# Patient Record
Sex: Female | Born: 1960 | Race: Black or African American | Hispanic: No | Marital: Married | Smoking: Never smoker
Health system: Southern US, Community
[De-identification: ages and names within clinical notes are randomized; demographics above are authoritative.]

## PROBLEM LIST (undated history)

## (undated) DIAGNOSIS — F419 Anxiety disorder, unspecified: Secondary | ICD-10-CM

## (undated) DIAGNOSIS — M1990 Unspecified osteoarthritis, unspecified site: Secondary | ICD-10-CM

## (undated) DIAGNOSIS — M722 Plantar fascial fibromatosis: Secondary | ICD-10-CM

## (undated) DIAGNOSIS — T884XXA Failed or difficult intubation, initial encounter: Secondary | ICD-10-CM

## (undated) DIAGNOSIS — I2699 Other pulmonary embolism without acute cor pulmonale: Secondary | ICD-10-CM

## (undated) DIAGNOSIS — K219 Gastro-esophageal reflux disease without esophagitis: Secondary | ICD-10-CM

## (undated) DIAGNOSIS — Z8489 Family history of other specified conditions: Secondary | ICD-10-CM

## (undated) DIAGNOSIS — R7303 Prediabetes: Secondary | ICD-10-CM

## (undated) DIAGNOSIS — F329 Major depressive disorder, single episode, unspecified: Secondary | ICD-10-CM

## (undated) DIAGNOSIS — E669 Obesity, unspecified: Secondary | ICD-10-CM

## (undated) DIAGNOSIS — I272 Pulmonary hypertension, unspecified: Secondary | ICD-10-CM

## (undated) DIAGNOSIS — K5792 Diverticulitis of intestine, part unspecified, without perforation or abscess without bleeding: Secondary | ICD-10-CM

## (undated) DIAGNOSIS — J189 Pneumonia, unspecified organism: Secondary | ICD-10-CM

## (undated) DIAGNOSIS — R0600 Dyspnea, unspecified: Secondary | ICD-10-CM

## (undated) DIAGNOSIS — Z973 Presence of spectacles and contact lenses: Secondary | ICD-10-CM

## (undated) DIAGNOSIS — Z95828 Presence of other vascular implants and grafts: Secondary | ICD-10-CM

## (undated) DIAGNOSIS — I509 Heart failure, unspecified: Secondary | ICD-10-CM

## (undated) DIAGNOSIS — F32A Depression, unspecified: Secondary | ICD-10-CM

## (undated) DIAGNOSIS — E785 Hyperlipidemia, unspecified: Secondary | ICD-10-CM

## (undated) DIAGNOSIS — I10 Essential (primary) hypertension: Secondary | ICD-10-CM

## (undated) DIAGNOSIS — D649 Anemia, unspecified: Secondary | ICD-10-CM

## (undated) DIAGNOSIS — G4730 Sleep apnea, unspecified: Secondary | ICD-10-CM

## (undated) HISTORY — PX: FRACTURE SURGERY: SHX138

## (undated) HISTORY — DX: Depression, unspecified: F32.A

## (undated) HISTORY — PX: HEMORROIDECTOMY: SUR656

## (undated) HISTORY — PX: COLONOSCOPY W/ BIOPSIES AND POLYPECTOMY: SHX1376

## (undated) HISTORY — PX: FOOT SURGERY: SHX648

## (undated) HISTORY — PX: ABDOMINAL HYSTERECTOMY: SHX81

## (undated) HISTORY — PX: WISDOM TOOTH EXTRACTION: SHX21

## (undated) HISTORY — PX: EMBOLECTOMY: SHX44

## (undated) HISTORY — PX: BREAST REDUCTION SURGERY: SHX8

## (undated) HISTORY — PX: REDUCTION MAMMAPLASTY: SUR839

## (undated) HISTORY — DX: Major depressive disorder, single episode, unspecified: F32.9

## (undated) HISTORY — PX: IVC FILTER INSERTION: CATH118245

## (undated) HISTORY — PX: KNEE ARTHROSCOPY W/ DEBRIDEMENT: SHX1867

## (undated) HISTORY — PX: CHOLECYSTECTOMY: SHX55

---

## 2015-11-12 ENCOUNTER — Emergency Department (HOSPITAL_COMMUNITY): Payer: Self-pay

## 2015-11-12 ENCOUNTER — Encounter (HOSPITAL_COMMUNITY): Payer: Self-pay | Admitting: *Deleted

## 2015-11-12 ENCOUNTER — Emergency Department (HOSPITAL_COMMUNITY)
Admission: EM | Admit: 2015-11-12 | Discharge: 2015-11-13 | Disposition: A | Discharge status: Home / Self Care | Payer: Self-pay | Attending: Emergency Medicine | Admitting: Emergency Medicine

## 2015-11-12 DIAGNOSIS — Z8739 Personal history of other diseases of the musculoskeletal system and connective tissue: Secondary | ICD-10-CM | POA: Insufficient documentation

## 2015-11-12 DIAGNOSIS — T45515A Adverse effect of anticoagulants, initial encounter: Secondary | ICD-10-CM

## 2015-11-12 DIAGNOSIS — Z8719 Personal history of other diseases of the digestive system: Secondary | ICD-10-CM | POA: Insufficient documentation

## 2015-11-12 DIAGNOSIS — F419 Anxiety disorder, unspecified: Secondary | ICD-10-CM | POA: Insufficient documentation

## 2015-11-12 DIAGNOSIS — Z7901 Long term (current) use of anticoagulants: Secondary | ICD-10-CM | POA: Insufficient documentation

## 2015-11-12 DIAGNOSIS — R079 Chest pain, unspecified: Secondary | ICD-10-CM

## 2015-11-12 DIAGNOSIS — I2699 Other pulmonary embolism without acute cor pulmonale: Secondary | ICD-10-CM | POA: Insufficient documentation

## 2015-11-12 DIAGNOSIS — Z8701 Personal history of pneumonia (recurrent): Secondary | ICD-10-CM | POA: Insufficient documentation

## 2015-11-12 DIAGNOSIS — E669 Obesity, unspecified: Secondary | ICD-10-CM | POA: Insufficient documentation

## 2015-11-12 DIAGNOSIS — Z7951 Long term (current) use of inhaled steroids: Secondary | ICD-10-CM | POA: Insufficient documentation

## 2015-11-12 DIAGNOSIS — Z79899 Other long term (current) drug therapy: Secondary | ICD-10-CM | POA: Insufficient documentation

## 2015-11-12 DIAGNOSIS — R42 Dizziness and giddiness: Secondary | ICD-10-CM | POA: Insufficient documentation

## 2015-11-12 HISTORY — DX: Unspecified osteoarthritis, unspecified site: M19.90

## 2015-11-12 HISTORY — DX: Other pulmonary embolism without acute cor pulmonale: I26.99

## 2015-11-12 HISTORY — DX: Diverticulitis of intestine, part unspecified, without perforation or abscess without bleeding: K57.92

## 2015-11-12 HISTORY — DX: Essential (primary) hypertension: I10

## 2015-11-12 HISTORY — DX: Obesity, unspecified: E66.9

## 2015-11-12 HISTORY — DX: Pneumonia, unspecified organism: J18.9

## 2015-11-12 LAB — BASIC METABOLIC PANEL
Anion gap: 10 (ref 5–15)
BUN: 18 mg/dL (ref 6–20)
CO2: 27 mmol/L (ref 22–32)
Calcium: 10.1 mg/dL (ref 8.9–10.3)
Chloride: 106 mmol/L (ref 101–111)
Creatinine, Ser: 1.11 mg/dL — ABNORMAL HIGH (ref 0.44–1.00)
GFR calc Af Amer: >60 mL/min (ref >60)
GFR calc non Af Amer: 56 mL/min — ABNORMAL LOW (ref >60)
Glucose, Bld: 98 mg/dL (ref 65–99)
Potassium: 4.1 mmol/L (ref 3.5–5.1)
Sodium: 143 mmol/L (ref 135–145)

## 2015-11-12 LAB — CBC
HCT: 36.8 % (ref 36.0–46.0)
Hemoglobin: 12 g/dL (ref 12.0–15.0)
MCH: 22.1 pg — ABNORMAL LOW (ref 26.0–34.0)
MCHC: 32.6 g/dL (ref 30.0–36.0)
MCV: 67.8 fL — ABNORMAL LOW (ref 78.0–100.0)
Platelets: 350 10*3/uL (ref 150–400)
RBC: 5.43 MIL/uL — ABNORMAL HIGH (ref 3.87–5.11)
RDW: 17.6 % — ABNORMAL HIGH (ref 11.5–15.5)
WBC: 9.2 10*3/uL (ref 4.0–10.5)

## 2015-11-12 LAB — PROTIME-INR
INR: 1.34 (ref 0.00–1.49)
Prothrombin Time: 16.7 seconds — ABNORMAL HIGH (ref 11.6–15.2)

## 2015-11-12 LAB — I-STAT TROPONIN, ED: Troponin i, poc: 0 ng/mL (ref 0.00–0.08)

## 2015-11-12 MED ORDER — ONDANSETRON HCL 4 MG/2ML IJ SOLN
4.0000 mg | INTRAMUSCULAR | Status: AC
  Administered 2015-11-12: 4 mg via INTRAVENOUS
  Filled 2015-11-12: qty 2

## 2015-11-12 MED ORDER — SODIUM CHLORIDE 0.9 % IV BOLUS (SEPSIS)
500.0000 mL | Freq: Once | INTRAVENOUS | Status: AC
  Administered 2015-11-12: 500 mL via INTRAVENOUS

## 2015-11-12 MED ORDER — ACETAMINOPHEN 325 MG PO TABS
650.0000 mg | ORAL_TABLET | Freq: Once | ORAL | Status: AC
  Administered 2015-11-12: 650 mg via ORAL
  Filled 2015-11-12: qty 2

## 2015-11-12 MED ORDER — MECLIZINE HCL 25 MG PO TABS
25.0000 mg | ORAL_TABLET | Freq: Once | ORAL | Status: AC
  Administered 2015-11-12: 25 mg via ORAL
  Filled 2015-11-12: qty 1

## 2015-11-12 NOTE — ED Notes (Signed)
Patient returned from XRay

## 2015-11-12 NOTE — ED Provider Notes (Signed)
CSN: [id number]     Arrival date & time 11/12/15  1639 History   First MD Initiated Contact with Patient 11/12/15 1730     Chief Complaint  Patient presents with  . Chest Pain  . Shortness of Breath   [name] [name] is a 55 y.o. female who presents to the emergency department complaining of feeling lightheaded and dizzy as well as worsening chest pain and shortness of breath or the past 4 days. The patient was diagnosed with bilateral PEs in January 2017 at [facility]. She has been taking Xarelto. She recently moved to the area. She reports she's been having some chronic right and left-sided chest pain. It is worse on her right side. She reports over the past 4 days this pain has worsened. She reports increased stress recently. No new chest pain only worsening chest pain. She reports feeling slightly short of breath and has had continued productive cough. No hemoptysis. She has been compliant on Xarelto. She reports her right-sided chest pain is worse with movement of her arms and lifting her body. Patient also reports having increasing lightheadedness and dizziness that is worse with position change. She reports she feels she cannot bend over without feeling very lightheaded. She reports feeling off balance when she is walking. No headache. She denies fevers, headache, changes to her vision, head injury, falls, abdominal pain, vomiting, diarrhea, numbness, tingling, weakness.    Patient is a 55 y.o. female presenting with chest pain and shortness of breath. The history is provided by the patient. No language interpreter was used.  Chest Pain Associated symptoms: cough, dizziness and shortness of breath   Associated symptoms: no abdominal pain, no back pain, no fever, no headache, no nausea, no numbness, no palpitations, not vomiting and no weakness   Shortness of Breath Associated symptoms: chest pain and cough   Associated symptoms: no abdominal pain, no fever, no headaches, no  neck pain, no rash, no sore throat, no vomiting and no wheezing     Past Medical History  Diagnosis Date  . Pulmonary embolism ([place])   . Hypertension   . Obesity   . Arthritis   . Pneumonia   . Diverticulitis    Past Surgical History  Procedure Laterality Date  . Cholecystectomy    . Breast reduction surgery    . Abdominal hysterectomy     History reviewed. No pertinent family history. Social History  Substance Use Topics  . Smoking status: Never Smoker   . Smokeless tobacco: None  . Alcohol Use: No   OB History    No data available     Review of Systems  Constitutional: Negative for fever and chills.  HENT: Negative for congestion and sore throat.   Eyes: Negative for visual disturbance.  Respiratory: Positive for cough and shortness of breath. Negative for wheezing.   Cardiovascular: Positive for chest pain. Negative for palpitations and leg swelling.  Gastrointestinal: Negative for nausea, vomiting, abdominal pain and diarrhea.  Genitourinary: Negative for dysuria, hematuria and difficulty urinating.  Musculoskeletal: Negative for back pain, neck pain and neck stiffness.  Skin: Negative for rash.  Neurological: Positive for dizziness and light-headedness. Negative for syncope, speech difficulty, weakness, numbness and headaches.      Allergies  Review of patient's allergies indicates no known allergies.  Home Medications   Prior to Admission medications   Medication Sig Start Date End Date Taking? Authorizing Provider  albuterol (PROVENTIL HFA;VENTOLIN HFA) 108 (90 Base) MCG/ACT inhaler Inhale 1-2 puffs into  the lungs every 6 (six) hours as needed for wheezing or shortness of breath.   Yes Historical Provider, MD  ALPRAZolam Duanne Moron) 1 MG tablet Take 1 mg by mouth 2 (two) times daily as needed for anxiety or sleep.   Yes Historical Provider, MD  atenolol (TENORMIN) 100 MG tablet Take 100 mg by mouth daily.   Yes Historical Provider, MD  budesonide-formoterol  (SYMBICORT) 160-4.5 MCG/ACT inhaler Inhale 2 puffs into the lungs 2 (two) times daily.   Yes Historical Provider, MD  dicyclomine (BENTYL) 20 MG tablet Take 20 mg by mouth 2 (two) times daily.   Yes Historical Provider, MD  docusate sodium (COLACE) 100 MG capsule Take 100 mg by mouth 2 (two) times daily.   Yes Historical Provider, MD  guaiFENesin-codeine (ROBITUSSIN AC) 100-10 MG/5ML syrup Take 5 mLs by mouth 3 (three) times daily as needed for cough or congestion.   Yes Historical Provider, MD  promethazine (PHENERGAN) 25 MG tablet Take 25 mg by mouth every 6 (six) hours as needed for nausea or vomiting.   Yes Historical Provider, MD  triamterene-hydrochlorothiazide (MAXZIDE-25) 37.5-25 MG tablet Take 1 tablet by mouth daily.   Yes Historical Provider, MD  zolpidem (AMBIEN) 10 MG tablet Take 10 mg by mouth at bedtime.   Yes Historical Provider, MD  meclizine (ANTIVERT) 25 MG tablet Take 1 tablet (25 mg total) by mouth 3 (three) times daily as needed for dizziness. 11/13/15   Waynetta Pean, PA-C  rivaroxaban (XARELTO) 20 MG TABS tablet Take 1 tablet (20 mg total) by mouth daily with supper. 11/13/15   Waynetta Pean, PA-C   BP 129/70 mmHg  Pulse 54  Temp(Src) 97.6 F (36.4 C) (Oral)  Resp 16  SpO2 95% Physical Exam  Constitutional: She is oriented to person, place, and time. She appears well-developed and well-nourished. No distress.  Nontoxic appearing. Obese female.  HENT:  Head: Normocephalic and atraumatic.  Mouth/Throat: Oropharynx is clear and moist.  Eyes: Conjunctivae and EOM are normal. Pupils are equal, round, and reactive to light. Right eye exhibits no discharge. Left eye exhibits no discharge.  Neck: Normal range of motion. Neck supple. No JVD present. No tracheal deviation present.  Cardiovascular: Normal rate, regular rhythm, normal heart sounds and intact distal pulses.  Exam reveals no gallop and no friction rub.   No murmur heard. Bilateral radial and posterior tibialis  pulses are intact.  Pulmonary/Chest: Effort normal and breath sounds normal. No respiratory distress. She has no wheezes. She has no rales. She exhibits tenderness.  Lungs are clear to auscultation bilaterally. Chest wall is tender to palpation and reproduces her chest pain.   Abdominal: Soft. Bowel sounds are normal. She exhibits no distension. There is no tenderness. There is no guarding.  Musculoskeletal: She exhibits no edema or tenderness.  No lower extremity edema or tenderness.  Lymphadenopathy:    She has no cervical adenopathy.  Neurological: She is alert and oriented to person, place, and time. No cranial nerve deficit. Coordination normal.  The patient is alert and oriented 3. Cranial nerves are intact. Sensation is intact her bilateral upper and lower extremities. 5/5 strength in her bilateral upper and lower extremities. No pronator drift. Speech is clear and coherent. EOMs are intact. Vision is grossly intact. Normal gait.  Skin: Skin is warm and dry. No rash noted. She is not diaphoretic. No erythema. No pallor.  Psychiatric: Her speech is normal and behavior is normal. Her mood appears anxious.  Patient is tearful at times during interview.  She reports she is stressed that she does not have insurance in about being sick.  Nursing note and vitals reviewed.   ED Course  Procedures (including critical care time) Labs Review Labs Reviewed  BASIC METABOLIC PANEL - Abnormal; Notable for the following:    Creatinine, Ser 1.11 (*)    GFR calc non Af Amer 56 (*)    All other components within normal limits  CBC - Abnormal; Notable for the following:    RBC 5.43 (*)    MCV 67.8 (*)    MCH 22.1 (*)    RDW 17.6 (*)    All other components within normal limits  PROTIME-INR - Abnormal; Notable for the following:    Prothrombin Time 16.7 (*)    All other components within normal limits  I-STAT TROPOININ, ED    Imaging Review Dg Chest 2 View  11/12/2015  CLINICAL DATA:   Right-sided pleuritic chest pain shortness of breath for 4 days. Productive cough. Previous pulmonary embolism. Hypertension. EXAM: CHEST  2 VIEW COMPARISON:  None. FINDINGS: The heart size and mediastinal contours are within normal limits. Mild right middle lobe scarring or atelectasis is noted. No evidence of pulmonary consolidation or edema. No evidence of pneumothorax or pleural effusion. Right-sided Port-A-Cath is seen in appropriate position with tip overlying the superior cavoatrial junction. The visualized skeletal structures are unremarkable. IMPRESSION: Mild right middle lobe scarring versus atelectasis. Electronically Signed   By: Earle Gell M.D.   On: 11/12/2015 18:33   Ct Head Wo Contrast  11/12/2015  CLINICAL DATA:  Recent diagnosis of pulmonary embolus. Dizziness and lightheadedness. EXAM: CT HEAD WITHOUT CONTRAST TECHNIQUE: Contiguous axial images were obtained from the base of the skull through the vertex without intravenous contrast. COMPARISON:  None. FINDINGS: No acute cortical infarct, hemorrhage, or mass lesion ispresent. Ventricles are of normal size. No significant extra-axial fluid collection is present. The paranasal sinuses andmastoid air cells are clear. The osseous skull is intact. IMPRESSION: Negative exam. Electronically Signed   By: Kerby Moors M.D.   On: 11/12/2015 18:55   I have personally reviewed and evaluated these images and lab results as part of my medical decision-making.   EKG Interpretation   Date/Time:  Sunday November 12 2015 16:45:36 EST Ventricular Rate:  72 PR Interval:  166 QRS Duration: 72 QT Interval:  394 QTC Calculation: 431 R Axis:   41 Text Interpretation:  Normal sinus rhythm Nonspecific T wave abnormality  Abnormal ECG Confirmed by Alvino Chapel  MD, NATHAN [phone]) on 11/13/2015  1:09:04 AM      Filed Vitals:   11/12/15 2349 11/12/15 2352 11/13/15 0000 11/13/15 0015  BP: 125/86 156/96  129/70  Pulse: 66 65 55 54  Temp:      TempSrc:       Resp: 20 12 16 16   SpO2: 98% 95% 100% 95%     MDM   Meds given in ED:  Medications  sodium chloride 0.9 % bolus 500 mL (0 mLs Intravenous Stopped 11/12/15 2052)  meclizine (ANTIVERT) tablet 25 mg (25 mg Oral Given 11/12/15 1946)  acetaminophen (TYLENOL) tablet 650 mg (650 mg Oral Given 11/12/15 1946)  ondansetron (ZOFRAN) injection 4 mg (4 mg Intravenous Given 11/12/15 2055)  sodium chloride 0.9 % bolus 500 mL (0 mLs Intravenous Stopped 11/12/15 2347)  sodium chloride 0.9 % bolus 500 mL (0 mLs Intravenous Stopped 11/13/15 0056)    New Prescriptions   MECLIZINE (ANTIVERT) 25 MG TABLET    Take 1 tablet (25 mg  total) by mouth 3 (three) times daily as needed for dizziness.   RIVAROXABAN (XARELTO) 20 MG TABS TABLET    Take 1 tablet (20 mg total) by mouth daily with supper.    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 female who presents to the emergency department complaining of feeling lightheaded and dizzy as well as worsening chest pain and shortness of breath or the past 4 days. The patient was diagnosed with bilateral PEs in January 2017 at [facility]. She has been taking Xarelto. She recently moved to the area. She reports she's been having some chronic right and left-sided chest pain. It is worse on her right side. She reports over the past 4 days this pain has worsened. She reports increased stress recently. No new chest pain only worsening chest pain. She reports feeling slightly short of breath and has had continued productive cough. No hemoptysis. She has been compliant on Xarelto. She reports her right-sided chest pain is worse with movement of her arms and lifting her body. Patient also reports having increasing lightheadedness and dizziness that is worse with position change. She reports she feels she cannot bend over without feeling very lightheaded. On exam  the patient is afebrile and nontoxic appearing. Her lungs are clear to auscultation bilaterally. She has chest wall tenderness to palpation which reproduces her chest pain. She has no focal neurological deficits. Patient is tearful at times. She reports increased stress about her medical care. She is soon to be out of her Xarelto and does not have primary care or new prescription. She's been taking 15 mg twice a day for more than a month now. I believe the patient should be on 20 mg once daily. This 15 mg BID dosing was started by the hospital in [facility]. Troponin is 0. No need for delta troponin is a patient has been having chronic ongoing chest pain that is unchanged from her PE diagnosis. BMP is remarkable only for creatinine of 1.11 with a GFR greater than 60. CBC is unremarkable. CT head is unremarkable. Chest x-ray shows right middle lobe scarring versus atelectasis. I suspect this is related to the patient's chronic PE. Patient's oxygen saturation is 100% on room air. She is not tachypneic or tachycardic. After a discussion with my attending, we see no need for repeat CT A at this time with her chronic on going chest pain while on xarelto. After fluid bolus and meclizine patient reported feeling much better. She is ambulatory with normal gait the bathroom several times. She is not orthostatic. We suspect vertigo today. Will discharge with prescription for meclizine. I left a message with care management to help her get set up at [facility] and to help her fill her prescriptions for Xarelto. I provided her with a prescription for Xarelto 20 mg as the patient reports she is soon to run out of her previous prescription. I advised her to call the wellness Center for follow-up. I discussed strict and specific return precautions. I advised the patient to follow-up with their primary care provider this week. I advised the patient to return to the emergency department with new or worsening symptoms or  new concerns. The patient verbalized understanding and agreement with plan.    This patient was discussed with Dr. Rogene Houston who agrees with assessment and plan.    Waynetta Pean, PA-C 11/13/15 0118  Fredia Sorrow,  MD 11/15/15 [phone]

## 2015-11-12 NOTE — ED Notes (Signed)
Power[employer] access kit ordered to access port.

## 2015-11-12 NOTE — ED Notes (Addendum)
Pt reports chest pain, sob and dizziness that has been getting worse x 4 days. Pt reports recently being diagnosed with PEs. Ekg done and airway intact. Pt has port and wants it accessed for blood work.

## 2015-11-13 MED ORDER — RIVAROXABAN 20 MG PO TABS: 20.0000 mg | ORAL_TABLET | Freq: Every day | ORAL | Status: DC

## 2015-11-13 MED ORDER — HEPARIN SOD (PORK) LOCK FLUSH 100 UNIT/ML IV SOLN
500.0000 [IU] | Freq: Once | INTRAVENOUS | Status: AC
  Administered 2015-11-13: 500 [IU]
  Filled 2015-11-13: qty 5

## 2015-11-13 MED ORDER — MECLIZINE HCL 25 MG PO TABS: 25.0000 mg | ORAL_TABLET | Freq: Three times a day (TID) | ORAL | Status: DC | PRN

## 2015-11-13 NOTE — Discharge Instructions (Signed)
Benign Positional Vertigo °Vertigo is the feeling that you or your surroundings are moving when they are not. Benign positional vertigo is the most common form of vertigo. The cause of this condition is not serious (is benign). This condition is triggered by certain movements and positions (is positional). This condition can be dangerous if it occurs while you are doing something that could endanger you or others, such as driving.  °CAUSES °In many cases, the cause of this condition is not known. It may be caused by a disturbance in an area of the inner ear that helps your brain to sense movement and balance. This disturbance can be caused by a viral infection (labyrinthitis), head injury, or repetitive motion. °RISK FACTORS °This condition is more likely to develop in: °· Women. °· People who are 50 years of age or older. °SYMPTOMS °Symptoms of this condition usually happen when you move your head or your eyes in different directions. Symptoms may start suddenly, and they usually last for less than a minute. Symptoms may include: °· Loss of balance and falling. °· Feeling like you are spinning or moving. °· Feeling like your surroundings are spinning or moving. °· Nausea and vomiting. °· Blurred vision. °· Dizziness. °· Involuntary eye movement (nystagmus). °Symptoms can be mild and cause only slight annoyance, or they can be severe and interfere with daily life. Episodes of benign positional vertigo may return (recur) over time, and they may be triggered by certain movements. Symptoms may improve over time. °DIAGNOSIS °This condition is usually diagnosed by medical history and a physical exam of the head, neck, and ears. You may be referred to a health care provider who specializes in ear, nose, and throat (ENT) problems (otolaryngologist) or a provider who specializes in disorders of the nervous system (neurologist). You may have additional testing, including: °· MRI. °· A CT scan. °· Eye movement tests. Your  health care provider may ask you to change positions quickly while he or she watches you for symptoms of benign positional vertigo, such as nystagmus. Eye movement may be tested with an electronystagmogram (ENG), caloric stimulation, the Dix-Hallpike test, or the roll test. °· An electroencephalogram (EEG). This records electrical activity in your brain. °· Hearing tests. °TREATMENT °Usually, your health care provider will treat this by moving your head in specific positions to adjust your inner ear back to normal. Surgery may be needed in severe cases, but this is rare. In some cases, benign positional vertigo may resolve on its own in 2-4 weeks. °HOME CARE INSTRUCTIONS °Safety °· Move slowly. Avoid sudden body or head movements. °· Avoid driving. °· Avoid operating heavy machinery. °· Avoid doing any tasks that would be dangerous to you or others if a vertigo episode would occur. °· If you have trouble walking or keeping your balance, try using a cane for stability. If you feel dizzy or unstable, sit down right away. °· Return to your normal activities as told by your health care provider. Ask your health care provider what activities are safe for you. °General Instructions °· Take over-the-counter and prescription medicines only as told by your health care provider. °· Avoid certain positions or movements as told by your health care provider. °· Drink enough fluid to keep your urine clear or pale yellow. °· Keep all follow-up visits as told by your health care provider. This is important. °SEEK MEDICAL CARE IF: °· You have a fever. °· Your condition gets worse or you develop new symptoms. °· Your family or friends   notice any behavioral changes.  Your nausea or vomiting gets worse.  You have numbness or a "pins and needles" sensation. SEEK IMMEDIATE MEDICAL CARE IF:  You have difficulty speaking or moving.  You are always dizzy.  You faint.  You develop severe headaches.  You have weakness in your  legs or arms.  You have changes in your hearing or vision.  You develop a stiff neck.  You develop sensitivity to light.   This information is not intended to replace advice given to you by your health care provider. Make sure you discuss any questions you have with your health care provider.   Document Released: 06/03/2006 Document Revised: 05/17/2015 Document Reviewed: 12/19/2014 Elsevier Interactive Patient Education 2016 [employer] Pulmonary Embolism A pulmonary embolism (PE) is a sudden blockage or decrease of blood flow in one lung or both lungs. Most blockages come from a blood clot that travels from the legs or the pelvis to the lungs. PE is a dangerous and potentially life-threatening condition if it is not treated right away. CAUSES A pulmonary embolism occurs most commonly when a blood clot travels from one of your veins to your lungs. Rarely, PE is caused by air, fat, amniotic fluid, or part of a tumor traveling through your veins to your lungs. RISK FACTORS A PE is more likely to develop in:  People who smoke.  People who areolder, especially over 8 years of age.  People who are overweight (obese).  People who sit or lie still for a long time, such as during long-distance travel (over 4 hours), bed rest, hospitalization, or during recovery from certain medical conditions like a stroke.  People who do not engage in much physical activity (sedentary lifestyle).  People who have chronic breathing disorders.  People whohave a personal or family history of blood clots or blood clotting disease.  People whohave peripheral vascular disease (PVD), diabetes, or some types of cancer.  People who haveheart disease, especially if the person had a recent heart attack or has congestive heart failure.  People who have neurological diseases that affect the legs (leg paresis).  People who have had a traumatic injury, such as breaking a hip or leg.  People whohave  recently had major or lengthy surgery, especially on the hip, knee, or abdomen.  People who have hada central line placed inside a large vein.  People who takemedicines that contain the hormone estrogen. These include birth control pills and hormone replacement therapy.  Pregnancy or during childbirth or the postpartum period. SIGNS AND SYMPTOMS  The symptoms of a PE usually start suddenly and include:  Shortness of breath while active or at rest.  Coughing or coughing up blood or blood-tinged mucus.  Chest pain that is often worse with deep breaths.  Rapid or irregular heartbeat.  Feeling light-headed or dizzy.  Fainting.  Feelinganxious.  Sweating. There may also be pain and swelling in a leg if that is where the blood clot started. These symptoms may represent a serious problem that is an emergency. Do not wait to see if the symptoms will go away. Get medical help right away. Call your local emergency services (911 in the [place] Do not drive yourself to the hospital. DIAGNOSIS Your health care provider will take a medical history and perform a physical exam. You may also have other tests, including:  Blood tests to assess the clotting properties of your blood, assess oxygen levels in your blood, and find blood clots.  Imaging tests, such as CT,  ultrasound, MRI, X-ray, and other tests to see if you have clots anywhere in your body.  An electrocardiogram (ECG) to look for heart strain from blood clots in the lungs. TREATMENT The main goals of PE treatment are:  To stop a blood clot from growing larger.  To stop new blood clots from forming. The type of treatment that you receive depends on many factors, such as the cause of your PE, your risk for bleeding or developing more clots, and other medical conditions that you have. Sometimes, a combination of treatments is necessary. This condition may be treated with:  Medicines, including newer oral blood thinners  (anticoagulants), warfarin, low molecular weight heparins, thrombolytics, or heparins.  Wearing compression stockings or using different types of devices.  Surgery (rare) to remove the blood clot or to place a filter in your abdomen to stop the blood clot from traveling to your lungs. Treatments for a PE are often divided into immediate treatment, long-term treatment (up to 3 months after PE), and extended treatment (more than 3 months after PE). Your treatment may continue for several months. This is called maintenance therapy, and it is used to prevent the forming of new blood clots. You can work with your health care provider to choose the treatment program that is best for you. What are anticoagulants? Anticoagulants are medicines that treat PEs. They can stop current blood clots from growing and stop new clots from forming. They cannot dissolve existing clots. Your body dissolves clots by itself over time. Anticoagulants are given by mouth, by injection, or through an IV tube. What are thrombolytics? Thrombolytics are clot-dissolving medicines that are used to dissolve a PE. They carry a high risk of bleeding, so they tend to be used only in severe cases or if you have very low blood pressure. HOME CARE INSTRUCTIONS If you are taking a newer oral anticoagulant:  Take the medicine every single day at the same time each day.  Understand what foods and drugs interact with this medicine.  Understand that there are no regular blood tests required when using this medicine.  Understandthe side effects of this medicine, including excessive bruising or bleeding. Ask your health care provider or pharmacist about other possible side effects. If you are taking warfarin:  Understand how to take warfarin and know which foods can affect how warfarin works in Veterinary surgeon.  Understand that it is dangerous to taketoo much or too little warfarin. Too much warfarin increases the risk of bleeding. Too  little warfarin continues to allow the risk for blood clots.  Follow your PT and INR blood testing schedule. The PT and INR results allow your health care provider to adjust your dose of warfarin. It is very important that you have your PT and INR tested as often as told by your health care provider.  Avoid major changes in your diet, or tell your health care provider before you change your diet. Arrange a visit with a registered dietitian to answer your questions. Many foods, especially foods that are high in vitamin K, can interfere with warfarin and affect the PT and INR results. Eat a consistent amount of foods that are high in vitamin K, such as:  Spinach, kale, broccoli, cabbage, collard greens, turnip greens, Brussels sprouts, peas, cauliflower, seaweed, and parsley.  Beef liver and pork liver.  Green tea.  Soybean oil.  Tell your health care provider about any and all medicines, vitamins, and supplements that you take, including aspirin and other over-the-counter anti-inflammatory  medicines. Be especially cautious with aspirin and anti-inflammatory medicines. Do not take those before you ask your health care provider if it is safe to do so. This is important because many medicines can interfere with warfarin and affect the PT and INR results.  Do not start or stop taking any over-the-counter or prescription medicine unless your health care provider or pharmacist tells you to do so. If you take warfarin, you will also need to do these things:  Hold pressure over cuts for longer than usual.  Tell your dentist and other health care providers that you are taking warfarin before you have any procedures in which bleeding may occur.  Avoid alcohol or drink very small amounts. Tell your health care provider if you change your alcohol intake.  Do not use tobacco products, including cigarettes, chewing tobacco, and e-cigarettes. If you need help quitting, ask your health care  provider.  Avoid contact sports. General Instructions  Take over-the-counter and prescription medicines only as told by your health care provider. Anticoagulant medicines can have side effects, including easy bruising and difficulty stopping bleeding. If you are prescribed an anticoagulant, you will also need to do these things:  Hold pressure over cuts for longer than usual.  Tell your dentist and other health care providers that you are taking anticoagulants before you have any procedures in which bleeding may occur.  Avoid contact sports.  Wear a medical alert bracelet or carry a medical alert card that says you have had a PE.  Ask your health care provider how soon you can go back to your normal activities. Stay active to prevent new blood clots from forming.  Make sure to exercise while traveling or when you have been sitting or standing for a long period of time. It is very important to exercise. Exercise your legs by walking or by tightening and relaxing your leg muscles often. Take frequent walks.  Wear compression stockings as told by your health care provider to help prevent more blood clots from forming.  Do not use tobacco products, including cigarettes, chewing tobacco, and e-cigarettes. If you need help quitting, ask your health care provider.  Keep all follow-up appointments with your health care provider. This is important. PREVENTION Take these actions to decrease your risk of developing another PE:  Exercise regularly. For at least 30 minutes every day, engage in:  Activity that involves moving your arms and legs.  Activity that encourages good blood flow through your body by increasing your heart rate.  Exercise your arms and legs every hour during long-distance travel (over 4 hours). Drink plenty of water and avoid drinking alcohol while traveling.  Avoid sitting or lying in bed for long periods of time without moving your legs.  Maintain a weight that is  appropriate for your height. Ask your health care provider what weight is healthy for you.  If you are a woman who is over 75 years of age, avoid unnecessary use of medicines that contain estrogen. These include birth control pills.  Do not smoke, especially if you take estrogen medicines. If you need help quitting, ask your health care provider.  If you are at very high risk for PE, wear compression stockings.  If you recently had a PE, have regularly scheduled ultrasound testing on your legs to check for new blood clots. If you are hospitalized, prevention measures may include:  Early walking after surgery, as soon as your health care provider says that it is safe.  Receiving anticoagulants to  prevent blood clots. If you cannot take anticoagulants, other options may be available, such as wearing compression stockings or using different types of devices. SEEK IMMEDIATE MEDICAL CARE IF:  You have new or increased pain, swelling, or redness in an arm or leg.  You have numbness or tingling in an arm or leg.  You have shortness of breath while active or at rest.  You have chest pain.  You have a rapid or irregular heartbeat.  You feel light-headed or dizzy.  You cough up blood.  You notice blood in your vomit, bowel movement, or urine.  You have a fever. These symptoms may represent a serious problem that is an emergency. Do not wait to see if the symptoms will go away. Get medical help right away. Call your local emergency services (911 in the [place] Do not drive yourself to the hospital.   This information is not intended to replace advice given to you by your health care provider. Make sure you discuss any questions you have with your health care provider.   Document Released: 08/23/2000 Document Revised: 05/17/2015 Document Reviewed: 12/21/2014 Elsevier Interactive Patient Education 2016 Elsevier Inc. Nonspecific Chest Pain  Chest pain can be caused by many different  conditions. There is always a chance that your pain could be related to something serious, such as a heart attack or a blood clot in your lungs. Chest pain can also be caused by conditions that are not life-threatening. If you have chest pain, it is very important to follow up with your health care provider. CAUSES  Chest pain can be caused by:  Heartburn.  Pneumonia or bronchitis.  Anxiety or stress.  Inflammation around your heart (pericarditis) or lung (pleuritis or pleurisy).  A blood clot in your lung.  A collapsed lung (pneumothorax). It can develop suddenly on its own (spontaneous pneumothorax) or from trauma to the chest.  Shingles infection (varicella-zoster virus).  Heart attack.  Damage to the bones, muscles, and cartilage that make up your chest wall. This can include:  Bruised bones due to injury.  Strained muscles or cartilage due to frequent or repeated coughing or overwork.  Fracture to one or more ribs.  Sore cartilage due to inflammation (costochondritis). RISK FACTORS  Risk factors for chest pain may include:  Activities that increase your risk for trauma or injury to your chest.  Respiratory infections or conditions that cause frequent coughing.  Medical conditions or overeating that can cause heartburn.  Heart disease or family history of heart disease.  Conditions or health behaviors that increase your risk of developing a blood clot.  Having had chicken pox (varicella zoster). SIGNS AND SYMPTOMS Chest pain can feel like:  Burning or tingling on the surface of your chest or deep in your chest.  Crushing, pressure, aching, or squeezing pain.  Dull or sharp pain that is worse when you move, cough, or take a deep breath.  Pain that is also felt in your back, neck, shoulder, or arm, or pain that spreads to any of these areas. Your chest pain may come and go, or it may stay constant. DIAGNOSIS Lab tests or other studies may be needed to find the  cause of your pain. Your health care provider may have you take a test called an ambulatory ECG (electrocardiogram). An ECG records your heartbeat patterns at the time the test is performed. You may also have other tests, such as:  Transthoracic echocardiogram (TTE). During echocardiography, sound waves are used to create a picture  of all of the heart structures and to look at how blood flows through your heart.  Transesophageal echocardiogram (TEE).This is a more advanced imaging test that obtains images from inside your body. It allows your health care provider to see your heart in finer detail.  Cardiac monitoring. This allows your health care provider to monitor your heart rate and rhythm in real time.  Holter monitor. This is a portable device that records your heartbeat and can help to diagnose abnormal heartbeats. It allows your health care provider to track your heart activity for several days, if needed.  Stress tests. These can be done through exercise or by taking medicine that makes your heart beat more quickly.  Blood tests.  Imaging tests. TREATMENT  Your treatment depends on what is causing your chest pain. Treatment may include:  Medicines. These may include:  Acid blockers for heartburn.  Anti-inflammatory medicine.  Pain medicine for inflammatory conditions.  Antibiotic medicine, if an infection is present.  Medicines to dissolve blood clots.  Medicines to treat coronary artery disease.  Supportive care for conditions that do not require medicines. This may include:  Resting.  Applying heat or cold packs to injured areas.  Limiting activities until pain decreases. HOME CARE INSTRUCTIONS  If you were prescribed an antibiotic medicine, finish it all even if you start to feel better.  Avoid any activities that bring on chest pain.  Do not use any tobacco products, including cigarettes, chewing tobacco, or electronic cigarettes. If you need help quitting,  ask your health care provider.  Do not drink alcohol.  Take medicines only as directed by your health care provider.  Keep all follow-up visits as directed by your health care provider. This is important. This includes any further testing if your chest pain does not go away.  If heartburn is the cause for your chest pain, you may be told to keep your head raised (elevated) while sleeping. This reduces the chance that acid will go from your stomach into your esophagus.  Make lifestyle changes as directed by your health care provider. These may include:  Getting regular exercise. Ask your health care provider to suggest some activities that are safe for you.  Eating a heart-healthy diet. A registered dietitian can help you to learn healthy eating options.  Maintaining a healthy weight.  Managing diabetes, if necessary.  Reducing stress. SEEK MEDICAL CARE IF:  Your chest pain does not go away after treatment.  You have a rash with blisters on your chest.  You have a fever. SEEK IMMEDIATE MEDICAL CARE IF:   Your chest pain is worse.  You have an increasing cough, or you cough up blood.  You have severe abdominal pain.  You have severe weakness.  You faint.  You have chills.  You have sudden, unexplained chest discomfort.  You have sudden, unexplained discomfort in your arms, back, neck, or jaw.  You have shortness of breath at any time.  You suddenly start to sweat, or your skin gets clammy.  You feel nauseous or you vomit.  You suddenly feel light-headed or dizzy.  Your heart begins to beat quickly, or it feels like it is skipping beats. These symptoms may represent a serious problem that is an emergency. Do not wait to see if the symptoms will go away. Get medical help right away. Call your local emergency services (911 in the [place] Do not drive yourself to the hospital.   This information is not intended to replace advice given  to you by your health care  provider. Make sure you discuss any questions you have with your health care provider.   Document Released: 06/05/2005 Document Revised: 09/16/2014 Document Reviewed: 04/01/2014 Elsevier Interactive Patient Education [employer].

## 2015-11-15 ENCOUNTER — Other Ambulatory Visit: Payer: Self-pay | Admitting: *Deleted

## 2015-11-15 ENCOUNTER — Encounter: Payer: Self-pay | Admitting: Family Medicine

## 2015-11-15 ENCOUNTER — Ambulatory Visit: Payer: Self-pay | Attending: Family Medicine | Admitting: Family Medicine

## 2015-11-15 ENCOUNTER — Ambulatory Visit (HOSPITAL_BASED_OUTPATIENT_CLINIC_OR_DEPARTMENT_OTHER): Payer: Self-pay | Admitting: Clinical

## 2015-11-15 VITALS — BP 118/76 | HR 76 | Temp 98.3°F | Resp 20 | Ht 65.0 in | Wt 316.4 lb

## 2015-11-15 DIAGNOSIS — R0602 Shortness of breath: Secondary | ICD-10-CM | POA: Insufficient documentation

## 2015-11-15 DIAGNOSIS — F418 Other specified anxiety disorders: Secondary | ICD-10-CM

## 2015-11-15 DIAGNOSIS — M25561 Pain in right knee: Secondary | ICD-10-CM | POA: Insufficient documentation

## 2015-11-15 DIAGNOSIS — F329 Major depressive disorder, single episode, unspecified: Secondary | ICD-10-CM | POA: Insufficient documentation

## 2015-11-15 DIAGNOSIS — R05 Cough: Secondary | ICD-10-CM | POA: Insufficient documentation

## 2015-11-15 DIAGNOSIS — Z8701 Personal history of pneumonia (recurrent): Secondary | ICD-10-CM | POA: Insufficient documentation

## 2015-11-15 DIAGNOSIS — M25562 Pain in left knee: Secondary | ICD-10-CM | POA: Insufficient documentation

## 2015-11-15 DIAGNOSIS — Z7902 Long term (current) use of antithrombotics/antiplatelets: Secondary | ICD-10-CM | POA: Insufficient documentation

## 2015-11-15 DIAGNOSIS — F32A Depression, unspecified: Secondary | ICD-10-CM | POA: Insufficient documentation

## 2015-11-15 DIAGNOSIS — I2699 Other pulmonary embolism without acute cor pulmonale: Secondary | ICD-10-CM

## 2015-11-15 DIAGNOSIS — I10 Essential (primary) hypertension: Secondary | ICD-10-CM | POA: Insufficient documentation

## 2015-11-15 DIAGNOSIS — Z86711 Personal history of pulmonary embolism: Secondary | ICD-10-CM | POA: Insufficient documentation

## 2015-11-15 DIAGNOSIS — R42 Dizziness and giddiness: Secondary | ICD-10-CM | POA: Insufficient documentation

## 2015-11-15 MED ORDER — MECLIZINE HCL 25 MG PO TABS: 25.0000 mg | ORAL_TABLET | Freq: Three times a day (TID) | ORAL | Status: DC | PRN

## 2015-11-15 MED ORDER — RIVAROXABAN 20 MG PO TABS: 20.0000 mg | ORAL_TABLET | Freq: Every day | ORAL | Status: DC

## 2015-11-15 MED FILL — TRAVEL SICKNESS 25 MG TAB C: 25 | 10 days supply | Qty: 30 | Fill #0

## 2015-11-15 MED FILL — XARELTO 20 MG TABLET: 20 | 30 days supply | Qty: 30 | Fill #0

## 2015-11-15 NOTE — Patient Instructions (Signed)
It was a pleasure seeing you today.   As we discussed, I believe your shortness of breath may be left over from your recent diagnosis of bilateral pulmonary embolism in late January.  I am sending a prescription for XARELTO 20mg  tablets, take 1 tablet by mouth once daily, to the [facility].   For the dizziness, you may take meclizine 25mg  by mouth every 6 to 8 hours as needed; this medicine may cause drowsiness.   I would like to have you back in 2 weeks for follow-up; we will ask you to sign a release form for records from [facility] where you were in January.   PLEASE HAVE PATIENT SIGN THE ROI FOR [facility] WHERE SHE WAS HOSPITALIZED IN JAN 2017, DISCHARGE SUMMARY AND IMAGING REPORTS, LAB REPORTS.

## 2015-11-15 NOTE — Progress Notes (Signed)
ASSESSMENT: Pt currently experiencing symptoms of anxiety and depression. Pt needs to f/u with PCP and [facility]; would benefit from psychoeducation and brief therapeutic interventions regarding coping with symptoms of anxiety and depression. Stage of Change: contemplative  PLAN: 1. F/U with behavioral health consultant in two weeks 2. Psychiatric Medications: Ambien. 3. Behavioral recommendation(s):   -Set up appointment with financial counseling at [facility] -Practice daily breathing exercises, as practiced in office visit -Remember importance of self-care -Consider reading educational materials regarding coping with anxiety and depression -Take home food resources today SUBJECTIVE: Pt. referred by Dr Lindell Noe for symptoms of anxiety and depression:  Pt. reports the following symptoms/concerns: Pt states that she can feel her depression coming back, was treated "many years ago", after 3 day old daughter passed in her arms over 20 years ago; primary concern is feeling overwhelmed with "stress" at changing health condition, family issues, recent move from another county, and worry over being uninsured and food insecure. Pt does not want her family to know she is depressed, and feels it helps "a lot" to talk about it to someone. Duration of problem: Two weeks Severity: moderate-severe  OBJECTIVE: Orientation & Cognition: Oriented x3. Thought processes normal and appropriate to situation. Mood: teary. Affect: appropriate Appearance: appropriate Risk of harm to self or others: no known risk of harm to self or others, no SI Substance use: none Assessments administered: PHQ9: 17/ GAD7: 18  Diagnosis: Anxiety and depression (*assess further at next visit) CPT Code: F41.8 -------------------------------------------- Other(s) present in the room: none  Time spent with patient in exam room: 30 minutes, 4-4:30pm   Depression screen [facility] 2/9 11/15/2015 11/15/2015  Decreased Interest 0 0  Down, Depressed,  Hopeless 2 0  PHQ - 2 Score 2 0  Altered sleeping 3 -  Tired, decreased energy 3 -  Change in appetite 2 -  Feeling bad or failure about yourself  3 -  Trouble concentrating 3 -  Moving slowly or fidgety/restless 1 -  Suicidal thoughts 0 -  PHQ-9 Score 17 -    GAD7: 18(3,3,3,3,1,3,2)

## 2015-11-15 NOTE — Assessment & Plan Note (Signed)
diagnosed with B/L PE in Jan 2017 in [place].  Initially started on warfarin but changed to Xarelto.   Patient continues with some dyspnea and sputum production, worse with exertion. Recent ER visit with CXR without apparent infiltrates; no fevers or leukocytosis.  Suspect related to recent bilateral PEs.  May also consider ECHO to evaluate for R heart strain/Pulm HTN, or other causes.  Requesting D/C summary from [facility] where she was hospitalized Jan 26th before ordering further tests.

## 2015-11-15 NOTE — Progress Notes (Signed)
Patient is here for HFU for Chest Pain  Patient complains of chest pain being present on the right side with breathing in and out. Patient complains of headache on the right side being present.   Patient also complains of productive cough with thick white phlegm.

## 2015-11-15 NOTE — Addendum Note (Signed)
Addended by: Willeen Niece on: 11/15/2015 05:15 PM   Modules accepted: Orders

## 2015-11-15 NOTE — Progress Notes (Signed)
Subjective:    Patient ID: [name] [name], female    DOB: [birth year], 55 y.o.   MRN: [id number]  HPI  In January 26th, 2017 was admitted to hospital in [place] ([place] for short of breath, [place] [facility] and diagnosed with PE bilaterally.  Started initially on Lovenox and warfarin, however outpatient follow up visit started on Xarelto. Was unsure of dosing, was given samples of 15mg  tablets but unsure whether taking as directed (thinks she had been taking 15mg  BID).   Since that time, she has continued to have dyspnea and cough productive of white sputum.  Has felt somewhat dizzy since, worse with position change and after exerting herself.    Of note, patient moved to [place] from [place] 1 month ago, to be closer to son who lives here in town.  She is still learning her way around. She is very uncomfortable with the transition; has no insurance and is very scared about the medical costs of her care.  Also states that she 'doesnt want to be a burden on anyone'.  Feels that she has some of the feelings of prior diagnosis of depression.    PMHx; Bilat knee pain with meniscal injury.  Walks with cane.  HTN history, has had cardiac cath in the past. Of note, patient with port indwelling but has never received treatments/infusion; is vague about history of the port.  She reports no cancer history.   Social Hx; never smoker. Husband disabled. Patient has been declared disabled and will receive Medicare A in April 2017. Son in [place]. A daughter passed away in the past.   Prior diagnoses of PNA x2 in the past.    Review of Systems  Denies fevers, no ear pain; chest pain and dyspnea/cough unchanged since hospitalization in Jan 2017. No N/V/D; has lost weight intentionally.  No night sweats. Some epistaxis since starting anticoagulation in January. No falls. Is not driving out of precautions for the dizziness.      Objective:   Physical Exam Obese, alert, speaking in fluid sentences, no  increased work of breathing. No active distress, but emotionally labile during history.  HEENT Neck supple, EOMI. PERRL. No nystagmus with EOM testing. TMs clear bilat. Nasal mucosa clear. Clear oropharynx with moist mucus membranes. No cervical adenopathy, supple thyroid.  COR Regular S1S2, no extra sounds PULM Clear bilat. No rales or wheezes, some scant diffuse rhonchi appreciated.        Assessment & Plan:  Bilateral PEs; Was diagnosed with B/L PE in Jan 2017 in [place].  Initially started on warfarin but changed to Xarelto.   Patient continues with some dyspnea and sputum production, worse with exertion. Recent ER visit with CXR without apparent infiltrates; no fevers or leukocytosis.  Suspect related to recent bilateral PEs.  May also consider ECHO to evaluate for R heart strain/Pulm HTN, or other causes.  Requesting D/C summary from [facility] where she was hospitalized Jan 26th before ordering further tests (ECHO, CT chest, spirometry).   Dizziness: somewhat vague in her description; had CT brain in ED on 11/12/15 that was unremarkable. Meclizine today, increased hydration. Hold antihypertensives in light of excellent BP today (she admits she is not taking the meds because she cannot afford them).   Depression: Patient with history depression, requesting input of CSW for resources. Suspect largely due to adjustment (she mentions lack of insurance, being in a new city, depending on others, etc as triggers).   Follow up 2 weeks.  Dalbert Mayotte, MD

## 2015-11-20 ENCOUNTER — Encounter: Payer: Self-pay | Admitting: Internal Medicine

## 2015-11-20 ENCOUNTER — Ambulatory Visit: Payer: Self-pay | Attending: Internal Medicine | Admitting: Internal Medicine

## 2015-11-20 VITALS — BP 136/75 | HR 83 | Temp 98.0°F | Resp 14 | Ht 65.0 in | Wt 318.8 lb

## 2015-11-20 DIAGNOSIS — K921 Melena: Secondary | ICD-10-CM | POA: Insufficient documentation

## 2015-11-20 DIAGNOSIS — N939 Abnormal uterine and vaginal bleeding, unspecified: Secondary | ICD-10-CM | POA: Insufficient documentation

## 2015-11-20 DIAGNOSIS — M1990 Unspecified osteoarthritis, unspecified site: Secondary | ICD-10-CM | POA: Insufficient documentation

## 2015-11-20 DIAGNOSIS — Z7901 Long term (current) use of anticoagulants: Secondary | ICD-10-CM | POA: Insufficient documentation

## 2015-11-20 DIAGNOSIS — I2699 Other pulmonary embolism without acute cor pulmonale: Secondary | ICD-10-CM

## 2015-11-20 DIAGNOSIS — F329 Major depressive disorder, single episode, unspecified: Secondary | ICD-10-CM | POA: Insufficient documentation

## 2015-11-20 DIAGNOSIS — Z86711 Personal history of pulmonary embolism: Secondary | ICD-10-CM | POA: Insufficient documentation

## 2015-11-20 DIAGNOSIS — Z79899 Other long term (current) drug therapy: Secondary | ICD-10-CM | POA: Insufficient documentation

## 2015-11-20 DIAGNOSIS — I10 Essential (primary) hypertension: Secondary | ICD-10-CM | POA: Insufficient documentation

## 2015-11-20 DIAGNOSIS — R42 Dizziness and giddiness: Secondary | ICD-10-CM | POA: Insufficient documentation

## 2015-11-20 LAB — BASIC METABOLIC PANEL
BUN: 23 mg/dL (ref 7–25)
CO2: 23 mmol/L (ref 20–31)
Calcium: 10.1 mg/dL (ref 8.6–10.4)
Chloride: 105 mmol/L (ref 98–110)
Creat: 1.11 mg/dL — ABNORMAL HIGH (ref 0.50–1.05)
Glucose, Bld: 86 mg/dL (ref 65–99)
Potassium: 4.8 mmol/L (ref 3.5–5.3)
Sodium: 141 mmol/L (ref 135–146)

## 2015-11-20 LAB — CBC WITH DIFFERENTIAL/PLATELET
Basophils Absolute: 0 10*3/uL (ref 0.0–0.1)
Basophils Relative: 0 % (ref 0–1)
Eosinophils Absolute: 0.1 10*3/uL (ref 0.0–0.7)
Eosinophils Relative: 1 % (ref 0–5)
HCT: 41.4 % (ref 36.0–46.0)
Hemoglobin: 13.5 g/dL (ref 12.0–15.0)
Lymphocytes Relative: 36 % (ref 12–46)
Lymphs Abs: 3.4 10*3/uL (ref 0.7–4.0)
MCH: 22.8 pg — ABNORMAL LOW (ref 26.0–34.0)
MCHC: 32.6 g/dL (ref 30.0–36.0)
MCV: 69.8 fL — ABNORMAL LOW (ref 78.0–100.0)
MPV: 9.6 fL (ref 8.6–12.4)
Monocytes Absolute: 0.8 10*3/uL (ref 0.1–1.0)
Monocytes Relative: 8 % (ref 3–12)
Neutro Abs: 5.2 10*3/uL (ref 1.7–7.7)
Neutrophils Relative %: 55 % (ref 43–77)
Platelets: 379 10*3/uL (ref 150–400)
RBC: 5.93 MIL/uL — ABNORMAL HIGH (ref 3.87–5.11)
RDW: 18.5 % — ABNORMAL HIGH (ref 11.5–15.5)
WBC: 9.5 10*3/uL (ref 4.0–10.5)

## 2015-11-20 MED ORDER — NYSTATIN 100000 UNIT/GM EX POWD: 5000.0000 g | Freq: Three times a day (TID) | CUTANEOUS | Status: DC

## 2015-11-20 MED ORDER — FLUCONAZOLE 150 MG PO TABS: 150.0000 mg | ORAL_TABLET | Freq: Once | ORAL | Status: DC

## 2015-11-20 NOTE — Progress Notes (Signed)
[name] [name], is a 55 y.o. female  [id number]  [id number]  DOB - [birth year]  CC:  Chief Complaint  Patient presents with  . Vaginal Bleeding  . Hospitalization Follow-up       HPI: [name] [name] is a 55 y.o. female here today to establish medical care.  She states that since she was here last week, she has started having vaginal bleeding that started Friday, worse Sat/Sunday, requiring 2-3 pantiliners.  Of note, also describes stools have been dark in color recently, "tarry" consistency.  +Sob/doe about same since Bilat PE dx, her dizziness she says is slightly better w/ meclizine prescribed last week.   Also having some intermmittant pinkish plepgm production as well.  Has some pain in left lq/abd region as well, hx of diveriticultis/colitis in past per pt.    Currently taking her Xarelto 20mg  tabs.    Patient has No headache, No chest pain, No Nausea, No new weakness tingling or numbness, No Cough  No Known Allergies Past Medical History  Diagnosis Date  . Pulmonary embolism ([place])   . Hypertension   . Obesity   . Arthritis   . Pneumonia   . Diverticulitis   morbid obesity.  Per pt, colonoscopy/egd 2015 Current Outpatient Prescriptions on File Prior to Visit  Medication Sig Dispense Refill  . albuterol (PROVENTIL HFA;VENTOLIN HFA) 108 (90 Base) MCG/ACT inhaler Inhale 1-2 puffs into the lungs every 6 (six) hours as needed for wheezing or shortness of breath.    . ALPRAZolam (XANAX) 1 MG tablet Take 1 mg by mouth 2 (two) times daily as needed for anxiety or sleep.    Marland Kitchen atenolol (TENORMIN) 100 MG tablet Take 100 mg by mouth daily.    . budesonide-formoterol (SYMBICORT) 160-4.5 MCG/ACT inhaler Inhale 2 puffs into the lungs 2 (two) times daily.    Marland Kitchen dicyclomine (BENTYL) 20 MG tablet Take 20 mg by mouth 2 (two) times daily.    Marland Kitchen docusate sodium (COLACE) 100 MG capsule Take 100 mg by mouth 2 (two) times daily.    Marland Kitchen guaiFENesin-codeine (ROBITUSSIN AC) 100-10 MG/5ML syrup  Take 5 mLs by mouth 3 (three) times daily as needed for cough or congestion.    . meclizine (ANTIVERT) 25 MG tablet Take 1 tablet (25 mg total) by mouth 3 (three) times daily as needed for dizziness. 30 tablet 0  . promethazine (PHENERGAN) 25 MG tablet Take 25 mg by mouth every 6 (six) hours as needed for nausea or vomiting.    . rivaroxaban (XARELTO) 20 MG TABS tablet Take 1 tablet (20 mg total) by mouth daily with supper. 30 tablet 3  . triamterene-hydrochlorothiazide (MAXZIDE-25) 37.5-25 MG tablet Take 1 tablet by mouth daily.    Marland Kitchen zolpidem (AMBIEN) 10 MG tablet Take 10 mg by mouth at bedtime.     No current facility-administered medications on file prior to visit.   No family history on file. Social History   Social History  . Marital Status: Single    Spouse Name: N/A  . Number of Children: N/A  . Years of Education: N/A   Occupational History  . Not on file.   Social History Main Topics  . Smoking status: Never Smoker   . Smokeless tobacco: Not on file  . Alcohol Use: No  . Drug Use: No  . Sexual Activity: Not on file   Other Topics Concern  . Not on file   Social History Narrative    Review of Systems: Constitutional: Negative for fever, chills, diaphoresis,  activity change, appetite change and fatigue. HENT: Negative for ear pain, nosebleeds, congestion, facial swelling, rhinorrhea, neck pain, neck stiffness and ear discharge.  Eyes: Negative for pain, discharge, redness, itching and visual disturbance. Respiratory: Negative for cough, choking, chest tightness,  wheezing and stridor.  +DOE about same Cardiovascular: Negative for chest pain, palpitations and leg swelling. Gastrointestinal:  +llq abd pain, on off for days/months, difficult to quantify; stools dark/tar consistency since xarelto Genitourinary: Negative for dysuria, urgency, frequency, hematuria, flank pain, decreased urine volume, difficulty urinating and dyspareunia.   + increasing vaginal bleeding since  Friday. Musculoskeletal: Negative for back pain, joint swelling, arthralgia and gait problem. Neurological: Negative for tremors, seizures, syncope, facial asymmetry, speech difficulty, weakness, light-headedness, numbness and headaches.   +dizzness, but better w/ meclizine Hematological: Negative for adenopathy. Does not bruise/bleed easily. Psychiatric/Behavioral: Negative for hallucinations, behavioral problems, confusion, dysphoric mood, decreased concentration and agitation.   Depressed/anxious about medical status, tearful at times, denies si/hi/ah/vh.   Objective:   Filed Vitals:   11/20/15 1608  BP: 136/75  Pulse: 83  Temp: 98 F (36.7 C)  Resp: 14   Ambulatory o2 sats with ambulation around our ward 2x, sats remained >94%.   Physical Exam:   RN /Lou was present during exam/ob exam. Constitutional: Patient appears well-developed and well-nourished. No distress.  Morbidly obese, doe with minimal movement, dyspneic, but able to speak in full sentences.   HENT: Normocephalic, atraumatic, External right and left ear normal. Bilat TM clear, Oropharynx is clear and moist, no exudate noted..   Thick neck.  Eyes: Conjunctivae and EOM are normal. PERRL, no scleral icterus. Neck: Normal ROM. Neck supple. No JVD. No tracheal deviation. No thyromegaly. CVS: RRR, S1/S2 +, no murmurs, no gallops, no carotid bruit.  Pulmonary: Effort and breath sounds normal, no stridor, rhonchi, wheezes, rales.    Abdominal: Soft. BS +, obese,  ttp llq, but no g/r.  Difficult exam due to body habitus. Musculoskeletal: Normal range of motion. No edema and no tenderness.  Lymphadenopathy: No lymphadenopathy noted, cervical, inguinal or axillary Neuro: Alert. Normal reflexes, muscle tone coordination. No cranial nerve deficit. Skin: Skin is warm and dry. No rash noted. Not diaphoretic. No erythema. No pallor. Psychiatric: tearful at times. Pleasant.  aaox3   Pelvic Exam:external genitalia normal, no adnexal  masses or tenderness,pt is post hysterectomy,  no cervical motion tenderness, some white thick discharge, no obvious signs of bleeding/blood noted on exam.  guiac checked during exam.  Lab Results  Component Value Date   WBC 9.2 11/12/2015   HGB 12.0 11/12/2015   HCT 36.8 11/12/2015   MCV 67.8* 11/12/2015   PLT 350 11/12/2015   Lab Results  Component Value Date   CREATININE 1.11* 11/12/2015   BUN 18 11/12/2015   NA 143 11/12/2015   K 4.1 11/12/2015   CL 106 11/12/2015   CO2 27 11/12/2015    No results found for: HGBA1C Lipid Panel  No results found for: CHOL, TRIG, HDL, CHOLHDL, VLDL, LDLCALC   fobt Neg     Assessment and plan:   1. Pulmonary embolism, other ([place]), bilateral, symptomatic w/ DOE/bleeding - currently on xarelto 20. Trying to get records from [place], [place] prior to more diagnostic work.  2. Vaginal bleeding - no obvious bleeding on exam, checking - CBC with Differential - Basic Metabolic Panel  3. Hematochezia per pt, - fobt negative, fu w/ cbc  4. Morbid obesity  5. Vaginal candiasis - trial diflucan 150mg  x3, and nystatin powder.  6. Depression, suspect due to adjustments (recent move here), overall decline of health and lack of insurance - reassurance given -fiancial services - fu behavior health as well  Return in about 1 week (around 11/27/2015).  The patient was given clear instructions to go to ER or return to medical center if symptoms don't improve, worsen or new problems develop. The patient verbalized understanding. The patient was told to call to get lab results if they haven't heard anything in the next week.      Maren Reamer, MD, [place], [place]   11/20/2015, 5:27 PM

## 2015-11-20 NOTE — Progress Notes (Signed)
Patient c/o vaginal bleeding. Patient c/o SOB, and dizziness, that got worse x2 days ago.  Patient had hysterectomy and concern about vaginal bleeding.  Phlegm pink in color.  Patient has no further concerns.

## 2015-11-20 NOTE — Patient Instructions (Signed)
-   labs today - pick up meds - your stool study was negative for occult blood.  We will follow up with the blood work to check your anemia.  We will call you back in next few days once results in.   It was a pleasure meeting you.

## 2015-11-21 ENCOUNTER — Telehealth: Payer: Self-pay

## 2015-11-21 NOTE — Telephone Encounter (Signed)
-----   Message from Maren Reamer, MD sent at 11/21/2015  8:46 AM EDT ----- Please call pt and tell her her blood count /labs were normal.  No signs of anemia. Will watch.  Please tell her to come back and see [place] next week so we can f/u. Thank you.

## 2015-11-21 NOTE — Telephone Encounter (Signed)
CMA called patient, patient verified name and DOB. Patient was given results, verbalized understanding, with no further questions. Patient was transferred to the front reschedule appt for next week that was cancelled by front desk staff yesterday.

## 2015-11-29 ENCOUNTER — Ambulatory Visit: Payer: Self-pay | Admitting: Internal Medicine

## 2015-12-06 MED FILL — FLUCONAZOLE 150 MG TABLET: 150 | 1 days supply | Qty: 1 | Fill #0

## 2015-12-06 MED FILL — NYSTOP 100,000 UNITS/GM PWD: 100000 | 14 days supply | Qty: 60 | Fill #0

## 2015-12-12 ENCOUNTER — Ambulatory Visit (HOSPITAL_BASED_OUTPATIENT_CLINIC_OR_DEPARTMENT_OTHER): Payer: Self-pay | Admitting: Clinical

## 2015-12-12 ENCOUNTER — Ambulatory Visit: Payer: Self-pay | Attending: Internal Medicine | Admitting: Internal Medicine

## 2015-12-12 ENCOUNTER — Encounter: Payer: Self-pay | Admitting: Internal Medicine

## 2015-12-12 VITALS — BP 150/88 | HR 72 | Temp 98.0°F | Resp 16 | Ht 65.0 in | Wt 318.0 lb

## 2015-12-12 DIAGNOSIS — I10 Essential (primary) hypertension: Secondary | ICD-10-CM

## 2015-12-12 DIAGNOSIS — E661 Drug-induced obesity: Secondary | ICD-10-CM

## 2015-12-12 DIAGNOSIS — F411 Generalized anxiety disorder: Secondary | ICD-10-CM

## 2015-12-12 DIAGNOSIS — G4733 Obstructive sleep apnea (adult) (pediatric): Secondary | ICD-10-CM

## 2015-12-12 DIAGNOSIS — G4700 Insomnia, unspecified: Secondary | ICD-10-CM

## 2015-12-12 DIAGNOSIS — I2692 Saddle embolus of pulmonary artery without acute cor pulmonale: Secondary | ICD-10-CM

## 2015-12-12 DIAGNOSIS — Z9989 Dependence on other enabling machines and devices: Secondary | ICD-10-CM

## 2015-12-12 DIAGNOSIS — R42 Dizziness and giddiness: Secondary | ICD-10-CM

## 2015-12-12 MED ORDER — TEMAZEPAM 7.5 MG PO CAPS: 7.5000 mg | ORAL_CAPSULE | Freq: Every evening | ORAL | Status: DC | PRN

## 2015-12-12 MED ORDER — CITALOPRAM HYDROBROMIDE 20 MG PO TABS: 20.0000 mg | ORAL_TABLET | Freq: Every day | ORAL | Status: DC

## 2015-12-12 MED ORDER — AMLODIPINE BESYLATE 5 MG PO TABS: 5.0000 mg | ORAL_TABLET | Freq: Every day | ORAL | Status: DC

## 2015-12-12 MED FILL — ?CITALOPRAM HBR 20 MG TABLE: 20 | 30 days supply | Qty: 30 | Fill #0

## 2015-12-12 MED FILL — ?AMLODIPINE BESYLATE 5 MG T: 5 | 30 days supply | Qty: 30 | Fill #0

## 2015-12-12 MED FILL — XARELTO 20 MG TABLET: 20 | 30 days supply | Qty: 30 | Fill #1

## 2015-12-12 NOTE — Progress Notes (Signed)
[name] [name], is a 55 y.o. female  [id number]  [id number]  DOB - Feb 27, 1961  Chief Complaint  Patient presents with  . Follow-up        Subjective:   [name] [name] is a 55 y.o. female here today for a follow up visit.  Seen in clinic 3/13 for bilateral PE. Feels about same, still w/ DOE/intermittant pleuritic pain, and very anxious/tearful about her health.  Still gets "dizzy" spells, with room spinning sometimes, but meclezine does not help.  Crying in room, just saw York Cerise our SW counselor due to anxiety prior to my arrival as well.  Uses her cpap of osa, but still unable to go to sleep at night.  Just lays in bed staring.  Ambien 10mg  does not wkr for her.   She understands a big part of it is her weight.   Patient has No headache,  No abdominal pain - No Nausea, No new weakness tingling or numbness, No Cough . Denies si/hi/ah/vh.    No problems updated.  ALLERGIES: No Known Allergies  PAST MEDICAL HISTORY: Past Medical History  Diagnosis Date  . Pulmonary embolism ([place])   . Hypertension   . Obesity   . Arthritis   . Pneumonia   . Diverticulitis     MEDICATIONS AT HOME: Prior to Admission medications   Medication Sig Start Date End Date Taking? Authorizing Provider  albuterol (PROVENTIL HFA;VENTOLIN HFA) 108 (90 Base) MCG/ACT inhaler Inhale 1-2 puffs into the lungs every 6 (six) hours as needed for wheezing or shortness of breath.    Historical Provider, MD  ALPRAZolam Duanne Moron) 1 MG tablet Take 1 mg by mouth 2 (two) times daily as needed for anxiety or sleep.    Historical Provider, MD  amLODipine (NORVASC) 5 MG tablet Take 1 tablet (5 mg total) by mouth daily. 12/12/15   Maren Reamer, MD  atenolol (TENORMIN) 100 MG tablet Take 100 mg by mouth daily.    Historical Provider, MD  budesonide-formoterol (SYMBICORT) 160-4.5 MCG/ACT inhaler Inhale 2 puffs into the lungs 2 (two) times daily.    Historical Provider, MD  citalopram (CELEXA) 20 MG tablet Take 1  tablet (20 mg total) by mouth daily. 12/12/15   Maren Reamer, MD  dicyclomine (BENTYL) 20 MG tablet Take 20 mg by mouth 2 (two) times daily.    Historical Provider, MD  docusate sodium (COLACE) 100 MG capsule Take 100 mg by mouth 2 (two) times daily.    Historical Provider, MD  fluconazole (DIFLUCAN) 150 MG tablet Take 1 tablet (150 mg total) by mouth once. 11/20/15   Maren Reamer, MD  guaiFENesin-codeine (ROBITUSSIN AC) 100-10 MG/5ML syrup Take 5 mLs by mouth 3 (three) times daily as needed for cough or congestion.    Historical Provider, MD  meclizine (ANTIVERT) 25 MG tablet Take 1 tablet (25 mg total) by mouth 3 (three) times daily as needed for dizziness. 11/15/15   Willeen Niece, MD  nystatin (MYCOSTATIN/NYSTOP) 100000 UNIT/GM POWD Apply 5,000 g topically 3 (three) times daily. Vaginal area 11/20/15   Maren Reamer, MD  promethazine (PHENERGAN) 25 MG tablet Take 25 mg by mouth every 6 (six) hours as needed for nausea or vomiting.    Historical Provider, MD  rivaroxaban (XARELTO) 20 MG TABS tablet Take 1 tablet (20 mg total) by mouth daily with supper. 11/15/15   Willeen Niece, MD  temazepam (RESTORIL) 7.5 MG capsule Take 1 capsule (7.5 mg total) by mouth at bedtime as needed for  sleep. 12/12/15   Maren Reamer, MD  triamterene-hydrochlorothiazide (MAXZIDE-25) 37.5-25 MG tablet Take 1 tablet by mouth daily.    Historical Provider, MD     Objective:   Filed Vitals:   12/12/15 1637  BP: 150/88  Pulse: 72  Temp: 98 F (36.7 C)  Resp: 16  Height: 5\' 5"  (1.651 m)  Weight: 318 lb (144.244 kg)  SpO2: 100%    Exam General appearance : Awake, alert, anxious/tearful. Speech Clear. Not toxic looking , morbid obese HEENT: Atraumatic and Normocephalic, pupils equally reactive to light. Neck: supple, no JVD. No cervical lymphadenopathy.  Chest:Good air entry bilaterally, no added sounds. CVS: S1 S2 regular, no murmurs/gallups or rubs. Abdomen: Bowel sounds active, obese, Non tender and  not distended with no gaurding, rigidity or rebound. Extremities: B/L Lower Ext both legs are warm to touch, trace edema/appears 2nd to gravity bilat. Neurology: Awake alert, and oriented X 3, CN II-XII grossly intact, Non focal Skin:No Rash Psyche: tearful about her overall health.    Data Review No results found for: HGBA1C   Assessment & Plan   1. 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 From [facility], [place], [place] - I do not see it in system, will asked to resend, I attempted to call them myself [phone] - all closed - vs /recent labs unremarkable. - will chk ECHO to r/o ra strain/cor pulmonale./ef  2. Essential hypertension - remains elavated, she is already on atenolol 100qd, and maxide , will add norvasc 5 for now  3. OSA on CPAP - encouraged use, she is out of supplies, Roselyn Reef sw was able to give her some cpap supplies  4. Vertigo ? May be due to all of above instead  5. 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 Dash diet  6. Anxiety state, w/ likely anxiety/depression NOS - appreciate sw [facility] Reef assitance - she is currently on ativan prn, recd decreasing if at all possible - amendible to trying celexa 20 qday, started  7. Insomnia - may be multifactorial, sleep hygiene tips given for insomnia - dc ambien - trial short course of restoril for now.   Patient have been counseled extensively about nutrition and exercise  Return in about 4 weeks (around 01/09/2016).  The patient was given clear instructions to go to ER or return to medical center if symptoms don't improve, worsen or new problems develop. The patient verbalized understanding. The patient was told to call to get lab results if they haven't heard  anything in the next week.    Maren Reamer, MD, [facility] [place], [place]   12/12/2015, 4:58 PM

## 2015-12-12 NOTE — Patient Instructions (Addendum)
Please get her to sign release of information card again.  I hope you start feeling better soon.  Insomnia Insomnia is a sleep disorder that makes it difficult to fall asleep or to stay asleep. Insomnia can cause tiredness (fatigue), low energy, difficulty concentrating, mood swings, and poor performance at work or school.  There are three different ways to classify insomnia:  Difficulty falling asleep.  Difficulty staying asleep.  Waking up too early in the morning. Any type of insomnia can be long-term (chronic) or short-term (acute). Both are common. Short-term insomnia usually lasts for three months or less. Chronic insomnia occurs at least three times a week for longer than three months. CAUSES  Insomnia may be caused by another condition, situation, or substance, such as:  Anxiety.  Certain medicines.  Gastroesophageal reflux disease (GERD) or other gastrointestinal conditions.  Asthma or other breathing conditions.  Restless legs syndrome, sleep apnea, or other sleep disorders.  Chronic pain.  Menopause. This may include hot flashes.  Stroke.  Abuse of alcohol, tobacco, or illegal drugs.  Depression.  Caffeine.   Neurological disorders, such as Alzheimer disease.  An overactive thyroid (hyperthyroidism). The cause of insomnia may not be known. RISK FACTORS Risk factors for insomnia include:  Gender. Women are more commonly affected than men.  Age. Insomnia is more common as you get older.  Stress. This may involve your professional or personal life.  Income. Insomnia is more common in people with lower income.  Lack of exercise.   Irregular work schedule or night shifts.  Traveling between different time zones. SIGNS AND SYMPTOMS If you have insomnia, trouble falling asleep or trouble staying asleep is the main symptom. This may lead to other symptoms, such as:  Feeling fatigued.  Feeling nervous about going to sleep.  Not feeling rested in  the morning.  Having trouble concentrating.  Feeling irritable, anxious, or depressed. TREATMENT  Treatment for insomnia depends on the cause. If your insomnia is caused by an underlying condition, treatment will focus on addressing the condition. Treatment may also include:   Medicines to help you sleep.  Counseling or therapy.  Lifestyle adjustments. HOME CARE INSTRUCTIONS   Take medicines only as directed by your health care provider.  Keep regular sleeping and waking hours. Avoid naps.  Keep a sleep diary to help you and your health care provider figure out what could be causing your insomnia. Include:   When you sleep.  When you wake up during the night.  How well you sleep.   How rested you feel the next day.  Any side effects of medicines you are taking.  What you eat and drink.   Make your bedroom a comfortable place where it is easy to fall asleep:  Put up shades or special blackout curtains to block light from outside.  Use a white noise machine to block noise.  Keep the temperature cool.   Exercise regularly as directed by your health care provider. Avoid exercising right before bedtime.  Use relaxation techniques to manage stress. Ask your health care provider to suggest some techniques that may work well for you. These may include:  Breathing exercises.  Routines to release muscle tension.  Visualizing peaceful scenes.  Cut back on alcohol, caffeinated beverages, and cigarettes, especially close to bedtime. These can disrupt your sleep.  Do not overeat or eat spicy foods right before bedtime. This can lead to digestive discomfort that can make it hard for you to sleep.  Limit screen use before  bedtime. This includes:  Watching TV.  Using your smartphone, tablet, and computer.  Stick to a routine. This can help you fall asleep faster. Try to do a quiet activity, brush your teeth, and go to bed at the same time each night.  Get out of bed  if you are still awake after 15 minutes of trying to sleep. Keep the lights down, but try reading or doing a quiet activity. When you feel sleepy, go back to bed.  Make sure that you drive carefully. Avoid driving if you feel very sleepy.  Keep all follow-up appointments as directed by your health care provider. This is important. SEEK MEDICAL CARE IF:   You are tired throughout the day or have trouble in your daily routine due to sleepiness.  You continue to have sleep problems or your sleep problems get worse. SEEK IMMEDIATE MEDICAL CARE IF:   You have serious thoughts about hurting yourself or someone else.   This information is not intended to replace advice given to you by your health care provider. Make sure you discuss any questions you have with your health care provider.   Document Released: 08/23/2000 Document Revised: 05/17/2015 Document Reviewed: 05/27/2014 Elsevier Interactive Patient Education 2016 [place] DASH stands for "Dietary Approaches to Stop Hypertension." The DASH eating plan is a healthy eating plan that has been shown to reduce high blood pressure (hypertension). Additional health benefits may include reducing the risk of type 2 diabetes mellitus, heart disease, and stroke. The DASH eating plan may also help with weight loss. WHAT DO I NEED TO KNOW ABOUT THE DASH EATING PLAN? For the DASH eating plan, you will follow these general guidelines:  Choose foods with a percent daily value for sodium of less than 5% (as listed on the food label).  Use salt-free seasonings or herbs instead of table salt or sea salt.  Check with your health care provider or pharmacist before using salt substitutes.  Eat lower-sodium products, often labeled as "lower sodium" or "no salt added."  Eat fresh foods.  Eat more vegetables, fruits, and low-fat dairy products.  Choose whole grains. Look for the word "whole" as the first word in the ingredient  list.  Choose fish and skinless chicken or [place] more often than red meat. Limit fish, poultry, and meat to 6 oz (170 g) each day.  Limit sweets, desserts, sugars, and sugary drinks.  Choose heart-healthy fats.  Limit cheese to 1 oz (28 g) per day.  Eat more home-cooked food and less restaurant, buffet, and fast food.  Limit fried foods.  Cook foods using methods other than frying.  Limit canned vegetables. If you do use them, rinse them well to decrease the sodium.  When eating at a restaurant, ask that your food be prepared with less salt, or no salt if possible. WHAT FOODS CAN I EAT? Seek help from a dietitian for individual calorie needs. Grains Whole grain or whole wheat bread. Brown rice. Whole grain or whole wheat pasta. Quinoa, bulgur, and whole grain cereals. Low-sodium cereals. Corn or whole wheat flour tortillas. Whole grain cornbread. Whole grain crackers. Low-sodium crackers. Vegetables Fresh or frozen vegetables (raw, steamed, roasted, or grilled). Low-sodium or reduced-sodium tomato and vegetable juices. Low-sodium or reduced-sodium tomato sauce and paste. Low-sodium or reduced-sodium canned vegetables.  Fruits All fresh, canned (in natural juice), or frozen fruits. Meat and Other Protein Products Ground beef (85% or leaner), grass-fed beef, or beef trimmed of fat. Skinless  chicken or [place]. Ground chicken or [place]. Pork trimmed of fat. All fish and seafood. Eggs. Dried beans, peas, or lentils. Unsalted nuts and seeds. Unsalted canned beans. Dairy Low-fat dairy products, such as skim or 1% milk, 2% or reduced-fat cheeses, low-fat ricotta or cottage cheese, or plain low-fat yogurt. Low-sodium or reduced-sodium cheeses. Fats and Oils Tub margarines without trans fats. Light or reduced-fat mayonnaise and salad dressings (reduced sodium). Avocado. Safflower, olive, or canola oils. Natural peanut or almond butter. Other Unsalted popcorn and pretzels. The items listed  above may not be a complete list of recommended foods or beverages. Contact your dietitian for more options. WHAT FOODS ARE NOT RECOMMENDED? Grains White bread. White pasta. White rice. Refined cornbread. Bagels and croissants. Crackers that contain trans fat. Vegetables Creamed or fried vegetables. Vegetables in a cheese sauce. Regular canned vegetables. Regular canned tomato sauce and paste. Regular tomato and vegetable juices. Fruits Dried fruits. Canned fruit in light or heavy syrup. Fruit juice. Meat and Other Protein Products Fatty cuts of meat. Ribs, chicken wings, bacon, sausage, bologna, salami, chitterlings, fatback, hot dogs, bratwurst, and packaged luncheon meats. Salted nuts and seeds. Canned beans with salt. Dairy Whole or 2% milk, cream, half-and-half, and cream cheese. Whole-fat or sweetened yogurt. Full-fat cheeses or blue cheese. Nondairy creamers and whipped toppings. Processed cheese, cheese spreads, or cheese curds. Condiments Onion and garlic salt, seasoned salt, table salt, and sea salt. Canned and packaged gravies. Worcestershire sauce. Tartar sauce. Barbecue sauce. Teriyaki sauce. Soy sauce, including reduced sodium. Steak sauce. Fish sauce. Oyster sauce. Cocktail sauce. Horseradish. Ketchup and mustard. Meat flavorings and tenderizers. Bouillon cubes. Hot sauce. Tabasco sauce. Marinades. Taco seasonings. Relishes. Fats and Oils Butter, stick margarine, lard, shortening, ghee, and bacon fat. Coconut, palm kernel, or palm oils. Regular salad dressings. Other Pickles and olives. Salted popcorn and pretzels. The items listed above may not be a complete list of foods and beverages to avoid. Contact your dietitian for more information. WHERE CAN I FIND MORE INFORMATION? National Heart, Lung, and Blood Institute: travelstabloid.com   This information is not intended to replace advice given to you by your health care provider. Make sure you  discuss any questions you have with your health care provider.   Document Released: 08/15/2011 Document Revised: 09/16/2014 Document Reviewed: 06/30/2013 Elsevier Interactive Patient Education [employer].

## 2015-12-12 NOTE — Progress Notes (Signed)
Patient here for follow up on her unspecified chest pain  Depression and anxiety

## 2015-12-12 NOTE — Progress Notes (Signed)
ASSESSMENT: Pt currently experiencing Generalized anxiety disorder. Pt needs to f/u with PCP and [facility]; would benefit from psychoeducation and brief therapeutic interventions regarding coping with symptoms of anxiety, including sleep issues. Stage of Change: contemplative  PLAN: 1. F/U with behavioral health consultant in as needed 2. Psychiatric Medications: Ambien 3. Behavioral recommendation(s):   -Consider reading educational materials regarding coping with sleep issues -Consider discussing feelings with husband and family -Consider asking PCP about referral to sleep clinic SUBJECTIVE: Pt. referred by Dr Janne Napoleon for symptoms of anxiety and depression:  Pt. reports the following symptoms/concerns: Pt states that she is not sleeping well, that she often does not fall asleep until the morning, then wakes up at noon feeling "lazy, like I should wake up because it's so late", continues to have feelings of dread, as if she is going to die because of her health, but does not want to burden her husband and family with her feelings; also feels she cannot "get her breath", increased when she feels stress.  Duration of problem: Over one month Severity: severe  OBJECTIVE: Orientation & Cognition: Oriented x3. Thought processes normal and appropriate to situation. Mood: teary. Affect: appropriate Appearance: appropriate Risk of harm to self or others: no known risk of harm to self or others, no SI, no HI Substance use: none Assessments administered: none today, "I can't breathe"  Diagnosis: Generalized anxiety disorder CPT Code: F32.1 -------------------------------------------- Other(s) present in the room: none  Time spent with patient in exam room: 20 minutes, 4:10-4:30pm   Depression screen [facility] 2/9 11/20/2015 11/15/2015 11/15/2015  Decreased Interest 0 0 0  Down, Depressed, Hopeless 0 2 0  PHQ - 2 Score 0 2 0  Altered sleeping - 3 -  Tired, decreased energy - 3 -  Change in appetite - 2 -   Feeling bad or failure about yourself  - 3 -  Trouble concentrating - 3 -  Moving slowly or fidgety/restless - 1 -  Suicidal thoughts - 0 -  PHQ-9 Score - 17 -

## 2015-12-13 ENCOUNTER — Telehealth: Payer: Self-pay

## 2015-12-13 NOTE — Telephone Encounter (Signed)
Called patient this am and she is aware Her echo is scheduled for this Friday April 7th at [facility] Patient is to arrive at 8:45

## 2015-12-15 ENCOUNTER — Ambulatory Visit (HOSPITAL_COMMUNITY)
Admission: RE | Admit: 2015-12-15 | Discharge: 2015-12-15 | Disposition: A | Discharge status: Home / Self Care | Payer: Self-pay | Source: Ambulatory Visit | Attending: Internal Medicine | Admitting: Internal Medicine

## 2015-12-15 DIAGNOSIS — I2699 Other pulmonary embolism without acute cor pulmonale: Secondary | ICD-10-CM

## 2015-12-15 DIAGNOSIS — I2692 Saddle embolus of pulmonary artery without acute cor pulmonale: Secondary | ICD-10-CM | POA: Insufficient documentation

## 2015-12-15 DIAGNOSIS — I313 Pericardial effusion (noninflammatory): Secondary | ICD-10-CM | POA: Insufficient documentation

## 2015-12-15 NOTE — Progress Notes (Signed)
  Echocardiogram 2D Echocardiogram has been performed.  [name] [name] 12/15/2015, 9:57 AM

## 2015-12-18 ENCOUNTER — Other Ambulatory Visit: Payer: Self-pay | Admitting: Internal Medicine

## 2015-12-18 DIAGNOSIS — I2699 Other pulmonary embolism without acute cor pulmonale: Secondary | ICD-10-CM

## 2015-12-19 MED ORDER — ONDANSETRON HCL 4 MG PO TABS: 4.0000 mg | ORAL_TABLET | Freq: Three times a day (TID) | ORAL | Status: DC | PRN

## 2015-12-19 NOTE — Telephone Encounter (Signed)
Patient verified DOB Patient is aware of heart function being normal. Patient is aware of bilateral PE's causing a strain on her heart. Patient advised to continue taking blood thinners and advised to meet with Dr. Joya Gaskins for pulmonology clinic. Patient is also aware of CT Angio of chest being ordered on Monday 12/25/15 at 1:00pm. Patient advised to arrive at 12:45pm at the [facility] entrance. Patient is to only have liquids 4 hours prior to procedure. Patient states she is nauseous and can barely keep anything down.

## 2015-12-19 NOTE — Telephone Encounter (Signed)
Please call her and tell her to make appt to come in. Sounds like a stomach bug/viral. Drink plenty of liquids until then, I will put in prescription for zofran as well.  If cant keep anything down, popsicles help.

## 2015-12-25 ENCOUNTER — Ambulatory Visit (HOSPITAL_COMMUNITY)
Admission: RE | Admit: 2015-12-25 | Discharge: 2015-12-25 | Disposition: A | Discharge status: Home / Self Care | Payer: Self-pay | Source: Ambulatory Visit | Attending: Internal Medicine | Admitting: Internal Medicine

## 2015-12-25 ENCOUNTER — Other Ambulatory Visit: Payer: Self-pay | Admitting: Internal Medicine

## 2015-12-25 ENCOUNTER — Telehealth: Payer: Self-pay | Admitting: Internal Medicine

## 2015-12-25 ENCOUNTER — Encounter (HOSPITAL_COMMUNITY): Payer: Self-pay

## 2015-12-25 DIAGNOSIS — I2699 Other pulmonary embolism without acute cor pulmonale: Secondary | ICD-10-CM | POA: Insufficient documentation

## 2015-12-25 MED ORDER — HEPARIN SOD (PORK) LOCK FLUSH 100 UNIT/ML IV SOLN
INTRAVENOUS | Status: AC
  Filled 2015-12-25: qty 5

## 2015-12-25 MED ORDER — IOPAMIDOL (ISOVUE-370) INJECTION 76%
INTRAVENOUS | Status: AC
Start: 2015-12-25 — End: 2015-12-25
  Administered 2015-12-25: 80 mL
  Filled 2015-12-25: qty 100

## 2015-12-25 MED ORDER — RIVAROXABAN 20 MG PO TABS: 20.0000 mg | ORAL_TABLET | Freq: Every day | ORAL | Status: DC

## 2015-12-25 NOTE — Telephone Encounter (Signed)
Medical Assistant left message on patient's home and cell voicemail. Voicemail states to give a call back to [place] with [facility] at [phone]. Voice message advises patient per PCP to head to the ER if she is experiencing SOB.

## 2015-12-25 NOTE — Telephone Encounter (Signed)
Patient called stating that she was sick over the weekend and has been experiencing shortness of breath, weakness.  Please follow up.

## 2015-12-25 NOTE — Telephone Encounter (Signed)
I attempted to call [name] [name] back.  No answer, left vm.  Urged her to go to ER if still significant SOB/DOE or any other medical concerns.

## 2015-12-25 NOTE — Progress Notes (Signed)
Outside Chart review.   Pt's records came in from [facility].    Pt was admitted to [facility] 10/05/15 , dc 10/09/15. Presented w/ SOB. 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  Right Upper extremity and Bilateral LE venous dopplers showed no evidence of VTE.  Pt started on Xarelto for 72months at that time.  Noted Right Knee pain during hospitalization, right knee xray showed OA and loose body.  DR. Arnette Norris ortho evaluated her inpt and recd to f/u w/ Dr Arnette Norris as outpt.  Admitting dx 1. Acute pe 2. Osa, on cpap 3. morbid obesity, bmi 54.5 4. Essential htn 5. Anxiety do 6. Osteoarthritis  xrays reports: summaries  10/07/15 Right shoulder xrays: unremarkable.  10/07/15 right knee xray: oa, possible intra-articular loose body.   10/06/15  bilat LE dopplers:  Neg for dvt of right and left leg form common fem through popliteal vein.  10/05/15 ct chest angio:  1. 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 Mildly enlarged main pulmonary artery, suggestive of Pulm art. Htn.   10/05/15 cxrn 2v - neg acute cardiopulm abnml  10/05/15 right upper extremity US: no VTE  =  Ct angio chest today/ 12/25/15 report - actually Looks slightly better than 10/05/15 given only notes PE in [facility].  IMPRESSION: Lack of opacification of the right lower lobe pulmonary artery consistent with embolic occlusion. I think it is indeterminate if this represents an acute embolus or chronic vascular scarring. Similar smaller finding in the left lower lobe. In the absence of a previous study, it may be necessary to treat these as acute embolic occlusions.   - my clinic has attempted to call patient several times today to chk on her status, we have only been able to leave VM. Urge ED visit if remains SOB/DOE/cp or any other medical concern.

## 2015-12-27 ENCOUNTER — Telehealth: Payer: Self-pay | Admitting: Clinical

## 2015-12-27 ENCOUNTER — Ambulatory Visit: Payer: Self-pay | Admitting: Clinical

## 2015-12-27 NOTE — Telephone Encounter (Signed)
Termination with pt; pt is aware that she may make appointment with [facility] prior to the end of April, as needed.   [name] [name] says she had a CT scan on Monday, is now on Medicare Part A, and that she still has a difficult time breathing; she agrees that she will go to the ED if necessary.

## 2016-01-10 ENCOUNTER — Other Ambulatory Visit (INDEPENDENT_AMBULATORY_CARE_PROVIDER_SITE_OTHER): Payer: 59

## 2016-01-10 ENCOUNTER — Ambulatory Visit (INDEPENDENT_AMBULATORY_CARE_PROVIDER_SITE_OTHER): Payer: Commercial Managed Care - HMO | Admitting: Pulmonary Disease

## 2016-01-10 ENCOUNTER — Encounter: Payer: Self-pay | Admitting: Pulmonary Disease

## 2016-01-10 VITALS — BP 148/84 | HR 72 | Ht 64.5 in | Wt 328.0 lb

## 2016-01-10 DIAGNOSIS — R0600 Dyspnea, unspecified: Secondary | ICD-10-CM

## 2016-01-10 LAB — HEPATIC FUNCTION PANEL
ALT: 8 U/L (ref 0–35)
AST: 9 U/L (ref 0–37)
Albumin: 3.9 g/dL (ref 3.5–5.2)
Alkaline Phosphatase: 76 U/L (ref 39–117)
Bilirubin, Direct: 0 mg/dL (ref 0.0–0.3)
Total Bilirubin: 0.3 mg/dL (ref 0.2–1.2)
Total Protein: 7.1 g/dL (ref 6.0–8.3)

## 2016-01-10 NOTE — Patient Instructions (Signed)
We will schedule her for some blood tests including HIV, LFTs, ANA, rheumatoid factor, ANCA and PFTs. We will try to get you back on CPAP.  You will need a right heart cath soon. We will see you in 1 month before arranging for it.

## 2016-01-10 NOTE — Progress Notes (Signed)
Subjective:    Patient ID: [name] [name], female    DOB: 03-05-1961, 55 y.o.   MRN: [id number]  HPI Consult for evaluation of pulmonary embolus, pulmonary hypertension. [name] is a 55 year old with past medical history of morbid obesity, obstructive sleep apnea, hypertension. She was admitted in January 2017 at [facility] for saddle embolus. She was initially on heparin anticoagulation and transition to Xarelto. Since discharge has been complaining of dyspnea, cough with white sputum production, episodes of dizziness, right chest pain. She was reevaluated with a CT scan which showed persistent emboli and an echocardiogram which showed severe pulmonary hypertension (see below). She is referred to the pulmonary clinic for further evaluation.  She has history of obstructive sleep apnea diagnosed in 2015. She had been maintained on CPAP. However she stopped using this about a couple of months ago. She is trying to get her equipment replaced so that she can resume her CPAP therapy.  DATA: CT scan 12/25/15 Right lower lobe and left lower lobe pulmonary embolism. Images reviewed  Echo 12/15/15  LVEF 55-60 percent. Normal cavity, wall thickness, systolic function. RV cavity size normal. Normal wall thickness and systolic function [id number]  Social History: She is a never smoker, no alcohol, drug use.  Family History: Lung cancer-grandmother.  Past Medical History  Diagnosis Date  . Pulmonary embolism ([place])   . Hypertension   . Obesity   . Arthritis   . Pneumonia   . Diverticulitis     Current outpatient prescriptions:  .  albuterol (PROVENTIL HFA;VENTOLIN HFA) 108 (90 Base) MCG/ACT inhaler, Inhale 1-2 puffs into the lungs every 6 (six) hours as needed for wheezing or shortness of breath., Disp: , Rfl:  .  ALPRAZolam (XANAX) 1 MG tablet, Take 1 mg by mouth 2 (two) times daily as needed for anxiety or sleep., Disp: , Rfl:  .  amLODipine (NORVASC) 5 MG tablet, Take 1 tablet  (5 mg total) by mouth daily., Disp: 90 tablet, Rfl: 3 .  atenolol (TENORMIN) 100 MG tablet, Take 100 mg by mouth daily., Disp: , Rfl:  .  budesonide-formoterol (SYMBICORT) 160-4.5 MCG/ACT inhaler, Inhale 2 puffs into the lungs 2 (two) times daily., Disp: , Rfl:  .  citalopram (CELEXA) 20 MG tablet, Take 1 tablet (20 mg total) by mouth daily., Disp: 30 tablet, Rfl: 3 .  dicyclomine (BENTYL) 20 MG tablet, Take 20 mg by mouth 2 (two) times daily., Disp: , Rfl:  .  docusate sodium (COLACE) 100 MG capsule, Take 100 mg by mouth 2 (two) times daily., Disp: , Rfl:  .  fluconazole (DIFLUCAN) 150 MG tablet, Take 1 tablet (150 mg total) by mouth once., Disp: 3 tablet, Rfl: 0 .  guaiFENesin-codeine (ROBITUSSIN AC) 100-10 MG/5ML syrup, Take 5 mLs by mouth 3 (three) times daily as needed for cough or congestion., Disp: , Rfl:  .  meclizine (ANTIVERT) 25 MG tablet, Take 1 tablet (25 mg total) by mouth 3 (three) times daily as needed for dizziness., Disp: 30 tablet, Rfl: 0 .  nystatin (MYCOSTATIN/NYSTOP) 100000 UNIT/GM POWD, Apply 5,000 g topically 3 (three) times daily. Vaginal area, Disp: 60 g, Rfl: 0 .  ondansetron (ZOFRAN) 4 MG tablet, Take 1 tablet (4 mg total) by mouth every 8 (eight) hours as needed for nausea or vomiting., Disp: 20 tablet, Rfl: 0 .  promethazine (PHENERGAN) 25 MG tablet, Take 25 mg by mouth every 6 (six) hours as needed for nausea or vomiting., Disp: , Rfl:  .  rivaroxaban (XARELTO) 20  MG TABS tablet, Take 1 tablet (20 mg total) by mouth daily with supper., Disp: 90 tablet, Rfl: 3 .  temazepam (RESTORIL) 7.5 MG capsule, Take 1 capsule (7.5 mg total) by mouth at bedtime as needed for sleep., Disp: 30 capsule, Rfl: 0 .  TRAVEL SICKNESS 25 MG CHEW, , Disp: , Rfl: 0 .  triamterene-hydrochlorothiazide (MAXZIDE-25) 37.5-25 MG tablet, Take 1 tablet by mouth daily., Disp: , Rfl:  .  zolpidem (AMBIEN) 5 MG tablet, Take 5 mg by mouth at bedtime as needed for sleep., Disp: , Rfl:   Review of  Systems Cough, sputum production, dyspnea on exertion, no hemoptysis. Right-sided pleuritic sharp chest pain, no palpitations. No nausea, vomiting, diarrhea, constipation. No fevers, chills, loss of weight, loss of appetite. All other review of systems are negative.    Objective:   Physical Exam Blood pressure 148/84, pulse 72, height 5' 4.5" (1.638 m), weight 328 lb (148.78 kg), SpO2 97 %. Gen: No apparent distress, Morbid obesity Neuro: No gross focal deficits. HEENT: No JVD, lymphadenopathy, thyromegaly. RS: Clear, No wheeze or crackles CVS: S1-S2 heard, no murmurs rubs gallops. Abdomen: Soft, positive bowel sounds. Musculoskeletal: No edema.    Assessment & Plan:  Acute PE Severe pulmonary hypertension Untreated OSA Morbid obesity  [name] has a recent acute PE. I don't have much records on hand but apparently it was a saddle embolus. She continues on Xarelto anticoagulation but has significant issues including dyspnea, pleuritic chest pain. Recent imaging reviewed. CT shows some clot in the right lower and left lower lobes and echo significant for severe pulmonary hypertension concerning for chonic thromboembolic disease. She'll need workup for pulmonary hypertension, possible right heart catheterization and consideration for Adempas. I'll send off basic workup including HIV, LFTs, ANA, RF, ANCA and PFTs.  She also has morbid obesity and likely severe OSA. She has been noncompliant with her CPAP. We will try to get the results of her previous hospitalization and her sleep study. We will need to get her back on the CPAP therapy.   Plan:  - Serologies for eval of PHTN - Work on resuming CPAP - Obtain records from OSH - Will likely need a RHC.  [name] Garfinkel MD [place] [facility] Pager [phone] If no answer or after 3pm call: [phone] 01/10/2016, 5:26 PM

## 2016-01-11 ENCOUNTER — Telehealth: Payer: Self-pay | Admitting: Internal Medicine

## 2016-01-11 LAB — ANA COMPREHENSIVE PANEL
Anti JO-1: <0.2 AI (ref 0.0–0.9)
Centromere Ab Screen: <0.2 AI (ref 0.0–0.9)
Chromatin Ab SerPl-aCnc: <0.2 AI (ref 0.0–0.9)
ENA RNP Ab: 0.3 AI (ref 0.0–0.9)
ENA SM Ab Ser-aCnc: <0.2 AI (ref 0.0–0.9)
ENA SSA (RO) Ab: <0.2 AI (ref 0.0–0.9)
ENA SSB (LA) Ab: <0.2 AI (ref 0.0–0.9)
Scleroderma SCL-70: <0.2 AI (ref 0.0–0.9)
dsDNA Ab: 1 IU/mL (ref 0–9)

## 2016-01-11 LAB — RHEUMATOID FACTOR: Rhuematoid fact SerPl-aCnc: <10 IU/mL (ref <=14)

## 2016-01-11 LAB — HIV ANTIBODY (ROUTINE TESTING W REFLEX): HIV 1&2 Ab, 4th Generation: NONREACTIVE

## 2016-01-11 LAB — ANCA SCREEN W REFLEX TITER: ANCA Screen: NEGATIVE

## 2016-01-11 NOTE — Telephone Encounter (Signed)
Pt. Called requesting a med refill on the following medications:  triamterene-hydrochlorothiazide (MAXZIDE-25) 37.5-25 MG tablet  atenolol (TENORMIN) 100 MG tablet  Pt. Is aware they were not prescribed by her PCP. Please f/u with pt.

## 2016-01-17 MED FILL — CITALOPRAM HBR 20 MG TABLET: 20 | 30 days supply | Qty: 30 | Fill #1

## 2016-01-18 MED FILL — XARELTO 20 MG TABLET: 20 | 30 days supply | Qty: 30 | Fill #2

## 2016-01-22 MED FILL — AMLODIPINE BESYLATE 5 MG TA: 5 | 30 days supply | Qty: 30 | Fill #1

## 2016-01-24 ENCOUNTER — Telehealth: Payer: Self-pay | Admitting: Internal Medicine

## 2016-01-24 NOTE — Telephone Encounter (Signed)
Patient called stated that she's had a Hysterectomy, however patient states that she's been experiencing vaginal bleeding, it's a pinkish color. Patient said she feels like she's having cramps Patient is very worried. Please follow up.

## 2016-01-24 NOTE — Telephone Encounter (Signed)
Patient needs to make appt regarding her symptoms.

## 2016-01-29 NOTE — Telephone Encounter (Signed)
Please have  Her make appt w/ me. thanks

## 2016-01-31 ENCOUNTER — Encounter: Payer: Self-pay | Admitting: Internal Medicine

## 2016-01-31 ENCOUNTER — Ambulatory Visit: Payer: Commercial Managed Care - HMO | Attending: Internal Medicine | Admitting: Internal Medicine

## 2016-01-31 VITALS — BP 150/99 | HR 98 | Temp 97.6°F | Wt 317.4 lb

## 2016-01-31 DIAGNOSIS — Z95828 Presence of other vascular implants and grafts: Secondary | ICD-10-CM | POA: Diagnosis not present

## 2016-01-31 DIAGNOSIS — I10 Essential (primary) hypertension: Secondary | ICD-10-CM

## 2016-01-31 DIAGNOSIS — G894 Chronic pain syndrome: Secondary | ICD-10-CM | POA: Diagnosis not present

## 2016-01-31 DIAGNOSIS — F32A Depression, unspecified: Secondary | ICD-10-CM

## 2016-01-31 DIAGNOSIS — F329 Major depressive disorder, single episode, unspecified: Secondary | ICD-10-CM | POA: Diagnosis not present

## 2016-01-31 DIAGNOSIS — I2692 Saddle embolus of pulmonary artery without acute cor pulmonale: Secondary | ICD-10-CM

## 2016-01-31 MED ORDER — ATENOLOL 100 MG PO TABS: 100.0000 mg | ORAL_TABLET | Freq: Every day | ORAL | Status: DC

## 2016-01-31 MED ORDER — DICLOFENAC SODIUM 1 % TD GEL: 2.0000 g | Freq: Four times a day (QID) | TRANSDERMAL | Status: DC

## 2016-01-31 MED ORDER — TRIAMTERENE-HCTZ 37.5-25 MG PO TABS: 1.0000 | ORAL_TABLET | Freq: Every day | ORAL | Status: DC

## 2016-01-31 MED ORDER — CITALOPRAM HYDROBROMIDE 40 MG PO TABS: 20.0000 mg | ORAL_TABLET | Freq: Every day | ORAL | Status: DC

## 2016-01-31 NOTE — Patient Instructions (Signed)
Low-Sodium Eating Plan Sodium raises blood pressure and causes water to be held in the body. Getting less sodium from food will help lower your blood pressure, reduce any swelling, and protect your heart, liver, and kidneys. We get sodium by adding salt (sodium chloride) to food. Most of our sodium comes from canned, boxed, and frozen foods. Restaurant foods, fast foods, and pizza are also very high in sodium. Even if you take medicine to lower your blood pressure or to reduce fluid in your body, getting less sodium from your food is important. WHAT IS MY PLAN? Most people should limit their sodium intake to 2,300 mg a day. Your health care provider recommends that you limit your sodium intake to 2 grams/ a day.  WHAT DO I NEED TO KNOW ABOUT THIS EATING PLAN? For the low-sodium eating plan, you will follow these general guidelines:  Choose foods with a % Daily Value for sodium of less than 5% (as listed on the food label).   Use salt-free seasonings or herbs instead of table salt or sea salt.   Check with your health care provider or pharmacist before using salt substitutes.   Eat fresh foods.  Eat more vegetables and fruits.  Limit canned vegetables. If you do use them, rinse them well to decrease the sodium.   Limit cheese to 1 oz (28 g) per day.   Eat lower-sodium products, often labeled as "lower sodium" or "no salt added."  Avoid foods that contain monosodium glutamate (MSG). MSG is sometimes added to [place] food and some canned foods.  Check food labels (Nutrition Facts labels) on foods to learn how much sodium is in one serving.  Eat more home-cooked food and less restaurant, buffet, and fast food.  When eating at a restaurant, ask that your food be prepared with less salt, or no salt if possible.  HOW DO I READ FOOD LABELS FOR SODIUM INFORMATION? The Nutrition Facts label lists the amount of sodium in one serving of the food. If you eat more than one serving, you  must multiply the listed amount of sodium by the number of servings. Food labels may also identify foods as:  Sodium free--Less than 5 mg in a serving.  Very low sodium--35 mg or less in a serving.  Low sodium--140 mg or less in a serving.  Light in sodium--50% less sodium in a serving. For example, if a food that usually has 300 mg of sodium is changed to become light in sodium, it will have 150 mg of sodium.  Reduced sodium--25% less sodium in a serving. For example, if a food that usually has 400 mg of sodium is changed to reduced sodium, it will have 300 mg of sodium. WHAT FOODS CAN I EAT? Grains Low-sodium cereals, including oats, puffed wheat and rice, and shredded wheat cereals. Low-sodium crackers. Unsalted rice and pasta. Lower-sodium bread.  Vegetables Frozen or fresh vegetables. Low-sodium or reduced-sodium canned vegetables. Low-sodium or reduced-sodium tomato sauce and paste. Low-sodium or reduced-sodium tomato and vegetable juices.  Fruits Fresh, frozen, and canned fruit. Fruit juice.  Meat and Other Protein Products Low-sodium canned tuna and salmon. Fresh or frozen meat, poultry, seafood, and fish. Lamb. Unsalted nuts. Dried beans, peas, and lentils without added salt. Unsalted canned beans. Homemade soups without salt. Eggs.  Dairy Milk. Soy milk. Ricotta cheese. Low-sodium or reduced-sodium cheeses. Yogurt.  Condiments Fresh and dried herbs and spices. Salt-free seasonings. Onion and garlic powders. Low-sodium varieties of mustard and ketchup. Fresh or refrigerated horseradish.  Lemon juice.  Fats and Oils Reduced-sodium salad dressings. Unsalted butter.  Other Unsalted popcorn and pretzels.  The items listed above may not be a complete list of recommended foods or beverages. Contact your dietitian for more options. WHAT FOODS ARE NOT RECOMMENDED? Grains Instant hot cereals. Bread stuffing, pancake, and biscuit mixes. Croutons. Seasoned rice or pasta mixes.  Noodle soup cups. Boxed or frozen macaroni and cheese. Self-rising flour. Regular salted crackers. Vegetables Regular canned vegetables. Regular canned tomato sauce and paste. Regular tomato and vegetable juices. Frozen vegetables in sauces. Salted [place] fries. Olives. Angie Fava. Relishes. Sauerkraut. Salsa. Meat and Other Protein Products Salted, canned, smoked, spiced, or pickled meats, seafood, or fish. Bacon, ham, sausage, hot dogs, corned beef, chipped beef, and packaged luncheon meats. Salt pork. Jerky. Pickled herring. Anchovies, regular canned tuna, and sardines. Salted nuts. Dairy Processed cheese and cheese spreads. Cheese curds. Blue cheese and cottage cheese. Buttermilk.  Condiments Onion and garlic salt, seasoned salt, table salt, and sea salt. Canned and packaged gravies. Worcestershire sauce. Tartar sauce. Barbecue sauce. Teriyaki sauce. Soy sauce, including reduced sodium. Steak sauce. Fish sauce. Oyster sauce. Cocktail sauce. Horseradish that you find on the shelf. Regular ketchup and mustard. Meat flavorings and tenderizers. Bouillon cubes. Hot sauce. Tabasco sauce. Marinades. Taco seasonings. Relishes. Fats and Oils Regular salad dressings. Salted butter. Margarine. Ghee. Bacon fat.  Other Potato and tortilla chips. Corn chips and puffs. Salted popcorn and pretzels. Canned or dried soups. Pizza. Frozen entrees and pot pies.  The items listed above may not be a complete list of foods and beverages to avoid. Contact your dietitian for more information.   This information is not intended to replace advice given to you by your health care provider. Make sure you discuss any questions you have with your health care provider.   Document Released: 02/15/2002 Document Revised: 09/16/2014 Document Reviewed: 06/30/2013 Elsevier Interactive Patient Education [employer].

## 2016-01-31 NOTE — Progress Notes (Signed)
[name] [name], is a 55 y.o. female  [id number]  [id number]  DOB - 1960/11/26  Chief Complaint  Patient presents with  . Follow-up    SOB; chest sharps has gotten a little better        Subjective:   [name] [name] is a 55 y.o. female here today for a follow up visit for htn and bilateral PE, last seen in clinic 12/12/15.  Since than, pt has seen Pulmonologist Dr Vaughan Browner on 01/10/16.  Initially rcd [facility], but pt has Smart port in right Chest wall, so pt said now planning to do PFTs first on 02/15/16.    Here w/ numerous complaints today.  1. PE: Pt states her breathing slightly improved, but still has significant DOE and intermittent right Pleuritic chest pain.      2. Anxiety/depression: She is very anxious/depressed, tearful about her medical problems. She also has ongoing leg /knee pains, but surgery not an option due to her PE.  No si/hi/avh.  Per pt, she has not noticed much difference in anxiety/depression on celexa 20 qday. Denies si./hi/avh. She really wants to try to get better and do all possible recommendations.  3. Pain: I offered referral to Pain mgmt, for possible injection or rx, but she is worried about cost.  She wants to try meds in our clinic first, ie Voltarin gel I offered today.  4. Htn: Pt ran out of her bp meds, atenolol and maxzide about 1 month ago, still taking the norvasc 5 mg though.  She said she thought she called our clinic about this, but I could not find any documentation of this.  She says she may have mixed things up due to all her anxiety.  5. Vaginal bleeding "pink tinged" transiently 5/17, resolved next day.  But she says she has "mentrual cramp feelings" over her diffuse abdomen x 3 wks, normal BMs, post hysterectomy.  6. Has Smart Cath placed by Surgeon in another town for difficult blood draws, per pt needs monthly flushes.  She had it flushed last month when she had CT chest, but does not know where to go to have this done again.  7. Financial  difficulties, no car.  Patient has No headache, No chest pain, No abdominal pain - No Nausea, No new weakness tingling or numbness, No Cough.   Here today w/ her husband.  No problems updated.  ALLERGIES: No Known Allergies  PAST MEDICAL HISTORY: Past Medical History  Diagnosis Date  . Pulmonary embolism ([place])   . Hypertension   . Obesity   . Arthritis   . Pneumonia   . Diverticulitis     MEDICATIONS AT HOME: Prior to Admission medications   Medication Sig Start Date End Date Taking? Authorizing Provider  albuterol (PROVENTIL HFA;VENTOLIN HFA) 108 (90 Base) MCG/ACT inhaler Inhale 1-2 puffs into the lungs every 6 (six) hours as needed for wheezing or shortness of breath.   Yes Historical Provider, MD  ALPRAZolam Duanne Moron) 1 MG tablet Take 1 mg by mouth 2 (two) times daily as needed for anxiety or sleep.   Yes Historical Provider, MD  amLODipine (NORVASC) 5 MG tablet Take 1 tablet (5 mg total) by mouth daily. 12/12/15  Yes Maren Reamer, MD  budesonide-formoterol (SYMBICORT) 160-4.5 MCG/ACT inhaler Inhale 2 puffs into the lungs 2 (two) times daily.   Yes Historical Provider, MD  citalopram (CELEXA) 40 MG tablet Take 0.5 tablets (20 mg total) by mouth daily. 01/31/16  Yes Maren Reamer, MD  dicyclomine (BENTYL)  20 MG tablet Take 20 mg by mouth 2 (two) times daily.   Yes Historical Provider, MD  docusate sodium (COLACE) 100 MG capsule Take 100 mg by mouth 2 (two) times daily.   Yes Historical Provider, MD  rivaroxaban (XARELTO) 20 MG TABS tablet Take 1 tablet (20 mg total) by mouth daily with supper. 12/25/15  Yes Tresa Garter, MD  zolpidem (AMBIEN) 5 MG tablet Take 5 mg by mouth at bedtime as needed for sleep.   Yes Historical Provider, MD  atenolol (TENORMIN) 100 MG tablet Take 1 tablet (100 mg total) by mouth daily. Reported on 01/31/2016 01/31/16   Maren Reamer, MD  diclofenac sodium (VOLTAREN) 1 % GEL Apply 2 g topically 4 (four) times daily. 01/31/16   Maren Reamer,  MD  fluconazole (DIFLUCAN) 150 MG tablet Take 1 tablet (150 mg total) by mouth once. Patient not taking: Reported on 01/31/2016 11/20/15   Maren Reamer, MD  guaiFENesin-codeine [facility]) 100-10 MG/5ML syrup Take 5 mLs by mouth 3 (three) times daily as needed for cough or congestion. Reported on 01/31/2016    Historical Provider, MD  meclizine (ANTIVERT) 25 MG tablet Take 1 tablet (25 mg total) by mouth 3 (three) times daily as needed for dizziness. Patient not taking: Reported on 01/31/2016 11/15/15   Willeen Niece, MD  nystatin (MYCOSTATIN/NYSTOP) 100000 UNIT/GM POWD Apply 5,000 g topically 3 (three) times daily. Vaginal area Patient not taking: Reported on 01/31/2016 11/20/15   Maren Reamer, MD  ondansetron (ZOFRAN) 4 MG tablet Take 1 tablet (4 mg total) by mouth every 8 (eight) hours as needed for nausea or vomiting. Patient not taking: Reported on 01/31/2016 12/19/15   Maren Reamer, MD  promethazine (PHENERGAN) 25 MG tablet Take 25 mg by mouth every 6 (six) hours as needed for nausea or vomiting. Reported on 01/31/2016    Historical Provider, MD  temazepam (RESTORIL) 7.5 MG capsule Take 1 capsule (7.5 mg total) by mouth at bedtime as needed for sleep. Patient not taking: Reported on 01/31/2016 12/12/15   Maren Reamer, MD  TRAVEL SICKNESS 25 MG CHEW Reported on 01/31/2016 11/15/15   Historical Provider, MD  triamterene-hydrochlorothiazide (MAXZIDE-25) 37.5-25 MG tablet Take 1 tablet by mouth daily. Reported on 01/31/2016 01/31/16   Maren Reamer, MD     Objective:   Filed Vitals:   01/31/16 1121  BP: 150/99  Pulse: 98  Temp: 97.6 F (36.4 C)  TempSrc: Oral  Weight: 317 lb 6.4 oz (143.972 kg)  SpO2: 91%    Exam General appearance : Awake, alert, not in any distress. Speech Clear. Not toxic looking, anxious/tearful today.  Speaking in full sentences today, not dyspnea when talking to me today (which is actually improved compared to prior visit) HEENT: Atraumatic and  Normocephalic, pupils equally reactive to light. Neck: supple, no JVD. No cervical lymphadenopathy.  Chest: Good air entry bilaterally, no added sounds.  Not using accessory muscles. CVS: S1 S2 regular, no murmurs/gallups or rubs. Abdomen: Bowel sounds active, obese, Non tender to palpation today, no palpable masses, limited exam given body habitus. Extremities: B/L Lower Ext shows no edema, both legs are warm to touch Neurology: Awake alert, and oriented X 3, CN II-XII grossly intact, Non focal Skin:No Rash Psyche/ tearful/anxious /depressed, thought processes normal/appropriate.    Data Review No results found for: HGBA1C  Depression screen [facility] 2/9 01/31/2016 11/20/2015 11/15/2015 11/15/2015  Decreased Interest 2 0 0 0  Down, Depressed, Hopeless 2 0 2  0  PHQ - 2 Score 4 0 2 0  Altered sleeping 3 - 3 -  Tired, decreased energy 2 - 3 -  Change in appetite 2 - 2 -  Feeling bad or failure about yourself  1 - 3 -  Trouble concentrating 1 - 3 -  Moving slowly or fidgety/restless 0 - 1 -  Suicidal thoughts 0 - 0 -  PHQ-9 Score 13 - 17 -  Difficult doing work/chores Somewhat difficult - - -      Assessment & Plan   1.  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 - 02 sats 91 % today w/ ambulation, does not qualify for home o2. - appreciate Pulm assistance, has PFTs pending 6/8, encouraged pt to keep this appt. - continue Xarelto  2. htn - Ran out of her atenolol and maxzide, I renewed - encouragd to call us when run out of meds, numerous refills given - continue norvasc - low salt/DASH diet discussed at length  3. Anxiety/depression - Significant, likely associated w/ medical comordities and social eco problems - Increased celexa 40mg  qd,   4. Port catheter in place (brand: [employer]) - Needs monthly flushes, we do not do in our clinic, - Ambulatory referral to Oncology referral for further eval.  5. Chronic pain syndrome, esp knees - pt has been  told prior she needs surgery by ortho when she was admitted to OSH for PE, but due to her PEs, very high risk surgery and no surgeon would rcd at this time - of note, I offered PMR referral, but she wants to hold off on that but cannot afford copays, etc at this time - trial Voltarin gel for now.  6. Morbid obesity bmi 55, makes everything worse.  7. Social /economic - setting pt up w/ food box and food pantry today as well.  8. "menstrual cramps" and transient vaginal bleeding ("pink tinged" on 5/17 - resolved) - she is post hysterectomy,  I am not certain if this is gi related vs anxiety vs other cause - will f/u.  9. Health maintenance - will need MM and coloscopy screening, but given finances and current PEs/medical problems, pt wants to defer for now.  Patient have been counseled extensively about nutrition and exercise  Return in about 3 weeks (around 02/21/2016) for pulm emboli/anxiety/htn.  The patient was given clear instructions to go to ER or return to medical center if symptoms don't improve, worsen or new problems develop. The patient verbalized understanding. The patient was told to call to get lab results if they haven't heard anything in the next week.    Maren Reamer, MD, [facility] [place], [place]   01/31/2016, 12:03 PM

## 2016-02-06 ENCOUNTER — Other Ambulatory Visit: Payer: Self-pay | Admitting: Internal Medicine

## 2016-02-06 MED ORDER — LIDOCAINE-PRILOCAINE (BULK) 2.5-2.5 % CREA: 1.0000 "application " | TOPICAL_CREAM | Freq: Two times a day (BID) | Status: DC | PRN

## 2016-02-08 ENCOUNTER — Emergency Department (HOSPITAL_COMMUNITY): Payer: Commercial Managed Care - HMO

## 2016-02-08 ENCOUNTER — Encounter (HOSPITAL_COMMUNITY): Payer: Self-pay | Admitting: Emergency Medicine

## 2016-02-08 ENCOUNTER — Emergency Department (HOSPITAL_COMMUNITY)
Admission: EM | Admit: 2016-02-08 | Discharge: 2016-02-08 | Disposition: A | Discharge status: Home / Self Care | Payer: Commercial Managed Care - HMO | Attending: Emergency Medicine | Admitting: Emergency Medicine

## 2016-02-08 DIAGNOSIS — R0602 Shortness of breath: Secondary | ICD-10-CM | POA: Diagnosis not present

## 2016-02-08 DIAGNOSIS — I2699 Other pulmonary embolism without acute cor pulmonale: Secondary | ICD-10-CM | POA: Diagnosis not present

## 2016-02-08 DIAGNOSIS — R918 Other nonspecific abnormal finding of lung field: Secondary | ICD-10-CM | POA: Diagnosis not present

## 2016-02-08 DIAGNOSIS — I10 Essential (primary) hypertension: Secondary | ICD-10-CM | POA: Insufficient documentation

## 2016-02-08 DIAGNOSIS — Z7902 Long term (current) use of antithrombotics/antiplatelets: Secondary | ICD-10-CM | POA: Diagnosis not present

## 2016-02-08 DIAGNOSIS — Z7901 Long term (current) use of anticoagulants: Secondary | ICD-10-CM | POA: Diagnosis not present

## 2016-02-08 DIAGNOSIS — Z79899 Other long term (current) drug therapy: Secondary | ICD-10-CM | POA: Diagnosis not present

## 2016-02-08 DIAGNOSIS — I2782 Chronic pulmonary embolism: Secondary | ICD-10-CM | POA: Diagnosis not present

## 2016-02-08 LAB — BASIC METABOLIC PANEL
Anion gap: 8 (ref 5–15)
BUN: 9 mg/dL (ref 6–20)
CO2: 24 mmol/L (ref 22–32)
Calcium: 9.9 mg/dL (ref 8.9–10.3)
Chloride: 108 mmol/L (ref 101–111)
Creatinine, Ser: 1.19 mg/dL — ABNORMAL HIGH (ref 0.44–1.00)
GFR calc Af Amer: 59 mL/min — ABNORMAL LOW (ref >60)
GFR calc non Af Amer: 51 mL/min — ABNORMAL LOW (ref >60)
Glucose, Bld: 99 mg/dL (ref 65–99)
Potassium: 3.7 mmol/L (ref 3.5–5.1)
Sodium: 140 mmol/L (ref 135–145)

## 2016-02-08 LAB — CBC
HCT: 38.1 % (ref 36.0–46.0)
Hemoglobin: 12.1 g/dL (ref 12.0–15.0)
MCH: 21.3 pg — ABNORMAL LOW (ref 26.0–34.0)
MCHC: 31.8 g/dL (ref 30.0–36.0)
MCV: 67 fL — ABNORMAL LOW (ref 78.0–100.0)
Platelets: 299 10*3/uL (ref 150–400)
RBC: 5.69 MIL/uL — ABNORMAL HIGH (ref 3.87–5.11)
RDW: 16.2 % — ABNORMAL HIGH (ref 11.5–15.5)
WBC: 7 10*3/uL (ref 4.0–10.5)

## 2016-02-08 LAB — I-STAT TROPONIN, ED: Troponin i, poc: 0 ng/mL (ref 0.00–0.08)

## 2016-02-08 MED ORDER — OXYCODONE-ACETAMINOPHEN 5-325 MG PO TABS
1.0000 | ORAL_TABLET | Freq: Once | ORAL | Status: AC
  Administered 2016-02-08: 1 via ORAL
  Filled 2016-02-08: qty 1

## 2016-02-08 MED ORDER — OXYCODONE-ACETAMINOPHEN 5-325 MG PO TABS: 1.0000 | ORAL_TABLET | Freq: Four times a day (QID) | ORAL | Status: DC | PRN

## 2016-02-08 MED ORDER — FENTANYL CITRATE (PF) 100 MCG/2ML IJ SOLN
50.0000 ug | INTRAMUSCULAR | Status: DC | PRN
  Administered 2016-02-08: 50 ug via INTRAVENOUS
  Filled 2016-02-08: qty 2

## 2016-02-08 MED ORDER — HEPARIN SOD (PORK) LOCK FLUSH 100 UNIT/ML IV SOLN
500.0000 [IU] | INTRAVENOUS | Status: AC | PRN
  Administered 2016-02-08: 500 [IU]

## 2016-02-08 MED ORDER — IOPAMIDOL (ISOVUE-370) INJECTION 76%
INTRAVENOUS | Status: AC
  Administered 2016-02-08: 100 mL
  Filled 2016-02-08: qty 100

## 2016-02-08 MED ORDER — SODIUM CHLORIDE 0.9% FLUSH: 10.0000 mL | INTRAVENOUS | Status: DC | PRN

## 2016-02-08 MED ORDER — SODIUM CHLORIDE 0.9 % IV BOLUS (SEPSIS)
1000.0000 mL | Freq: Once | INTRAVENOUS | Status: AC
  Administered 2016-02-08: 1000 mL via INTRAVENOUS

## 2016-02-08 MED ORDER — ALBUTEROL SULFATE HFA 108 (90 BASE) MCG/ACT IN AERS: 1.0000 | INHALATION_SPRAY | Freq: Four times a day (QID) | RESPIRATORY_TRACT | Status: DC | PRN

## 2016-02-08 MED FILL — ATENOLOL 50 MG TABLET: 50 | 90 days supply | Qty: 180 | Fill #0

## 2016-02-08 NOTE — ED Notes (Signed)
Pt stable, ambulatory, states understanding of discharge instructions 

## 2016-02-08 NOTE — ED Notes (Signed)
Pt states to SOB to ambulate at this time

## 2016-02-08 NOTE — ED Notes (Signed)
Pt would like power port accessed if needed

## 2016-02-08 NOTE — ED Notes (Signed)
IV team consulted for port access

## 2016-02-08 NOTE — Discharge Instructions (Signed)
Your pulmonary embolism is the same as on prior CAT scans. Continue to take your Xarelto.  See your pulmonologist and cardiologist.  Use your albuterol inhaler as needed for shortness of breath. Take Motrin and Tylenol for pain.

## 2016-02-08 NOTE — ED Notes (Signed)
Pt c/o chest pain, sob, seeing pulmonary doctor for blood clots in lungs. Pt on xarelto. C/o pain and SOB since yesterday. Pt speaking in short sentences, difficulty catching her breath.

## 2016-02-08 NOTE — ED Notes (Signed)
IV team at bedside 

## 2016-02-08 NOTE — ED Notes (Signed)
Pt ambulated independently in hallway with resident at side.

## 2016-02-08 NOTE — ED Provider Notes (Signed)
CSN: [id number]     Arrival date & time 02/08/16  1706 History   First MD Initiated Contact with Patient 02/08/16 1733     Chief Complaint  Patient presents with  . Chest Pain  . Shortness of Breath     (Consider location/radiation/quality/duration/timing/severity/associated sxs/prior Treatment) Patient is a 55 y.o. female presenting with general illness. The history is provided by the patient and medical records.  Illness Location:  Chest pain, shortness of breath Severity:  Moderate Onset quality:  Gradual Duration:  3 months Timing:  Intermittent Progression:  Unchanged Chronicity:  Recurrent Context:  History of pulmonary embolisms, on Xarelto. Being followed by pulmonology. This supposed to have a cardiac cath to evaluate for pulmonary hypertension. Today's presenting with intermittent shortness of breath and chest pain, consistent with prior PEs. Endorses lightheadedness. Endorses cough. No fevers or chills. Associated symptoms: chest pain, cough and shortness of breath   Associated symptoms: no abdominal pain, no diarrhea, no fever, no headaches, no nausea, no vomiting and no wheezing     Past Medical History  Diagnosis Date  . Pulmonary embolism ([place])   . Hypertension   . Obesity   . Arthritis   . Pneumonia   . Diverticulitis    Past Surgical History  Procedure Laterality Date  . Cholecystectomy    . Breast reduction surgery    . Abdominal hysterectomy     Family History  Problem Relation Age of Onset  . Lung cancer Maternal Grandmother    Social History  Substance Use Topics  . Smoking status: Never Smoker   . Smokeless tobacco: None  . Alcohol Use: No   OB History    No data available     Review of Systems  Constitutional: Negative for fever and chills.  Respiratory: Positive for cough and shortness of breath. Negative for wheezing.   Cardiovascular: Positive for chest pain and leg swelling.  Gastrointestinal: Negative for nausea, vomiting, abdominal  pain and diarrhea.  Musculoskeletal: Negative for back pain.  Neurological: Positive for light-headedness. Negative for syncope and headaches.  All other systems reviewed and are negative.     Allergies  Review of patient's allergies indicates no known allergies.  Home Medications   Prior to Admission medications   Medication Sig Start Date End Date Taking? Authorizing Provider  ALPRAZolam Duanne Moron) 1 MG tablet Take 1 mg by mouth 2 (two) times daily as needed for anxiety or sleep.   Yes Historical Provider, MD  amLODipine (NORVASC) 5 MG tablet Take 1 tablet (5 mg total) by mouth daily. 12/12/15  Yes Maren Reamer, MD  atenolol (TENORMIN) 100 MG tablet Take 1 tablet (100 mg total) by mouth daily. Reported on 01/31/2016 01/31/16  Yes Maren Reamer, MD  budesonide-formoterol [facility]) 160-4.5 MCG/ACT inhaler Inhale 2 puffs into the lungs 2 (two) times daily.   Yes Historical Provider, MD  citalopram (CELEXA) 20 MG tablet Take 20 mg by mouth daily. 01/17/16  Yes Historical Provider, MD  dicyclomine (BENTYL) 20 MG tablet Take 20 mg by mouth 2 (two) times daily.   Yes Historical Provider, MD  meclizine (ANTIVERT) 25 MG tablet Take 1 tablet (25 mg total) by mouth 3 (three) times daily as needed for dizziness. 11/15/15  Yes Willeen Niece, MD  polyethylene glycol [facility] / GLYCOLAX) packet Take 17 g by mouth daily as needed for mild constipation.   Yes Historical Provider, MD  rivaroxaban (XARELTO) 20 MG TABS tablet Take 1 tablet (20 mg total) by mouth daily with  supper. 12/25/15  Yes Tresa Garter, MD  triamterene-hydrochlorothiazide (MAXZIDE-25) 37.5-25 MG tablet Take 1 tablet by mouth daily. Reported on 01/31/2016 01/31/16  Yes Dawn Lazarus Gowda, MD  zolpidem (AMBIEN) 5 MG tablet Take 5 mg by mouth at bedtime as needed for sleep.   Yes Historical Provider, MD  albuterol (PROVENTIL HFA;VENTOLIN HFA) 108 (90 Base) MCG/ACT inhaler Inhale 1-2 puffs into the lungs every 6 (six) hours as needed for  wheezing or shortness of breath. 02/08/16   Maryan Puls, MD  citalopram (CELEXA) 40 MG tablet Take 0.5 tablets (20 mg total) by mouth daily. 01/31/16   Maren Reamer, MD  diclofenac sodium (VOLTAREN) 1 % GEL Apply 2 g topically 4 (four) times daily. 01/31/16   Maren Reamer, MD  fluconazole (DIFLUCAN) 150 MG tablet Take 1 tablet (150 mg total) by mouth once. Patient not taking: Reported on 01/31/2016 11/20/15   Maren Reamer, MD  Lidocaine-Prilocaine, Bulk, A999333 % CREA 1 application by Does not apply route 2 (two) times daily as needed. Patient taking differently: Apply 1 application topically 2 (two) times daily as needed.  02/06/16   Maren Reamer, MD  nystatin (MYCOSTATIN/NYSTOP) 100000 UNIT/GM POWD Apply 5,000 g topically 3 (three) times daily. Vaginal area Patient not taking: Reported on 01/31/2016 11/20/15   Maren Reamer, MD  ondansetron (ZOFRAN) 4 MG tablet Take 1 tablet (4 mg total) by mouth every 8 (eight) hours as needed for nausea or vomiting. Patient not taking: Reported on 01/31/2016 12/19/15   Maren Reamer, MD  oxyCODONE-acetaminophen (PERCOCET/ROXICET) 5-325 MG tablet Take 1 tablet by mouth every 6 (six) hours as needed for severe pain. 02/08/16   Maryan Puls, MD  temazepam (RESTORIL) 7.5 MG capsule Take 1 capsule (7.5 mg total) by mouth at bedtime as needed for sleep. Patient not taking: Reported on 01/31/2016 12/12/15   Maren Reamer, MD   BP 148/87 mmHg  Pulse 76  Temp(Src) 98.2 F (36.8 C)  Resp 14  SpO2 98% Physical Exam  Constitutional: She is oriented to person, place, and time. She appears well-developed and well-nourished. No distress.  HENT:  Head: Normocephalic and atraumatic.  Eyes: EOM are normal. Pupils are equal, round, and reactive to light.  Neck: Normal range of motion.  Cardiovascular: Regular rhythm.  Tachycardia present.   Pulmonary/Chest: Tachypnea noted. No respiratory distress. She has no wheezes. She has no rhonchi.  Abdominal: Soft.  Normal appearance. There is no tenderness.  Musculoskeletal:       Right lower leg: She exhibits no swelling and no edema.       Left lower leg: She exhibits no swelling and no edema.  Neurological: She is alert and oriented to person, place, and time. GCS eye subscore is 4. GCS verbal subscore is 5. GCS motor subscore is 6.  Skin: Skin is warm and dry.    ED Course  Procedures (including critical care time) Labs Review Labs Reviewed  BASIC METABOLIC PANEL - Abnormal; Notable for the following:    Creatinine, Ser 1.19 (*)    GFR calc non Af Amer 51 (*)    GFR calc Af Amer 59 (*)    All other components within normal limits  CBC - Abnormal; Notable for the following:    RBC 5.69 (*)    MCV 67.0 (*)    MCH 21.3 (*)    RDW 16.2 (*)    All other components within normal limits  BRAIN NATRIURETIC PEPTIDE  I-STAT TROPOININ, ED  Imaging Review Dg Chest 2 View  02/08/2016  CLINICAL DATA:  Acute onset of shortness of breath, dizziness and nausea. Stabbing chest pain and dry cough. Initial encounter. EXAM: CHEST  2 VIEW COMPARISON:  Chest radiograph performed 11/12/2015, and CTA of the chest performed 12/25/2015 FINDINGS: The lungs are well-aerated. Mild bibasilar opacities may reflect mild pneumonia. There is no evidence of pleural effusion or pneumothorax. The heart is borderline enlarged. A right-sided chest port is noted ending about the distal SVC. No acute osseous abnormalities are seen. IMPRESSION: Mild bibasilar opacities may reflect mild pneumonia. Borderline cardiomegaly Electronically Signed   By: Garald Balding M.D.   On: 02/08/2016 18:35   Ct Angio Chest Pe W/cm &/or Wo Cm  02/08/2016  CLINICAL DATA:  Evaluate for worsening clot burden. Sob and right side chest and back pain x 2 days but worsening last night. Hx PE, HTN, PNA and breast reduction. EXAM: CT ANGIOGRAPHY CHEST WITH CONTRAST TECHNIQUE: Multidetector CT imaging of the chest was performed using the standard protocol  during bolus administration of intravenous contrast. Multiplanar CT image reconstructions and MIPs were obtained to evaluate the vascular anatomy. CONTRAST:  100 mL of Isovue 370 intravenous contrast COMPARISON:  Current chest radiograph.  Chest CTA, 12/25/2015. FINDINGS: Angiographic study: There is occlusive thrombus in the right lower lobe pulmonary artery extending to the lower lobe segmental branches, but sparing the superior segmental branch. There has been no significant change in this thrombus since the prior study. There are no other areas of pulmonary emboli. Specifically, there is no evidence of a new pulmonary embolus since the prior exam. The aorta is normal in caliber. There is no dissection. No significant aortic plaque. Neck base and axilla:  No mass or adenopathy. Mediastinum and hila: Mild cardiomegaly. No mediastinal or hilar masses or adenopathy. Lungs and pleura: Subsegmental atelectasis, most evident in the lower lobes, without significant change from the prior CT. No evidence of pneumonia or pulmonary edema. No pleural effusion. Limited upper abdomen: Status post cholecystectomy. 14 mm right adrenal adenoma. No acute findings. Musculoskeletal: Degenerative changes along the thoracic spine. No osteoblastic or osteolytic lesions. Review of the MIP images confirms the above findings. IMPRESSION: 1. Right lower lobe occlusive pulmonary embolism without change from the prior CTA. 2. No evidence of a new pulmonary embolus. 3. Subsegmental atelectasis in the lungs similar to the prior CTA. No acute findings in the lungs. 4. Stable mild cardiomegaly. Electronically Signed   By: Lajean Manes M.D.   On: 02/08/2016 20:11   I have personally reviewed and evaluated these images and lab results as part of my medical decision-making.   EKG Interpretation   Date/Time:  Thursday February 08 2016 17:15:57 EDT Ventricular Rate:  110 PR Interval:  170 QRS Duration: 60 QT Interval:  320 QTC Calculation:  433 R Axis:   85 Text Interpretation:  Sinus tachycardia Otherwise normal ECG Confirmed by  [facility] MD, Corene Cornea [phone]) on 02/08/2016 6:56:15 PM      MDM   Final diagnoses:  Other chronic pulmonary embolism without acute cor pulmonale (HCC)  SOB (shortness of breath)    Philip the patient's chest pain and shortness of breath or related to her known chronic pulmonary embolism's. She is taking her Xarelto as prescribed. She is being followed appropriately by cardiology and pulmonology. She is due to have a heart cath done, which I feel is appropriate. She reports that her pain is consistent with her prior pain, she was just concerned of the persistence  of it. Got a CAT scan here which showed no change in size of her pulmonary embolisms. Doubt worsening clot load or failed Xarelto therapy. EKG showed sinus tachycardia but was consistent with her prior. No ischemic changes. No interval abnormalities. Troponin undetectable. Doubt ACS. There is mention of mild bibasilar opacities in the chest x-ray; however the patient does not clinically have a pneumonia and there is no mention of any findings consistent with pneumonia on her CAT scan. Afebrile here. No leukocytosis. Doubt and active infectious process. Description of symptoms does not seem consistent with aortic dissection. No mention of dissection on CAT scan. No evidence of pneumothorax on imaging.  Strict return precautions provided. Patient was able to ambulate throughout the emergency department without issue or desaturations of her oxygenation. Encouraged her to continue to take her albuterol inhalers at home. Encouraged her to keep her outpatient appointments with her specialist. Encouraged her to take Motrin and Tylenol as needed for pain.  Patient discharged in stable condition.  Maryan Puls, MD 02/08/16 [id number]  Merrily Pew, MD 02/08/16 2325

## 2016-02-12 MED FILL — CITALOPRAM HBR 40 MG TABLET: 40 | 90 days supply | Qty: 45 | Fill #0

## 2016-02-13 MED FILL — LIDOCAINE-PRILOCAINE CREAM: 2.5-2.5 | 15 days supply | Qty: 30 | Fill #0

## 2016-02-15 ENCOUNTER — Ambulatory Visit (INDEPENDENT_AMBULATORY_CARE_PROVIDER_SITE_OTHER): Payer: Commercial Managed Care - HMO | Admitting: Pulmonary Disease

## 2016-02-15 ENCOUNTER — Encounter: Payer: Self-pay | Admitting: Pulmonary Disease

## 2016-02-15 ENCOUNTER — Ambulatory Visit (HOSPITAL_COMMUNITY)
Admission: RE | Admit: 2016-02-15 | Discharge: 2016-02-15 | Disposition: A | Discharge status: Home / Self Care | Payer: Commercial Managed Care - HMO | Source: Ambulatory Visit | Attending: Pulmonary Disease | Admitting: Pulmonary Disease

## 2016-02-15 ENCOUNTER — Ambulatory Visit: Payer: 59 | Admitting: Pulmonary Disease

## 2016-02-15 VITALS — BP 128/88 | HR 92 | Ht 65.0 in | Wt 317.0 lb

## 2016-02-15 DIAGNOSIS — R0600 Dyspnea, unspecified: Secondary | ICD-10-CM | POA: Insufficient documentation

## 2016-02-15 DIAGNOSIS — I272 Other secondary pulmonary hypertension: Secondary | ICD-10-CM

## 2016-02-15 DIAGNOSIS — I2699 Other pulmonary embolism without acute cor pulmonale: Secondary | ICD-10-CM | POA: Diagnosis not present

## 2016-02-15 DIAGNOSIS — G4733 Obstructive sleep apnea (adult) (pediatric): Secondary | ICD-10-CM

## 2016-02-15 LAB — PULMONARY FUNCTION TEST
DL/VA % pred: 90 %
DL/VA: 4.38 ml/min/mmHg/L
DLCO cor % pred: 61 %
DLCO cor: 15.35 ml/min/mmHg
DLCO unc % pred: 58 %
DLCO unc: 14.7 ml/min/mmHg
FEF 25-75 Post: 2.52 L/sec
FEF 25-75 Pre: 1.81 L/sec
FEF2575-%Change-Post: 39 %
FEF2575-%Pred-Post: 107 %
FEF2575-%Pred-Pre: 77 %
FEV1-%Change-Post: 14 %
FEV1-%Pred-Post: 95 %
FEV1-%Pred-Pre: 83 %
FEV1-Post: 2.19 L
FEV1-Pre: 1.92 L
FEV1FVC-%Change-Post: 4 %
FEV1FVC-%Pred-Pre: 97 %
FEV6-%Change-Post: 9 %
FEV6-%Pred-Post: 95 %
FEV6-%Pred-Pre: 87 %
FEV6-Post: 2.66 L
FEV6-Pre: 2.44 L
FEV6FVC-%Pred-Post: 103 %
FEV6FVC-%Pred-Pre: 103 %
FVC-%Change-Post: 9 %
FVC-%Pred-Post: 92 %
FVC-%Pred-Pre: 84 %
FVC-Post: 2.66 L
FVC-Pre: 2.44 L
Post FEV1/FVC ratio: 82 %
Post FEV6/FVC ratio: 100 %
Pre FEV1/FVC ratio: 79 %
Pre FEV6/FVC Ratio: 100 %
RV % pred: 127 %
RV: 2.4 L
TLC % pred: 91 %
TLC: 4.68 L

## 2016-02-15 MED ORDER — ALBUTEROL SULFATE (2.5 MG/3ML) 0.083% IN NEBU
2.5000 mg | INHALATION_SOLUTION | Freq: Once | RESPIRATORY_TRACT | Status: AC
  Administered 2016-02-15: 2.5 mg via RESPIRATORY_TRACT

## 2016-02-15 NOTE — Patient Instructions (Signed)
We are going to refer you to [facility] and the coumadin clinic. An order has been sent to a home care company to get you new CPAP supplies.

## 2016-02-15 NOTE — Progress Notes (Addendum)
Subjective:    Patient ID: [name] [name], female    DOB: 11-07-1960, 55 y.o.   MRN: [id number]  HPI Consult for evaluation of pulmonary embolus, pulmonary hypertension. Mr. [name] is a 55 year old with past medical history of morbid obesity, obstructive sleep apnea, hypertension. She was admitted in January 2017 at [facility] for saddle embolus. She was initially on heparin anticoagulation and transition to Xarelto. Since discharge has been complaining of dyspnea, cough with white sputum production, episodes of dizziness, right chest pain. She was reevaluated with a CT scan which showed persistent emboli and an echocardiogram which showed severe pulmonary hypertension (see below). She is referred to the pulmonary clinic for further evaluation.  She has history of obstructive sleep apnea diagnosed in 2015. She had been maintained on CPAP. She was off therapy for a while but now back on it after replacement of her equipment.  DATA: CT scan 12/25/15 Right lower lobe and left lower lobe pulmonary embolism. Images reviewed  CT scan 02/08/16 Persistent RLL embolus  Echo 12/15/15  LVEF 55-60 percent. Normal cavity, wall thickness, systolic function. RV cavity size normal. Normal wall thickness and systolic function [id number]  Labs 01/10/16 HIV- Neg LFTs- WNL Jo-1, centromere, dsDNA, RNP, Ro, La, RA, Scl-70- All negative  Social History: She is a never smoker, no alcohol, drug use.  Family History: Lung cancer-grandmother.  Past Medical History  Diagnosis Date  . Pulmonary embolism ([place])   . Hypertension   . Obesity   . Arthritis   . Pneumonia   . Diverticulitis     Current outpatient prescriptions:  .  albuterol (PROVENTIL HFA;VENTOLIN HFA) 108 (90 Base) MCG/ACT inhaler, Inhale 1-2 puffs into the lungs every 6 (six) hours as needed for wheezing or shortness of breath., Disp: 6.7 g, Rfl: 5 .  ALPRAZolam (XANAX) 1 MG tablet, Take 1 mg by mouth 2 (two) times daily as needed  for anxiety or sleep., Disp: , Rfl:  .  amLODipine (NORVASC) 5 MG tablet, Take 1 tablet (5 mg total) by mouth daily., Disp: 90 tablet, Rfl: 3 .  atenolol (TENORMIN) 100 MG tablet, Take 1 tablet (100 mg total) by mouth daily. Reported on 01/31/2016, Disp: 90 tablet, Rfl: 2 .  budesonide-formoterol (SYMBICORT) 160-4.5 MCG/ACT inhaler, Inhale 2 puffs into the lungs 2 (two) times daily., Disp: , Rfl:  .  citalopram (CELEXA) 20 MG tablet, Take 20 mg by mouth daily., Disp: , Rfl: 3 .  citalopram (CELEXA) 40 MG tablet, Take 0.5 tablets (20 mg total) by mouth daily., Disp: 90 tablet, Rfl: 2 .  diclofenac sodium (VOLTAREN) 1 % GEL, Apply 2 g topically 4 (four) times daily., Disp: 100 g, Rfl: 2 .  dicyclomine (BENTYL) 20 MG tablet, Take 20 mg by mouth 2 (two) times daily., Disp: , Rfl:  .  fluconazole (DIFLUCAN) 150 MG tablet, Take 1 tablet (150 mg total) by mouth once., Disp: 3 tablet, Rfl: 0 .  Lidocaine-Prilocaine, Bulk, A999333 % CREA, 1 application by Does not apply route 2 (two) times daily as needed. (Patient taking differently: Apply 1 application topically 2 (two) times daily as needed. ), Disp: 1 Bottle, Rfl: 0 .  meclizine (ANTIVERT) 25 MG tablet, Take 1 tablet (25 mg total) by mouth 3 (three) times daily as needed for dizziness., Disp: 30 tablet, Rfl: 0 .  nystatin (MYCOSTATIN/NYSTOP) 100000 UNIT/GM POWD, Apply 5,000 g topically 3 (three) times daily. Vaginal area, Disp: 60 g, Rfl: 0 .  ondansetron (ZOFRAN) 4 MG tablet, Take 1 tablet (  4 mg total) by mouth every 8 (eight) hours as needed for nausea or vomiting., Disp: 20 tablet, Rfl: 0 .  polyethylene glycol (MIRALAX / GLYCOLAX) packet, Take 17 g by mouth daily as needed for mild constipation., Disp: , Rfl:  .  rivaroxaban (XARELTO) 20 MG TABS tablet, Take 1 tablet (20 mg total) by mouth daily with supper., Disp: 90 tablet, Rfl: 3 .  temazepam (RESTORIL) 7.5 MG capsule, Take 1 capsule (7.5 mg total) by mouth at bedtime as needed for sleep., Disp: 30  capsule, Rfl: 0 .  triamterene-hydrochlorothiazide (MAXZIDE-25) 37.5-25 MG tablet, Take 1 tablet by mouth daily. Reported on 01/31/2016, Disp: 90 tablet, Rfl: 3 .  zolpidem (AMBIEN) 5 MG tablet, Take 5 mg by mouth at bedtime as needed for sleep., Disp: , Rfl:  .  oxyCODONE-acetaminophen (PERCOCET/ROXICET) 5-325 MG tablet, Take 1 tablet by mouth every 6 (six) hours as needed for severe pain. (Patient not taking: Reported on 02/15/2016), Disp: 15 tablet, Rfl: 0  Review of Systems Cough, sputum production, dyspnea on exertion, no hemoptysis. Right-sided pleuritic sharp chest pain, no palpitations. No nausea, vomiting, diarrhea, constipation. No fevers, chills, loss of weight, loss of appetite. All other review of systems are negative.    Objective:   Physical Exam Blood pressure 148/84, pulse 72, height 5' 4.5" (1.638 m), weight 328 lb (148.78 kg), SpO2 97 %. Gen: No apparent distress, Morbid obesity Neuro: No gross focal deficits. HEENT: No JVD, lymphadenopathy, thyromegaly. RS: Clear, No wheeze or crackles CVS: S1-S2 heard, no murmurs rubs gallops. Abdomen: Soft, positive bowel sounds. Musculoskeletal: No edema.    Assessment & Plan:  Chronic thromboembolic disease Severe pulmonary hypertension Untreated OSA Morbid obesity    Mr. [name] has a acute PE earlier this year. I don't have much records on hand but apparently it was a saddle embolus. She continues on Xarelto anticoagulation but has significant issues including dyspnea, pleuritic chest pain.  Recent imaging reviewed. 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 Workup for pulmonary hypertension including HIV, LFTs, ANA, RF, ANCA are negative. PFTs.reviewed and these show isolated reduction in diffusion capacity c/w pulmonary vascular process.  Given her morbid obesity, BMI 55 I wonder if the Xarelto anticoagulation is adequate. I'll switch  her over to Coumadin. She will likely need a right heart catheterization and consideration for endarterectomy. I will make a referral to [facility] for this. If she is not a candidate for surgery then she may need a pulmonary vasodialtor such as Adempas.  Plan:  - Switch xarelto to coumadin - Refer to [facility] for consideration of endarterectomy. - Continue CPAP for OSA.   [name] MD [facility] Pager [phone] If no answer or after 3pm call: [phone] 02/15/2016, 4:10 PM

## 2016-02-16 ENCOUNTER — Telehealth: Payer: Self-pay | Admitting: Pulmonary Disease

## 2016-02-16 NOTE — Telephone Encounter (Signed)
Spoke with Butch Penny. States that their coumadin clinic can't monitor coumadin for this pt because she will not be seeing a Cardiologist. Pt will need to be set up with another coumadin clinic. I have sent a staff message to Ovidio Kin to get her guidance on this. Will await her response.

## 2016-02-16 NOTE — Telephone Encounter (Signed)
I spoke with Dr. Christy Sartorius Test regarding the referral to [facility] for further evaluation. They will be able to get her in to see her in a few weeks. He reccommended continuing the Xarelto and to hold off on switching to coumadin.  Please inform the patient about the appointment and instruct her to continue the xarelto until she is seen in [place]. Thanks  [employer]

## 2016-02-19 NOTE — Telephone Encounter (Signed)
[name] [name] responded to my staff message. She is going to work on Catering manager for [place]. Will await her response.

## 2016-02-19 NOTE — Telephone Encounter (Signed)
[name] Garfinkel, MD at 02/16/2016 8:26 AM     Status: Signed       Expand All Collapse All   I spoke with Dr. Christy Sartorius Test regarding the referral to [facility] for further evaluation. They will be able to get her in to see her in a few weeks. He reccommended continuing the Xarelto and to hold off on switching to coumadin.  Please inform the patient about the appointment and instruct her to continue the xarelto until she is seen in [place]. Thanks  [employer] will be closed as pt does not need coumadin at this time.

## 2016-02-20 ENCOUNTER — Ambulatory Visit: Payer: Commercial Managed Care - HMO | Attending: Internal Medicine | Admitting: Internal Medicine

## 2016-02-20 ENCOUNTER — Encounter: Payer: Self-pay | Admitting: Internal Medicine

## 2016-02-20 VITALS — BP 137/90 | HR 55 | Temp 98.1°F | Wt 322.0 lb

## 2016-02-20 DIAGNOSIS — G4733 Obstructive sleep apnea (adult) (pediatric): Secondary | ICD-10-CM

## 2016-02-20 DIAGNOSIS — G894 Chronic pain syndrome: Secondary | ICD-10-CM | POA: Diagnosis not present

## 2016-02-20 DIAGNOSIS — F329 Major depressive disorder, single episode, unspecified: Secondary | ICD-10-CM | POA: Diagnosis not present

## 2016-02-20 DIAGNOSIS — R42 Dizziness and giddiness: Secondary | ICD-10-CM

## 2016-02-20 DIAGNOSIS — I2692 Saddle embolus of pulmonary artery without acute cor pulmonale: Secondary | ICD-10-CM | POA: Diagnosis not present

## 2016-02-20 DIAGNOSIS — F32A Depression, unspecified: Secondary | ICD-10-CM

## 2016-02-20 DIAGNOSIS — Z9989 Dependence on other enabling machines and devices: Secondary | ICD-10-CM

## 2016-02-20 MED ORDER — RIVAROXABAN 20 MG PO TABS: 20.0000 mg | ORAL_TABLET | Freq: Every day | ORAL | Status: DC

## 2016-02-20 MED ORDER — HYDROCHLOROTHIAZIDE 12.5 MG PO CAPS: 12.5000 mg | ORAL_CAPSULE | Freq: Every day | ORAL | Status: DC | PRN

## 2016-02-20 MED ORDER — CITALOPRAM HYDROBROMIDE 20 MG PO TABS: 20.0000 mg | ORAL_TABLET | Freq: Every day | ORAL | Status: DC

## 2016-02-20 MED ORDER — MECLIZINE HCL 25 MG PO TABS: 25.0000 mg | ORAL_TABLET | Freq: Three times a day (TID) | ORAL | Status: DC | PRN

## 2016-02-20 MED FILL — TRAVEL SICKNESS 25 MG TAB C: 25 | 30 days supply | Qty: 30 | Fill #0

## 2016-02-20 MED FILL — CITALOPRAM HBR 20 MG TABLET: 20 | 30 days supply | Qty: 90 | Fill #0

## 2016-02-20 MED FILL — HYDROCHLOROTHIAZIDE 12.5 MG: 12.5 | 30 days supply | Qty: 30 | Fill #0

## 2016-02-20 NOTE — Telephone Encounter (Signed)
Patient was seen on 01/31/16 and received refills on all medications at [facility]

## 2016-02-20 NOTE — Progress Notes (Signed)
[name] [name], is a 55 y.o. female  [id number]  [id number]  DOB - March 13, 1961  Chief Complaint  Patient presents with  . Follow-up    Anxiety;Hypertension        Subjective:   [name] [name] is a 55 y.o. female here today for a follow up visit., for f/u anxiety and pe.  She still gets bouts of intermittent chest pains, but anxiety levels have improved slightly w/ celexa.  She recently was seen by Pulm, Dr Vaughan Browner, who is recd evaluation at [facility] w/ Dr Blase Mess for [facility].  Per notes, she is advised to continue Xarelto until f/u w/ [facility], and I expressed this to her accordingly today.   Patient has No headache, No chest pain, No abdominal pain - No Nausea, No new weakness tingling or numbness, No Cough. DOE about same.  C/o of some lower extremity leg swelling intermittantly.  Pt requested a parking decal today.  She is here w/ her husband and 13yo grandson.  They had some more questions about pulmonary enderectomy   No problems updated.  ALLERGIES: No Known Allergies  PAST MEDICAL HISTORY: Past Medical History  Diagnosis Date  . Pulmonary embolism ([place])   . Hypertension   . Obesity   . Arthritis   . Pneumonia   . Diverticulitis     MEDICATIONS AT HOME: Prior to Admission medications   Medication Sig Start Date End Date Taking? Authorizing Provider  albuterol (PROVENTIL HFA;VENTOLIN HFA) 108 (90 Base) MCG/ACT inhaler Inhale 1-2 puffs into the lungs every 6 (six) hours as needed for wheezing or shortness of breath. 02/08/16  Yes Maryan Puls, MD  ALPRAZolam Duanne Moron) 1 MG tablet Take 1 mg by mouth 2 (two) times daily as needed for anxiety or sleep.   Yes Historical Provider, MD  amLODipine (NORVASC) 5 MG tablet Take 1 tablet (5 mg total) by mouth daily. 12/12/15  Yes Maren Reamer, MD  atenolol (TENORMIN) 100 MG tablet Take 1 tablet (100 mg total) by mouth daily. Reported on 01/31/2016 01/31/16  Yes Maren Reamer, MD  budesonide-formoterol [facility]) 160-4.5  MCG/ACT inhaler Inhale 2 puffs into the lungs 2 (two) times daily.   Yes Historical Provider, MD  diclofenac sodium (VOLTAREN) 1 % GEL Apply 2 g topically 4 (four) times daily. 01/31/16  Yes Quinterrius Errington Lazarus Gowda, MD  Lidocaine-Prilocaine, Bulk, A999333 % CREA 1 application by Does not apply route 2 (two) times daily as needed. Patient taking differently: Apply 1 application topically 2 (two) times daily as needed.  02/06/16  Yes Maren Reamer, MD  meclizine (ANTIVERT) 25 MG tablet Take 1 tablet (25 mg total) by mouth 3 (three) times daily as needed for dizziness. 02/20/16  Yes Maren Reamer, MD  nystatin (MYCOSTATIN/NYSTOP) 100000 UNIT/GM POWD Apply 5,000 g topically 3 (three) times daily. Vaginal area 11/20/15  Yes Maren Reamer, MD  ondansetron (ZOFRAN) 4 MG tablet Take 1 tablet (4 mg total) by mouth every 8 (eight) hours as needed for nausea or vomiting. 12/19/15  Yes Maren Reamer, MD  polyethylene glycol (MIRALAX / GLYCOLAX) packet Take 17 g by mouth daily as needed for mild constipation.   Yes Historical Provider, MD  zolpidem (AMBIEN) 5 MG tablet Take 5 mg by mouth at bedtime as needed for sleep.   Yes Historical Provider, MD  citalopram (CELEXA) 20 MG tablet Take 1 tablet (20 mg total) by mouth daily. Reported on 02/20/2016 02/20/16   Maren Reamer, MD  dicyclomine (BENTYL) 20 MG tablet  Take 20 mg by mouth 2 (two) times daily. Reported on 02/20/2016    Historical Provider, MD  fluconazole (DIFLUCAN) 150 MG tablet Take 1 tablet (150 mg total) by mouth once. Patient not taking: Reported on 02/20/2016 11/20/15   Maren Reamer, MD  hydrochlorothiazide (MICROZIDE) 12.5 MG capsule Take 1 capsule (12.5 mg total) by mouth daily as needed. 02/20/16   Maren Reamer, MD  oxyCODONE-acetaminophen (PERCOCET/ROXICET) 5-325 MG tablet Take 1 tablet by mouth every 6 (six) hours as needed for severe pain. Patient not taking: Reported on 02/15/2016 02/08/16   Maryan Puls, MD  rivaroxaban (XARELTO) 20 MG TABS  tablet Take 1 tablet (20 mg total) by mouth daily with supper. 02/20/16   Maren Reamer, MD  temazepam (RESTORIL) 7.5 MG capsule Take 1 capsule (7.5 mg total) by mouth at bedtime as needed for sleep. Patient not taking: Reported on 02/20/2016 12/12/15   Maren Reamer, MD     Objective:   Filed Vitals:   02/20/16 1645  BP: 137/90  Pulse: 55  Temp: 98.1 F (36.7 C)  TempSrc: Oral  Weight: 322 lb (146.058 kg)    Exam General appearance : Awake, alert, not in any distress. Speech Clear. Not toxic looking, morbid obese, but less anxious and dyspneic  today HEENT: Atraumatic and Normocephalic Neck: supple, no JVD. No cervical lymphadenopathy.  Chest: clear diffusely,, no added sounds. CVS: S1 S2 regular, no murmurs/gallups or rubs. Abdomen: Bowel sounds active, obese, mild ttp ruq/low back region (unchanged to prior) Extremities: B/L Lower Ext shows no edema, both legs are warm to touch, Neurology: Awake alert, and oriented X 3, CN II-XII grossly intact, Non focal Skin:No Rash  Data Review No results found for: HGBA1C  Depression screen [facility] 2/9 02/20/2016 01/31/2016 11/20/2015 11/15/2015 11/15/2015  Decreased Interest 2 2 0 0 0  Down, Depressed, Hopeless 1 2 0 2 0  PHQ - 2 Score 3 4 0 2 0  Altered sleeping 3 3 - 3 -  Tired, decreased energy 2 2 - 3 -  Change in appetite 2 2 - 2 -  Feeling bad or failure about yourself  1 1 - 3 -  Trouble concentrating 1 1 - 3 -  Moving slowly or fidgety/restless 0 0 - 1 -  Suicidal thoughts 0 0 - 0 -  PHQ-9 Score 12 13 - 17 -  Difficult doing work/chores Somewhat difficult Somewhat difficult - - -      Assessment & Plan    1. 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  - We actually can do Coumadin in our Coumadin clinic here if she ever needs to be put on that. - followed by [facility], Dr Vaughan Browner - set to see Dr  Delia Chimes at [facility] for eval for possible enderectomy, - I went over enderectomy procedure and various recd w/ pt and husband today. - ?stress test /lhc? Prior to procedure, will d/w Pulm.   2. Vertigo Intermittant, antivert helps her sometimes, renewed - meclizine (ANTIVERT) 25 MG tablet; Take 1 tablet (25 mg total) by mouth 3 (three) times daily as needed for dizziness.  Dispense: 30 tablet; Refill: 3   3. Depression Continue celexa 20 qday., no si/hi/avh today  4. Chronic pain syndrome, bilateral knees esp. - prn Voltarin gel, which is helping.  5. osa on cpap  6. Other Pt given form for temp  handicap parking decal., signed.    Patient have been counseled extensively about nutrition and exercise  Return in about 8 weeks (around 04/16/2016), or if symptoms worsen or fail to improve, for pe/htn.  The patient was given clear instructions to go to ER or return to medical center if symptoms don't improve, worsen or new problems develop. The patient verbalized understanding. The patient was told to call to get lab results if they haven't heard anything in the next week.    Maren Reamer, MD, [place] [facility] [place], [place]   02/20/2016, 5:56 PM

## 2016-02-21 MED FILL — XARELTO 20 MG TABLET: 20 | 30 days supply | Qty: 30 | Fill #0

## 2016-03-04 MED FILL — AMLODIPINE BESYLATE 5 MG TA: 5 | 30 days supply | Qty: 30 | Fill #2

## 2016-03-05 ENCOUNTER — Telehealth: Payer: Self-pay | Admitting: Pulmonary Disease

## 2016-03-05 ENCOUNTER — Telehealth: Payer: Self-pay | Admitting: Internal Medicine

## 2016-03-05 NOTE — Telephone Encounter (Signed)
Called to talk pt, confirmed dob.  Pt took bath, felll on her left side, hit head against the head board.  +pain, had hard time to get up., dizzy, no LOC., pt got herself up by self.  Husband just left the house., came home and found her sitting in chair.    She is very anxious, in a lot of pain.  I put her on my schedule at [facility] tomorw for eval.

## 2016-03-05 NOTE — Telephone Encounter (Signed)
Spoke with the pt  She states that she had a fall yesterday at home while bending over to pick up something She states "I think I blacked out"  She hit her head, but did not break the skin  She states she has some headache today, but overall doing okay She is not having any confusion She is using "numbing cream" and this has helped some  I advised she should call her PCP, but will forward to PM so that he is aware Pt also notified to seek emergent care should her HA persist or worsen

## 2016-03-05 NOTE — Telephone Encounter (Signed)
Pt. Called requesting to speak with the nurse.  Pt. Did not give any details and just requested to speak with the nurse.  Please f/u

## 2016-03-06 ENCOUNTER — Other Ambulatory Visit: Payer: Self-pay | Admitting: Internal Medicine

## 2016-03-06 ENCOUNTER — Encounter: Payer: Self-pay | Admitting: Internal Medicine

## 2016-03-06 ENCOUNTER — Ambulatory Visit: Payer: Commercial Managed Care - HMO | Attending: Internal Medicine | Admitting: Internal Medicine

## 2016-03-06 VITALS — BP 124/81 | HR 74 | Temp 98.2°F | Resp 16 | Ht 65.0 in | Wt 319.8 lb

## 2016-03-06 DIAGNOSIS — R42 Dizziness and giddiness: Secondary | ICD-10-CM | POA: Diagnosis not present

## 2016-03-06 DIAGNOSIS — Z9181 History of falling: Secondary | ICD-10-CM | POA: Diagnosis not present

## 2016-03-06 DIAGNOSIS — I2692 Saddle embolus of pulmonary artery without acute cor pulmonale: Secondary | ICD-10-CM

## 2016-03-06 DIAGNOSIS — B3789 Other sites of candidiasis: Secondary | ICD-10-CM | POA: Diagnosis not present

## 2016-03-06 MED ORDER — CLOTRIMAZOLE 1 % EX CREA: 1.0000 "application " | TOPICAL_CREAM | Freq: Two times a day (BID) | CUTANEOUS | Status: DC

## 2016-03-06 MED ORDER — FLUCONAZOLE 150 MG PO TABS: 150.0000 mg | ORAL_TABLET | Freq: Every day | ORAL | Status: DC

## 2016-03-06 MED ORDER — MELATONIN 3 MG PO TABS: 3.0000 mg | ORAL_TABLET | Freq: Every day | ORAL | Status: DC

## 2016-03-06 MED FILL — CLOTRIMAZOLE 1% CREAM: 1 | 20 days supply | Qty: 28 | Fill #0

## 2016-03-06 MED FILL — FLUCONAZOLE 150 MG TABLET: 150 | 3 days supply | Qty: 3 | Fill #0

## 2016-03-06 NOTE — Telephone Encounter (Signed)
Pt clld in before going into MyChart and reading your message. Pt is inquiring whether or not she needs to still get Rxs you sent to the pharmacy today. Pls advise or contact pt. Thanks.

## 2016-03-06 NOTE — Patient Instructions (Addendum)
Melatonin - sleep aid  Insomnia Insomnia is a sleep disorder that makes it difficult to fall asleep or to stay asleep. Insomnia can cause tiredness (fatigue), low energy, difficulty concentrating, mood swings, and poor performance at work or school.  There are three different ways to classify insomnia:  Difficulty falling asleep.  Difficulty staying asleep.  Waking up too early in the morning. Any type of insomnia can be long-term (chronic) or short-term (acute). Both are common. Short-term insomnia usually lasts for three months or less. Chronic insomnia occurs at least three times a week for longer than three months. CAUSES  Insomnia may be caused by another condition, situation, or substance, such as:  Anxiety.  Certain medicines.  Gastroesophageal reflux disease (GERD) or other gastrointestinal conditions.  Asthma or other breathing conditions.  Restless legs syndrome, sleep apnea, or other sleep disorders.  Chronic pain.  Menopause. This may include hot flashes.  Stroke.  Abuse of alcohol, tobacco, or illegal drugs.  Depression.  Caffeine.   Neurological disorders, such as Alzheimer disease.  An overactive thyroid (hyperthyroidism). The cause of insomnia may not be known. RISK FACTORS Risk factors for insomnia include:  Gender. Women are more commonly affected than men.  Age. Insomnia is more common as you get older.  Stress. This may involve your professional or personal life.  Income. Insomnia is more common in people with lower income.  Lack of exercise.   Irregular work schedule or night shifts.  Traveling between different time zones. SIGNS AND SYMPTOMS If you have insomnia, trouble falling asleep or trouble staying asleep is the main symptom. This may lead to other symptoms, such as:  Feeling fatigued.  Feeling nervous about going to sleep.  Not feeling rested in the morning.  Having trouble concentrating.  Feeling irritable, anxious,  or depressed. TREATMENT  Treatment for insomnia depends on the cause. If your insomnia is caused by an underlying condition, treatment will focus on addressing the condition. Treatment may also include:   Medicines to help you sleep.  Counseling or therapy.  Lifestyle adjustments. HOME CARE INSTRUCTIONS   Take medicines only as directed by your health care provider.  Keep regular sleeping and waking hours. Avoid naps.  Keep a sleep diary to help you and your health care provider figure out what could be causing your insomnia. Include:   When you sleep.  When you wake up during the night.  How well you sleep.   How rested you feel the next day.  Any side effects of medicines you are taking.  What you eat and drink.   Make your bedroom a comfortable place where it is easy to fall asleep:  Put up shades or special blackout curtains to block light from outside.  Use a white noise machine to block noise.  Keep the temperature cool.   Exercise regularly as directed by your health care provider. Avoid exercising right before bedtime.  Use relaxation techniques to manage stress. Ask your health care provider to suggest some techniques that may work well for you. These may include:  Breathing exercises.  Routines to release muscle tension.  Visualizing peaceful scenes.  Cut back on alcohol, caffeinated beverages, and cigarettes, especially close to bedtime. These can disrupt your sleep.  Do not overeat or eat spicy foods right before bedtime. This can lead to digestive discomfort that can make it hard for you to sleep.  Limit screen use before bedtime. This includes:  Watching TV.  Using your smartphone, tablet, and computer.  Stick to a routine. This can help you fall asleep faster. Try to do a quiet activity, brush your teeth, and go to bed at the same time each night.  Get out of bed if you are still awake after 15 minutes of trying to sleep. Keep the lights  down, but try reading or doing a quiet activity. When you feel sleepy, go back to bed.  Make sure that you drive carefully. Avoid driving if you feel very sleepy.  Keep all follow-up appointments as directed by your health care provider. This is important. SEEK MEDICAL CARE IF:   You are tired throughout the day or have trouble in your daily routine due to sleepiness.  You continue to have sleep problems or your sleep problems get worse. SEEK IMMEDIATE MEDICAL CARE IF:   You have serious thoughts about hurting yourself or someone else.   This information is not intended to replace advice given to you by your health care provider. Make sure you discuss any questions you have with your health care provider.   Document Released: 08/23/2000 Document Revised: 05/17/2015 Document Reviewed: 05/27/2014 Elsevier Interactive Patient Education 2016 Else[employer] Inc[employer]    Dizziness Dizziness is a common problem. It makes you feel unsteady or lightheaded. You may feel like you are about to pass out (faint). Dizziness can lead to injury if you stumble or fall. Anyone can get dizzy, but dizziness is more common in older adults. This condition can be caused by a number of things, including:  Medicines.  Dehydration.  Illness. HOME CARE Following these instructions may help with your condition: Eating and Drinking  Drink enough fluid to keep your pee (urine) clear or pale yellow. This helps to keep you from getting dehydrated. Try to drink more clear fluids, such as water.  Do not drink alcohol.  Limit how much caffeine you drink or eat if told by your doctor.  Limit how much salt you drink or eat if told by your doctor. Activity  Avoid making quick movements.  When you stand up from sitting in a chair, steady yourself until you feel okay.  In the morning, first sit up on the side of the bed. When you feel okay, stand slowly while you hold onto something. Do this until you know that your  balance is fine.  Move your legs often if you need to stand in one place for a long time. Tighten and relax your muscles in your legs while you are standing.  Do not drive or use heavy machinery if you feel dizzy.  Avoid bending down if you feel dizzy. Place items in your home so that they are easy for you to reach without leaning over. Lifestyle  Do not use any tobacco products, including cigarettes, chewing tobacco, or electronic cigarettes. If you need help quitting, ask your doctor.  Try to lower your stress level, such as with yoga or meditation. Talk with your doctor if you need help. General Instructions  Watch your dizziness for any changes.  Take medicines only as told by your doctor. Talk with your doctor if you think that your dizziness is caused by a medicine that you are taking.  Tell a friend or a family member that you are feeling dizzy. If he or she notices any changes in your behavior, have this person call your doctor.  Keep all follow-up visits as told by your doctor. This is important. GET HELP IF:  Your dizziness does not go away.  Your dizziness or  light-headedness gets worse.  You feel sick to your stomach (nauseous).  You have trouble hearing.  You have new symptoms.  You are unsteady on your feet or you feel like the room is spinning. GET HELP RIGHT AWAY IF:  You throw up (vomit) or have diarrhea and are unable to eat or drink anything.  You have trouble:  Talking.  Walking.  Swallowing.  Using your arms, hands, or legs.  You feel generally weak.  You are not thinking clearly or you have trouble forming sentences. It may take a friend or family member to notice this.  You have:  Chest pain.  Pain in your belly (abdomen).  Shortness of breath.  Sweating.  Your vision changes.  You are bleeding.  You have a headache.  You have neck pain or a stiff neck.  You have a fever.   This information is not intended to replace  advice given to you by your health care provider. Make sure you discuss any questions you have with your health care provider.   - Benign Positional Vertigo Vertigo is the feeling that you or your surroundings are moving when they are not. Benign positional vertigo is the most common form of vertigo. The cause of this condition is not serious (is benign). This condition is triggered by certain movements and positions (is positional). This condition can be dangerous if it occurs while you are doing something that could endanger you or others, such as driving.  CAUSES In many cases, the cause of this condition is not known. It may be caused by a disturbance in an area of the inner ear that helps your brain to sense movement and balance. This disturbance can be caused by a viral infection (labyrinthitis), head injury, or repetitive motion. RISK FACTORS This condition is more likely to develop in:  Women.  People who are 41 years of age or older. SYMPTOMS Symptoms of this condition usually happen when you move your head or your eyes in different directions. Symptoms may start suddenly, and they usually last for less than a minute. Symptoms may include:  Loss of balance and falling.  Feeling like you are spinning or moving.  Feeling like your surroundings are spinning or moving.  Nausea and vomiting.  Blurred vision.  Dizziness.  Involuntary eye movement (nystagmus). Symptoms can be mild and cause only slight annoyance, or they can be severe and interfere with daily life. Episodes of benign positional vertigo may return (recur) over time, and they may be triggered by certain movements. Symptoms may improve over time. DIAGNOSIS This condition is usually diagnosed by medical history and a physical exam of the head, neck, and ears. You may be referred to a health care provider who specializes in ear, nose, and throat (ENT) problems (otolaryngologist) or a provider who specializes in disorders  of the nervous system (neurologist). You may have additional testing, including:  MRI.  A CT scan.  Eye movement tests. Your health care provider may ask you to change positions quickly while he or she watches you for symptoms of benign positional vertigo, such as nystagmus. Eye movement may be tested with an electronystagmogram (ENG), caloric stimulation, the Dix-Hallpike test, or the roll test.  An electroencephalogram (EEG). This records electrical activity in your brain.  Hearing tests. TREATMENT Usually, your health care provider will treat this by moving your head in specific positions to adjust your inner ear back to normal. Surgery may be needed in severe cases, but this is rare. In  some cases, benign positional vertigo may resolve on its own in 2-4 weeks. HOME CARE INSTRUCTIONS Safety  Move slowly.Avoid sudden body or head movements.  Avoid driving.  Avoid operating heavy machinery.  Avoid doing any tasks that would be dangerous to you or others if a vertigo episode would occur.  If you have trouble walking or keeping your balance, try using a cane for stability. If you feel dizzy or unstable, sit down right away.  Return to your normal activities as told by your health care provider. Ask your health care provider what activities are safe for you. General Instructions  Take over-the-counter and prescription medicines only as told by your health care provider.  Avoid certain positions or movements as told by your health care provider.  Drink enough fluid to keep your urine clear or pale yellow.  Keep all follow-up visits as told by your health care provider. This is important. SEEK MEDICAL CARE IF:  You have a fever.  Your condition gets worse or you develop new symptoms.  Your family or friends notice any behavioral changes.  Your nausea or vomiting gets worse.  You have numbness or a "pins and needles" sensation. SEEK IMMEDIATE MEDICAL CARE IF:  You have  difficulty speaking or moving.  You are always dizzy.  You faint.  You develop severe headaches.  You have weakness in your legs or arms.  You have changes in your hearing or vision.  You develop a stiff neck.  You develop sensitivity to light.   This information is not intended to replace advice given to you by your health care provider. Make sure you discuss any questions you have with your health care provider.   Document Released: 06/03/2006 Document Revised: 05/17/2015 Document Reviewed: 12/19/2014 Elsevier Interactive Patient Education 2016 [employer] Released: 08/15/2011 Document Revised: 01/10/2015 Document Reviewed: 08/22/2014 Elsevier Interactive Patient Education [employer].

## 2016-03-06 NOTE — Progress Notes (Signed)
[name] [name], is a 54 y.o. female  [id number]  [id number]  DOB - 12-01-1960  Chief Complaint  Patient presents with  . Fall        Subjective:   [name] [name] is a 55 y.o. female here today for a follow up visit, sp fall on Monday when trying to take bath.  [address] and hit her back/head on floorboard/floor. Pt thought she saw "spots" but started counting, denied los.  Sore all over, but no bruising as far as she knows except small bruise on her right wrist.  She had some leftover norco 5/325 (dispensed from ED last month), and took 1/2 tab Monday to help w/ pain. Since than, she has been taking 1-2 tabs of tylenol which helps.  Since fall, feels like her bpv is worse, room spinning at times, dizzy when gets up fast.  She has had to take her meclizine more often, which helps. Denies ha/visual problems currently.  Walking w/ cane.  C/o of rash on her inner thigh, left, she noticed recently. Denies any exposure to animals. Denies pain, pruritis.   Patient has No headache, No chest pain, No abdominal pain - No Nausea, No new weakness tingling or numbness, No Cough - SOB.  No problems updated.  ALLERGIES: No Known Allergies  PAST MEDICAL HISTORY: Past Medical History  Diagnosis Date  . Pulmonary embolism ([place])   . Hypertension   . Obesity   . Arthritis   . Pneumonia   . Diverticulitis     MEDICATIONS AT HOME: Prior to Admission medications   Medication Sig Start Date End Date Taking? Authorizing Provider  albuterol (PROVENTIL HFA;VENTOLIN HFA) 108 (90 Base) MCG/ACT inhaler Inhale 1-2 puffs into the lungs every 6 (six) hours as needed for wheezing or shortness of breath. 02/08/16  Yes Maryan Puls, MD  ALPRAZolam Duanne Moron) 1 MG tablet Take 1 mg by mouth 2 (two) times daily as needed for anxiety or sleep.   Yes Historical Provider, MD  amLODipine (NORVASC) 5 MG tablet Take 1 tablet (5 mg total) by mouth daily. 12/12/15  Yes Maren Reamer, MD  atenolol (TENORMIN) 100 MG  tablet Take 1 tablet (100 mg total) by mouth daily. Reported on 01/31/2016 01/31/16  Yes Maren Reamer, MD  budesonide-formoterol [facility]) 160-4.5 MCG/ACT inhaler Inhale 2 puffs into the lungs 2 (two) times daily.   Yes Historical Provider, MD  citalopram (CELEXA) 20 MG tablet Take 1 tablet (20 mg total) by mouth daily. Reported on 02/20/2016 02/20/16  Yes Dawn Lazarus Gowda, MD  hydrochlorothiazide (MICROZIDE) 12.5 MG capsule Take 1 capsule (12.5 mg total) by mouth daily as needed. 02/20/16  Yes Dawn Lazarus Gowda, MD  Lidocaine-Prilocaine, Bulk, A999333 % CREA 1 application by Does not apply route 2 (two) times daily as needed. Patient taking differently: Apply 1 application topically 2 (two) times daily as needed.  02/06/16  Yes Maren Reamer, MD  meclizine (ANTIVERT) 25 MG tablet Take 1 tablet (25 mg total) by mouth 3 (three) times daily as needed for dizziness. 02/20/16  Yes Maren Reamer, MD  nystatin (MYCOSTATIN/NYSTOP) 100000 UNIT/GM POWD Apply 5,000 g topically 3 (three) times daily. Vaginal area 11/20/15  Yes Maren Reamer, MD  oxyCODONE-acetaminophen (PERCOCET/ROXICET) 5-325 MG tablet Take 1 tablet by mouth every 6 (six) hours as needed for severe pain. 02/08/16  Yes Maryan Puls, MD  polyethylene glycol (MIRALAX / GLYCOLAX) packet Take 17 g by mouth daily as needed for mild constipation.   Yes Historical Provider, MD  rivaroxaban (XARELTO) 20 MG TABS tablet Take 1 tablet (20 mg total) by mouth daily with supper. 02/20/16  Yes Maren Reamer, MD  clotrimazole (LOTRIMIN) 1 % cream Apply 1 application topically 2 (two) times daily. Apply to left inner thigh 03/06/16   Maren Reamer, MD  diclofenac sodium (VOLTAREN) 1 % GEL Apply 2 g topically 4 (four) times daily. Patient not taking: Reported on 03/06/2016 01/31/16   Maren Reamer, MD  dicyclomine (BENTYL) 20 MG tablet Take 20 mg by mouth 2 (two) times daily. Reported on 03/06/2016    Historical Provider, MD  fluconazole (DIFLUCAN) 150  MG tablet Take 1 tablet (150 mg total) by mouth once. Patient not taking: Reported on 02/20/2016 11/20/15   Maren Reamer, MD  fluconazole (DIFLUCAN) 150 MG tablet Take 1 tablet (150 mg total) by mouth daily. 03/06/16   Maren Reamer, MD  Melatonin 3 MG TABS Take 1 tablet (3 mg total) by mouth at bedtime. 03/06/16   Maren Reamer, MD  ondansetron (ZOFRAN) 4 MG tablet Take 1 tablet (4 mg total) by mouth every 8 (eight) hours as needed for nausea or vomiting. Patient not taking: Reported on 03/06/2016 12/19/15   Maren Reamer, MD  temazepam (RESTORIL) 7.5 MG capsule Take 1 capsule (7.5 mg total) by mouth at bedtime as needed for sleep. Patient not taking: Reported on 02/20/2016 12/12/15   Maren Reamer, MD  zolpidem (AMBIEN) 5 MG tablet Take 5 mg by mouth at bedtime as needed for sleep. Reported on 03/06/2016    Historical Provider, MD     Objective:   Filed Vitals:   03/06/16 1214  BP: 124/81  Pulse: 74  Temp: 98.2 F (36.8 C)  TempSrc: Oral  Resp: 16  Height: 5\' 5"  (1.651 m)  Weight: 319 lb 12.8 oz (145.06 kg)  SpO2: 100%   Negative orthostatics today, but dizzy w/ room spinning during exam. bmi 53.22  Exam General appearance : Awake, alert, not in any distress. Speech Clear. Not toxic looking, morbid obese HEENT: Atraumatic and Normocephalic, pupils equally reactive to light.  eomi intact. Neck: supple, no JVD. No cervical lymphadenopathy.  Chest:Good air entry bilaterally, no added sounds. CVS: S1 S2 regular, no murmurs/gallups or rubs. Abdomen: Bowel sounds active, obese, nttp   Extremities: B/L Lower Ext shows no edema, both legs are warm to touch + left inner thigh, w/ 1-2 circumferential lesions, w/ scaling noted. No pustule lesions/blebs noted.  Nttp., dermatome l2 distribution.  Neurology: Awake alert, and oriented X 3, CN II-XII intact, Non focal Skin:No Rash  Data Review No results found for: HGBA1C  Depression screen [facility] 2/9 03/06/2016 02/20/2016 01/31/2016  11/20/2015 11/15/2015  Decreased Interest 3 2 2  0 0  Down, Depressed, Hopeless 3 1 2  0 2  PHQ - 2 Score 6 3 4  0 2  Altered sleeping 3 3 3  - 3  Tired, decreased energy 3 2 2  - 3  Change in appetite 2 2 2  - 2  Feeling bad or failure about yourself  2 1 1  - 3  Trouble concentrating 3 1 1  - 3  Moving slowly or fidgety/restless 3 0 0 - 1  Suicidal thoughts 0 0 0 - 0  PHQ-9 Score 22 12 13  - 17  Difficult doing work/chores - Somewhat difficult Somewhat difficult - -      Assessment & Plan   1. Vertigo/bpv - likely worse w/ recent fall and chronic problems (morbid obesity, saddle emboli) - meclizine prn,  which is helping - no worrisome neurologic findings on exam  2. 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  3. History of recent fall - Fortunately no residual trauma, prn tylenol. She has a few more norco 5/325 from Ed last month, but only taking 1/ 2 tab very sparingly.  4. Candida rash of groin Suspected, probably ring warm, pt denies pain/pruritis, doubt shingles given presentation, but could be atypical presentation.  - trial diflucan 150 x 3 days (given morbid obesity) and clotrimazole cream - sent her a mychart text for further details about rash.    Patient have been counseled extensively about nutrition and exercise  Return in about 4 weeks (around 04/03/2016).  The patient was given clear instructions to go to ER or return to medical center if symptoms don't improve, worsen or new problems develop. The patient verbalized understanding. The patient was told to call to get lab results if they haven't heard anything in the next week.   This note has been created with Surveyor, quantity. Any transcriptional errors are unintentional.   Maren Reamer, MD, [facility], [place]   03/06/2016, 1:01  PM

## 2016-03-13 ENCOUNTER — Telehealth: Payer: Self-pay | Admitting: Internal Medicine

## 2016-03-13 MED ORDER — ACYCLOVIR 800 MG PO TABS: 800.0000 mg | ORAL_TABLET | Freq: Every day | ORAL | Status: DC

## 2016-03-13 MED FILL — ACYCLOVIR 800 MG TABLET: 800 | 7 days supply | Qty: 35 | Fill #0

## 2016-03-13 NOTE — Telephone Encounter (Signed)
Attempted both phn lines, no answer, unable to leave message. Will mychart email her.

## 2016-03-13 NOTE — Telephone Encounter (Signed)
Patient returned nurse phone call.

## 2016-03-14 ENCOUNTER — Telehealth: Payer: Self-pay | Admitting: Internal Medicine

## 2016-03-14 DIAGNOSIS — D649 Anemia, unspecified: Secondary | ICD-10-CM | POA: Diagnosis not present

## 2016-03-14 DIAGNOSIS — M069 Rheumatoid arthritis, unspecified: Secondary | ICD-10-CM | POA: Diagnosis not present

## 2016-03-14 DIAGNOSIS — R918 Other nonspecific abnormal finding of lung field: Secondary | ICD-10-CM | POA: Diagnosis not present

## 2016-03-14 DIAGNOSIS — I2602 Saddle embolus of pulmonary artery with acute cor pulmonale: Secondary | ICD-10-CM

## 2016-03-14 DIAGNOSIS — I2782 Chronic pulmonary embolism: Secondary | ICD-10-CM | POA: Diagnosis not present

## 2016-03-14 DIAGNOSIS — I2724 Chronic thromboembolic pulmonary hypertension: Secondary | ICD-10-CM | POA: Insufficient documentation

## 2016-03-14 DIAGNOSIS — I272 Other secondary pulmonary hypertension: Secondary | ICD-10-CM | POA: Diagnosis not present

## 2016-03-14 DIAGNOSIS — R0609 Other forms of dyspnea: Secondary | ICD-10-CM | POA: Diagnosis not present

## 2016-03-14 DIAGNOSIS — I270 Primary pulmonary hypertension: Secondary | ICD-10-CM | POA: Diagnosis not present

## 2016-03-14 DIAGNOSIS — G894 Chronic pain syndrome: Secondary | ICD-10-CM | POA: Diagnosis not present

## 2016-03-14 DIAGNOSIS — I071 Rheumatic tricuspid insufficiency: Secondary | ICD-10-CM | POA: Diagnosis not present

## 2016-03-14 DIAGNOSIS — Z79899 Other long term (current) drug therapy: Secondary | ICD-10-CM | POA: Diagnosis not present

## 2016-03-14 DIAGNOSIS — I517 Cardiomegaly: Secondary | ICD-10-CM | POA: Diagnosis not present

## 2016-03-14 DIAGNOSIS — Z452 Encounter for adjustment and management of vascular access device: Secondary | ICD-10-CM | POA: Diagnosis not present

## 2016-03-14 DIAGNOSIS — R0602 Shortness of breath: Secondary | ICD-10-CM | POA: Diagnosis not present

## 2016-03-14 DIAGNOSIS — G4733 Obstructive sleep apnea (adult) (pediatric): Secondary | ICD-10-CM | POA: Diagnosis not present

## 2016-03-14 NOTE — Telephone Encounter (Signed)
Ophelia Charter from [facility] called in regards to a referral for this pt to Dr. Christy Sartorius Test He would like a call back Thank you

## 2016-03-18 ENCOUNTER — Telehealth: Payer: Self-pay | Admitting: Internal Medicine

## 2016-03-18 NOTE — Telephone Encounter (Signed)
Pt. Called requesting to speak with her PCP about the visit she will be having with [facility].  Pt. Really needs to speak with her PCP. She stated she has my chart and her doctor can  E-mail her. Please f/u with pt.

## 2016-03-18 NOTE — Telephone Encounter (Signed)
Talked to [place]/ placed referral

## 2016-03-19 NOTE — Telephone Encounter (Signed)
Called pt and talked to her today via phn.  Doing well overall.  Rash was there weeks before she saw me for it.   She is currently on acylclovir and took the diflucan, and using the antifungal cream.  I told her I don't know how much help the acyclovir will do since so late after treatment, but hopefully will help some.  Pt talked to me abt [facility] /Dr Test and the studies they have done so far.  She has further testing at [facility] on 7/17, may get temp filter.  She seems to be in good spirits and will keep me posted.

## 2016-03-20 ENCOUNTER — Telehealth: Payer: Self-pay | Admitting: Internal Medicine

## 2016-03-20 ENCOUNTER — Encounter (HOSPITAL_COMMUNITY): Payer: Self-pay | Admitting: Emergency Medicine

## 2016-03-20 ENCOUNTER — Emergency Department (HOSPITAL_COMMUNITY)
Admission: EM | Admit: 2016-03-20 | Discharge: 2016-03-20 | Disposition: A | Discharge status: Other Acute Inpatient Hospital | Payer: Commercial Managed Care - HMO | Attending: Emergency Medicine | Admitting: Emergency Medicine

## 2016-03-20 ENCOUNTER — Emergency Department (HOSPITAL_COMMUNITY): Payer: Commercial Managed Care - HMO

## 2016-03-20 DIAGNOSIS — I10 Essential (primary) hypertension: Secondary | ICD-10-CM | POA: Diagnosis not present

## 2016-03-20 DIAGNOSIS — Z7901 Long term (current) use of anticoagulants: Secondary | ICD-10-CM | POA: Insufficient documentation

## 2016-03-20 DIAGNOSIS — Z79899 Other long term (current) drug therapy: Secondary | ICD-10-CM | POA: Insufficient documentation

## 2016-03-20 DIAGNOSIS — R42 Dizziness and giddiness: Secondary | ICD-10-CM | POA: Diagnosis not present

## 2016-03-20 DIAGNOSIS — R079 Chest pain, unspecified: Secondary | ICD-10-CM | POA: Insufficient documentation

## 2016-03-20 DIAGNOSIS — R0789 Other chest pain: Secondary | ICD-10-CM | POA: Diagnosis not present

## 2016-03-20 DIAGNOSIS — R0600 Dyspnea, unspecified: Secondary | ICD-10-CM | POA: Diagnosis not present

## 2016-03-20 DIAGNOSIS — W19XXXA Unspecified fall, initial encounter: Secondary | ICD-10-CM | POA: Diagnosis not present

## 2016-03-20 DIAGNOSIS — R0602 Shortness of breath: Secondary | ICD-10-CM | POA: Diagnosis not present

## 2016-03-20 DIAGNOSIS — R918 Other nonspecific abnormal finding of lung field: Secondary | ICD-10-CM | POA: Diagnosis not present

## 2016-03-20 DIAGNOSIS — R51 Headache: Secondary | ICD-10-CM | POA: Diagnosis not present

## 2016-03-20 DIAGNOSIS — Z043 Encounter for examination and observation following other accident: Secondary | ICD-10-CM | POA: Diagnosis not present

## 2016-03-20 LAB — CBC WITH DIFFERENTIAL/PLATELET
Basophils Absolute: 0 10*3/uL (ref 0.0–0.1)
Basophils Relative: 0 %
Eosinophils Absolute: 0.1 10*3/uL (ref 0.0–0.7)
Eosinophils Relative: 2 %
HCT: 35.3 % — ABNORMAL LOW (ref 36.0–46.0)
Hemoglobin: 11.4 g/dL — ABNORMAL LOW (ref 12.0–15.0)
Lymphocytes Relative: 43 %
Lymphs Abs: 2.5 10*3/uL (ref 0.7–4.0)
MCH: 21.3 pg — ABNORMAL LOW (ref 26.0–34.0)
MCHC: 32.3 g/dL (ref 30.0–36.0)
MCV: 66 fL — ABNORMAL LOW (ref 78.0–100.0)
Monocytes Absolute: 0.5 10*3/uL (ref 0.1–1.0)
Monocytes Relative: 8 %
Neutro Abs: 2.7 10*3/uL (ref 1.7–7.7)
Neutrophils Relative %: 47 %
Platelets: 319 10*3/uL (ref 150–400)
RBC: 5.35 MIL/uL — ABNORMAL HIGH (ref 3.87–5.11)
RDW: 17.1 % — ABNORMAL HIGH (ref 11.5–15.5)
WBC: 5.8 10*3/uL (ref 4.0–10.5)

## 2016-03-20 LAB — COMPREHENSIVE METABOLIC PANEL
ALT: 10 U/L — ABNORMAL LOW (ref 14–54)
AST: 12 U/L — ABNORMAL LOW (ref 15–41)
Albumin: 3.3 g/dL — ABNORMAL LOW (ref 3.5–5.0)
Alkaline Phosphatase: 54 U/L (ref 38–126)
Anion gap: 6 (ref 5–15)
BUN: 9 mg/dL (ref 6–20)
CO2: 26 mmol/L (ref 22–32)
Calcium: 9.4 mg/dL (ref 8.9–10.3)
Chloride: 108 mmol/L (ref 101–111)
Creatinine, Ser: 0.87 mg/dL (ref 0.44–1.00)
GFR calc Af Amer: >60 mL/min (ref >60)
GFR calc non Af Amer: >60 mL/min (ref >60)
Glucose, Bld: 91 mg/dL (ref 65–99)
Potassium: 4 mmol/L (ref 3.5–5.1)
Sodium: 140 mmol/L (ref 135–145)
Total Bilirubin: 0.6 mg/dL (ref 0.3–1.2)
Total Protein: 6.5 g/dL (ref 6.5–8.1)

## 2016-03-20 LAB — TROPONIN I: Troponin I: <0.03 ng/mL (ref <0.03)

## 2016-03-20 LAB — BRAIN NATRIURETIC PEPTIDE: B Natriuretic Peptide: 88.3 pg/mL (ref 0.0–100.0)

## 2016-03-20 LAB — LIPASE, BLOOD: Lipase: 26 U/L (ref 11–51)

## 2016-03-20 MED ORDER — ACETAMINOPHEN 500 MG PO TABS
1000.0000 mg | ORAL_TABLET | Freq: Once | ORAL | Status: AC
  Administered 2016-03-20: 1000 mg via ORAL
  Filled 2016-03-20: qty 2

## 2016-03-20 MED ORDER — SODIUM CHLORIDE 0.9 % IV BOLUS (SEPSIS)
1000.0000 mL | Freq: Once | INTRAVENOUS | Status: AC
  Administered 2016-03-20: 1000 mL via INTRAVENOUS

## 2016-03-20 MED ORDER — ONDANSETRON 4 MG PO TBDP
8.0000 mg | ORAL_TABLET | Freq: Once | ORAL | Status: AC
  Administered 2016-03-20: 8 mg via ORAL
  Filled 2016-03-20: qty 2

## 2016-03-20 MED ORDER — FENTANYL CITRATE (PF) 100 MCG/2ML IJ SOLN
50.0000 ug | Freq: Once | INTRAMUSCULAR | Status: AC
  Administered 2016-03-20: 50 ug via INTRAVENOUS
  Filled 2016-03-20: qty 2

## 2016-03-20 NOTE — ED Notes (Signed)
[facility] Called to transport pt to [facility]

## 2016-03-20 NOTE — Telephone Encounter (Signed)
Pt. Called requesting to speak with her PCP b/c she was suppose to be admitted to [facility] but she received a call form [facility] and they Stated that she was not going to be admitted anymore b/c her insurance is out of network. Please f/u.

## 2016-03-20 NOTE — ED Notes (Addendum)
Ems states pt started having chest pain and leg pain since  yesterday. Pt has a history of blood clots.  Pt has an appt at [facility] on Monday for a possible blood clot filter. Pt is unsure of the procedure. Pt states she is also having Angioplasty as well.

## 2016-03-20 NOTE — ED Provider Notes (Signed)
CSN: [id number]     Arrival date & time 03/20/16  1155 History   First MD Initiated Contact with Patient 03/20/16 1204     Chief Complaint  Patient presents with  . Chest Pain     (Consider location/radiation/quality/duration/timing/severity/associated sxs/prior Treatment) HPI Patient presents with multiple concerns. Essentially the patient has ongoing evaluation for pulmonary embolism, pulmonary hypertension, both diagnosed earlier this year. She notes that she persistently has ongoing discomfort, dyspnea, fatigue, pain. However, over the past day she has developed new left-sided pain in her chest, arm, leg, as well as numbness per No new fever, syncope, complete lack of sensation or strength in any extremity. After discussion with her specialist team she was sent here for evaluation. No new medication changes. Notably, the patient is scheduled for heart catheterization, placement of IVC filter, in 5 days. Patient currently takes Xarelto. Past Medical History  Diagnosis Date  . Pulmonary embolism ([place])   . Hypertension   . Obesity   . Arthritis   . Pneumonia   . Diverticulitis    Past Surgical History  Procedure Laterality Date  . Cholecystectomy    . Breast reduction surgery    . Abdominal hysterectomy     Family History  Problem Relation Age of Onset  . Lung cancer Maternal Grandmother    Social History  Substance Use Topics  . Smoking status: Never Smoker   . Smokeless tobacco: None  . Alcohol Use: No   OB History    No data available     Review of Systems  Constitutional:       Per HPI, otherwise negative  HENT:       Per HPI, otherwise negative  Respiratory:       Per HPI, otherwise negative  Cardiovascular:       Per HPI, otherwise negative  Gastrointestinal: Negative for vomiting.  Endocrine:       Negative aside from HPI  Genitourinary:       Neg aside from HPI   Musculoskeletal:       Per HPI, otherwise negative  Skin: Negative.    Neurological: Positive for weakness, light-headedness and numbness. Negative for syncope.      Allergies  Review of patient's allergies indicates no known allergies.  Home Medications   Prior to Admission medications   Medication Sig Start Date End Date Taking? Authorizing Provider  acyclovir (ZOVIRAX) 800 MG tablet Take 1 tablet (800 mg total) by mouth 5 (five) times daily. 03/13/16   Maren Reamer, MD  albuterol (PROVENTIL HFA;VENTOLIN HFA) 108 (90 Base) MCG/ACT inhaler Inhale 1-2 puffs into the lungs every 6 (six) hours as needed for wheezing or shortness of breath. 02/08/16   Maryan Puls, MD  ALPRAZolam Duanne Moron) 1 MG tablet Take 1 mg by mouth 2 (two) times daily as needed for anxiety or sleep.    Historical Provider, MD  amLODipine (NORVASC) 5 MG tablet Take 1 tablet (5 mg total) by mouth daily. 12/12/15   Maren Reamer, MD  atenolol (TENORMIN) 100 MG tablet Take 1 tablet (100 mg total) by mouth daily. Reported on 01/31/2016 01/31/16   Maren Reamer, MD  budesonide-formoterol [facility]) 160-4.5 MCG/ACT inhaler Inhale 2 puffs into the lungs 2 (two) times daily.    Historical Provider, MD  citalopram (CELEXA) 20 MG tablet Take 1 tablet (20 mg total) by mouth daily. Reported on 02/20/2016 02/20/16   Maren Reamer, MD  clotrimazole (LOTRIMIN) 1 % cream Apply 1 application topically 2 (two) times  daily. Apply to left inner thigh 03/06/16   Maren Reamer, MD  diclofenac sodium (VOLTAREN) 1 % GEL Apply 2 g topically 4 (four) times daily. Patient not taking: Reported on 03/06/2016 01/31/16   Maren Reamer, MD  dicyclomine (BENTYL) 20 MG tablet Take 20 mg by mouth 2 (two) times daily. Reported on 03/06/2016    Historical Provider, MD  fluconazole (DIFLUCAN) 150 MG tablet Take 1 tablet (150 mg total) by mouth daily. 03/06/16   Maren Reamer, MD  hydrochlorothiazide (MICROZIDE) 12.5 MG capsule Take 1 capsule (12.5 mg total) by mouth daily as needed. 02/20/16   Maren Reamer, MD   Lidocaine-Prilocaine, Bulk, A999333 % CREA 1 application by Does not apply route 2 (two) times daily as needed. Patient taking differently: Apply 1 application topically 2 (two) times daily as needed.  02/06/16   Maren Reamer, MD  meclizine (ANTIVERT) 25 MG tablet Take 1 tablet (25 mg total) by mouth 3 (three) times daily as needed for dizziness. 02/20/16   Maren Reamer, MD  Melatonin 3 MG TABS Take 1 tablet (3 mg total) by mouth at bedtime. 03/06/16   Maren Reamer, MD  nystatin (MYCOSTATIN/NYSTOP) 100000 UNIT/GM POWD Apply 5,000 g topically 3 (three) times daily. Vaginal area 11/20/15   Maren Reamer, MD  ondansetron (ZOFRAN) 4 MG tablet Take 1 tablet (4 mg total) by mouth every 8 (eight) hours as needed for nausea or vomiting. Patient not taking: Reported on 03/06/2016 12/19/15   Maren Reamer, MD  oxyCODONE-acetaminophen (PERCOCET/ROXICET) 5-325 MG tablet Take 1 tablet by mouth every 6 (six) hours as needed for severe pain. 02/08/16   Maryan Puls, MD  polyethylene glycol (MIRALAX / GLYCOLAX) packet Take 17 g by mouth daily as needed for mild constipation.    Historical Provider, MD  rivaroxaban (XARELTO) 20 MG TABS tablet Take 1 tablet (20 mg total) by mouth daily with supper. 02/20/16   Maren Reamer, MD  temazepam (RESTORIL) 7.5 MG capsule Take 1 capsule (7.5 mg total) by mouth at bedtime as needed for sleep. Patient not taking: Reported on 02/20/2016 12/12/15   Maren Reamer, MD  zolpidem (AMBIEN) 5 MG tablet Take 5 mg by mouth at bedtime as needed for sleep. Reported on 03/06/2016    Historical Provider, MD   BP 141/76 mmHg  Pulse 46  Temp(Src) 98.2 F (36.8 C) (Oral)  Resp 16  Ht 5\' 5"  (1.651 m)  Wt 320 lb (145.151 kg)  BMI 53.25 kg/m2  SpO2 97% Physical Exam  Constitutional: She is oriented to person, place, and time. She appears well-developed and well-nourished. No distress.  Morbidly obese F, in NAD  HENT:  Head: Normocephalic and atraumatic.  Eyes: Conjunctivae  and EOM are normal.  Cardiovascular: Normal rate and regular rhythm.   Pulmonary/Chest: Effort normal and breath sounds normal. No stridor. No respiratory distress.  R  Upper chest in place  Abdominal: She exhibits no distension.  Musculoskeletal: She exhibits no edema.  Neurological: She is alert and oriented to person, place, and time. No cranial nerve deficit.  Skin: Skin is warm and dry.  Psychiatric: She has a normal mood and affect.  Nursing note and vitals reviewed.   ED Course  Procedures (including critical care time) Labs Review Labs Reviewed  COMPREHENSIVE METABOLIC PANEL - Abnormal; Notable for the following:    Albumin 3.3 (*)    AST 12 (*)    ALT 10 (*)    All other components  within normal limits  CBC WITH DIFFERENTIAL/PLATELET - Abnormal; Notable for the following:    RBC 5.35 (*)    Hemoglobin 11.4 (*)    HCT 35.3 (*)    MCV 66.0 (*)    MCH 21.3 (*)    RDW 17.1 (*)    All other components within normal limits  LIPASE, BLOOD  BRAIN NATRIURETIC PEPTIDE  TROPONIN I    Imaging Review Dg Chest 2 View  03/20/2016  CLINICAL DATA:  Acute left chest pain today. EXAM: CHEST  2 VIEW COMPARISON:  02/08/2016 CT and chest radiograph FINDINGS: Mild cardiomegaly and mild pulmonary vascular congestion noted. Mild right basilar scarring again noted. A right Port-A-Cath with tip overlying the superior cavoatrial junction again noted. There is no evidence of focal airspace disease, pulmonary edema, suspicious pulmonary nodule/mass, pleural effusion, or pneumothorax. No acute bony abnormalities are identified. IMPRESSION: Mild cardiomegaly with mild pulmonary vascular congestion. Electronically Signed   By: Margarette Canada M.D.   On: 03/20/2016 14:00   I have personally reviewed and evaluated these images and lab results as part of my medical decision-making.   EKG Interpretation   Date/Time:  Wednesday March 20 2016 12:00:17 EDT Ventricular Rate:  54 PR Interval:    QRS  Duration: 96 QT Interval:  444 QTC Calculation: 421 R Axis:   52 Text Interpretation:  Sinus rhythm Nonspecific T abnormalities, anterior  leads Abnormal ekg Confirmed by Carmin Muskrat  MD [phone]) on 03/20/2016  12:27:57 PM     V/Q at [facility] 7/6: Chest radiograph: Right central venous catheter position with tip over  superior right atrium. Mildly enlarged cardiac contour prominent central  pulmonary vasculature. Linear opacities right lower lung. No other abnormal  pulmonary opacities no significant pleural effusions.    Ventilation scan: On the breath-hold image, there is good tracer uptake  bilaterally. On the washout images, there is mild xenon retention in  bilateral bases.     Perfusion scan: Peripheral appearing perfusion defects demonstrated in the  right lung base, right middle lobe and posterior segment of the upper lobe,  anterior segment of the left upper lobe. Fundings are mismatched with  ventilation imaging.     Conclusions:    1.High probability lung scan for pulmonary embolism (PE present). Of  note, findings above can be demonstrated in a setting of acute or chronic  PE.   On repeat exam the patient remains in similar condition, complaining of left-sided chest pain, mild lightheadedness. Vital signs remain similar, heart rate 40s/50s, blood pressure appropriate. I discussed patient's results with her and multiple family members.   I discussed patient's case with her pulmonologist at [facility], as well as the local pulmonology specialist here. We agreed that given the patient's persistent dyspnea, chest pain, lightheadedness, and no pulmonary embolism, she will be transferred to [facility] for further evaluation and management.   MDM  Patient with known pulmonary embolism, pulmonary hypertension presents with new left-sided chest pain. Here the patient is mildly bradycardic, but has normal blood pressure. Initial labs reassuring, with no  evidence for new coronary ischemia. However, the patient remains dyspneic, with chest pain, mild lightheadedness throughout her hours of monitoring. Given the patient's known pulmonary embolism, ongoing chest pain, I discussed her case with her specialists, and the patient required transfer to [facility] where her pulmonology critical care team will evaluate her.   Carmin Muskrat, MD 03/20/16 [phone]

## 2016-03-20 NOTE — ED Notes (Signed)
Report called to Charge Nurse at [facility] ED, Idell Pickles, RN

## 2016-03-21 ENCOUNTER — Telehealth: Payer: Self-pay | Admitting: Pulmonary Disease

## 2016-03-21 DIAGNOSIS — J9601 Acute respiratory failure with hypoxia: Secondary | ICD-10-CM | POA: Diagnosis not present

## 2016-03-21 DIAGNOSIS — B029 Zoster without complications: Secondary | ICD-10-CM | POA: Diagnosis not present

## 2016-03-21 DIAGNOSIS — I2699 Other pulmonary embolism without acute cor pulmonale: Secondary | ICD-10-CM | POA: Diagnosis not present

## 2016-03-21 DIAGNOSIS — I2690 Septic pulmonary embolism without acute cor pulmonale: Secondary | ICD-10-CM | POA: Diagnosis not present

## 2016-03-21 DIAGNOSIS — R739 Hyperglycemia, unspecified: Secondary | ICD-10-CM | POA: Diagnosis not present

## 2016-03-21 DIAGNOSIS — Q8789 Other specified congenital malformation syndromes, not elsewhere classified: Secondary | ICD-10-CM | POA: Diagnosis not present

## 2016-03-21 DIAGNOSIS — I272 Other secondary pulmonary hypertension: Secondary | ICD-10-CM | POA: Diagnosis not present

## 2016-03-21 DIAGNOSIS — I517 Cardiomegaly: Secondary | ICD-10-CM | POA: Diagnosis not present

## 2016-03-21 DIAGNOSIS — R001 Bradycardia, unspecified: Secondary | ICD-10-CM | POA: Diagnosis not present

## 2016-03-21 DIAGNOSIS — Z789 Other specified health status: Secondary | ICD-10-CM | POA: Diagnosis not present

## 2016-03-21 DIAGNOSIS — J90 Pleural effusion, not elsewhere classified: Secondary | ICD-10-CM | POA: Diagnosis not present

## 2016-03-21 DIAGNOSIS — G8918 Other acute postprocedural pain: Secondary | ICD-10-CM | POA: Diagnosis not present

## 2016-03-21 DIAGNOSIS — Z452 Encounter for adjustment and management of vascular access device: Secondary | ICD-10-CM | POA: Diagnosis not present

## 2016-03-21 DIAGNOSIS — R0789 Other chest pain: Secondary | ICD-10-CM | POA: Diagnosis not present

## 2016-03-21 DIAGNOSIS — R9431 Abnormal electrocardiogram [ECG] [EKG]: Secondary | ICD-10-CM | POA: Diagnosis not present

## 2016-03-21 DIAGNOSIS — R0609 Other forms of dyspnea: Secondary | ICD-10-CM | POA: Diagnosis not present

## 2016-03-21 DIAGNOSIS — I110 Hypertensive heart disease with heart failure: Secondary | ICD-10-CM | POA: Diagnosis not present

## 2016-03-21 DIAGNOSIS — I509 Heart failure, unspecified: Secondary | ICD-10-CM | POA: Diagnosis not present

## 2016-03-21 DIAGNOSIS — Z6841 Body Mass Index (BMI) 40.0 and over, adult: Secondary | ICD-10-CM | POA: Diagnosis not present

## 2016-03-21 DIAGNOSIS — R570 Cardiogenic shock: Secondary | ICD-10-CM | POA: Diagnosis not present

## 2016-03-21 DIAGNOSIS — R918 Other nonspecific abnormal finding of lung field: Secondary | ICD-10-CM | POA: Diagnosis not present

## 2016-03-21 DIAGNOSIS — I2782 Chronic pulmonary embolism: Secondary | ICD-10-CM | POA: Diagnosis not present

## 2016-03-21 DIAGNOSIS — R42 Dizziness and giddiness: Secondary | ICD-10-CM | POA: Diagnosis not present

## 2016-03-21 DIAGNOSIS — Z4682 Encounter for fitting and adjustment of non-vascular catheter: Secondary | ICD-10-CM | POA: Diagnosis not present

## 2016-03-21 DIAGNOSIS — J9811 Atelectasis: Secondary | ICD-10-CM | POA: Diagnosis not present

## 2016-03-21 DIAGNOSIS — T884XXA Failed or difficult intubation, initial encounter: Secondary | ICD-10-CM | POA: Diagnosis not present

## 2016-03-21 DIAGNOSIS — G4733 Obstructive sleep apnea (adult) (pediatric): Secondary | ICD-10-CM | POA: Diagnosis not present

## 2016-03-21 DIAGNOSIS — D62 Acute posthemorrhagic anemia: Secondary | ICD-10-CM | POA: Diagnosis not present

## 2016-03-21 NOTE — Telephone Encounter (Signed)
I also discussed with ED physician Dr. Vanita Panda to help with the transfer

## 2016-03-21 NOTE — Telephone Encounter (Signed)
I was made aware that [name] [name] was in ED with chest pain. I spoke with her and discussed case with Dr. Karena Addison at [facility] over the phone. 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 She will be evaluated there with PA angiogram and RHC/LHC.  I also discussed with ED physician Dr.   [name] MD [facility] Pager [phone] If no answer or after 3pm call: [phone] 03/21/2016, 10:29 AM

## 2016-03-29 DIAGNOSIS — T884XXA Failed or difficult intubation, initial encounter: Secondary | ICD-10-CM | POA: Insufficient documentation

## 2016-04-12 MED FILL — XARELTO 20 MG TABLET: 20 | 30 days supply | Qty: 30 | Fill #2

## 2016-04-17 ENCOUNTER — Other Ambulatory Visit: Payer: Self-pay | Admitting: Internal Medicine

## 2016-04-22 ENCOUNTER — Telehealth: Payer: Self-pay | Admitting: Internal Medicine

## 2016-04-22 NOTE — Telephone Encounter (Signed)
dw pt, just left [facility] after 22 days hospitalization, sp pulm endarterectomy w/ cardiopulm bypass by Dr Marney Setting pm 03/29/16.  She has been home for about 2 wks now, slowly improving, c/o of surgical incision pain.  Has f/u w/ [facility] and CTS Dr Lincoln Brigham this Thursday. Has f/u appt w/ me 04/30/16.

## 2016-04-24 ENCOUNTER — Encounter: Payer: Self-pay | Admitting: Internal Medicine

## 2016-04-24 ENCOUNTER — Ambulatory Visit: Payer: Commercial Managed Care - HMO | Attending: Internal Medicine | Admitting: Internal Medicine

## 2016-04-24 DIAGNOSIS — Z6841 Body Mass Index (BMI) 40.0 and over, adult: Secondary | ICD-10-CM | POA: Insufficient documentation

## 2016-04-24 DIAGNOSIS — I2692 Saddle embolus of pulmonary artery without acute cor pulmonale: Secondary | ICD-10-CM

## 2016-04-24 DIAGNOSIS — M1990 Unspecified osteoarthritis, unspecified site: Secondary | ICD-10-CM | POA: Insufficient documentation

## 2016-04-24 DIAGNOSIS — Z86711 Personal history of pulmonary embolism: Secondary | ICD-10-CM | POA: Diagnosis not present

## 2016-04-24 DIAGNOSIS — Z7901 Long term (current) use of anticoagulants: Secondary | ICD-10-CM | POA: Insufficient documentation

## 2016-04-24 DIAGNOSIS — I2602 Saddle embolus of pulmonary artery with acute cor pulmonale: Secondary | ICD-10-CM

## 2016-04-24 DIAGNOSIS — I10 Essential (primary) hypertension: Secondary | ICD-10-CM | POA: Diagnosis not present

## 2016-04-24 DIAGNOSIS — E669 Obesity, unspecified: Secondary | ICD-10-CM | POA: Insufficient documentation

## 2016-04-24 DIAGNOSIS — I2782 Chronic pulmonary embolism: Secondary | ICD-10-CM

## 2016-04-24 MED ORDER — POTASSIUM CHLORIDE ER 10 MEQ PO TBCR: 10.0000 meq | EXTENDED_RELEASE_TABLET | Freq: Two times a day (BID) | ORAL | 3 refills | Status: DC

## 2016-04-24 MED ORDER — GABAPENTIN 100 MG PO CAPS: 100.0000 mg | ORAL_CAPSULE | Freq: Two times a day (BID) | ORAL | 3 refills | Status: DC | PRN

## 2016-04-24 MED FILL — GABAPENTIN 100 MG CAPSULE: 100 | 45 days supply | Qty: 90 | Fill #0

## 2016-04-24 MED FILL — POTASSIUM CL 10 MEQ TAB SA: 10 | 30 days supply | Qty: 60 | Fill #0

## 2016-04-24 NOTE — Progress Notes (Signed)
Complicated hx with recent pulmonary endarterectomy at [facility].  She is feeling better. Has less SOB She has some CP- but that is improving. Her pain mgmt is being monitored by [facility] No fever, chills  Notes that the surgical site is tender Sleeping ok  Eating ok  Past Medical History:  Diagnosis Date  . Arthritis   . Diverticulitis   . Hypertension   . Obesity   . Pneumonia   . Pulmonary embolism [facility])     Social History   Social History  . Marital status: Married    Spouse name: N/A  . Number of children: N/A  . Years of education: N/A   Occupational History  . Not on file.   Social History Main Topics  . Smoking status: Never Smoker  . Smokeless tobacco: Not on file  . Alcohol use No  . Drug use: No  . Sexual activity: Not on file   Other Topics Concern  . Not on file   Social History Narrative  . No narrative on file    Past Surgical History:  Procedure Laterality Date  . ABDOMINAL HYSTERECTOMY    . BREAST REDUCTION SURGERY    . CHOLECYSTECTOMY      Family History  Problem Relation Age of Onset  . Lung cancer Maternal Grandmother     No Known Allergies  Current Outpatient Prescriptions on File Prior to Visit  Medication Sig Dispense Refill  . acyclovir (ZOVIRAX) 800 MG tablet Take 1 tablet (800 mg total) by mouth 5 (five) times daily. 35 tablet 0  . albuterol (PROVENTIL HFA;VENTOLIN HFA) 108 (90 Base) MCG/ACT inhaler Inhale 1-2 puffs into the lungs every 6 (six) hours as needed for wheezing or shortness of breath. 6.7 g 5  . ALPRAZolam (XANAX) 1 MG tablet Take 1 mg by mouth 2 (two) times daily as needed for anxiety or sleep.    Marland Kitchen amLODipine (NORVASC) 5 MG tablet Take 1 tablet (5 mg total) by mouth daily. 90 tablet 3  . atenolol (TENORMIN) 100 MG tablet Take 1 tablet (100 mg total) by mouth daily. Reported on 01/31/2016 90 tablet 2  . budesonide-formoterol (SYMBICORT) 160-4.5 MCG/ACT inhaler Inhale 2 puffs into the lungs 2 (two) times daily.    .  citalopram (CELEXA) 20 MG tablet Take 1 tablet (20 mg total) by mouth daily. Reported on 02/20/2016 90 tablet 3  . clotrimazole (LOTRIMIN) 1 % cream Apply 1 application topically 2 (two) times daily. Apply to left inner thigh 30 g 0  . dicyclomine (BENTYL) 20 MG tablet Take 20 mg by mouth 2 (two) times daily. Reported on 03/06/2016    . fluconazole (DIFLUCAN) 150 MG tablet Take 1 tablet (150 mg total) by mouth daily. 3 tablet 0  . hydrochlorothiazide (MICROZIDE) 12.5 MG capsule Take 1 capsule (12.5 mg total) by mouth daily as needed. 30 capsule 2  . meclizine (ANTIVERT) 25 MG tablet Take 1 tablet (25 mg total) by mouth 3 (three) times daily as needed for dizziness. 30 tablet 3  . Melatonin 3 MG TABS Take 1 tablet (3 mg total) by mouth at bedtime. 90 tablet 3  . nystatin (MYCOSTATIN/NYSTOP) 100000 UNIT/GM POWD Apply 5,000 g topically 3 (three) times daily. Vaginal area 60 g 0  . oxyCODONE-acetaminophen (PERCOCET/ROXICET) 5-325 MG tablet Take 1 tablet by mouth every 6 (six) hours as needed for severe pain. 15 tablet 0  . rivaroxaban (XARELTO) 20 MG TABS tablet Take 1 tablet (20 mg total) by mouth daily with supper. 90 tablet  3  . zolpidem (AMBIEN) 5 MG tablet Take 5 mg by mouth at bedtime as needed for sleep. Reported on 03/06/2016     No current facility-administered medications on file prior to visit.      patient denies chest pain, shortness of breath, orthopnea. Denies lower extremity edema, abdominal pain, change in appetite, change in bowel movements. Patient denies rashes, musculoskeletal complaints. No other specific complaints in a complete review of systems.   BP 122/70   Pulse 88   Temp 98.2 F (36.8 C) (Oral)   Resp 16   Ht 5\' 5"  (1.651 m)   Wt (!) 312 lb 3.2 oz (141.6 kg)   SpO2 97%   BMI 51.95 kg/m  Obese female NAD Chest CTA CV- reg rate Surgical wound- sternal - healing well - multiple staples extr- no edema  Pulmonary embolus ([place]) She underwent pulmonary embolectomy at  [facility] in July 2017 She is recovering nicely and has f/u with [facility] tomorrow  We will see her back in 4-6 weeks

## 2016-04-24 NOTE — Addendum Note (Signed)
Addended by: Trecia Rogers on: 04/24/2016 04:55 PM   Modules accepted: Orders

## 2016-04-24 NOTE — Assessment & Plan Note (Signed)
She underwent pulmonary embolectomy at [facility] in July 2017 She is recovering nicely and has f/u with [facility] tomorrow  We will see her back in 4-6 weeks

## 2016-04-24 NOTE — Progress Notes (Addendum)
Patient is here for Surgery FU  Patient complains of intermittent site burning which radiates through her chest to the back and occasionally down her arm.  Patient has taken medication and patient has eaten.  Patient denies any suicidal ideations at this time.

## 2016-04-25 DIAGNOSIS — I4581 Long QT syndrome: Secondary | ICD-10-CM | POA: Diagnosis not present

## 2016-04-25 DIAGNOSIS — J90 Pleural effusion, not elsewhere classified: Secondary | ICD-10-CM | POA: Diagnosis not present

## 2016-04-25 DIAGNOSIS — I272 Other secondary pulmonary hypertension: Secondary | ICD-10-CM | POA: Diagnosis not present

## 2016-04-25 DIAGNOSIS — Z79891 Long term (current) use of opiate analgesic: Secondary | ICD-10-CM | POA: Diagnosis not present

## 2016-04-25 DIAGNOSIS — R9431 Abnormal electrocardiogram [ECG] [EKG]: Secondary | ICD-10-CM | POA: Diagnosis not present

## 2016-04-25 DIAGNOSIS — J9811 Atelectasis: Secondary | ICD-10-CM | POA: Diagnosis not present

## 2016-04-25 DIAGNOSIS — G8918 Other acute postprocedural pain: Secondary | ICD-10-CM | POA: Diagnosis not present

## 2016-04-25 DIAGNOSIS — I2782 Chronic pulmonary embolism: Secondary | ICD-10-CM | POA: Diagnosis not present

## 2016-04-25 DIAGNOSIS — I4510 Unspecified right bundle-branch block: Secondary | ICD-10-CM | POA: Diagnosis not present

## 2016-04-25 DIAGNOSIS — Z9889 Other specified postprocedural states: Secondary | ICD-10-CM | POA: Diagnosis not present

## 2016-04-25 DIAGNOSIS — Z48813 Encounter for surgical aftercare following surgery on the respiratory system: Secondary | ICD-10-CM | POA: Diagnosis not present

## 2016-04-25 DIAGNOSIS — Z86711 Personal history of pulmonary embolism: Secondary | ICD-10-CM | POA: Diagnosis not present

## 2016-04-25 DIAGNOSIS — Z6841 Body Mass Index (BMI) 40.0 and over, adult: Secondary | ICD-10-CM | POA: Diagnosis not present

## 2016-04-30 ENCOUNTER — Encounter: Payer: Self-pay | Admitting: Internal Medicine

## 2016-04-30 ENCOUNTER — Ambulatory Visit: Payer: Commercial Managed Care - HMO | Attending: Internal Medicine | Admitting: Internal Medicine

## 2016-04-30 VITALS — BP 178/100 | HR 69 | Temp 98.6°F | Resp 16 | Wt 316.4 lb

## 2016-04-30 DIAGNOSIS — Z23 Encounter for immunization: Secondary | ICD-10-CM | POA: Diagnosis not present

## 2016-04-30 DIAGNOSIS — Z7901 Long term (current) use of anticoagulants: Secondary | ICD-10-CM | POA: Insufficient documentation

## 2016-04-30 DIAGNOSIS — M1990 Unspecified osteoarthritis, unspecified site: Secondary | ICD-10-CM | POA: Diagnosis not present

## 2016-04-30 DIAGNOSIS — I2602 Saddle embolus of pulmonary artery with acute cor pulmonale: Secondary | ICD-10-CM

## 2016-04-30 DIAGNOSIS — Z86711 Personal history of pulmonary embolism: Secondary | ICD-10-CM | POA: Diagnosis not present

## 2016-04-30 DIAGNOSIS — Z9989 Dependence on other enabling machines and devices: Secondary | ICD-10-CM

## 2016-04-30 DIAGNOSIS — Z79899 Other long term (current) drug therapy: Secondary | ICD-10-CM | POA: Diagnosis not present

## 2016-04-30 DIAGNOSIS — I2692 Saddle embolus of pulmonary artery without acute cor pulmonale: Secondary | ICD-10-CM

## 2016-04-30 DIAGNOSIS — I2782 Chronic pulmonary embolism: Secondary | ICD-10-CM

## 2016-04-30 DIAGNOSIS — E669 Obesity, unspecified: Secondary | ICD-10-CM | POA: Insufficient documentation

## 2016-04-30 DIAGNOSIS — F419 Anxiety disorder, unspecified: Secondary | ICD-10-CM | POA: Insufficient documentation

## 2016-04-30 DIAGNOSIS — G4733 Obstructive sleep apnea (adult) (pediatric): Secondary | ICD-10-CM | POA: Diagnosis not present

## 2016-04-30 DIAGNOSIS — I10 Essential (primary) hypertension: Secondary | ICD-10-CM | POA: Diagnosis not present

## 2016-04-30 MED ORDER — AMLODIPINE BESYLATE 5 MG PO TABS: 5.0000 mg | ORAL_TABLET | Freq: Every day | ORAL | 3 refills | Status: DC

## 2016-04-30 MED ORDER — CITALOPRAM HYDROBROMIDE 40 MG PO TABS
40.0000 mg | ORAL_TABLET | Freq: Every day | ORAL | 3 refills | Status: DC
Start: 2016-04-30 — End: 2016-07-18

## 2016-04-30 MED ORDER — GABAPENTIN 100 MG PO CAPS: 100.0000 mg | ORAL_CAPSULE | Freq: Two times a day (BID) | ORAL | 3 refills | Status: DC | PRN

## 2016-04-30 MED ORDER — ZALEPLON 5 MG PO CAPS: 5.0000 mg | ORAL_CAPSULE | Freq: Every evening | ORAL | 2 refills | Status: DC | PRN

## 2016-04-30 NOTE — Progress Notes (Signed)
[name] [name], is a 55 y.o. female  [id number]  [id number]  DOB - 1961/03/21  Chief Complaint  Patient presents with  . Follow-up        Subjective:   [name] [name] is a 55 y.o. female here today for a follow up visit for chronic saddle pulm emboli, sp pulm endarterectomy w/ cardiopulm bypass by Dr Marney Setting pm 03/29/16.  Has since f/u w/ [facility] and CTS on 04/25/16.  Still w/ incisional pain postop, but otherwise sob improved. Mild orthopnea, still sleeping with several pillows.  Here w/ her husband and grandson.  Denies anxiety currently, has been doing well w/ celexa 40mg  qd.   Patient has No headache, No chest pain, No abdominal pain - No Nausea, No new weakness tingling or numbness, No Cough - SOB.  No problems updated.  ALLERGIES: No Known Allergies  PAST MEDICAL HISTORY: Past Medical History:  Diagnosis Date  . Arthritis   . Diverticulitis   . Hypertension   . Obesity   . Pneumonia   . Pulmonary embolism ([place])     MEDICATIONS AT HOME: Prior to Admission medications   Medication Sig Start Date End Date Taking? Authorizing Provider  albuterol (PROVENTIL HFA;VENTOLIN HFA) 108 (90 Base) MCG/ACT inhaler Inhale 1-2 puffs into the lungs every 6 (six) hours as needed for wheezing or shortness of breath. 02/08/16  Yes Maryan Puls, MD  budesonide-formoterol [facility] 160-4.5 MCG/ACT inhaler Inhale 2 puffs into the lungs 2 (two) times daily.   Yes Historical Provider, MD  citalopram (CELEXA) 20 MG tablet Take 1 tablet (20 mg total) by mouth daily. Reported on 02/20/2016 02/20/16  Yes Dorthie Santini Lazarus Gowda, MD  gabapentin (NEURONTIN) 100 MG capsule Take 1 capsule (100 mg total) by mouth every 12 (twelve) hours as needed. 04/24/16  Yes Lisabeth Pick, MD  hydrochlorothiazide (MICROZIDE) 12.5 MG capsule Take 1 capsule (12.5 mg total) by mouth daily as needed. 02/20/16  Yes Maren Reamer, MD  Melatonin 3 MG TABS Take 1 tablet (3 mg total) by mouth at bedtime. 03/06/16   Yes Maren Reamer, MD  potassium chloride (K-DUR) 10 MEQ tablet Take 1 tablet (10 mEq total) by mouth 2 (two) times daily. 04/24/16  Yes Lisabeth Pick, MD  rivaroxaban (XARELTO) 20 MG TABS tablet Take 1 tablet (20 mg total) by mouth daily with supper. 02/20/16  Yes Maren Reamer, MD  zaleplon (SONATA) 5 MG capsule Take 5 mg by mouth at bedtime as needed for sleep.   Yes Historical Provider, MD  acyclovir (ZOVIRAX) 800 MG tablet Take 1 tablet (800 mg total) by mouth 5 (five) times daily. Patient not taking: Reported on 04/30/2016 03/13/16   Maren Reamer, MD  ALPRAZolam Duanne Moron) 1 MG tablet Take 1 mg by mouth 2 (two) times daily as needed for anxiety or sleep.    Historical Provider, MD  amLODipine (NORVASC) 5 MG tablet Take 1 tablet (5 mg total) by mouth daily. Patient not taking: Reported on 04/30/2016 12/12/15   Maren Reamer, MD  atenolol (TENORMIN) 100 MG tablet Take 1 tablet (100 mg total) by mouth daily. Reported on 01/31/2016 Patient not taking: Reported on 04/30/2016 01/31/16   Maren Reamer, MD  clotrimazole (LOTRIMIN) 1 % cream Apply 1 application topically 2 (two) times daily. Apply to left inner thigh Patient not taking: Reported on 04/30/2016 03/06/16   Maren Reamer, MD  dicyclomine (BENTYL) 20 MG tablet Take 20 mg by mouth 2 (two) times daily. Reported on  03/06/2016    Historical Provider, MD  meclizine (ANTIVERT) 25 MG tablet Take 1 tablet (25 mg total) by mouth 3 (three) times daily as needed for dizziness. Patient not taking: Reported on 04/30/2016 02/20/16   Maren Reamer, MD  nystatin (MYCOSTATIN/NYSTOP) 100000 UNIT/GM POWD Apply 5,000 g topically 3 (three) times daily. Vaginal area Patient not taking: Reported on 04/30/2016 11/20/15   Maren Reamer, MD  oxyCODONE-acetaminophen (PERCOCET/ROXICET) 5-325 MG tablet Take 1 tablet by mouth every 6 (six) hours as needed for severe pain. Patient not taking: Reported on 04/30/2016 02/08/16   Maryan Puls, MD     Objective:    There were no vitals filed for this visit.  Exam General appearance : Awake, alert, not in any distress. Speech Clear. Not toxic looking, morbid obese. Not on O2. HEENT: Atraumatic and Normocephalic, pupils equally reactive to light. Neck: supple, no JVD.  Chest:Good air entry bilaterally, no added sounds. CVS: S1 S2 regular, no murmurs/gallups or rubs.  Post surg stern wound well healing. Also 3 CT wounds healing well, located below bilat breast/upper abd. Abdomen: Bowel sounds active, Non tender and not distended with no gaurding, rigidity or rebound. Extremities: B/L Lower Ext shows no edema, both legs are warm to touch Neurology: Awake alert, and oriented X 3, CN II-XII grossly intact, Non focal Skin:No Rash  Data Review No results found for: HGBA1C  Depression screen [facility] 2/9 04/25/2016 03/06/2016 02/20/2016 01/31/2016 11/20/2015  Decreased Interest 3 3 2 2  0  Down, Depressed, Hopeless 2 3 1 2  0  PHQ - 2 Score 5 6 3 4  0  Altered sleeping 3 3 3 3  -  Tired, decreased energy 3 3 2 2  -  Change in appetite 1 2 2 2  -  Feeling bad or failure about yourself  1 2 1 1  -  Trouble concentrating 2 3 1 1  -  Moving slowly or fidgety/restless 0 3 0 0 -  Suicidal thoughts 0 0 0 0 -  PHQ-9 Score 15 22 12 13  -  Difficult doing work/chores - - Somewhat difficult Somewhat difficult -      Assessment & Plan   1. 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 At [facility]. - doing very well postsurg. - was on lasix 40 qday post op, w/ still some c/o of orthopnea, will continue lasix for now.   2. Anxiety - continue celexa 40 qday, appears like helping her tremendously.  3. Malignant htn - which is surprising, she was taking off all bp meds a [facility], normal bps with recent CTS and Pulm f/u at [facility], and w/ Dr Leanne Chang last week as well. - anxiety /pain induced? - on lasix 40 qday - will resume norvasc 5 for  now - f/u next week w/ me for bp chk.  4. OSA on CPAP   5. Health Maintanance - sp complete hysterectomy, per pt, was told did not need pap smear any more. - sp MM last year. - sp colonoscopy last year, will bring in reports if able, told polyps.  6. Flu vaccine today.   Patient have been counseled extensively about nutrition and exercise  Return in about 1 week (around 05/07/2016) for bp chk - fit in 43min thanks.  The patient was given clear instructions to go to ER or return to medical center if symptoms don't improve, worsen or new problems develop. The patient verbalized understanding. The patient was told to  call to get lab results if they haven't heard anything in the next week.   This note has been created with Surveyor, quantity. Any transcriptional errors are unintentional.   Maren Reamer, MD, [facility] [place], [place]   04/30/2016, 4:38 PM

## 2016-04-30 NOTE — Progress Notes (Signed)
Pt is in the office today for a follow-up Pt need refills on medication  Right arm is 182/102

## 2016-05-07 ENCOUNTER — Telehealth: Payer: Self-pay | Admitting: Internal Medicine

## 2016-05-07 MED ORDER — FUROSEMIDE 40 MG PO TABS: 40.0000 mg | ORAL_TABLET | Freq: Every day | ORAL | 0 refills | Status: DC

## 2016-05-07 MED FILL — FUROSEMIDE 20 MG TABLET: 20 | 30 days supply | Qty: 60 | Fill #0

## 2016-05-07 NOTE — Telephone Encounter (Signed)
Furosemide refilled. 

## 2016-05-07 NOTE — Telephone Encounter (Signed)
Pt called requesting medication refill on furosemide (LASIX) 40 MG tablet, please f/up

## 2016-05-08 ENCOUNTER — Ambulatory Visit: Payer: Commercial Managed Care - HMO | Admitting: Internal Medicine

## 2016-05-14 MED FILL — XARELTO 20 MG TABLET: 20 | 30 days supply | Qty: 30 | Fill #3

## 2016-05-14 MED FILL — CITALOPRAM HBR 40 MG TABLET: 40 | 90 days supply | Qty: 45 | Fill #1 | Status: TO

## 2016-05-15 ENCOUNTER — Emergency Department (HOSPITAL_COMMUNITY): Payer: Commercial Managed Care - HMO

## 2016-05-15 ENCOUNTER — Emergency Department (HOSPITAL_COMMUNITY)
Admission: EM | Admit: 2016-05-15 | Discharge: 2016-05-15 | Disposition: A | Discharge status: Home / Self Care | Payer: Commercial Managed Care - HMO | Attending: Emergency Medicine | Admitting: Emergency Medicine

## 2016-05-15 ENCOUNTER — Encounter (HOSPITAL_COMMUNITY): Payer: Self-pay | Admitting: Emergency Medicine

## 2016-05-15 ENCOUNTER — Other Ambulatory Visit: Payer: Self-pay

## 2016-05-15 DIAGNOSIS — R079 Chest pain, unspecified: Secondary | ICD-10-CM

## 2016-05-15 DIAGNOSIS — Z7901 Long term (current) use of anticoagulants: Secondary | ICD-10-CM | POA: Diagnosis not present

## 2016-05-15 DIAGNOSIS — R21 Rash and other nonspecific skin eruption: Secondary | ICD-10-CM

## 2016-05-15 DIAGNOSIS — I2699 Other pulmonary embolism without acute cor pulmonale: Secondary | ICD-10-CM | POA: Diagnosis not present

## 2016-05-15 DIAGNOSIS — Z7982 Long term (current) use of aspirin: Secondary | ICD-10-CM | POA: Diagnosis not present

## 2016-05-15 DIAGNOSIS — R0602 Shortness of breath: Secondary | ICD-10-CM | POA: Diagnosis not present

## 2016-05-15 DIAGNOSIS — Z79899 Other long term (current) drug therapy: Secondary | ICD-10-CM | POA: Insufficient documentation

## 2016-05-15 DIAGNOSIS — I10 Essential (primary) hypertension: Secondary | ICD-10-CM | POA: Insufficient documentation

## 2016-05-15 DIAGNOSIS — R109 Unspecified abdominal pain: Secondary | ICD-10-CM | POA: Diagnosis not present

## 2016-05-15 LAB — CBC WITH DIFFERENTIAL/PLATELET
Basophils Absolute: 0.1 10*3/uL (ref 0.0–0.1)
Basophils Relative: 1 %
Eosinophils Absolute: 0.3 10*3/uL (ref 0.0–0.7)
Eosinophils Relative: 4 %
HCT: 32.8 % — ABNORMAL LOW (ref 36.0–46.0)
Hemoglobin: 10.2 g/dL — ABNORMAL LOW (ref 12.0–15.0)
Lymphocytes Relative: 35 %
Lymphs Abs: 2.3 10*3/uL (ref 0.7–4.0)
MCH: 21 pg — ABNORMAL LOW (ref 26.0–34.0)
MCHC: 31.1 g/dL (ref 30.0–36.0)
MCV: 67.6 fL — ABNORMAL LOW (ref 78.0–100.0)
Monocytes Absolute: 0.6 10*3/uL (ref 0.1–1.0)
Monocytes Relative: 9 %
Neutro Abs: 3.2 10*3/uL (ref 1.7–7.7)
Neutrophils Relative %: 51 %
Platelets: 281 10*3/uL (ref 150–400)
RBC: 4.85 MIL/uL (ref 3.87–5.11)
RDW: 20 % — ABNORMAL HIGH (ref 11.5–15.5)
WBC: 6.5 10*3/uL (ref 4.0–10.5)

## 2016-05-15 LAB — BASIC METABOLIC PANEL
Anion gap: 11 (ref 5–15)
BUN: 8 mg/dL (ref 6–20)
CO2: 22 mmol/L (ref 22–32)
Calcium: 9.9 mg/dL (ref 8.9–10.3)
Chloride: 107 mmol/L (ref 101–111)
Creatinine, Ser: 0.88 mg/dL (ref 0.44–1.00)
GFR calc Af Amer: >60 mL/min (ref >60)
GFR calc non Af Amer: >60 mL/min (ref >60)
Glucose, Bld: 91 mg/dL (ref 65–99)
Potassium: 3.7 mmol/L (ref 3.5–5.1)
Sodium: 140 mmol/L (ref 135–145)

## 2016-05-15 MED ORDER — OXYCODONE-ACETAMINOPHEN 5-325 MG PO TABS
1.0000 | ORAL_TABLET | Freq: Once | ORAL | Status: AC
  Administered 2016-05-15: 1 via ORAL
  Filled 2016-05-15: qty 1

## 2016-05-15 MED ORDER — IOPAMIDOL (ISOVUE-370) INJECTION 76%
INTRAVENOUS | Status: AC
  Administered 2016-05-15: 100 mL
  Filled 2016-05-15: qty 100

## 2016-05-15 MED ORDER — TRIAMCINOLONE ACETONIDE 0.1 % EX CREA: 1.0000 "application " | TOPICAL_CREAM | Freq: Two times a day (BID) | CUTANEOUS | 0 refills | Status: DC

## 2016-05-15 MED ORDER — HEPARIN SOD (PORK) LOCK FLUSH 100 UNIT/ML IV SOLN
500.0000 [IU] | Freq: Once | INTRAVENOUS | Status: AC
  Administered 2016-05-15: 500 [IU]
  Filled 2016-05-15: qty 5

## 2016-05-15 NOTE — ED Notes (Signed)
HS, RN accessed port, CT called and notified pt ready.

## 2016-05-15 NOTE — ED Notes (Signed)
Phlebotomy at bedside.

## 2016-05-15 NOTE — ED Notes (Signed)
Called CT to ask about being able to use pt power port for CTA. CT states we need documentation confirming the type of port that is placed. Pt states her husband in on his way back and has her documentation. Dr. Rogene Houston informed. Will call CT when documentation available.

## 2016-05-15 NOTE — ED Provider Notes (Signed)
I was asked to see the patient returned from CT, angiogram, to discuss her results. CT findings reviewed.  Dg Chest 2 View  Result Date: 05/15/2016 CLINICAL DATA:  Four day history of chest pain and shortness of breath EXAM: CHEST  2 VIEW COMPARISON:  March 20, 2016 FINDINGS: Port-A-Cath tip is in the superior vena cava near the cavoatrial junction. No pneumothorax. There is atelectatic change in both lower lung zone regions. The lungs elsewhere are clear. Heart size and pulmonary vascularity are normal. No adenopathy. Patient is status post median sternotomy. There is degenerative change in the thoracic spine. IMPRESSION: Lower lung zone atelectasis bilaterally. No edema or consolidation. No pneumothorax. Stable cardiac silhouette. Electronically Signed   By: Lowella Grip III M.D.   On: 05/15/2016 11:09   Dg Abdomen 1 View  Result Date: 05/15/2016 CLINICAL DATA:  Abdominal pain. EXAM: ABDOMEN - 1 VIEW COMPARISON:  None. FINDINGS: IVC filter is in place. Prior cholecystectomy. Nonobstructive bowel gas pattern. No free air organomegaly. No acute bony abnormality. IMPRESSION: IVC filter in place. No acute abnormality. Electronically Signed   By: Rolm Baptise M.D.   On: 05/15/2016 13:04   Ct Angio Chest Pe W Or Wo Contrast  Result Date: 05/15/2016 CLINICAL DATA:  Chest pain EXAM: CT ANGIOGRAPHY CHEST WITH CONTRAST TECHNIQUE: Multidetector CT imaging of the chest was performed using the standard protocol during bolus administration of intravenous contrast. Multiplanar CT image reconstructions and MIPs were obtained to evaluate the vascular anatomy. CONTRAST:  100 mL Isovue 370. COMPARISON:  02/08/2016 FINDINGS: Mediastinum/Lymph Nodes: The thoracic inlet is within normal limits. No significant mediastinal or hilar lymphadenopathy is noted. Right chest wall port is noted with the catheter tip in satisfactory position. Cardiovascular: Mild thoracic aortic calcifications are seen. No aneurysmal dilatation is  noted. Pulmonary artery is well visualized. A normal branching pattern is noted. There has been some recanalization of the right lower lobe pulmonary arterial embolus when compared with the prior exam. Forward flow into the right lower lobe is now seen although some mural thrombus remains. No new pulmonary embolus is noted. Lungs/Pleura: The lungs are well aerated bilaterally. Minimal bibasilar atelectatic changes are seen. These changes are similar to that noted on the prior examination. No new focal infiltrate is seen. No parenchymal nodules are noted. Upper abdomen: No acute findings. Musculoskeletal: Changes of prior median sternotomy are noted. Degenerative changes of the thoracic spine are seen. No acute bony abnormality is noted. Review of the MIP images confirms the above findings. IMPRESSION: Persistent but partially recanalized pulmonary embolus within the right lower lobe pulmonary artery. No new pulmonary embolism is identified. Minimal bibasilar atelectatic changes stable from the prior exam. Electronically Signed   By: Inez Catalina M.D.   On: 05/15/2016 19:35    Medications  oxyCODONE-acetaminophen (PERCOCET/ROXICET) 5-325 MG per tablet 1 tablet (1 tablet Oral Given 05/15/16 1146)  iopamidol (ISOVUE-370) 76 % injection (100 mLs  Contrast Given 05/15/16 1727)  oxyCODONE-acetaminophen (PERCOCET/ROXICET) 5-325 MG per tablet 1 tablet (1 tablet Oral Given 05/15/16 1858)    Patient Vitals for the past 24 hrs:  BP Temp Temp src Pulse Resp SpO2  05/15/16 1800 130/72 - - 68 15 98 %  05/15/16 1645 129/65 - - 68 21 100 %  05/15/16 1630 113/76 - - 72 19 99 %  05/15/16 1615 131/64 - - 69 (!) 32 98 %  05/15/16 1600 131/63 - - 73 23 98 %  05/15/16 1530 137/59 - - 63 (!) 29 97 %  05/15/16 1445 135/73 - - 68 (!) 32 96 %  05/15/16 1430 139/69 - - 72 (!) 30 95 %  05/15/16 1415 132/69 - - 75 (!) 33 92 %  05/15/16 1400 134/68 - - 72 25 97 %  05/15/16 1345 135/72 - - 71 (!) 31 95 %  05/15/16 1230 138/81 - -  74 17 91 %  05/15/16 1215 134/75 - - (!) 56 25 100 %  05/15/16 1200 132/76 - - 78 17 93 %  05/15/16 1030 123/91 - - 69 19 100 %  05/15/16 1015 (!) 118/45 - - 62 22 98 %  05/15/16 1000 142/73 - - 63 21 96 %  05/15/16 0945 132/59 - - 69 15 99 %  05/15/16 0935 137/72 - - 68 - 100 %  05/15/16 0904 129/71 98 F (36.7 C) Oral 74 18 100 %    7:40 PM Reevaluation with update and discussion. After initial assessment and treatment, an updated evaluation reveals , At this time. She is fairly comfortable. She the ongoing nature of her chest discomfort. She is also concerned about a rash on her left thigh which the present for quite some time and she is currently using clotrimazole on it. I was able to realize a rash, it is small red papules, 1-2 mm, without associated petechiae, vesicles, drainage or tenderness. This rash is nonspecific and most likely represents a nonspecific dermatitis. Patient agreed to a trial of triamcinolone cream, to see if that improves the problem. She will follow-up with her PCP for further care and management. [name] [name] L    Medical decision-making- stable pulmonary status, status post removal of saddle pulmonary embolus. 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 Doubt ACS, PE, metabolic instability or shingles at this time.    Daleen Bo, MD 05/15/16 [phone]

## 2016-05-15 NOTE — Patient Outreach (Signed)
[facility]) Care Management  05/15/2016  Jamie Hakala November 26, 1960 [id number]   Telephonic Screening   Referral Date:  05/10/16 Source:  [facility] Care Management Issue:  "Recently admitted to [facility] 7/13-8/1 (19 days) for CTEPH S/P Pulmonary embolectomy.  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  She weighs around 317 and would like a healthcoach to work with her on her chronic conditions, wt loss and BP control.  She will complete TOC program through [facility] next week." Insurance:  [employer] Medicare HMO (Gold Plus).   Per [facility] MR:  Flu Vaccine 04/30/2016 BP 178/100 04/30/2016 Weight 316 lb  04/30/2016 Height 65 in  04/24/2016 BMI 52.65 (Obese Class III) 04/30/2016 Admission:  1   03/21/16-04/09/16 (19 day admission / [facility]) for CTEPH S/P Pulmonary embolectomy.   ED visits: 3 Primary MD:  Dr. Maren Reamer      -  last appt:     next appt:   Outreach call #1 to patient.  Patient not reached.   RN CM left HIPAA compliant voice message with name and number.  RN CM scheduled for next outreach call within one week.   Nathaneil Canary, BSN, RN, [facility] Care Management Coordinator [phone] Direct [phone] Cell [phone] Office [phone] Fax [email or web address]

## 2016-05-15 NOTE — ED Notes (Signed)
Called CT and informed them pt is ready for scan.

## 2016-05-15 NOTE — ED Notes (Signed)
Removed MiniPort. Attempted to [employer]. Unable to access. Second RN to try.

## 2016-05-15 NOTE — ED Notes (Signed)
CT called and stated the access to port wasn't the correct kind. Jarrett Soho, RN notified and will access with a power port access.

## 2016-05-15 NOTE — ED Notes (Signed)
Waiting on Heparin from pharmacy to deaccess port prior to discharge

## 2016-05-15 NOTE — ED Notes (Signed)
Patient transported to CT 

## 2016-05-15 NOTE — ED Triage Notes (Signed)
Pt here with CP and SOB x 4 days; pt had recent surgery at [facility] with filter placed; pt is taking xerelto

## 2016-05-15 NOTE — Discharge Instructions (Addendum)
Make an appoint with the follow-up with her primary care doctor. Continue medications. Return for any new or worse symptoms.

## 2016-05-15 NOTE — ED Notes (Signed)
Walked patient to the bathroom patient used walker

## 2016-05-15 NOTE — ED Notes (Signed)
Pt states her husband should be back in the next 15 min.

## 2016-05-15 NOTE — ED Provider Notes (Signed)
[facility] DEPT Provider Note   CSN: [id number] Arrival date & time: 05/15/16  0857     History   Chief Complaint Chief Complaint  Patient presents with  . Chest Pain  . Shortness of Breath    HPI [name] [name] is a 55 y.o. female.  Patient with a long-standing history of frequent pulmonary embolus. Patient is on blood thinners. Patient underwent thrombectomy for a saddle pulmonary embolus at [facility] on July 21. Prior to that had an IVC filter placed. Patient is followed by the wellness clinic for primary care doctor here. Patient's had persistent postoperative pain. That's increased some in the past few days. And patient did have a slip where she had her chest. Patient contacted [facility] and they referred her into the emergency department for evaluation. Patient states his chest pain is more consistent with her incisions and is a chest soreness. Also associated with some increased shortness of breath. Patient feels as if both legs may be more swollen. Review of her primary care notes that she does have a component of anxiety.      Past Medical History:  Diagnosis Date  . Arthritis   . Diverticulitis   . Hypertension   . Obesity   . Pneumonia   . Pulmonary embolism [facility])     Patient Active Problem List   Diagnosis Date Noted  . OSA on CPAP 02/20/2016  . Chronic pain syndrome 01/31/2016  . Vaginal bleeding 11/20/2015  . Hematochezia 11/20/2015  . Pulmonary embolus ([place]) 11/15/2015  . Vertigo 11/15/2015  . Depression 11/15/2015    Past Surgical History:  Procedure Laterality Date  . ABDOMINAL HYSTERECTOMY    . BREAST REDUCTION SURGERY    . CHOLECYSTECTOMY    . EMBOLECTOMY N/A    pulmonary embolectomy    OB History    No data available       Home Medications    Prior to Admission medications   Medication Sig Start Date End Date Taking? Authorizing Provider  acetaminophen (TYLENOL) 500 MG tablet Take 500 mg by mouth every 6 (six) hours as needed for  mild pain.   Yes Historical Provider, MD  albuterol (PROVENTIL HFA;VENTOLIN HFA) 108 (90 Base) MCG/ACT inhaler Inhale 1-2 puffs into the lungs every 6 (six) hours as needed for wheezing or shortness of breath. 02/08/16  Yes Maryan Puls, MD  ALPRAZolam Duanne Moron) 1 MG tablet Take 1 mg by mouth 2 (two) times daily as needed for anxiety or sleep.   Yes Historical Provider, MD  amLODipine (NORVASC) 5 MG tablet Take 1 tablet (5 mg total) by mouth daily. 04/30/16  Yes Maren Reamer, MD  aspirin 81 MG chewable tablet Chew 81 mg by mouth daily.   Yes Historical Provider, MD  budesonide-formoterol (SYMBICORT) 160-4.5 MCG/ACT inhaler Inhale 2 puffs into the lungs 2 (two) times daily.   Yes Historical Provider, MD  citalopram (CELEXA) 40 MG tablet Take 1 tablet (40 mg total) by mouth daily. Reported on 02/20/2016 Patient taking differently: Take 20 mg by mouth daily. Reported on 02/20/2016 04/30/16  Yes Maren Reamer, MD  clotrimazole (LOTRIMIN) 1 % cream Apply 1 application topically 2 (two) times daily. Apply to left inner thigh 03/06/16  Yes Maren Reamer, MD  docusate sodium (COLACE) 100 MG capsule Take 100 mg by mouth daily as needed for mild constipation.   Yes Historical Provider, MD  furosemide (LASIX) 40 MG tablet Take 1 tablet (40 mg total) by mouth daily. 05/07/16  Yes Dawn Lazarus Gowda,  MD  gabapentin (NEURONTIN) 100 MG capsule Take 1 capsule (100 mg total) by mouth every 12 (twelve) hours as needed. Patient taking differently: Take 100 mg by mouth every 12 (twelve) hours as needed (nerve pain).  04/30/16  Yes Maren Reamer, MD  Melatonin 3 MG TABS Take 1 tablet (3 mg total) by mouth at bedtime. Patient taking differently: Take 5 mg by mouth at bedtime.  03/06/16  Yes Maren Reamer, MD  oxyCODONE (OXY IR/ROXICODONE) 5 MG immediate release tablet Take 5 mg by mouth every 4 (four) hours as needed for severe pain.   Yes Historical Provider, MD  potassium chloride (K-DUR) 10 MEQ tablet Take 1 tablet  (10 mEq total) by mouth 2 (two) times daily. 04/24/16  Yes Lisabeth Pick, MD  rivaroxaban (XARELTO) 20 MG TABS tablet Take 1 tablet (20 mg total) by mouth daily with supper. 02/20/16  Yes Maren Reamer, MD  zaleplon (SONATA) 5 MG capsule Take 1 capsule (5 mg total) by mouth at bedtime as needed for sleep. 04/30/16  Yes Maren Reamer, MD  acyclovir (ZOVIRAX) 800 MG tablet Take 1 tablet (800 mg total) by mouth 5 (five) times daily. Patient not taking: Reported on 04/30/2016 03/13/16   Maren Reamer, MD  meclizine (ANTIVERT) 25 MG tablet Take 1 tablet (25 mg total) by mouth 3 (three) times daily as needed for dizziness. Patient not taking: Reported on 04/30/2016 02/20/16   Maren Reamer, MD  nystatin (MYCOSTATIN/NYSTOP) 100000 UNIT/GM POWD Apply 5,000 g topically 3 (three) times daily. Vaginal area Patient not taking: Reported on 04/30/2016 11/20/15   Maren Reamer, MD  oxyCODONE-acetaminophen (PERCOCET/ROXICET) 5-325 MG tablet Take 1 tablet by mouth every 6 (six) hours as needed for severe pain. Patient not taking: Reported on 04/30/2016 02/08/16   Maryan Puls, MD    Family History Family History  Problem Relation Age of Onset  . Lung cancer Maternal Grandmother     Social History Social History  Substance Use Topics  . Smoking status: Never Smoker  . Smokeless tobacco: Not on file  . Alcohol use No     Allergies   Review of patient's allergies indicates no known allergies.   Review of Systems Review of Systems  Constitutional: Negative for fever.  HENT: Negative for congestion.   Eyes: Negative for visual disturbance.  Respiratory: Positive for shortness of breath.   Cardiovascular: Positive for chest pain.  Gastrointestinal: Negative for abdominal pain, nausea and vomiting.  Genitourinary: Negative for dysuria.  Musculoskeletal: Negative for back pain.  Skin: Positive for wound.  Neurological: Negative for headaches.  Hematological: Bruises/bleeds easily.      Physical Exam Updated Vital Signs BP 129/65   Pulse 68   Temp 98 F (36.7 C) (Oral)   Resp 21   SpO2 100%   Physical Exam  Constitutional: She is oriented to person, place, and time. She appears well-developed and well-nourished. No distress.  HENT:  Head: Normocephalic and atraumatic.  Eyes: EOM are normal. Pupils are equal, round, and reactive to light.  Neck: Normal range of motion. Neck supple.  Cardiovascular: Normal rate, regular rhythm and normal heart sounds.   Pulmonary/Chest: Effort normal and breath sounds normal. No respiratory distress.  Median sternotomy incision and chest tube incisions healing well.  Abdominal: Soft. Bowel sounds are normal. There is no tenderness.  Musculoskeletal: Normal range of motion. She exhibits no edema.  Neurological: She is alert and oriented to person, place, and time. No cranial nerve deficit.  She exhibits normal muscle tone. Coordination normal.  Skin: Skin is warm.  Nursing note and vitals reviewed.    ED Treatments / Results  Labs (all labs ordered are listed, but only abnormal results are displayed) Labs Reviewed  CBC WITH DIFFERENTIAL/PLATELET - Abnormal; Notable for the following:       Result Value   Hemoglobin 10.2 (*)    HCT 32.8 (*)    MCV 67.6 (*)    MCH 21.0 (*)    RDW 20.0 (*)    All other components within normal limits  BASIC METABOLIC PANEL   Results for orders placed or performed during the hospital encounter of 05/15/16  CBC with Differential/Platelet  Result Value Ref Range   WBC 6.5 4.0 - 10.5 K/uL   RBC 4.85 3.87 - 5.11 MIL/uL   Hemoglobin 10.2 (L) 12.0 - 15.0 g/dL   HCT 32.8 (L) 36.0 - 46.0 %   MCV 67.6 (L) 78.0 - 100.0 fL   MCH 21.0 (L) 26.0 - 34.0 pg   MCHC 31.1 30.0 - 36.0 g/dL   RDW 20.0 (H) 11.5 - 15.5 %   Platelets 281 150 - 400 K/uL   Neutrophils Relative % 51 %   Lymphocytes Relative 35 %   Monocytes Relative 9 %   Eosinophils Relative 4 %   Basophils Relative 1 %   Neutro Abs  3.2 1.7 - 7.7 K/uL   Lymphs Abs 2.3 0.7 - 4.0 K/uL   Monocytes Absolute 0.6 0.1 - 1.0 K/uL   Eosinophils Absolute 0.3 0.0 - 0.7 K/uL   Basophils Absolute 0.1 0.0 - 0.1 K/uL   RBC Morphology POLYCHROMASIA PRESENT   Basic metabolic panel  Result Value Ref Range   Sodium 140 135 - 145 mmol/L   Potassium 3.7 3.5 - 5.1 mmol/L   Chloride 107 101 - 111 mmol/L   CO2 22 22 - 32 mmol/L   Glucose, Bld 91 65 - 99 mg/dL   BUN 8 6 - 20 mg/dL   Creatinine, Ser 0.88 0.44 - 1.00 mg/dL   Calcium 9.9 8.9 - 10.3 mg/dL   GFR calc non Af Amer >60 >60 mL/min   GFR calc Af Amer >60 >60 mL/min   Anion gap 11 5 - 15     EKG  EKG Interpretation  Date/Time:  Wednesday May 15 2016 09:02:17 EDT Ventricular Rate:  71 PR Interval:  164 QRS Duration: 66 QT Interval:  400 QTC Calculation: 434 R Axis:   89 Text Interpretation:  Normal sinus rhythm Low voltage QRS Nonspecific ST and T wave abnormality Abnormal ECG Confirmed by Layliana Devins  MD, Griffith Santilli ([id number]) on 05/15/2016 10:21:20 AM       Radiology Dg Chest 2 View  Result Date: 05/15/2016 CLINICAL DATA:  Four day history of chest pain and shortness of breath EXAM: CHEST  2 VIEW COMPARISON:  March 20, 2016 FINDINGS: Port-A-Cath tip is in the superior vena cava near the cavoatrial junction. No pneumothorax. There is atelectatic change in both lower lung zone regions. The lungs elsewhere are clear. Heart size and pulmonary vascularity are normal. No adenopathy. Patient is status post median sternotomy. There is degenerative change in the thoracic spine. IMPRESSION: Lower lung zone atelectasis bilaterally. No edema or consolidation. No pneumothorax. Stable cardiac silhouette. Electronically Signed   By: Lowella Grip III M.D.   On: 05/15/2016 11:09   Dg Abdomen 1 View  Result Date: 05/15/2016 CLINICAL DATA:  Abdominal pain. EXAM: ABDOMEN - 1 VIEW COMPARISON:  None. FINDINGS: IVC  filter is in place. Prior cholecystectomy. Nonobstructive bowel gas pattern. No  free air organomegaly. No acute bony abnormality. IMPRESSION: IVC filter in place. No acute abnormality. Electronically Signed   By: Rolm Baptise M.D.   On: 05/15/2016 13:04    Procedures Procedures (including critical care time)  Medications Ordered in ED Medications  oxyCODONE-acetaminophen (PERCOCET/ROXICET) 5-325 MG per tablet 1 tablet (1 tablet Oral Given 05/15/16 1146)  iopamidol (ISOVUE-370) 76 % injection (100 mLs  Contrast Given 05/15/16 1727)     Initial Impression / Assessment and Plan / ED Course  I have reviewed the triage vital signs and the nursing notes.  Pertinent labs & imaging results that were available during my care of the patient were reviewed by me and considered in my medical decision making (see chart for details).  Clinical Course    Patient status post pulmonary thrombectomy for saddle embolus at [facility] on July 21. Patient prior to that had an IVC filter placed. Patient is followed by the wellness Center here. Patient has had some increased chest discomfort along her incisions and some increased shortness of breath with exertion. Patient's vital signs here are been very stable. Labs without significant abnormalities.. Not concerned for an acute cardiac event. EKG had no significant abnormalities. Chest x-ray was normal. Patient's IVC filter on KUB is in the proper position.  Patient's chest pain is mild in nature and more chest wall in nature along the incisions.  CT angiogram of the chest was done just to further evaluate for any additional problems. That is negative patient can be discharged home with follow-up with her primary care doctor. Vital signs have remained normal. Oxygen saturations are normal. Patient nontoxic no acute distress.  Final Clinical Impressions(s) / ED Diagnoses   Final diagnoses:  Chest pain, unspecified chest pain type  SOB (shortness of breath)    New Prescriptions New Prescriptions   No medications on file     Fredia Sorrow,  MD 05/15/16 1759

## 2016-05-15 NOTE — ED Notes (Signed)
Port a cath card taken to CT for approval to have CTA.

## 2016-05-16 ENCOUNTER — Telehealth: Payer: Self-pay | Admitting: *Deleted

## 2016-05-16 ENCOUNTER — Encounter: Payer: Self-pay | Admitting: Internal Medicine

## 2016-05-16 ENCOUNTER — Ambulatory Visit: Payer: Self-pay

## 2016-05-16 ENCOUNTER — Ambulatory Visit: Payer: Commercial Managed Care - HMO | Attending: Internal Medicine | Admitting: Internal Medicine

## 2016-05-16 VITALS — BP 101/69 | Ht 65.0 in | Wt 278.0 lb

## 2016-05-16 DIAGNOSIS — Z7982 Long term (current) use of aspirin: Secondary | ICD-10-CM | POA: Insufficient documentation

## 2016-05-16 DIAGNOSIS — I2699 Other pulmonary embolism without acute cor pulmonale: Secondary | ICD-10-CM | POA: Insufficient documentation

## 2016-05-16 DIAGNOSIS — I2692 Saddle embolus of pulmonary artery without acute cor pulmonale: Secondary | ICD-10-CM

## 2016-05-16 DIAGNOSIS — L989 Disorder of the skin and subcutaneous tissue, unspecified: Secondary | ICD-10-CM | POA: Insufficient documentation

## 2016-05-16 DIAGNOSIS — I2602 Saddle embolus of pulmonary artery with acute cor pulmonale: Secondary | ICD-10-CM

## 2016-05-16 DIAGNOSIS — Z8701 Personal history of pneumonia (recurrent): Secondary | ICD-10-CM | POA: Diagnosis not present

## 2016-05-16 DIAGNOSIS — Z23 Encounter for immunization: Secondary | ICD-10-CM

## 2016-05-16 DIAGNOSIS — Z6841 Body Mass Index (BMI) 40.0 and over, adult: Secondary | ICD-10-CM | POA: Insufficient documentation

## 2016-05-16 DIAGNOSIS — I2782 Chronic pulmonary embolism: Secondary | ICD-10-CM

## 2016-05-16 DIAGNOSIS — Z1159 Encounter for screening for other viral diseases: Secondary | ICD-10-CM

## 2016-05-16 DIAGNOSIS — Z7901 Long term (current) use of anticoagulants: Secondary | ICD-10-CM | POA: Insufficient documentation

## 2016-05-16 DIAGNOSIS — M1990 Unspecified osteoarthritis, unspecified site: Secondary | ICD-10-CM | POA: Diagnosis not present

## 2016-05-16 DIAGNOSIS — I10 Essential (primary) hypertension: Secondary | ICD-10-CM

## 2016-05-16 MED FILL — TRIAMCINOLONE 0.1% CREAM: 0.1 | 15 days supply | Qty: 30 | Fill #0

## 2016-05-16 MED FILL — AMLODIPINE BESYLATE 5 MG TA: 5 | 30 days supply | Qty: 30 | Fill #3

## 2016-05-16 NOTE — Progress Notes (Signed)
[name] [name], is a 55 y.o. female  [id number]  [id number]  DOB - 12-09-1960  Chief Complaint  Patient presents with  . Hypertension        Subjective:   [name] [name] is a 55 y.o. female here today for a follow up visit from ED visit yesterday. Per pt, c/o of constant CP started Thursday, no relief. She called her CTS at [facility], who recd eval in Ed.  Pt finally went to ED yesterday given chest pain not resolved, noted stable after extensive testing and sent home. Per pt, since receiving percocets in Ed, her pain has improved greatly  C/o of arthritis of hands, right >left, and chronic knee pains ("bone on bone") which appears to be worsening. Now that has had thrombectomy, would like referral to pain specialist.  She also requested derm eval for the rash on her thigh. Prior was trx for shingles, but now rash appears to be extending over her skin graft on her upper thigh.  Patient has No headache, No chest pain currently, No abdominal pain - No Nausea, No new weakness tingling or numbness, No Cough - SOB.  No problems updated.  ALLERGIES: No Known Allergies  PAST MEDICAL HISTORY: Past Medical History:  Diagnosis Date  . Arthritis   . Diverticulitis   . Hypertension   . Obesity   . Pneumonia   . Pulmonary embolism ([place])     MEDICATIONS AT HOME: Prior to Admission medications   Medication Sig Start Date End Date Taking? Authorizing Provider  acetaminophen (TYLENOL) 500 MG tablet Take 500 mg by mouth every 6 (six) hours as needed for mild pain.    Historical Provider, MD  albuterol (PROVENTIL HFA;VENTOLIN HFA) 108 (90 Base) MCG/ACT inhaler Inhale 1-2 puffs into the lungs every 6 (six) hours as needed for wheezing or shortness of breath. 02/08/16   Maryan Puls, MD  ALPRAZolam Duanne Moron) 1 MG tablet Take 1 mg by mouth 2 (two) times daily as needed for anxiety or sleep.    Historical Provider, MD  amLODipine (NORVASC) 5 MG tablet Take 1 tablet (5 mg total) by mouth  daily. 04/30/16   Maren Reamer, MD  aspirin 81 MG chewable tablet Chew 81 mg by mouth daily.    Historical Provider, MD  budesonide-formoterol (SYMBICORT) 160-4.5 MCG/ACT inhaler Inhale 2 puffs into the lungs 2 (two) times daily.    Historical Provider, MD  citalopram (CELEXA) 40 MG tablet Take 1 tablet (40 mg total) by mouth daily. Reported on 02/20/2016 Patient taking differently: Take 20 mg by mouth daily. Reported on 02/20/2016 04/30/16   Maren Reamer, MD  docusate sodium (COLACE) 100 MG capsule Take 100 mg by mouth daily as needed for mild constipation.    Historical Provider, MD  furosemide (LASIX) 40 MG tablet Take 1 tablet (40 mg total) by mouth daily. 05/07/16   Maren Reamer, MD  gabapentin (NEURONTIN) 100 MG capsule Take 1 capsule (100 mg total) by mouth every 12 (twelve) hours as needed. Patient taking differently: Take 100 mg by mouth every 12 (twelve) hours as needed (nerve pain).  04/30/16   Maren Reamer, MD  Melatonin 3 MG TABS Take 1 tablet (3 mg total) by mouth at bedtime. Patient taking differently: Take 5 mg by mouth at bedtime.  03/06/16   Maren Reamer, MD  oxyCODONE (OXY IR/ROXICODONE) 5 MG immediate release tablet Take 5 mg by mouth every 4 (four) hours as needed for severe pain.    Historical Provider, MD  potassium chloride (K-DUR) 10 MEQ tablet Take 1 tablet (10 mEq total) by mouth 2 (two) times daily. 04/24/16   Lisabeth Pick, MD  rivaroxaban (XARELTO) 20 MG TABS tablet Take 1 tablet (20 mg total) by mouth daily with supper. 02/20/16   Maren Reamer, MD  triamcinolone cream (KENALOG) 0.1 % Apply 1 application topically 2 (two) times daily. For rash on left leg 05/15/16   Daleen Bo, MD  zaleplon (SONATA) 5 MG capsule Take 1 capsule (5 mg total) by mouth at bedtime as needed for sleep. 04/30/16   Maren Reamer, MD     Objective:   Vitals:   05/16/16 1215  BP: 101/69  Weight: 278 lb (126.1 kg)  Height: 5\' 5"  (1.651 m)    Exam General appearance :  Awake, alert, not in any distress. Speech Clear. Not toxic looking, morbid obese. Speaking full sentences. HEENT: Atraumatic and Normocephalic, pupils equally reactive to light. Neck: supple, no JVD. No cervical lymphadenopathy.  Chest:Good air entry bilaterally, no added sounds. CVS: S1 S2 regular, no murmurs/gallups or rubs. Abdomen: Bowel sounds active, Non tender and not distended with no gaurding, rigidity or rebound. Extremities: B/L Lower Ext shows no edema, both legs are warm to touch No nodules noted on hands, nttp bilat knees and hand on palpation. Neurology: Awake alert, and oriented X 3, CN II-XII grossly intact, Non focal Skin: nonspecific scaling rash w/ some areas with 1-2 mm red papules noted around upper left thigh (was prior trx empirically for shingles), but now rash appears to be extending to the skin grafts (which is on the outer left upper thigh region)  Data Review No results found for: HGBA1C  Depression screen [facility] 2/9 04/25/2016 03/06/2016 02/20/2016 01/31/2016 11/20/2015  Decreased Interest 3 3 2 2  0  Down, Depressed, Hopeless 2 3 1 2  0  PHQ - 2 Score 5 6 3 4  0  Altered sleeping 3 3 3 3  -  Tired, decreased energy 3 3 2 2  -  Change in appetite 1 2 2 2  -  Feeling bad or failure about yourself  1 2 1 1  -  Trouble concentrating 2 3 1 1  -  Moving slowly or fidgety/restless 0 3 0 0 -  Suicidal thoughts 0 0 0 0 -  PHQ-9 Score 15 22 12 13  -  Difficult doing work/chores - - Somewhat difficult Somewhat difficult -     05/15/16 ct angio  IMPRESSION: Persistent but partially recanalized pulmonary embolus within the right lower lobe pulmonary artery. No new pulmonary embolism is identified.  Minimal bibasilar atelectatic changes stable from the prior exam.   9/67/17 abd xray CLINICAL DATA:  Abdominal pain.  EXAM: ABDOMEN - 1 VIEW  COMPARISON:  None.  FINDINGS: IVC filter is in place. Prior cholecystectomy. Nonobstructive bowel gas pattern. No free air  organomegaly. No acute bony abnormality.  IMPRESSION: IVC filter in place.  No acute abnormality.   Electronically Signed   By: Rolm Baptise M.D.   On: 05/15/2016 13:04  05/15/16 cxr IMPRESSION: Lower lung zone atelectasis bilaterally. No edema or consolidation. No pneumothorax. Stable cardiac silhouette.   Electronically Signed   By: Lowella Grip III M.D.   On: 05/15/2016 11:09 Assessment & Plan   1. htn - last seen in clinic malignant, now normal, suspect due to anxiety. - no changes on bp meds  2. Arthritis, suspect djd, given signif obesity Wk up in 5/17 neg for autoimmune dx, but RF not done.   - Rheumatoid  factor - Ambulatory referral to Pain Clinic, may benefit from injections.  3. Chronic pe, pulm endarterectomy w/ cardiopulm bypass by Dr Marney Setting pm 03/29/16 - seen in ed yesterday for cp,  With CT angio showing: Persistent but partially recanalized pulmonary embolus within the right lower lobe pulmonary artery. No new pulmonary embolism is identified. - suspect msk pain, improved w/ percocets.  4. Skin lesion of left leg - hx of possible shingles infection there recently, fully treated., also emp treated for fungal infection infection w/ clotrimazole cream. No improvement, and appears to be extending to her skin graft region, started on triamcinolone cream by ED yesterday. - Ambulatory referral to Dermatology   5. tdap today  6. Need for hepatitis C screening test - Hepatitis C antibody  7. Morbid obesity  Patient have been counseled extensively about nutrition and exercise  Return in about 2 months (around 07/16/2016).  The patient was given clear instructions to go to ER or return to medical center if symptoms don't improve, worsen or new problems develop. The patient verbalized understanding. The patient was told to call to get lab results if they haven't heard anything in the next week.   This note has been created with Biomedical engineer. Any transcriptional errors are unintentional.   Maren Reamer, MD, [facility] [place], [place]   05/16/2016, 12:23 PM

## 2016-05-16 NOTE — Progress Notes (Signed)
Patient is here for BP FU  Patient complains of intermittent chest pain being present.  Patient has not taken medication today and patient has not eaten today.  Patient tolerated the tdap injection well today.

## 2016-05-16 NOTE — Telephone Encounter (Signed)
Patient verified DOB Patient was seen in the office today and needed to be scheduled for a lab visit. Patient was scheduled for tomorrow at 3pm. No further questions at this time.

## 2016-05-17 ENCOUNTER — Other Ambulatory Visit: Payer: Commercial Managed Care - HMO

## 2016-05-17 ENCOUNTER — Other Ambulatory Visit: Payer: Self-pay

## 2016-05-17 NOTE — Patient Outreach (Addendum)
[facility]) Care Management  05/17/2016  Tana Myers February 21, 1961 [id number]  Telephonic Screening   Referral Date:  05/10/16 Source:  [facility] Care Management Issue:  "Recently admitted to [facility] 7/13-8/1 (19 days) for CTEPH S/P Pulmonary embolectomy.  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  She weighs around 317 and would like a health coach to work with her on her chronic conditions, wt loss and BP control.  She will complete TOC program through [facility] next week." Insurance:  [employer] Medicare HMO (Gold Plus).   Subjective: Patient reached for Screening.   States had Bypass surgery [id number] with complications from blood clots.  Xarelto daily and not missing any dosages.   Providers: Primary MD:  Dr. Maren Reamer -  last appt: 05/16/16 and next appt:  06/2016  Pulmonologist:  06/2016 Pain Clinic:  Referral in process as of 05/16/16 Optometrist: 04/2015 HH: none  Social: Patient lives in her home with husband and grandson 54 yo.  Moved to [place], [place] from [place] 10/2015 due to healthcare needs and son requested patient to move closer to him.   Patient has a 54 yo son   Mobility: Ambulating with cane or Rolator as needed.  Falls: 1 (fell in the [facility] 02/2016) due to bending over and lost balance due to dizziness.  Depression: yes and on medication for management.  H/o daughter died in patients arms as an 2 day old infant and still grieves.  Patient emotional regarding her health and fears that she may die.  Transportation: husband or son Can't afford Taxi's all the time.  Caregiver: husband Emergency Contact: husband Advance Directive: Obtained forms 05/16/16 from MD office appt on 05/16/2016.  Patient needs clarification to questions, directions and completion.  Patient agreed to [facility] SW Referral.  Consent:   yes Resources:  -[employer] Well Dine 10 dinners received.  DME: cane, Rolator,3n1, Reacher, CPAP, O2 Sat, BP Cuff, eyeglasses (son planning to get scales today for patient).   Co-morbidities:  Pulmonary embolus, OSA on CPAP, Depression, insomnia, vertigo, chronic pain syndrome Admission:  1   03/21/16-04/09/16 (19 day admission / [facility]) for CTEPH S/P Pulmonary embolectomy.   ED visits: 4 - last ED visit 05/15/16 - chest pain.  Patient has completed primary MD follow-up visit and was advised that patient still has one blood clot in the upper right lung.  BP 101/69 - states BP has been going up and down.  Weight 278  Height 65  BMI 46.4  OSA Patient states she has both OSA and insomnia.  CPAP originally came from [facility] while living there; thinks from  [facility]) who is the same provider for this location in [place].  Provider has advised patient she will need to need to pay $45.00 before provider will start sending supplies.   Patient states she needs Mask and filters and is not able to afford the $45.00 co-pay.  States she is not wearing even though she knows and understands she should be and this creates more stress on her heart.   Chronic Pain Syndrome Post Surgical pain in the chest, shoulders, stomach since surgery 03/2016.  Rates currently a 7/10.  Pain regime in place but states not working.  C/O additional pain the all joints.   Pain discussed with Dr. Janne Napoleon on 05/16/16 office appointment and MD making Pain Management Referral.   Medications:  Patient taking less than 15 medications  Co-pay cost issues: none -Xarelto co-pays vary from $10-$40 Flu Vaccine 04/30/2016 Medication Reconciliation completed with patient on 05/17/16   Encounter Medications:  Outpatient Encounter Prescriptions as of 05/17/2016  Medication Sig  . acetaminophen (TYLENOL) 500 MG tablet Take 500 mg by mouth every 6 (six) hours as needed for mild pain.  Marland Kitchen albuterol (PROVENTIL HFA;VENTOLIN HFA)  108 (90 Base) MCG/ACT inhaler Inhale 1-2 puffs into the lungs every 6 (six) hours as needed for wheezing or shortness of breath.  . ALPRAZolam (XANAX) 1 MG tablet Take 1 mg by mouth 2 (two) times daily as needed for anxiety or sleep.  Marland Kitchen amLODipine (NORVASC) 5 MG tablet Take 1 tablet (5 mg total) by mouth daily.  Marland Kitchen aspirin 81 MG chewable tablet Chew 81 mg by mouth daily.  . budesonide-formoterol (SYMBICORT) 160-4.5 MCG/ACT inhaler Inhale 2 puffs into the lungs 2 (two) times daily.  . citalopram (CELEXA) 40 MG tablet Take 1 tablet (40 mg total) by mouth daily. Reported on 02/20/2016 (Patient taking differently: Take 20 mg by mouth daily. Reported on 02/20/2016)  . docusate sodium (COLACE) 100 MG capsule Take 100 mg by mouth daily as needed for mild constipation.  . furosemide (LASIX) 40 MG tablet Take 1 tablet (40 mg total) by mouth daily.  Marland Kitchen gabapentin (NEURONTIN) 100 MG capsule Take 1 capsule (100 mg total) by mouth every 12 (twelve) hours as needed. (Patient taking differently: Take 100 mg by mouth every 12 (twelve) hours as needed (nerve pain). )  . Melatonin 3 MG TABS Take 1 tablet (3 mg total) by mouth at bedtime. (Patient taking differently: Take 5 mg by mouth at bedtime. )  . oxyCODONE (OXY IR/ROXICODONE) 5 MG immediate release tablet Take 5 mg by mouth every 4 (four) hours as needed for severe pain.  . potassium chloride (K-DUR) 10 MEQ tablet Take 1 tablet (10 mEq total) by mouth 2 (two) times daily.  . rivaroxaban (XARELTO) 20 MG TABS tablet Take 1 tablet (20 mg total) by mouth daily with supper.  . triamcinolone cream (KENALOG) 0.1 % Apply 1 application topically 2 (two) times daily. For rash on left leg  . zaleplon (SONATA) 5 MG capsule Take 1 capsule (5 mg total) by mouth at bedtime as needed for sleep.   No facility-administered encounter medications on file as of 05/17/2016.     Functional Status:  In your present state of health, do you have any difficulty performing the following  activities: 05/17/2016  Hearing? N  Vision? N  Difficulty concentrating or making decisions? Y  Walking or climbing stairs? Y  Dressing or bathing? N  Doing errands, shopping? Y  Preparing Food and eating ? N  Using the Toilet? N  In the past six months, have you accidently leaked urine? N  Do you have problems with loss of bowel control? N  Managing your Medications? N  Managing your Finances? N  Housekeeping or managing your Housekeeping? Y  Some recent data might be hidden    Fall/Depression Screening: PHQ 2/9 Scores 05/17/2016 05/16/2016 04/25/2016 03/06/2016 02/20/2016 01/31/2016 11/20/2015  PHQ - 2 Score 4 1 5 6 3 4  0  PHQ- 9 Score 14 8 15 22 12 13  -    Fall Risk  05/17/2016 05/16/2016 03/06/2016 11/20/2015 11/15/2015  Falls in the past year? Yes No Yes No No  Number falls in past yr: 1 - 1 - -  Injury with Fall? No - - - -  Risk Factor  Category  High Fall Risk - - - -  Risk for fall due to : History of fall(s);Other (Comment);Impaired balance/gait - - - -  Risk for fall due to (comments): dizziness, loss of balance - - - -  Follow up Falls evaluation completed;Education provided;Falls prevention discussed - - - -    Preventives: Hearing: 04/2015 no testing and no problems.   Eyes:  05/2015 - Due now for yearly.  Dentist: 07/2015 Podiatrist: no Mammogram: 2017 Bone Density: none Colonoscopy:  09/09/2014 Smoker:  No  Assessment/Plan:  Referral Date:  05/10/16 Screening:  05/17/16  [facility] Community RN CM Referral  -Hospital admission 03/21/16-04/09/16 (19 day admission / [facility]) for CTEPH S/P Pulmonary embolectomy.   -ED visit 05/15/2016 - Chest pain -Pain Management Referral pending -CPAP non-adherence associated to Financial Barriers. -Advance Directives pending -No scales in the home: son plans to purchase.   [facility] Social Work Referral -Financial Assessment:  Patient unable to afford $45.00 to obtain CPAP supplies. -[place]  resources -Depression   RN CM advised patient in next [facility] scheduled contact call within the next 10 business days.   RN CM advised to please notify MD of any changes in condition prior to scheduled appt's.   RN CM provided contact name and # [phone] or main office # [phone] and 24-hour nurse line # [phone].  RN CM confirmed patient is aware of 911 services for urgent emergency needs.    Nathaneil Canary, BSN, RN, [place] Care Management Care Management Coordinator [phone] Direct [phone] Cell [phone] Office [phone] Fax [email or web address]

## 2016-05-20 ENCOUNTER — Encounter: Payer: Self-pay | Admitting: *Deleted

## 2016-05-20 ENCOUNTER — Other Ambulatory Visit: Payer: Commercial Managed Care - HMO

## 2016-05-24 ENCOUNTER — Other Ambulatory Visit: Payer: Self-pay | Admitting: *Deleted

## 2016-05-24 ENCOUNTER — Encounter: Payer: Self-pay | Admitting: *Deleted

## 2016-05-24 NOTE — Patient Outreach (Signed)
[place] [facility]) Care Management  05/24/2016  [name] [name] [birth year] [id number]   CSW was able to make initial contact with patient today to perform phone assessment, as well as assess and assist with social work needs and services.  CSW introduced self, explained role and types of services provided through [place] [facility] ([place] [facility]).  CSW further explained to patient that CSW works with patient's RNCM, also with [place] Management, Tish Men. CSW then explained the reason for the call, indicating that Mrs. Satterfield thought that patient would benefit from social work services and resources to assist with referrals to various community agencies and resources.  CSW obtained two HIPAA compliant identifiers from patient, which included patient's name and date of birth. CSW spoke with patient at length about transportation resources, both through [employer], as well as transportation through [employer] with [employer] of [facility], agreeing to make a referral for patient to both agencies.  CSW will also mail patient a packet of resource information, including all of the above information, in addition to an application for [employer] geologist) through the Department of Transportation.  CSW then spoke with patient about completing her Advanced Directives (both [place] documents).  CSW will include an Advanced Directives packet in the list of resource information that CSW will be mailing to patient's home.  Once patient has had an opportunity to review the Advanced Directives, CSW agreed to assist with completion, if necessary. Patient admits to experiencing symptoms of Depression; however, indicated that she is currently taking psychotropic medications and undergoing treatment for counseling and supportive services, not needing a referral or resource  information from [place].  Last, patient would like to receive a list of Psychologist, sport and exercise.  A list will also be included in patient's packet of resource information.  CSW agreed to follow-up with patient in one week to ensure that she received the resource packet, as well as answer any questions that she may have during that time.  Patient was most appreciative of the call.  Nat Christen, BSW, MSW, LCSW  Licensed Education officer, environmental Health System  Mailing [place] [address], [place], [place] [address] Physical Address-[address], [place], [place] [address] Toll Free Main # [phone] Fax # [phone] Cell # [phone]  Fax # [phone]  [email or web address]

## 2016-05-27 MED FILL — POTASSIUM CL 10 MEQ TAB SA: 10 | 30 days supply | Qty: 60 | Fill #1 | Status: TO

## 2016-05-31 ENCOUNTER — Other Ambulatory Visit: Payer: Self-pay

## 2016-05-31 NOTE — Patient Outreach (Signed)
[place] [facility]) Care Management  05/31/16  [name] [name] [birth year] [id number]  Successful outreach completed with patient. Patient identification verified.  Referral received for community RN to assess for needs. Initial referral from [facility] for: "Recently admitted to [facility] 7/13-8/1 (19 days) for CTEPH S/P Pulmonary embolectomy. 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 She weighs around 317 and would like a health coach to work with her on her chronic conditions, wt loss and BP control. She will complete TOC program through [facility] next week."  Patient stated that she is not feeling well today. Stated she just has not felt good since her surgery at [facility]. Patient reports that she did finish her transition of care with her [employer] case manager last week. She reports today that her blood pressure was 160/99 and she was instructed to take a blood pressure pill. Stated that she was given them to take as needed. She stated that while she was in the hospital, her blood pressure stayed so low that they took her off all of her blood pressure medications. Stated that it stayed low for about 1 1/2 to 2 weeks and then Dr. Janne Napoleon put her back on it to take as needed.   Patient stated that she has been going through a lot and she has never been through anything like this before. Patient is overwhelmed with recent hospitalization and pain. Stated that she was in the emergency department last week because of a new sensation of squeezing pain in her chest "like wringing out a towel." She reports that she was in the ED for over 12 hours and had multiple tests and xrays done. She stated that they originally could not find her filter and that caused her to panic. She stated that they finally did find it on a CT scan. Patient expresses a lot of  anxiety about recent ED visit.    She is very frustrated because she continues to feel the same way she did when she went into the ED but does not have a clear answer why. She reports that "I know I am not well." She expressed frustration with not having anyone to talk to and that she is trying to be strong.   Patient reports that her husband is very supportive and has been a blessing. Stated that he has been helping her 24 hours per day and their son has also been a big help. She reports that they moved to this area and really don't know anyone and that is challenging because their support system is very small. RNCM assessed if patient would like to talk with a counselor and patient stated that she has done this in the past and she does not want to do that right now. She stated that she knows it is an option and is grateful. Also stated that if or when she feels it is necessary, she would talk with someone.   Patient is very receptive to [place] Management and is agreeable for a home visit.  RNCM contact information and 24 hour nurse line information provided.  Home visit scheduled for next week.   [name] R. [name] Habeck, RN, BSN, [place] Management Coordinator [phone]

## 2016-06-04 ENCOUNTER — Other Ambulatory Visit: Payer: Self-pay

## 2016-06-04 ENCOUNTER — Other Ambulatory Visit: Payer: Self-pay | Admitting: *Deleted

## 2016-06-04 NOTE — Patient Outreach (Signed)
[facility] [facility]) Care Management  [place]  06/04/2016   [name] [name] 05/18/1961 [id number]  Subjective: Initial home visit completed with patient.  Objective:  Vitals:   06/04/16 1058  BP: (P) 100/80  Pulse: (P) 69  Resp: (P) 18    Encounter Medications:  Outpatient Encounter Prescriptions as of 06/04/2016  Medication Sig Note  . acetaminophen (TYLENOL) 500 MG tablet Take 500 mg by mouth every 6 (six) hours as needed for mild pain.   Marland Kitchen albuterol (PROVENTIL HFA;VENTOLIN HFA) 108 (90 Base) MCG/ACT inhaler Inhale 1-2 puffs into the lungs every 6 (six) hours as needed for wheezing or shortness of breath.   . ALPRAZolam (XANAX) 1 MG tablet Take 1 mg by mouth 2 (two) times daily as needed for anxiety or sleep.   Marland Kitchen amLODipine (NORVASC) 5 MG tablet Take 1 tablet (5 mg total) by mouth daily. 06/04/2016: Patient is taking differently; takes as needed for high blood pressure  . aspirin 81 MG chewable tablet Chew 81 mg by mouth daily.   . budesonide-formoterol (SYMBICORT) 160-4.5 MCG/ACT inhaler Inhale 2 puffs into the lungs 2 (two) times daily.   . citalopram (CELEXA) 40 MG tablet Take 1 tablet (40 mg total) by mouth daily. Reported on 02/20/2016 (Patient taking differently: Take 20 mg by mouth daily. Reported on 02/20/2016)   . docusate sodium (COLACE) 100 MG capsule Take 100 mg by mouth daily as needed for mild constipation.   . furosemide (LASIX) 40 MG tablet Take 1 tablet (40 mg total) by mouth daily. 06/04/2016: Patient taking differently; takes 2 tablets by mouth daily  . gabapentin (NEURONTIN) 100 MG capsule Take 1 capsule (100 mg total) by mouth every 12 (twelve) hours as needed. (Patient taking differently: Take 100 mg by mouth every 12 (twelve) hours as needed (nerve pain). )   . Melatonin 3 MG TABS Take 1 tablet (3 mg total) by mouth at bedtime. (Patient taking differently: Take 5 mg by mouth at bedtime. )   . potassium chloride (K-DUR) 10 MEQ tablet Take 1 tablet  (10 mEq total) by mouth 2 (two) times daily.   . rivaroxaban (XARELTO) 20 MG TABS tablet Take 1 tablet (20 mg total) by mouth daily with supper.   . triamcinolone cream (KENALOG) 0.1 % Apply 1 application topically 2 (two) times daily. For rash on left leg   . zaleplon (SONATA) 5 MG capsule Take 1 capsule (5 mg total) by mouth at bedtime as needed for sleep.   Marland Kitchen oxyCODONE (OXY IR/ROXICODONE) 5 MG immediate release tablet Take 5 mg by mouth every 4 (four) hours as needed for severe pain.    No facility-administered encounter medications on file as of 06/04/2016.     Functional Status:  In your present state of health, do you have any difficulty performing the following activities: 05/24/2016 05/17/2016  Hearing? N N  Vision? N N  Difficulty concentrating or making decisions? [name]  Walking or climbing stairs? Y Y  Dressing or bathing? Y N  Doing errands, shopping? [name]  Preparing Food and eating ? N N  Using the Toilet? N N  In the past six months, have you accidently leaked urine? N N  Do you have problems with loss of bowel control? N N  Managing your Medications? N N  Managing your Finances? Y N  Housekeeping or managing your Housekeeping? [name]  Some recent data might be hidden    Fall/Depression Screening: PHQ 2/9 Scores 05/24/2016 05/17/2016 05/16/2016 04/25/2016  03/06/2016 02/20/2016 01/31/2016  PHQ - 2 Score 4 4 1 5 6 3 4   PHQ- 9 Score 15 14 8 15 22 12 13     Assessment: Patient expresses a lot of anxiety today. Stated that her primary support is her husband and he is currently in the hospital. She reports that her son has been by to check on her, but she will not go to the hospital unless she needs to be there. She is very concerned about his health and focused on talking about him. RNCM offered emotional support and again reminded patient that she has been through a major surgery and has a lot going on. Reminded her of counseling or talking to someone and she remains reluctant. She does state  that she has a Social worker and will reach out to them if she feels she needs to. Patient is ambulating well with her walker and reports she is just taking her time. She continues to complain of pain in her chest and stated she feels it is more related to surgical pain. Rates pain 7/10 currently. Assessment of medications completed and patient is taking all medications as prescribed. She stated that she gets very little pain relief from her medications. Patient stated that while she was in the emergency department, she was given percocet and it helped relatively quickly. She stated that she has been wondering if she is on a long-acting pain medication and that is why it does not help. Educated patient that she is on Oxy IR and educated that the IR stands for immediate release. Patient stated she did not understand because she her it does not work very well.   Patient currently does not have any other concerns she would like to discuss at home visit. She stated that she has had very little sleep (less than 2-3 hours in last 24 hours) and she wants to rest until her son calls with an update about her husband's admission.    Plan: RNCM will follow up in next couple of weeks for continued support and to complete assessments. RNCM provided contact information and contact information for 24 hour nurse line and encouraged to call with any questions or concerns.  [name], RN, BSN, [place] Management Coordinator [phone]

## 2016-06-04 NOTE — Patient Outreach (Signed)
[place] [facility]) Care Management  06/04/2016  [name] [name] 02-03-1961 [id number]  CSW was able to make contact with patient today to follow-up regarding social work services and resources.  CSW also wanted to confirm that patient received the packet of resource information that CSW mailed to patient's home.  Patient admits that she has not received the packet of resource information, but that her mail is often slow getting to her.  CSW kept the conversation brief, after patient told CSW that patient's RNCM, Tish Men, also with [facility] Care Management, was present and trying to perform a routine home visit with her.  Patient mentioned that her husband was recently hospitalized so she is having a difficult time adjusting to him not being there to assist her with activities of daily living.  Patient is optimistic that he will be able to return home soon and that he will make a full recovery.  CSW agreed to follow-up with patient in one week to ensure that patient received the packet of resource information.  Patient voiced understanding and was agreeable to this plan. Nat Christen, BSW, MSW, LCSW  Licensed Education officer, environmental Health System  Mailing [place] [address] [place], [place] [address] Physical Address-[address], [place], [place] [address] Toll Free Main # [phone] Fax # [phone] Cell # [phone]  Fax # [phone]  [email or web address]

## 2016-06-10 MED FILL — GABAPENTIN 100 MG CAPSULE: 100 | 45 days supply | Qty: 90 | Fill #1

## 2016-06-11 ENCOUNTER — Encounter: Payer: Self-pay | Admitting: Pulmonary Disease

## 2016-06-11 ENCOUNTER — Other Ambulatory Visit: Payer: Self-pay | Admitting: *Deleted

## 2016-06-11 ENCOUNTER — Ambulatory Visit (INDEPENDENT_AMBULATORY_CARE_PROVIDER_SITE_OTHER): Payer: Commercial Managed Care - HMO | Admitting: Pulmonary Disease

## 2016-06-11 VITALS — BP 104/62 | HR 63 | Ht 63.0 in | Wt 309.2 lb

## 2016-06-11 DIAGNOSIS — R0609 Other forms of dyspnea: Secondary | ICD-10-CM

## 2016-06-11 DIAGNOSIS — G4733 Obstructive sleep apnea (adult) (pediatric): Secondary | ICD-10-CM | POA: Diagnosis not present

## 2016-06-11 DIAGNOSIS — I272 Pulmonary hypertension, unspecified: Secondary | ICD-10-CM

## 2016-06-11 NOTE — Progress Notes (Signed)
[name] [name]    [id number]    07-Jan-1961  Primary Care Physician:Dawn Lazarus Gowda, MD  Referring Physician: Maren Reamer, MD [address] 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 is a 55 year old with past medical history of morbid obesity, obstructive sleep apnea, hypertension. She was admitted in January 2017 at [place], [place] for saddle embolus. She was initially on heparin anticoagulation and transition to Xarelto. Since discharge has been complaining of dyspnea, cough with white sputum production, episodes of dizziness, right chest pain. She was reevaluated with a CT scan which showed persistent emboli and an echocardiogram which showed severe pulmonary hypertension. S/p thromboendarterectomy at [facility]. She reports that the anesthetists had a very hard time intubating her. It took a long time to secure the airway. Post procedure she seems to be stable. She was revaluated in ED for persistent chest pain in Sept 2017. She has a CTA done which shows partial recanalization of the thrombus. She has history of obstructive sleep apnea diagnosed in 2015. She had been maintained on CPAP. She was off therapy for a while but now back on it after replacement of her equipment.  She is under considerable stress at home she is taking care of her grandchild who has some issues withbreakdown. She appears quite tearful in the office today. She reports falling on her back yesterday after she lost her balance. She did not hit her head or lose consciousness.  Outpatient Encounter Prescriptions as of 06/11/2016  Medication Sig  . acetaminophen (TYLENOL) 500 MG tablet Take 500 mg by mouth every 6 (six) hours as needed for mild pain.  Marland Kitchen albuterol (PROVENTIL HFA;VENTOLIN HFA) 108 (90 Base) MCG/ACT inhaler Inhale 1-2 puffs into the lungs every 6  (six) hours as needed for wheezing or shortness of breath.  . ALPRAZolam (XANAX) 1 MG tablet Take 1 mg by mouth 2 (two) times daily as needed for anxiety or sleep.  Marland Kitchen amLODipine (NORVASC) 5 MG tablet Take 1 tablet (5 mg total) by mouth daily.  Marland Kitchen aspirin 81 MG chewable tablet Chew 81 mg by mouth daily.  Marland Kitchen atenolol (TENORMIN) 50 MG tablet Take 50 mg by mouth daily as needed.  . budesonide-formoterol (SYMBICORT) 160-4.5 MCG/ACT inhaler Inhale 2 puffs into the lungs 2 (two) times daily.  . citalopram (CELEXA) 40 MG tablet Take 1 tablet (40 mg total) by mouth daily. Reported on 02/20/2016 (Patient taking differently: Take 20 mg by mouth daily. Reported on 02/20/2016)  . docusate sodium (COLACE) 100 MG capsule Take 100 mg by mouth daily as needed for mild constipation.  . furosemide (LASIX) 40 MG tablet Take 1 tablet (40 mg total) by mouth daily.  Marland Kitchen gabapentin (NEURONTIN) 100 MG capsule Take 1 capsule (100 mg total) by mouth every 12 (twelve) hours as needed. (Patient taking differently: Take 100 mg by mouth every 12 (twelve) hours as needed (nerve pain). )  . Melatonin 3 MG TABS Take 1 tablet (3 mg total) by mouth at bedtime. (Patient taking differently: Take 5 mg by mouth at bedtime. )  . oxyCODONE (OXY IR/ROXICODONE) 5 MG immediate release tablet Take 5 mg by mouth every 4 (four) hours as needed for severe pain.  . potassium chloride (K-DUR) 10 MEQ tablet Take 1 tablet (10 mEq total) by mouth 2 (two) times daily.  . rivaroxaban (XARELTO) 20 MG  TABS tablet Take 1 tablet (20 mg total) by mouth daily with supper.  . triamcinolone cream (KENALOG) 0.1 % Apply 1 application topically 2 (two) times daily. For rash on left leg  . zaleplon (SONATA) 5 MG capsule Take 1 capsule (5 mg total) by mouth at bedtime as needed for sleep.   No facility-administered encounter medications on file as of 06/11/2016.     Allergies as of 06/11/2016  . (No Known Allergies)    Past Medical History:  Diagnosis Date  .  Arthritis   . Depression   . Diverticulitis   . Hypertension   . Obesity   . Pneumonia   . Pulmonary embolism [facility])     Past Surgical History:  Procedure Laterality Date  . ABDOMINAL HYSTERECTOMY    . BREAST REDUCTION SURGERY    . CHOLECYSTECTOMY    . EMBOLECTOMY N/A    pulmonary embolectomy    Family History  Problem Relation Age of Onset  . Lung cancer Maternal Grandmother     Social History   Social History  . Marital status: Married    Spouse name: N/A  . Number of children: N/A  . Years of education: N/A   Occupational History  . Not on file.   Social History Main Topics  . Smoking status: Never Smoker  . Smokeless tobacco: Never Used  . Alcohol use No  . Drug use: No  . Sexual activity: Not on file   Other Topics Concern  . Not on file   Social History Narrative  . No narrative on file     Review of systems: Review of Systems  Constitutional: Negative for fever and chills.  HENT: Negative.   Eyes: Negative for blurred vision.  Respiratory: as per HPI  Cardiovascular: Negative for chest pain and palpitations.  Gastrointestinal: Negative for vomiting, diarrhea, blood per rectum. Genitourinary: Negative for dysuria, urgency, frequency and hematuria.  Musculoskeletal: Negative for myalgias, back pain and joint pain.  Skin: Negative for itching and rash.  Neurological: Negative for dizziness, tremors, focal weakness, seizures and loss of consciousness.  Endo/Heme/Allergies: Negative for environmental allergies.  Psychiatric/Behavioral: Negative for depression, suicidal ideas and hallucinations.  All other systems reviewed and are negative.   Physical Exam: Blood pressure 104/62, pulse 63, height 5\' 3"  (1.6 m), weight (!) 309 lb 3.2 oz (140.3 kg), SpO2 98 %. Gen:      No acute distress HEENT:  EOMI, sclera anicteric Neck:     No masses; no thyromegaly Lungs:    Clear to auscultation bilaterally; normal respiratory effort CV:         Regular rate  and rhythm; no murmurs Abd:      + bowel sounds; soft, non-tender; no palpable masses, no distension Ext:    No edema; adequate peripheral perfusion Skin:      Warm and dry; no rash Neuro: alert and oriented x 3 Psych: normal mood and affect  Data Reviewed: Lung imaging CT scan 12/25/15 Right lower lobe and left lower lobe pulmonary embolism. Images reviewed  CT scan 02/08/16 Persistent RLL embolus  CT scan 05/15/16 Persistent but partially recanalized pulmonary embolus within the right lower lobe pulmonary artery. Images reviewed.  Echo 12/15/15  LVEF 55-60 percent. Normal cavity, wall thickness, systolic function. RV cavity size normal. Normal wall thickness and systolic function [id number]   Labs 01/10/16 HIV- Neg LFTs- WNL Jo-1, centromere, dsDNA, RNP, Ro, La, RA, Scl-70- All negative  Assessment:  #1 Chronic thromboembolic PHTN S/p thromboendarterectomy at [facility].  Clinically she is doing better with improved exercise capacity but the intermittent chest pain continues. She continues on anticoagulation and has follow up arranged at [facility]. Post procedure echo still shows severe RV systolic function. I will let [facility] decide if she needs to be on pulmonary vasodilators  #2 Severe OSA Compliant with CPAP  Plan/Recommendations: - Continue Xarelto. - CPAP. - Follow up at [facility].   [name] MD [facility] Pager [phone] 06/11/2016, 10:56 AM  CC: Maren Reamer, MD

## 2016-06-11 NOTE — Patient Outreach (Signed)
[place] [facility]) Care Management  06/11/2016  [name] [name] 1961/04/08 [id number]   CSW made an attempt to try and contact patient today to follow-up regarding social work services and resources, as well as to ensure that patient received the packet of resource information that CSW mailed to patient's home; however, patient was unavailable.  A HIPAA compliant message was left for patient on voicemail.  CSW is currently awaiting a return call.  CSW will make a second outreach attempt in one week, if CSW does not receive a return call from patient in the meantime. Nat Christen, BSW, MSW, LCSW  Licensed Education officer, environmental Health System  Mailing [place] [address] [place], [place] [address] Physical Address-[address], [place], [place] [address] Toll Free Main # [phone] Fax # [phone] Cell # [phone]  Fax # [phone]  [email or web address]

## 2016-06-14 ENCOUNTER — Other Ambulatory Visit: Payer: Self-pay

## 2016-06-14 NOTE — Patient Outreach (Signed)
[place] [facility] [facility]) Care Management  06/14/16  [name] [name] 03-15-1961 [id number]  Received voicemail from patient at 1:02 pm today requesting call back to discuss home visit for next week.  RNCM attempted to return call to patient and also to continue transition of care without success. Left HIPAA compliant voicemail with RNCM contact information and requested call back.  [name] Samara Stankowski, RN, BSN, [place] Management Coordinator [phone]

## 2016-06-17 MED FILL — XARELTO 20 MG TABLET: 20 | 30 days supply | Qty: 30 | Fill #4 | Status: TO

## 2016-06-19 ENCOUNTER — Other Ambulatory Visit: Payer: Self-pay | Admitting: *Deleted

## 2016-06-19 NOTE — Patient Outreach (Signed)
[place] [facility]) Care Management  06/19/2016  [name] [name] 09/04/1961 [id number]   CSW made a second attempt to try and contact patient today to perform phone assessment, as well as assess and assist with social work needs and services, without success.  A HIPAA compliant message was left on voicemail.  CSW will make a third attempt to outreach to patient within the next week, if CSW does not receive a return call in the meantime. Nat Christen, BSW, MSW, LCSW  Licensed Education officer, environmental Health System  Mailing [place] [address] [place], [place] [address] Physical Address-[address], [place], [place] [address] Toll Free Main # [phone] Fax # [phone] Cell # [phone]  Fax # [phone]  [email or web address]

## 2016-06-21 ENCOUNTER — Other Ambulatory Visit: Payer: Self-pay

## 2016-06-21 NOTE — Patient Outreach (Signed)
[place] [facility]) Care Management  06/21/16  [name] [name] 02-Jul-1961  [id number]   Successful outreach completed with patient. Patient identification verified.   Patient reported that she has had a lot of stress lately. Stated that her grandson has been in the hospital and she has not had a break since her hospitalization. Patient did confirm that she received the resources via mail from [place], SW with [facility] and currently does not have any questions.  She reports that her next appointment with her PCP is on Oct. 23. When RNCM attempted to assess patient's pain, patient stated that she tries not to even think or talk about it. She reports that she has been taking Tylenol for pain and has not been on any opioids. Stated that if her pain is "real bad," she will take one of her pain pills.  She continues to complain of surgical pain and states she is aware it will take a while to recover from her surgery. RNCM and patient discussed her depression and anxiety. Patient reports she knows she has depression and with all the hospitalizations lately between her and her family members, little support, and trying to recover from her surgery, she just has increased anxiety. She denies any suicidal ideations and is tearful on the phone while talking about her situation. She continued to report that her grandson has been having some psychological issues and seizures. RNCM encouraged patient to discuss this with her PCP or to get in touch with her psychologist regarding her increased anxiety and depression. Patient declines needs for counseling at present, stating she just needs things to settle down at home. She reports she is just overwhelmed. Since her surgery, her husband has been hospitalized and now her grandson.  She reports that she has not been getting a lot of sleep and reports issues with insomnia. She reports that she was up all last night and all today and was getting ready to take  a nap.  Patient does report a recent fall as well. She stated that she had unlocked her walker and then forgotten that she had done so. She stated that she fell as a result, but she was not injured and did not require going to the hospital to be seen.  Patient reports that she continues to have some shortness of breath with exertion, such as with bathing or dressing, but reports it is not like before when she had the blood clots. She stated she could barely walk around her home without getting extremely short of breath.   RNCM educated patient to call 911 if she experiences any chest pain or increased shortness of breath. Call ended so that patient could rest. Encouraged to call with any questions or concerns. Patient agreeable to outreach next week.  [name] Myrla Malanowski, RN, BSN, [place] Management Coordinator [phone]

## 2016-06-25 ENCOUNTER — Other Ambulatory Visit: Payer: Self-pay

## 2016-06-25 NOTE — Patient Outreach (Signed)
[facility] [facility]) Care Management  06/25/16  [name] [name] 1960-12-10 [id number]  Received voicemail from patient requesting callback. Successful outreach completed with patient. Patient identification verified.   Patient stated that she has been having right sided chest pain that is different from her surgical pain that she has had post-operatively. She stated that it is sharper and she also complains of worsening weakness and numbness in her right arm. Patient has had pain in her right arm since she was previously in the emergency room in 05/2016 and had her port accessed, but reports it feels "heavy" and she is having to use pillows to prop it up. RNCM had patient's husband, [name] [name] come into the room while RNCM was on the phone and went through the FAST acronym. Patient's husband reported that she had no facial drooping when she smiled, but that when she was not smiling, the left side of her face looked puffy. Patient had difficulty holding up her right arm and it was significantly lower than her left arm when she was holding them up. Patient does not have any slurred speech or difficulty understanding conversation. RNCM encouraged patient to call 911 and be evaluated in the emergency department, but patient stated that she really does not want to go. RNCM educated about the importance of being seen for her symptoms and they present an emergency. She stated that she eels that she will just be told that the chest pain is surgical and that she has been overwhelmed lately with everyone having close to her with health issues requiring ER visits and admissions.   At request of patient, RNCM will contact Dr. Lincoln Brigham.  [name] R. [name], RN, BSN, [place] Management Coordinator [phone]

## 2016-06-25 NOTE — Patient Outreach (Signed)
[place] [facility]) Care Management  06/25/16  [name] [name] Apr 27, 1961 [id number]  At patient's request, RNCM contacted her cardiothoracic surgeon, Dr. Lincoln Brigham and left a message with [name], who stated one of his clinical team would call back.  [name] [name], [name] with Dr.Haney returned call. [name] [name] of patient's symptoms and results of FAST test at home. Also advised of patient concerns regarding the numbness and weakness in her arm related to her port access in September. RNCM advised that she had instructed patient to go to the ED, but she is reluctant and requested RNCM contact Dr. Lincoln Brigham for advice. Per [name] [name], patient absolutely needs to be sent to the ED. RNCM will notify patient.  RNCM contacted patient and advised of recommendations from Dr. Etta Grandchild office. Patient told her husband what Dr. Etta Grandchild office recommended. RNCM educated about importance of following up ASAP regarding her symptoms. Patient still reluctant to go, but stated that she would go in the morning. RNCM encouraged to go on to the ED now, stressing that her symptoms could be related to her heart and could result in a heart attack or stroke. Educated patient regarding her increased risk. Patient stated she is aware, but needs to collect herself before going. She stated, "I am not going to say that I will go tonight, but I promise I will go in the morning." RNCM encouraged patient to call 911 if her symptoms because worse or she experienced any additional symptoms such as shortness of breath. Patient verbalized understanding and stated she would keep RNCM updated.   [name] San Lohmeyer, RN, BSN, [place] Management Coordinator [phone]

## 2016-06-26 ENCOUNTER — Ambulatory Visit: Payer: Commercial Managed Care - HMO | Admitting: *Deleted

## 2016-06-26 ENCOUNTER — Encounter (HOSPITAL_COMMUNITY): Payer: Self-pay | Admitting: Emergency Medicine

## 2016-06-26 ENCOUNTER — Emergency Department (HOSPITAL_COMMUNITY): Payer: Commercial Managed Care - HMO

## 2016-06-26 ENCOUNTER — Observation Stay (HOSPITAL_COMMUNITY)
Admission: EM | Admit: 2016-06-26 | Discharge: 2016-06-28 | Disposition: A | Discharge status: Home Health | Payer: Commercial Managed Care - HMO | Attending: Internal Medicine | Admitting: Internal Medicine

## 2016-06-26 ENCOUNTER — Other Ambulatory Visit: Payer: Self-pay | Admitting: *Deleted

## 2016-06-26 DIAGNOSIS — I4510 Unspecified right bundle-branch block: Secondary | ICD-10-CM | POA: Insufficient documentation

## 2016-06-26 DIAGNOSIS — R202 Paresthesia of skin: Secondary | ICD-10-CM

## 2016-06-26 DIAGNOSIS — E785 Hyperlipidemia, unspecified: Secondary | ICD-10-CM | POA: Diagnosis not present

## 2016-06-26 DIAGNOSIS — I639 Cerebral infarction, unspecified: Secondary | ICD-10-CM | POA: Diagnosis not present

## 2016-06-26 DIAGNOSIS — R0789 Other chest pain: Secondary | ICD-10-CM | POA: Diagnosis not present

## 2016-06-26 DIAGNOSIS — R079 Chest pain, unspecified: Secondary | ICD-10-CM | POA: Diagnosis not present

## 2016-06-26 DIAGNOSIS — Z7901 Long term (current) use of anticoagulants: Secondary | ICD-10-CM | POA: Diagnosis not present

## 2016-06-26 DIAGNOSIS — R51 Headache: Secondary | ICD-10-CM | POA: Insufficient documentation

## 2016-06-26 DIAGNOSIS — F329 Major depressive disorder, single episode, unspecified: Secondary | ICD-10-CM | POA: Insufficient documentation

## 2016-06-26 DIAGNOSIS — Z7982 Long term (current) use of aspirin: Secondary | ICD-10-CM | POA: Diagnosis not present

## 2016-06-26 DIAGNOSIS — M542 Cervicalgia: Secondary | ICD-10-CM

## 2016-06-26 DIAGNOSIS — Z86711 Personal history of pulmonary embolism: Secondary | ICD-10-CM | POA: Diagnosis not present

## 2016-06-26 DIAGNOSIS — I10 Essential (primary) hypertension: Secondary | ICD-10-CM | POA: Diagnosis not present

## 2016-06-26 DIAGNOSIS — G4733 Obstructive sleep apnea (adult) (pediatric): Secondary | ICD-10-CM | POA: Diagnosis not present

## 2016-06-26 DIAGNOSIS — R0602 Shortness of breath: Secondary | ICD-10-CM | POA: Diagnosis not present

## 2016-06-26 DIAGNOSIS — K5900 Constipation, unspecified: Secondary | ICD-10-CM | POA: Diagnosis not present

## 2016-06-26 DIAGNOSIS — R200 Anesthesia of skin: Secondary | ICD-10-CM | POA: Diagnosis not present

## 2016-06-26 DIAGNOSIS — Z9889 Other specified postprocedural states: Secondary | ICD-10-CM | POA: Insufficient documentation

## 2016-06-26 DIAGNOSIS — I2724 Chronic thromboembolic pulmonary hypertension: Secondary | ICD-10-CM | POA: Insufficient documentation

## 2016-06-26 DIAGNOSIS — Z801 Family history of malignant neoplasm of trachea, bronchus and lung: Secondary | ICD-10-CM | POA: Diagnosis not present

## 2016-06-26 DIAGNOSIS — G894 Chronic pain syndrome: Secondary | ICD-10-CM | POA: Diagnosis not present

## 2016-06-26 DIAGNOSIS — R531 Weakness: Secondary | ICD-10-CM

## 2016-06-26 DIAGNOSIS — Z6841 Body Mass Index (BMI) 40.0 and over, adult: Secondary | ICD-10-CM | POA: Diagnosis not present

## 2016-06-26 DIAGNOSIS — Z90710 Acquired absence of both cervix and uterus: Secondary | ICD-10-CM | POA: Diagnosis not present

## 2016-06-26 DIAGNOSIS — R071 Chest pain on breathing: Secondary | ICD-10-CM

## 2016-06-26 DIAGNOSIS — W19XXXA Unspecified fall, initial encounter: Secondary | ICD-10-CM | POA: Insufficient documentation

## 2016-06-26 DIAGNOSIS — M6281 Muscle weakness (generalized): Secondary | ICD-10-CM | POA: Diagnosis not present

## 2016-06-26 DIAGNOSIS — Z9989 Dependence on other enabling machines and devices: Secondary | ICD-10-CM | POA: Insufficient documentation

## 2016-06-26 DIAGNOSIS — M1990 Unspecified osteoarthritis, unspecified site: Secondary | ICD-10-CM | POA: Insufficient documentation

## 2016-06-26 DIAGNOSIS — Z951 Presence of aortocoronary bypass graft: Secondary | ICD-10-CM | POA: Diagnosis not present

## 2016-06-26 LAB — CBC
HCT: 35.5 % — ABNORMAL LOW (ref 36.0–46.0)
Hemoglobin: 11.5 g/dL — ABNORMAL LOW (ref 12.0–15.0)
MCH: 20.2 pg — ABNORMAL LOW (ref 26.0–34.0)
MCHC: 32.4 g/dL (ref 30.0–36.0)
MCV: 62.4 fL — ABNORMAL LOW (ref 78.0–100.0)
Platelets: 335 10*3/uL (ref 150–400)
RBC: 5.69 MIL/uL — ABNORMAL HIGH (ref 3.87–5.11)
RDW: 18.4 % — ABNORMAL HIGH (ref 11.5–15.5)
WBC: 6.8 10*3/uL (ref 4.0–10.5)

## 2016-06-26 LAB — BASIC METABOLIC PANEL
Anion gap: 10 (ref 5–15)
BUN: 12 mg/dL (ref 6–20)
CO2: 25 mmol/L (ref 22–32)
Calcium: 9.7 mg/dL (ref 8.9–10.3)
Chloride: 105 mmol/L (ref 101–111)
Creatinine, Ser: 0.88 mg/dL (ref 0.44–1.00)
GFR calc Af Amer: >60 mL/min (ref >60)
GFR calc non Af Amer: >60 mL/min (ref >60)
Glucose, Bld: 84 mg/dL (ref 65–99)
Potassium: 3.9 mmol/L (ref 3.5–5.1)
Sodium: 140 mmol/L (ref 135–145)

## 2016-06-26 LAB — I-STAT TROPONIN, ED: Troponin i, poc: 0.01 ng/mL (ref 0.00–0.08)

## 2016-06-26 NOTE — ED Provider Notes (Signed)
[facility] DEPT Provider Note   CSN: [id number] Arrival date & time: 06/26/16  1550     History   Chief Complaint Chief Complaint  Patient presents with  . Chest Pain  . Back Pain    HPI [name] [name] is a 55 y.o. female hx of CAD s/p CABG, pulmonary hypertension s/p embolectomy, Here presenting with right-sided numbness, weakness, chest pain. She states that she fell about a week ago onto her back. Since then she has intermittent right-sided chest pain. Patient States that over the last 3 days or so she had progressive right arm and leg numbness and right face numbness. Has some weakness on the right arm and leg as well. Was told to come to the ED yesterday but didn't want to. Has persistent right sided chest pain as well. States that she had chronic left sided chest pain after CABG but this is different. Denies shortness of breath.    The history is provided by the patient.    Past Medical History:  Diagnosis Date  . Arthritis   . Depression   . Diverticulitis   . Hypertension   . Obesity   . Pneumonia   . Pulmonary embolism [facility])     Patient Active Problem List   Diagnosis Date Noted  . Difficult airway 03/29/2016  . 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 OSA on CPAP 02/20/2016  . Chronic pain syndrome 01/31/2016  . Vaginal bleeding 11/20/2015  . Hematochezia 11/20/2015  . Pulmonary embolus ([place]) 11/15/2015  . Vertigo 11/15/2015  . Depression 11/15/2015    Past Surgical History:  Procedure Laterality Date  . ABDOMINAL HYSTERECTOMY    . BREAST REDUCTION SURGERY    . CHOLECYSTECTOMY    . EMBOLECTOMY N/A    pulmonary embolectomy    OB History    No data available       Home Medications    Prior to Admission medications   Medication Sig Start Date End Date Taking? Authorizing Provider  albuterol (PROVENTIL HFA;VENTOLIN HFA) 108 (90 Base) MCG/ACT inhaler Inhale 1-2 puffs into the lungs every 6 (six) hours as needed for  wheezing or shortness of breath. 02/08/16  Yes Maryan Puls, MD  amLODipine (NORVASC) 5 MG tablet Take 1 tablet (5 mg total) by mouth daily. 04/30/16  Yes Maren Reamer, MD  aspirin 81 MG chewable tablet Chew 81 mg by mouth daily.   Yes Historical Provider, MD  atenolol (TENORMIN) 50 MG tablet Take 50 mg by mouth daily as needed (BP).    Yes Historical Provider, MD  citalopram (CELEXA) 40 MG tablet Take 1 tablet (40 mg total) by mouth daily. Reported on 02/20/2016 04/30/16  Yes Maren Reamer, MD  diphenhydramine-acetaminophen (TYLENOL PM) 25-500 MG TABS tablet Take 1 tablet by mouth at bedtime as needed (sleep).   Yes Historical Provider, MD  docusate sodium (COLACE) 100 MG capsule Take 100 mg by mouth 2 (two) times daily as needed for mild constipation.    Yes Historical Provider, MD  furosemide (LASIX) 40 MG tablet Take 1 tablet (40 mg total) by mouth daily. 05/07/16  Yes Maren Reamer, MD  gabapentin (NEURONTIN) 100 MG capsule Take 1 capsule (100 mg total) by mouth every 12 (twelve) hours as needed. Patient taking differently: Take 100 mg by mouth every 12 (twelve) hours as needed (nerve pain).  04/30/16  Yes Maren Reamer, MD  potassium chloride (K-DUR) 10 MEQ tablet Take 1 tablet (10 mEq total) by mouth 2 (two) times daily.  04/24/16  Yes Lisabeth Pick, MD  rivaroxaban (XARELTO) 20 MG TABS tablet Take 1 tablet (20 mg total) by mouth daily with supper. 02/20/16  Yes Maren Reamer, MD  triamcinolone cream (KENALOG) 0.1 % Apply 1 application topically 2 (two) times daily. For rash on left leg 05/15/16  Yes Daleen Bo, MD  zaleplon (SONATA) 5 MG capsule Take 1 capsule (5 mg total) by mouth at bedtime as needed for sleep. 04/30/16  Yes Maren Reamer, MD  Melatonin 3 MG TABS Take 1 tablet (3 mg total) by mouth at bedtime. Patient taking differently: Take 5 mg by mouth at bedtime.  03/06/16   Maren Reamer, MD    Family History Family History  Problem Relation Age of Onset  . Lung  cancer Maternal Grandmother     Social History Social History  Substance Use Topics  . Smoking status: Never Smoker  . Smokeless tobacco: Never Used  . Alcohol use No     Allergies   Review of patient's allergies indicates no known allergies.   Review of Systems Review of Systems  Cardiovascular: Positive for chest pain.  Musculoskeletal: Positive for back pain.  Neurological: Positive for numbness.  All other systems reviewed and are negative.    Physical Exam Updated Vital Signs BP 142/80 (BP Location: Right Arm)   Pulse 73   Resp 17   SpO2 99%   Physical Exam  Constitutional: She is oriented to person, place, and time.  Obese, uncomfortable   HENT:  Head: Normocephalic.  Eyes: Pupils are equal, round, and reactive to light.  Neck: Normal range of motion.  Cardiovascular: Normal rate, regular rhythm and normal heart sounds.   CABG scar healing well   Pulmonary/Chest: Effort normal.  Abdominal: Soft. Bowel sounds are normal.  Musculoskeletal: Normal range of motion.  Neurological: She is alert and oriented to person, place, and time.  Dec sensation R face, arm, leg. Strength 4/5 R arm and leg, 5/5 L side.   Skin: Skin is warm.  Psychiatric: She has a normal mood and affect.  Nursing note and vitals reviewed.    ED Treatments / Results  Labs (all labs ordered are listed, but only abnormal results are displayed) Labs Reviewed  CBC - Abnormal; Notable for the following:       Result Value   RBC 5.69 (*)    Hemoglobin 11.5 (*)    HCT 35.5 (*)    MCV 62.4 (*)    MCH 20.2 (*)    RDW 18.4 (*)    All other components within normal limits  BASIC METABOLIC PANEL  I-STAT TROPOININ, ED    EKG  EKG Interpretation  Date/Time:  Wednesday June 26 2016 16:04:08 EDT Ventricular Rate:  77 PR Interval:    QRS Duration: 128 QT Interval:  411 QTC Calculation: 466 R Axis:   55 Text Interpretation:  Sinus rhythm Right bundle branch block RBBB new since  previous  Confirmed by Fredericka Bottcher  MD, Anika Shore ([id number]) on 06/26/2016 10:13:58 PM       Radiology Dg Chest 2 View  Result Date: 06/26/2016 CLINICAL DATA:  Right-sided chest pain and shortness of Breath EXAM: CHEST  2 VIEW COMPARISON:  05/15/2016 FINDINGS: Cardiac shadow is stable. Postoperative changes are again seen. A right chest wall port is again seen and stable. Lungs are well aerated bilaterally. Some persistent scarring in the lateral lung base on the right is noted. No focal infiltrate or sizable effusion is seen. No acute bony  abnormality is noted. IMPRESSION: Chronic scarring in the right base.  No acute abnormality is noted. Electronically Signed   By: Inez Catalina M.D.   On: 06/26/2016 16:58    Procedures Procedures (including critical care time)  Medications Ordered in ED Medications - No data to display   Initial Impression / Assessment and Plan / ED Course  I have reviewed the triage vital signs and the nursing notes.  Pertinent labs & imaging results that were available during my care of the patient were reviewed by me and considered in my medical decision making (see chart for details).  Clinical Course    [name] [name] is a 55 y.o. female here with R sided numbness, weakness, chest pain. 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 Consider ACS vs stroke. Will get labs, CT head, CXR. Will likely admit for stroke workup.   12:01 AM CT head unremarkable. Labs at baseline. Called Dr. Nicole Kindred from neuro, who recommend MRI brain first and then if it shows stroke, consult neuro formally. Will admit to hospitalist.    Final Clinical Impressions(s) / ED Diagnoses   Final diagnoses:  None    New Prescriptions New Prescriptions   No medications on file     Drenda Freeze, MD 06/27/16 0002

## 2016-06-26 NOTE — ED Notes (Signed)
Patient states she has been under a lot of "extra stress" recently. Patient states over the past 5 days she has experienced "heaviness in right arm" "numbness to right foot" and "right sided head and face pain" patient states she has had a recent fall and is on Xarelto for POST CABG. Patient does have SOB with exertion . A&OX4 at this time

## 2016-06-26 NOTE — Patient Outreach (Signed)
[facility]) Care Management  06/26/2016  [name] [name] 03-03-1961 [id number]   CSW was able to make contact with patient today to follow-up regarding social work services and resources.  Patient admits that she received the packet of resource information that CSW mailed to her home and that she has been utilizing the resources for transportation, as well as reviewing her resource information for Depression.  CSW was able to ensure that patient has the correct contact information for CSW, encouraging patient to contact CSW if additional assistance is needed in the near future.  Patient voiced understanding and was agreeable to this plan. CSW will perform a case closure on patient, as all goals of treatment have been met from social work standpoint and no additional social work needs have been identified at this time.  CSW will notify patient's RNCM with [facility], Tish Men of CSW's plans to close patient's case.  CSW will fax an update to patient's Primary Care Physician, Dr. Lottie Mussel to ensure that they are aware of CSW's involvement with patient's plan of care.  CSW will submit a case closure request to Verlon Setting, Care Management Assistant with [facility], in the form of an [employer].  CSW will ensure that [name] is aware of Darlin Priestly, RNCM with [facility], continued involvement with patient's care. Nat Christen, BSW, MSW, LCSW  Licensed Education officer, [facility]  Mailing [place] [address], [place], [place] [address] Physical Address-[address], [place], [place] [address] Toll Free Main # [phone] Fax # [phone] Cell # [phone]  Fax # [phone]  [email or web address]

## 2016-06-26 NOTE — ED Triage Notes (Signed)
Pt from home with complaints of right sided chest and arm pain and head pain x 1 week Pt had cardiac surgery in July for her pulmonary hypertension and a blood clot.  Pt fell last week and has complaints of right sided back pain. Pt does not think she hit her head. Pt takes blood thinners. Pt states that back pain, arm pain, and head pain began when she fell. Pt has strong radial pulses, adequate cap refill, and clear lung sounds. Pt's chest pain is right sided, sharp and reproducible.

## 2016-06-27 ENCOUNTER — Observation Stay (HOSPITAL_COMMUNITY): Payer: Commercial Managed Care - HMO

## 2016-06-27 DIAGNOSIS — S0990XA Unspecified injury of head, initial encounter: Secondary | ICD-10-CM | POA: Diagnosis not present

## 2016-06-27 DIAGNOSIS — I639 Cerebral infarction, unspecified: Secondary | ICD-10-CM | POA: Insufficient documentation

## 2016-06-27 DIAGNOSIS — R0789 Other chest pain: Secondary | ICD-10-CM

## 2016-06-27 DIAGNOSIS — M542 Cervicalgia: Secondary | ICD-10-CM | POA: Diagnosis not present

## 2016-06-27 DIAGNOSIS — I2699 Other pulmonary embolism without acute cor pulmonale: Secondary | ICD-10-CM | POA: Diagnosis not present

## 2016-06-27 DIAGNOSIS — S199XXA Unspecified injury of neck, initial encounter: Secondary | ICD-10-CM | POA: Diagnosis not present

## 2016-06-27 DIAGNOSIS — R531 Weakness: Secondary | ICD-10-CM | POA: Diagnosis not present

## 2016-06-27 DIAGNOSIS — I2724 Chronic thromboembolic pulmonary hypertension: Secondary | ICD-10-CM | POA: Diagnosis not present

## 2016-06-27 DIAGNOSIS — G894 Chronic pain syndrome: Secondary | ICD-10-CM

## 2016-06-27 DIAGNOSIS — S299XXA Unspecified injury of thorax, initial encounter: Secondary | ICD-10-CM | POA: Diagnosis not present

## 2016-06-27 DIAGNOSIS — S59901A Unspecified injury of right elbow, initial encounter: Secondary | ICD-10-CM | POA: Diagnosis not present

## 2016-06-27 DIAGNOSIS — S3992XA Unspecified injury of lower back, initial encounter: Secondary | ICD-10-CM | POA: Diagnosis not present

## 2016-06-27 DIAGNOSIS — M19041 Primary osteoarthritis, right hand: Secondary | ICD-10-CM | POA: Diagnosis not present

## 2016-06-27 DIAGNOSIS — I10 Essential (primary) hypertension: Secondary | ICD-10-CM

## 2016-06-27 LAB — HEPATIC FUNCTION PANEL
ALT: 9 U/L — ABNORMAL LOW (ref 14–54)
AST: 18 U/L (ref 15–41)
Albumin: 3.7 g/dL (ref 3.5–5.0)
Alkaline Phosphatase: 57 U/L (ref 38–126)
Bilirubin, Direct: <0.1 mg/dL — ABNORMAL LOW (ref 0.1–0.5)
Total Bilirubin: 0.6 mg/dL (ref 0.3–1.2)
Total Protein: 7.1 g/dL (ref 6.5–8.1)

## 2016-06-27 LAB — TROPONIN I
Troponin I: <0.03 ng/mL (ref <0.03)
Troponin I: <0.03 ng/mL (ref <0.03)
Troponin I: <0.03 ng/mL (ref <0.03)

## 2016-06-27 LAB — LIPID PANEL
Cholesterol: 166 mg/dL (ref 0–200)
HDL: 33 mg/dL — ABNORMAL LOW (ref >40)
LDL Cholesterol: 108 mg/dL — ABNORMAL HIGH (ref 0–99)
Total CHOL/HDL Ratio: 5 RATIO
Triglycerides: 125 mg/dL (ref <150)
VLDL: 25 mg/dL (ref 0–40)

## 2016-06-27 LAB — VITAMIN B12: Vitamin B-12: 443 pg/mL (ref 180–914)

## 2016-06-27 MED ORDER — ACETAMINOPHEN 500 MG PO TABS
1000.0000 mg | ORAL_TABLET | Freq: Once | ORAL | Status: AC
  Administered 2016-06-27: 1000 mg via ORAL
  Filled 2016-06-27: qty 2

## 2016-06-27 MED ORDER — POTASSIUM CHLORIDE ER 10 MEQ PO TBCR
10.0000 meq | EXTENDED_RELEASE_TABLET | Freq: Two times a day (BID) | ORAL | Status: DC
  Administered 2016-06-27 – 2016-06-28 (×4): 10 meq via ORAL
  Filled 2016-06-27 (×7): qty 1

## 2016-06-27 MED ORDER — SODIUM CHLORIDE 0.9% FLUSH
10.0000 mL | INTRAVENOUS | Status: DC | PRN
  Administered 2016-06-28: 10 mL
  Filled 2016-06-27: qty 40

## 2016-06-27 MED ORDER — AMLODIPINE BESYLATE 5 MG PO TABS
5.0000 mg | ORAL_TABLET | Freq: Every day | ORAL | Status: DC
  Administered 2016-06-27 – 2016-06-28 (×2): 5 mg via ORAL
  Filled 2016-06-27 (×2): qty 1

## 2016-06-27 MED ORDER — DIPHENHYDRAMINE HCL 25 MG PO CAPS
25.0000 mg | ORAL_CAPSULE | Freq: Every evening | ORAL | Status: DC | PRN
  Administered 2016-06-27: 25 mg via ORAL
  Filled 2016-06-27: qty 1

## 2016-06-27 MED ORDER — ACETAMINOPHEN 500 MG PO TABS
500.0000 mg | ORAL_TABLET | Freq: Every evening | ORAL | Status: DC | PRN
  Administered 2016-06-27: 500 mg via ORAL
  Filled 2016-06-27: qty 1

## 2016-06-27 MED ORDER — DOCUSATE SODIUM 100 MG PO CAPS
100.0000 mg | ORAL_CAPSULE | Freq: Two times a day (BID) | ORAL | Status: DC | PRN
  Administered 2016-06-28: 100 mg via ORAL
  Filled 2016-06-27: qty 1

## 2016-06-27 MED ORDER — BISACODYL 10 MG RE SUPP: 10.0000 mg | Freq: Every day | RECTAL | Status: DC

## 2016-06-27 MED ORDER — HYDROCODONE-ACETAMINOPHEN 5-325 MG PO TABS
1.0000 | ORAL_TABLET | ORAL | Status: DC | PRN
  Administered 2016-06-27 (×2): 1 via ORAL
  Filled 2016-06-27 (×2): qty 1

## 2016-06-27 MED ORDER — SENNOSIDES-DOCUSATE SODIUM 8.6-50 MG PO TABS
2.0000 | ORAL_TABLET | Freq: Two times a day (BID) | ORAL | Status: DC
  Administered 2016-06-27: 2 via ORAL
  Filled 2016-06-27: qty 2

## 2016-06-27 MED ORDER — RIVAROXABAN 20 MG PO TABS
20.0000 mg | ORAL_TABLET | Freq: Every day | ORAL | Status: DC
  Administered 2016-06-27: 20 mg via ORAL
  Filled 2016-06-27: qty 1

## 2016-06-27 MED ORDER — ASPIRIN 81 MG PO CHEW
81.0000 mg | CHEWABLE_TABLET | Freq: Every day | ORAL | Status: DC
  Administered 2016-06-27 – 2016-06-28 (×2): 81 mg via ORAL
  Filled 2016-06-27 (×2): qty 1

## 2016-06-27 MED ORDER — MORPHINE SULFATE (PF) 2 MG/ML IV SOLN
2.0000 mg | INTRAVENOUS | Status: DC | PRN
  Administered 2016-06-27: 2 mg via INTRAVENOUS
  Filled 2016-06-27: qty 1

## 2016-06-27 MED ORDER — TRIAMCINOLONE ACETONIDE 0.1 % EX CREA
1.0000 "application " | TOPICAL_CREAM | Freq: Two times a day (BID) | CUTANEOUS | Status: DC
  Administered 2016-06-27: 1 via TOPICAL
  Filled 2016-06-27: qty 15

## 2016-06-27 MED ORDER — DIPHENHYDRAMINE HCL 25 MG PO CAPS: 25.0000 mg | ORAL_CAPSULE | Freq: Three times a day (TID) | ORAL | Status: DC | PRN

## 2016-06-27 MED ORDER — FUROSEMIDE 40 MG PO TABS
40.0000 mg | ORAL_TABLET | Freq: Every day | ORAL | Status: DC
  Administered 2016-06-27 – 2016-06-28 (×2): 40 mg via ORAL
  Filled 2016-06-27 (×2): qty 1

## 2016-06-27 MED ORDER — TECHNETIUM TO 99M ALBUMIN AGGREGATED
4.0000 | Freq: Once | INTRAVENOUS | Status: AC | PRN
  Administered 2016-06-27: 4 via INTRAVENOUS

## 2016-06-27 MED ORDER — TECHNETIUM TC 99M DIETHYLENETRIAME-PENTAACETIC ACID
31.0000 | Freq: Once | INTRAVENOUS | Status: AC
  Administered 2016-06-27: 31 via INTRAVENOUS

## 2016-06-27 MED ORDER — CITALOPRAM HYDROBROMIDE 20 MG PO TABS
40.0000 mg | ORAL_TABLET | Freq: Every day | ORAL | Status: DC
  Administered 2016-06-27 – 2016-06-28 (×2): 40 mg via ORAL
  Filled 2016-06-27 (×2): qty 2

## 2016-06-27 MED ORDER — POLYETHYLENE GLYCOL 3350 17 G PO PACK: 17.0000 g | PACK | Freq: Every day | ORAL | Status: DC

## 2016-06-27 MED ORDER — DIPHENHYDRAMINE-APAP (SLEEP) 25-500 MG PO TABS: 1.0000 | ORAL_TABLET | Freq: Every evening | ORAL | Status: DC | PRN

## 2016-06-27 NOTE — Progress Notes (Addendum)
Spoke with pt concerning CPAP filters. Pt was not sure where it was purchased from. Pt given instructions to call the 800 number on her machine to order filters or take machine name to [facility] store to purchase filters. Pt states that her insurance will not allow her to have a HHRN, Heart MD from [place] told her no PT for 3 months at home. Will continue to follow pt.

## 2016-06-27 NOTE — Progress Notes (Signed)
Attempted MRI at [facility] this am, Pt would not clear bore of scanner. Spoke with [facility] RN

## 2016-06-27 NOTE — ED Notes (Signed)
Patient ambulatory to restroom with steady gait and personal cane

## 2016-06-27 NOTE — Progress Notes (Addendum)
Pt selected [facility] for [place] for [facility] needs. Referral given to in house rep.

## 2016-06-27 NOTE — Progress Notes (Signed)
OT Cancellation Note  Patient Details Name: Tyanah Tietjen MRN: [id number] DOB: [birth year]   Cancelled Treatment:    Reason Eval/Treat Not Completed: Other (comment);Medical issues which prohibited therapy  Medical issues which prohibited therapy  Unable to obtain MRI this morning, further imaging ordered.  V/Q scan reports high probability of right-sided PE with known hx of right-sided PE.  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 Work up and plan of care still pending for ACS vs stroke.  Will check back tomorrow  Betsy Pries 06/27/2016, 12:29 PM

## 2016-06-27 NOTE — Care Management Obs Status (Signed)
[place] NOTIFICATION   Patient Details  Name: [name] [name] MRN: [id number] Date of Birth: [birth year]   Medicare Observation Status Notification Given:       Purcell Mouton, RN 06/27/2016, 3:16 PM

## 2016-06-27 NOTE — Progress Notes (Signed)
PT Cancellation Note  Patient Details Name: [name] [name] MRN: [id number] DOB: August 01, 1961   Cancelled Treatment:    Reason Eval/Treat Not Completed: Medical issues which prohibited therapy  Unable to obtain MRI this morning, further imaging ordered.  V/Q scan reports high probability of right-sided PE with known hx of right-sided PE.  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 Work up and plan of care still pending for ACS vs stroke.  Will check back tomorrow.  If PT evaluation is imminently needed today, please page.   Obelia Bonello,KATHrine E 06/27/2016, 12:06 PM Carmelia Bake, PT, DPT 06/27/2016 Pager: [phone]

## 2016-06-27 NOTE — Evaluation (Signed)
{  EVAL NOTES:30SLP Cancellation Note  Patient Details Name: [name] [name] MRN: [id number] DOB: 10-May-1961   Cancelled treatment:       Reason Eval/Treat Not Completed: Medical issues which prohibited therapy (Reason Eval/Treat Not Completed: Medical issues which prohibited therapy  Unable to obtain MRI this morning, further imaging ordered.  V/Q scan reports high probability of right-sided PE with known hx of right-sided PE.  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 Work up underway, will check back at a later time.   Luanna Salk, [place] [facility] SLP [phone]

## 2016-06-27 NOTE — Progress Notes (Signed)
Patient states she has a CPAP machine at home, but does not have replacement filters and mask. She denies the use of hospital supplied CPAP tonight. She explains that the MD who originally wrote for her home CPAP is no longer her doctor. She does, however, agree to call her new primary MD for a prescription if the home health company desires such for replacement supplies. RT will continue to follow.

## 2016-06-27 NOTE — Progress Notes (Signed)
PROGRESS NOTE  [name] [name] [id number] DOB: [birth year] DOA: 06/26/2016 PCP: Maren Reamer, MD  HPI/Recap of past 24 hours:  Feeling better  Assessment/Plan: Principal Problem:   Right sided weakness Active Problems:   Chronic pain syndrome   OSA on CPAP   CTEPH (chronic thromboembolic pulmonary hypertension)   HTN (hypertension)   Atypical chest pain    Right sided numbness since after fall Not able to get mri at [facility] long due to body habitus, will get ct head in 24hr to r/o cva, will also get ct spine to r/o radiculopathy Will get PT/OT  H/o PE s/p thromboembolectomy in 03/2016, continue on xarelto, no sob, vital statble   HTN: stable on current regimen  Constipation: stool softener  Morbid obesity Body mass index is 54.95 kg/m.  Code Status: full  Family Communication: patient   Disposition Plan: likely home on 10/20   Consultants: EDP talked to neurology  Procedures:  none  Antibiotics:  none   Objective: BP (!) 142/64 (BP Location: Right Wrist)   Pulse 73   Temp 98.9 F (37.2 C) (Oral)   Resp 20   Ht 5\' 3"  (1.6 m)   Wt (!) 140.7 kg (310 lb 3.2 oz)   SpO2 98%   BMI 54.95 kg/m   Intake/Output Summary (Last 24 hours) at 06/27/16 1150 Last data filed at 06/27/16 0859  Gross per 24 hour  Intake              360 ml  Output                0 ml  Net              360 ml   Filed Weights   06/27/16 0700  Weight: (!) 140.7 kg (310 lb 3.2 oz)    Exam:   General:  NAD, obese  Cardiovascular: RRR  Respiratory: CTABL, chest wall tenderness right to the sternum   Abdomen: Soft/ND/NT, positive BS  Musculoskeletal: No Edema, range of motion of right arm and hand intact, does has pain with activity Neuro: aaox3 Data Reviewed: Basic Metabolic Panel:  Recent Labs Lab 06/26/16 1712  NA 140  K 3.9  CL 105  CO2 25  GLUCOSE 84  BUN 12  CREATININE 0.88  CALCIUM 9.7   Liver Function Tests:  Recent Labs Lab  06/27/16 0346  AST 18  ALT 9*  ALKPHOS 57  BILITOT 0.6  PROT 7.1  ALBUMIN 3.7   No results for input(s): LIPASE, AMYLASE in the last 168 hours. No results for input(s): AMMONIA in the last 168 hours. CBC:  Recent Labs Lab 06/26/16 1712  WBC 6.8  HGB 11.5*  HCT 35.5*  MCV 62.4*  PLT 335   Cardiac Enzymes:    Recent Labs Lab 06/27/16 0346 06/27/16 0830  TROPONINI <0.03 <0.03   BNP (last 3 results)  Recent Labs  03/20/16 1247  BNP 88.3    ProBNP (last 3 results) No results for input(s): PROBNP in the last 8760 hours.  CBG: No results for input(s): GLUCAP in the last 168 hours.  No results found for this or any previous visit (from the past 240 hour(s)).   Studies: Dg Chest 2 View  Result Date: 06/26/2016 CLINICAL DATA:  Right-sided chest pain and shortness of Breath EXAM: CHEST  2 VIEW COMPARISON:  05/15/2016 FINDINGS: Cardiac shadow is stable. Postoperative changes are again seen. A right chest wall port is again seen and stable. Lungs are well aerated  bilaterally. Some persistent scarring in the lateral lung base on the right is noted. No focal infiltrate or sizable effusion is seen. No acute bony abnormality is noted. IMPRESSION: Chronic scarring in the right base.  No acute abnormality is noted. Electronically Signed   By: Inez Catalina M.D.   On: 06/26/2016 16:58   Ct Head Wo Contrast  Result Date: 06/26/2016 CLINICAL DATA:  Acute onset of headache and right-sided chest and arm pain. Patient on blood thinners. Recent fall. Initial encounter. EXAM: CT HEAD WITHOUT CONTRAST TECHNIQUE: Contiguous axial images were obtained from the base of the skull through the vertex without intravenous contrast. COMPARISON:  CT of the head performed 11/12/2015 FINDINGS: Brain: No evidence of acute infarction, hemorrhage, hydrocephalus, extra-axial collection or mass lesion/mass effect. The posterior fossa, including the cerebellum, brainstem and fourth ventricle, is within  normal limits. The third and lateral ventricles, and basal ganglia are unremarkable in appearance. The cerebral hemispheres are symmetric in appearance, with normal gray-white differentiation. No mass effect or midline shift is seen. Vascular: No hyperdense vessel or unexpected calcification. Skull: There is no evidence of fracture; visualized osseous structures are unremarkable in appearance. Sinuses/Orbits: The orbits are within normal limits. The paranasal sinuses and mastoid air cells are well-aerated. Other: No significant soft tissue abnormalities are seen. IMPRESSION: No evidence of traumatic intracranial injury or fracture. Electronically Signed   By: Garald Balding M.D.   On: 06/26/2016 23:27    Scheduled Meds: . amLODipine  5 mg Oral Daily  . aspirin  81 mg Oral Daily  . citalopram  40 mg Oral Daily  . furosemide  40 mg Oral Daily  . potassium chloride  10 mEq Oral BID  . rivaroxaban  20 mg Oral Q supper  . triamcinolone cream  1 application Topical BID    Continuous Infusions:      Tyjai Charbonnet MD, PhD  [facility] Pager [phone]. If 7PM-7AM, please contact night-coverage at [email or web address], password [facility] 06/27/2016, 11:50 AM  LOS: 0 days

## 2016-06-27 NOTE — H&P (Signed)
History and Physical    [name] [name] [id number] DOB: 1961/02/03 DOA: 06/26/2016  PCP: Maren Reamer, MD   Patient coming from: Home  Chief Complaint: Atypical right sided chest pain, right sided weakness and numbness  HPI: [name] [name] is a 55 y.o. woman with a history of chronic thromboembolic pulmonary HTN S/P pulmonary endarterectomy in July 2017 at [facility].  She is a poor historian, so several details pulled from documentation in [place].  Of note she had pre-existing atypical chest pain prior to surgery this summer, and she has continued to have intermittent chest pain since surgery.  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  She has had a CTA chest in this system in September (post-surgery) that did not show PE.  She has been taking her Xarelto as prescribed.  She has followed up with both CT surgery and pulmonary at [facility] since this summer. She does not have known CAD, but she has cardiac risk factors of HTN, HLD, and obesity.  Her chest pain is right-sided, but it is sometimes exertional.   She reports that she is "always short of breath".  Regarding her neuro complaints, it is noted that she complained of left sided parasthesias while being evaluated at [facility], and she was treated for presumed shingles to her left thigh while there.  Today, she reports five days of right sided weakness in upper and lower extremity, as well as decreased sensation in her right face and extremities.  She also has involuntary face twitching, which is noted in the ED.    ED Course: EKG shows NSR with RBBB.  Head CT negative for acute process.  Chest xray negative for acute process.  Troponin negative.  Basic labs relatively unremarkable.  Hospitalist asked to admit for stroke evaluation.  Review of Systems: She admits to increased stress secondary to grandson being ill.  As per HPI otherwise 10  point review of systems negative.    Past Medical History:  Diagnosis Date  . Arthritis   . Depression   . Diverticulitis   . Hypertension   . Obesity   . Pneumonia   . Pulmonary embolism [facility])     Past Surgical History:  Procedure Laterality Date  . ABDOMINAL HYSTERECTOMY    . BREAST REDUCTION SURGERY    . CHOLECYSTECTOMY    . EMBOLECTOMY N/A    pulmonary embolectomy     reports that she has never smoked. She has never used smokeless tobacco. She reports that she does not drink alcohol or use drugs. She has one living adult child.  She has custody of an 52 yo grandchild.    No Known Allergies  Family History  Problem Relation Age of Onset  . Lung cancer Maternal Grandmother      Prior to Admission medications   Medication Sig Start Date End Date Taking? Authorizing Provider  albuterol (PROVENTIL HFA;VENTOLIN HFA) 108 (90 Base) MCG/ACT inhaler Inhale 1-2 puffs into the lungs every 6 (six) hours as needed for wheezing or shortness of breath. 02/08/16  Yes Maryan Puls, MD  amLODipine (NORVASC) 5 MG tablet Take 1 tablet (5 mg total) by mouth daily. 04/30/16  Yes Maren Reamer, MD  aspirin 81 MG chewable tablet Chew 81 mg by mouth daily.   Yes Historical Provider, MD  atenolol (TENORMIN) 50 MG tablet Take 50 mg by mouth daily as needed (BP).  Yes Historical Provider, MD  citalopram (CELEXA) 40 MG tablet Take 1 tablet (40 mg total) by mouth daily. Reported on 02/20/2016 04/30/16  Yes Maren Reamer, MD  diphenhydramine-acetaminophen (TYLENOL PM) 25-500 MG TABS tablet Take 1 tablet by mouth at bedtime as needed (sleep).   Yes Historical Provider, MD  docusate sodium (COLACE) 100 MG capsule Take 100 mg by mouth 2 (two) times daily as needed for mild constipation.    Yes Historical Provider, MD  furosemide (LASIX) 40 MG tablet Take 1 tablet (40 mg total) by mouth daily. 05/07/16  Yes Maren Reamer, MD  gabapentin (NEURONTIN) 100 MG capsule Take 1 capsule (100 mg total) by  mouth every 12 (twelve) hours as needed. Patient taking differently: Take 100 mg by mouth every 12 (twelve) hours as needed (nerve pain).  04/30/16  Yes Maren Reamer, MD  potassium chloride (K-DUR) 10 MEQ tablet Take 1 tablet (10 mEq total) by mouth 2 (two) times daily. 04/24/16  Yes Lisabeth Pick, MD  rivaroxaban (XARELTO) 20 MG TABS tablet Take 1 tablet (20 mg total) by mouth daily with supper. 02/20/16  Yes Maren Reamer, MD  triamcinolone cream (KENALOG) 0.1 % Apply 1 application topically 2 (two) times daily. For rash on left leg 05/15/16  Yes Daleen Bo, MD  zaleplon (SONATA) 5 MG capsule Take 1 capsule (5 mg total) by mouth at bedtime as needed for sleep. 04/30/16  Yes Maren Reamer, MD    Physical Exam: Vitals:   06/26/16 2205 06/26/16 2321 06/27/16 0005 06/27/16 0146  BP: 142/80  143/74 (!) 142/64  Pulse: 73  76 73  Resp: 17  22 20   Temp:  98.5 F (36.9 C)  98.9 F (37.2 C)  TempSrc:    Oral  SpO2: 99%  92% 98%  Height:    5\' 3"  (1.6 m)      Constitutional: NAD, calm, comfortable Vitals:   06/26/16 2205 06/26/16 2321 06/27/16 0005 06/27/16 0146  BP: 142/80  143/74 (!) 142/64  Pulse: 73  76 73  Resp: 17  22 20   Temp:  98.5 F (36.9 C)  98.9 F (37.2 C)  TempSrc:    Oral  SpO2: 99%  92% 98%  Height:    5\' 3"  (1.6 m)   Eyes: PERRL, lids and conjunctivae normal ENMT: Mucous membranes are moist. Posterior pharynx clear of any exudate or lesions. Normal dentition.  Neck: normal appearance, supple Respiratory: clear to auscultation bilaterally, no wheezing, no crackles. Normal respiratory effort. No accessory muscle use.  Cardiovascular: Normal rate, regular rhythm, no murmurs / rubs / gallops. No extremity edema. 2+ pedal pulses. No carotid bruits.  GI: abdomen is OBESE but soft and compressible.  No distention.  No tenderness.  No masses palpated.  Bowel sounds are present. Musculoskeletal:  No joint deformity in upper and lower extremities. Good ROM, no  contractures. Normal muscle tone.  Skin: Appears to have a healed rash to left thigh, also evidence of skin graft to that same side Neurologic: Decreased sensation in right face otherwise cranial nerves appear to be intact.  Subtle right sided weakness in her extremities with decreased sensation reported.  No pronator drift.  No nystagmus.  Patient ambulated with cane in the ED per RN report. Psychiatric: Normal judgment and insight. Alert and oriented x 3. Normal mood.     Labs on Admission: I have personally reviewed following labs and imaging studies  CBC:  Recent Labs Lab 06/26/16 1712  WBC 6.8  HGB 11.5*  HCT 35.5*  MCV 62.4*  PLT 123456   Basic Metabolic Panel:  Recent Labs Lab 06/26/16 1712  NA 140  K 3.9  CL 105  CO2 25  GLUCOSE 84  BUN 12  CREATININE 0.88  CALCIUM 9.7   GFR: CrCl cannot be calculated (Unknown ideal weight.).  Cardiac Enzymes: First troponin negative  Radiological Exams on Admission: Dg Chest 2 View  Result Date: 06/26/2016 CLINICAL DATA:  Right-sided chest pain and shortness of Breath EXAM: CHEST  2 VIEW COMPARISON:  05/15/2016 FINDINGS: Cardiac shadow is stable. Postoperative changes are again seen. A right chest wall port is again seen and stable. Lungs are well aerated bilaterally. Some persistent scarring in the lateral lung base on the right is noted. No focal infiltrate or sizable effusion is seen. No acute bony abnormality is noted. IMPRESSION: Chronic scarring in the right base.  No acute abnormality is noted. Electronically Signed   By: Inez Catalina M.D.   On: 06/26/2016 16:58   Ct Head Wo Contrast  Result Date: 06/26/2016 CLINICAL DATA:  Acute onset of headache and right-sided chest and arm pain. Patient on blood thinners. Recent fall. Initial encounter. EXAM: CT HEAD WITHOUT CONTRAST TECHNIQUE: Contiguous axial images were obtained from the base of the skull through the vertex without intravenous contrast. COMPARISON:  CT of the head  performed 11/12/2015 FINDINGS: Brain: No evidence of acute infarction, hemorrhage, hydrocephalus, extra-axial collection or mass lesion/mass effect. The posterior fossa, including the cerebellum, brainstem and fourth ventricle, is within normal limits. The third and lateral ventricles, and basal ganglia are unremarkable in appearance. The cerebral hemispheres are symmetric in appearance, with normal gray-white differentiation. No mass effect or midline shift is seen. Vascular: No hyperdense vessel or unexpected calcification. Skull: There is no evidence of fracture; visualized osseous structures are unremarkable in appearance. Sinuses/Orbits: The orbits are within normal limits. The paranasal sinuses and mastoid air cells are well-aerated. Other: No significant soft tissue abnormalities are seen. IMPRESSION: No evidence of traumatic intracranial injury or fracture. Electronically Signed   By: Garald Balding M.D.   On: 06/26/2016 23:27    Assessment/Plan Principal Problem:   Right sided weakness Active Problems:   Chronic pain syndrome   OSA on CPAP   CTEPH (chronic thromboembolic pulmonary hypertension)   HTN (hypertension)   Atypical chest pain      Right sided weakness with numbness --MRI (if not contraindicated with recent intrathoracic surgery) to rule out subacute CVA (symptoms present for at least five days at this point) --Will also check B12 and folate levels now --PT/OT eval and treat --If CVA diagnosed, she will need formal neurology consult and complete stroke evaluation  Atypical chest pain --Will proceed with echo and V/Q scan since these are the tests pulmonary was planning to repeat in four months at her follow-up at [facility].  I do not think another CTA of her chest is warranted at this time, especially since she has been compliant with her Xarelto. --Serial troponin.  Consider ischemic evaluation at some point if troponin negative (she has cardiac risk factors).  Chronic  PE --Continue Xarelto  HTN --Amlodipine, lasix    DVT prophylaxis: Anticoagulated with Xarelto Code Status: FULL Family Communication: Patient alone in the ED at time of admission. Disposition Plan: To be determined. Consults called: NONE.   Admission status: Observation, telemetry   TIME SPENT: 70 minutes   Eber Jones MD [facility] Pager [phone]  If 7PM-7AM, please contact night-coverage [email or web address] Password [email or web address]  06/27/2016, 2:53 AM

## 2016-06-28 ENCOUNTER — Observation Stay (HOSPITAL_COMMUNITY): Payer: Commercial Managed Care - HMO

## 2016-06-28 DIAGNOSIS — R531 Weakness: Secondary | ICD-10-CM | POA: Diagnosis not present

## 2016-06-28 LAB — FOLATE RBC
Folate, Hemolysate: 416 ng/mL
Folate, RBC: 1234 ng/mL (ref >498)
Hematocrit: 33.7 % — ABNORMAL LOW (ref 34.0–46.6)

## 2016-06-28 LAB — HEMOGLOBIN A1C
Hgb A1c MFr Bld: 5.6 % (ref 4.8–5.6)
Mean Plasma Glucose: 114 mg/dL

## 2016-06-28 MED ORDER — ATORVASTATIN CALCIUM 20 MG PO TABS: 20.0000 mg | ORAL_TABLET | Freq: Every day | ORAL | 0 refills | Status: DC

## 2016-06-28 MED ORDER — FUROSEMIDE 40 MG PO TABS: 40.0000 mg | ORAL_TABLET | Freq: Every day | ORAL | 0 refills | Status: DC

## 2016-06-28 MED ORDER — ATORVASTATIN CALCIUM 10 MG PO TABS: 20.0000 mg | ORAL_TABLET | Freq: Every day | ORAL | Status: DC

## 2016-06-28 MED ORDER — POTASSIUM CHLORIDE ER 10 MEQ PO TBCR: 10.0000 meq | EXTENDED_RELEASE_TABLET | Freq: Two times a day (BID) | ORAL | 0 refills | Status: DC

## 2016-06-28 MED ORDER — PERFLUTREN LIPID MICROSPHERE
INTRAVENOUS | Status: AC
  Filled 2016-06-28: qty 10

## 2016-06-28 MED ORDER — HEPARIN SOD (PORK) LOCK FLUSH 100 UNIT/ML IV SOLN
500.0000 [IU] | INTRAVENOUS | Status: AC | PRN
  Administered 2016-06-28: 500 [IU]

## 2016-06-28 MED FILL — ATORVASTATIN 20 MG TABLET: 20 | 30 days supply | Qty: 30 | Fill #0

## 2016-06-28 MED FILL — POTASSIUM CL 10 MEQ TAB SA: 10 | 15 days supply | Qty: 30 | Fill #0

## 2016-06-28 MED FILL — FUROSEMIDE 40 MG TABLET: 40 | 30 days supply | Qty: 30 | Fill #0

## 2016-06-28 NOTE — Discharge Summary (Addendum)
Discharge Summary  [name] [name] [id number] DOB: 1960-11-25  PCP: Maren Reamer, MD  Admit date: 06/26/2016 Discharge date: 06/28/2016  Time spent: <58mins  Recommendations for Outpatient Follow-up:  1. F/u with PMD within a week  for hospital discharge follow up, repeat cbc/bmp at follow up 2. F/u with [facility] in two weeks  Discharge Diagnoses:  Active Hospital Problems   Diagnosis Date Noted  . Right sided weakness 06/27/2016  . HTN (hypertension) 06/27/2016  . Atypical chest pain 06/27/2016  . 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 OSA on CPAP 02/20/2016  . Chronic pain syndrome 01/31/2016    Resolved Hospital Problems   Diagnosis Date Noted Date Resolved  No resolved problems to display.    Discharge Condition: stable  Diet recommendation: heart healthy  Filed Weights   06/27/16 0700  Weight: (!) 140.7 kg (310 lb 3.2 oz)    History of present illness:  Patient coming from: Home  Chief Complaint: Atypical right sided chest pain, right sided weakness and numbness  HPI: [name] [name] is a 55 y.o. woman with a history of chronic thromboembolic pulmonary HTN S/P pulmonary endarterectomy in July 2017 at [facility].  She is a poor historian, so several details pulled from documentation in [place].  Of note she had pre-existing atypical chest pain prior to surgery this summer, and she has continued to have intermittent chest pain since surgery.  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  She has had a CTA chest in this system in September (post-surgery) that did not show PE.  She has been taking her Xarelto as prescribed.  She has followed up with both CT surgery and pulmonary at [facility] since this summer. She does not have known CAD, but she has cardiac risk factors of HTN, HLD, and obesity.  Her chest pain is right-sided,  but it is sometimes exertional.   She reports that she is "always short of breath".  Regarding her neuro complaints, it is noted that she complained of left sided parasthesias while being evaluated at [facility], and she was treated for presumed shingles to her left thigh while there.  Today, she reports five days of right sided weakness in upper and lower extremity, as well as decreased sensation in her right face and extremities.  She also has involuntary face twitching, which is noted in the ED.    ED Course: EKG shows NSR with RBBB.  Head CT negative for acute process.  Chest xray negative for acute process.  Troponin negative.  Basic labs relatively unremarkable.  Hospitalist asked to admit for stroke evaluation.  Hospital Course:  Principal Problem:   Right sided weakness Active Problems:   Chronic pain syndrome   OSA on CPAP   CTEPH (chronic thromboembolic pulmonary hypertension)   HTN (hypertension)   Atypical chest pain   Right sided numbness since after fall Not able to get mri at [facility] long due to body habitus, repeat ct head in 24hr negative for cva, ct spine no acute abnormalities, x ray to right elbow and right hand no acute findings Patient agreed to home health OT, she will follow up with [place] pulmonology for physical therapy instructions  H/o PE s/p thromboembolectomy in 03/2016, continue on xarelto, no sob, vital stable, follow up with [facility]   HTN: stable on current regimen, continue home meds she takes novasc and atenolol as needed,  she takes lasix daily, she will follow up with [facility] for medication management  She does has sinus tachycardia with activity which she report is chronic.  Hyperlipidemia: ldl 108, start low dose lipitor  Constipation: resolved with stool softener  Morbid obesity Body mass index is 54.95 kg/m.  Code Status: full  Family Communication: patient   Disposition Plan: home on 10/20   Consultants: EDP talked to  neurology  Procedures:  none  Antibiotics:  none    Discharge Exam: BP (!) 130/59 (BP Location: Left Wrist) Comment: nurse notified  Pulse 71   Temp 97.8 F (36.6 C) (Oral)   Resp 20   Ht 5\' 3"  (1.6 m)   Wt (!) 140.7 kg (310 lb 3.2 oz)   SpO2 99%   BMI 54.95 kg/m     General:  NAD, obese  Cardiovascular: RRR  Respiratory: CTABL, chest wall tenderness right to the sternum   Abdomen: Soft/ND/NT, positive BS  Musculoskeletal: No Edema, range of motion of right arm and hand intact, does has pain with activity            Neuro: aaox3    Discharge Instructions You were cared for by a hospitalist during your hospital stay. If you have any questions about your discharge medications or the care you received while you were in the hospital after you are discharged, you can call the unit and asked to speak with the hospitalist on call if the hospitalist that took care of you is not available. Once you are discharged, your primary care physician will handle any further medical issues. Please note that NO REFILLS for any discharge medications will be authorized once you are discharged, as it is imperative that you return to your primary care physician (or establish a relationship with a primary care physician if you do not have one) for your aftercare needs so that they can reassess your need for medications and monitor your lab values.  Discharge Instructions    Diet - low sodium heart healthy    Complete by:  As directed    Face-to-face encounter (required for Medicare/Medicaid patients)    Complete by:  As directed    I [name] certify that this patient is under my care and that I, or a nurse practitioner or physician's assistant working with me, had a face-to-face encounter that meets the physician face-to-face encounter requirements with this patient on 06/27/2016. The encounter with the patient was in whole, or in part for the following medical condition(s) which is the  primary reason for home health care (List medical condition): FTT   The encounter with the patient was in whole, or in part, for the following medical condition, which is the primary reason for home health care:  FTT   I certify that, based on my findings, the following services are medically necessary home health services:  Physical therapy   Reason for Medically Necessary Home Health Services:  Therapy- Personnel officer, Public librarian   My clinical findings support the need for the above services:  Pain interferes with ambulation/mobility   Further, I certify that my clinical findings support that this patient is homebound due to:  Shortness of Breath with activity   Home Health    Complete by:  As directed    To provide the following care/treatments:  OT   Increase activity slowly    Complete by:  As directed        Medication List    TAKE these  medications   albuterol 108 (90 Base) MCG/ACT inhaler Commonly known as:  PROVENTIL HFA;VENTOLIN HFA Inhale 1-2 puffs into the lungs every 6 (six) hours as needed for wheezing or shortness of breath.   amLODipine 5 MG tablet Commonly known as:  NORVASC Take 1 tablet (5 mg total) by mouth daily.   aspirin 81 MG chewable tablet Chew 81 mg by mouth daily.   atenolol 50 MG tablet Commonly known as:  TENORMIN Take 50 mg by mouth daily as needed (BP).   atorvastatin 20 MG tablet Commonly known as:  LIPITOR Take 1 tablet (20 mg total) by mouth daily at 6 PM.   citalopram 40 MG tablet Commonly known as:  CELEXA Take 1 tablet (40 mg total) by mouth daily. Reported on 02/20/2016   diphenhydramine-acetaminophen 25-500 MG Tabs tablet Commonly known as:  TYLENOL PM Take 1 tablet by mouth at bedtime as needed (sleep).   docusate sodium 100 MG capsule Commonly known as:  COLACE Take 100 mg by mouth 2 (two) times daily as needed for mild constipation.   furosemide 40 MG tablet Commonly known as:  LASIX Take 1 tablet (40  mg total) by mouth daily.   gabapentin 100 MG capsule Commonly known as:  NEURONTIN Take 1 capsule (100 mg total) by mouth every 12 (twelve) hours as needed. What changed:  reasons to take this   potassium chloride 10 MEQ tablet Commonly known as:  K-DUR Take 1 tablet (10 mEq total) by mouth 2 (two) times daily.   rivaroxaban 20 MG Tabs tablet Commonly known as:  XARELTO Take 1 tablet (20 mg total) by mouth daily with supper.   triamcinolone cream 0.1 % Commonly known as:  KENALOG Apply 1 application topically 2 (two) times daily. For rash on left leg   zaleplon 5 MG capsule Commonly known as:  SONATA Take 1 capsule (5 mg total) by mouth at bedtime as needed for sleep.      No Known Allergies Follow-up Information    [facility] .   Specialty:  Home Health Services Why:  [facility] nursing,physical/occupational therapy,aide,social worker Contact information:  [place] [address] [phone]        Maren Reamer, MD Follow up in 1 week(s).   Specialty:  Internal Medicine Why:  hospital discharge follow up, repeat bmp at follow up Contact information: [place] [address] [phone]        TEST, Lazaro Arms, MD Follow up in 2 week(s).   Specialty:  Pulmonary Disease Contact information: [place] [address] [phone]            The results of significant diagnostics from this hospitalization (including imaging, microbiology, ancillary and laboratory) are listed below for reference.    Significant Diagnostic Studies: Dg Chest 2 View  Result Date: 06/26/2016 CLINICAL DATA:  Right-sided chest pain and shortness of Breath EXAM: CHEST  2 VIEW COMPARISON:  05/15/2016 FINDINGS: Cardiac shadow is stable. Postoperative changes are again seen. A right chest wall port is again seen and stable. Lungs are well aerated bilaterally. Some persistent scarring in the lateral lung base on the  right is noted. No focal infiltrate or sizable effusion is seen. No acute bony abnormality is noted. IMPRESSION: Chronic scarring in the right base.  No acute abnormality is noted. Electronically Signed   By: Inez Catalina M.D.   On: 06/26/2016 16:58   Dg Elbow 2 Views Right  Result Date:  06/28/2016 CLINICAL DATA:  Fall. EXAM: RIGHT ELBOW - 2 VIEW COMPARISON:  No interim change. FINDINGS: No acute bony or joint abnormality. No fracture dislocation. Degenerative changes right elbow. IMPRESSION: Degenerative changes right elbow.  No acute abnormality. Electronically Signed   By: Marcello Moores  Register   On: 06/28/2016 07:09   Ct Head Wo Contrast  Result Date: 06/27/2016 CLINICAL DATA:  [place] 5 days ago.  Pain down her arms. EXAM: CT HEAD WITHOUT CONTRAST CT CERVICAL SPINE WITHOUT CONTRAST TECHNIQUE: Multidetector CT imaging of the head and cervical spine was performed following the standard protocol without intravenous contrast. Multiplanar CT image reconstructions of the cervical spine were also generated. COMPARISON:  11/12/2015 FINDINGS: CT HEAD FINDINGS Brain: No evidence of acute infarction, hemorrhage, hydrocephalus, extra-axial collection or mass lesion/mass effect. Vascular: No hyperdense vessel or unexpected calcification. Skull: Normal. Negative for fracture or focal lesion. Sinuses/Orbits: Small bilateral mastoid effusions are present. The paranasal sinuses are clear. Orbits are intact. Other: None CT CERVICAL SPINE FINDINGS Study quality is degraded by patient body habitus significant streak artifact obscures detail in the lower cervical levels. Alignment: There is loss of cervical lordosis. This may be secondary to splinting, soft tissue injury, or positioning. Alignment is otherwise normal. Skull base and vertebrae: No acute fracture. No primary bone lesion or focal pathologic process. Soft tissues and spinal canal: No prevertebral fluid or swelling. No visible canal hematoma. Disc levels:  Mild disc  height loss in the lower cervical spine. Upper chest: Negative. Other: None IMPRESSION: 1.  No evidence for acute intracranial abnormality. 2. No evidence for acute cervical spine fracture. Loss of cervical lordosis may be secondary to positioning or splinting versus soft tissue injury. 3. Quality of the cervical spine exam is limited by patient body habitus. Electronically Signed   By: Nolon Nations M.D.   On: 06/27/2016 16:39   Ct Head Wo Contrast  Result Date: 06/26/2016 CLINICAL DATA:  Acute onset of headache and right-sided chest and arm pain. Patient on blood thinners. Recent fall. Initial encounter. EXAM: CT HEAD WITHOUT CONTRAST TECHNIQUE: Contiguous axial images were obtained from the base of the skull through the vertex without intravenous contrast. COMPARISON:  CT of the head performed 11/12/2015 FINDINGS: Brain: No evidence of acute infarction, hemorrhage, hydrocephalus, extra-axial collection or mass lesion/mass effect. The posterior fossa, including the cerebellum, brainstem and fourth ventricle, is within normal limits. The third and lateral ventricles, and basal ganglia are unremarkable in appearance. The cerebral hemispheres are symmetric in appearance, with normal gray-white differentiation. No mass effect or midline shift is seen. Vascular: No hyperdense vessel or unexpected calcification. Skull: There is no evidence of fracture; visualized osseous structures are unremarkable in appearance. Sinuses/Orbits: The orbits are within normal limits. The paranasal sinuses and mastoid air cells are well-aerated. Other: No significant soft tissue abnormalities are seen. IMPRESSION: No evidence of traumatic intracranial injury or fracture. Electronically Signed   By: Garald Balding M.D.   On: 06/26/2016 23:27   Ct Cervical Spine Wo Contrast  Result Date: 06/27/2016 CLINICAL DATA:  [place] 5 days ago.  Pain down her arms. EXAM: CT HEAD WITHOUT CONTRAST CT CERVICAL SPINE WITHOUT CONTRAST TECHNIQUE:  Multidetector CT imaging of the head and cervical spine was performed following the standard protocol without intravenous contrast. Multiplanar CT image reconstructions of the cervical spine were also generated. COMPARISON:  11/12/2015 FINDINGS: CT HEAD FINDINGS Brain: No evidence of acute infarction, hemorrhage, hydrocephalus, extra-axial collection or mass lesion/mass effect. Vascular: No hyperdense vessel or unexpected calcification. Skull:  Normal. Negative for fracture or focal lesion. Sinuses/Orbits: Small bilateral mastoid effusions are present. The paranasal sinuses are clear. Orbits are intact. Other: None CT CERVICAL SPINE FINDINGS Study quality is degraded by patient body habitus significant streak artifact obscures detail in the lower cervical levels. Alignment: There is loss of cervical lordosis. This may be secondary to splinting, soft tissue injury, or positioning. Alignment is otherwise normal. Skull base and vertebrae: No acute fracture. No primary bone lesion or focal pathologic process. Soft tissues and spinal canal: No prevertebral fluid or swelling. No visible canal hematoma. Disc levels:  Mild disc height loss in the lower cervical spine. Upper chest: Negative. Other: None IMPRESSION: 1.  No evidence for acute intracranial abnormality. 2. No evidence for acute cervical spine fracture. Loss of cervical lordosis may be secondary to positioning or splinting versus soft tissue injury. 3. Quality of the cervical spine exam is limited by patient body habitus. Electronically Signed   By: Nolon Nations M.D.   On: 06/27/2016 16:39   Ct Thoracic Spine Wo Contrast  Result Date: 06/27/2016 CLINICAL DATA:  Status post fall 5 days ago, with pain extending down the arms and left-sided paresthesias. Right upper and lower extremity weakness. Initial encounter. EXAM: CT THORACIC AND LUMBAR SPINE WITHOUT CONTRAST TECHNIQUE: Multidetector CT imaging of the thoracic and lumbar spine was performed without  contrast. Multiplanar CT image reconstructions were also generated. COMPARISON:  None. FINDINGS: CT THORACIC SPINE FINDINGS Alignment: Normal. Vertebrae: No acute fracture or focal pathologic process. Paraspinal and other soft tissues: The visualized paraspinal musculature is unremarkable in appearance. Minimal scarring is noted at the right lung base. The visualized portions of the mediastinum are grossly unremarkable. The visualized thyroid gland is unremarkable. Disc levels: Mild vacuum phenomenon is noted at T11-T12. Scattered anterior and lateral osteophytes are noted along the lower thoracic spine. The bony foramina are grossly unremarkable in appearance. CT LUMBAR SPINE FINDINGS Segmentation: 5 lumbar type vertebrae. Alignment: Normal. Vertebrae: No acute fracture or focal pathologic process. Paraspinal and other soft tissues: The visualized paraspinal musculature is unremarkable in appearance. Minimal calcification is noted along the abdominal aorta. An IVC filter is noted in expected position. Disc levels: Vertebral bodies demonstrate normal height and alignment. Intervertebral disc spaces are preserved. Facet disease is noted along the lumbar spine. IMPRESSION: 1. No evidence of fracture or subluxation along the thoracic or lumbar spine. 2. Scattered anterior and lateral osteophytes along the lower thoracic spine. 3. Mild vacuum phenomenon at T11-T12. 4. Minimal scarring at the right lung base. Electronically Signed   By: Garald Balding M.D.   On: 06/27/2016 16:37   Ct Lumbar Spine Wo Contrast  Result Date: 06/27/2016 CLINICAL DATA:  Status post fall 5 days ago, with pain extending down the arms and left-sided paresthesias. Right upper and lower extremity weakness. Initial encounter. EXAM: CT THORACIC AND LUMBAR SPINE WITHOUT CONTRAST TECHNIQUE: Multidetector CT imaging of the thoracic and lumbar spine was performed without contrast. Multiplanar CT image reconstructions were also generated.  COMPARISON:  None. FINDINGS: CT THORACIC SPINE FINDINGS Alignment: Normal. Vertebrae: No acute fracture or focal pathologic process. Paraspinal and other soft tissues: The visualized paraspinal musculature is unremarkable in appearance. Minimal scarring is noted at the right lung base. The visualized portions of the mediastinum are grossly unremarkable. The visualized thyroid gland is unremarkable. Disc levels: Mild vacuum phenomenon is noted at T11-T12. Scattered anterior and lateral osteophytes are noted along the lower thoracic spine. The bony foramina are grossly unremarkable in appearance. CT LUMBAR  SPINE FINDINGS Segmentation: 5 lumbar type vertebrae. Alignment: Normal. Vertebrae: No acute fracture or focal pathologic process. Paraspinal and other soft tissues: The visualized paraspinal musculature is unremarkable in appearance. Minimal calcification is noted along the abdominal aorta. An IVC filter is noted in expected position. Disc levels: Vertebral bodies demonstrate normal height and alignment. Intervertebral disc spaces are preserved. Facet disease is noted along the lumbar spine. IMPRESSION: 1. No evidence of fracture or subluxation along the thoracic or lumbar spine. 2. Scattered anterior and lateral osteophytes along the lower thoracic spine. 3. Mild vacuum phenomenon at T11-T12. 4. Minimal scarring at the right lung base. Electronically Signed   By: Garald Balding M.D.   On: 06/27/2016 16:37   Dg Hand 2 View Right  Result Date: 06/28/2016 CLINICAL DATA:  Fall. EXAM: RIGHT HAND - 2 VIEW COMPARISON:  No recent prior. FINDINGS: No acute bony or joint abnormality identified. No evidence of fracture dislocation. Diffuse mild degenerative change. IMPRESSION: Diffuse mild degenerative change.  No acute abnormality. Electronically Signed   By: Marcello Moores  Register   On: 06/28/2016 07:10   Nm Pulmonary Perf And Vent  Result Date: 06/27/2016 CLINICAL DATA:  History of chronic thromboembolic disease.  Known right pulmonary artery clot. EXAM: NUCLEAR MEDICINE VENTILATION - PERFUSION LUNG SCAN TECHNIQUE: Ventilation images were obtained in multiple projections using inhaled aerosol Tc-27m DTPA. Perfusion images were obtained in multiple projections after intravenous injection of Tc-30m MAA. RADIOPHARMACEUTICALS:  31.0 mCi Technetium-60m DTPA aerosol inhalation and 4.0 mCi Technetium-58m MAA IV COMPARISON:  CT 05/15/2016. FINDINGS: Large ventilation-perfusion mismatch noted along the right lateral lung. This finding suggests high probability pulmonary embolus. IMPRESSION: Prominent ventilation perfusion mismatch noted on the right. Finding suggests high probability right-sided pulmonary embolus in this patient with known right-sided pulmonary embolus. Electronically Signed   By: Marcello Moores  Register   On: 06/27/2016 11:46    Microbiology: No results found for this or any previous visit (from the past 240 hour(s)).   Labs: Basic Metabolic Panel:  Recent Labs Lab 06/26/16 1712  NA 140  K 3.9  CL 105  CO2 25  GLUCOSE 84  BUN 12  CREATININE 0.88  CALCIUM 9.7   Liver Function Tests:  Recent Labs Lab 06/27/16 0346  AST 18  ALT 9*  ALKPHOS 57  BILITOT 0.6  PROT 7.1  ALBUMIN 3.7   No results for input(s): LIPASE, AMYLASE in the last 168 hours. No results for input(s): AMMONIA in the last 168 hours. CBC:  Recent Labs Lab 06/26/16 1712  WBC 6.8  HGB 11.5*  HCT 35.5*  MCV 62.4*  PLT 335   Cardiac Enzymes:  Recent Labs Lab 06/27/16 0346 06/27/16 0830 06/27/16 1500  TROPONINI <0.03 <0.03 <0.03   BNP: BNP (last 3 results)  Recent Labs  03/20/16 1247  BNP 88.3    ProBNP (last 3 results) No results for input(s): PROBNP in the last 8760 hours.  CBG: No results for input(s): GLUCAP in the last 168 hours.     SignedFlorencia Reasons MD, PhD  [facility] 06/28/2016, 11:42 AM

## 2016-06-28 NOTE — Progress Notes (Signed)
HR sustained to 140s-160s while patient up bathing in BR. Patient asymptomatic. Hospitalist paged to be made aware. Will continue to monitor.

## 2016-06-28 NOTE — Progress Notes (Signed)
Speech Language Pathology  Patient Details Name: Jung Burback MRN: [id number] DOB: 13-Jul-1961 Today's Date: 06/28/2016 Time:  -     Chart reviewed and spoke with OT who did not observed cognitive deficits throughout her assessment; pt appropriate. No acute event on CT scan. No formal assessment warranted.        Houston Siren 06/28/2016, 1:17 PM   Orbie Pyo Colvin Caroli.Ed [employer] [phone]

## 2016-06-28 NOTE — Care Management Obs Status (Signed)
[place] NOTIFICATION   Patient Details  Name: [name] [name] MRN: [id number] Date of Birth: May 18, 1961   Medicare Observation Status Notification Given:  Yes    MahabirJuliann Pulse, RN 06/28/2016, 10:55 AM

## 2016-06-28 NOTE — Progress Notes (Signed)
   06/28/16 1100  Clinical Encounter Type  Visited With Patient  Visit Type Initial  Referral From Nurse  Consult/Referral To Chaplain  Spiritual Encounters  Spiritual Needs Emotional  CHP responded to [facility] consult. Provided ministry of presence and listening.  Patient talked about physical and family stress factors.  Patient discharging today. Roe Coombs 06/28/16

## 2016-06-28 NOTE — Care Management Note (Signed)
Case Management Note  Patient Details  Name: [name] [name] MRN: [id number] Date of Birth: November 05, 1960  Subjective/Objective: d/c today. [place] orders already placed-[facility] rep Edwinna aware of orders, & d/c today. CM has already noted CPAP issues & has provided instruction see prior CM note.Patient to f/u w/pcp on CPAP-otpt.MD informed.No further CM needs.                   Action/Plan:d/c home w/HHC.   Expected Discharge Date:                  Expected Discharge Plan:  [place]  In-House Referral:     Discharge planning Services  CM Consult  Post Acute Care Choice:    Choice offered to:  Patient  DME Arranged:    DME Agency:     HH Arranged:  RN, NA, PT, OT, Nurse's Aide [place] Agency:  [facility]  Status of Service:  In process, will continue to follow  If discussed at Long Length of Stay Meetings, dates discussed:    Additional Comments:  Dessa Phi, RN 06/28/2016, 9:37 AM

## 2016-06-28 NOTE — Evaluation (Signed)
Occupational Therapy Evaluation Patient Details Name: [name] [name] MRN: [id number] DOB: [birth year] Today's Date: 06/28/2016    History of Present Illness Pt is a 55 year old female with hx of CAD s/p CABG and PE s/p thromboembolectomy in 03/2016, admitted with Right sided chest pain and right sided numbness and weakness   Clinical Impression   Pt admitted with  Chest pain as well as R sided weakness. Pt currently with functional limitations due to the deficits listed below (see OT Problem List).  Pt will benefit from skilled OT to increase their safety and independence with ADL and functional mobility for ADL to facilitate discharge to venue listed below.      Follow Up Recommendations  Home health OT    Equipment Recommendations  None recommended by OT       Precautions / Restrictions Precautions Precautions: Fall      Mobility Bed Mobility Overal bed mobility: Needs Assistance Bed Mobility: Supine to Sit     Supine to sit: Min assist     General bed mobility comments: Sitting EOB on arrival and departure  Transfers Overall transfer level: Needs assistance Equipment used: Straight cane Transfers: Sit to/from Stand Sit to Stand: Supervision Stand pivot transfers: Min assist                 ADL Overall ADL's : Needs assistance/impaired             Lower Body Bathing: Moderate assistance;Sit to/from stand   Upper Body Dressing : Minimal assistance;Sitting;Cueing for safety;Cueing for sequencing   Lower Body Dressing: Moderate assistance;Sit to/from stand;Cueing for sequencing;Cueing for safety   Toilet Transfer: Minimal assistance;Ambulation;Cueing for safety;Cueing for sequencing   Toileting- Clothing Manipulation and Hygiene: Minimal assistance;Sit to/from stand;Moderate assistance Toileting - Clothing Manipulation Details (indicate cue type and reason): husband will A as needed                       Pertinent Vitals/Pain Pain  Assessment: No/denies pain Pain Score: 4  Pain Location: r chest Pain Descriptors / Indicators: Sharp Pain Intervention(s): Monitored during session;Limited activity within patient's tolerance;Patient requesting pain meds-RN notified        Extremity/Trunk Assessment Upper Extremity Assessment Upper Extremity Assessment: Generalized weakness   Lower Extremity Assessment Lower Extremity Assessment: Generalized weakness (reports R LE now feels back to normal, denies numbness/tingling)       Communication Communication Communication: No difficulties   Cognition Arousal/Alertness: Awake/alert Behavior During Therapy: WFL for tasks assessed/performed Overall Cognitive Status: Within Functional Limits for tasks assessed                     General Comments   energy conservation handout provided.  Pt thankful for handout and stated she wished she had received it earlier            Home Living Family/patient expects to be discharged to:: Private residence Living Arrangements: Spouse/significant other;Other (Comment) Available Help at Discharge: Family Type of Home: House Home Access: Stairs to enter [employer] of Steps: 4 Entrance Stairs-Rails: Can reach both Home Layout: One level     Bathroom Shower/Tub: Teacher, early years/pre: Handicapped height     Home Equipment: Clinical cytogeneticist - 2 wheels;Cane - single point   Additional Comments: reacher      Prior Functioning/Environment Level of Independence: Needs assistance  Gait / Transfers Assistance Needed: ambulatory with cane or RW ADL's / Homemaking Assistance Needed: husband helping with  ADL activity s/p surgery but pt was improving daily            OT Problem List: Decreased strength;Decreased activity tolerance   OT Treatment/Interventions: Self-care/ADL training;DME and/or AE instruction    OT Goals(Current goals can be found in the care plan section) Acute Rehab OT  Goals Patient Stated Goal: get stronger OT Goal Formulation: With patient Time For Goal Achievement: 07/05/16 Potential to Achieve Goals: Good  OT Frequency: Min 2X/week              End of Session Equipment Utilized During Treatment: Rolling walker Nurse Communication: Mobility status  Activity Tolerance: Patient tolerated treatment well Patient left: with call bell/phone within reach;in bed   Time: 1030-1054 OT Time Calculation (min): 24 min Charges:  OT General Charges $OT Visit: 1 Procedure OT Evaluation $OT Eval Moderate Complexity: 1 Procedure OT Treatments $Self Care/Home Management : 8-22 mins G-Codes: OT G-codes **NOT FOR INPATIENT CLASS** Functional Assessment Tool Used: clinical observation Functional Limitation: Self care Self Care Current Status [id number]): At least 40 percent but less than 60 percent impaired, limited or restricted Self Care Goal Status [id number]): At least 1 percent but less than 20 percent impaired, limited or restricted Self Care Discharge Status [phone]): At least 40 percent but less than 60 percent impaired, limited or restricted  Paia, Edwena Felty D 06/28/2016, 11:10 AM

## 2016-06-28 NOTE — Evaluation (Signed)
Physical Therapy One Time Evaluation Patient Details Name: [name] [name] MRN: [id number] DOB: 1960/09/16 Today's Date: 06/28/2016   History of Present Illness  Pt is a 55 year old female with hx of CAD s/p CABG and PE s/p thromboembolectomy in 03/2016, admitted with Right sided chest pain and right sided numbness and weakness  Clinical Impression  Patient evaluated by Physical Therapy with no further acute PT needs identified. All education has been completed and the patient has no further questions. Pt up in bathroom bathing prior to arrival with elevated HR (see RN progress note) however RN paged MD and MD okay for PT to work with pt.  Pt had seated rest break while discussing prior level of function and PMHx prior to ambulating.  Pt states her MD at [facility] told her no therapy for 3 months, which would be over at the end of this month, so she plans to f/u with him in regards to PT recommendations.  Pt very deconditioned and SOB/fatigued with minimal exertion (pt states HR also elevated with this).  Pt feels her mobility has returned her current baseline, only reports some R shoulder weakness still remains however R UE deficits resolving since admission. PT is signing off. Thank you for this referral.    Follow Up Recommendations Other (comment) (pt to f/u with [facility] MD for PT recommendations)    Equipment Recommendations  None recommended by PT    Recommendations for Other Services       Precautions / Restrictions Precautions Precautions: Fall      Mobility  Bed Mobility Overal bed mobility: Needs Assistance Bed Mobility: Supine to Sit     Supine to sit: Min assist     General bed mobility comments: Sitting EOB on arrival and departure  Transfers Overall transfer level: Needs assistance Equipment used: Straight cane Transfers: Sit to/from Stand Sit to Stand: Supervision Stand pivot transfers: Min assist          Ambulation/Gait Ambulation/Gait assistance:  Supervision Ambulation Distance (Feet): 80 Feet Assistive device: Rolling walker (2 wheeled) Gait Pattern/deviations: Step-through pattern;Decreased stride length     General Gait Details: steady with SPC, only reports fatigue, HR 130s during ambulation  Stairs            Wheelchair Mobility    Modified Rankin (Stroke Patients Only)       Balance                                             Pertinent Vitals/Pain Pain Assessment: No/denies pain Pain Score: 4  Pain Location: r chest Pain Descriptors / Indicators: Sharp Pain Intervention(s): Monitored during session;Limited activity within patient's tolerance;Patient requesting pain meds-RN notified    Home Living Family/patient expects to be discharged to:: Private residence Living Arrangements: Spouse/significant other;Other (Comment) Available Help at Discharge: Family Type of Home: House Home Access: Stairs to enter Entrance Stairs-Rails: Can reach both Entrance Stairs-Number of Steps: 4 Home Layout: One level Home Equipment: Clinical cytogeneticist - 2 wheels;Cane - single point Additional Comments: reacher    Prior Function Level of Independence: Needs assistance   Gait / Transfers Assistance Needed: ambulatory with cane or RW  ADL's / Homemaking Assistance Needed: husband helping with ADL activity s/p surgery but pt was improving daily        Hand Dominance        Extremity/Trunk Assessment   Upper  Extremity Assessment: Generalized weakness           Lower Extremity Assessment: Generalized weakness (reports R LE now feels back to normal, denies numbness/tingling)         Communication   Communication: No difficulties  Cognition Arousal/Alertness: Awake/alert Behavior During Therapy: WFL for tasks assessed/performed Overall Cognitive Status: Within Functional Limits for tasks assessed                      General Comments      Exercises     Assessment/Plan     PT Assessment All further PT needs can be met in the next venue of care  PT Problem List Decreased mobility;Cardiopulmonary status limiting activity          PT Treatment Interventions      PT Goals (Current goals can be found in the Care Plan section)  Acute Rehab PT Goals Patient Stated Goal: get stronger PT Goal Formulation: All assessment and education complete, DC therapy    Frequency     Barriers to discharge        Co-evaluation               End of Session   Activity Tolerance: Patient tolerated treatment well Patient left: in bed;with call bell/phone within reach Nurse Communication: Mobility status    Functional Assessment Tool Used: clinical judgement Functional Limitation: Mobility: Walking and moving around Mobility: Walking and Moving Around Current Status ([id number]): At least 1 percent but less than 20 percent impaired, limited or restricted Mobility: Walking and Moving Around Goal Status [phone]): At least 1 percent but less than 20 percent impaired, limited or restricted Mobility: Walking and Moving Around Discharge Status [phone]): At least 1 percent but less than 20 percent impaired, limited or restricted    Time: 0946-1000 PT Time Calculation (min) (ACUTE ONLY): 14 min   Charges:   PT Evaluation $PT Eval Low Complexity: 1 Procedure     PT G Codes:   PT G-Codes **NOT FOR INPATIENT CLASS** Functional Assessment Tool Used: clinical judgement Functional Limitation: Mobility: Walking and moving around Mobility: Walking and Moving Around Current Status ([id number]): At least 1 percent but less than 20 percent impaired, limited or restricted Mobility: Walking and Moving Around Goal Status [phone]): At least 1 percent but less than 20 percent impaired, limited or restricted Mobility: Walking and Moving Around Discharge Status [phone]): At least 1 percent but less than 20 percent impaired, limited or restricted    Joni Norrod,KATHrine E 06/28/2016, 10:51 AM Carmelia Bake, PT, DPT 06/28/2016 Pager: [phone]

## 2016-06-29 DIAGNOSIS — R0789 Other chest pain: Secondary | ICD-10-CM | POA: Diagnosis not present

## 2016-06-29 DIAGNOSIS — R531 Weakness: Secondary | ICD-10-CM | POA: Diagnosis not present

## 2016-06-29 DIAGNOSIS — G894 Chronic pain syndrome: Secondary | ICD-10-CM | POA: Diagnosis not present

## 2016-06-29 DIAGNOSIS — I2724 Chronic thromboembolic pulmonary hypertension: Secondary | ICD-10-CM | POA: Diagnosis not present

## 2016-06-29 DIAGNOSIS — I10 Essential (primary) hypertension: Secondary | ICD-10-CM | POA: Diagnosis not present

## 2016-07-01 ENCOUNTER — Ambulatory Visit: Payer: Commercial Managed Care - HMO | Admitting: Internal Medicine

## 2016-07-02 ENCOUNTER — Telehealth: Payer: Self-pay | Admitting: Internal Medicine

## 2016-07-02 DIAGNOSIS — I10 Essential (primary) hypertension: Secondary | ICD-10-CM | POA: Diagnosis not present

## 2016-07-02 DIAGNOSIS — R531 Weakness: Secondary | ICD-10-CM | POA: Diagnosis not present

## 2016-07-02 DIAGNOSIS — G894 Chronic pain syndrome: Secondary | ICD-10-CM | POA: Diagnosis not present

## 2016-07-02 DIAGNOSIS — R0789 Other chest pain: Secondary | ICD-10-CM | POA: Diagnosis not present

## 2016-07-02 DIAGNOSIS — I2724 Chronic thromboembolic pulmonary hypertension: Secondary | ICD-10-CM | POA: Diagnosis not present

## 2016-07-02 NOTE — Telephone Encounter (Signed)
Patient called the office to speak with PCP regarding the appointment that she needs as soon as possible. Pt was in the hospital and needs a follow up. She was unable to make yesterday's appt because she had to take a family member to the hospital. Offered her appts for Nov but she can't wait that long due to pain and condition. No other available appt with PCP until then. Please follow up [phone] or [phone].  Thank you

## 2016-07-02 NOTE — Telephone Encounter (Signed)
Could you contact pt and schedule an appointment if Dr. Jarold Song doesn't have any openings this month then double book Dr. Janne Napoleon before the month is over thanks

## 2016-07-04 ENCOUNTER — Encounter: Payer: Self-pay | Admitting: Family Medicine

## 2016-07-04 ENCOUNTER — Ambulatory Visit: Payer: Commercial Managed Care - HMO | Attending: Family Medicine | Admitting: Family Medicine

## 2016-07-04 ENCOUNTER — Encounter: Payer: Commercial Managed Care - HMO | Admitting: Physical Medicine & Rehabilitation

## 2016-07-04 VITALS — BP 120/74 | HR 88 | Temp 98.1°F | Ht 63.0 in | Wt 309.0 lb

## 2016-07-04 DIAGNOSIS — I10 Essential (primary) hypertension: Secondary | ICD-10-CM

## 2016-07-04 DIAGNOSIS — M1990 Unspecified osteoarthritis, unspecified site: Secondary | ICD-10-CM | POA: Diagnosis not present

## 2016-07-04 DIAGNOSIS — G4733 Obstructive sleep apnea (adult) (pediatric): Secondary | ICD-10-CM | POA: Insufficient documentation

## 2016-07-04 DIAGNOSIS — R0789 Other chest pain: Secondary | ICD-10-CM | POA: Diagnosis not present

## 2016-07-04 DIAGNOSIS — F329 Major depressive disorder, single episode, unspecified: Secondary | ICD-10-CM | POA: Diagnosis not present

## 2016-07-04 DIAGNOSIS — F411 Generalized anxiety disorder: Secondary | ICD-10-CM | POA: Insufficient documentation

## 2016-07-04 DIAGNOSIS — E669 Obesity, unspecified: Secondary | ICD-10-CM | POA: Diagnosis not present

## 2016-07-04 DIAGNOSIS — R000 Tachycardia, unspecified: Secondary | ICD-10-CM | POA: Diagnosis not present

## 2016-07-04 DIAGNOSIS — Z7901 Long term (current) use of anticoagulants: Secondary | ICD-10-CM | POA: Insufficient documentation

## 2016-07-04 DIAGNOSIS — M7541 Impingement syndrome of right shoulder: Secondary | ICD-10-CM

## 2016-07-04 DIAGNOSIS — R200 Anesthesia of skin: Secondary | ICD-10-CM | POA: Insufficient documentation

## 2016-07-04 DIAGNOSIS — R531 Weakness: Secondary | ICD-10-CM | POA: Diagnosis not present

## 2016-07-04 DIAGNOSIS — G894 Chronic pain syndrome: Secondary | ICD-10-CM | POA: Diagnosis not present

## 2016-07-04 DIAGNOSIS — Z7982 Long term (current) use of aspirin: Secondary | ICD-10-CM | POA: Diagnosis not present

## 2016-07-04 DIAGNOSIS — I272 Pulmonary hypertension, unspecified: Secondary | ICD-10-CM | POA: Diagnosis present

## 2016-07-04 DIAGNOSIS — Z86711 Personal history of pulmonary embolism: Secondary | ICD-10-CM | POA: Insufficient documentation

## 2016-07-04 DIAGNOSIS — I2724 Chronic thromboembolic pulmonary hypertension: Secondary | ICD-10-CM | POA: Insufficient documentation

## 2016-07-04 DIAGNOSIS — Z09 Encounter for follow-up examination after completed treatment for conditions other than malignant neoplasm: Secondary | ICD-10-CM | POA: Insufficient documentation

## 2016-07-04 DIAGNOSIS — Z9989 Dependence on other enabling machines and devices: Secondary | ICD-10-CM

## 2016-07-04 MED ORDER — HYDROXYZINE HCL 25 MG PO TABS: 25.0000 mg | ORAL_TABLET | Freq: Three times a day (TID) | ORAL | 0 refills | Status: DC | PRN

## 2016-07-04 MED ORDER — METOPROLOL TARTRATE 25 MG PO TABS: 25.0000 mg | ORAL_TABLET | Freq: Two times a day (BID) | ORAL | 3 refills | Status: DC

## 2016-07-04 MED FILL — TRAVEL SICKNESS 25 MG TAB C: 25 | 30 days supply | Qty: 30 | Fill #1 | Status: TO

## 2016-07-04 MED FILL — METOPROLOL TARTRATE 25 MG T: 25 | 30 days supply | Qty: 60 | Fill #0 | Status: TO

## 2016-07-04 MED FILL — hydrOXYzine HCL 25 MG TABS: 25 | 30 days supply | Qty: 90 | Fill #0

## 2016-07-04 NOTE — Progress Notes (Signed)
Bleeding vaginally- darker in color, cramping Medication refills

## 2016-07-04 NOTE — Progress Notes (Signed)
Subjective:  Patient ID: [name] [name], female    DOB: 08-Nov-1960  Age: 55 y.o. MRN: [id number]  CC: Hospitalization Follow-up; pulomonary embolism; and Post -Op Open Heart Surgery   HPI [name] [name] is a 55 year old female with a history of hypertension, morbid obesity, obstructive sleep apnea, chronic thromboembolic pulmonary hypertension (status post thrombo-embolectomy in 03/2016 currently on anticoagulation with Xarelto and followed by [facility] pulmonary) here for follow-up from hospitalization for possible stroke evaluation at [facility] from 06/26/16 through 06/28/16 due to right arm weakness and numbness after a fall   She had presented with right-sided weakness in her upper and lower extremity, decreased sensation in the right side of her face and involuntary twitching; CT head was negative for acute process. A repeat CT head performed in 24 hours ruled out a sstroke, xrays of right elbow and right hand were negative. Unable to obtain MRI due to body habitus. Was seen by occupational therapy during hospital stay. She was subsequently discharged with recommendations to follow-up with home health occupational therapy.  Today she informs me that her symptoms have improved significantly but her right arm "goes to sleep" and symptoms are relieved by flexing her right arm. She sometimes has shooting pain up to her right shoulder. Also suffers from some anxiety as she has custody of a young special needs child whom she was attempting to get to when she fell resulting in presentation for hospitalization.  Celexa does not help with her anxiety.  Past Medical History:  Diagnosis Date  . Arthritis   . Depression   . Diverticulitis   . Hypertension   . Obesity   . Pneumonia   . Pulmonary embolism [facility])     Past Surgical History:  Procedure Laterality Date  . ABDOMINAL HYSTERECTOMY    . BREAST REDUCTION SURGERY    . CHOLECYSTECTOMY    . EMBOLECTOMY N/A    pulmonary  embolectomy    No Known Allergies  Outpatient Medications Prior to Visit  Medication Sig Dispense Refill  . albuterol (PROVENTIL HFA;VENTOLIN HFA) 108 (90 Base) MCG/ACT inhaler Inhale 1-2 puffs into the lungs every 6 (six) hours as needed for wheezing or shortness of breath. 6.7 g 5  . amLODipine (NORVASC) 5 MG tablet Take 1 tablet (5 mg total) by mouth daily. 90 tablet 3  . aspirin 81 MG chewable tablet Chew 81 mg by mouth daily.    . citalopram (CELEXA) 40 MG tablet Take 1 tablet (40 mg total) by mouth daily. Reported on 02/20/2016 90 tablet 3  . diphenhydramine-acetaminophen (TYLENOL PM) 25-500 MG TABS tablet Take 1 tablet by mouth at bedtime as needed (sleep).    . docusate sodium (COLACE) 100 MG capsule Take 100 mg by mouth 2 (two) times daily as needed for mild constipation.     . gabapentin (NEURONTIN) 100 MG capsule Take 1 capsule (100 mg total) by mouth every 12 (twelve) hours as needed. (Patient taking differently: Take 100 mg by mouth every 12 (twelve) hours as needed (nerve pain). ) 90 capsule 3  . potassium chloride (K-DUR) 10 MEQ tablet Take 1 tablet (10 mEq total) by mouth 2 (two) times daily. 30 tablet 0  . rivaroxaban (XARELTO) 20 MG TABS tablet Take 1 tablet (20 mg total) by mouth daily with supper. 90 tablet 3  . triamcinolone cream (KENALOG) 0.1 % Apply 1 application topically 2 (two) times daily. For rash on left leg 30 g 0  . zaleplon (SONATA) 5 MG capsule Take 1 capsule (5  mg total) by mouth at bedtime as needed for sleep. 90 capsule 2  . atenolol (TENORMIN) 50 MG tablet Take 50 mg by mouth daily as needed (BP).     Marland Kitchen atorvastatin (LIPITOR) 20 MG tablet Take 1 tablet (20 mg total) by mouth daily at 6 PM. (Patient not taking: Reported on 07/04/2016) 30 tablet 0  . furosemide (LASIX) 40 MG tablet Take 1 tablet (40 mg total) by mouth daily. (Patient not taking: Reported on 07/04/2016) 30 tablet 0   No facility-administered medications prior to visit.     ROS Review of  Systems  Constitutional: Negative for activity change, appetite change and fatigue.  HENT: Negative for congestion, sinus pressure and sore throat.   Eyes: Negative for visual disturbance.  Respiratory: Negative for cough, chest tightness, shortness of breath and wheezing.   Cardiovascular: Negative for chest pain and palpitations.  Gastrointestinal: Negative for abdominal distention, abdominal pain and constipation.  Endocrine: Negative for polydipsia.  Genitourinary: Negative for dysuria and frequency.  Musculoskeletal: Negative for arthralgias and back pain.  Skin: Negative for rash.  Neurological: Positive for numbness. Negative for tremors and light-headedness.  Hematological: Does not bruise/bleed easily.  Psychiatric/Behavioral: Negative for agitation and behavioral problems.       Positive for anxiety    Objective:  BP 120/74 (BP Location: Right Arm, Patient Position: Sitting, Cuff Size: Large)   Pulse 88   Temp 98.1 F (36.7 C) (Oral)   Ht 5\' 3"  (1.6 m)   Wt (!) 309 lb (140.2 kg)   SpO2 97%   BMI 54.74 kg/m   BP/Weight 07/04/2016 06/28/2016 [id number]  Systolic BP [id number]  Diastolic BP 74 59 -  Wt. (Lbs) 309 - 310.2  BMI 54.74 - -      Physical Exam  Constitutional: She is oriented to person, place, and time. She appears well-developed and well-nourished.  Morbidly obese  Cardiovascular: Normal rate, normal heart sounds and intact distal pulses.   No murmur heard. Pulmonary/Chest: Effort normal and breath sounds normal. She has no wheezes. She has no rales. She exhibits no tenderness.  Abdominal: Soft. Bowel sounds are normal. She exhibits no distension and no mass. There is no tenderness.  Musculoskeletal: Normal range of motion. She exhibits no edema or tenderness.  Neurological: She is alert and oriented to person, place, and time.  Skin: Skin is warm and dry.  Psychiatric: She has a normal mood and affect.     Assessment & Plan:   1. CTEPH (chronic  thromboembolic pulmonary hypertension) Oxygen saturation is normal Currently on Xarelto Does have some occasional pink hemoptysis and pink vaginal spotting which could be secondary to use of anticoagulant Have discussed the risk versus benefit of being on the anticoagulant and she is to notify the clinic in the event that she has frank bleeding. Follows up with pulmonary at [facility]  2. Essential hypertension Pressure is controlled Patient complains of tachycardia with activity and so I will place her on metoprolol and have discontinued atenolol which she has not been taking and is currently on back order - metoprolol tartrate (LOPRESSOR) 25 MG tablet; Take 1 tablet (25 mg total) by mouth 2 (two) times daily.  Dispense: 60 tablet; Refill: 3  3. OSA on CPAP Stable  4. Impingement syndrome of right shoulder This could explain her right arm numbness and having to keep arm in a flexed position to relieve symptoms Exercise recommended Continue gabapentin  5. Anxiety state Exacerbated by caring for a special needs  child. She is requesting a prescription of short acting benzodiazepine which she has received in the past Discussed risk of dependence with this and so I am placing her on hydroxyzine which she will take in addition to Celexa. - hydrOXYzine (ATARAX/VISTARIL) 25 MG tablet; Take 1 tablet (25 mg total) by mouth 3 (three) times daily as needed.  Dispense: 90 tablet; Refill: 0  Completed form for handicap placard  Meds ordered this encounter  Medications  . hydrOXYzine (ATARAX/VISTARIL) 25 MG tablet    Sig: Take 1 tablet (25 mg total) by mouth 3 (three) times daily as needed.    Dispense:  90 tablet    Refill:  0  . metoprolol tartrate (LOPRESSOR) 25 MG tablet    Sig: Take 1 tablet (25 mg total) by mouth 2 (two) times daily.    Dispense:  60 tablet    Refill:  3    Discontinue atenolol    Follow-up: Return in about 1 month (around 08/04/2016) for follow up on anxiety with PCP.    Arnoldo Morale MD

## 2016-07-04 NOTE — Patient Instructions (Signed)
Generalized Anxiety Disorder Generalized anxiety disorder (GAD) is a mental disorder. It interferes with life functions, including relationships, work, and school. GAD is different from normal anxiety, which everyone experiences at some point in their lives in response to specific life events and activities. Normal anxiety actually helps us prepare for and get through these life events and activities. Normal anxiety goes away after the event or activity is over.  GAD causes anxiety that is not necessarily related to specific events or activities. It also causes excess anxiety in proportion to specific events or activities. The anxiety associated with GAD is also difficult to control. GAD can vary from mild to severe. People with severe GAD can have intense waves of anxiety with physical symptoms (panic attacks).  SYMPTOMS The anxiety and worry associated with GAD are difficult to control. This anxiety and worry are related to many life events and activities and also occur more days than not for 6 months or longer. People with GAD also have three or more of the following symptoms (one or more in children):  Restlessness.   Fatigue.  Difficulty concentrating.   Irritability.  Muscle tension.  Difficulty sleeping or unsatisfying sleep. DIAGNOSIS GAD is diagnosed through an assessment by your health care provider. Your health care provider will ask you questions aboutyour mood,physical symptoms, and events in your life. Your health care provider may ask you about your medical history and use of alcohol or drugs, including prescription medicines. Your health care provider may also do a physical exam and blood tests. Certain medical conditions and the use of certain substances can cause symptoms similar to those associated with GAD. Your health care provider may refer you to a mental health specialist for further evaluation. TREATMENT The following therapies are usually used to treat GAD:    Medication. Antidepressant medication usually is prescribed for long-term daily control. Antianxiety medicines may be added in severe cases, especially when panic attacks occur.   Talk therapy (psychotherapy). Certain types of talk therapy can be helpful in treating GAD by providing support, education, and guidance. A form of talk therapy called cognitive behavioral therapy can teach you healthy ways to think about and react to daily life events and activities.  Stress managementtechniques. These include yoga, meditation, and exercise and can be very helpful when they are practiced regularly. A mental health specialist can help determine which treatment is best for you. Some people see improvement with one therapy. However, other people require a combination of therapies.   This information is not intended to replace advice given to you by your health care provider. Make sure you discuss any questions you have with your health care provider.   Document Released: 12/21/2012 Document Revised: 09/16/2014 Document Reviewed: 12/21/2012 Elsevier Interactive Patient Education 2016 [employer]  

## 2016-07-05 ENCOUNTER — Other Ambulatory Visit: Payer: Self-pay

## 2016-07-05 DIAGNOSIS — G894 Chronic pain syndrome: Secondary | ICD-10-CM | POA: Diagnosis not present

## 2016-07-05 DIAGNOSIS — I2724 Chronic thromboembolic pulmonary hypertension: Secondary | ICD-10-CM | POA: Diagnosis not present

## 2016-07-05 DIAGNOSIS — R531 Weakness: Secondary | ICD-10-CM | POA: Diagnosis not present

## 2016-07-05 DIAGNOSIS — I10 Essential (primary) hypertension: Secondary | ICD-10-CM | POA: Diagnosis not present

## 2016-07-05 DIAGNOSIS — R0789 Other chest pain: Secondary | ICD-10-CM | POA: Diagnosis not present

## 2016-07-05 NOTE — Patient Outreach (Signed)
[facility]) Care Management  07/05/16  British Weinberg 1961-07-03 [id number]  Successful outreach completed with patient. Patient identification verified.  Patient was recently hospitalized 10/18-10/20 for right sided weakness (negative for CVA). Patient reported that she is doing pretty good. Stated that she now has home health with [facility] and the nurse came out yesterday. Stated that PT has also been out.  Patient reports that she was very pleased with her hospital admission at [facility]. She reports that her doctors were very attentive and told her that she cannot take care of anyone else until she takes care of herself. She voices that she understands this and realizes that she does need to focus on her own recovery. However, reports a lot of stress with the holidays coming up. She reports that she will need assistance with Christmas gifts for her grandson and called the [employer] but was told that she missed the deadline.   She is also concerned because she will need an appointment to get her port flushed. She reports that she did get her port flushed while in the hospital, but will need it done again in November. She stated that her doctor is supposed to be working on it, but she has not heard anything yet. RNCM advised will will wait until after home visit and if she has still not heard anything, can assist with getting order to have port flushed. Patient agreeable. She currently has no other questions or concerns.   Plan: Home visit scheduled for next week.  [name] Mc Hollen, RN, BSN, [place] Management Coordinator [phone]

## 2016-07-08 ENCOUNTER — Other Ambulatory Visit: Payer: Self-pay | Admitting: Internal Medicine

## 2016-07-09 DIAGNOSIS — G894 Chronic pain syndrome: Secondary | ICD-10-CM | POA: Diagnosis not present

## 2016-07-09 DIAGNOSIS — I2724 Chronic thromboembolic pulmonary hypertension: Secondary | ICD-10-CM | POA: Diagnosis not present

## 2016-07-09 DIAGNOSIS — R531 Weakness: Secondary | ICD-10-CM | POA: Diagnosis not present

## 2016-07-09 DIAGNOSIS — R0789 Other chest pain: Secondary | ICD-10-CM | POA: Diagnosis not present

## 2016-07-09 DIAGNOSIS — I10 Essential (primary) hypertension: Secondary | ICD-10-CM | POA: Diagnosis not present

## 2016-07-10 DIAGNOSIS — R0789 Other chest pain: Secondary | ICD-10-CM | POA: Diagnosis not present

## 2016-07-10 DIAGNOSIS — I10 Essential (primary) hypertension: Secondary | ICD-10-CM | POA: Diagnosis not present

## 2016-07-10 DIAGNOSIS — I2724 Chronic thromboembolic pulmonary hypertension: Secondary | ICD-10-CM | POA: Diagnosis not present

## 2016-07-10 DIAGNOSIS — R531 Weakness: Secondary | ICD-10-CM | POA: Diagnosis not present

## 2016-07-10 DIAGNOSIS — G894 Chronic pain syndrome: Secondary | ICD-10-CM | POA: Diagnosis not present

## 2016-07-11 ENCOUNTER — Other Ambulatory Visit: Payer: Self-pay

## 2016-07-11 DIAGNOSIS — G894 Chronic pain syndrome: Secondary | ICD-10-CM | POA: Diagnosis not present

## 2016-07-11 DIAGNOSIS — I2724 Chronic thromboembolic pulmonary hypertension: Secondary | ICD-10-CM | POA: Diagnosis not present

## 2016-07-11 DIAGNOSIS — I10 Essential (primary) hypertension: Secondary | ICD-10-CM | POA: Diagnosis not present

## 2016-07-11 DIAGNOSIS — R531 Weakness: Secondary | ICD-10-CM | POA: Diagnosis not present

## 2016-07-11 DIAGNOSIS — R0789 Other chest pain: Secondary | ICD-10-CM | POA: Diagnosis not present

## 2016-07-12 ENCOUNTER — Telehealth: Payer: Self-pay | Admitting: Internal Medicine

## 2016-07-12 NOTE — Telephone Encounter (Signed)
Don from [place] calling for verbal orders to evaluate pt next week due to unavailability of therapist

## 2016-07-15 NOTE — Telephone Encounter (Signed)
Don from [place] calling for verbal orders to evaluate pt this week for occupational therapy due to unavailability last week   Pt last saw Dr. Jarold Song

## 2016-07-15 NOTE — Telephone Encounter (Signed)
Called Don with [facility] and gave verbal lvm

## 2016-07-16 ENCOUNTER — Telehealth: Payer: Self-pay | Admitting: Internal Medicine

## 2016-07-16 DIAGNOSIS — R0789 Other chest pain: Secondary | ICD-10-CM | POA: Diagnosis not present

## 2016-07-16 DIAGNOSIS — I2724 Chronic thromboembolic pulmonary hypertension: Secondary | ICD-10-CM | POA: Diagnosis not present

## 2016-07-16 DIAGNOSIS — G894 Chronic pain syndrome: Secondary | ICD-10-CM | POA: Diagnosis not present

## 2016-07-16 DIAGNOSIS — R531 Weakness: Secondary | ICD-10-CM | POA: Diagnosis not present

## 2016-07-16 DIAGNOSIS — I10 Essential (primary) hypertension: Secondary | ICD-10-CM | POA: Diagnosis not present

## 2016-07-16 MED ORDER — LOPERAMIDE HCL 2 MG PO TABS: 2.0000 mg | ORAL_TABLET | Freq: Four times a day (QID) | ORAL | 0 refills | Status: DC | PRN

## 2016-07-16 NOTE — Telephone Encounter (Signed)
Will forward to pcp

## 2016-07-16 NOTE — Telephone Encounter (Signed)
Spoke with pt and made aware of rx and if not feeling better to make a f.u appointment

## 2016-07-16 NOTE — Telephone Encounter (Signed)
Don from [employer] called to inform PCP that pt has had diarrhea for two consecutive day and as well as nausea. Timmothy Sours is also asking for verbal approval on OT once a week for three weeks (transfer exercise, infection prevention,etc). Please follow up.   Thank you

## 2016-07-16 NOTE — Telephone Encounter (Signed)
No fevers? Maybe gi bug. rx imodium prescribed. If not better in 2-3 days, please make fu appt /add on w/ me.

## 2016-07-17 DIAGNOSIS — R0789 Other chest pain: Secondary | ICD-10-CM | POA: Diagnosis not present

## 2016-07-17 DIAGNOSIS — I2724 Chronic thromboembolic pulmonary hypertension: Secondary | ICD-10-CM | POA: Diagnosis not present

## 2016-07-17 DIAGNOSIS — G894 Chronic pain syndrome: Secondary | ICD-10-CM | POA: Diagnosis not present

## 2016-07-17 DIAGNOSIS — R531 Weakness: Secondary | ICD-10-CM | POA: Diagnosis not present

## 2016-07-17 DIAGNOSIS — I10 Essential (primary) hypertension: Secondary | ICD-10-CM | POA: Diagnosis not present

## 2016-07-18 ENCOUNTER — Encounter
Payer: Commercial Managed Care - HMO | Attending: Physical Medicine & Rehabilitation | Admitting: Physical Medicine & Rehabilitation

## 2016-07-18 ENCOUNTER — Encounter: Payer: Self-pay | Admitting: Physical Medicine & Rehabilitation

## 2016-07-18 VITALS — BP 111/67 | HR 58 | Resp 14

## 2016-07-18 DIAGNOSIS — Z7901 Long term (current) use of anticoagulants: Secondary | ICD-10-CM | POA: Insufficient documentation

## 2016-07-18 DIAGNOSIS — M170 Bilateral primary osteoarthritis of knee: Secondary | ICD-10-CM | POA: Insufficient documentation

## 2016-07-18 DIAGNOSIS — R269 Unspecified abnormalities of gait and mobility: Secondary | ICD-10-CM

## 2016-07-18 DIAGNOSIS — I10 Essential (primary) hypertension: Secondary | ICD-10-CM | POA: Insufficient documentation

## 2016-07-18 DIAGNOSIS — F329 Major depressive disorder, single episode, unspecified: Secondary | ICD-10-CM | POA: Diagnosis not present

## 2016-07-18 DIAGNOSIS — G479 Sleep disorder, unspecified: Secondary | ICD-10-CM

## 2016-07-18 DIAGNOSIS — G894 Chronic pain syndrome: Secondary | ICD-10-CM | POA: Diagnosis not present

## 2016-07-18 DIAGNOSIS — M1460 Charcot's joint, unspecified site: Secondary | ICD-10-CM | POA: Diagnosis not present

## 2016-07-18 DIAGNOSIS — Z86711 Personal history of pulmonary embolism: Secondary | ICD-10-CM | POA: Diagnosis not present

## 2016-07-18 DIAGNOSIS — G8929 Other chronic pain: Secondary | ICD-10-CM | POA: Insufficient documentation

## 2016-07-18 DIAGNOSIS — I2692 Saddle embolus of pulmonary artery without acute cor pulmonale: Secondary | ICD-10-CM

## 2016-07-18 MED ORDER — TRAMADOL HCL 50 MG PO TABS: 50.0000 mg | ORAL_TABLET | Freq: Three times a day (TID) | ORAL | 0 refills | Status: DC | PRN

## 2016-07-18 MED ORDER — TRAMADOL HCL 50 MG PO TABS: 50.0000 mg | ORAL_TABLET | Freq: Every day | ORAL | 0 refills | Status: DC | PRN

## 2016-07-18 MED ORDER — DULOXETINE HCL 60 MG PO CPEP: 60.0000 mg | ORAL_CAPSULE | Freq: Every day | ORAL | 1 refills | Status: DC

## 2016-07-18 MED ORDER — GABAPENTIN 300 MG PO CAPS: 300.0000 mg | ORAL_CAPSULE | Freq: Three times a day (TID) | ORAL | 1 refills | Status: DC

## 2016-07-18 NOTE — Addendum Note (Signed)
Addended by: Delice Lesch A on: 07/18/2016 03:43 PM   Modules accepted: Orders

## 2016-07-18 NOTE — Progress Notes (Signed)
Subjective:    Patient ID: [name] [name], female    DOB: June 28, 1961, 55 y.o.   MRN: [id number]  HPI 55 y/o with pmh/psh of OA of knees, saddle embolus HTN, depression, obesity, right ankle ORIF, b/l knee arthroscopy, presents with generalized pain, presents with generalized pain, >?knees.  The pain started ~2016.  No inciting events.  Rest improves the pain.  Activity exacerbates the pain.  Dull achy pain.  Non radiating.  Constant.  She denies associated numbness, tingling, weakness at present.  She has had steroid knee injections for only a day.  Last fall ~1 months ago after she forgot to lock her rollator.  Pain inhibits her from doing activity she enjoys and ADLs.   Pain Inventory Average Pain 10 Pain Right Now 9 My pain is sharp, burning, stabbing and aching  In the last 24 hours, has pain interfered with the following? General activity 9 Relation with others 8 Enjoyment of life 0 What TIME of day is your pain at its worst? all Sleep (in general) Poor  Pain is worse with: walking, bending, sitting, inactivity, standing and some activites Pain improves with: nothing Relief from Meds: 0  Mobility use a cane use a walker how many minutes can you walk? 5-10 ability to climb steps?  yes do you drive?  no Do you have any goals in this area?  yes  Function disabled: date disabled .  Neuro/Psych depression anxiety  Prior Studies Any changes since last visit?  no  Physicians involved in your care Any changes since last visit?  no   Family History  Problem Relation Age of Onset  . Lung cancer Maternal Grandmother    Social History   Social History  . Marital status: Married    Spouse name: N/A  . Number of children: N/A  . Years of education: N/A   Social History Main Topics  . Smoking status: Never Smoker  . Smokeless tobacco: Never Used  . Alcohol use No  . Drug use: No  . Sexual activity: Not Asked   Other Topics Concern  . None   Social History  Narrative  . None   Past Surgical History:  Procedure Laterality Date  . ABDOMINAL HYSTERECTOMY    . BREAST REDUCTION SURGERY    . CHOLECYSTECTOMY    . EMBOLECTOMY N/A    pulmonary embolectomy   Past Medical History:  Diagnosis Date  . Arthritis   . Depression   . Diverticulitis   . Hypertension   . Obesity   . Pneumonia   . Pulmonary embolism (HCC)    BP 111/67 (BP Location: Left Wrist, Patient Position: Sitting, Cuff Size: Large)   Pulse (!) 58   Resp 14   SpO2 96%   Opioid Risk Score:   Fall Risk Score:  `1  Depression screen PHQ 2/9  Depression screen [facility] 2/9 07/05/2016 07/05/2016 05/24/2016 05/17/2016 05/16/2016 04/25/2016 03/06/2016  Decreased Interest 1 1 2 2  0 3 3  Down, Depressed, Hopeless 2 2 2 2 1 2 3   PHQ - 2 Score 3 3 4 4 1 5 6   Altered sleeping 3 3 2 2 3 3 3   Tired, decreased energy 1 1 2 2 1 3 3   Change in appetite 1 1 2 2 1 1 2   Feeling bad or failure about yourself  0 0 2 2 1 1 2   Trouble concentrating 1 1 2 2 1 2 3   Moving slowly or fidgety/restless 0 0 1 0 0 0  3  Suicidal thoughts 0 0 0 0 0 0 0  PHQ-9 Score 9 9 15 14 8 15 22   Difficult doing work/chores - - Somewhat difficult Somewhat difficult - - -  Some recent data might be hidden    Review of Systems  HENT: Negative.   Eyes: Negative.   Respiratory: Negative.   Cardiovascular: Negative.   Gastrointestinal: Negative.   Endocrine: Negative.   Genitourinary: Negative.   Musculoskeletal: Positive for arthralgias, back pain, gait problem, joint swelling, myalgias, neck pain and neck stiffness.  Allergic/Immunologic: Negative.   Hematological: Negative.   Psychiatric/Behavioral: Positive for dysphoric mood. The patient is nervous/anxious.   All other systems reviewed and are negative.      Objective:   Physical Exam Gen: NAD. Vital signs reviewed HENT: Normocephalic, Atraumatic Eyes: EOMI.  No discharge Cardio: RRR. No JVD. Pulm: B/l clear to auscultation.  Effort normal Abd: Soft,  non-distended, non-tender, BS+ MSK:  Gait antalgic.   TTP diffusely in joints.    Pain at end ROM Neuro: CN II-XII grossly intact.    Sensation intact to light touch in all LE dermatomes  Reflexes 1+ throughout  Strength  4/5 in all LE myotomes (pain inhibition) Skin: Warm and Dry Psych: Tearful during exam    Assessment & Plan:  55 y/o with pmh/psh of OA of knees, saddle embolus HTN, depression, obesity, right ankle ORIF, b/l knee arthroscopy, presents with generalized pain, presents with generalized pain, >?knees.  1. Generalized chronic pain  She is currently in [facility] PT, encouraged pt to trial TENS with PT  Cont tylenol, up to 2000mg /day  Will refer to Rheumatology for autoimmune workup due to charcot joints with pain and coagulopathy  Will change Celexa to Cymbalta 60mg   Will increase Gabapentin to 300 TID  Will order Tramadol 50 daily PRN, educated on signs/symptoms of serotonin syndrome  Will consider Mobic 15mg  daily with food after completed Xarelto  2. Pulmonary embolus  S/p embolectomy  Cont Xarelto  Possibly continuing to neuropathic nerve pain  Gabapentin increased  3. Sleep disturbance  Gabapentin ordered  Cont other meds per PCP  Cont CPAP  Melatonin OTC  4. Abnormality of gait  Cont cane, rollator for safety  Cont therapies  5. Post-incisional pain  See #2  6. Morbid obesity  Encouraged weight loss, pt not interested in seeing a dietitian as she states she is doing well with weight loss  7. Depression   Cymbalta 60mg    Will refer to Psychology  >60 minutes spent with patient with >45 minutes in counseling regarding pain, clots, psychology referral and comforting/encouraging

## 2016-07-19 ENCOUNTER — Ambulatory Visit (HOSPITAL_COMMUNITY)
Admission: RE | Admit: 2016-07-19 | Discharge: 2016-07-19 | Disposition: A | Discharge status: Home / Self Care | Payer: Commercial Managed Care - HMO | Source: Ambulatory Visit | Attending: Internal Medicine | Admitting: Internal Medicine

## 2016-07-19 ENCOUNTER — Encounter (HOSPITAL_COMMUNITY): Payer: Self-pay

## 2016-07-19 DIAGNOSIS — Z452 Encounter for adjustment and management of vascular access device: Secondary | ICD-10-CM | POA: Insufficient documentation

## 2016-07-19 MED ORDER — SODIUM CHLORIDE 0.9% FLUSH
10.0000 mL | INTRAVENOUS | Status: DC | PRN
  Administered 2016-07-19: 10 mL

## 2016-07-19 MED ORDER — HEPARIN SOD (PORK) LOCK FLUSH 100 UNIT/ML IV SOLN
500.0000 [IU] | INTRAVENOUS | Status: DC | PRN
  Administered 2016-07-19: 500 [IU]
  Filled 2016-07-19: qty 5

## 2016-07-19 NOTE — Progress Notes (Signed)
This patient has a diagnosis of morbid obesity, present on admission on 06/27/2016, as supported by BMI greater than 40.

## 2016-07-19 NOTE — Progress Notes (Signed)
Pt here for monthly portacath flushing.  Accessed  With blood return, flushed with 53ml NS and 27ml heparin.  Port was deep and moved around easily.  Had to hold port to stabilize it upon accessing.  Pt states: "its a mover".  Pt tolerated well.  Pt given short stay policies sheet and also given next appoitment for December.  Pt was d/c ambulatory with family to lobby.

## 2016-07-22 DIAGNOSIS — R531 Weakness: Secondary | ICD-10-CM | POA: Diagnosis not present

## 2016-07-22 DIAGNOSIS — I10 Essential (primary) hypertension: Secondary | ICD-10-CM | POA: Diagnosis not present

## 2016-07-22 DIAGNOSIS — R0789 Other chest pain: Secondary | ICD-10-CM | POA: Diagnosis not present

## 2016-07-22 DIAGNOSIS — G894 Chronic pain syndrome: Secondary | ICD-10-CM | POA: Diagnosis not present

## 2016-07-22 DIAGNOSIS — I2724 Chronic thromboembolic pulmonary hypertension: Secondary | ICD-10-CM | POA: Diagnosis not present

## 2016-07-23 DIAGNOSIS — R0789 Other chest pain: Secondary | ICD-10-CM | POA: Diagnosis not present

## 2016-07-23 DIAGNOSIS — R531 Weakness: Secondary | ICD-10-CM | POA: Diagnosis not present

## 2016-07-23 DIAGNOSIS — G894 Chronic pain syndrome: Secondary | ICD-10-CM | POA: Diagnosis not present

## 2016-07-23 DIAGNOSIS — I10 Essential (primary) hypertension: Secondary | ICD-10-CM | POA: Diagnosis not present

## 2016-07-23 DIAGNOSIS — I2724 Chronic thromboembolic pulmonary hypertension: Secondary | ICD-10-CM | POA: Diagnosis not present

## 2016-07-25 DIAGNOSIS — G894 Chronic pain syndrome: Secondary | ICD-10-CM | POA: Diagnosis not present

## 2016-07-25 DIAGNOSIS — I2724 Chronic thromboembolic pulmonary hypertension: Secondary | ICD-10-CM | POA: Diagnosis not present

## 2016-07-25 DIAGNOSIS — I10 Essential (primary) hypertension: Secondary | ICD-10-CM | POA: Diagnosis not present

## 2016-07-25 DIAGNOSIS — R0789 Other chest pain: Secondary | ICD-10-CM | POA: Diagnosis not present

## 2016-07-25 DIAGNOSIS — R531 Weakness: Secondary | ICD-10-CM | POA: Diagnosis not present

## 2016-07-26 ENCOUNTER — Other Ambulatory Visit: Payer: Self-pay

## 2016-07-26 NOTE — Patient Outreach (Signed)
[name]) Care Management  07/26/16  [name] [name] 05-14-1961 [id number]  Successful outreach completed with patient. Patient identification verified. Patient stated that she is doing ok. Patient stated that she is still having the same chest pain and is worried about it. Patient requested a home visit next week to go over everything.   Plan: Home visit scheduled for next week.  [name] Neira Bentsen, RN, BSN, [place] Management Coordinator [phone]

## 2016-07-30 ENCOUNTER — Telehealth: Payer: Self-pay | Admitting: Internal Medicine

## 2016-07-30 DIAGNOSIS — R0789 Other chest pain: Secondary | ICD-10-CM | POA: Diagnosis not present

## 2016-07-30 DIAGNOSIS — R531 Weakness: Secondary | ICD-10-CM | POA: Diagnosis not present

## 2016-07-30 DIAGNOSIS — I10 Essential (primary) hypertension: Secondary | ICD-10-CM | POA: Diagnosis not present

## 2016-07-30 DIAGNOSIS — G894 Chronic pain syndrome: Secondary | ICD-10-CM | POA: Diagnosis not present

## 2016-07-30 DIAGNOSIS — I2724 Chronic thromboembolic pulmonary hypertension: Secondary | ICD-10-CM | POA: Diagnosis not present

## 2016-07-30 MED ORDER — ATORVASTATIN CALCIUM 20 MG PO TABS: 20.0000 mg | ORAL_TABLET | Freq: Every day | ORAL | 2 refills | Status: DC

## 2016-07-30 MED ORDER — POTASSIUM CHLORIDE ER 10 MEQ PO TBCR: 10.0000 meq | EXTENDED_RELEASE_TABLET | Freq: Two times a day (BID) | ORAL | 2 refills | Status: DC

## 2016-07-30 NOTE — Telephone Encounter (Signed)
Patient called the office to request medication refill for atorvastatin (LIPITOR) 20 MG tablet and  potassium chloride (K-DUR) 10 MEQ tablet . Please call rx to [facility] on [address].   Thank you.

## 2016-07-30 NOTE — Telephone Encounter (Signed)
Requested medications refilled 

## 2016-07-31 ENCOUNTER — Other Ambulatory Visit: Payer: Self-pay

## 2016-08-07 ENCOUNTER — Telehealth: Payer: Self-pay | Admitting: Internal Medicine

## 2016-08-07 NOTE — Telephone Encounter (Signed)
Will forward to pcp

## 2016-08-07 NOTE — Telephone Encounter (Signed)
Pain doctor would like patient to change depression medication.He would like patienr to take cymbalta and get off celexa. Patient needs advice on what to do. Please follow up.

## 2016-08-09 DIAGNOSIS — H5210 Myopia, unspecified eye: Secondary | ICD-10-CM | POA: Diagnosis not present

## 2016-08-09 DIAGNOSIS — H5213 Myopia, bilateral: Secondary | ICD-10-CM | POA: Diagnosis not present

## 2016-08-09 DIAGNOSIS — H524 Presbyopia: Secondary | ICD-10-CM | POA: Diagnosis not present

## 2016-08-09 DIAGNOSIS — H52209 Unspecified astigmatism, unspecified eye: Secondary | ICD-10-CM | POA: Diagnosis not present

## 2016-08-12 NOTE — Patient Outreach (Addendum)
[facility]) Care Management  [place]  07/31/2016   [name] [name] November 02, 1960 [id number]  Subjective: Home visit completed.   Objective:   Encounter Medications:  Outpatient Encounter Prescriptions as of 07/31/2016  Medication Sig Note  . albuterol (PROVENTIL HFA;VENTOLIN HFA) 108 (90 Base) MCG/ACT inhaler Inhale 1-2 puffs into the lungs every 6 (six) hours as needed for wheezing or shortness of breath.   Marland Kitchen amLODipine (NORVASC) 5 MG tablet Take 1 tablet (5 mg total) by mouth daily.   Marland Kitchen aspirin 81 MG chewable tablet Chew 81 mg by mouth daily.   Marland Kitchen atorvastatin (LIPITOR) 20 MG tablet Take 1 tablet (20 mg total) by mouth daily at 6 PM.   . diphenhydramine-acetaminophen (TYLENOL PM) 25-500 MG TABS tablet Take 1 tablet by mouth at bedtime as needed (sleep).   . docusate sodium (COLACE) 100 MG capsule Take 100 mg by mouth 2 (two) times daily as needed for mild constipation.    . furosemide (LASIX) 40 MG tablet Take 1 tablet (40 mg total) by mouth daily.   Marland Kitchen gabapentin (NEURONTIN) 300 MG capsule Take 1 capsule (300 mg total) by mouth 3 (three) times daily. 07/31/2016: Patient taking differently. Takes 1 capsule in the morning and others as needed.  . hydrOXYzine (ATARAX/VISTARIL) 25 MG tablet Take 1 tablet (25 mg total) by mouth 3 (three) times daily as needed.   . metoprolol tartrate (LOPRESSOR) 25 MG tablet Take 1 tablet (25 mg total) by mouth 2 (two) times daily.   . potassium chloride (K-DUR) 10 MEQ tablet Take 1 tablet (10 mEq total) by mouth 2 (two) times daily. 07/31/2016: Patient taking differently. Taking once a day.  . rivaroxaban (XARELTO) 20 MG TABS tablet Take 1 tablet (20 mg total) by mouth daily with supper.   . traMADol (ULTRAM) 50 MG tablet Take 1 tablet (50 mg total) by mouth daily as needed.   . triamcinolone cream (KENALOG) 0.1 % Apply 1 application topically 2 (two) times daily. For rash on left leg   . zaleplon (SONATA) 5 MG capsule Take 1  capsule (5 mg total) by mouth at bedtime as needed for sleep.   . DULoxetine (CYMBALTA) 60 MG capsule Take 1 capsule (60 mg total) by mouth daily. (Patient not taking: Reported on 07/31/2016) 07/31/2016: Patient has not yet filled this prescription. Wants to talk with PCP before changing antidepressant medication.  Marland Kitchen loperamide (IMODIUM A-D) 2 MG tablet Take 1 tablet (2 mg total) by mouth 4 (four) times daily as needed for diarrhea or loose stools. (Patient not taking: Reported on 07/31/2016)    No facility-administered encounter medications on file as of 07/31/2016.     Functional Status:  In your present state of health, do you have any difficulty performing the following activities: 06/27/2016 05/24/2016  Hearing? N N  Vision? N N  Difficulty concentrating or making decisions? N Y  Walking or climbing stairs? Y Y  Dressing or bathing? Y Y  Doing errands, shopping? Y Y  Conservation officer, nature and eating ? - N  Using the Toilet? - N  In the past six months, have you accidently leaked urine? - N  Do you have problems with loss of bowel control? - N  Managing your Medications? - N  Managing your Finances? - Y  Housekeeping or managing your Housekeeping? - Y  Some recent data might be hidden    Fall/Depression Screening: PHQ 2/9 Scores 07/05/2016 07/05/2016 05/24/2016 05/17/2016 05/16/2016 04/25/2016 03/06/2016  PHQ - 2 Score 3  3 4 4 1 5 6   PHQ- 9 Score 9 9 15 14 8 15 22     Assessment: Patient is doing well today. She does continue to complain of ongoing pain. Today, she rated her pain 8/10 and reported that it is never below a 7. She stated that when she had a prescription for oxycodone, it would come down to a 5/10. She has a prescription for ultram and stated that she only takes it once a day and then takes 2 tylenol in order to help with her pain. She stated that her doctor has referred her to a rheumatologist and wants her to start talking to a therapist. She reported that she has done this in the  past and she feels like she is ready to do it again. She reported that she understands she is under a lot of pressure with her health issues, and this concerns her because she and her husband are the primary caregivers for their 64 year old grandson.   Patient reported that she needs knee replacement surgery, but she is concerned to ever have another surgery or procedure that requires her to be out to sleep. She reported that when she had her last surgery, they had a very difficult time getting her breathing tube in. She stated that it took over an hour to get it inserted. She reported that because of this, she was told she should always tell her doctors and carry the hospital with her. RNCM educated that she should always keep this card on her and to make sure her family knows and can tell EMS as well in the event they have to call 911 and she is unable to speak for herself. Advised that sometimes they have to begin the process of putting in a breathing tube in the ambulance and that would be important information for them to have. Patient verbalized understanding.   Patient has an appointment on 12/10 to have her port flushed. She stated she finally has a standing order to have this done at [facility] oncology department every month, so she does not have to worry about not getting this done.   Patient has no other questions or concerns.   Plan: Will continue to follow as patient completes new referral appointments. Patient encouraged to call with any questions or concerns.  [name] R. Hamda Klutts, RN, BSN, [place] Management Coordinator [phone]

## 2016-08-12 NOTE — Patient Outreach (Signed)
[place] [facility]) Care Management  [facility] Care Manager  Late entry for 07/11/2016   [name] [name] [birth year] [id number]  Subjective: Home visit completed with patient.   Objective:  Vitals:   07/11/16 0930  BP: 120/78  Pulse: 66  Resp: 20     Encounter Medications:  Outpatient Encounter Prescriptions as of 07/11/2016  Medication Sig  . albuterol (PROVENTIL HFA;VENTOLIN HFA) 108 (90 Base) MCG/ACT inhaler Inhale 1-2 puffs into the lungs every 6 (six) hours as needed for wheezing or shortness of breath.  Marland Kitchen amLODipine (NORVASC) 5 MG tablet Take 1 tablet (5 mg total) by mouth daily.  Marland Kitchen aspirin 81 MG chewable tablet Chew 81 mg by mouth daily.  . diphenhydramine-acetaminophen (TYLENOL PM) 25-500 MG TABS tablet Take 1 tablet by mouth at bedtime as needed (sleep).  . docusate sodium (COLACE) 100 MG capsule Take 100 mg by mouth 2 (two) times daily as needed for mild constipation.   . furosemide (LASIX) 40 MG tablet Take 1 tablet (40 mg total) by mouth daily.  . hydrOXYzine (ATARAX/VISTARIL) 25 MG tablet Take 1 tablet (25 mg total) by mouth 3 (three) times daily as needed.  . metoprolol tartrate (LOPRESSOR) 25 MG tablet Take 1 tablet (25 mg total) by mouth 2 (two) times daily.  . rivaroxaban (XARELTO) 20 MG TABS tablet Take 1 tablet (20 mg total) by mouth daily with supper.  . triamcinolone cream (KENALOG) 0.1 % Apply 1 application topically 2 (two) times daily. For rash on left leg  . zaleplon (SONATA) 5 MG capsule Take 1 capsule (5 mg total) by mouth at bedtime as needed for sleep.  . [DISCONTINUED] atorvastatin (LIPITOR) 20 MG tablet Take 1 tablet (20 mg total) by mouth daily at 6 PM.  . [DISCONTINUED] citalopram (CELEXA) 40 MG tablet Take 1 tablet (40 mg total) by mouth daily. Reported on 02/20/2016  . [DISCONTINUED] gabapentin (NEURONTIN) 100 MG capsule Take 1 capsule (100 mg total) by mouth every 12 (twelve) hours as needed. (Patient taking differently: Take 100 mg by mouth  every 12 (twelve) hours as needed (nerve pain). )  . [DISCONTINUED] potassium chloride (K-DUR) 10 MEQ tablet Take 1 tablet (10 mEq total) by mouth 2 (two) times daily.   No facility-administered encounter medications on file as of 07/11/2016.     Functional Status:  In your present state of health, do you have any difficulty performing the following activities: 06/27/2016 05/24/2016  Hearing? N N  Vision? N N  Difficulty concentrating or making decisions? N Y  Walking or climbing stairs? Y Y  Dressing or bathing? Y Y  Doing errands, shopping? Y Y  Conservation officer, nature and eating ? - N  Using the Toilet? - N  In the past six months, have you accidently leaked urine? - N  Do you have problems with loss of bowel control? - N  Managing your Medications? - N  Managing your Finances? - Y  Housekeeping or managing your Housekeeping? - Y  Some recent data might be hidden    Fall/Depression Screening: PHQ 2/9 Scores 07/05/2016 07/05/2016 05/24/2016 05/17/2016 05/16/2016 04/25/2016 03/06/2016  PHQ - 2 Score 3 3 4 4 1 5 6   PHQ- 9 Score 9 9 15 14 8 15 22     Assessment: Patient stated that she is concerned because she is having chest pain at all times in the right side of her chest. She stated that this has been ongoing and it feels like a piece of steel is pressing up against her  chest. She reports it feels like sharpness and it is worse on the right side than on the left. She reports it happens suddenly and causes her to have to stop talking and sit for a few minutes until the pain passes. She reported that she has talked with her doctors and they keep telling her this is normal. She stated that she has an appointment with pain management on 07/18/2016. Currently, she is taking Tylenol regularly every morning and then as needed throughout the day. RNCM educated patient to observe for any changes to the pain she is experiencing and be sure to report any worsening or changes to the kind of pain she is having.   Respirations even and unlabored. Lung sounds clear. Heart rate normal with normal rhythm.  Patient reported that she had her flu shot in September and is scheduled to have her port flushed on 07/19/2016. Patient continues to have concerns about caring for her grandson. She reported that she called [facility] and they are sending someone for intake today. She reported that they need intensive home therapy. Jessica with [facility] is coming out today at 1 pm. Patient reported that she saw blood in her stool x 1 and coughed up something blood-tinged. She stated that she has seen her doctor as well and was told that if it becomes bright red to go to the emergency room.   Patient currently has no other questions or concerns.  Plan: RNCM will continue to follow for needs. Patient to go to pain clinic for pain management 11/9.  [name] Sharmila Wrobleski, RN, BSN, [place] Management Coordinator [phone]

## 2016-08-12 NOTE — Telephone Encounter (Signed)
I think switching from celexa to cymbalta is a reasonable idea. cymbalta will treat depression /anxiety, but also good for fibromyalgia and other musculoskeletal pain. thanks

## 2016-08-14 ENCOUNTER — Encounter: Payer: Self-pay | Admitting: Internal Medicine

## 2016-08-14 ENCOUNTER — Ambulatory Visit: Payer: Commercial Managed Care - HMO | Attending: Internal Medicine | Admitting: Internal Medicine

## 2016-08-14 VITALS — BP 138/80 | HR 73 | Temp 98.1°F | Resp 16 | Wt 314.8 lb

## 2016-08-14 DIAGNOSIS — F411 Generalized anxiety disorder: Secondary | ICD-10-CM | POA: Insufficient documentation

## 2016-08-14 DIAGNOSIS — I2782 Chronic pulmonary embolism: Secondary | ICD-10-CM | POA: Diagnosis not present

## 2016-08-14 DIAGNOSIS — I10 Essential (primary) hypertension: Secondary | ICD-10-CM | POA: Insufficient documentation

## 2016-08-14 DIAGNOSIS — F329 Major depressive disorder, single episode, unspecified: Secondary | ICD-10-CM | POA: Insufficient documentation

## 2016-08-14 DIAGNOSIS — I2724 Chronic thromboembolic pulmonary hypertension: Secondary | ICD-10-CM | POA: Diagnosis not present

## 2016-08-14 DIAGNOSIS — I272 Pulmonary hypertension, unspecified: Secondary | ICD-10-CM | POA: Insufficient documentation

## 2016-08-14 DIAGNOSIS — G4733 Obstructive sleep apnea (adult) (pediatric): Secondary | ICD-10-CM | POA: Insufficient documentation

## 2016-08-14 DIAGNOSIS — M1460 Charcot's joint, unspecified site: Secondary | ICD-10-CM

## 2016-08-14 DIAGNOSIS — G894 Chronic pain syndrome: Secondary | ICD-10-CM | POA: Insufficient documentation

## 2016-08-14 DIAGNOSIS — Z7901 Long term (current) use of anticoagulants: Secondary | ICD-10-CM | POA: Insufficient documentation

## 2016-08-14 DIAGNOSIS — Z7982 Long term (current) use of aspirin: Secondary | ICD-10-CM | POA: Insufficient documentation

## 2016-08-14 DIAGNOSIS — Z9989 Dependence on other enabling machines and devices: Secondary | ICD-10-CM

## 2016-08-14 MED ORDER — ATORVASTATIN CALCIUM 20 MG PO TABS: 20.0000 mg | ORAL_TABLET | Freq: Every day | ORAL | 2 refills | Status: DC

## 2016-08-14 MED ORDER — AMLODIPINE BESYLATE 5 MG PO TABS: 5.0000 mg | ORAL_TABLET | Freq: Every day | ORAL | 3 refills | Status: DC

## 2016-08-14 MED ORDER — METOPROLOL TARTRATE 25 MG PO TABS: 25.0000 mg | ORAL_TABLET | Freq: Two times a day (BID) | ORAL | 3 refills | Status: DC

## 2016-08-14 MED ORDER — ASPIRIN 81 MG PO CHEW: 81.0000 mg | CHEWABLE_TABLET | Freq: Every day | ORAL | 3 refills | Status: DC

## 2016-08-14 MED ORDER — ACETAMINOPHEN-CODEINE #3 300-30 MG PO TABS: 1.0000 | ORAL_TABLET | Freq: Three times a day (TID) | ORAL | 0 refills | Status: DC | PRN

## 2016-08-14 MED ORDER — RIVAROXABAN 20 MG PO TABS: 20.0000 mg | ORAL_TABLET | Freq: Every day | ORAL | 3 refills | Status: DC

## 2016-08-14 MED ORDER — DICLOFENAC SODIUM 1 % TD GEL: 2.0000 g | Freq: Four times a day (QID) | TRANSDERMAL | 2 refills | Status: DC

## 2016-08-14 NOTE — Addendum Note (Signed)
Addended byLottie Mussel T on: 08/14/2016 01:39 PM   Modules accepted: Orders

## 2016-08-14 NOTE — Patient Instructions (Signed)
Generalized Anxiety Disorder Generalized anxiety disorder (GAD) is a mental disorder. It interferes with life functions, including relationships, work, and school. GAD is different from normal anxiety, which everyone experiences at some point in their lives in response to specific life events and activities. Normal anxiety actually helps [place] prepare for and get through these life events and activities. Normal anxiety goes away after the event or activity is over.  GAD causes anxiety that is not necessarily related to specific events or activities. It also causes excess anxiety in proportion to specific events or activities. The anxiety associated with GAD is also difficult to control. GAD can vary from mild to severe. People with severe GAD can have intense waves of anxiety with physical symptoms (panic attacks).  SYMPTOMS The anxiety and worry associated with GAD are difficult to control. This anxiety and worry are related to many life events and activities and also occur more days than not for 6 months or longer. People with GAD also have three or more of the following symptoms (one or more in children):  Restlessness.   Fatigue.  Difficulty concentrating.   Irritability.  Muscle tension.  Difficulty sleeping or unsatisfying sleep. DIAGNOSIS GAD is diagnosed through an assessment by your health care provider. Your health care provider will ask you questions aboutyour mood,physical symptoms, and events in your life. Your health care provider may ask you about your medical history and use of alcohol or drugs, including prescription medicines. Your health care provider may also do a physical exam and blood tests. Certain medical conditions and the use of certain substances can cause symptoms similar to those associated with GAD. Your health care provider may refer you to a mental health specialist for further evaluation. TREATMENT The following therapies are usually used to treat GAD:    Medication. Antidepressant medication usually is prescribed for long-term daily control. Antianxiety medicines may be added in severe cases, especially when panic attacks occur.   Talk therapy (psychotherapy). Certain types of talk therapy can be helpful in treating GAD by providing support, education, and guidance. A form of talk therapy called cognitive behavioral therapy can teach you healthy ways to think about and react to daily life events and activities.  Stress managementtechniques. These include yoga, meditation, and exercise and can be very helpful when they are practiced regularly. A mental health specialist can help determine which treatment is best for you. Some people see improvement with one therapy. However, other people require a combination of therapies. This information is not intended to replace advice given to you by your health care provider. Make sure you discuss any questions you have with your health care provider. Document Released: 12/21/2012 Document Revised: 09/16/2014 Document Reviewed: 12/21/2012 Elsevier Interactive Patient Education  2017 Elsevier Inc.  -  Chronic Pain, Adult Chronic pain is a type of pain that lasts or keeps coming back (recurs) for at least six months. You may have chronic headaches, abdominal pain, or body pain. Chronic pain may be related to an illness, such as fibromyalgia or complex regional pain syndrome. Sometimes the cause of chronic pain is not known. Chronic pain can make it hard for you to do daily activities. If not treated, chronic pain can lead to other health problems, including anxiety and depression. Treatment depends on the cause and severity of your pain. You may need to work with a pain specialist to come up with a treatment plan. The plan may include medicine, counseling, and physical therapy. Many people benefit from a combination  of two or more types of treatment to control their pain. Follow these instructions at  home: Lifestyle  Consider keeping a pain diary to share with your health care providers.  Consider talking with a mental health care provider (psychologist) about how to cope with chronic pain.  Consider joining a chronic pain support group.  Try to control or lower your stress levels. Talk to your health care provider about strategies to do this. General instructions  Take over-the-counter and prescription medicines only as told by your health care provider.  Follow your treatment plan as told by your health care provider. This may include:  Gentle, regular exercise.  Eating a healthy diet that includes foods such as vegetables, fruits, fish, and lean meats.  Cognitive or behavioral therapy.  Working with a Community education officer.  Meditation or yoga.  Acupuncture or massage therapy.  Aroma, color, light, or sound therapy.  Local electrical stimulation.  Shots (injections) of numbing or pain-relieving medicines into the spine or the area of pain.  Check your pain level as told by your health care provider. Ask your health care provider if you should use a pain scale.  Learn as much as you can about how to manage your chronic pain. Ask your health care provider if an intensive pain rehabilitation program or a chronic pain specialist would be helpful.  Keep all follow-up visits as told by your health care provider. This is important. Contact a health care provider if:  Your pain gets worse.  You have new pain.  You have trouble sleeping.  You have trouble doing your normal activities.  Your pain is not controlled with treatment.  Your have side effects from pain medicine.  You feel weak. Get help right away if:  You lose feeling or have numbness in your body.  You lose control of bowel or bladder function.  Your pain suddenly gets much worse.  You develop shaking or chills.  You develop confusion.  You develop chest pain.  You have trouble breathing or  shortness of breath.  You pass out.  You have thoughts about hurting yourself or others. This information is not intended to replace advice given to you by your health care provider. Make sure you discuss any questions you have with your health care provider. Document Released: 05/18/2002 Document Revised: 04/25/2016 Document Reviewed: 02/13/2016 Elsevier Interactive Patient Education  2017 [place].  -  Neuropathic Pain Introduction Neuropathic pain is pain caused by damage to the nerves that are responsible for certain sensations in your body (sensory nerves). The pain can be caused by damage to:  The sensory nerves that send signals to your spinal cord and brain (peripheral nervous system).  The sensory nerves in your brain or spinal cord (central nervous system). Neuropathic pain can make you more sensitive to pain. What would be a minor sensation for most people may feel very painful if you have neuropathic pain. This is usually a long-term condition that can be difficult to treat. The type of pain can differ from person to person. It may start suddenly (acute), or it may develop slowly and last for a long time (chronic). Neuropathic pain may come and go as damaged nerves heal or may stay at the same level for years. It often causes emotional distress, loss of sleep, and a lower quality of life. What are the causes? The most common cause of damage to a sensory nerve is diabetes. Many other diseases and conditions can also cause neuropathic pain. Causes  of neuropathic pain can be classified as:  Toxic. Many drugs and chemicals can cause toxic damage. The most common cause of toxic neuropathic pain is damage from drug treatment for cancer (chemotherapy).  Metabolic. This type of pain can happen when a disease causes imbalances that damage nerves. Diabetes is the most common of these diseases. Vitamin B deficiency caused by long-term alcohol abuse is another common cause.  Traumatic.  Any injury that cuts, crushes, or stretches a nerve can cause damage and pain. A common example is feeling pain after losing an arm or leg (phantom limb pain).  Compression-related. If a sensory nerve gets trapped or compressed for a long period of time, the blood supply to the nerve can be cut off.  Vascular. Many blood vessel diseases can cause neuropathic pain by decreasing blood supply and oxygen to nerves.  Autoimmune. This type of pain results from diseases in which the body's defense system mistakenly attacks sensory nerves. Examples of autoimmune diseases that can cause neuropathic pain include lupus and multiple sclerosis.  Infectious. Many types of viral infections can damage sensory nerves and cause pain. Shingles infection is a common cause of this type of pain.  Inherited. Neuropathic pain can be a symptom of many diseases that are passed down through families (genetic). What are the signs or symptoms? The main symptom is pain. Neuropathic pain is often described as:  Burning.  Shock-like.  Stinging.  Hot or cold.  Itching. How is this diagnosed? No single test can diagnose neuropathic pain. Your health care provider will do a physical exam and ask you about your pain. You may use a pain scale to describe how bad your pain is. You may also have tests to see if you have a high sensitivity to pain and to help find the cause and location of any sensory nerve damage. These tests may include:  Imaging studies, such as:  X-rays.  CT scan.  MRI.  Nerve conduction studies to test how well nerve signals travel through your sensory nerves (electrodiagnostic testing).  Stimulating your sensory nerves through electrodes on your skin and measuring the response in your spinal cord and brain (somatosensory evoked potentials). How is this treated? Treatment for neuropathic pain may change over time. You may need to try different treatment options or a combination of treatments.  Some options include:  Over-the-counter pain relievers.  Prescription medicines. Some medicines used to treat other conditions may also help neuropathic pain. These include medicines to:  Control seizures (anticonvulsants).  Relieve depression (antidepressants).  Prescription-strength pain relievers (narcotics). These are usually used when other pain relievers do not help.  Transcutaneous nerve stimulation (TENS). This uses electrical currents to block painful nerve signals. The treatment is painless.  Topical and local anesthetics. These are medicines that numb the nerves. They can be injected as a nerve block or applied to the skin.  Alternative treatments, such as:  Acupuncture.  Meditation.  Massage.  Physical therapy.  Pain management programs.  Counseling. Follow these instructions at home:  Learn as much as you can about your condition.  Take medicines only as directed by your health care provider.  Work closely with all your health care providers to find what works best for you.  Have a good support system at home.  Consider joining a chronic pain support group. Contact a health care provider if:  Your pain treatments are not helping.  You are having side effects from your medicines.  You are struggling with fatigue, mood changes,  depression, or anxiety. This information is not intended to replace advice given to you by your health care provider. Make sure you discuss any questions you have with your health care provider. Document Released: 05/23/2004 Document Revised: 03/15/2016 Document Reviewed: 02/03/2014  2017 Elsevier

## 2016-08-14 NOTE — Progress Notes (Signed)
[name] [name], is a 55 y.o. female  [id number]  [id number]  DOB - [birth year]  Chief Complaint  Patient presents with  . Follow-up        Subjective:   [name] [name] is a 55 y.o. female here today for a follow up visit for htn, chronic pe w/ pulm htn, osa compliant on cpap, anxiety/depression, chronic pain syndrome, fibromyalgia, likely. Last seen in our clinic 10/26, since than has been to see PMR, who also referred her to psychiatry and rheum (for charcot joints).    Pt has not started her cymbalta yet, wanted to talk to me first. She states the tylenol is not helping her pain much, and when she takes the ultram, it gives her a "head rush" for 15 mins, goes away, but her body pains are still there.  She c/o of chest pains, thinks postsurg in nature, worse w/ deep inhalation at times, and feels like someone is "stabing her neck".  Taking higher dose of neurontin has not seem to help her pains either.  She has been close to going to ER several times but does not want to be thought of as crazy or a drug addict.  Tearful throughout explanation to me.   Patient has No headache,  No abdominal pain - No Nausea, No new weakness tingling or numbness, No Cough - SOB.   She is here w/ her husband today, who is also a patient of mine.  Problem  Gad (Generalized Anxiety Disorder)  Morbid Obesity ([facility])    ALLERGIES: No Known Allergies  PAST MEDICAL HISTORY: Past Medical History:  Diagnosis Date  . Arthritis   . Depression   . Diverticulitis   . Hypertension   . Obesity   . Pneumonia   . Pulmonary embolism ([place])     MEDICATIONS AT HOME: Prior to Admission medications   Medication Sig Start Date End Date Taking? Authorizing Provider  acetaminophen-codeine (TYLENOL #3) 300-30 MG tablet Take 1 tablet by mouth every 8 (eight) hours as needed for moderate pain. Max total Tyelnol < 2000mg  /day 08/14/16   Maren Reamer, MD  albuterol (PROVENTIL HFA;VENTOLIN HFA) 108 (90 Base)  MCG/ACT inhaler Inhale 1-2 puffs into the lungs every 6 (six) hours as needed for wheezing or shortness of breath. 02/08/16   Maryan Puls, MD  amLODipine (NORVASC) 5 MG tablet Take 1 tablet (5 mg total) by mouth daily. 08/14/16   Maren Reamer, MD  aspirin 81 MG chewable tablet Chew 1 tablet (81 mg total) by mouth daily. 08/14/16   Maren Reamer, MD  atorvastatin (LIPITOR) 20 MG tablet Take 1 tablet (20 mg total) by mouth daily at 6 PM. 08/14/16   Maren Reamer, MD  diclofenac sodium (VOLTAREN) 1 % GEL Apply 2 g topically 4 (four) times daily. 08/14/16   Maren Reamer, MD  diphenhydramine-acetaminophen (TYLENOL PM) 25-500 MG TABS tablet Take 1 tablet by mouth at bedtime as needed (sleep).    Historical Provider, MD  docusate sodium (COLACE) 100 MG capsule Take 100 mg by mouth 2 (two) times daily as needed for mild constipation.     Historical Provider, MD  DULoxetine (CYMBALTA) 60 MG capsule Take 1 capsule (60 mg total) by mouth daily. Patient not taking: Reported on 07/31/2016 07/18/16   Ankit Lorie Phenix, MD  furosemide (LASIX) 40 MG tablet Take 1 tablet (40 mg total) by mouth daily. 06/28/16   Florencia Reasons, MD  gabapentin (NEURONTIN) 300 MG capsule Take 1 capsule (300 mg  total) by mouth 3 (three) times daily. 07/18/16 08/17/16  Ankit Lorie Phenix, MD  hydrOXYzine (ATARAX/VISTARIL) 25 MG tablet Take 1 tablet (25 mg total) by mouth 3 (three) times daily as needed. 07/04/16   Arnoldo Morale, MD  loperamide (IMODIUM A-D) 2 MG tablet Take 1 tablet (2 mg total) by mouth 4 (four) times daily as needed for diarrhea or loose stools. Patient not taking: Reported on 07/31/2016 07/16/16   Maren Reamer, MD  metoprolol tartrate (LOPRESSOR) 25 MG tablet Take 1 tablet (25 mg total) by mouth 2 (two) times daily. 08/14/16   Maren Reamer, MD  potassium chloride (K-DUR) 10 MEQ tablet Take 1 tablet (10 mEq total) by mouth 2 (two) times daily. 07/30/16   Maren Reamer, MD  rivaroxaban (XARELTO) 20 MG TABS tablet  Take 1 tablet (20 mg total) by mouth daily with supper. 08/14/16   Maren Reamer, MD  triamcinolone cream (KENALOG) 0.1 % Apply 1 application topically 2 (two) times daily. For rash on left leg 05/15/16   Daleen Bo, MD  zaleplon (SONATA) 5 MG capsule Take 1 capsule (5 mg total) by mouth at bedtime as needed for sleep. 04/30/16   Maren Reamer, MD     Objective:   Vitals:   08/14/16 0955  BP: 138/80  Pulse: 73  Resp: 16  Temp: 98.1 F (36.7 C)  TempSrc: Oral  SpO2: 96%  Weight: (!) 314 lb 12.8 oz (142.8 kg)    Exam General appearance : Awake, alert, not in any distress. Speech Clear. Not toxic looking HEENT: Atraumatic and Normocephalic, pupils equally reactive to light. Neck: supple, no JVD. No cervical lymphadenopathy.  Chest:Good air entry bilaterally, no added sounds. CVS: S1 S2 regular, no murmurs/gallups or rubs. Abdomen: Bowel sounds active, Non tender and not distended with no gaurding, rigidity or rebound. Extremities: B/L Lower Ext shows no edema, both legs are warm to touch Neurology: Awake alert, and oriented X 3, CN II-XII grossly intact, Non focal Skin:No Rash  Data Review Lab Results  Component Value Date   HGBA1C 5.6 06/27/2016    Depression screen [facility] 2/9 08/14/2016 07/05/2016 07/05/2016 05/24/2016 05/17/2016  Decreased Interest 1 1 1 2 2   Down, Depressed, Hopeless 1 2 2 2 2   PHQ - 2 Score 2 3 3 4 4   Altered sleeping 2 3 3 2 2   Tired, decreased energy 1 1 1 2 2   Change in appetite 0 1 1 2 2   Feeling bad or failure about yourself  0 0 0 2 2  Trouble concentrating 0 1 1 2 2   Moving slowly or fidgety/restless 0 0 0 1 0  Suicidal thoughts 0 0 0 0 0  PHQ-9 Score 5 9 9 15 14   Difficult doing work/chores - - - Somewhat difficult Somewhat difficult  Some recent data might be hidden      Assessment & Plan   1. Essential hypertension Controlled,  - metoprolol tartrate (LOPRESSOR) 25 MG tablet; Take 1 tablet (25 mg total) by mouth 2 (two) times daily.   Dispense: 60 tablet; Refill: 3  2. CTEPH (chronic thromboembolic pulmonary hypertension) Per pulm, sees [facility]  3. Chronic pain syndrome, w/ likely postsurg pain w/ neuropathy and fibromyalgia, anxiety/depresison complicating pain. - not tol of ultram, dc'd - trial tylenol #3, instructed pt to keep total tylenol intake 000mg  and she understands - rheum referral pending, [place] rheum has declined her referral, sent not to referral specialist to see if could be evaluated in [place]  per pt request. - renewed neurontin, voltarin gel (which has helped her prior),  - encouraged her to start cymbalta, she has picked up the rx, and wean down on celexa - pain contract signed today, but if PMR or Rheum ends up increasing her rx, than we will null our contract.  4. Reactive depression - encouraged her to start cymbalta, she has picked up the rx, and wean down on celexa - recd weaning down on celexa// taking 1/2 tab celexa for next 4-5 days, than stopping while she starts cymbalta - ref to psyche pending  5. GAD (generalized anxiety disorder) See #4,  Has been taking atarax sparingly., recd reduce use to prevent dependence if able - will ask SW Delana Meyer to f/u w/ her as well.  6. OSA on CPAP encouraged use of cpap  7. Morbid obesity ([place]) Weight loss recd,  She was 278 lbs  05/16/16, now 314. Wants to do it on own.  8. Has port, Now scheduled at [facility] for monthly port flushes.   Patient have been counseled extensively about nutrition and exercise  Return in about 3 months (around 11/12/2016), or if symptoms worsen or fail to improve.  The patient was given clear instructions to go to ER or return to medical center if symptoms don't improve, worsen or new problems develop. The patient verbalized understanding. The patient was told to call to get lab results if they haven't heard anything in the next week.   This note has been created with Administrator. Any transcriptional errors are unintentional.   Maren Reamer, MD, [facility] [place], [place]   08/14/2016, 10:32 AM

## 2016-08-16 ENCOUNTER — Telehealth: Payer: Self-pay | Admitting: Licensed Clinical Social Worker

## 2016-08-16 NOTE — Telephone Encounter (Signed)
LCSWA contacted pt via telephone.    LCSWA disclosed that she received a consult from Dr. Janne Napoleon to address depression and anxiety. Pt stated that she has been experiencing depression and would be open to meeting with LCSWA to further discuss.    LCSWA offered to schedule a behavioral health appointment; however, pt requested LCSWA contact her next week to schedule appointment.

## 2016-08-19 ENCOUNTER — Ambulatory Visit (HOSPITAL_COMMUNITY)
Admission: RE | Admit: 2016-08-19 | Payer: Commercial Managed Care - HMO | Source: Ambulatory Visit | Admitting: Internal Medicine

## 2016-08-20 ENCOUNTER — Other Ambulatory Visit: Payer: Self-pay | Admitting: *Deleted

## 2016-08-20 ENCOUNTER — Telehealth: Payer: Self-pay | Admitting: Internal Medicine

## 2016-08-20 NOTE — Patient Outreach (Signed)
[facility]) Care Management  08/20/2016  [name] [name] [birth year] [id number]   Assumed care of member from previous care manager, call placed to follow up on current health status and to inquire about home visit.  Member state that this is not a good time to talk as she is currently on the other phone line.  She state that she will call this care manager back.  Will await call back.   Update @ 1130:  Call received back from member.  She report that she is not doing well as she continues to have chronic pain.  She state that was to see a rheumatologist in [place] this week, but was later informed that a referral would have to be placed to [facility] or [facility] due to complexity of her case.  She has not heard from anyone regarding an appointment date.  She state that she has an appointment on Thursday to have her port flushed, and wish for orders to be placed to have blood drawn at that time while the port is accessed.    She also express concern regarding affordability of medications that are no longer covered under her insurance.  She is open to pharmacy referral for medication assistance.  Member inquires about Christmas assistance for her 70 year old grandson.  After getting information from Education officer, museum, she is advised that the deadline for assistance from the [employer] has passed, but she is provided with contact information for [employer].  Call placed to member's primary PCP office to inquire about rheumatology referral and orders for blood work.  Spoke to [employer], Teaching laboratory technician, informed that referral was placed to [facility].  Call then placed to [facility], spoke to [place].  She state the member is scheduled for the next available appointment, 12/17/16, but is on the cancellation list.  Call then placed back to member, advised to new information.  She is also advised that having blood work done Thursday is too early for an appointment scheduled for April.   She is advised that labs will have to be ordered with the rheumatologist.  She verbalizes understanding.  Member open to home visit to further discuss plan of care.  Visit scheduled within the next 2 weeks, contact information provided.  Advised to contact with questions/concerns in the meantime.   Valente David, [place], MSN [place] [phone]

## 2016-08-20 NOTE — Telephone Encounter (Signed)
[name] from [facility] called to speak with nurse regarding order for patient. Please follow up.  Thank you.

## 2016-08-20 NOTE — Telephone Encounter (Signed)
Contacted monica and spoke with her regarding question

## 2016-08-22 ENCOUNTER — Encounter (HOSPITAL_COMMUNITY): Payer: Self-pay

## 2016-08-22 ENCOUNTER — Ambulatory Visit (HOSPITAL_COMMUNITY)
Admission: RE | Admit: 2016-08-22 | Discharge: 2016-08-22 | Disposition: A | Discharge status: Home / Self Care | Payer: Commercial Managed Care - HMO | Source: Ambulatory Visit | Attending: Internal Medicine | Admitting: Internal Medicine

## 2016-08-22 DIAGNOSIS — Z4689 Encounter for fitting and adjustment of other specified devices: Secondary | ICD-10-CM | POA: Insufficient documentation

## 2016-08-22 MED ORDER — HEPARIN SOD (PORK) LOCK FLUSH 100 UNIT/ML IV SOLN: 500.0000 [IU] | INTRAVENOUS | Status: DC | PRN

## 2016-08-22 MED ORDER — SODIUM CHLORIDE 0.9% FLUSH
10.0000 mL | INTRAVENOUS | Status: DC | PRN
  Administered 2016-08-22: 10 mL

## 2016-08-22 MED ORDER — HEPARIN SOD (PORK) LOCK FLUSH 100 UNIT/ML IV SOLN
500.0000 [IU] | INTRAVENOUS | Status: DC | PRN
  Administered 2016-08-22: 500 [IU]
  Filled 2016-08-22: qty 5

## 2016-08-22 NOTE — Progress Notes (Signed)
Pt here for portacath flush. Flushed with 27ml NS and 22ml heparin without difficulty.  Excellent blood return noted.  Gave pt appointment sheet, next flush is 09/24/16, pt made aware of this.  Pt d/c ambulatory with husband to lobby.

## 2016-08-23 ENCOUNTER — Ambulatory Visit: Payer: Commercial Managed Care - HMO | Admitting: Physical Medicine & Rehabilitation

## 2016-09-05 ENCOUNTER — Telehealth: Payer: Self-pay | Admitting: Internal Medicine

## 2016-09-05 ENCOUNTER — Other Ambulatory Visit: Payer: Self-pay | Admitting: *Deleted

## 2016-09-05 NOTE — Patient Outreach (Signed)
[facility]) Care Management   09/05/2016  [name] [name] December 02, 1960 [id number]  [name] [name] is an 55 y.o. female  Subjective:   Member report that she is doing well.  She denies any chest pain or discomfort at this time, but does report that she continues to have chronic pain intermittently.  She remains aware that she has an appointment with the rheumatologist in April.  She is concerned about transportation to the appointment, but will contact [place] regarding steps to secure transportation.  She report taking all medications as prescribed, but state that she ran out of Lasix yesterday.  She state that she was prescribed it upon discharge from [facility] in July but is not sure if she is to continue.    Objective:   Review of Systems  Constitutional: Negative.   HENT: Negative.   Eyes: Negative.   Respiratory: Negative.   Cardiovascular: Negative.   Gastrointestinal: Negative.   Genitourinary: Negative.   Musculoskeletal: Negative.   Skin: Negative.   Neurological: Negative.   Endo/Heme/Allergies: Negative.   Psychiatric/Behavioral: Negative.     Physical Exam  Constitutional: She is oriented to person, place, and time. She appears well-developed and well-nourished.  Neck: Normal range of motion.  Cardiovascular: Normal rate, regular rhythm and normal heart sounds.   Respiratory: Effort normal and breath sounds normal.  GI: Soft. Bowel sounds are normal.  Musculoskeletal: Normal range of motion.  Neurological: She is alert and oriented to person, place, and time.  Skin: Skin is warm and dry.   BP 102/78 (BP Location: Left Arm, Patient Position: Sitting, Cuff Size: Large) Comment: Unable to obtain  Pulse 65   Resp 20   Ht 1.651 m ('5\' 5"' )   Wt (!) 310 lb (140.6 kg)   SpO2 98%   BMI 51.59 kg/m   Encounter Medications:   Outpatient Encounter Prescriptions as of 09/05/2016  Medication Sig Note  . acetaminophen-codeine (TYLENOL #3) 300-30 MG tablet Take 1  tablet by mouth every 8 (eight) hours as needed for moderate pain. Max total Tyelnol < 2060m /day   . albuterol (PROVENTIL HFA;VENTOLIN HFA) 108 (90 Base) MCG/ACT inhaler Inhale 1-2 puffs into the lungs every 6 (six) hours as needed for wheezing or shortness of breath.   .Marland KitchenamLODipine (NORVASC) 5 MG tablet Take 1 tablet (5 mg total) by mouth daily.   .Marland Kitchenaspirin 81 MG chewable tablet Chew 1 tablet (81 mg total) by mouth daily.   .Marland Kitchenatorvastatin (LIPITOR) 20 MG tablet Take 1 tablet (20 mg total) by mouth daily at 6 PM.   . diclofenac sodium (VOLTAREN) 1 % GEL Apply 2 g topically 4 (four) times daily.   . diphenhydramine-acetaminophen (TYLENOL PM) 25-500 MG TABS tablet Take 1 tablet by mouth at bedtime as needed (sleep).   . docusate sodium (COLACE) 100 MG capsule Take 100 mg by mouth 2 (two) times daily as needed for mild constipation.    . DULoxetine (CYMBALTA) 60 MG capsule Take 1 capsule (60 mg total) by mouth daily. 07/31/2016: Patient has not yet filled this prescription. Wants to talk with PCP before changing antidepressant medication.  . furosemide (LASIX) 40 MG tablet Take 1 tablet (40 mg total) by mouth daily.   .Marland Kitchenloperamide (IMODIUM A-D) 2 MG tablet Take 1 tablet (2 mg total) by mouth 4 (four) times daily as needed for diarrhea or loose stools.   . metoprolol tartrate (LOPRESSOR) 25 MG tablet Take 1 tablet (25 mg total) by mouth 2 (two) times daily.   .Marland Kitchen  potassium chloride (K-DUR) 10 MEQ tablet Take 1 tablet (10 mEq total) by mouth 2 (two) times daily. 07/31/2016: Patient taking differently. Taking once a day.  . rivaroxaban (XARELTO) 20 MG TABS tablet Take 1 tablet (20 mg total) by mouth daily with supper.   . triamcinolone cream (KENALOG) 0.1 % Apply 1 application topically 2 (two) times daily. For rash on left leg   . zaleplon (SONATA) 5 MG capsule Take 1 capsule (5 mg total) by mouth at bedtime as needed for sleep.   Marland Kitchen gabapentin (NEURONTIN) 300 MG capsule Take 1 capsule (300 mg total) by  mouth 3 (three) times daily. 07/31/2016: Patient taking differently. Takes 1 capsule in the morning and others as needed.  . hydrOXYzine (ATARAX/VISTARIL) 25 MG tablet Take 1 tablet (25 mg total) by mouth 3 (three) times daily as needed. (Patient not taking: Reported on 09/05/2016)    No facility-administered encounter medications on file as of 09/05/2016.     Functional Status:   In your present state of health, do you have any difficulty performing the following activities: 08/22/2016 06/27/2016  Hearing? N N  Vision? N N  Difficulty concentrating or making decisions? N N  Walking or climbing stairs? N Y  Dressing or bathing? N Y  Doing errands, shopping? - Y  Preparing Food and eating ? - -  Using the Toilet? - -  In the past six months, have you accidently leaked urine? - -  Do you have problems with loss of bowel control? - -  Managing your Medications? - -  Managing your Finances? - -  Housekeeping or managing your Housekeeping? - -  Some recent data might be hidden    Fall/Depression Screening:    PHQ 2/9 Scores 08/14/2016 07/05/2016 07/05/2016 05/24/2016 05/17/2016 05/16/2016 04/25/2016  PHQ - 2 Score '2 3 3 4 4 1 5  ' PHQ- 9 Score '5 9 9 15 14 8 15    ' Assessment:    Met with member at scheduled time.  She expresses gratitude for the assistance that was provided by previous care manager with getting into the pain clinic and referral for the rheumatologist.  At this time, she no longer has any nursing needs.  Her last appointment with her primary MD was 12/6, not expected to return until March. Per MD note, she has been advised to continue to lose weight and wear CPAP at night.  She state that she has not used it because she is in need of new supplies (filter and tubing).  Per note, she also has history of depression, psych referral pending.  She denies being contacted regarding psych appointment.    Call placed to primary MD office, spoke to [facility], message left in reference to member  needing refill for Lasix and CPAP supplies.  Will await call back and will address pending psych referral at that time.   Member denies any other concerns, provided with contact information for this care manager, advised to contact with questions.  Plan:   Will follow up within the next 2 weeks.  If no further needs will close case at that time.    [facility] CM Care Plan Problem One   Flowsheet Row Most Recent Value  Care Plan Problem One  Recent hospitalization for right sided weakness, HTN, chest pain  Role Documenting the Problem One  Care Management [place] for Problem One  Not Active  THN Long Term Goal (31-90 days)  Patient will not have a hospitalization within the  next 31 days  [facility]N Long Term Goal Start Date  07/05/16  [facility] Long Term Goal Met Date  09/05/16  Interventions for Problem One Long Term Goal  RNCM educated about importance of making all follow  up appointments and when to call her doctor  [facility] CM Short Term Goal #1 (0-30 days)  Patient will attend all follow up appointments within the next 30 days  [facility] CM Short Term Goal #1 Start Date  07/05/16  [facility] CM Short Term Goal #1 Met Date  09/05/16  Interventions for Short Term Goal #1  RNCM provided education about importance of attending all appointments   [facility] CM Short Term Goal #2 (0-30 days)  Patient will take all medications as prescribed within the next 30 days  [facility] CM Short Term Goal #2 Start Date  07/05/16  [facility] CM Short Term Goal #2 Met Date  09/05/16  Interventions for Short Term Goal #2  RNCM provided education about importance of taking all medications as prescribed     Valente David, RN, MSN [place] Manager [phone]

## 2016-09-05 NOTE — Telephone Encounter (Signed)
[name] from [facility] called to speak with PCP regarding patient's medication and supplies. Pt is out of lasixs. Needs refill. Pt is supposed to use a Cpap, [place] Cp[place]  Please follow up.   Thank you.

## 2016-09-06 NOTE — Telephone Encounter (Signed)
Forward to pcp

## 2016-09-10 ENCOUNTER — Other Ambulatory Visit: Payer: Self-pay | Admitting: *Deleted

## 2016-09-10 MED ORDER — FUROSEMIDE 40 MG PO TABS: 40.0000 mg | ORAL_TABLET | Freq: Every day | ORAL | 2 refills | Status: DC

## 2016-09-10 NOTE — Telephone Encounter (Signed)
Contacted pt and made aware of the lasix renewed pt states she doesn't have the results but she will call to see if she can get them and let us know pt states she will follow up with her pulmonologist if need be

## 2016-09-10 NOTE — Patient Outreach (Signed)
[facility]) Care Management  09/10/2016  Surah Fabbro Jan 20, 1961 [id number]   Call received from [place] at primary MD office regarding call placed last week.  Lasix has been refilled, however there is a question regarding member's results of sleep study in order to complete request for CPAP supplies.  Susette Racer will contact member regarding sleep study results and potential psych referral.  Will follow up with member within the next 2 weeks.  Valente David, [place], MSN [place] [phone]

## 2016-09-10 NOTE — Telephone Encounter (Signed)
Does she have her cpap testing results from out of state or the settings of her machine prior? We may be able to get her new machine that way. If not, may have to do cpap testing again.  Please ask her to f/u up w/ her Pulmonologist on this since he may know better way of getting machine faster.  I renewed the lasix. thanks

## 2016-09-16 ENCOUNTER — Telehealth: Payer: Self-pay | Admitting: Internal Medicine

## 2016-09-16 ENCOUNTER — Telehealth: Payer: Self-pay | Admitting: Licensed Clinical Social Worker

## 2016-09-16 ENCOUNTER — Other Ambulatory Visit: Payer: Self-pay | Admitting: Pharmacist

## 2016-09-16 DIAGNOSIS — R079 Chest pain, unspecified: Secondary | ICD-10-CM

## 2016-09-16 NOTE — Telephone Encounter (Signed)
Returned pt call. Pt states she is having chest pain pt states pain has gotten worse in the past 3 months in the past couple weeks the pain is constant. Pt states she is taking the medicine but it is not helping. I informed pt that we would want her to go to the E.D. Pt states she doesn't want to go to the E.D because she already know what they are going to say. Pt states she would like pcp to put in a referral to the cardiologist. I informed pt that the referral takes 1-2 weeks. Pt states she will wait for the referral. Pt states if her chest pain gets worse than she will call 911  Pt would also like a referral to a therapist pt states she feels like she is going crazy.

## 2016-09-16 NOTE — Telephone Encounter (Signed)
Patient called the office to speak with PCP regarding a referral that she needs to see cardiologist. Pt continues to experience chest pain. Pt also has other matters to speak with PCP about. Please follow up.  Thank you.

## 2016-09-16 NOTE — Patient Outreach (Signed)
[name] [name] is a 56 y.o. female referred to pharmacy for medication assistance. Called and spoke with patient. HIPAA identifiers verified and verbal consent received.  [name] reports that she is having difficulty with affording her Xarelto. Reports that she has a $45 copay for this medication. Reports that several weeks ago she went without the medication for a week because she was unable to afford the copayment. However, reports that she has been taking it now, but that she only has about 2 weeks worth of medication remaining. Counsel patient about the importance of medication adherence and the importance of communicating with her doctor if she is unable to obtain her medication.  [name] reports that her husband receives extra help with the cost of his medications, but that she currently does not and has not applied for herself. Offer to complete the extra help application with the patient today. Completed extra help application with the patient and her husband. Receive permission from both the patient and her husband to submit the application based on the answers that they each provided.   [name] reports that she was supposed to arrange a follow up Cardiology appointment, but that she needs to obtain a referral to do this from her PCP. Patient also reports that she has been having ongoing chest pain that has been concerning her. Advise patient to call her PCP's office now to discuss this pain that she has been having. Patient reports that she will call now as soon as we hang up and that she will also ask about the Cardiology referral when she calls.  [name] reports that she has no further medication questions/concerns at this time. Provide patient with my phone number.  PLAN:  1) [name] to call to follow up with PCP's office today regarding ongoing chest pain and Cardiology referral. Patient to also ask provider about samples of Xarelto.  2) Will call to follow up with Ms.  [name] [name] next week.  Harlow Asa, PharmD, [place] [facility] [phone]

## 2016-09-16 NOTE — Telephone Encounter (Signed)
[facility]CSWA received an incoming call from pt. Pt stated that she feels overwhelmed (from psychosocial stressors) and has been experiencing chronic chest pain with no relief. Pt was unwilling to go to ED to be medically evaluated.   Olivia Lopez de Gutierrez educated pt on how mental and physical health correlates to one another, in addition, to the importance of utilizing coping skills to decrease symptoms of depression and/or anxiety. Pt was provided resources for crisis interventions (i.e. Contacting 911 and [facility]) and agreed to contact resources, if needed. She denied SI/HI.

## 2016-09-17 NOTE — Telephone Encounter (Signed)
I put in referral for cardiology - may take 1-2 wks.  Referral placed to psyche by Dr Posey Pronto in November already. I emailed Alinda Sierras, our referral specialist to check on the status of that as well.

## 2016-09-18 ENCOUNTER — Other Ambulatory Visit: Payer: Self-pay | Admitting: Internal Medicine

## 2016-09-18 DIAGNOSIS — F411 Generalized anxiety disorder: Secondary | ICD-10-CM

## 2016-09-23 NOTE — Progress Notes (Addendum)
Cardiology Office Note    Date:  09/24/2016   ID:  [name] [name], DOB 1960/11/13, MRN [id number]  PCP:  [name] Reamer, MD  Cardiologist:  New- seen by Dr. Lovena [name] in office with me today, but will have her establish care with Dr. Haroldine [name]   CC: chest pain.   History of Present Illness:  [name] [name] is a 56 y.o. 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   Patient was in her [place] until 08/2015 when she started getting symptoms of dyspnea on exertion, dizziness, bilateral [name] swelling, and constant R>L non-specific chest pain. These symptoms progressively worsened over several days prompting her to go to [facility] in her then-hometown of [place] in 09/2015 where she was diagnosed with PE and started on anticoagulation. She was very briefly on warfarin but quickly switched to Xarelto which she has continued ever since. Despite compliance with her meds, her symptoms unfortunately never improved. She moved from [place] to [place] in February, and in March she went to [facility] ED for worsening DOE, chest pain, and dizziness. She reports she was again found to have PE and diagnosed with vertigo. She was referred to [facility] in 12/2015. Patient refused recommended LHC because of financial fears and unclear insurance status. Echocardiogram (12-2015) showed normal LVEF 55-60%, normal LA, normal RV size, RVSP estimated as 70 mm Hg. She further was referred to [facility] and saw Dr. Karena Addison on 03/14/2016 where patient had several studies including V/Q scan which found high prob for PE; studies c/w CTEPH. It was felt that her chronic chest pain was caused by chronic PE. RHC/LHC/Pulm Angiogram 03/2016 confirmed diagnosis of CTEPH. She underwent IVC  filter placement on 03/25/16 as well as pulmonary thromboembolectomy 03/31/2016 at [facility].   Seen by Dr. Karena Addison in 04/2016 for follow up. He felt like her heart size was decreasing. Plan was for lifelong [place] and ECHO and V/Q in 4 months. Dr. Karena Addison has since left the [facility] practice.    CT angio at [facility] on 05/15/16 showed " persistent but partially recanalized PE within right lower lobe pulmonary artery. No new PE identified. "  She was admitted to [facility] in 06/2016 for continued chest pain and parasthesias. V/Q scan on 06/27/16 showed prominent ventilation perfusion mismatch in right c/w PE. Head CT negative. She was continued on Xarelto and told to follow up with [facility]. Patient doesn't want to have to travel to [facility] for care so [facility] care here in [place].   Today she presents for evaluation of worsening of her chronic chest pain. She breaks down in tears in the room. Doesn't know if she can survive much longer like this. Wonders if this is how she will die. Her breathing did improve after the PTE, but now seems to be getting worse. Worse with exertion and supine position. She just knows something is "not right with her heart." She has sharp chest pain currently in the office. This occurs sporadically and radiates around her belly and to her back. It gets worse when laying flat, not necessarily worse with exertion. No [name] edema or PND. Does report some orthopnea. No palpitations. No blood in stool or urine. Says nothing has helped her chest pain. Tylenol #3 was given to her by her PCP  does take the edge off. She is supposed to see a pain clinic but they will not accept her until a possible rheumatologic condition is ruled out. She sees a rheumatologist in 12/2016 ( first available appointment).    Past Medical History:  Diagnosis Date  . Arthritis   . Depression   . Diverticulitis   . Hypertension   . Obesity   . Pneumonia   . Pulmonary embolism [facility]     Past Surgical History:  Procedure  Laterality Date  . ABDOMINAL HYSTERECTOMY    . BREAST REDUCTION SURGERY    . CHOLECYSTECTOMY    . EMBOLECTOMY N/A    pulmonary embolectomy    Current Medications: Outpatient Medications Prior to Visit  Medication Sig Dispense Refill  . albuterol (PROVENTIL HFA;VENTOLIN HFA) 108 (90 Base) MCG/ACT inhaler Inhale 1-2 puffs into the lungs every 6 (six) hours as needed for wheezing or shortness of breath. 6.7 g 5  . amLODipine (NORVASC) 5 MG tablet Take 1 tablet (5 mg total) by mouth daily. 90 tablet 3  . aspirin 81 MG chewable tablet Chew 1 tablet (81 mg total) by mouth daily. 90 tablet 3  . atorvastatin (LIPITOR) 20 MG tablet Take 1 tablet (20 mg total) by mouth daily at 6 PM. 90 tablet 2  . diclofenac sodium (VOLTAREN) 1 % GEL Apply 2 g topically 4 (four) times daily. 100 g 2  . diphenhydramine-acetaminophen (TYLENOL PM) 25-500 MG TABS tablet Take 1 tablet by mouth at bedtime as needed (sleep).    . docusate sodium (COLACE) 100 MG capsule Take 100 mg by mouth 2 (two) times daily.     . DULoxetine (CYMBALTA) 60 MG capsule Take 1 capsule (60 mg total) by mouth daily. 30 capsule 1  . furosemide (LASIX) 40 MG tablet Take 1 tablet (40 mg total) by mouth daily. 30 tablet 2  . loperamide (IMODIUM A-D) 2 MG tablet Take 1 tablet (2 mg total) by mouth 4 (four) times daily as needed for diarrhea or loose stools. 30 tablet 0  . metoprolol tartrate (LOPRESSOR) 25 MG tablet Take 1 tablet (25 mg total) by mouth 2 (two) times daily. 60 tablet 3  . potassium chloride (K-DUR) 10 MEQ tablet Take 1 tablet (10 mEq total) by mouth 2 (two) times daily. 30 tablet 2  . rivaroxaban (XARELTO) 20 MG TABS tablet Take 1 tablet (20 mg total) by mouth daily with supper. 90 tablet 3  . triamcinolone cream (KENALOG) 0.1 % Apply 1 application topically 2 (two) times daily. For rash on left leg 30 g 0  . zaleplon (SONATA) 5 MG capsule Take 1 capsule (5 mg total) by mouth at bedtime as needed for sleep. 90 capsule 2  .  acetaminophen-codeine (TYLENOL #3) 300-30 MG tablet Take 1 tablet by mouth every 8 (eight) hours as needed for moderate pain. Max total Tyelnol < 2000mg  /day 50 tablet 0  . gabapentin (NEURONTIN) 300 MG capsule Take 1 capsule (300 mg total) by mouth 3 (three) times daily. 90 capsule 1  . hydrOXYzine (ATARAX/VISTARIL) 25 MG tablet Take 1 tablet (25 mg total) by mouth 3 (three) times daily as needed. (Patient not taking: Reported on 09/24/2016) 90 tablet 0   Facility-Administered Medications Prior to Visit  Medication Dose Route Frequency Provider Last Rate Last Dose  . heparin lock flush 100 unit/mL  500 Units Intracatheter Prior to discharge [name] Reamer, MD   500 Units at 09/24/16 0910  . sodium chloride flush (NS) 0.9 % injection 10  mL  10 mL Intracatheter Prior to discharge [name] Reamer, MD   10 mL at 09/24/16 0910     Allergies:   [name].   Social History   Social History  . Marital status: Married    Spouse name: N/A  . Number of children: N/A  . Years of education: N/A   Social History Main Topics  . Smoking status: Never Smoker  . Smokeless tobacco: Never Used  . Alcohol use No  . Drug use: No  . Sexual activity: Not on file   Other Topics Concern  . Not on file   Social History Narrative  . No narrative on file     Family History:  The patient's family history includes Lung cancer in her maternal grandmother.      ROS:   Please see the history of present illness.    ROS All other systems reviewed and are negative.   PHYSICAL EXAM:   VS:  BP 130/76   Pulse 77   Ht 5\' 5"  (1.651 m)   Wt (!) 314 lb 12.8 oz (142.8 kg)   BMI 52.39 kg/m    GEN: Well nourished, well developed, in no acute distress, morbidly obese.  HEENT: normal  Neck: no JVD, carotid bruits, or masses Cardiac: RRR; no murmurs, rubs, or gallops,no edema  Respiratory:  clear to auscultation bilaterally, normal work of breathing GI: soft, nontender, nondistended, +  BS MS: no deformity or atrophy  Skin: warm and dry, no rash Neuro:  Alert and Oriented x 3, Strength and sensation are intact Psych: euthymic mood, full affect   Wt Readings from Last 3 Encounters:  09/24/16 (!) 314 lb 12.8 oz (142.8 kg)  09/24/16 (!) 314 lb 9.6 oz (142.7 kg)  09/05/16 (!) 310 lb (140.6 kg)      Studies/Labs Reviewed:   EKG:  EKG is ordered today.  The ekg ordered today demonstrates NSR, RBBB, HR 77   Recent Labs: 03/20/2016: B Natriuretic Peptide 88.3 06/26/2016: BUN 12; Creatinine, Ser 0.88; Hemoglobin 11.5; Platelets 335; Potassium 3.9; Sodium 140 06/27/2016: ALT 9   Lipid Panel    Component Value Date/Time   CHOL 166 06/27/2016 0346   TRIG 125 06/27/2016 0346   HDL 33 (L) 06/27/2016 0346   CHOLHDL 5.0 06/27/2016 0346   VLDL 25 06/27/2016 0346   LDLCALC 108 (H) 06/27/2016 0346    Additional studies/ records that were reviewed today include:   2D ECHO: 12/15/2015 LV EF: 55% -   60% Study Conclusions - Left ventricle: The cavity size was normal. Wall thickness was normal. Systolic function was normal. The estimated ejection  fraction was in the range of 55% to 60%. - Pulmonary arteries: PA peak pressure: 70 mm Hg (S). - Pericardium, extracardiac: A trivial pericardial effusion was   identified.   2D ECHO 04/05/16 INTERPRETATION:NORMAL LEFT VENTRICULAR FUNCTION WITH MILD LVHSEVERE RV SYSTOLIC DYSFUNCTION (See above) VALVULAR REGURGITATION: TRIVIAL PR, MILD TR NO VALVULAR STENOSIS TRIVIAL PERICARDIAL EFFUSION Compared with prior Echo study on 03/14/2016: RVSP increased from 50 to 69   CATH right heart and coronary angiography: 03/25/2016 [place] Component Name Value Ref Range  Cardiac Index (l/min/m2) 2.3 L/min/m2  Right Atrium Mean Pressure (mmHg) 11 mmHg   Right Ventricle Systolic Pressure (mmHg) 68 mmHg   Pulmonary Artery Mean Pressure (mmHg) 40 mmHg   Pulmonary Wedge Pressure (mmHg) 14 mmHg   Pulmonary Vascular  Resistance ([place] units) 4.7    CT angio 05/15/16  IMPRESSION: Persistent but partially recanalized pulmonary embolus within the right lower lobe pulmonary artery. No new pulmonary embolism is identified. Minimal bibasilar atelectatic changes stable from the prior exam.   V/Q scan 06/27/16 IMPRESSION: Prominent ventilation perfusion mismatch noted on the right. Finding suggests high probability right-sided pulmonary embolus in this patient with known right-sided pulmonary embolus.   ASSESSMENT & PLAN:   Chronic chest pain: per [facility] notes in [place], felt to be from her chronic PEs and pulmonary HTN. LHC performed at [place] at the time of pulmonary angiogram and RHC but I cannot see records (assume no CAD). Takes tyelnol # 3 for chest pain from PCP. Pain clinic will not accept her until rheumatologic condition is ruled out. Couldn't get an appointment with rheumatology until 12/2015. She was given a RX for Tylenol#3 #50 in early December by PCP. She has 10 pills left but doesn't have anything else to get her through until rheum appointment in April. Dr. Lovena [name] has agreed to give another Rx for this to get her through until April. She understands she will not get any refills. We discussed how narcotics will likely not help this pain long term and the potential for abuse. Discussed case with Dr. Haroldine [name] as well who is willing to take her on as a patient. Plan will be to try lidocaine patch or lyrica when he see's her back in the office (she already takes gabapentin 300mg  TID but dose could likely be increased) .   Chronic PE s/p IVC filter on lifelong [place] with Xarelto: was followed at [facility]. She ran out of Xarelto and has been provided samples today and will pick RX from pharmacy. 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 Karena Addison at [facility]. Dr. Karena Addison will be leaving [facility] and she would like to follow in [place]. Will update 2D ECHO to  assess pulmonary pressures and have her set up with Dr. Haroldine [name] next available  HTN: BP well controlled today.   RA: recently diagnosed. Sees rheumatology 12/2016 to see if any autoimmune process is contributing to her chronic pain.   Depression/anxiety: this is certainly contributing to her chronic pain. She was very tearful in the office today. She will continue on Cymbalta. She denies suicidal ideation but often thinks "this pain may kill her." Her PCP has referred her to psychiatry.   Total time spent with patient was over 40 minutes which included evaluating patient, reviewing record and coordinating care. Face to face time >50%. I spent almost an hour looking through old records and care everywhere. Discussed case with Dr. Lovena [name] (DOD) who saw patient in office with me and agrees with plan. Also discussed case with Dr. Haroldine [name] who is willing to take her on as a patient.     Medication Adjustments/Labs and Tests Ordered: Current medicines are reviewed at length with the patient today.  Concerns regarding medicines are outlined above.  Medication changes, Labs and Tests ordered today are listed in the Patient Instructions below. Patient Instructions  Medication Instructions:  Your physician recommends that you continue on your current medications as directed. Please refer to the Current Medication list given to you today.   Labwork: None ordered  Testing/Procedures: Your physician has requested that you have an echocardiogram ASAP. Echocardiography is a painless test that uses sound waves to create images of your heart. It provides your doctor with information about the size and shape of your heart and how well your heart's chambers and  valves are working. This procedure takes approximately one hour. There are no restrictions for this procedure.    Follow-Up: Your physician recommends that you schedule a follow-up appointment in: [name] (AFTER THE  ECHOCARDIOGRAM)   Any Other Special Instructions Will Be Listed Below (If Applicable).  Echocardiogram An echocardiogram, or echocardiography, uses sound waves (ultrasound) to produce an image of your heart. The echocardiogram is simple, painless, obtained within a short period of time, and offers valuable information to your health care provider. The images from an echocardiogram can provide information such as:  Evidence of coronary artery disease (CAD).  Heart size.  Heart muscle function.  Heart valve function.  Aneurysm detection.  Evidence of a past heart attack.  Fluid buildup around the heart.  Heart muscle thickening.  Assess heart valve function. Tell a health care provider about:  Any allergies you have.  All medicines you are taking, including vitamins, herbs, eye drops, creams, and over-the-counter medicines.  Any problems you or family members have had with anesthetic medicines.  Any blood disorders you have.  Any surgeries you have had.  Any medical conditions you have.  Whether you are pregnant or may be pregnant. What happens before the procedure? No special preparation is needed. Eat and drink normally. What happens during the procedure?  In order to produce an image of your heart, gel will be applied to your chest and a wand-like tool (transducer) will be moved over your chest. The gel will help transmit the sound waves from the transducer. The sound waves will harmlessly bounce off your heart to allow the heart images to be captured in real-time motion. These images will then be recorded.  You may need an IV to receive a medicine that improves the quality of the pictures. What happens after the procedure? You may return to your normal schedule including diet, activities, and medicines, unless your health care provider tells you otherwise. This information is not intended to replace advice given to you by your health care provider. Make sure you  discuss any questions you have with your health care provider. Document Released: 08/23/2000 Document Revised: 04/13/2016 Document Reviewed: 05/03/2013 Elsevier Interactive Patient Education  2017 [employer].   If you need a refill on your cardiac medications before your next appointment, please call your pharmacy.      Signed, Angelena Form, PA-C  09/24/2016 12:50 PM    [facility] [address], [place], [place]  [address] Phone: [phone]; Fax: [phone]

## 2016-09-24 ENCOUNTER — Encounter (HOSPITAL_COMMUNITY): Payer: Self-pay

## 2016-09-24 ENCOUNTER — Ambulatory Visit (HOSPITAL_COMMUNITY)
Admission: RE | Admit: 2016-09-24 | Discharge: 2016-09-24 | Disposition: A | Discharge status: Home / Self Care | Payer: Medicare HMO | Source: Ambulatory Visit | Attending: Internal Medicine | Admitting: Internal Medicine

## 2016-09-24 ENCOUNTER — Ambulatory Visit (INDEPENDENT_AMBULATORY_CARE_PROVIDER_SITE_OTHER): Payer: Medicare HMO | Admitting: Physician Assistant

## 2016-09-24 ENCOUNTER — Encounter: Payer: Self-pay | Admitting: Internal Medicine

## 2016-09-24 VITALS — BP 130/76 | HR 77 | Ht 65.0 in | Wt 314.8 lb

## 2016-09-24 DIAGNOSIS — I00 Rheumatic fever without heart involvement: Secondary | ICD-10-CM

## 2016-09-24 DIAGNOSIS — I272 Pulmonary hypertension, unspecified: Secondary | ICD-10-CM | POA: Diagnosis not present

## 2016-09-24 DIAGNOSIS — R0789 Other chest pain: Secondary | ICD-10-CM

## 2016-09-24 DIAGNOSIS — I10 Essential (primary) hypertension: Secondary | ICD-10-CM | POA: Diagnosis not present

## 2016-09-24 DIAGNOSIS — I2782 Chronic pulmonary embolism: Secondary | ICD-10-CM | POA: Diagnosis not present

## 2016-09-24 DIAGNOSIS — M0520 Rheumatoid vasculitis with rheumatoid arthritis of unspecified site: Secondary | ICD-10-CM

## 2016-09-24 MED ORDER — SODIUM CHLORIDE 0.9% FLUSH
10.0000 mL | INTRAVENOUS | Status: DC | PRN
  Administered 2016-09-24: 10 mL

## 2016-09-24 MED ORDER — HEPARIN SOD (PORK) LOCK FLUSH 100 UNIT/ML IV SOLN
500.0000 [IU] | INTRAVENOUS | Status: DC | PRN
  Administered 2016-09-24: 500 [IU]
  Filled 2016-09-24: qty 5

## 2016-09-24 MED ORDER — ACETAMINOPHEN-CODEINE #3 300-30 MG PO TABS: 1.0000 | ORAL_TABLET | Freq: Three times a day (TID) | ORAL | 0 refills | Status: DC | PRN

## 2016-09-24 NOTE — Patient Instructions (Signed)
Medication Instructions:  Your physician recommends that you continue on your current medications as directed. Please refer to the Current Medication list given to you today.   Labwork: None ordered  Testing/Procedures: Your physician has requested that you have an echocardiogram ASAP. Echocardiography is a painless test that uses sound waves to create images of your heart. It provides your doctor with information about the size and shape of your heart and how well your heart's chambers and valves are working. This procedure takes approximately one hour. There are no restrictions for this procedure.    Follow-Up: Your physician recommends that you schedule a follow-up appointment in: [place] (AFTER THE ECHOCARDIOGRAM)   Any Other Special Instructions Will Be Listed Below (If Applicable).  Echocardiogram An echocardiogram, or echocardiography, uses sound waves (ultrasound) to produce an image of your heart. The echocardiogram is simple, painless, obtained within a short period of time, and offers valuable information to your health care provider. The images from an echocardiogram can provide information such as:  Evidence of coronary artery disease (CAD).  Heart size.  Heart muscle function.  Heart valve function.  Aneurysm detection.  Evidence of a past heart attack.  Fluid buildup around the heart.  Heart muscle thickening.  Assess heart valve function. Tell a health care provider about:  Any allergies you have.  All medicines you are taking, including vitamins, herbs, eye drops, creams, and over-the-counter medicines.  Any problems you or family members have had with anesthetic medicines.  Any blood disorders you have.  Any surgeries you have had.  Any medical conditions you have.  Whether you are pregnant or may be pregnant. What happens before the procedure? No special preparation is needed. Eat and drink normally. What happens  during the procedure?  In order to produce an image of your heart, gel will be applied to your chest and a wand-like tool (transducer) will be moved over your chest. The gel will help transmit the sound waves from the transducer. The sound waves will harmlessly bounce off your heart to allow the heart images to be captured in real-time motion. These images will then be recorded.  You may need an IV to receive a medicine that improves the quality of the pictures. What happens after the procedure? You may return to your normal schedule including diet, activities, and medicines, unless your health care provider tells you otherwise. This information is not intended to replace advice given to you by your health care provider. Make sure you discuss any questions you have with your health care provider. Document Released: 08/23/2000 Document Revised: 04/13/2016 Document Reviewed: 05/03/2013 Elsevier Interactive Patient Education  2017 [employer].   If you need a refill on your cardiac medications before your next appointment, please call your pharmacy.

## 2016-09-24 NOTE — Progress Notes (Signed)
Pt had sleep study, 08/07/14. Dx significant osa, recd cpap at 9cm pressure, medium fisher paykel simplus mask.  Will send rx if pt does not already have cpap machine, ask cm to assist if.  I believe as of last time I spoke to her, her mask was not working.  Will scan notes.

## 2016-09-25 ENCOUNTER — Other Ambulatory Visit: Payer: Self-pay | Admitting: Pharmacist

## 2016-09-25 ENCOUNTER — Other Ambulatory Visit: Payer: Self-pay | Admitting: *Deleted

## 2016-09-25 ENCOUNTER — Ambulatory Visit: Payer: Self-pay | Admitting: Pharmacist

## 2016-09-25 NOTE — Patient Outreach (Signed)
Call to follow up with [name] [name] about her Cardiology referral and her Xarelto. HIPAA identifiers verified and verbal consent received.  [name] [name] reports that she was seen by Cardiology yesterday. Reports that she has further follow up planned including having an updated ECHO. Reports that she was given two weeks of samples of Xarelto.  Review the medicare.gov website to see if a determination has been made for the patient's extra help. Per website, patient has partial 75% extra help. Call [employer] to determine patient's Xarelto copayment. Per representative, the patient's level of extra help requires her to pay 15% coinsurance and that her tier 3, $45 copayment, is actually cheaper given the cost of Xarelto. Provide patient with this information and discuss the importance of adherence to her Xarelto. Patient reports that she was on warfarin in the hospital prior to Xarelto, but that she is not interested in being on warfarin given the required monitoring with this medication. Reports that she is going to work on her budgeting and be sure to set aside $45 each month when she receives her check. Reports that the nurse at her [place] office also stated that she should request samples again if she is ever unable to pay her copay. Patient states that she is aware of the importance of not missing a dose.  [name] [name] reports that she has no further medication questions/concerns at this time. Reports that she has my phone number.  Will close pharmacy episode at this time.  Harlow Asa, PharmD, [place] [facility] [phone]

## 2016-09-25 NOTE — Patient Outreach (Addendum)
[facility]) Care Management  09/25/2016  [name] [name] [birth year] [id number]   Call placed to member to follow up on current health status and to inform member of reassignment back to originally assigned care manager, V. Satterfield.  Female answering phone state that the member did not have a good night and was now resting.  Contact information provided, requested for member to call back when she is available.  Will await call back.   Update @ 2pm:  Call received back from member.  She state that she has been "going through it."  She report that she has continued to have chest pain as well as other chronic pain, which has started to cause her more mental symptoms (anxiety).  She confirm that she was in contact with cardiology and has been referred to cardiac surgeon to assess pressures in her heart.  She state that she also has an appointment for an echo on this coming Friday.    She state that she still have not heard anything regarding the psych evaluation, aware that per notes, primary MD office is looking into referral.  Advised to contact the referral coordinator for more information.  She state that she does have an emergency psychiatric help line that she can call if an emergency presents itself before she has had a chance to be seen.    She denies any other concerns at this time.  Advised that V. Satterfield will take over case.  Next contact will be by Mrs. Satterfield.   Valente David, [place], MSN [place] [phone]

## 2016-09-27 ENCOUNTER — Telehealth: Payer: Self-pay

## 2016-09-27 ENCOUNTER — Ambulatory Visit (HOSPITAL_COMMUNITY): Admission: RE | Admit: 2016-09-27 | Payer: Medicare HMO | Source: Ambulatory Visit

## 2016-09-27 NOTE — Telephone Encounter (Signed)
This Case Manager placed call to patient to determine if she has a CPAP machine and if so name of DME supplier as Dr. Janne Napoleon wrote rx for CPAP mask and machine. Spoke with patient who confirmed she has a CPAP machine; however, she does not have a mask or any supplies for her CPAP machine, such as tubing. She indicated she received her machine from [facility] when she lived in [place], [place]; however, she has not received CPAP supplies since she moved to [place], [place].   This Case Manager placed call to [facility] [phone]) and spoke with [place]. She indicated since patient has not received supplies from them since 2016 they will need a new script from physician for supplies needed: CPAP tubing, filters, cushions, headgear, mask.Lurline Idol indicated supplies could be mailed to patient's new address in [place] once script and demographics information received.  Patient's address updated with [facility]. Will route to Dr. Janne Napoleon so she is aware that script needed for above supplies. Script along with demographics sheet will need to be faxed to [phone] once obtained.

## 2016-09-30 ENCOUNTER — Telehealth: Payer: Self-pay | Admitting: Internal Medicine

## 2016-09-30 ENCOUNTER — Other Ambulatory Visit: Payer: Self-pay | Admitting: Physical Medicine & Rehabilitation

## 2016-09-30 ENCOUNTER — Telehealth: Payer: Self-pay

## 2016-09-30 NOTE — Telephone Encounter (Signed)
Prescription for CPAP mask, tubing, filters, cushions and headgear faxed to [facility]. Fax # [phone]

## 2016-09-30 NOTE — Telephone Encounter (Signed)
Error

## 2016-10-01 ENCOUNTER — Ambulatory Visit: Payer: Medicare HMO | Attending: Internal Medicine | Admitting: Internal Medicine

## 2016-10-01 ENCOUNTER — Ambulatory Visit (HOSPITAL_BASED_OUTPATIENT_CLINIC_OR_DEPARTMENT_OTHER): Payer: Medicare HMO | Admitting: Licensed Clinical Social Worker

## 2016-10-01 ENCOUNTER — Encounter: Payer: Self-pay | Admitting: Internal Medicine

## 2016-10-01 ENCOUNTER — Telehealth: Payer: Self-pay

## 2016-10-01 VITALS — BP 133/84 | HR 70 | Temp 98.2°F | Resp 16 | Wt 318.4 lb

## 2016-10-01 DIAGNOSIS — Z7901 Long term (current) use of anticoagulants: Secondary | ICD-10-CM | POA: Insufficient documentation

## 2016-10-01 DIAGNOSIS — I272 Pulmonary hypertension, unspecified: Secondary | ICD-10-CM | POA: Diagnosis not present

## 2016-10-01 DIAGNOSIS — Z7982 Long term (current) use of aspirin: Secondary | ICD-10-CM | POA: Diagnosis not present

## 2016-10-01 DIAGNOSIS — F411 Generalized anxiety disorder: Secondary | ICD-10-CM

## 2016-10-01 DIAGNOSIS — I2724 Chronic thromboembolic pulmonary hypertension: Secondary | ICD-10-CM

## 2016-10-01 DIAGNOSIS — M797 Fibromyalgia: Secondary | ICD-10-CM | POA: Insufficient documentation

## 2016-10-01 DIAGNOSIS — F329 Major depressive disorder, single episode, unspecified: Secondary | ICD-10-CM | POA: Diagnosis not present

## 2016-10-01 DIAGNOSIS — I10 Essential (primary) hypertension: Secondary | ICD-10-CM | POA: Insufficient documentation

## 2016-10-01 DIAGNOSIS — M25562 Pain in left knee: Secondary | ICD-10-CM | POA: Insufficient documentation

## 2016-10-01 DIAGNOSIS — J069 Acute upper respiratory infection, unspecified: Secondary | ICD-10-CM | POA: Diagnosis not present

## 2016-10-01 DIAGNOSIS — G4700 Insomnia, unspecified: Secondary | ICD-10-CM | POA: Insufficient documentation

## 2016-10-01 DIAGNOSIS — F419 Anxiety disorder, unspecified: Secondary | ICD-10-CM | POA: Insufficient documentation

## 2016-10-01 DIAGNOSIS — I2782 Chronic pulmonary embolism: Secondary | ICD-10-CM | POA: Insufficient documentation

## 2016-10-01 MED ORDER — GUAIFENESIN ER 600 MG PO TB12: 600.0000 mg | ORAL_TABLET | Freq: Two times a day (BID) | ORAL | 0 refills | Status: DC

## 2016-10-01 MED ORDER — AMITRIPTYLINE HCL 50 MG PO TABS: 50.0000 mg | ORAL_TABLET | Freq: Every day | ORAL | 2 refills | Status: DC

## 2016-10-01 NOTE — Telephone Encounter (Signed)
Call placed to [facility] # [phone] to confirm receipt of the prescription for CPAP supplies. Spoke to [place] who stated that the prescription was received but mor information might be needed. She noted that as per their records, the patient received the CPAP on 08/23/14 and returned the machine on 04/28/15 yet CPAP  supplies were sent to the patient on 08/31/15. She noted that there was no documentation that the machine was returned to the patient. She stated that the machine was returned because payments were not being made and the patient could not afford supplies. This CM informed her that the patient has reported that she has a machine. This CM to call the patient and confirm that she has a CPAP.   Call placed to the patient and she confirmed that she has a CPAP machine from [facility]. She stated that she did return the machine because her medicaid " ran out" and she was receiving bills for the machine and could not afford it. She noted that her medicaid was soon re-instated and she went back and picked up the machine 1-2 days later. She confirmed that she does not need a CPAP machine, only supplies. Informed her that this CM would call back if there were any additional concerns/questions.  Call placed to [facility]. Spoke to [place] who also confirmed receipt of the prescription for CPAP supplies. She then spoke to the respiratory therapy department and and stated that since the patient already has a CPAP machine, no matter what company it is from, they can provided the supplies with the prescription that was received.  She said that no other information is needed to process the order.

## 2016-10-01 NOTE — Telephone Encounter (Signed)
Pt. Dropped of SCAT application to be filled out by her PCP. Pt. Has done her part and was told that paperwork would be put in PCP box. Pt. Requested for the application to be faxed once completed. Please f/u

## 2016-10-01 NOTE — Progress Notes (Signed)
[name] [name], is a 56 y.o. female  [id number]  [id number]  DOB - Mar 12, 1961  Chief Complaint  Patient presents with  . Knee Pain        Subjective:   [name] [name] is a 56 y.o. female here today for a follow up visit for depression and left knee pain. Hx of pains in knees in past, per pt noted improved w/ sometype of surgical intervention. Of note, last few days, has had increasing pains behind her left knee, and thought she felt a "knot".  Of note, hx of chronic pe, chronic chest pains, morbid obesity, and on Xarelto. She recently saw cardiology, and has echo repeat pending.  Of note, since switching her from celexa to cymbata, pt states her pains are about the same but feels her depression is worse. Denies si/hi/avh, but does have thoughts that she is "going to die" from her disease. Not sleeping well due to insomnia and pains.   Sob w/ exertion, productive cough x 5 days now.  No f/c.  Denies orthopnea  Patient has No headache, No chest pain, No abdominal pain - No Nausea, No new weakness tingling or numbness, No Cough - SOB.  No problems updated.  ALLERGIES: No Known Allergies  PAST MEDICAL HISTORY: Past Medical History:  Diagnosis Date  . Arthritis   . Depression   . Diverticulitis   . Hypertension   . Obesity   . Pneumonia   . Pulmonary embolism ([place])     MEDICATIONS AT HOME: Prior to Admission medications   Medication Sig Start Date End Date Taking? Authorizing Provider  acetaminophen-codeine (TYLENOL #3) 300-30 MG tablet Take 1 tablet by mouth every 8 (eight) hours as needed for moderate pain. Max total Tyelnol < 2000mg  /day 09/24/16   Eileen Stanford, PA-C  albuterol (PROVENTIL HFA;VENTOLIN HFA) 108 (90 Base) MCG/ACT inhaler Inhale 1-2 puffs into the lungs every 6 (six) hours as needed for wheezing or shortness of breath. 02/08/16   Maryan Puls, MD  amitriptyline (ELAVIL) 50 MG tablet Take 1 tablet (50 mg total) by mouth at bedtime. Try 1/2 tab  first, if enough keep 1/2 dose. If not, than increase to full dose. 10/01/16   Maren Reamer, MD  amLODipine (NORVASC) 5 MG tablet Take 1 tablet (5 mg total) by mouth daily. 08/14/16   Maren Reamer, MD  aspirin 81 MG chewable tablet Chew 1 tablet (81 mg total) by mouth daily. 08/14/16   Maren Reamer, MD  atorvastatin (LIPITOR) 20 MG tablet Take 1 tablet (20 mg total) by mouth daily at 6 PM. 08/14/16   Maren Reamer, MD  diclofenac sodium (VOLTAREN) 1 % GEL Apply 2 g topically 4 (four) times daily. 08/14/16   Maren Reamer, MD  diphenhydramine-acetaminophen (TYLENOL PM) 25-500 MG TABS tablet Take 1 tablet by mouth at bedtime as needed (sleep).    Historical Provider, MD  docusate sodium (COLACE) 100 MG capsule Take 100 mg by mouth 2 (two) times daily.     Historical Provider, MD  DULoxetine (CYMBALTA) 60 MG capsule TAKE ONE CAPSULE BY MOUTH ONCE DAILY 09/30/16   Ankit Lorie Phenix, MD  furosemide (LASIX) 40 MG tablet Take 1 tablet (40 mg total) by mouth daily. 09/10/16   Maren Reamer, MD  gabapentin (NEURONTIN) 300 MG capsule Take 1 capsule (300 mg total) by mouth 3 (three) times daily. 07/18/16 08/17/16  Ankit Lorie Phenix, MD  guaiFENesin (MUCINEX) 600 MG 12 hr tablet Take 1 tablet (600 mg  total) by mouth 2 (two) times daily. 10/01/16   Maren Reamer, MD  loperamide (IMODIUM A-D) 2 MG tablet Take 1 tablet (2 mg total) by mouth 4 (four) times daily as needed for diarrhea or loose stools. 07/16/16   Maren Reamer, MD  metoprolol tartrate (LOPRESSOR) 25 MG tablet Take 1 tablet (25 mg total) by mouth 2 (two) times daily. 08/14/16   Maren Reamer, MD  potassium chloride (K-DUR) 10 MEQ tablet Take 1 tablet (10 mEq total) by mouth 2 (two) times daily. 07/30/16   Maren Reamer, MD  rivaroxaban (XARELTO) 20 MG TABS tablet Take 1 tablet (20 mg total) by mouth daily with supper. 08/14/16   Maren Reamer, MD  triamcinolone cream (KENALOG) 0.1 % Apply 1 application topically 2 (two) times daily.  For rash on left leg 05/15/16   Daleen Bo, MD  zaleplon (SONATA) 5 MG capsule Take 1 capsule (5 mg total) by mouth at bedtime as needed for sleep. 04/30/16   Maren Reamer, MD     Objective:   Vitals:   10/01/16 1156  BP: 133/84  Pulse: 70  Resp: 16  Temp: 98.2 F (36.8 C)  TempSrc: Oral  SpO2: 96%  Weight: (!) 318 lb 6.4 oz (144.4 kg)    Exam General appearance : Awake, alert, not in any distress. Speech Clear. Not toxic looking, morbid obesity. Less anxious appearing today than most days. HEENT: Atraumatic and Normocephalic, pupils equally reactive to light. Neck: supple, no JVD.  Chest:Good air entry bilaterally, no added sounds. CVS: S1 S2 regular, no murmurs/gallups or rubs. Abdomen: Bowel sounds active, obese, Non tender and not distended with no gaurding, rigidity or rebound. Extremities: B/L Lower Ext shows no edema, both legs are warm to touch. No noticible knot behind the left knee, no edema noted, but limited exam 2nd to body habitus. Neurology: Awake alert, and oriented X 3, CN II-XII grossly intact, Non focal Skin:No Rash  Data Review Lab Results  Component Value Date   HGBA1C 5.6 06/27/2016    Depression screen [facility] 2/9 10/01/2016 08/14/2016 07/05/2016 07/05/2016 05/24/2016  Decreased Interest 1 1 1 1 2   Down, Depressed, Hopeless 2 1 2 2 2   PHQ - 2 Score 3 2 3 3 4   Altered sleeping 3 2 3 3 2   Tired, decreased energy 1 1 1 1 2   Change in appetite 0 0 1 1 2   Feeling bad or failure about yourself  1 0 0 0 2  Trouble concentrating 1 0 1 1 2   Moving slowly or fidgety/restless 0 0 0 0 1  Suicidal thoughts 0 0 0 0 0  PHQ-9 Score 9 5 9 9 15   Difficult doing work/chores - - - - Somewhat difficult  Some recent data might be hidden      Assessment & Plan   1. Arthralgia of left lower leg - VAS [place] LOWER EXTREMITY VENOUS (DVT); Future - r/o dvt (doubtful since on Xarelto), may be popliteal cyst.  2. [place] Suspect viral syndrome, mucinex 600mg  bid recd, has  inhalers at home - gave her rx for azithromycin zpack to fill in case this worsens in next 5 days or so,   3. pulm htn w/ chronic pe/chronic chest pain syndrone - continue to fu w/ cards and pulm, has repeat echo pending - continue xarelto  4. Anxiety/depression - still pending psychiatry appt, suspect reactionary, but also may have component of panic attacks as well - added elavil 50mg  qhs (for  insomnia/fribromyalgia), but I am hoping it will work w/ her cymbalta to help w/ anxiety/depression as well. Pt instructed to try 1/2 dose first, may be enough for her, to avoid oversedation. - Jasmine sw to see pt today as well, appreciate help.  Patient have been counseled extensively about nutrition and exercise  Return in about 2 months (around 11/29/2016).  The patient was given clear instructions to go to ER or return to medical center if symptoms don't improve, worsen or new problems develop. The patient verbalized understanding. The patient was told to call to get lab results if they haven't heard anything in the next week.   This note has been created with Surveyor, quantity. Any transcriptional errors are unintentional.   Maren Reamer, MD, [facility] [place], [place]   10/01/2016, 12:38 PM

## 2016-10-02 NOTE — BH Specialist Note (Signed)
Session Start time: 11:55 AM   End Time: 12:20 PM Total Time:  25 minutes Type of Service: [name] [name] Interpreter: No.   Interpreter Name & Language: N/A # [facility] Visits July 2017-June 2018: 1st   SUBJECTIVE: [name] [name] is a 56 y.o. female  Pt. was referred by Dr. Janne [name] for:  anxiety and depression. Pt. reports the following symptoms/concerns: overwhelming feelings of sadness and worry, difficulty sleeping, low energy, and irritability  Duration of problem:  Ongoing Severity: moderate Previous treatment: Pt participated in medication management in the past. Not currently receiving behavioral health services.   OBJECTIVE: Mood: Anxious & Affect: Appropriate Risk of harm to self or others: Pt denied SI/HI Assessments administered: PHQ-9; GAD-7  LIFE CONTEXT:  Family & Social: Pt resides with her spouse. She and husband recently obtained full custody of their 28 year old grandson. Pt has an adult son who travels out of the state often for employer. School/ Work: Pt and spouse are on a fixed income. Pt receives Medicaid for minor grandchild Self-Care: Pt has difficulty sleeping. She reports chronic pain and frequent chest pain that prevents pt from exercising. No report of substance use Life changes: Pt has increased anxiety due to medical health concerns What is important to pt/family (values): Family, Spirituality, Good Health   GOALS ADDRESSED:  Decrease symptoms of depression Decrease symptoms of anxiety  INTERVENTIONS: Solution Focused, Strength-based and Supportive   ASSESSMENT:  Pt currently experiencing depression and anxiety triggered by decline in physical health and chronic pain. Pt reports overwhelming feelings of sadness and worry, difficulty sleeping, low energy, and irritability. Pt may benefit from psychoeduction, psychotherapy, and medication management. LCSWA educated pt on the cycle of depression and anxiety and discussed healthy  coping skills to decrease symptoms. Pt verbalized understanding of how mental and physical health correlates to one another. Pt verbalized transportation barriers. LCSWA discussed benefits of SCAT and provided pt with application. Pt was also provided community resources for food insecurity, crisis intervention, therapy, and medication management.     PLAN: 1. F/U with behavioral health clinician: Pt was encouraged to contact LCSWA if symptoms worsen or fail to improve to schedule behavioral appointments at [name]. 2. Behavioral Health meds: Elavil and Cymbalta 3. Behavioral recommendations: LCSWA recommends that pt apply healthy coping skills discussed, complete SCAT application, and utilize community resources. Pt is encouraged to schedule follow up appointment with LCSWA and was made aware of psych referral from PCP 4. Referral: Brief Counseling/Psychotherapy, [name] [name], Problem-solving teaching/coping strategies, Psychoeducation and Supportive Counseling 5. From scale of 1-10, how likely are you to follow plan: [name] [name], MSW, [facility] 10/02/16 9:36 PM  Warmhandoff:   Warm Hand Off Completed.

## 2016-10-07 ENCOUNTER — Telehealth: Payer: Self-pay | Admitting: Internal Medicine

## 2016-10-07 ENCOUNTER — Ambulatory Visit (HOSPITAL_BASED_OUTPATIENT_CLINIC_OR_DEPARTMENT_OTHER)
Admission: RE | Admit: 2016-10-07 | Discharge: 2016-10-07 | Disposition: A | Discharge status: Home / Self Care | Payer: Medicare HMO | Source: Ambulatory Visit | Attending: Internal Medicine | Admitting: Internal Medicine

## 2016-10-07 ENCOUNTER — Ambulatory Visit (HOSPITAL_COMMUNITY)
Admission: RE | Admit: 2016-10-07 | Discharge: 2016-10-07 | Disposition: A | Discharge status: Home / Self Care | Payer: Medicare HMO | Source: Ambulatory Visit | Attending: Physician Assistant | Admitting: Physician Assistant

## 2016-10-07 DIAGNOSIS — E669 Obesity, unspecified: Secondary | ICD-10-CM | POA: Diagnosis not present

## 2016-10-07 DIAGNOSIS — I371 Nonrheumatic pulmonary valve insufficiency: Secondary | ICD-10-CM | POA: Insufficient documentation

## 2016-10-07 DIAGNOSIS — Z86711 Personal history of pulmonary embolism: Secondary | ICD-10-CM | POA: Insufficient documentation

## 2016-10-07 DIAGNOSIS — I071 Rheumatic tricuspid insufficiency: Secondary | ICD-10-CM | POA: Diagnosis not present

## 2016-10-07 DIAGNOSIS — M25562 Pain in left knee: Secondary | ICD-10-CM | POA: Diagnosis not present

## 2016-10-07 DIAGNOSIS — I272 Pulmonary hypertension, unspecified: Secondary | ICD-10-CM | POA: Diagnosis not present

## 2016-10-07 DIAGNOSIS — R6889 Other general symptoms and signs: Secondary | ICD-10-CM | POA: Diagnosis not present

## 2016-10-07 MED ORDER — PERFLUTREN LIPID MICROSPHERE
1.0000 mL | INTRAVENOUS | Status: AC | PRN
  Administered 2016-10-07: 3 mL via INTRAVENOUS
  Filled 2016-10-07: qty 10

## 2016-10-07 NOTE — Telephone Encounter (Signed)
Candince from [facility] called to let pt. PCP know that the test for DVT was negative.

## 2016-10-07 NOTE — Progress Notes (Signed)
  Echocardiogram 2D Echocardiogram has been performed with definity.  [name] [name] 10/07/2016, 4:35 PM

## 2016-10-07 NOTE — Progress Notes (Signed)
VASCULAR LAB PRELIMINARY  PRELIMINARY  PRELIMINARY  PRELIMINARY  Left lower extremity venous duplex completed.    Preliminary report:  There is no DVT, SVT, or Baker's cyst noted in the left lower extremity.   Called report to [facility], [place], RVT 10/07/2016, 2:24 PM

## 2016-10-08 NOTE — Telephone Encounter (Signed)
Will forward to pcp

## 2016-10-09 ENCOUNTER — Telehealth (HOSPITAL_COMMUNITY): Payer: Self-pay

## 2016-10-09 ENCOUNTER — Other Ambulatory Visit: Payer: Self-pay | Admitting: Pharmacist

## 2016-10-09 NOTE — Telephone Encounter (Signed)
PT L/M to have new appt scheduled. Called pt back @ 946am and l/m on v/mail with contact info

## 2016-10-09 NOTE — Patient Outreach (Signed)
Receive a call from [name] requesting a call back about her medications. Called and spoke with patient. HIPAA identifiers verified and verbal consent received.  [name] reports that she was given samples of her Xarelto by her Cardiologist and that she currently has about two weeks worth of these samples left. However, reports that she went to her pharmacy to pick up her Xarelto prescription and was told that her copayment was $133. Patient states that she left the pharmacy without picking up the prescription as she is unable to pay this each month.   Call [employer] Part D on patient's behalf. Per [facility] representative, given that the patient has partial extra help, she has a reduced deductible of $83. So the current copayment is the combination of her deductible plus her $47 copay. Let [name] know that after this payment, her monthly copayment for the Xarelto should be $47/month. [name] expresses understanding and states that she will try to figure out how to come up with the money for this deductible. Again discuss with patient the importance of medication adherence.   Let [name] know that I will call to follow up with her next Monday. [name] expresses appreciation. Confirm that patient still has my phone number.  [name] [name], PharmD, [facility] [phone]

## 2016-10-10 ENCOUNTER — Other Ambulatory Visit: Payer: Self-pay

## 2016-10-10 NOTE — Patient Outreach (Signed)
[facility]) Care Management  10/10/16  Ronalyn Kintner  [birth year] [id number]  Attempted to reach patient without success. Left HIPAA compliant voicemail with RNCM contact information and invited patient to callback.  [name] Kharis Lapenna, RN, BSN, [facility] Coordinator [phone]

## 2016-10-14 ENCOUNTER — Other Ambulatory Visit: Payer: Self-pay | Admitting: Pharmacist

## 2016-10-14 ENCOUNTER — Ambulatory Visit: Payer: Self-pay | Admitting: Pharmacist

## 2016-10-14 ENCOUNTER — Telehealth: Payer: Self-pay | Admitting: Internal Medicine

## 2016-10-14 MED ORDER — SALINE SPRAY 0.65 % NA SOLN: 1.0000 | NASAL | 11 refills | Status: DC | PRN

## 2016-10-14 NOTE — Telephone Encounter (Signed)
[name] [name] pharmacist from [facility] called concerned about pt, pt has been having blood clots with cramping( also mild nose bleeds) and pt is on xarelto. Please f/up with patient

## 2016-10-14 NOTE — Telephone Encounter (Signed)
Will forward to pcp

## 2016-10-14 NOTE — Telephone Encounter (Signed)
Please call. Pt does not have blood clot on  Left leg on recent ultrasound. Needs to keep air humidified to keep her nose from become dry and preventing nose bleeds. Saline nasal spray 2-3 x day helps as well to keep the nose moist - rx for saline nasal spray, but also available otc.  thanks

## 2016-10-14 NOTE — Patient Outreach (Signed)
Call to follow up with [name] [name] regarding her Xarelto prescription. Spoke with patient. HIPAA identifiers verified and verbal consent received.  [name] [name] reports that she has not yet picked up her Xarelto prescription from her pharmacy because she has not been able to afford the $133 to cover her deductible and copayment. Reports that she has been using the samples that were provided to her by her physician, but that she will not be able to get enough money together to afford this deductible payment.  Place a phone call to Dr. Deanne Coffer, Assistant Clinical Director of Pharmacy Services, to request approval to use the pharmacy emergency fund to purchase a 1 month supply of Xarelto for this patient. Patient is approved for this 1 month supply emergency fund due to financial need.   Let [name] [name] know that [facility] will be able to provide her with a one time refill of her Xarelto for this month. Counsel [name] [name] on the importance of medication adherence and discuss the importance of planning ahead for her upcoming monthly $47 copayments for Xarelto. Patient verbalizes understanding and states that she will be able to afford these future copayments.   [name] [name] reports that over the weekend she has been having vaginal cramping and has been passing bright red blood clots. Reports that this is particularly abnormal for her as she has had a hysterectomy. Reports that she has not had any of these clots today. However, reports that she has also been having some bleeding from her nose over the past week. Reports that she has had a cold and that when she blows her nose, she has frequently noticed blood on the tissue. Denies any further bleeding from her nose, rather than the blood that she wipes away. Review with patient concerns about increased bleeding with Xarelto. Patient reports that she has placed a call to her PCP, Dr. Janne Napoleon this morning, but has not yet heard back.  Let patient  know that I will reach out to her PCP as well. Let patient know that I will then close this pharmacy episode. Patient expresses appreciation for my help and confirms that she has my phone number.  PLAN  1) Patient to pick up 1 month supply of Xarelto as provided by the pharmacy emergency fund.  2) Will call patient's PCP's office regarding patient's report of vaginal cramping and passing bright red blood clots.  3) Will close pharmacy episode at this time.  Harlow Asa, PharmD, [facility] [phone]

## 2016-10-14 NOTE — Patient Outreach (Signed)
Call to patient's PCP's office regarding patient's report of vaginal cramping and passing bright red blood clots over the weekend. Leave a message with Deisey in the office. Let Ardine Eng know that the patient is on Xarelto. Request that provider/nurse reach out to patient directly.  Harlow Asa, PharmD, [facility] [phone]

## 2016-10-16 ENCOUNTER — Telehealth: Payer: Self-pay | Admitting: Internal Medicine

## 2016-10-16 ENCOUNTER — Encounter (HOSPITAL_COMMUNITY): Payer: Self-pay | Admitting: Internal Medicine

## 2016-10-16 ENCOUNTER — Encounter (HOSPITAL_COMMUNITY): Payer: Self-pay

## 2016-10-16 ENCOUNTER — Other Ambulatory Visit (HOSPITAL_COMMUNITY): Payer: Self-pay

## 2016-10-16 ENCOUNTER — Ambulatory Visit (HOSPITAL_COMMUNITY)
Admission: RE | Admit: 2016-10-16 | Discharge: 2016-10-16 | Disposition: A | Discharge status: Home / Self Care | Payer: Medicare HMO | Source: Ambulatory Visit | Attending: Internal Medicine | Admitting: Internal Medicine

## 2016-10-16 VITALS — BP 148/72 | HR 100 | Wt 320.5 lb

## 2016-10-16 DIAGNOSIS — F329 Major depressive disorder, single episode, unspecified: Secondary | ICD-10-CM | POA: Diagnosis not present

## 2016-10-16 DIAGNOSIS — I272 Pulmonary hypertension, unspecified: Secondary | ICD-10-CM | POA: Diagnosis not present

## 2016-10-16 DIAGNOSIS — I10 Essential (primary) hypertension: Secondary | ICD-10-CM | POA: Diagnosis not present

## 2016-10-16 DIAGNOSIS — G894 Chronic pain syndrome: Secondary | ICD-10-CM | POA: Diagnosis not present

## 2016-10-16 DIAGNOSIS — M797 Fibromyalgia: Secondary | ICD-10-CM | POA: Insufficient documentation

## 2016-10-16 DIAGNOSIS — Z6841 Body Mass Index (BMI) 40.0 and over, adult: Secondary | ICD-10-CM | POA: Diagnosis not present

## 2016-10-16 DIAGNOSIS — G8929 Other chronic pain: Secondary | ICD-10-CM | POA: Diagnosis present

## 2016-10-16 DIAGNOSIS — G4733 Obstructive sleep apnea (adult) (pediatric): Secondary | ICD-10-CM | POA: Diagnosis not present

## 2016-10-16 DIAGNOSIS — F419 Anxiety disorder, unspecified: Secondary | ICD-10-CM | POA: Diagnosis not present

## 2016-10-16 DIAGNOSIS — M069 Rheumatoid arthritis, unspecified: Secondary | ICD-10-CM

## 2016-10-16 DIAGNOSIS — R0602 Shortness of breath: Secondary | ICD-10-CM

## 2016-10-16 DIAGNOSIS — R079 Chest pain, unspecified: Secondary | ICD-10-CM | POA: Insufficient documentation

## 2016-10-16 MED ORDER — DICLOFENAC EPOLAMINE 1.3 % TD PTCH: 1.0000 | MEDICATED_PATCH | Freq: Two times a day (BID) | TRANSDERMAL | 6 refills | Status: DC

## 2016-10-16 NOTE — Telephone Encounter (Signed)
Patient called the office to speak with PCP regarding Cpap machine supplies. Pt stated that she has the machine but has yet to receive the supplies. Please follow up.  Thank you.

## 2016-10-16 NOTE — Progress Notes (Signed)
ADVANCED HF CLINIC NOTE  Date:  10/16/2016   ID:  [name] [name], DOB 06/21/1961, MRN [id number]  PCP:  [name] Reamer, MD    HPI:  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 These symptoms progressively worsened over several days prompting her to go to [facility] in her then-hometown of [place] in 09/2015 where she was diagnosed with PE and started on anticoagulation. She was very briefly on warfarin but quickly switched to Francetta Found was referred to [facility] in 12/2015. Patient refused LHC because of financial fears and unclear insurance status. Echocardiogram (12-2015) showed normal LVEF 55-60%, normal LA, normal RV size, RVSP estimated as 70 mm Hg. She further was referred to [facility] and saw Dr. Karena Addison on 03/14/2016 where patient had several studies including V/Q scan which found high prob for PE; studies c/w CTEPH. It was felt that her chronic chest pain was caused by chronic PE. RHC/LHC/Pulm Angiogram 03/2016 confirmed diagnosis of CTEPH. She underwent pulmonary thromboembolectomy 03/31/2016 at [facility] with IVC filter placement. Pre-op cath with no CAD.   Seen by Dr. Karena Addison in 04/2016 for follow up. He felt like her heart size was decreasing. Plan was for lifelong [place] and ECHO and V/Q in 4 months. Dr. Karena Addison has since left the [facility] practice.  CT angio at [facility] on 05/15/16 showed " persistent but partially recanalized PE within right lower lobe pulmonary artery. No new PE identified. "  She was admitted to [facility] in 06/2016 for continued chest pain and parasthesias. V/Q scan on 06/27/16 showed prominent ventilation  perfusion mismatch in right c/w PE. Head CT negative. She was continued on Xarelto and told to follow up with [facility]. Patient doesn't want to have to travel to [facility] for care so [facility] care here in [place].   Saw [facility] recently for ongoing CP. Referred here for further management of CP and PAH. Echo 10/07/16. EF 55%. RV normal RVSP 62mmHG  Says her SOB is coming back. She has gained some weight back. Main complaint is chest pain. Says it happens all day long. Comes and goes all day. Sharp, shooting pains. Worse with deep breathing. Doesn't get worse with walking. Occasional edema. Trying to get supplies for CPAP.    Past Medical History:  Diagnosis Date  . Arthritis   . Depression   . Diverticulitis   . Hypertension   . Obesity   . Pneumonia   . Pulmonary embolism [facility])     Past Surgical History:  Procedure Laterality Date  . ABDOMINAL HYSTERECTOMY    . BREAST REDUCTION SURGERY    . CHOLECYSTECTOMY    . EMBOLECTOMY N/A    pulmonary embolectomy    Current Medications:  (Not in an outpatient encounter)   Allergies:   [name].   Social History   Social History  . Marital status: Married    Spouse name: N/A  . Number of children: N/A  . Years of education: N/A   Social History Main Topics  . Smoking status: Never Smoker  . Smokeless tobacco: Never Used  . Alcohol use No  . Drug use:  No  . Sexual activity: Not on file   Other Topics Concern  . Not on file   Social History Narrative  . No narrative on file     Family History:  The patient's family history includes Lung cancer in her maternal grandmother.      ROS:   Please see the history of present illness.    ROS All other systems reviewed and are negative.   PHYSICAL EXAM:   VS:  BP (!) 148/72   Pulse 100   Wt (!) 320 lb 8 oz (145.4 kg)   SpO2 98%   BMI 53.33 kg/m    GEN: Well nourished, well developed, in no acute distress, morbidly obese.  HEENT: normal    Neck: neck thik. No obvious JVD, carotid bruits, or masses Cardiac: RRR; no murmurs, rubs, or gallops,no edema  Respiratory:  clear to auscultation bilaterally, normal work of breathing GI: obese soft, nontender, nondistended, + BS MS: no deformity or atrophy  Skin: warm and dry, no rash Neuro:  Alert and Oriented x 3, Strength and sensation are intact Psych: euthymic mood, full affect   Wt Readings from Last 3 Encounters:  10/01/16 (!) 318 lb 6.4 oz (144.4 kg)  09/24/16 (!) 314 lb 9.6 oz (142.7 kg)  09/24/16 (!) 314 lb 12.8 oz (142.8 kg)      Studies/Labs Reviewed:   EKG:  EKG is ordered today.  The ekg ordered today demonstrates NSR, RBBB, HR 77   Recent Labs: 03/20/2016: B Natriuretic Peptide 88.3 06/26/2016: BUN 12; Creatinine, Ser 0.88; Hemoglobin 11.5; Platelets 335; Potassium 3.9; Sodium 140 06/27/2016: ALT 9   Lipid Panel    Component Value Date/Time   CHOL 166 06/27/2016 0346   TRIG 125 06/27/2016 0346   HDL 33 (L) 06/27/2016 0346   CHOLHDL 5.0 06/27/2016 0346   VLDL 25 06/27/2016 0346   LDLCALC 108 (H) 06/27/2016 0346    Additional studies/ records that were reviewed today include:   2D ECHO: 12/15/2015 LV EF: 55% -   60% Study Conclusions - Left ventricle: The cavity size was normal. Wall thickness was normal. Systolic function was normal. The estimated ejection  fraction was in the range of 55% to 60%. - Pulmonary arteries: PA peak pressure: 70 mm Hg (S). - Pericardium, extracardiac: A trivial pericardial effusion was   identified.   2D ECHO 04/05/16 INTERPRETATION:NORMAL LEFT VENTRICULAR FUNCTION WITH MILD LVHSEVERE RV SYSTOLIC DYSFUNCTION (See above) VALVULAR REGURGITATION: TRIVIAL PR, MILD TR NO VALVULAR STENOSIS TRIVIAL PERICARDIAL EFFUSION Compared with prior Echo study on 03/14/2016: RVSP increased from 50 to 69   CATH right heart and coronary angiography: 03/25/2016 [place] Component Name Value Ref Range  Cardiac  Index (l/min/m2) 2.3 L/min/m2  Right Atrium Mean Pressure (mmHg) 11 mmHg   Right Ventricle Systolic Pressure (mmHg) 68 mmHg   Pulmonary Artery Mean Pressure (mmHg) 40 mmHg   Pulmonary Wedge Pressure (mmHg) 14 mmHg   Pulmonary Vascular Resistance (Wood units) 4.7    CT angio 05/15/16 IMPRESSION: Persistent but partially recanalized pulmonary embolus within the right lower lobe pulmonary artery. No new pulmonary embolism is identified. Minimal bibasilar atelectatic changes stable from the prior exam.   V/Q scan 06/27/16 IMPRESSION: Prominent ventilation perfusion mismatch noted on the right. Finding suggests high probability right-sided pulmonary embolus in this patient with known right-sided pulmonary embolus.   ASSESSMENT & PLAN:   1. Chronic chest pain:  --LHC performed at [facility] 7/17 - no CAD --per [facility] notes in [place], felt  to be from her chronic PEs and pulmonary HTN.  --I suspect this is more musculoskeletal/arthritic will start with topical NSAID (diclofenac). If that fails can try topical lidoderm patch. Discussed with PharmD   2. 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 7/17 --on Xarelto. Can stop ASA --Echo with RVPS 51mmHG. Will proceed with RHC to confirm pulmonary pressures and see if need for any adjuvant PAH therapy  3. HTN:  --BP mildly elevated. Will follow  4. Fibromyalgia/Rheumatoid Arthitis:  - Recently diagnosed. Sees rheumatology 12/2016 to see if any autoimmune process is contributing to her chronic pain.  - Will start topical NSAIDs for presumed arthritic pain in chest  5. Morbid obesity with OHS:  - encouraged to lose weight. Gave her info on bariatrics program   6. OSA - stressed need to get supplies to restart CPAP  Willmar Stockinger,MD 3:48 PM

## 2016-10-16 NOTE — Telephone Encounter (Signed)
Contacted pt and went over Dr. Janne Napoleon advice pt is aware and doesn't have any questions or concerns

## 2016-10-16 NOTE — Patient Instructions (Signed)
START Flector Patch 1.3% twice daily.  Right heart catheterization scheduled for Monday February 19th. See attached sheet for instructions.  Follow up 2-3 months with Dr. Haroldine Laws.  Do the following things EVERYDAY: 1) Weigh yourself in the morning before breakfast. Write it down and keep it in a log. 2) Take your medicines as prescribed 3) Eat low salt foods-Limit salt (sodium) to 2000 mg per day.  4) Stay as active as you can everyday 5) Limit all fluids for the day to less than 2 liters

## 2016-10-17 ENCOUNTER — Telehealth (HOSPITAL_COMMUNITY): Payer: Self-pay | Admitting: Pharmacist

## 2016-10-17 ENCOUNTER — Other Ambulatory Visit: Payer: Self-pay

## 2016-10-17 NOTE — Telephone Encounter (Signed)
Will forward to pcp

## 2016-10-17 NOTE — Patient Outreach (Signed)
[facility]) Care Management  [place]  10/17/2016   [name] [name] Dec 10, 1960 [id number]  Subjective: Home visit completed with patient.  Objective: Blood pressure 120/64, pulse 63, resp. rate 18, height 1.651 m (5\' 5" ), weight (!) 314 lb (142.4 kg), SpO2 95 %.  Encounter Medications:  Outpatient Encounter Prescriptions as of 10/17/2016  Medication Sig  . acetaminophen-codeine (TYLENOL #3) 300-30 MG tablet Take 1 tablet by mouth every 8 (eight) hours as needed for moderate pain. Max total Tyelnol < 2000mg  /day  . albuterol (PROVENTIL HFA;VENTOLIN HFA) 108 (90 Base) MCG/ACT inhaler Inhale 1-2 puffs into the lungs every 6 (six) hours as needed for wheezing or shortness of breath.  [place] Kitchen amitriptyline (ELAVIL) 50 MG tablet Take 1 tablet (50 mg total) by mouth at bedtime. Try 1/2 tab first, if enough keep 1/2 dose. If not, than increase to full dose.  Marland Kitchen amLODipine (NORVASC) 5 MG tablet Take 1 tablet (5 mg total) by mouth daily.  Marland Kitchen atorvastatin (LIPITOR) 20 MG tablet Take 1 tablet (20 mg total) by mouth daily at 6 PM.  . diclofenac (FLECTOR) 1.3 % PTCH Place 1 patch onto the skin 2 (two) times daily.  . diclofenac sodium (VOLTAREN) 1 % GEL Apply 2 g topically 4 (four) times daily.  . diphenhydramine-acetaminophen (TYLENOL PM) 25-500 MG TABS tablet Take 1 tablet by mouth at bedtime as needed (sleep).  . docusate sodium (COLACE) 100 MG capsule Take 100 mg by mouth 2 (two) times daily.   . DULoxetine (CYMBALTA) 60 MG capsule TAKE ONE CAPSULE BY MOUTH ONCE DAILY  . furosemide (LASIX) 40 MG tablet Take 1 tablet (40 mg total) by mouth daily.  Marland Kitchen gabapentin (NEURONTIN) 300 MG capsule Take 1 capsule (300 mg total) by mouth 3 (three) times daily.  Marland Kitchen guaiFENesin (MUCINEX) 600 MG 12 hr tablet Take 1 tablet (600 mg total) by mouth 2 (two) times daily.  Marland Kitchen loperamide (IMODIUM A-D) 2 MG tablet Take 1 tablet (2 mg total) by mouth 4 (four) times daily as needed for diarrhea or loose stools.   . metoprolol tartrate (LOPRESSOR) 25 MG tablet Take 25 mg by mouth daily.  . potassium chloride (K-DUR) 10 MEQ tablet Take 1 tablet (10 mEq total) by mouth 2 (two) times daily.  . rivaroxaban (XARELTO) 20 MG TABS tablet Take 1 tablet (20 mg total) by mouth daily with supper.  . sodium chloride (OCEAN) 0.65 % SOLN nasal spray Place 1 spray into both nostrils as needed for congestion.  . triamcinolone cream (KENALOG) 0.1 % Apply 1 application topically 2 (two) times daily. For rash on left leg  . zaleplon (SONATA) 5 MG capsule Take 1 capsule (5 mg total) by mouth at bedtime as needed for sleep.   No facility-administered encounter medications on file as of 10/17/2016.     Functional Status:  In your present state of health, do you have any difficulty performing the following activities: 09/24/2016 08/22/2016  Hearing? N N  Vision? N N  Difficulty concentrating or making decisions? N N  Walking or climbing stairs? N N  Dressing or bathing? N N  Doing errands, shopping? - Facilities manager and eating ? - -  Using the Toilet? - -  In the past six months, have you accidently leaked urine? - -  Do you have problems with loss of bowel control? - -  Managing your Medications? - -  Managing your Finances? - -  Housekeeping or managing your Housekeeping? - -  Some  recent data might be hidden    Fall/Depression Screening: PHQ 2/9 Scores 10/01/2016 08/14/2016 07/05/2016 07/05/2016 05/24/2016 05/17/2016 05/16/2016  PHQ - 2 Score 3 2 3 3 4 4 1   PHQ- 9 Score 9 5 9 9 15 14 8     Assessment: Patient stated that she has been doing ok. Stated that she has started going to the heart failure clinic and saw Dr. Haroldine Laws. Stated that she is scheduled to have a heart catheterization on 10/28/2016.  Patient stated that she continues to have chest pain as before. Stated that she discussed this with Dr. Haroldine Laws and he indicated that it may be arthritis and not related to her heart. She was given a referral to  rheumatology and has an appointment in April. She continued to state that she still does not have her CPAP supplies, but was told how important it was to be back on her CPAP machine. She stated that she had received a call from someone and cannot remember who they were or where they were calling from asking if she has a machine but she has not heard anything since. Per a review of records, her PCP has written a prescription for her supplies and submitted them to [facility].   RNCM contacted [place] [facility] at [phone]. Spoke with Angie who stated that they have the prescription and can send out the supplies, but have not yet done so. She stated that patient is eligible to receive the filters, mask, and tubing. They did not have the correct insurance information or home address for the patient. RNCM assisted with updating their information for the patient. She submitted the order and the supplies should arrive within the next 5 days.  RNCM updated patient on status and advised that if she does not receive the supplies next week, she would need to contact [place] [facility] again to check the status of her order. Patient expressed gratitude for assistance with CPAP supplies.  Patient also stated that Dr. Haroldine Laws had ordered her some new patches for her pain. She stated that the Flector patches had been called in, but she has called the pharmacy several times and they are not ready. She contacted the pharmacy while RNCM present for home visit and she was told that they had been trying to submit her prescription to her insurance every hour and had submitted it 4-5 times, but it keeps getting denied. Stated that they would continue to try to submit it. Per review, [name], clinical pharmacist was working on the prior authorization through [employer] and it was approved. RNCM attempted to reach [facility] and left a voicemail advising wanting to follow up on this PA.   Patient stated  that she had been taken off her aspirin since she would be on the blood thinner for the rest of her life. She reports that she is taking all other medications as prescribed. Patient also verbalized understanding that she is not to take the blood thinner on the day before her heart cath.   Patient also reported that she and Dr. Mahalia Longest discussed her weight. She stated that for years she has considered weight loss surgery and understands that she needs to lose some weight. Stated that she was given literature about the bariatric program and she is interested in pursuing that in the future. She stated that she wants to ensure that her heart issues are resolved before seeking weight loss surgery.  Patient stated that she had an appointment on 2/9  to get her port flushed. However, she contacted them and advised she is having a heart cath on 2/19 and can get it flushed at that time. She is to call and reschedule if for some reason she is unable to go in for the catheterization. Her next scheduled port flush is 11/15/2016.   Patient stated that she has "finally decided that I want help" for her depression. She reported that she talked with her doctor about needing a therapist and she has a referral to [employer] [facility]. Stated that her first appointment will be on 3/1.   Today, patient reported that her weight was 314. She stated that it was 309 on Monday. She also reports feeling more short of breath recently and "just feels weak." She reports some dizziness when up moving around today. Patient did see Dr. Haroldine Laws yesterday and stated that she discussed this with him, including her weight gain. RNCM reviewed Dr. Clayborne Dana instructions with patient, including daily weights, fluid restrictions, salt intake, taking medications as prescribed and activity. RNCM educated patient to weigh herself daily first thing in the morning after going to the bathroom, but before eating. Patient stated that her weight was 320  lbs at her appointment yesterday. RNCM assessed how patient has been weighing herself at home and she stated that normally she weighs without any clothes on in the morning. RNCM educated on the importance of weighing consistently at home - advised that if her preference is to weigh with no clothing on, to make sure she is doing that each day so she will have more accurate readings of fluctuations in her weight.  Encouraged to write daily weights in her [facility] book and to take that with her to her appointments. Patient stated she was unaware that she was on fluid restrictions and stated that already watches her salt intake. Educated regarding limiting fluids to less than 2 liters per day and patient verbalized understanding.   Plan: Patient to contact RNCM next week of she has not received her CPAP supplies. RNCM to follow up with pharmacy to verify if Flector patches have gone through and patient can pick them up. Patient to weigh herself daily and write them down.  Patient to limit fluids to 2 liters per day and salt to less than 2000 mg per day.  Telephone outreach scheduled for next week.  [name] R. Eliazer Hemphill, RN, BSN, [employer] Management Coordinator [phone]

## 2016-10-17 NOTE — Telephone Encounter (Addendum)
Flector patch PA approved by [employer] Part D through 10/18/18. Verified paid claim with [facility] although copay is $97.   [name] K. Velva Harman, PharmD, BCPS, CPP Clinical Pharmacist Pager: [phone] Phone: [phone] 10/17/2016 10:27 AM

## 2016-10-18 ENCOUNTER — Ambulatory Visit (HOSPITAL_COMMUNITY)
Admission: RE | Admit: 2016-10-18 | Discharge: 2016-10-18 | Disposition: A | Discharge status: Home / Self Care | Payer: Medicare HMO | Source: Ambulatory Visit | Attending: Internal Medicine | Admitting: Internal Medicine

## 2016-10-18 ENCOUNTER — Other Ambulatory Visit: Payer: Self-pay

## 2016-10-18 NOTE — Patient Outreach (Signed)
[facility]) Care Management  10/18/16  [name] [name] 1961-03-09 [id number]  Received voicemail from patient returning call. Successful outreach completed with patient.Patient reported that she is feeling better today. RNCM provided information regarding her Flector patches and the cost. She stated that she was going to call the pharmacy and see what she could do. RNCM encouraged patient to callback if she needs further assistance.  No other concerns or needs at present. Outreach call scheduled for next week.  [name], RN, BSN, [place] Management Coordinator [phone]

## 2016-10-18 NOTE — Patient Outreach (Signed)
[facility]) Care Management  10/18/16  [name] [name] [birth year] [id number]  RNCM received inbound call from [name], Clinical Pharmacist returning call. Per [name], she completed the prior authorization for patient's Flector patch through [facility] Part D and it was having trouble going through at patient's pharmacy yesterday. She stated that it has gone through now and patient can pick up her patches. However, they do have a $97 copay. She stated that if the patient does not feel she needs to purchase 60 patches for a 1 month supply, she can contact the pharmacy and request that they only fill 30 for as needed use. This will help patient with the copay amount. RNCM advised would contact patient to discuss.  RNCM attempted to contact patient without success. Left HIPAA compliant voicemail wit RNCM contact information and requested callback.  [name], RN, BSN, [place] Management Coordinator [phone]

## 2016-10-21 ENCOUNTER — Other Ambulatory Visit: Payer: Self-pay

## 2016-10-21 NOTE — Telephone Encounter (Signed)
[name] [name] - could you look into this? Supplies? She has the machine now. thanks

## 2016-10-21 NOTE — Telephone Encounter (Signed)
Call placed to [place] [facility] to inquire about the status of the CPAP supplies.  This CM spoke to [place] who stated that they are behind with orders and have not processed the order for this patient yet.  Inquired if any additional information is needed and she stated that they need the physician's NPI # on the prescription and notes from a visit that the patient had with the provider within the past 6 months that references CPAP. [name] [name] stated that the patient can call them to check on the status of the referral at # [phone].   The updated prescription and visit notes were faxed to [facility]  - fax # [phone].   Call placed to the patient and informed her of the status of the request for supplies and provided her with the contact # for [facility].

## 2016-10-21 NOTE — Patient Outreach (Signed)
[facility]) Care Management  10/21/16  [name] [name] Jul 15, 1961 [id number]  Patient contacted the 24 hour call line on 10/20/2016 with complaints of rectal bleeding. Patient reported felt like she had a cut in her rectum and was having blood without having bowel movements. Patient reported history of hemorrhoids and also on Xarelto. She was instructed to go [facility] Emergency Department.  RNCM successfully outreached to patient. Patient identification verified.  Patient reported that she did not go to the emergency department as instructed. However, she does continue to complain of rectal bleeding. She stated she had hoped it would just quit and was "afraid to go to the emergency room." Today, she is reporting feeling fatigued and continuing to observe bright red blood when she goes to the bathroom. RNCM educated patient that since she is on Xarelto and experiencing bleeding from rectum x 2 days now and is reporting increased fatigue, she needs to be evaluated as soon as possible. Patient stated that she thinks it is just hemorrhoids and RNCM advised that even if it is, prolonged bleeding from the rectum is not normal and Xarelto increases her risk for bleeding. Patient verbalized understanding and stated she would have her husband take her. Encouraged to call with any questions or concerns.  RNCM will follow up with patient later this week.  [name] Jaydyn Bozzo, RN, BSN, [place] Management Coordinator [phone]

## 2016-10-22 ENCOUNTER — Telehealth: Payer: Self-pay

## 2016-10-22 DIAGNOSIS — M1711 Unilateral primary osteoarthritis, right knee: Secondary | ICD-10-CM | POA: Diagnosis not present

## 2016-10-22 DIAGNOSIS — M1712 Unilateral primary osteoarthritis, left knee: Secondary | ICD-10-CM | POA: Diagnosis not present

## 2016-10-22 DIAGNOSIS — M5136 Other intervertebral disc degeneration, lumbar region: Secondary | ICD-10-CM | POA: Diagnosis not present

## 2016-10-22 NOTE — Telephone Encounter (Signed)
Call placed to [facility] # [phone] , spoke to [place] who confirmed receipt of the fax yesterday with the updated prescription and provider visit notes.    Called patient to let her know that [place] [facility] has received all requested documentation and they are still backlogged with referrals. Confirmed with her that she has the contact # [phone] to check on the status of the referral.    She then stated that she has been experiencing rectal bleeding since Saturday - 10/19/16. She noted that she has a history of hemorrhoids and and hemorrhoid surgery.  She has called the [facility] 24 hour nurse line on 10/19/16 and has spoken to her [place] and has been instructed to go to the ER.  She said that the rectal pain yesterday was 9-10/10 and today is 6/10 and it feels like she has a "tear" and she is being cut by " glass."  She noted that the pain in constant.  Explained to her the importance of seeking medical attention in the ER.  She said that she understands she should go and knows that she is on medication that could contribute to the bleeding but she does not want to go to the ER.  She said that this bleeding and pain is affecting her mentally and physically but she still doesn't want to go to the ER. She said that she has custody of her 84 yo grandson and needs to be home for him.  She then said that her husband can take her to the ER but she does not want to wait in the ER alone while he gets her grandson.  Again, stressed the importance of going to the ER for medical attention for herself.  She said that she understands that she needs to go to the ER  she would think about it.  Informed her that this message would be sent to Dr Janne Napoleon

## 2016-10-23 ENCOUNTER — Other Ambulatory Visit: Payer: Self-pay

## 2016-10-23 NOTE — Patient Outreach (Addendum)
[facility]) Care Management  10/23/16  [name] [name] 1961/01/21 [id number]  Successful outreach completed with patient to follow up regarding rectal bleeding reported earlier this week. Patient identification verified.  Patient stated that she has been busy today. She reported that she was approved today for transportation through [employer]. She is hoping this will help since she only has one car and her husband is having to take her everywhere.   She also reported that she had talked with the pharmacy regarding the Flector patches and she still cannot afford getting only half of them. She stated  It was still going to cost $53 and she just does not have it right now. She stated she will try to get them at the beginning of the month.  Patient stated that she still has not gone to the emergency department. She stated that she knows she needs to be seen and understands the risks with taking the Xarelto, but she is scared to go to and sit in the ED. She reported that she spoke with the nurse from her PCP office and she also advised to go to the ED.   Patient reported that she is not noticing as much blood as she was before. Stated that she is now having a large, bloody bowel movement per day. She reported that she is no longer seeing blood in between bowel movements. She reports the blood in her stools as bright red with some darker "grainy" blood noticed also. She stated she feels it is most likely hemorrhoids and hates to sit in the emergency department for that. She stated that she is going to try to get her records from her last GI doctor because she has not established care with a new one since moving to [facility]. She reported that her last colonoscopy was 2 or more years ago and she had hemorrhoids then. She also reported that every time she has had one, she has had polyps removed. Patient also reported that she had hemorrhoid surgery in 2015.   RNCM  provided education again about Xarelto and the risk for bleeding. Encouraged to go to the emergency department for evaluation if bleeding continues or worsens. She initially stated she was going to try to go in to see her PCP, but stated she is aware she will need to see someone in GI. Patient and RNCM discussed possible causes for rectal bleeding and that with the increased risk while on blood thinners, it was best to err on the side of caution. Also advised may need to let Dr. Haroldine Laws know as she has a cardiac catheterization scheduled for Monday, 2/19. Patient agreeable to let [facility] notify him that she is currently actively bleeding in case the procedure needs to be rescheduled.   Patient stated she plans to seek medical treatment in the morning for her rectal bleeding after her grandson leaves for school. RNCM encouraged patient to call with any needs or concerns and she verbalized understanding.   Will keep scheduled outreach in a few days to follow up. RNCM will route note to Dr. Haroldine Laws since patient is experiencing some active rectal bleeding since 10/19/2016 and has a right heart catheterization scheduled for Monday, 10/28/16.  [name] R. Jaycob Mcclenton, RN, BSN, [place] Management Coordinator [phone]

## 2016-10-24 ENCOUNTER — Emergency Department (HOSPITAL_COMMUNITY)
Admission: EM | Admit: 2016-10-24 | Discharge: 2016-10-24 | Disposition: A | Discharge status: Home / Self Care | Payer: Medicare HMO | Attending: Emergency Medicine | Admitting: Emergency Medicine

## 2016-10-24 ENCOUNTER — Emergency Department (HOSPITAL_COMMUNITY): Payer: Medicare HMO

## 2016-10-24 ENCOUNTER — Encounter (HOSPITAL_COMMUNITY): Payer: Self-pay | Admitting: Emergency Medicine

## 2016-10-24 DIAGNOSIS — Z8673 Personal history of transient ischemic attack (TIA), and cerebral infarction without residual deficits: Secondary | ICD-10-CM | POA: Insufficient documentation

## 2016-10-24 DIAGNOSIS — Z79899 Other long term (current) drug therapy: Secondary | ICD-10-CM | POA: Insufficient documentation

## 2016-10-24 DIAGNOSIS — K625 Hemorrhage of anus and rectum: Secondary | ICD-10-CM | POA: Diagnosis present

## 2016-10-24 DIAGNOSIS — Z7901 Long term (current) use of anticoagulants: Secondary | ICD-10-CM | POA: Diagnosis not present

## 2016-10-24 DIAGNOSIS — K6289 Other specified diseases of anus and rectum: Secondary | ICD-10-CM

## 2016-10-24 DIAGNOSIS — I10 Essential (primary) hypertension: Secondary | ICD-10-CM | POA: Insufficient documentation

## 2016-10-24 DIAGNOSIS — R109 Unspecified abdominal pain: Secondary | ICD-10-CM | POA: Diagnosis not present

## 2016-10-24 LAB — CBC
HCT: 35.8 % — ABNORMAL LOW (ref 36.0–46.0)
Hemoglobin: 11.5 g/dL — ABNORMAL LOW (ref 12.0–15.0)
MCH: 21 pg — ABNORMAL LOW (ref 26.0–34.0)
MCHC: 32.1 g/dL (ref 30.0–36.0)
MCV: 65.3 fL — ABNORMAL LOW (ref 78.0–100.0)
Platelets: 257 10*3/uL (ref 150–400)
RBC: 5.48 MIL/uL — ABNORMAL HIGH (ref 3.87–5.11)
RDW: 19.6 % — ABNORMAL HIGH (ref 11.5–15.5)
WBC: 6.6 10*3/uL (ref 4.0–10.5)

## 2016-10-24 LAB — POC OCCULT BLOOD, ED: Fecal Occult Bld: POSITIVE — AB

## 2016-10-24 LAB — COMPREHENSIVE METABOLIC PANEL
ALT: 9 U/L — ABNORMAL LOW (ref 14–54)
AST: 16 U/L (ref 15–41)
Albumin: 3.3 g/dL — ABNORMAL LOW (ref 3.5–5.0)
Alkaline Phosphatase: 73 U/L (ref 38–126)
Anion gap: 9 (ref 5–15)
BUN: 11 mg/dL (ref 6–20)
CO2: 25 mmol/L (ref 22–32)
Calcium: 9.4 mg/dL (ref 8.9–10.3)
Chloride: 108 mmol/L (ref 101–111)
Creatinine, Ser: 1.04 mg/dL — ABNORMAL HIGH (ref 0.44–1.00)
GFR calc Af Amer: >60 mL/min (ref >60)
GFR calc non Af Amer: 60 mL/min — ABNORMAL LOW (ref >60)
Glucose, Bld: 73 mg/dL (ref 65–99)
Potassium: 4.1 mmol/L (ref 3.5–5.1)
Sodium: 142 mmol/L (ref 135–145)
Total Bilirubin: 0.4 mg/dL (ref 0.3–1.2)
Total Protein: 6.3 g/dL — ABNORMAL LOW (ref 6.5–8.1)

## 2016-10-24 LAB — ABO/RH: ABO/RH(D): AB POS

## 2016-10-24 LAB — TYPE AND SCREEN
ABO/RH(D): AB POS
Antibody Screen: NEGATIVE

## 2016-10-24 LAB — LIPASE, BLOOD: Lipase: 27 U/L (ref 11–51)

## 2016-10-24 MED ORDER — SODIUM CHLORIDE 0.9% FLUSH: 10.0000 mL | INTRAVENOUS | Status: DC | PRN

## 2016-10-24 MED ORDER — HYDROCORTISONE ACETATE 25 MG RE SUPP: 25.0000 mg | Freq: Two times a day (BID) | RECTAL | 0 refills | Status: DC

## 2016-10-24 MED ORDER — HEPARIN SOD (PORK) LOCK FLUSH 100 UNIT/ML IV SOLN
500.0000 [IU] | INTRAVENOUS | Status: AC | PRN
  Administered 2016-10-24: 500 [IU]

## 2016-10-24 MED ORDER — IOPAMIDOL (ISOVUE-300) INJECTION 61%
INTRAVENOUS | Status: AC
  Administered 2016-10-24: 100 mL
  Filled 2016-10-24: qty 100

## 2016-10-24 MED ORDER — DOCUSATE SODIUM 100 MG PO CAPS: 100.0000 mg | ORAL_CAPSULE | Freq: Two times a day (BID) | ORAL | 0 refills | Status: AC

## 2016-10-24 MED ORDER — DICYCLOMINE HCL 20 MG PO TABS: 20.0000 mg | ORAL_TABLET | Freq: Two times a day (BID) | ORAL | 0 refills | Status: DC

## 2016-10-24 NOTE — Discharge Instructions (Signed)
Follow these instructions at home: Take over-the-counter and prescription medicines only as told by your health care provider. If you were prescribed an antibiotic medicine, take it as told by your health care provider. Do not stop taking the antibiotic even if you start to feel better. Try to avoid eating right before bedtime. Take sitz baths as told by your health care provider. Keep all follow-up visits as told by your health care provider. This is important. Contact a health care provider if: Your symptoms do not improve with treatment. Your symptoms get worse. You have a fever.

## 2016-10-24 NOTE — ED Notes (Signed)
Returned from ct scan 

## 2016-10-24 NOTE — ED Notes (Signed)
Patient transported to CT 

## 2016-10-24 NOTE — ED Provider Notes (Signed)
[place] DEPT Provider Note   CSN: [id number] Arrival date & time: 10/24/16  1007     History   Chief Complaint Chief Complaint  Patient presents with  . Rectal Bleeding  . Abdominal Pain    HPI [name] [name] is a 56 y.o. female who presents emergency Department with chief complaint of bleeding from her bottom. This began 5 days ago. She complains of pain across her lower abdomen. Patient states that on Saturday she passed gas on the toilet and then noticed blood clots from her bottom. She had bowel movement that was mixed with red blood. She's had multiple episodes of passing red blood from her bottom. She states this feels similar to a previous episode of hemorrhoids. She had, however, she did have a previous hemorrhoidectomy. Patient also has a history of ulcerative colitis and recurrent diverticulitis episodes. She complains of pain and distention across her abdomen. She's not had any fevers, vomiting or nausea. She denies urinary symptoms. She's had multiple abdominal surgeries.  HPI  Past Medical History:  Diagnosis Date  . Arthritis   . Depression   . Diverticulitis   . Hypertension   . Obesity   . Pneumonia   . Pulmonary embolism [facility])     Patient Active Problem List   Diagnosis Date Noted  . GAD (generalized anxiety disorder) 08/14/2016  . Morbid obesity ([place]) 08/14/2016  . Stroke ([place]) 06/27/2016  . Right sided weakness 06/27/2016  . HTN (hypertension) 06/27/2016  . Atypical chest pain 06/27/2016  . Difficult airway 03/29/2016  . 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 OSA on CPAP 02/20/2016  . Chronic pain syndrome 01/31/2016  . Vaginal bleeding 11/20/2015  . Hematochezia 11/20/2015  . Pulmonary embolus ([place]) 11/15/2015  . Vertigo 11/15/2015  . Depression 11/15/2015    Past Surgical History:  Procedure Laterality Date  . ABDOMINAL HYSTERECTOMY    . BREAST REDUCTION SURGERY    . CHOLECYSTECTOMY    . EMBOLECTOMY N/A      pulmonary embolectomy    OB History    No data available       Home Medications    Prior to Admission medications   Medication Sig Start Date End Date Taking? Authorizing Provider  acetaminophen-codeine (TYLENOL #3) 300-30 MG tablet Take 1 tablet by mouth every 8 (eight) hours as needed for moderate pain. Max total Tyelnol < 2000mg  /day 09/24/16  Yes Eileen Stanford, PA-C  albuterol (PROVENTIL HFA;VENTOLIN HFA) 108 (90 Base) MCG/ACT inhaler Inhale 1-2 puffs into the lungs every 6 (six) hours as needed for wheezing or shortness of breath. 02/08/16  Yes Maryan Puls, MD  amitriptyline (ELAVIL) 50 MG tablet Take 1 tablet (50 mg total) by mouth at bedtime. Try 1/2 tab first, if enough keep 1/2 dose. If not, than increase to full dose. Patient taking differently: Take 50 mg by mouth at bedtime.  10/01/16  Yes Maren Reamer, MD  amLODipine (NORVASC) 5 MG tablet Take 1 tablet (5 mg total) by mouth daily. 08/14/16  Yes Maren Reamer, MD  atorvastatin (LIPITOR) 20 MG tablet Take 1 tablet (20 mg total) by mouth daily at 6 PM. 08/14/16  Yes Maren Reamer, MD  diclofenac (FLECTOR) 1.3 % PTCH Place 1 patch onto the skin 2 (two) times daily. 10/16/16  Yes Jolaine Artist, MD  diclofenac sodium (VOLTAREN) 1 % GEL Apply 2 g topically 4 (four) times daily. 08/14/16  Yes Maren Reamer, MD  diphenhydramine-acetaminophen (TYLENOL PM) 25-500 MG TABS tablet  Take 1 tablet by mouth at bedtime as needed (sleep).   Yes Historical Provider, MD  docusate sodium (COLACE) 100 MG capsule Take 100 mg by mouth 2 (two) times daily.    Yes Historical Provider, MD  DULoxetine (CYMBALTA) 60 MG capsule TAKE ONE CAPSULE BY MOUTH ONCE DAILY 09/30/16  Yes Ankit Lorie Phenix, MD  furosemide (LASIX) 40 MG tablet Take 1 tablet (40 mg total) by mouth daily. 09/10/16  Yes Maren Reamer, MD  gabapentin (NEURONTIN) 300 MG capsule Take 1 capsule (300 mg total) by mouth 3 (three) times daily. 07/18/16 10/16/17 Yes Ankit Lorie Phenix,  MD  guaiFENesin (MUCINEX) 600 MG 12 hr tablet Take 1 tablet (600 mg total) by mouth 2 (two) times daily. 10/01/16  Yes Maren Reamer, MD  loperamide (IMODIUM A-D) 2 MG tablet Take 1 tablet (2 mg total) by mouth 4 (four) times daily as needed for diarrhea or loose stools. 07/16/16  Yes Maren Reamer, MD  meclizine (ANTIVERT) 25 MG tablet Take 25 mg by mouth 3 (three) times daily as needed for dizziness. 09/30/16  Yes Historical Provider, MD  metoprolol tartrate (LOPRESSOR) 25 MG tablet Take 25 mg by mouth daily.   Yes Historical Provider, MD  potassium chloride (K-DUR) 10 MEQ tablet Take 1 tablet (10 mEq total) by mouth 2 (two) times daily. 07/30/16  Yes Maren Reamer, MD  rivaroxaban (XARELTO) 20 MG TABS tablet Take 1 tablet (20 mg total) by mouth daily with supper. 08/14/16  Yes Maren Reamer, MD  sodium chloride (OCEAN) 0.65 % SOLN nasal spray Place 1 spray into both nostrils as needed for congestion. 10/14/16  Yes Maren Reamer, MD  triamcinolone cream (KENALOG) 0.1 % Apply 1 application topically 2 (two) times daily. For rash on left leg Patient taking differently: Apply 1 application topically 2 (two) times daily as needed (for rash.).  05/15/16  Yes Daleen Bo, MD  zaleplon (SONATA) 5 MG capsule Take 1 capsule (5 mg total) by mouth at bedtime as needed for sleep. 04/30/16  Yes Maren Reamer, MD    Family History Family History  Problem Relation Age of Onset  . Lung cancer Maternal Grandmother     Social History Social History  Substance Use Topics  . Smoking status: Never Smoker  . Smokeless tobacco: Never Used  . Alcohol use No     Allergies   [name].   Review of Systems Review of Systems Ten systems reviewed and are negative for acute change, except as noted in the HPI.    Physical Exam Updated Vital Signs BP 129/73 (BP Location: Left Arm)   Pulse 82   Temp 98.2 F (36.8 C) (Oral)   Resp 16   Ht 5\' 5"  (1.651 m)   Wt (!) 142.4 kg    SpO2 97%   BMI 52.25 kg/m   Physical Exam  Constitutional: She is oriented to person, place, and time. She appears well-developed and well-nourished. No distress.  HENT:  Head: Normocephalic and atraumatic.  Eyes: Conjunctivae are normal. No scleral icterus.  Neck: Normal range of motion.  Cardiovascular: Normal rate, regular rhythm and normal heart sounds.  Exam reveals no gallop and no friction rub.   No murmur heard. Pulmonary/Chest: Effort normal and breath sounds normal. No respiratory distress.  Abdominal: Soft. Bowel sounds are normal. She exhibits distension. She exhibits no mass. There is tenderness. There is no guarding.    Genitourinary:  Genitourinary Comments: Normal rectal tone, nonthrombosed  external hemorrhoid present. Exquisitely tender to palpation. No masses or fissures palpated. Stool is brown and bloody.   Neurological: She is alert and oriented to person, place, and time.  Skin: Skin is warm and dry. She is not diaphoretic.  Nursing note and vitals reviewed.    ED Treatments / Results  Labs (all labs ordered are listed, but only abnormal results are displayed) Labs Reviewed  COMPREHENSIVE METABOLIC PANEL - Abnormal; Notable for the following:       Result Value   Creatinine, Ser 1.04 (*)    Total Protein 6.3 (*)    Albumin 3.3 (*)    ALT 9 (*)    GFR calc non Af Amer 60 (*)    All other components within normal limits  CBC - Abnormal; Notable for the following:    RBC 5.48 (*)    Hemoglobin 11.5 (*)    HCT 35.8 (*)    MCV 65.3 (*)    MCH 21.0 (*)    RDW 19.6 (*)    All other components within normal limits  LIPASE, BLOOD  OCCULT BLOOD X 1 CARD TO LAB, STOOL  POC OCCULT BLOOD, ED  TYPE AND SCREEN  ABO/RH    EKG  EKG Interpretation None       Radiology No results found.  Procedures Procedures (including critical care time)  Medications Ordered in ED Medications - No data to display   Initial Impression / Assessment and Plan /  ED Course  I have reviewed the triage vital signs and the nursing notes.  Pertinent labs & imaging results that were available during my care of the patient were reviewed by me and considered in my medical decision making (see chart for details).     Patient CT scan shows proctitis versus inflamed hemorrhoids. Her hemoglobin is stable. Patient will be given hydrocortisone suppositories for treatment of her symptoms. Patient will be given follow up with gastroenterology. She'll be safe for discharge at this time  Final Clinical Impressions(s) / ED Diagnoses   Final diagnoses:  Proctitis    New Prescriptions New Prescriptions   No medications on file     Margarita Mail, PA-C 10/24/16 [place], MD 10/24/16 1649

## 2016-10-24 NOTE — ED Triage Notes (Signed)
Pt from home with c/o rectal bleeding and generalized sharp abdominal pain since Sat.  Pt reports bright red blood in the toilet, even with gas and not BM.  Pt hx of hemorrhoid surgery.  NAD, A&O.

## 2016-10-25 ENCOUNTER — Other Ambulatory Visit: Payer: Self-pay

## 2016-10-25 NOTE — Patient Outreach (Signed)
[facility]) Care Management  10/25/16  [name] [name] Aug 16, 1961 [id number]  RNCM received inbound call from patient. Patient stated that she wanted to call and updated RNCM that she did go to the emergency department on 10/24/16  for the rectal bleeding that she was experiencing and was diagnosed with proctitis. She stated that they originally thought they would have to give her blood when she got there, but her numbers were good so she did not have to get a transfusion.   She stated that she was prescribed bentyl and hydrocortisone suppositories to help with pain. She stated she was told to take a stool softener, but she did not get this because she is already taking one.   Patient stated that she has a history of ulcerative colitis and that they believe this may be what caused her proctitis.   She stated that they gave her information for a gastroenterologist and she has to follow up with them regarding her GI issues. She stated that she would likely need to have a colonoscopy and an upper GI.   Patient stated that otherwise, she is feeling a little better today. She reported that the bleeding is better and the pain is getting better.   She is still concerned about her cardiac catheterization on Monday. RNCM encouraged patient to discuss this with Dr. Haroldine Laws and to be sure he is aware before performing the procedure. RNCM encouraged to updated him with continued bleeding or to let him know when it stopped (if it stops prior to Monday). Patient verbalized understanding.  Patient currently has no other questions or concerns. RNCM encouraged to call with any needs. Will follow within the next couple of weeks and patient is agreeable.  [name] Levone Otten, RN, BSN, [place] Management Coordinator [phone]

## 2016-10-28 ENCOUNTER — Encounter (HOSPITAL_COMMUNITY)
Admission: RE | Disposition: A | Discharge status: Home / Self Care | Payer: Self-pay | Source: Ambulatory Visit | Attending: Internal Medicine

## 2016-10-28 ENCOUNTER — Ambulatory Visit (HOSPITAL_COMMUNITY)
Admission: RE | Admit: 2016-10-28 | Discharge: 2016-10-28 | Disposition: A | Discharge status: Home / Self Care | Payer: Medicare HMO | Source: Ambulatory Visit | Attending: Internal Medicine | Admitting: Internal Medicine

## 2016-10-28 ENCOUNTER — Encounter (HOSPITAL_COMMUNITY): Payer: Self-pay | Admitting: Internal Medicine

## 2016-10-28 DIAGNOSIS — M1990 Unspecified osteoarthritis, unspecified site: Secondary | ICD-10-CM | POA: Diagnosis not present

## 2016-10-28 DIAGNOSIS — I2721 Secondary pulmonary arterial hypertension: Secondary | ICD-10-CM | POA: Diagnosis not present

## 2016-10-28 DIAGNOSIS — M069 Rheumatoid arthritis, unspecified: Secondary | ICD-10-CM | POA: Insufficient documentation

## 2016-10-28 DIAGNOSIS — G894 Chronic pain syndrome: Secondary | ICD-10-CM | POA: Diagnosis not present

## 2016-10-28 DIAGNOSIS — Z7901 Long term (current) use of anticoagulants: Secondary | ICD-10-CM | POA: Diagnosis not present

## 2016-10-28 DIAGNOSIS — M797 Fibromyalgia: Secondary | ICD-10-CM | POA: Insufficient documentation

## 2016-10-28 DIAGNOSIS — Z86711 Personal history of pulmonary embolism: Secondary | ICD-10-CM | POA: Insufficient documentation

## 2016-10-28 DIAGNOSIS — F329 Major depressive disorder, single episode, unspecified: Secondary | ICD-10-CM | POA: Diagnosis not present

## 2016-10-28 DIAGNOSIS — R0602 Shortness of breath: Secondary | ICD-10-CM

## 2016-10-28 DIAGNOSIS — I10 Essential (primary) hypertension: Secondary | ICD-10-CM | POA: Insufficient documentation

## 2016-10-28 DIAGNOSIS — F419 Anxiety disorder, unspecified: Secondary | ICD-10-CM | POA: Diagnosis not present

## 2016-10-28 DIAGNOSIS — I4510 Unspecified right bundle-branch block: Secondary | ICD-10-CM | POA: Diagnosis not present

## 2016-10-28 DIAGNOSIS — G4733 Obstructive sleep apnea (adult) (pediatric): Secondary | ICD-10-CM | POA: Diagnosis not present

## 2016-10-28 DIAGNOSIS — I272 Pulmonary hypertension, unspecified: Secondary | ICD-10-CM

## 2016-10-28 DIAGNOSIS — R079 Chest pain, unspecified: Secondary | ICD-10-CM | POA: Insufficient documentation

## 2016-10-28 DIAGNOSIS — Z6841 Body Mass Index (BMI) 40.0 and over, adult: Secondary | ICD-10-CM | POA: Insufficient documentation

## 2016-10-28 HISTORY — PX: RIGHT HEART CATH: CATH118263

## 2016-10-28 LAB — POCT I-STAT 3, VENOUS BLOOD GAS (G3P V)
Acid-Base Excess: 2 mmol/L (ref 0.0–2.0)
Acid-Base Excess: 2 mmol/L (ref 0.0–2.0)
Acid-Base Excess: 6 mmol/L — ABNORMAL HIGH (ref 0.0–2.0)
Bicarbonate: 27.9 mmol/L (ref 20.0–28.0)
Bicarbonate: 28.4 mmol/L — ABNORMAL HIGH (ref 20.0–28.0)
Bicarbonate: 32.6 mmol/L — ABNORMAL HIGH (ref 20.0–28.0)
O2 Saturation: 68 %
O2 Saturation: 70 %
O2 Saturation: 71 %
TCO2: 29 mmol/L (ref 0–100)
TCO2: 30 mmol/L (ref 0–100)
TCO2: 34 mmol/L (ref 0–100)
pCO2, Ven: 49.5 mmHg (ref 44.0–60.0)
pCO2, Ven: 51.5 mmHg (ref 44.0–60.0)
pCO2, Ven: 54.4 mmHg (ref 44.0–60.0)
pH, Ven: 7.35 (ref 7.250–7.430)
pH, Ven: 7.359 (ref 7.250–7.430)
pH, Ven: 7.386 (ref 7.250–7.430)
pO2, Ven: 37 mmHg (ref 32.0–45.0)
pO2, Ven: 38 mmHg (ref 32.0–45.0)
pO2, Ven: 40 mmHg (ref 32.0–45.0)

## 2016-10-28 SURGERY — RIGHT HEART CATH
Anesthesia: LOCAL

## 2016-10-28 MED ORDER — FENTANYL CITRATE (PF) 100 MCG/2ML IJ SOLN
INTRAMUSCULAR | Status: DC | PRN
  Administered 2016-10-28: 25 ug via INTRAVENOUS

## 2016-10-28 MED ORDER — LIDOCAINE HCL (PF) 1 % IJ SOLN
INTRAMUSCULAR | Status: DC | PRN
  Administered 2016-10-28: 2 mL

## 2016-10-28 MED ORDER — SODIUM CHLORIDE 0.9 % IV SOLN: 250.0000 mL | INTRAVENOUS | Status: DC | PRN

## 2016-10-28 MED ORDER — SODIUM CHLORIDE 0.9% FLUSH: 3.0000 mL | Freq: Two times a day (BID) | INTRAVENOUS | Status: DC

## 2016-10-28 MED ORDER — SODIUM CHLORIDE 0.9% FLUSH: 3.0000 mL | INTRAVENOUS | Status: DC | PRN

## 2016-10-28 MED ORDER — MIDAZOLAM HCL 2 MG/2ML IJ SOLN
INTRAMUSCULAR | Status: AC
  Filled 2016-10-28: qty 2

## 2016-10-28 MED ORDER — HEPARIN (PORCINE) IN NACL 2-0.9 UNIT/ML-% IJ SOLN
INTRAMUSCULAR | Status: DC | PRN
  Administered 2016-10-28: 1500 mL

## 2016-10-28 MED ORDER — HEPARIN SOD (PORK) LOCK FLUSH 100 UNIT/ML IV SOLN: 500.0000 [IU] | Freq: Once | INTRAVENOUS | Status: DC

## 2016-10-28 MED ORDER — HEPARIN (PORCINE) IN NACL 2-0.9 UNIT/ML-% IJ SOLN
INTRAMUSCULAR | Status: AC
  Filled 2016-10-28: qty 500

## 2016-10-28 MED ORDER — ONDANSETRON HCL 4 MG/2ML IJ SOLN: 4.0000 mg | Freq: Four times a day (QID) | INTRAMUSCULAR | Status: DC | PRN

## 2016-10-28 MED ORDER — SODIUM CHLORIDE 0.9 % IV SOLN
INTRAVENOUS | Status: DC
Start: 2016-10-29 — End: 2016-10-28
  Administered 2016-10-28: 09:00:00 via INTRAVENOUS

## 2016-10-28 MED ORDER — SODIUM CHLORIDE 0.9% FLUSH: 10.0000 mL | INTRAVENOUS | Status: DC

## 2016-10-28 MED ORDER — HEPARIN SOD (PORK) LOCK FLUSH 100 UNIT/ML IV SOLN
500.0000 [IU] | Freq: Once | INTRAVENOUS | Status: AC
  Administered 2016-10-28: 500 [IU] via INTRAVENOUS

## 2016-10-28 MED ORDER — ACETAMINOPHEN 325 MG PO TABS: 650.0000 mg | ORAL_TABLET | ORAL | Status: DC | PRN

## 2016-10-28 MED ORDER — MIDAZOLAM HCL 2 MG/2ML IJ SOLN
INTRAMUSCULAR | Status: DC | PRN
  Administered 2016-10-28 (×2): 1 mg via INTRAVENOUS

## 2016-10-28 MED ORDER — LIDOCAINE HCL (PF) 1 % IJ SOLN
INTRAMUSCULAR | Status: AC
  Filled 2016-10-28: qty 30

## 2016-10-28 MED ORDER — FENTANYL CITRATE (PF) 100 MCG/2ML IJ SOLN
INTRAMUSCULAR | Status: AC
  Filled 2016-10-28: qty 2

## 2016-10-28 SURGICAL SUPPLY — 14 items
CATH BALLN WEDGE 5F 110CM (CATHETERS) ×2 IMPLANT
CATH SWAN GANZ 7F STRAIGHT (CATHETERS) IMPLANT
GUIDEWIRE .025 260CM (WIRE) ×2 IMPLANT
GUIDEWIRE ANGLED .035X260CM (WIRE) IMPLANT
KIT HEART LEFT (KITS) ×2 IMPLANT
PACK CARDIAC CATHETERIZATION (CUSTOM PROCEDURE TRAY) ×2 IMPLANT
PINNACLE LONG 5F 25CM (SHEATH) ×2
SHEATH FAST CATH BRACH 5F 5CM (SHEATH) ×2 IMPLANT
SHEATH INTROD PINNACLE 5F 25CM (SHEATH) ×1 IMPLANT
SHEATH PINNACLE 7F 10CM (SHEATH) IMPLANT
TRANSDUCER W/STOPCOCK (MISCELLANEOUS) ×2 IMPLANT
TUBING ART PRESS 72  MALE/FEM (TUBING) ×1
TUBING ART PRESS 72 MALE/FEM (TUBING) ×1 IMPLANT
WIRE MAILMAN 182CM (WIRE) ×2 IMPLANT

## 2016-10-28 NOTE — H&P (View-Only) (Signed)
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 These symptoms progressively worsened over several days prompting her to go to [facility] in her then-hometown of [place] in 09/2015 where she was diagnosed with PE and started on anticoagulation. She was very briefly on warfarin but quickly switched to [place] Found was referred to [facility]iology in 12/2015. Patient refused [facility] because of financial fears and unclear insurance status. Echocardiogram (12-2015) showed normal LVEF 55-60%, normal LA, normal RV size, RVSP estimated as 70 mm Hg. She further was referred to [facility] and saw [name] [name] on 03/14/2016 where patient had several studies including V/Q scan which found high prob for PE; studies c/w CTEPH. It was felt that her chronic chest pain was caused by chronic PE. RHC/LHC/Pulm Angiogram 03/2016 confirmed diagnosis of CTEPH. She underwent pulmonary thromboembolectomy 03/31/2016 at [facility] with IVC filter placement. Pre-op cath with no CAD.   Seen by [name] [name] in 04/2016 for follow up. He felt like her heart size was decreasing. Plan was for lifelong [place] and ECHO and V/Q in 4 months. [name] [name] has since left the [place] practice.  CT angio at [facility] on 05/15/16 showed " persistent but partially recanalized PE within right lower lobe pulmonary artery. No new PE identified. "  She was admitted to [facility] in 06/2016 for continued chest pain and parasthesias. V/Q scan on 06/27/16 showed prominent ventilation  perfusion mismatch in right c/w PE. Head CT negative. She was continued on Xarelto and told to follow up with [facility]. Patient doesn't want to have to travel to [facility] for care so [place] care here in [place].   Saw [name] [name] recently for ongoing CP. Referred here for further management of CP and PAH. Echo 10/07/16. EF 55%. RV normal RVSP 46mmHG  Says her SOB is coming back. She has gained some weight back. Main complaint is chest pain. Says it happens all day long. Comes and goes all day. Sharp, shooting pains. Worse with deep breathing. Doesn't get worse with walking. Occasional edema. Trying to get supplies for CPAP.    Past Medical History:  Diagnosis Date  . Arthritis   . Depression   . Diverticulitis   . Hypertension   . Obesity   . Pneumonia   . Pulmonary embolism [facility])     Past Surgical History:  Procedure Laterality Date  . ABDOMINAL HYSTERECTOMY    . BREAST REDUCTION SURGERY    . CHOLECYSTECTOMY    . EMBOLECTOMY N/A    pulmonary embolectomy    Current Medications:  (Not in an outpatient encounter)   Allergies:   [name].   Social History   Social History  . Marital status: Married    Spouse name: N/A  . Number of children: N/A  . Years of education: N/A   Social History Main Topics  . Smoking status: Never Smoker  . Smokeless tobacco: Never Used  . Alcohol use No  . Drug use:  No  . Sexual activity: Not on file   Other Topics Concern  . Not on file   Social History Narrative  . No narrative on file     Family History:  The patient's family history includes Lung cancer in her maternal grandmother.      ROS:   Please see the history of present illness.    ROS All other systems reviewed and are negative.   PHYSICAL EXAM:   VS:  BP (!) 148/72   Pulse 100   Wt (!) 320 lb 8 oz (145.4 kg)   SpO2 98%   BMI 53.33 kg/m    GEN: Well nourished, well developed, in no acute distress, morbidly obese.  HEENT: normal    Neck: neck thik. No obvious JVD, carotid bruits, or masses Cardiac: RRR; no murmurs, rubs, or gallops,no edema  Respiratory:  clear to auscultation bilaterally, normal work of breathing GI: obese soft, nontender, nondistended, + BS MS: no deformity or atrophy  Skin: warm and dry, no rash Neuro:  Alert and Oriented x 3, Strength and sensation are intact Psych: euthymic mood, full affect   Wt Readings from Last 3 Encounters:  10/01/16 (!) 318 lb 6.4 oz (144.4 kg)  09/24/16 (!) 314 lb 9.6 oz (142.7 kg)  09/24/16 (!) 314 lb 12.8 oz (142.8 kg)      Studies/Labs Reviewed:   EKG:  EKG is ordered today.  The ekg ordered today demonstrates NSR, RBBB, HR 77   Recent Labs: 03/20/2016: B Natriuretic Peptide 88.3 06/26/2016: BUN 12; Creatinine, Ser 0.88; Hemoglobin 11.5; Platelets 335; Potassium 3.9; Sodium 140 06/27/2016: ALT 9   Lipid Panel    Component Value Date/Time   CHOL 166 06/27/2016 0346   TRIG 125 06/27/2016 0346   HDL 33 (L) 06/27/2016 0346   CHOLHDL 5.0 06/27/2016 0346   VLDL 25 06/27/2016 0346   LDLCALC 108 (H) 06/27/2016 0346    Additional studies/ records that were reviewed today include:   2D ECHO: 12/15/2015 LV EF: 55% -   60% Study Conclusions - Left ventricle: The cavity size was normal. Wall thickness was normal. Systolic function was normal. The estimated ejection  fraction was in the [name] of 55% to 60%. - Pulmonary arteries: PA peak pressure: 70 mm Hg (S). - Pericardium, extracardiac: A trivial pericardial effusion was   identified.   2D ECHO 04/05/16 INTERPRETATION:NORMAL LEFT VENTRICULAR FUNCTION WITH MILD LVHSEVERE RV SYSTOLIC DYSFUNCTION (See above) VALVULAR REGURGITATION: TRIVIAL PR, MILD TR NO VALVULAR STENOSIS TRIVIAL PERICARDIAL EFFUSION Compared with prior Echo study on 03/14/2016: RVSP increased from 50 to 69   CATH right heart and coronary angiography: 03/25/2016 [place] Component Name Value Ref [name]  Cardiac  Index (l/min/m2) 2.3 L/min/m2  Right Atrium Mean Pressure (mmHg) 11 mmHg   Right Ventricle Systolic Pressure (mmHg) 68 mmHg   Pulmonary Artery Mean Pressure (mmHg) 40 mmHg   Pulmonary Wedge Pressure (mmHg) 14 mmHg   Pulmonary Vascular Resistance ([place] units) 4.7    CT angio 05/15/16 IMPRESSION: Persistent but partially recanalized pulmonary embolus within the right lower lobe pulmonary artery. No new pulmonary embolism is identified. Minimal bibasilar atelectatic changes stable from the prior exam.   V/Q scan 06/27/16 IMPRESSION: Prominent ventilation perfusion mismatch noted on the right. Finding suggests high probability right-sided pulmonary embolus in this patient with known right-sided pulmonary embolus.   ASSESSMENT & PLAN:   1. Chronic chest pain:  --LHC performed at [facility] 7/17 - no CAD --per [facility] notes in [place], felt  to be from her chronic PEs and pulmonary HTN.  --I suspect this is more musculoskeletal/arthritic will start with topical NSAID (diclofenac). If that fails can try topical lidoderm patch. Discussed with PharmD   2. 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 Can stop ASA --Echo with RVPS 57mmHG. Will proceed with RHC to confirm pulmonary pressures and see if need for any adjuvant PAH therapy  3. HTN:  --BP mildly elevated. Will follow  4. Fibromyalgia/Rheumatoid Arthitis:  - Recently diagnosed. Sees rheumatology 12/2016 to see if any autoimmune process is contributing to her chronic pain.  - Will start topical NSAIDs for presumed arthritic pain in chest  5. Morbid obesity with OHS:  - encouraged to lose weight. Gave her info on bariatrics program   6. OSA - stressed need to get supplies to restart CPAP  Lain Tetterton,MD 3:48 PM

## 2016-10-28 NOTE — Discharge Instructions (Signed)
Venogram, Care After °Refer to this sheet in the next few weeks. These instructions provide you with information on caring for yourself after your procedure. Your health care provider may also give you more specific instructions. Your treatment has been planned according to current medical practices, but problems sometimes occur. Call your health care provider if you have any problems or questions after your procedure. °WHAT TO EXPECT AFTER THE PROCEDURE °After your procedure, it is typical to have the following sensations: °· Mild discomfort at the catheter insertion site. °HOME CARE INSTRUCTIONS  °· Take all medicines exactly as directed. °· Follow any prescribed diet. °· Follow instructions regarding both rest and physical activity. °· Drink more fluids for the first several days after the procedure in order to help flush dye from your kidneys. °SEEK MEDICAL CARE IF: °· You develop a rash. °· You have fever not controlled by medicine. °SEEK IMMEDIATE MEDICAL CARE IF: °· There is pain, drainage, bleeding, redness, swelling, warmth or a red streak at the site of the IV tube. °· The extremity where your IV tube was placed becomes discolored, numb, or cool. °· You have difficulty breathing or shortness of breath. °· You develop chest pain. °· You have excessive dizziness or fainting. °This information is not intended to replace advice given to you by your health care provider. Make sure you discuss any questions you have with your health care provider. °Document Released: 06/16/2013 Document Revised: 08/31/2013 Document Reviewed: 06/16/2013 °Elsevier Interactive Patient Education © 2017 [employer] ° °

## 2016-10-28 NOTE — Interval H&P Note (Signed)
History and Physical Interval Note:  10/28/2016 9:53 AM  [name] [name]  has presented today for surgery, with the diagnosis of PAH The various methods of treatment have been discussed with the patient and family. After consideration of risks, benefits and other options for treatment, the patient has consented to  Procedure(s): Right Heart Cath (N/A) as a surgical intervention .  The patient's history has been reviewed, patient examined, no change in status, stable for surgery.  I have reviewed the patient's chart and labs.  Questions were answered to the patient's satisfaction.     Mariame Rybolt, Quillian Quince

## 2016-11-04 ENCOUNTER — Other Ambulatory Visit (HOSPITAL_COMMUNITY): Payer: Self-pay | Admitting: Pharmacist

## 2016-11-04 MED ORDER — RIOCIGUAT 1 MG PO TABS: 1.0000 mg | ORAL_TABLET | Freq: Three times a day (TID) | ORAL | 11 refills | Status: DC

## 2016-11-05 ENCOUNTER — Telehealth (HOSPITAL_COMMUNITY): Payer: Self-pay | Admitting: Pharmacist

## 2016-11-05 NOTE — Telephone Encounter (Signed)
Adempas 0.5, 1, 1.5, 2, 2.5 mg tablets approved by [employer] Part D through 11/04/18.   Ruta Hinds. Velva Harman, PharmD, BCPS, CPP Clinical Pharmacist Pager: [phone] Phone: [phone] 11/05/2016 9:59 AM

## 2016-11-07 ENCOUNTER — Ambulatory Visit (INDEPENDENT_AMBULATORY_CARE_PROVIDER_SITE_OTHER): Payer: Medicare HMO | Admitting: Psychiatry

## 2016-11-07 ENCOUNTER — Encounter (HOSPITAL_COMMUNITY): Payer: Self-pay | Admitting: Psychiatry

## 2016-11-07 VITALS — BP 110/76 | HR 69 | Ht 65.0 in | Wt 320.0 lb

## 2016-11-07 DIAGNOSIS — Z818 Family history of other mental and behavioral disorders: Secondary | ICD-10-CM

## 2016-11-07 DIAGNOSIS — Z79899 Other long term (current) drug therapy: Secondary | ICD-10-CM | POA: Diagnosis not present

## 2016-11-07 DIAGNOSIS — F330 Major depressive disorder, recurrent, mild: Secondary | ICD-10-CM

## 2016-11-07 DIAGNOSIS — F419 Anxiety disorder, unspecified: Secondary | ICD-10-CM | POA: Diagnosis not present

## 2016-11-07 DIAGNOSIS — Z810 Family history of intellectual disabilities: Secondary | ICD-10-CM | POA: Diagnosis not present

## 2016-11-07 DIAGNOSIS — Z813 Family history of other psychoactive substance abuse and dependence: Secondary | ICD-10-CM

## 2016-11-07 MED ORDER — TRAZODONE HCL 50 MG PO TABS: 50.0000 mg | ORAL_TABLET | Freq: Every evening | ORAL | 0 refills | Status: DC | PRN

## 2016-11-07 MED ORDER — BUPROPION HCL ER (XL) 150 MG PO TB24: 150.0000 mg | ORAL_TABLET | ORAL | 0 refills | Status: DC

## 2016-11-07 NOTE — Progress Notes (Signed)
Psychiatric Initial Adult Assessment   Patient Identification: [name] [name] MRN:  [id number] Date of Evaluation:  11/07/2016 Referral Source: Primary care physician Chief Complaint:   Chief Complaint    Establish Care     Visit Diagnosis:    ICD-9-CM ICD-10-CM   1. Mild episode of recurrent major depressive disorder ([place]) 296.31 F33.0 buPROPion (WELLBUTRIN XL) 150 MG 24 hr tablet     traZODone (DESYREL) 50 MG tablet    History of Present Illness:  Patient is 56 year old African-American married female who is referred from her primary care physician for treatment of depression and anxiety symptoms.  She endorse history of depression and anxiety for many years but recently symptoms started to get worse in past one year.  Patient moved from [facility] to this area because she was not getting enough physical health there.  She has hypertension, obesity, sleep apnea, history of pulmonary embolism, arthritis, back pain and last year she has thoracic surgery.  She has noticed since then she has lack of motivation, lack of interest, increased anxiety, low energy, poor sleep, racing thoughts and nervousness.  She worried about her future.  She is on disability.  Her physician recently started her on Cymbalta to help pain and depression but she has not seen a huge improvement.  She lives with her husband who also had multiple health issues.  Currently she is taking amitriptyline, Neurontin and Cymbalta but is still feel depressed sad and isolated.  She does not leave her house unless it is important.  She is raising her nephew who she had legal custody.  Patient denies any psychosis, mania, hallucination, suicidal thoughts or homicidal thought.  She like to try a different antidepressant.  [place] she is not seeing therapist but open to see a counselor in this office.  Patient denies drinking alcohol or using any illegal substances.  Associated Signs/Symptoms: Depression Symptoms:  depressed  mood, insomnia, fatigue, feelings of worthlessness/guilt, difficulty concentrating, hopelessness, anxiety, disturbed sleep, (Hypo) Manic Symptoms:  Irritable Mood, Anxiety Symptoms:  Excessive Worry, Psychotic Symptoms:  Patient denies any psychotic symptoms PTSD Symptoms: Negative  Past Psychiatric History: Patient remember her depression and started after she lost her 64-day-old baby 22 years ago.  She took medicine for little file and then she did very well without medication until 10 years later she started again feeling depressed and sad.  She was given Prozac, Zoloft when she was living in [place].  She denies any psychiatric condition treatment, suicidal attempt, psychosis, hallucination, paranoia or any self abusive behavior.  Recently her primary care physician tried her on hydroxyzine, Celexa and Cymbalta.  She was also given amitriptyline and Neurontin.  Previous Psychotropic Medications: Yes   Substance Abuse History in the last 12 months:  No.  Consequences of Substance Abuse: Negative  Past Medical History:  Past Medical History:  Diagnosis Date  . Arthritis   . Depression   . Diverticulitis   . Hypertension   . Obesity   . Pneumonia   . Pulmonary embolism [facility])     Past Surgical History:  Procedure Laterality Date  . ABDOMINAL HYSTERECTOMY    . BREAST REDUCTION SURGERY    . CHOLECYSTECTOMY    . EMBOLECTOMY N/A    pulmonary embolectomy  . RIGHT HEART CATH N/A 10/28/2016   Procedure: Right Heart Cath;  Surgeon: Jolaine Artist, MD;  Location: [facility];  Service: Cardiovascular;  Laterality: N/A;    Family Psychiatric History: Reviewed.  Family History:  Family  History  Problem Relation Age of Onset  . Lung cancer Maternal Grandmother   . Dementia Mother   . Dementia Father   . Anxiety disorder Sister   . Bipolar disorder Sister   . Drug abuse Sister   . Sexual abuse Maternal Aunt   . Anxiety disorder Cousin   . Anxiety disorder  Sister   . Bipolar disorder Sister   . Drug abuse Sister     Social History:   Social History   Social History  . Marital status: Married    Spouse name: N/A  . Number of children: N/A  . Years of education: N/A   Social History Main Topics  . Smoking status: Never Smoker  . Smokeless tobacco: Never Used  . Alcohol use No  . Drug use: No  . Sexual activity: Yes    Partners: Male    Birth control/ protection: Surgical   Other Topics Concern  . None   Social History Narrative  . None    Additional Social History: Patient born in [place].  She has children from her previous relationship.  She's been married to her current husband for more than 25 years.  Together she had a baby who did not survive more than 3 day.  Currently she lives with her husband who has multiple health issues, 22-year-old nephew who she had legal custody.  Patient has grown up son who lives in [place].  Allergies:  No Known Allergies  Metabolic Disorder Labs: Lab Results  Component Value Date   HGBA1C 5.6 06/27/2016   MPG 114 06/27/2016   No results found for: PROLACTIN Lab Results  Component Value Date   CHOL 166 06/27/2016   TRIG 125 06/27/2016   HDL 33 (L) 06/27/2016   CHOLHDL 5.0 06/27/2016   VLDL 25 06/27/2016   LDLCALC 108 (H) 06/27/2016     Current Medications: Current Outpatient Prescriptions  Medication Sig Dispense Refill  . acetaminophen-codeine (TYLENOL #3) 300-30 MG tablet Take 1 tablet by mouth every 8 (eight) hours as needed for moderate pain. Max total Tyelnol < 2000mg  /day 50 tablet 0  . albuterol (PROVENTIL HFA;VENTOLIN HFA) 108 (90 Base) MCG/ACT inhaler Inhale 1-2 puffs into the lungs every 6 (six) hours as needed for wheezing or shortness of breath. 6.7 g 5  . amLODipine (NORVASC) 5 MG tablet Take 1 tablet (5 mg total) by mouth daily. 90 tablet 3  . atorvastatin (LIPITOR) 20 MG tablet Take 1 tablet (20 mg total) by mouth daily at 6 PM. 90 tablet 2  . diclofenac  (FLECTOR) 1.3 % PTCH Place 1 patch onto the skin 2 (two) times daily. 60 patch 6  . diclofenac sodium (VOLTAREN) 1 % GEL Apply 2 g topically 4 (four) times daily. 100 g 2  . dicyclomine (BENTYL) 20 MG tablet Take 1 tablet (20 mg total) by mouth 2 (two) times daily. 20 tablet 0  . docusate sodium (COLACE) 100 MG capsule Take 1 capsule (100 mg total) by mouth every 12 (twelve) hours. 30 capsule 0  . DULoxetine (CYMBALTA) 60 MG capsule TAKE ONE CAPSULE BY MOUTH ONCE DAILY 60 capsule 0  . furosemide (LASIX) 40 MG tablet Take 1 tablet (40 mg total) by mouth daily. 30 tablet 2  . gabapentin (NEURONTIN) 300 MG capsule Take 1 capsule (300 mg total) by mouth 3 (three) times daily. 90 capsule 1  . guaiFENesin (MUCINEX) 600 MG 12 hr tablet Take 1 tablet (600 mg total) by mouth 2 (two) times daily. 30 tablet  0  . hydrocortisone (ANUSOL-HC) 25 MG suppository Place 1 suppository (25 mg total) rectally 2 (two) times daily. 24 suppository 0  . loperamide (IMODIUM A-D) 2 MG tablet Take 1 tablet (2 mg total) by mouth 4 (four) times daily as needed for diarrhea or loose stools. 30 tablet 0  . meclizine (ANTIVERT) 25 MG tablet Take 25 mg by mouth 3 (three) times daily as needed for dizziness.    . metoprolol tartrate (LOPRESSOR) 25 MG tablet Take 25 mg by mouth daily.    . potassium chloride (K-DUR) 10 MEQ tablet Take 1 tablet (10 mEq total) by mouth 2 (two) times daily. 30 tablet 2  . Riociguat (ADEMPAS) 1 MG TABS Take 1 mg by mouth 3 (three) times daily. 90 tablet 11  . rivaroxaban (XARELTO) 20 MG TABS tablet Take 1 tablet (20 mg total) by mouth daily with supper. 90 tablet 3  . sodium chloride (OCEAN) 0.65 % SOLN nasal spray Place 1 spray into both nostrils as needed for congestion. 1 Bottle 11  . triamcinolone cream (KENALOG) 0.1 % Apply 1 application topically 2 (two) times daily. For rash on left leg (Patient taking differently: Apply 1 application topically 2 (two) times daily as needed (for rash.). ) 30 g 0  .  buPROPion (WELLBUTRIN XL) 150 MG 24 hr tablet Take 1 tablet (150 mg total) by mouth every morning. 30 tablet 0  . traZODone (DESYREL) 50 MG tablet Take 1 tablet (50 mg total) by mouth at bedtime as needed for sleep. 30 tablet 0   No current facility-administered medications for this visit.     Neurologic: Headache: No Seizure: No Paresthesias:No  Musculoskeletal: Strength & Muscle Tone: decreased Gait & Station: unsteady Patient leans: Left and Front  Psychiatric Specialty Exam: ROS  Blood pressure 110/76, pulse 69, height 5\' 5"  (1.651 m), weight (!) 320 lb (145.2 kg), SpO2 95 %.Body mass index is 53.25 kg/m.  General Appearance: Casual and Tired  Eye Contact:  Fair  Speech:  Slow  Volume:  Decreased  Mood:  Anxious and Depressed  Affect:  Congruent  Thought Process:  Goal Directed  Orientation:  Full (Time, Place, and Person)  Thought Content:  Rumination  Suicidal Thoughts:  No  Homicidal Thoughts:  No  Memory:  Immediate;   Fair Recent;   Fair Remote;   Fair  Judgement:  Fair  Insight:  Good  Psychomotor Activity:  Decreased  Concentration:  Concentration: Fair and Attention Span: Fair  Recall:  [employer] of Knowledge:Good  Language: Good  Akathisia:  No  Handed:  Right  AIMS (if indicated):  0  Assets:  Communication Skills Desire for Improvement Resilience  ADL's:  Intact  Cognition: WNL  Sleep:  Fair    Assessment: Major depressive disorder, recurrent mild.  Anxiety disorder NOS  Plan: I review her symptoms, history, psychosocial stressors in current medication.  Currently she is taking Cymbalta, amitriptyline and Neurontin but she is not feeling any improvement.  I recommended to discontinue amitriptyline and Cymbalta.  Recommended to try Wellbutrin XL 150 mg daily to help the depression and anxiety.  We discussed that Cymbalta may cause withdrawal symptoms so she should taper it gradually.  I will also add low-dose trazodone to help insomnia.  I do  believe patient requires counseling and we will schedule appointment with therapist in this office.  Discussed medication side effects and benefits.  Recommended to call us back if she has any question, concern or worsening of the symptom.  Follow-up in 4 weeks.  Discuss safety plan that anytime having active suicidal thoughts or homicidal thoughts and she need to call 911 or go to the local emergency room.  Samyia Motter T., MD 3/1/20183:33 PM

## 2016-11-08 ENCOUNTER — Other Ambulatory Visit: Payer: Self-pay

## 2016-11-08 ENCOUNTER — Other Ambulatory Visit (HOSPITAL_COMMUNITY): Payer: Self-pay | Admitting: Pharmacist

## 2016-11-08 MED ORDER — RIOCIGUAT 1 MG PO TABS: 1.0000 mg | ORAL_TABLET | Freq: Three times a day (TID) | ORAL | 11 refills | Status: DC

## 2016-11-08 NOTE — Patient Outreach (Signed)
[name] [name] [facility]) Care Management  11/08/16  [name] [name] 1961-08-01 [id number]  RNCM received voicemail from patient requesting callback.   Successful outreach completed with patient. Patient identification verified.  Patient stated that she has not been feeling very well. She reported that she had a rough night last night due to pain in her chest and arm. She reported she has still not been able to pick up the Flector patches that was ordered to help with pain due to the cost.  She reported that Dr. Haroldine [name] also started her on a new medication called Adempas. She stated that the result of her test showed that her blood pressure was still up in her lungs. She stated that it was supposed to be in the 40's, but hers was still somewhere in the 60's. She stated that she was told that the monthly copay for this medicine was $10,000. She stated that her insurance was going to cover everything but $1,050 and she could still not afford this. She stated that she was accepted into a payment assistance program for this medication and now she will not have to pay anything out of pocket. She has not yet started this medication because it is a specialty med and will be shipped to her no sometime next week.  She stated that she had her first psych appointment yesterday. She feels positive about this, but is concerned because he is wanting to change all of her medications. She stated that she is no longer to take gabapentin and that he plans to wean her off cymbalta. She has a new prescription for wellbutrin, but she has not yet started this and has not stopped the gabapentin yet. She became tearful on the phone talking about how concerned she was about changing all of her medications. She stated she wanted to talk with her medical doctors before changing any medications that could be helping her pain and sleep. She stated that the psych doctor told her she was taking too many medications and has too  many "as needed" medications. She stated that she plans to talk with her PCP before making any medication changes.  She also reported that her CPAP supplies have not arrived yet. She stated that the last information she received was that they were behind and everything was delayed.   RNCM provided reassurance to patient and offered support while adjusting her medications. Encouraged to talk with her providers about how to best adjust her medications and encourage that they communicate due to her health conditions and needs before psych changes her medications. She reported she would be calling next week to schedule appointments for follow up with her PCP.  RNCM to follow up next week. Patient encouraged to call with any questions or concerns.  [name] R. Madisson Kulaga, RN, BSN, [name] [name] Management Coordinator [phone]

## 2016-11-11 ENCOUNTER — Ambulatory Visit (INDEPENDENT_AMBULATORY_CARE_PROVIDER_SITE_OTHER): Payer: Medicare HMO | Admitting: Psychiatry

## 2016-11-11 DIAGNOSIS — F331 Major depressive disorder, recurrent, moderate: Secondary | ICD-10-CM

## 2016-11-11 NOTE — Progress Notes (Signed)
Comprehensive Clinical Assessment (CCA) Note  11/11/2016 [name] [name] [id number]  Visit Diagnosis:   No diagnosis found.    CCA Part One  Part One has been completed on paper by the patient.  (See scanned document in Chart Review)  CCA Part Two A  Intake/Chief Complaint:  CCA Intake With Chief Complaint CCA Part Two Date: 11/11/16 Chief Complaint/Presenting Problem: major depressive disorder; anxiety  Mental Health Symptoms Depression:  Depression: Increase/decrease in appetite, Difficulty Concentrating, Sleep (too much or little), Irritability, Tearfulness  Mania:  Mania: N/A  Anxiety:   Anxiety: Restlessness, Worrying, Sleep, Irritability, Difficulty concentrating, Tension (about 4 hours a day)  Psychosis:  Psychosis: N/A  Trauma:  Trauma: N/A  Obsessions:  Obsessions: N/A  Compulsions:  Compulsions: N/A  Inattention:  Inattention: N/A  Hyperactivity/Impulsivity:  Hyperactivity/Impulsivity: N/A  Oppositional/Defiant Behaviors:  Oppositional/Defiant Behaviors: N/A  Borderline Personality:  Emotional Irregularity: N/A  Other Mood/Personality Symptoms:      Mental Status Exam Appearance and self-care  Stature:  Stature: Average  Weight:  Weight: Obese  Clothing:  Clothing: Casual  Grooming:  Grooming: Normal  Cosmetic use:  Cosmetic Use: Age appropriate  Posture/gait:  Posture/Gait: Normal  Motor activity:  Motor Activity: Not Remarkable  Sensorium  Attention:  Attention: Normal  Concentration:  Concentration: Normal  Orientation:  Orientation: X5  Recall/memory:  Recall/Memory: Normal  Affect and Mood  Affect:  Affect: Depressed  Mood:  Mood: Depressed  Relating  Eye contact:  Eye Contact: Normal  Facial expression:  Facial Expression: Responsive  Attitude toward examiner:  Attitude Toward Examiner: Cooperative  Thought and Language  Speech flow: Speech Flow: Normal  Thought content:     Preoccupation:     Hallucinations:     Organization:     Publishing rights manager of Knowledge:  Fund of Knowledge: Average  Intelligence:  Intelligence: Average  Abstraction:  Abstraction: Normal  Judgement:  Judgement: Normal  Reality Testing:  Reality Testing: Realistic  Insight:  Insight: Good  Decision Making:  Decision Making: Normal  Social Functioning  Social Maturity:  Social Maturity: Responsible  Social Judgement:  Social Judgement: Normal  Stress  Stressors:  Stressors: Grief/losses, Transitions, Illness, Money  Coping Ability:  Coping Ability: Exhausted  Skill Deficits:     Supports:      Family and Psychosocial History: Family history Marital status: Married What types of issues is patient dealing with in the relationship?: worries that she is burden on her husband; taking care of minor child so no time for themselves Does patient have children?: Yes How many children?: 2 How is patient's relationship with their children?: infant daughter passed in 22; Pt, has an adult son and has a very good supportive relationship  Childhood History:  Childhood History By whom was/is the patient raised?: Both parents Additional childhood history information: both parents living Description of patient's relationship with caregiver when they were a child: good relationships with both parents Patient's description of current relationship with people who raised him/her: good with both parents Does patient have siblings?: Yes Number of Siblings: 5 Description of patient's current relationship with siblings: 3 sisters and two half brothers Did patient suffer any verbal/emotional/physical/sexual abuse as a child?: No Did patient suffer from severe childhood neglect?: No Has patient ever been sexually abused/assaulted/raped as an adolescent or adult?: No Was the patient ever a victim of a crime or a disaster?: No Witnessed domestic violence?: No Has patient been effected by domestic violence as an adult?: No  CCA Part  Two B  Employment/Work  Situation: Employment / Work Situation Employment situation: On disability Why is patient on disability: knees; lower back; severe heart problems How long has patient been on disability: about a year Patient's job has been impacted by current illness: No What is the longest time patient has a held a job?: Pt. always worked until last year Where was the patient employed at that time?: [employer] Has patient ever served in combat?: No Did You Receive Any Psychiatric Treatment/Services While in Passenger transport manager?: No Are There Guns or Other Weapons in [place]?: No  Education: Education Last Grade Completed: 12 Did Teacher, adult education From [employer]?: Yes Did You Have An Individualized Education Program (IIEP): No Did You Have Any Difficulty At [employer]?: No  Religion: Religion/Spirituality Are You A Religious Person?: Yes What is Your Religious Affiliation?: Personal assistant: Leisure / Recreation Leisure and Hobbies: movies; concerts; art shows- but has not felt like doing anything recently  Exercise/Diet: Exercise/Diet Do You Exercise?: No (pt. is heart patient and was advised not to exercise at the moment) Have You Gained or Lost A Significant Amount of Weight in the Past Six Months?: Yes-Gained Do You Follow a Special Diet?: No (Pt. is scheduled to see nutritionist tomorrow) Do You Have Any Trouble Sleeping?: Yes  CCA Part Two C  Alcohol/Drug Use: Alcohol / Drug Use Prescriptions: tylenol with codeine Over the Counter: extra strength tylenol and tylenol PM History of alcohol / drug use?: No history of alcohol / drug abuse                      CCA Part Three  ASAM's:  Six Dimensions of Multidimensional Assessment  Dimension 1:  Acute Intoxication and/or Withdrawal Potential:     Dimension 2:  Biomedical Conditions and Complications:     Dimension 3:  Emotional, Behavioral, or Cognitive Conditions and Complications:     Dimension 4:  Readiness to  Change:     Dimension 5:  Relapse, Continued use, or Continued Problem Potential:     Dimension 6:  Recovery/Living Environment:      Substance use Disorder (SUD)    Social Function:  Social Functioning Social Maturity: Responsible Social Judgement: Normal  Stress:  Stress Stressors: Grief/losses, Transitions, Illness, Money Coping Ability: Exhausted Patient Takes Medications The Way The Doctor Instructed?: Yes  Risk Assessment- Self-Harm Potential: Risk Assessment For Self-Harm Potential Thoughts of Self-Harm: No current thoughts Method: No plan Availability of Means: No access/NA  Risk Assessment -Dangerous to Others Potential: Risk Assessment For Dangerous to Others Potential Method: No Plan Availability of Means: No access or NA Intent: Vague intent or NA Notification Required: No need or identified person  DSM5 Diagnoses: Patient Active Problem List   Diagnosis Date Noted  . GAD (generalized anxiety disorder) 08/14/2016  . Morbid obesity ([place]) 08/14/2016  . Stroke ([place]) 06/27/2016  . Right sided weakness 06/27/2016  . HTN (hypertension) 06/27/2016  . Atypical chest pain 06/27/2016  . Difficult airway 03/29/2016  . 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 OSA on CPAP 02/20/2016  . Chronic pain syndrome 01/31/2016  . Vaginal bleeding 11/20/2015  . Hematochezia 11/20/2015  . Pulmonary embolus ([place]) 11/15/2015  . Vertigo 11/15/2015  . Depression 11/15/2015    Patient Centered Plan: Patient is on the following Treatment Plan(s):  Pt. To follow up with Dellia Nims for MH-IOP and scheduled for first available with Eloise Levels Ph.D., [facility] for April 19.  Recommendations for Services/Supports/Treatments: Recommendations  for Services/Supports/Treatments Recommendations For Services/Supports/Treatments: Medication Management, IOP (Intensive Outpatient Program), Individual Therapy (Pt. was given information about genetic testing for  psychoactive medications )  Treatment Plan Summary:    Referrals to Alternative Service(s): Referred to Alternative Service(s):   Place:   Date:   Time:    Referred to Alternative Service(s):   Place:   Date:   Time:    Referred to Alternative Service(s):   Place:   Date:   Time:    Referred to Alternative Service(s):   Place:   Date:   Time:     Nancie Neas

## 2016-11-12 ENCOUNTER — Telehealth: Payer: Self-pay | Admitting: Physician Assistant

## 2016-11-12 NOTE — Telephone Encounter (Signed)
New Message ° °Patient calling the office for samples of medication: ° ° °1.  What medication and dosage are you requesting samples for? Xarelto 20mg ° °2.  Are you currently out of this medication? yes ° ° ° ° °

## 2016-11-12 NOTE — Telephone Encounter (Signed)
Patients pcp has been refilling the xarelto so I wanted to clarify if okay to provide patient with samples. Please advise. Thanks, MI

## 2016-11-14 ENCOUNTER — Telehealth (HOSPITAL_COMMUNITY): Payer: Self-pay | Admitting: Pharmacist

## 2016-11-14 NOTE — Telephone Encounter (Signed)
[name] [name] called stating she had still not received her Adempas yet. I have called the [place] program who stated that they are waiting for her to fill out the patient assistance application and fax it back to [place]. If she qualifies, she will receive the Adempas from [place] at no cost to her for a year. She should be receiving the application in the mail today. I have called and left her VM to look out for the application and return it as soon as possible.   [name] [name]. [name] [name], PharmD, BCPS, CPP Clinical Pharmacist Pager: [phone] Phone: [phone] 11/14/2016 2:52 PM

## 2016-11-15 ENCOUNTER — Encounter (HOSPITAL_COMMUNITY): Payer: Self-pay

## 2016-11-15 ENCOUNTER — Ambulatory Visit (HOSPITAL_COMMUNITY)
Admission: RE | Admit: 2016-11-15 | Discharge: 2016-11-15 | Disposition: A | Discharge status: Home / Self Care | Payer: Medicare HMO | Source: Ambulatory Visit | Attending: Internal Medicine | Admitting: Internal Medicine

## 2016-11-15 DIAGNOSIS — Z4689 Encounter for fitting and adjustment of other specified devices: Secondary | ICD-10-CM | POA: Diagnosis not present

## 2016-11-15 MED ORDER — HEPARIN SOD (PORK) LOCK FLUSH 100 UNIT/ML IV SOLN
500.0000 [IU] | INTRAVENOUS | Status: DC
  Administered 2016-11-15: 500 [IU]
  Filled 2016-11-15: qty 5

## 2016-11-16 ENCOUNTER — Other Ambulatory Visit: Payer: Self-pay | Admitting: Physical Medicine & Rehabilitation

## 2016-11-18 ENCOUNTER — Ambulatory Visit: Payer: Self-pay | Admitting: Internal Medicine

## 2016-11-19 NOTE — Telephone Encounter (Signed)
Ok to provide samples, can address medication assistance at follow up with pharmacy team

## 2016-11-19 NOTE — Telephone Encounter (Signed)
Please advise on below. Thanks, MI

## 2016-11-19 NOTE — Telephone Encounter (Signed)
Spoke with patient and she was able to get the prescription refilled at the pharmacy. She does not need samples at this time.

## 2016-11-21 ENCOUNTER — Ambulatory Visit (HOSPITAL_COMMUNITY)
Admission: RE | Admit: 2016-11-21 | Discharge: 2016-11-21 | Disposition: A | Discharge status: Home / Self Care | Payer: Medicare HMO | Source: Ambulatory Visit | Attending: Cardiology | Admitting: Cardiology

## 2016-11-21 ENCOUNTER — Telehealth (HOSPITAL_COMMUNITY): Payer: Self-pay | Admitting: Pharmacist

## 2016-11-21 VITALS — BP 138/96 | HR 112 | Wt 321.2 lb

## 2016-11-21 DIAGNOSIS — R0602 Shortness of breath: Secondary | ICD-10-CM

## 2016-11-21 DIAGNOSIS — G8929 Other chronic pain: Secondary | ICD-10-CM | POA: Insufficient documentation

## 2016-11-21 DIAGNOSIS — I272 Pulmonary hypertension, unspecified: Secondary | ICD-10-CM | POA: Diagnosis not present

## 2016-11-21 DIAGNOSIS — I10 Essential (primary) hypertension: Secondary | ICD-10-CM

## 2016-11-21 DIAGNOSIS — F329 Major depressive disorder, single episode, unspecified: Secondary | ICD-10-CM | POA: Insufficient documentation

## 2016-11-21 DIAGNOSIS — E8770 Fluid overload, unspecified: Secondary | ICD-10-CM | POA: Diagnosis not present

## 2016-11-21 DIAGNOSIS — G4733 Obstructive sleep apnea (adult) (pediatric): Secondary | ICD-10-CM | POA: Insufficient documentation

## 2016-11-21 DIAGNOSIS — M069 Rheumatoid arthritis, unspecified: Secondary | ICD-10-CM | POA: Insufficient documentation

## 2016-11-21 DIAGNOSIS — I2699 Other pulmonary embolism without acute cor pulmonale: Secondary | ICD-10-CM | POA: Insufficient documentation

## 2016-11-21 DIAGNOSIS — Z7901 Long term (current) use of anticoagulants: Secondary | ICD-10-CM | POA: Diagnosis not present

## 2016-11-21 DIAGNOSIS — R079 Chest pain, unspecified: Secondary | ICD-10-CM | POA: Diagnosis not present

## 2016-11-21 DIAGNOSIS — F419 Anxiety disorder, unspecified: Secondary | ICD-10-CM | POA: Diagnosis not present

## 2016-11-21 DIAGNOSIS — M797 Fibromyalgia: Secondary | ICD-10-CM | POA: Insufficient documentation

## 2016-11-21 DIAGNOSIS — I2724 Chronic thromboembolic pulmonary hypertension: Secondary | ICD-10-CM | POA: Diagnosis not present

## 2016-11-21 MED ORDER — FUROSEMIDE 40 MG PO TABS: 40.0000 mg | ORAL_TABLET | Freq: Every day | ORAL | 3 refills | Status: DC

## 2016-11-21 NOTE — Progress Notes (Signed)
Advanced Heart Failure Medication Review by a Pharmacist  Does the patient  feel that his/her medications are working for him/her?  yes  Has the patient been experiencing any side effects to the medications prescribed?  no  Does the patient measure his/her own blood pressure or blood glucose at home?  yes   Does the patient have any problems obtaining medications due to transportation or finances?   no  Understanding of regimen: good Understanding of indications: good Potential of compliance: good Patient understands to avoid NSAIDs. Patient understands to avoid decongestants.  Issues to address at subsequent visits: Adempas and [facility]  Pharmacist comments: [name] [name] is a pleasant 56 yo AAF presenting to advanced HF clinic today. She hasn't started Adempas yet - still waiting on [facility]. She endorses compliance with her medications. No questions or concerns for me at this time.   [name] [name], PharmD PGY1 Pharmacy Resident [phone] (Pager) 11/21/2016 3:30 PM  Time with patient: 10 min Preparation and documentation time: 2 min Total time: 12 min

## 2016-11-21 NOTE — Patient Instructions (Signed)
Will refer you to pulmonary rehab at [facility]. They will call you to set up initial appointment.  INCREASE Lasix to 40 mg twice daily for TWO DAYS. Then reduce back to normal dose of 40 mg tablet once daily. Can take EXTRA 40 mg Lasix tablet once in the afternoons if morning weight 3 lbs or greater.  Follow up 2-3 weeks with Dr. Haroldine Laws.  Do the following things EVERYDAY: 1) Weigh yourself in the morning before breakfast. Write it down and keep it in a log. 2) Take your medicines as prescribed 3) Eat low salt foods-Limit salt (sodium) to 2000 mg per day.  4) Stay as active as you can everyday 5) Limit all fluids for the day to less than 2 liters

## 2016-11-21 NOTE — Telephone Encounter (Signed)
Ms. Bartko called stating that she was not sure what was still needed for the Adempas to be approved. I have called [facility]emp[employer] and was told that they were awaiting the decision from the manufacturer patient assistance program which they should have by the end of the day. I have relayed this info to Ms. Dace. She also was concerned because she has been more SOB and her weight has increased from about 308-319 in the past 2-3 weeks. I have spoken with Darrick Grinder, NP-C who will see her this afternoon in our clinic.   Ruta Hinds. Velva Harman, PharmD, BCPS, CPP Clinical Pharmacist Pager: [phone] Phone: [phone] 11/21/2016 12:20 PM

## 2016-11-21 NOTE — Progress Notes (Signed)
ADVANCED HF CLINIC NOTE  Date:  11/21/2016   ID:  [name] [name], DOB Jan 20, 1961, MRN [id number]  PCP:  Maren Reamer, MD  HF MD: Dr Haroldine Laws    HPI: 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   Patient was in her [place] until 08/2015 when she started getting symptoms of dyspnea on exertion, dizziness, bilateral LE swelling, and constant R>L non-specific chest pain. These symptoms progressively worsened over several days prompting her to go to [facility] in her then-hometown of [place] in 09/2015 where she was diagnosed with PE and started on anticoagulation. She was very briefly on warfarin but quickly switched to Francetta Found was referred to [facility] in 12/2015. Patient refused LHC because of financial fears and unclear insurance status. Echocardiogram (12-2015) showed normal LVEF 55-60%, normal LA, normal RV size, RVSP estimated as 70 mm Hg. She further was referred to [facility] and saw Dr. Karena Addison on 03/14/2016 where patient had several studies including V/Q scan which found high prob for PE; studies c/w CTEPH. It was felt that her chronic chest pain was caused by chronic PE. RHC/LHC/Pulm Angiogram 03/2016 confirmed diagnosis of CTEPH. She underwent pulmonary thromboembolectomy 03/31/2016 at [facility] with IVC filter placement. Pre-op cath with no CAD.   Seen by Dr. Karena Addison in 04/2016 for follow up. He felt like her heart size was decreasing. Plan was for lifelong [place] and ECHO and V/Q in 4 months. Dr. Karena Addison has since left the [facility] practice.  CT angio at [facility] on 05/15/16 showed " persistent but partially recanalized PE within right lower lobe pulmonary artery. No new PE identified. "  She was admitted to [facility] in 06/2016 for continued chest pain and parasthesias. V/Q scan on 06/27/16 showed  prominent ventilation perfusion mismatch in right c/w PE. Head CT negative. She was continued on Xarelto and told to follow up with [facility]. Patient doesn't want to have to travel to [facility] for care so [place] care here in [place].   Saw Nell Range recently for ongoing CP. Referred here for further management of CP and PAH. Echo 10/07/16. EF 55%. RV normal RVSP 70mmHG  Today she returns for [facility] follow up. Since the last visit she had RHC with plan to start  adempus. She has completed medication assistance form. Overall she is feeling bad and overwhelmed medical condition and family problems. Today she is complaining of intermittent cp and dizziness. Lidoderm patch no working well. Weight at home 300-320 pounds. SOB with exertion. Denies syncope. Denies PND. Ambulates with cane. Taking all medications. Has difficulty paying for medications.    10/2016 RHC RA = 8 RV = 64/13 PA = 65/19 (35) PCW = 9 Fick cardiac output/index = 7.5/3.1 PVR = 3.5 WU Ao sat = 96% PA sat = 68%,70% SVC sat = 71% Assessment: 1. Mild residual PAH in the setting of CTEPH s/p pulmonary thrombolectomy 2. Normal cardiac output 3. Normal left-sided filling pressures  Past Medical History:  Diagnosis Date  . Arthritis   . Depression   . Diverticulitis   . Hypertension   . Obesity   . Pneumonia   . Pulmonary embolism [facility])     Past Surgical History:  Procedure Laterality Date  . ABDOMINAL HYSTERECTOMY    . BREAST REDUCTION SURGERY    . CHOLECYSTECTOMY    .  EMBOLECTOMY N/A    pulmonary embolectomy  . RIGHT HEART CATH N/A 10/28/2016   Procedure: Right Heart Cath;  Surgeon: Jolaine Artist, MD;  Location: [facility];  Service: Cardiovascular;  Laterality: N/A;    Current Medications:  (Not in an outpatient encounter)   Allergies:   [name].   Social History   Social History  . Marital status: Married    Spouse name: N/A  . Number of children: N/A  . Years  of education: N/A   Social History Main Topics  . Smoking status: Never Smoker  . Smokeless tobacco: Never Used  . Alcohol use No  . Drug use: No  . Sexual activity: Yes    Partners: Male    Birth control/ protection: Surgical   Other Topics Concern  . Not on file   Social History Narrative  . No narrative on file     Family History:  The patient's family history includes Anxiety disorder in her cousin, sister, and sister; Bipolar disorder in her sister and sister; Dementia in her father and mother; Drug abuse in her sister and sister; Lung cancer in her maternal grandmother; Sexual abuse in her maternal aunt.      ROS:   Please see the history of present illness.    ROS All other systems reviewed and are negative.   PHYSICAL EXAM:   VS:  BP (!) 138/96 (BP Location: Left Wrist, Patient Position: Sitting, Cuff Size: Large)   Pulse (!) 112 Comment: tachy  Wt (!) 321 lb 3.2 oz (145.7 kg)   SpO2 94%   BMI 53.45 kg/m    GEN: Obese, appears fatigued. Husband present.   HEENT: normal  Neck: neck thik. JVD 9-10carotid bruits, or masses Cardiac: RRR; no murmurs, rubs, or gallops,no edema  Respiratory:  Decreased in the bases. On room air.  GI: obese soft, NT, ND + BS MS: no deformity or atrophy  Skin: warm and dry, no rash Neuro:  Alert and Oriented x 3, Strength and sensation are intact Psych: Tearful.   Wt Readings from Last 3 Encounters:  11/21/16 (!) 321 lb 3.2 oz (145.7 kg)  11/15/16 (!) 319 lb 9.6 oz (145 kg)  10/28/16 (!) 314 lb (142.4 kg)      Studies/Labs Reviewed:    Recent Labs: 03/20/2016: B Natriuretic Peptide 88.3 10/24/2016: ALT 9; BUN 11; Creatinine, Ser 1.04; Hemoglobin 11.5; Platelets 257; Potassium 4.1; Sodium 142   Lipid Panel    Component Value Date/Time   CHOL 166 06/27/2016 0346   TRIG 125 06/27/2016 0346   HDL 33 (L) 06/27/2016 0346   CHOLHDL 5.0 06/27/2016 0346   VLDL 25 06/27/2016 0346   LDLCALC 108 (H) 06/27/2016 0346    Additional  studies/ records that were reviewed today include:   2D ECHO: 12/15/2015 LV EF: 55% -   60% Study Conclusions - Left ventricle: The cavity size was normal. Wall thickness was normal. Systolic function was normal. The estimated ejection  fraction was in the range of 55% to 60%. - Pulmonary arteries: PA peak pressure: 70 mm Hg (S). - Pericardium, extracardiac: A trivial pericardial effusion was   identified.   2D ECHO 04/05/16 INTERPRETATION:NORMAL LEFT VENTRICULAR FUNCTION WITH MILD LVHSEVERE RV SYSTOLIC DYSFUNCTION (See above) VALVULAR REGURGITATION: TRIVIAL PR, MILD TR NO VALVULAR STENOSIS TRIVIAL PERICARDIAL EFFUSION Compared with prior Echo study on 03/14/2016: RVSP increased from 50 to 69   CATH right heart and coronary angiography: 03/25/2016 [place]  Component Name Value Ref Range  Cardiac Index (l/min/m2) 2.3 L/min/m2  Right Atrium Mean Pressure (mmHg) 11 mmHg   Right Ventricle Systolic Pressure (mmHg) 68 mmHg   Pulmonary Artery Mean Pressure (mmHg) 40 mmHg   Pulmonary Wedge Pressure (mmHg) 14 mmHg   Pulmonary Vascular Resistance (Wood units) 4.7    CT angio 05/15/16 IMPRESSION: Persistent but partially recanalized pulmonary embolus within the right lower lobe pulmonary artery. No new pulmonary embolism is identified. Minimal bibasilar atelectatic changes stable from the prior exam.   V/Q scan 06/27/16 IMPRESSION: Prominent ventilation perfusion mismatch noted on the right. Finding suggests high probability right-sided pulmonary embolus in this patient with known right-sided pulmonary embolus.   ASSESSMENT & PLAN:   1. Chronic chest pain:  --LHC performed at [facility] 7/17 - no CAD --per [facility] notes in [place], felt to be from her chronic PEs and pulmonary HTN.  -Trying lidoderm patches.  2. 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  --Echo with RVPS 49mmHG. RHC with mild  PAH. Plan to start adempas once financial assistance completed.  Mild volume overload. Increase lasix to 40 mg twice a day then back to 40 mg daily. Instructed to take an extra 40 mg of lasix for 3 pound weight gain.  I ambulated her in the clinic she is not hypoxic.Refer to pulmonary rehab.  Do the following things EVERYDAY: 1) Weigh yourself in the morning before breakfast. Write it down and keep it in a log. 2) Take your medicines as prescribed 3) Eat low salt foods-Limit salt (sodium) to 2000 mg per day.  4) Stay as active as you can everyday 5) Limit all fluids for the day to less than 2 liters 3. HTN:  --BP ok today.  4. Fibromyalgia/Rheumatoid Arthitis:  - Recently diagnosed. Sees rheumatology 12/2016 to see if any autoimmune process is contributing to her chronic pain.  - Will start topical NSAIDs for presumed arthritic pain in chest 5. Morbid obesity with OHS:  - Continue to pursue bariatric surgery.  6. OSA - stressed need to get supplies to restart CPAP  Referred to HFSW for coping strategies.  Greater than 50% of the (total minutes 25) visit spent in counseling/coordination of care regarding pulmonary hypertension and volume overload.  Follow up 2-3 weeks.    Taina Landry NP-C  3:32 PM

## 2016-11-21 NOTE — Progress Notes (Signed)
CSW referred to provide supportive intervention with patient and husband. Patient was tearful and spoke at length about multiple life events and her declining health. Patient and husband have care of their 55yo grandson who has had some recent mental health issues and currently hospitalized. Patient stated she has a therapist who she recently started seeing due to overwhelming social and health issues. Patient denies any suicidal ideation but admits to depression. Patient states she and her husband have been married for 25 years and he is very supportive. CSW discussed coping strategies and provided supportive intervention. Patient stated "I was meant to come to this clinic today". She was very grateful for support and asked for CSW card for future need. Patient appears relied after conversation and aware of her own coping strategies. Patient appears motivated for improved health. CSW encouraged patient to follow up with therapist and return call to CSW if needed. [name] [name], [place], [place]

## 2016-11-26 ENCOUNTER — Telehealth: Payer: Self-pay

## 2016-11-26 ENCOUNTER — Ambulatory Visit: Payer: Medicare HMO | Attending: Internal Medicine | Admitting: Internal Medicine

## 2016-11-26 ENCOUNTER — Encounter: Payer: Self-pay | Admitting: Internal Medicine

## 2016-11-26 VITALS — BP 132/80 | HR 70 | Temp 97.8°F | Resp 16 | Wt 321.8 lb

## 2016-11-26 DIAGNOSIS — I10 Essential (primary) hypertension: Secondary | ICD-10-CM | POA: Diagnosis not present

## 2016-11-26 DIAGNOSIS — F419 Anxiety disorder, unspecified: Secondary | ICD-10-CM | POA: Diagnosis not present

## 2016-11-26 DIAGNOSIS — K648 Other hemorrhoids: Secondary | ICD-10-CM

## 2016-11-26 DIAGNOSIS — Z1159 Encounter for screening for other viral diseases: Secondary | ICD-10-CM

## 2016-11-26 DIAGNOSIS — I2782 Chronic pulmonary embolism: Secondary | ICD-10-CM | POA: Diagnosis not present

## 2016-11-26 DIAGNOSIS — M797 Fibromyalgia: Secondary | ICD-10-CM | POA: Diagnosis not present

## 2016-11-26 DIAGNOSIS — I2699 Other pulmonary embolism without acute cor pulmonale: Secondary | ICD-10-CM | POA: Diagnosis not present

## 2016-11-26 DIAGNOSIS — G4733 Obstructive sleep apnea (adult) (pediatric): Secondary | ICD-10-CM | POA: Diagnosis not present

## 2016-11-26 DIAGNOSIS — F329 Major depressive disorder, single episode, unspecified: Secondary | ICD-10-CM | POA: Diagnosis not present

## 2016-11-26 DIAGNOSIS — Z86711 Personal history of pulmonary embolism: Secondary | ICD-10-CM | POA: Insufficient documentation

## 2016-11-26 DIAGNOSIS — Z7901 Long term (current) use of anticoagulants: Secondary | ICD-10-CM | POA: Diagnosis not present

## 2016-11-26 DIAGNOSIS — G894 Chronic pain syndrome: Secondary | ICD-10-CM | POA: Diagnosis not present

## 2016-11-26 DIAGNOSIS — I2724 Chronic thromboembolic pulmonary hypertension: Secondary | ICD-10-CM

## 2016-11-26 MED ORDER — HYDROCORTISONE ACETATE 25 MG RE SUPP: 25.0000 mg | Freq: Two times a day (BID) | RECTAL | 0 refills | Status: DC

## 2016-11-26 MED ORDER — RIVAROXABAN 20 MG PO TABS: 20.0000 mg | ORAL_TABLET | Freq: Every day | ORAL | 3 refills | Status: DC

## 2016-11-26 MED ORDER — DICYCLOMINE HCL 20 MG PO TABS: 20.0000 mg | ORAL_TABLET | Freq: Two times a day (BID) | ORAL | 0 refills | Status: DC

## 2016-11-26 MED ORDER — METOPROLOL TARTRATE 25 MG PO TABS: 25.0000 mg | ORAL_TABLET | Freq: Two times a day (BID) | ORAL | 3 refills | Status: DC

## 2016-11-26 MED ORDER — GABAPENTIN 300 MG PO CAPS: 300.0000 mg | ORAL_CAPSULE | Freq: Three times a day (TID) | ORAL | 1 refills | Status: DC

## 2016-11-26 MED ORDER — ACETAMINOPHEN-CODEINE #3 300-30 MG PO TABS: 1.0000 | ORAL_TABLET | Freq: Three times a day (TID) | ORAL | 0 refills | Status: DC | PRN

## 2016-11-26 MED ORDER — ATORVASTATIN CALCIUM 20 MG PO TABS: 20.0000 mg | ORAL_TABLET | Freq: Every day | ORAL | 2 refills | Status: DC

## 2016-11-26 MED ORDER — AMLODIPINE BESYLATE 5 MG PO TABS: 5.0000 mg | ORAL_TABLET | Freq: Every day | ORAL | 3 refills | Status: DC

## 2016-11-26 MED ORDER — POTASSIUM CHLORIDE ER 10 MEQ PO TBCR: 10.0000 meq | EXTENDED_RELEASE_TABLET | Freq: Two times a day (BID) | ORAL | 2 refills | Status: DC

## 2016-11-26 NOTE — Progress Notes (Signed)
[name] [name], is a 56 y.o. female  YNW:[id number]  MVH:[id number]  DOB - [birth year]  Chief Complaint  Patient presents with  . Medication Refill        Subjective:   [name] [name] is a 56 y.o. female here today for a follow up visit.  She was last seen in our clinic 10/01/16.    Extensive PMHx, copped off of Apryl Anderson PharmD note 11/21/16: Ms. 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   Patient was in her [place] until 08/2015 when she started getting symptoms of dyspnea on exertion, dizziness, bilateral LE swelling, and constant R>L non-specific chest pain. These symptoms progressively worsened over several days prompting her to go to [facility] in her then-hometown of [place] in 09/2015 where she was diagnosed with PE and started on anticoagulation. She was very briefly on warfarin but quickly switched to Francetta Found was referred to [facility] in 12/2015. Patient refused LHC because of financial fears and unclear insurance status. Echocardiogram (12-2015) showed normal LVEF 55-60%, normal LA, normal RV size, RVSP estimated as 70 mm Hg. She further was referred to [facility] and saw Dr. Karena Addison on 03/14/2016 where patient had several studies including V/Q scan which found high prob for PE; studies c/w CTEPH. It was felt that her chronic chest pain was caused by chronic PE. RHC/LHC/Pulm Angiogram 03/2016 confirmed diagnosis of CTEPH. She underwent pulmonary thromboembolectomy 03/31/2016 at [facility] with IVC filter placement. Pre-op cath with no CAD.   Seen by Dr. Karena Addison in 04/2016 for follow up. He felt like her heart size was decreasing. Plan was for lifelong [place] and ECHO and V/Q in 4 months. Dr. Karena Addison has since left the [facility] practice.  CT angio at [facility] on 05/15/16 showed " persistent but partially  recanalized PE within right lower lobe pulmonary artery. No new PE identified. "  She was admitted to [facility] in 06/2016 for continued chest pain and parasthesias. V/Q scan on 06/27/16 showed prominent ventilation perfusion mismatch in right c/w PE. Head CT negative. She was continued on Xarelto and told to follow up with [facility]. Patient doesn't want to have to travel to [facility] for care so [place] care here in [place].   Saw Nell Range recently for ongoing CP. Referred here for further management of CP and PAH. Echo 10/07/16. EF 55%. RV normal RVSP 36mmHG.  Of note, today she is here for f/u. She has had f/u w/ psychiatry as well and her meds were recently adjusted. She has been taking welbutrin now for about 4 days and is weaning herself down from cymbalta.  Overall she is in good spirits and trying to get by daily.  Lots of stressors recently w/ sick ppl in her family.  Of note, she still has not been able to get her cpap supplies, despite the fact that our CM has been working diligently for the last few months to get it for her. Pt states the company keeps telling her "they are behind."  She also notes that she was recently able to pick up her Adempas, for her Pulm HTN, but has not started it.  Waiting for the RN to come by to help her take it.  She has now also been going to Bariatric evaluation at [facility] and interested in that if it will  help her as well. Endorces compliance with better diet. Of note, she is also about to start Pulm rehab as well.   She c/o recently of abd discomfort/constipation and recent episode of internal hemorroids w/ rectal bleeding for which she was seen in ED on 10/24/16.  Patient has No headache, No chest pain, No Nausea, No new weakness tingling or numbness, No Cough - SOB.  No problems updated.  ALLERGIES: No Known Allergies  PAST MEDICAL HISTORY: Past Medical History:  Diagnosis Date  . Arthritis   . Depression   . Diverticulitis   .  Hypertension   . Obesity   . Pneumonia   . Pulmonary embolism ([place])     MEDICATIONS AT HOME: Prior to Admission medications   Medication Sig Start Date End Date Taking? Authorizing Provider  acetaminophen-codeine (TYLENOL #3) 300-30 MG tablet Take 1 tablet by mouth every 8 (eight) hours as needed for moderate pain. Max total Tyelnol < 2000mg  /day 11/26/16   Maren Reamer, MD  albuterol (PROVENTIL HFA;VENTOLIN HFA) 108 (90 Base) MCG/ACT inhaler Inhale 1-2 puffs into the lungs every 6 (six) hours as needed for wheezing or shortness of breath. Patient not taking: Reported on 11/21/2016 02/08/16   Maryan Puls, MD  amLODipine (NORVASC) 5 MG tablet Take 1 tablet (5 mg total) by mouth daily. 08/14/16   Maren Reamer, MD  atorvastatin (LIPITOR) 20 MG tablet Take 1 tablet (20 mg total) by mouth daily at 6 PM. 08/14/16   Maren Reamer, MD  buPROPion (WELLBUTRIN XL) 150 MG 24 hr tablet Take 1 tablet (150 mg total) by mouth every morning. Patient not taking: Reported on 11/08/2016 11/07/16 11/07/17  Kathlee Nations, MD  diclofenac (FLECTOR) 1.3 % PTCH Place 1 patch onto the skin 2 (two) times daily. 10/16/16   Jolaine Artist, MD  diclofenac sodium (VOLTAREN) 1 % GEL Apply 2 g topically 4 (four) times daily. 08/14/16   Maren Reamer, MD  dicyclomine (BENTYL) 20 MG tablet Take 1 tablet (20 mg total) by mouth 2 (two) times daily. 11/26/16   Maren Reamer, MD  docusate sodium (COLACE) 100 MG capsule Take 1 capsule (100 mg total) by mouth every 12 (twelve) hours. 10/24/16   Margarita Mail, PA-C  DULoxetine (CYMBALTA) 60 MG capsule TAKE ONE CAPSULE BY MOUTH ONCE DAILY 09/30/16   Ankit Lorie Phenix, MD  furosemide (LASIX) 40 MG tablet Take 1 tablet (40 mg total) by mouth daily. Take extra 40 mg tablet once in the afternoon AS NEEDED for weight gain 3 lbs or more. 11/21/16   Jolaine Artist, MD  gabapentin (NEURONTIN) 300 MG capsule Take 1 capsule (300 mg total) by mouth 3 (three) times daily. 11/26/16 12/26/16   Maren Reamer, MD  hydrocortisone (ANUSOL-HC) 25 MG suppository Place 1 suppository (25 mg total) rectally 2 (two) times daily. 11/26/16   Maren Reamer, MD  meclizine (ANTIVERT) 25 MG tablet Take 25 mg by mouth 3 (three) times daily as needed for dizziness. 09/30/16   Historical Provider, MD  metoprolol tartrate (LOPRESSOR) 25 MG tablet Take 1 tablet (25 mg total) by mouth 2 (two) times daily. 11/26/16   Maren Reamer, MD  potassium chloride (K-DUR) 10 MEQ tablet Take 1 tablet (10 mEq total) by mouth 2 (two) times daily. 07/30/16   Maren Reamer, MD  Riociguat (ADEMPAS) 1 MG TABS Take 1 mg by mouth 3 (three) times daily. Patient not taking: Reported on 11/21/2016 11/08/16   Shaune Pascal Bensimhon,  MD  rivaroxaban (XARELTO) 20 MG TABS tablet Take 1 tablet (20 mg total) by mouth daily with supper. 08/14/16   Maren Reamer, MD  sodium chloride (OCEAN) 0.65 % SOLN nasal spray Place 1 spray into both nostrils as needed for congestion. Patient not taking: Reported on 11/21/2016 10/14/16   Maren Reamer, MD  traZODone (DESYREL) 50 MG tablet Take 1 tablet (50 mg total) by mouth at bedtime as needed for sleep. Patient not taking: Reported on 11/21/2016 11/07/16   Kathlee Nations, MD  triamcinolone cream (KENALOG) 0.1 % Apply 1 application topically 2 (two) times daily. For rash on left leg Patient taking differently: Apply 1 application topically 2 (two) times daily as needed (for rash.).  05/15/16   Daleen Bo, MD     Objective:   Vitals:   11/26/16 1524  BP: 132/80  Pulse: 70  Resp: 16  Temp: 97.8 F (36.6 C)  TempSrc: Oral  SpO2: 98%  Weight: (!) 321 lb 12.8 oz (146 kg)    Exam General appearance : Awake, alert, not in any distress. Speech Clear. Not toxic looking, morbid obese, but able to speak in full sentences. Good spirits today. HEENT: Atraumatic and Normocephalic, pupils equally reactive to light. Neck: supple, no JVD.  Chest:Good air entry bilaterally, no added sounds. CVS: S1  S2 regular, no murmurs/gallups or rubs. Abdomen: Bowel sounds active, obese, exam limited due to body habitus.  Non tender, no g/r on exam today. Extremities: B/L Lower Ext shows no edema, both legs are warm to touch Neurology: Awake alert, and oriented X 3, CN II-XII grossly intact, Non focal Skin:No Rash  Data Review Lab Results  Component Value Date   HGBA1C 5.6 06/27/2016    Depression screen [facility] 2/9 10/01/2016 08/14/2016 07/05/2016 07/05/2016 05/24/2016  Decreased Interest 1 1 1 1 2   Down, Depressed, Hopeless 2 1 2 2 2   PHQ - 2 Score 3 2 3 3 4   Altered sleeping 3 2 3 3 2   Tired, decreased energy 1 1 1 1 2   Change in appetite 0 0 1 1 2   Feeling bad or failure about yourself  1 0 0 0 2  Trouble concentrating 1 0 1 1 2   Moving slowly or fidgety/restless 0 0 0 0 1  Suicidal thoughts 0 0 0 0 0  PHQ-9 Score 9 5 9 9 15   Difficult doing work/chores - - - - Somewhat difficult  Some recent data might be hidden      Assessment & Plan   1. Internal hemorrhoid, bleeding Given that she is on chronic xarelto, will ask crs and gi to eval. - CBC with Differential - Ambulatory referral to Gastroenterology - Ambulatory referral to Colorectal Surgery - MM Digital Screening; Future - CMP and Liver - renewed bentyl for abd cramps and anusol for now.  2. 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 Other chronic pulmonary embolism without acute cor pulmonale (HCC) Chronic xarelto  4. Essential hypertension Initially elevated, but repeat bp improved. - CMP and Liver - meds per cards., on norvasc 5qd ,   5. Chronic pain syndrome, w/ fibromyalgia and possible ra - has appt w/ Rheum 4/18 - prn  tylenol  #3 for now- renewed today   6. Reactive depression Now seeing psychiatry, on welbutrin now, appreciate assistance   7. Morbid obese w/ osa - still unable to get her cpap supplies, our clinic has been working on it for months now. Opal Sidles, our cm, called the medical supplies store again today to see what is the hold up.  8. Need for hepatitis C screening test - Hepatitis C antibody   Patient have been counseled extensively about nutrition and exercise  Return in about 6 weeks (around 01/07/2017) for phtn, depression, pe.  The patient was given clear instructions to go to ER or return to medical center if symptoms don't improve, worsen or new problems develop. The patient verbalized understanding. The patient was told to call to get lab results if they haven't heard anything in the next week.   This note has been created with Surveyor, quantity. Any transcriptional errors are unintentional.   Maren Reamer, MD, [place] [facility], [place]   11/26/2016, 5:58 PM

## 2016-11-26 NOTE — Telephone Encounter (Signed)
Call placed to [facility] # [phone] to check on the status of the order for a CPAP mask, tubing, filters, cushions and headgear. The prescription had been faxed to the company twice - 09/30/16 and 10/21/16 and receipt of both prescriptions was confirmed.  This CM spoke to [place] and then to [place]. They were not able to determine why the patient has not received her supplies. Tasia then requested to confirm the patient's phone #s, which was done, and then she said that she would call the patient to obtain authorization to send out the order. This CM stressed the importance of the patient receiving these supplies ASAP.

## 2016-11-26 NOTE — Patient Instructions (Addendum)
Ground flax seed -  Fiber source   Anal Stricture Anal stricture, also called anal stenosis, is a narrowing of the opening between the buttocks (anus). This condition makes bowel movements difficult or painful. Anal stricture can range from mild to severe. What are the causes? This condition may be caused by:  Surgery involving the anal canal, such as hemorrhoidectomy.  Abscesses or infections.  Inflammatory bowel disease (IBD), such as Crohn disease and ulcerative colitis.  Injury to the anus.  A tear or crack in the skin around the anus (anal fissure).  Radiation therapy.  An STD (sexually transmitted disease).  Tuberculosis.  Overuse of medicines that help you have a bowel movement (laxatives). What are the signs or symptoms? Symptoms of this condition include:  Pain and pressure during bowel movements.  Bleeding during bowel movements.  Trouble getting stool out.  Constipation.  Discomfort in the anus.  Narrow stools. How is this diagnosed? This condition may be diagnosed based on:  Your medical history.  An exam of your anal canal. During the exam, your health care provider will feel the inside of your rectum.  A test called an anorectal manometry. During this test a small balloon attached to a flexible tube is used to check how your anus is working. How is this treated? Treatment for this condition depends on the cause and severity of the condition. For mild to moderate stricture, nonsurgical treatments are usually successful. Treatments for this condition may involve:  Diet changes. You may need to drink more fluid, eat more high-fiber foods, or take fiber supplements.  Stool softeners.  Techniques to stretch the opening of the anus (dilation techniques). These may be performed by a health care provider, or you may need to do them at home.  Surgery. This may be done in severe cases or if other treatments do not help.Surgerymay  involve:  Sphincterotomy. This is a surgery on a muscle in the anus.  Anoplasty. This is a surgery to reconstruct the anus. Follow these instructions at home: Diet    Make any diet changes that your health care provider recommends.  Eat foods that are high in fiber, such as fruits, vegetables, whole grains, and beans.  Drink enough fluid to keep your urine clear or pale yellow. General instructions   Take over-the-counter and prescription medicines only as told by your health care provider.  Practice good hygiene to reduce your risk of infection.  Follow instructions from your health care provider about how and when to perform any dilation techniques.  Keep all follow-up visits as told by your health care provider. This is important. Contact a health care provider if:  You have more pain or bleeding during bowel movements, even after treatment.  You have trouble having a bowel movement.  You are unable to have a bowel movement. Get help right away if:  You have a fever and your symptoms suddenly get worse.  You have black stools. This information is not intended to replace advice given to you by your health care provider. Make sure you discuss any questions you have with your health care provider. Document Released: 12/21/2012 Document Revised: 04/22/2016 Document Reviewed: 02/27/2016 Elsevier Interactive Patient Education  2017 Elsevier Inc.   -   High-Fiber Diet Fiber, also called dietary fiber, is a type of carbohydrate found in fruits, vegetables, whole grains, and beans. A high-fiber diet can have many health benefits. Your health care provider may recommend a high-fiber diet to help:  Prevent constipation. Fiber  can make your bowel movements more regular.  Lower your cholesterol.  Relieve hemorrhoids, uncomplicated diverticulosis, or irritable bowel syndrome.  Prevent overeating as part of a weight-loss plan.  Prevent heart disease, type 2 diabetes, and  certain cancers. What is my plan? The recommended daily intake of fiber includes:  38 grams for men under age 77.  60 grams for men over age 17.  63 grams for women under age 61.  20 grams for women over age 2. You can get the recommended daily intake of dietary fiber by eating a variety of fruits, vegetables, grains, and beans. Your health care provider may also recommend a fiber supplement if it is not possible to get enough fiber through your diet. What do I need to know about a high-fiber diet?  Fiber supplements have not been widely studied for their effectiveness, so it is better to get fiber through food sources.  Always check the fiber content on thenutrition facts label of any prepackaged food. Look for foods that contain at least 5 grams of fiber per serving.  Ask your dietitian if you have questions about specific foods that are related to your condition, especially if those foods are not listed in the following section.  Increase your daily fiber consumption gradually. Increasing your intake of dietary fiber too quickly may cause bloating, cramping, or gas.  Drink plenty of water. Water helps you to digest fiber. What foods can I eat? Grains  Whole-grain breads. Multigrain cereal. Oats and oatmeal. Brown rice. Barley. Bulgur wheat. [place]. Bran muffins. Popcorn. Rye wafer crackers. Vegetables  Sweet potatoes. Spinach. Kale. Artichokes. Cabbage. Broccoli. Green peas. Carrots. Squash. Fruits  Berries. Pears. Apples. Oranges. Avocados. Prunes and raisins. Dried figs. Meats and Other Protein Sources  [place], kidney, pinto, and soy beans. Split peas. Lentils. Nuts and seeds. Dairy  Fiber-fortified yogurt. Beverages  Fiber-fortified soy milk. Fiber-fortified orange juice. Other  Fiber bars. The items listed above may not be a complete list of recommended foods or beverages. Contact your dietitian for more options.  What foods are not recommended? Grains  White bread.  Pasta made with refined flour. White rice. Vegetables  Fried potatoes. Canned vegetables. Well-cooked vegetables. Fruits  Fruit juice. Cooked, strained fruit. Meats and Other Protein Sources  Fatty cuts of meat. Fried Sales executive or fried fish. Dairy  Milk. Yogurt. Cream cheese. Sour cream. Beverages  Soft drinks. Other  Cakes and pastries. Butter and oils. The items listed above may not be a complete list of foods and beverages to avoid. Contact your dietitian for more information.  What are some tips for including high-fiber foods in my diet?  Eat a wide variety of high-fiber foods.  Make sure that half of all grains consumed each day are whole grains.  Replace breads and cereals made from refined flour or white flour with whole-grain breads and cereals.  Replace white rice with brown rice, bulgur wheat, or millet.  Start the day with a breakfast that is high in fiber, such as a cereal that contains at least 5 grams of fiber per serving.  Use beans in place of meat in soups, salads, or pasta.  Eat high-fiber snacks, such as berries, raw vegetables, nuts, or popcorn. This information is not intended to replace advice given to you by your health care provider. Make sure you discuss any questions you have with your health care provider. Document Released: 08/26/2005 Document Revised: 02/01/2016 Document Reviewed: 02/08/2014 Elsevier Interactive Patient Education  2017 [employer].

## 2016-11-26 NOTE — Progress Notes (Signed)
Patient will have labs drawn when she has her port flushed.

## 2016-11-29 ENCOUNTER — Other Ambulatory Visit: Payer: Self-pay

## 2016-11-29 ENCOUNTER — Telehealth: Payer: Self-pay

## 2016-11-29 NOTE — Telephone Encounter (Signed)
Call received from the patient stating that she has not heard anything about her CPAP supplies.  This CM placed a call to [facility] # [phone] and spoke to [place] to inquire why the patient has not been contacted yet about her supply delivery. She stated that the patient should have called them instead of calling [facility] to discuss supply delivery.  She stated that they will be in the office until 1800 and the patient should call the office now.   Call placed to the patient and instructed her to call the [facility] office now and she said that she would. She had the correct phone # for their office.  She also wanted to ask Dr Janne Napoleon if she can have a second pneumonia vaccine. Informed her the the message would be routed to Dr Janne Napoleon.

## 2016-11-29 NOTE — Telephone Encounter (Signed)
Message received from the patient requesting a call back.   Call returned to the patient. She said that she has not heard from [facility] yet about her CPAP supplies. She said she could be contacted at home all afternoon.   Call placed to [facility] # [phone] and spoke to [name]. Explained to her that the patient desperately needs her CPAP supplies. [name] stated that they attempted to contact the patient on 11/26/16 but were not successful.  This CM then confirmed the patient's phone #s and [name] noted that they did not have the correct phone #s despite the fact that the correct numbers were sent to them when the order was placed. She stated that they must not have updated their files.  Requested that they attempt to contact the patient today as she is at home all afternoon and she said she would forward the information to the person who would be making the call.   Call placed to the patient and informed her that she should be receiving a call from the company to discuss delivery of her supplies. She was very appreciative and stated that she would let this CM know when she hears from the company.

## 2016-11-29 NOTE — Telephone Encounter (Signed)
Call placed to the patient's home to inquire if she has received her CPAP supplies from [facility]. A message was left with her husband requesting her to call this CM back at # [phone]

## 2016-11-30 ENCOUNTER — Other Ambulatory Visit: Payer: Self-pay | Admitting: Internal Medicine

## 2016-12-02 ENCOUNTER — Telehealth: Payer: Self-pay

## 2016-12-02 NOTE — Telephone Encounter (Addendum)
Attempted to contact the patient to inquire if she spoke to [facility] Patient about her CPAP supplies. Call placed to # [phone] (H) and her husband answered and took a message for patient to return the call to this CM    Call received from the patient. She said that she did speak to [facility] and they told her that they would be sending her the supplies.  She stated that she is very anxious to receive them . Instructed her to call them back tomorrow if she has not received the supplies Also instructed her to inquire if she is able to pick the supplies up in their office.   Informed her that Dr Janne Napoleon also stated that she can come in at any time to have the pneumonia vaccine.  She said that she would call back at a later time to schedule an appointment.

## 2016-12-03 ENCOUNTER — Telehealth: Payer: Self-pay | Admitting: Internal Medicine

## 2016-12-03 ENCOUNTER — Telehealth: Payer: Self-pay

## 2016-12-03 ENCOUNTER — Encounter: Payer: Self-pay | Admitting: Internal Medicine

## 2016-12-03 ENCOUNTER — Ambulatory Visit (HOSPITAL_COMMUNITY): Payer: Self-pay | Admitting: Psychiatry

## 2016-12-03 ENCOUNTER — Telehealth (HOSPITAL_COMMUNITY): Payer: Self-pay

## 2016-12-03 NOTE — Telephone Encounter (Signed)
Copy of sleep study received and call placed to the patient to notify her .  Inquired if she has a preference for DME companies, [facility] or [facility] and she said that she has no preference and was agreeable to having the prescription for supplies sent to [facility].  She also noted that she is waiting for a call back from Dr Bensimhon's office but understands that she is to call 911 if necessary in the meantime.    New prescription for CPAP supplies, demographics, sleep study report and office note faxed to [facility] .

## 2016-12-03 NOTE — Patient Outreach (Signed)
[facility]) Care Management  Late entry for 11/29/16  Teonna Coonan [birth year] [phone]  RNCM attempted to reach patient for follow up. Spoke with patient briefly who stated that she had someone at her home and could not talk right now. She reported that she would callback at another time.  [name] Arland Usery, RN, BSN, [place] Management Coordinator [phone]

## 2016-12-03 NOTE — Telephone Encounter (Signed)
Call placed to [facility] to confirm receipt of the order for CPAP supplies.  Spoke to to [employer] who stated that they do not have a record of receiving the fax despite the transmittal log noting that the fax successfully was sent.  The CPAP order and accompanying documentation was re faxed to [facility] - fax # [phone].

## 2016-12-03 NOTE — Telephone Encounter (Signed)
Patient's husband came by the office to drop off document that patient needs PCP to fill out. Placing document in PCP's mailbox. Please contact pt when it's ready for pick up.  Thank you.

## 2016-12-03 NOTE — Telephone Encounter (Signed)
Call received from the patient stating that she received a call from [facility] informing her that her insurance is out of network. Informed her that this CM would follow up about the CPAP supply status  She then noted that she is feeling dizzy when laying down or sitting up and she is not sure that it is due to her new medication. She said that she was going to call Dr Haroldine Laws now.   Call placed to [facility], RN/THN # [phone] to provide her an update on the status of CPAP supplies and the patient's complaint of dizziness. Voicemail message left requesting a call back to # [phone].   Call placed to [facility] to inquire if they accept [employer]. Spoke to [place] who stated that they accept some [employer] plans but a prescription for supplies with demographic information as well as a copy of the sleep study be faxed to # [phone] for review and confirmation that insurance is in network.  Call placed to [facility] # [phone] to inquire if they accept [employer].  Spoke to Manuela Schwartz in customer service who stated that a prescription with supplies along with the demographics and a copy of the sleep study needs to be faxed to # [phone] for review.  Call received from [place], [place] Margie Billet.  Updated provided to her on the status of CPAP supplies with [facility] as well as the patient's complaint of dizziness and her statement that she was going to call Dr Haroldine Laws.     Call placed to [facility].  Spoke to [facility] regarding the call received from the patient stating that she was informed that [facility] is not in network with [employer].  [place] CM explained that this referral for supplies was initiated in 09/2016 and inquired why it has taken over 2 months to determine this information.  Myrtis Hopping explained that their company was purchased by [employer] and the staff has been backlogged with orders.  She stated that no work was done on this order until  11/29/16 and the information was not submitted to the insurance company until 12/02/16.  This CM explained that this is unacceptable as the patient has been in need of these supplies.  Myrtis Hopping stated that she would have a manager contact this CM.   This CM requested a copy of the sleep study. Myrtis Hopping said she would fax it to # [phone] at [facility].  She noted that the sleep study was done on 10/07/2013   Call placed to the patient and provided her with an update on the attempt to order her supplies. Encouraged her to also contact [facility] to request that a copy of her sleep study be faxed to [facility] at the above noted fax number in attempt to insure that the fax will be received today.  She said that she would call and make the request.  She also noted that she left a message for Dr Haroldine Laws and is waiting for a call back.

## 2016-12-03 NOTE — Telephone Encounter (Signed)
   Please set up follow up with Dr Aundra Dubin  Thanks   Amy  Amy Clegg NP-C   3:25 PM

## 2016-12-03 NOTE — Telephone Encounter (Signed)
Will forward to pcp

## 2016-12-03 NOTE — Telephone Encounter (Signed)
Patient reports increased dizziness sitting, standing, and laying down.  Also increased fatigue.  States she recently started adempas but doesn't think this is related to her symptom etiology. States she has been taking her extra PRN lasix daily (80 mg total each day) because her feet have been swelling to the point that she is unable to put on her shoes. BP yesterday 88/72, BP today 102/82. Offered 3 pm apt today to be evaluated, patient states she is unable to come as her husband is not home to drive her. Will forward to [place] NP-C to further advise.  Instructed patient to call 911 if she declines symptomatically until I can call her back with further instructions as she is unable to drive safely. Patient aware and agreeable.  Renee Pain, RN

## 2016-12-03 NOTE — Telephone Encounter (Signed)
Will schedule next available with DM

## 2016-12-03 NOTE — Progress Notes (Signed)
Records sent from Dr. Marca Ancona Hutchinson DO, [place], [place] - reviewed Will scan reports.  pshx. bilat breast reduction Partial Hysterectomy Left knee arthroscopy Right ankle surgery -  ortho. Surgery Tubal ligation ccy Port placement 07/31/15 egd w/ bx 08/15/14 [facility] system; noted gastritis and [place] Colonoscopy - 08/15/14 [facility], NO abnormalities noted, recd high fiber diet, repeat cscope 8-10 years. Hx of hemorrhoidectomy 09/22/14 Hx of colitis  surg path/  08/15/14 Stomach - no path dx eso - no path dx  04/04/15 Antrum bx/ sm. Bowel - benign polpy  09/23/14 Rectum, hemorrhoidectomy - cw hemorrhoids

## 2016-12-04 ENCOUNTER — Ambulatory Visit (INDEPENDENT_AMBULATORY_CARE_PROVIDER_SITE_OTHER): Payer: Medicare HMO | Admitting: Psychiatry

## 2016-12-04 ENCOUNTER — Encounter (HOSPITAL_COMMUNITY): Payer: Self-pay | Admitting: Psychiatry

## 2016-12-04 DIAGNOSIS — Z813 Family history of other psychoactive substance abuse and dependence: Secondary | ICD-10-CM

## 2016-12-04 DIAGNOSIS — F330 Major depressive disorder, recurrent, mild: Secondary | ICD-10-CM

## 2016-12-04 DIAGNOSIS — Z818 Family history of other mental and behavioral disorders: Secondary | ICD-10-CM

## 2016-12-04 DIAGNOSIS — Z810 Family history of intellectual disabilities: Secondary | ICD-10-CM | POA: Diagnosis not present

## 2016-12-04 DIAGNOSIS — F419 Anxiety disorder, unspecified: Secondary | ICD-10-CM | POA: Diagnosis not present

## 2016-12-04 MED ORDER — BUPROPION HCL ER (XL) 300 MG PO TB24: 300.0000 mg | ORAL_TABLET | ORAL | 0 refills | Status: DC

## 2016-12-04 MED ORDER — TRAZODONE HCL 100 MG PO TABS: 100.0000 mg | ORAL_TABLET | Freq: Every evening | ORAL | 0 refills | Status: DC | PRN

## 2016-12-04 NOTE — Progress Notes (Signed)
BH MD/PA/NP OP Progress Note  12/04/2016 10:26 AM [name] [name]  MRN:  [id number]  Chief Complaint:  Chief Complaint    Follow-up     Subjective:  I still feel anxious and depressed.  I'm not sleeping as good.  HPI: [name] [name] came for her follow-up appointment.  She was seen for weeks ago as initial evaluation.  She was complaining about depression, anxiety, lack of motivation, lack of interest and sadness.  Patient has multiple health issues.  We started her on Wellbutrin and trazodone and recommended to stop the amitriptyline and Cymbalta because it was not helping him.  Though she is taking Wellbutrin 150 mg in the morning and do not see a huge improvement she denies any side effects.  She admitted there are a lot of other issues going on in her life that may be contributing to her depression.  She is taking multiple medication for her health.  Recently her 22-year-old nephew was admitted at behavioral [facility] because of mental illness.  Patient has legal custody.  Patient continues to feel tired, lack of energy, poor attention and concentration but denies any crying spells or any suicidal thoughts.  She sleeping 3-4 hours.  She likes trazodone but feel that dose is not strong.  Recently her cardiologist started a new medication for her heart and she still adjusting with that.  She is very concerned about her health and she was told she may require heart transplant if her condition do not improve.  Patient denies any tremors, shakes, rash or any itching.  She lives with her husband who is supportive.  Patient also started Eloise Levels for counseling.  She really liked her and like to continue treatment.  Patient denies any hallucination, paranoia, self abusive behavior.  Her appetite is fair.  Her energy level is okay.  Patient denies drinking alcohol or using any illegal substances.  Visit Diagnosis: No diagnosis found.  Past Psychiatric History: Patient remember started depression when she  lost her baby who was 55 days old.  She took psychiatric medication for little while and then did very well for 10 years without medication.  She remembered taking Prozac, Zoloft when she was living in [place].  She was also given Celexa, Cymbalta, amitriptyline, Neurontin and hydroxyzine by her primary care physician with limited response.  Patient denies any psychiatric inpatient treatment or any suicidal attempt.  Patient denies any history of psychosis or any hallucination.  Past Medical History:  Past Medical History:  Diagnosis Date  . Arthritis   . Depression   . Diverticulitis   . Hypertension   . Obesity   . Pneumonia   . Pulmonary embolism [facility])     Past Surgical History:  Procedure Laterality Date  . ABDOMINAL HYSTERECTOMY    . BREAST REDUCTION SURGERY    . CHOLECYSTECTOMY    . EMBOLECTOMY N/A    pulmonary embolectomy  . RIGHT HEART CATH N/A 10/28/2016   Procedure: Right Heart Cath;  Surgeon: Jolaine Artist, MD;  Location: [place] CV LAB;  Service: Cardiovascular;  Laterality: N/A;    Family Psychiatric History: Reviewed.  Family History:  Family History  Problem Relation Age of Onset  . Lung cancer Maternal Grandmother   . Dementia Mother   . Dementia Father   . Anxiety disorder Sister   . Bipolar disorder Sister   . Drug abuse Sister   . Sexual abuse Maternal Aunt   . Anxiety disorder Cousin   . Anxiety disorder Sister   .  Bipolar disorder Sister   . Drug abuse Sister     Social History:  Social History   Social History  . Marital status: Married    Spouse name: N/A  . Number of children: N/A  . Years of education: N/A   Social History Main Topics  . Smoking status: Never Smoker  . Smokeless tobacco: Never Used  . Alcohol use No  . Drug use: No  . Sexual activity: Yes    Partners: Male    Birth control/ protection: Surgical   Other Topics Concern  . Not on file   Social History Narrative  . No narrative on file     Allergies: No Known Allergies  Metabolic Disorder Labs: Lab Results  Component Value Date   HGBA1C 5.6 06/27/2016   MPG 114 06/27/2016   No results found for: PROLACTIN Lab Results  Component Value Date   CHOL 166 06/27/2016   TRIG 125 06/27/2016   HDL 33 (L) 06/27/2016   CHOLHDL 5.0 06/27/2016   VLDL 25 06/27/2016   LDLCALC 108 (H) 06/27/2016     Current Medications: Current Outpatient Prescriptions  Medication Sig Dispense Refill  . acetaminophen-codeine (TYLENOL #3) 300-30 MG tablet Take 1 tablet by mouth every 8 (eight) hours as needed for moderate pain. Max total Tyelnol < 2000mg  /day 50 tablet 0  . albuterol (PROVENTIL HFA;VENTOLIN HFA) 108 (90 Base) MCG/ACT inhaler Inhale 1-2 puffs into the lungs every 6 (six) hours as needed for wheezing or shortness of breath. (Patient not taking: Reported on 11/21/2016) 6.7 g 5  . amLODipine (NORVASC) 5 MG tablet Take 1 tablet (5 mg total) by mouth daily. 90 tablet 3  . atorvastatin (LIPITOR) 20 MG tablet Take 1 tablet (20 mg total) by mouth daily at 6 PM. 90 tablet 2  . buPROPion (WELLBUTRIN XL) 150 MG 24 hr tablet Take 1 tablet (150 mg total) by mouth every morning. (Patient not taking: Reported on 11/08/2016) 30 tablet 0  . diclofenac (FLECTOR) 1.3 % PTCH Place 1 patch onto the skin 2 (two) times daily. 60 patch 6  . diclofenac sodium (VOLTAREN) 1 % GEL Apply 2 g topically 4 (four) times daily. 100 g 2  . dicyclomine (BENTYL) 20 MG tablet Take 1 tablet (20 mg total) by mouth 2 (two) times daily. 30 tablet 0  . docusate sodium (COLACE) 100 MG capsule Take 1 capsule (100 mg total) by mouth every 12 (twelve) hours. 30 capsule 0  . furosemide (LASIX) 40 MG tablet Take 1 tablet (40 mg total) by mouth daily. Take extra 40 mg tablet once in the afternoon AS NEEDED for weight gain 3 lbs or more. 60 tablet 3  . gabapentin (NEURONTIN) 300 MG capsule Take 1 capsule (300 mg total) by mouth 3 (three) times daily. 90 capsule 1  . hydrocortisone  (ANUSOL-HC) 25 MG suppository Place 1 suppository (25 mg total) rectally 2 (two) times daily. 24 suppository 0  . meclizine (ANTIVERT) 25 MG tablet Take 25 mg by mouth 3 (three) times daily as needed for dizziness.    . metoprolol tartrate (LOPRESSOR) 25 MG tablet Take 1 tablet (25 mg total) by mouth 2 (two) times daily. 180 tablet 3  . potassium chloride (K-DUR) 10 MEQ tablet Take 1 tablet (10 mEq total) by mouth 2 (two) times daily. 30 tablet 2  . Riociguat (ADEMPAS) 1 MG TABS Take 1 mg by mouth 3 (three) times daily. (Patient not taking: Reported on 11/21/2016) 90 tablet 11  . rivaroxaban (XARELTO) 20  MG TABS tablet Take 1 tablet (20 mg total) by mouth daily with supper. 90 tablet 3  . sodium chloride (OCEAN) 0.65 % SOLN nasal spray Place 1 spray into both nostrils as needed for congestion. (Patient not taking: Reported on 11/21/2016) 1 Bottle 11  . traZODone (DESYREL) 50 MG tablet Take 1 tablet (50 mg total) by mouth at bedtime as needed for sleep. (Patient not taking: Reported on 11/21/2016) 30 tablet 0  . triamcinolone cream (KENALOG) 0.1 % Apply 1 application topically 2 (two) times daily. For rash on left leg (Patient taking differently: Apply 1 application topically 2 (two) times daily as needed (for rash.). ) 30 g 0   No current facility-administered medications for this visit.     Neurologic: Headache: Yes Seizure: No Paresthesias: No  Musculoskeletal: Strength & Muscle Tone: decreased Gait & Station: unsteady Patient leans: Left and Front  Psychiatric Specialty Exam: Review of Systems  Constitutional: Positive for malaise/fatigue.  HENT: Negative.   Cardiovascular: Positive for chest pain.  Genitourinary: Negative.   Musculoskeletal: Positive for back pain and joint pain.  Skin: Negative.   Psychiatric/Behavioral: Positive for depression. The patient is nervous/anxious.     Blood pressure 136/82, pulse 94, height 5\' 5"  (1.651 m), weight (!) 324 lb 8 oz (147.2 kg).There is no  height or weight on file to calculate BMI.  General Appearance: Tired  Eye Contact:  Good  Speech:  Slow  Volume:  Normal  Mood:  Anxious  Affect:  Congruent  Thought Process:  Goal Directed  Orientation:  Full (Time, Place, and Person)  Thought Content: Rumination   Suicidal Thoughts:  No  Homicidal Thoughts:  No  Memory:  Immediate;   Fair Recent;   Fair Remote;   Good  Judgement:  Good  Insight:  Good  Psychomotor Activity:  Decreased  Concentration:  Concentration: Fair and Attention Span: Fair  Recall:  Good  Fund of Knowledge: Good  Language: Good  Akathisia:  No  Handed:  Right  AIMS (if indicated):  0  Assets:  Communication Skills Desire for Improvement Housing Resilience Social Support  ADL's:  Intact  Cognition: WNL  Sleep:  poor   Assessment: Major depressive disorder, recurrent.  Anxiety disorder NOS.  Plan: I review her psychosocial stressors, current medication.  She is no longer taking Cymbalta and amitriptyline.  She do not have any side effects from Wellbutrin.  I recommended to increase Wellbutrin 300 mg to help the depression and increase trazodone 100 mg to help insomnia.  Patient is taking multiple medication for her health needs and we discuss medication side effects and interactions.  I do believe patient requires counseling and she started atopy with Eloise Levels.  We also discussed intensive outpatient program and she has information and she will explore further.  Encouraged to call us back if she does not feel any improvement or feel worsening of the symptom.  Discuss safety plan that anytime having active suicidal thoughts or homicidal thought that she need to call 911 or go to the local emergency room.  Follow-up in 4 weeks.  Nethan Caudillo T., MD 12/04/2016, 10:26 AM

## 2016-12-05 ENCOUNTER — Telehealth: Payer: Self-pay

## 2016-12-05 NOTE — Telephone Encounter (Signed)
Call placed to [facility] to check on the status of the referral for CPAP supplies. Spoke to [place] with CPAP, who stated that they have received all information needed and have cleared the insurance. He noted that they sent a ' ticket" to respiratory therapy who will then call the patient to schedule a time to pick up supplies and be fit for the mask.   Attempted to contact the patient to provide her with an update on the status of the supplies.  Call placed to # [phone] (H) and this CM left a message with her grandson requesting that she return the call to [facility]

## 2016-12-05 NOTE — Telephone Encounter (Signed)
As per Ebbie Latus, CMA, she spoke to [facility] at [facility] on 12/04/16 and the referral is in process.

## 2016-12-05 NOTE — Telephone Encounter (Signed)
Call received from the patient.  Informed her of the status of the order for the CPAP supplies and provided her with the # for [facility].   She was very appreciative of the update.   She also said that she is still feeling dizzy and fatigued and she has not heard from the cardiologist yet. Encouraged her to call cardiology again and to call 911 if medically necessary . She said that she understood and would call cardiology now and she also noted that she understands when to go to ED.

## 2016-12-06 NOTE — Telephone Encounter (Signed)
Verified with Amy that patient needs appt-next available with Dr. Haroldine Laws.  Scheduled appt next Tuesday, patient aware.

## 2016-12-09 ENCOUNTER — Telehealth: Payer: Self-pay

## 2016-12-09 ENCOUNTER — Other Ambulatory Visit: Payer: Self-pay | Admitting: Internal Medicine

## 2016-12-09 NOTE — Telephone Encounter (Signed)
Message received from the patient requesting a call back.  Call placed to the patient and she said that she called [facility] and scheduled an appointment for 12/11/16 for a mask fitting. She said that she was instructed to bring her CPAP machine with her and after this meeting, they would begin to deliver the supplies.

## 2016-12-10 ENCOUNTER — Ambulatory Visit (HOSPITAL_COMMUNITY)
Admission: RE | Admit: 2016-12-10 | Discharge: 2016-12-10 | Disposition: A | Discharge status: Home / Self Care | Payer: Medicare HMO | Source: Ambulatory Visit | Attending: Internal Medicine | Admitting: Internal Medicine

## 2016-12-10 ENCOUNTER — Encounter (HOSPITAL_COMMUNITY): Payer: Self-pay | Admitting: Internal Medicine

## 2016-12-10 VITALS — BP 102/66 | HR 123 | Wt 322.2 lb

## 2016-12-10 DIAGNOSIS — I2724 Chronic thromboembolic pulmonary hypertension: Secondary | ICD-10-CM

## 2016-12-10 DIAGNOSIS — F329 Major depressive disorder, single episode, unspecified: Secondary | ICD-10-CM | POA: Diagnosis not present

## 2016-12-10 DIAGNOSIS — R079 Chest pain, unspecified: Secondary | ICD-10-CM | POA: Insufficient documentation

## 2016-12-10 DIAGNOSIS — I10 Essential (primary) hypertension: Secondary | ICD-10-CM | POA: Diagnosis not present

## 2016-12-10 DIAGNOSIS — M069 Rheumatoid arthritis, unspecified: Secondary | ICD-10-CM | POA: Diagnosis not present

## 2016-12-10 DIAGNOSIS — Z7901 Long term (current) use of anticoagulants: Secondary | ICD-10-CM | POA: Diagnosis not present

## 2016-12-10 DIAGNOSIS — J40 Bronchitis, not specified as acute or chronic: Secondary | ICD-10-CM

## 2016-12-10 DIAGNOSIS — R42 Dizziness and giddiness: Secondary | ICD-10-CM | POA: Diagnosis not present

## 2016-12-10 DIAGNOSIS — F419 Anxiety disorder, unspecified: Secondary | ICD-10-CM | POA: Diagnosis not present

## 2016-12-10 DIAGNOSIS — I272 Pulmonary hypertension, unspecified: Secondary | ICD-10-CM | POA: Diagnosis not present

## 2016-12-10 DIAGNOSIS — R05 Cough: Secondary | ICD-10-CM | POA: Diagnosis not present

## 2016-12-10 DIAGNOSIS — M797 Fibromyalgia: Secondary | ICD-10-CM | POA: Insufficient documentation

## 2016-12-10 DIAGNOSIS — G4733 Obstructive sleep apnea (adult) (pediatric): Secondary | ICD-10-CM | POA: Diagnosis not present

## 2016-12-10 DIAGNOSIS — G8929 Other chronic pain: Secondary | ICD-10-CM | POA: Diagnosis not present

## 2016-12-10 DIAGNOSIS — J069 Acute upper respiratory infection, unspecified: Secondary | ICD-10-CM | POA: Diagnosis not present

## 2016-12-10 DIAGNOSIS — R0789 Other chest pain: Secondary | ICD-10-CM | POA: Diagnosis not present

## 2016-12-10 DIAGNOSIS — Z86711 Personal history of pulmonary embolism: Secondary | ICD-10-CM | POA: Insufficient documentation

## 2016-12-10 MED ORDER — MECLIZINE HCL 25 MG PO TABS: 25.0000 mg | ORAL_TABLET | Freq: Three times a day (TID) | ORAL | 3 refills | Status: DC | PRN

## 2016-12-10 NOTE — Progress Notes (Signed)
ADVANCED HF CLINIC NOTE  Date:  12/10/2016   ID:  [name] [name], DOB 1961/02/16, MRN [id number]  PCP:  [name] Reamer, MD  HF MD: Dr [name] [name]    HPI: 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   Patient was in her [place] until 08/2015 when she started getting symptoms of dyspnea on exertion, dizziness, bilateral LE swelling, and constant R>L non-specific chest pain. These symptoms progressively worsened over several days prompting her to go to [facility] in her then-hometown of [place] in 09/2015 where she was diagnosed with PE and started on anticoagulation. She was very briefly on warfarin but quickly switched to Francetta Found was referred to [facility] in 12/2015. Patient refused LHC because of financial fears and unclear insurance status. Echocardiogram (12-2015) showed normal LVEF 55-60%, normal LA, normal RV size, RVSP estimated as 70 mm Hg. She further was referred to [facility] and saw Dr. Karena [name] on 03/14/2016 where patient had several studies including V/Q scan which found high prob for PE; studies c/w CTEPH. It was felt that her chronic chest pain was caused by chronic PE. RHC/LHC/Pulm Angiogram 03/2016 confirmed diagnosis of CTEPH. She underwent pulmonary thromboembolectomy 03/31/2016 at [facility] with IVC filter placement. Pre-op cath with no CAD.   Seen by Dr. Karena [name] in 04/2016 for follow up. He felt like her heart size was decreasing. Plan was for lifelong [place] and ECHO and V/Q in 4 months. Dr. Karena [name] has since left the [facility] practice.  CT angio at [facility] on 05/15/16 showed " persistent but partially recanalized PE within right lower lobe pulmonary artery. No new PE identified. "  She was admitted to [facility] in 06/2016 for continued chest pain and parasthesias. V/Q scan on 06/27/16 showed  prominent ventilation perfusion mismatch in right c/w chronic PE. Head CT negative. She was continued on Xarelto and told to follow up with [facility]. Patient doesn't want to have to travel to [facility] for care so [facility] care here in [place].   Saw [name] [name] recently for ongoing CP. Referred here for further management of CP and PAH. Echo 10/07/16. EF 55%. RV normal RVSP 14mmHG  Today she returns for add-on visit due to persistent dizziness and fatigue. Since last visit has been started on Adempas. Dizziness and fatigue preceded Adempas initiation.  Sleep study also notable for OSA. She says dizziness is completely different. Says when she closes her eyes the room spins. Also has severe cough and sinus congestion. No fever or chills. Has yellow drainage from her nose. No fevers or chills.   10/2016 RHC RA = 8 RV = 64/13 PA = 65/19 (35) PCW = 9 Fick cardiac output/index = 7.5/3.1 PVR = 3.5 WU Ao sat = 96% PA sat = 68%,70% SVC sat = 71% Assessment: 1. Mild residual PAH in the setting of CTEPH s/p pulmonary thrombolectomy 2. Normal cardiac output 3. Normal left-sided filling pressures  Past Medical History:  Diagnosis Date  . Arthritis   . Depression   . Diverticulitis   . Hypertension   . Obesity   . Pneumonia   . Pulmonary embolism [facility])     Past Surgical History:  Procedure Laterality Date  . ABDOMINAL HYSTERECTOMY    . BREAST REDUCTION SURGERY    . CHOLECYSTECTOMY    . EMBOLECTOMY N/A  pulmonary embolectomy  . RIGHT HEART CATH N/A 10/28/2016   Procedure: Right Heart Cath;  Surgeon: [name] Artist, MD;  Location: [place] CV LAB;  Service: Cardiovascular;  Laterality: N/A;    Current Medications:  Current Outpatient Prescriptions on File Prior to Encounter  Medication Sig Dispense Refill  . acetaminophen-codeine (TYLENOL #3) 300-30 MG tablet Take 1 tablet by mouth every 8 (eight) hours as needed for moderate pain. Max total Tyelnol < 2000mg  /day 50  tablet 0  . albuterol (PROVENTIL HFA;VENTOLIN HFA) 108 (90 Base) MCG/ACT inhaler Inhale 1-2 puffs into the lungs every 6 (six) hours as needed for wheezing or shortness of breath. (Patient not taking: Reported on 11/21/2016) 6.7 g 5  . amLODipine (NORVASC) 5 MG tablet Take 1 tablet (5 mg total) by mouth daily. 90 tablet 3  . atorvastatin (LIPITOR) 20 MG tablet Take 1 tablet (20 mg total) by mouth daily at 6 PM. 90 tablet 2  . buPROPion (WELLBUTRIN XL) 300 MG 24 hr tablet Take 1 tablet (300 mg total) by mouth every morning. 30 tablet 0  . diclofenac (FLECTOR) 1.3 % PTCH Place 1 patch onto the skin 2 (two) times daily. 60 patch 6  . diclofenac sodium (VOLTAREN) 1 % GEL Apply 2 g topically 4 (four) times daily. 100 g 2  . dicyclomine (BENTYL) 20 MG tablet Take 1 tablet (20 mg total) by mouth 2 (two) times daily. 30 tablet 0  . docusate sodium (COLACE) 100 MG capsule Take 1 capsule (100 mg total) by mouth every 12 (twelve) hours. 30 capsule 0  . furosemide (LASIX) 40 MG tablet Take 1 tablet (40 mg total) by mouth daily. Take extra 40 mg tablet once in the afternoon AS NEEDED for weight gain 3 lbs or more. 60 tablet 3  . gabapentin (NEURONTIN) 300 MG capsule Take 1 capsule (300 mg total) by mouth 3 (three) times daily. 90 capsule 1  . hydrocortisone (ANUSOL-HC) 25 MG suppository Place 1 suppository (25 mg total) rectally 2 (two) times daily. 24 suppository 0  . meclizine (ANTIVERT) 25 MG tablet Take 25 mg by mouth 3 (three) times daily as needed for dizziness.    . metoprolol tartrate (LOPRESSOR) 25 MG tablet Take 1 tablet (25 mg total) by mouth 2 (two) times daily. 180 tablet 3  . potassium chloride (K-DUR) 10 MEQ tablet Take 1 tablet (10 mEq total) by mouth 2 (two) times daily. 30 tablet 2  . Riociguat (ADEMPAS) 1 MG TABS Take 1 mg by mouth 3 (three) times daily. (Patient not taking: Reported on 11/21/2016) 90 tablet 11  . rivaroxaban (XARELTO) 20 MG TABS tablet Take 1 tablet (20 mg total) by mouth daily  with supper. 90 tablet 3  . sodium chloride (OCEAN) 0.65 % SOLN nasal spray Place 1 spray into both nostrils as needed for congestion. (Patient not taking: Reported on 11/21/2016) 1 Bottle 11  . traZODone (DESYREL) 100 MG tablet Take 1 tablet (100 mg total) by mouth at bedtime as needed for sleep. 30 tablet 0  . triamcinolone cream (KENALOG) 0.1 % Apply 1 application topically 2 (two) times daily. For rash on left leg (Patient taking differently: Apply 1 application topically 2 (two) times daily as needed (for rash.). ) 30 g 0   No current facility-administered medications on file prior to encounter.     Allergies:   [name].   Social History   Social History  . Marital status: Married    Spouse name: N/A  .  Number of children: N/A  . Years of education: N/A   Social History Main Topics  . Smoking status: Never Smoker  . Smokeless tobacco: Never Used  . Alcohol use No  . Drug use: No  . Sexual activity: Yes    Partners: Male    Birth control/ protection: Surgical   Other Topics Concern  . Not on file   Social History Narrative  . No narrative on file     Family History:  The patient's family history includes Anxiety disorder in her cousin, sister, and sister; Bipolar disorder in her sister and sister; Dementia in her father and mother; Drug abuse in her sister and sister; Lung cancer in her maternal grandmother; Sexual abuse in her maternal aunt.      ROS:   Please see the history of present illness.    ROS All other systems reviewed and are negative.   PHYSICAL EXAM:   VS:  BP 102/66   Pulse (!) 123   Wt (!) 322 lb 4 oz (146.2 kg)   SpO2 97%   BMI 53.63 kg/m    GEN: Obese woman. + nasal congestion. Walked into clinic. Husband present HEENT: normal  Anicteric. No sinus tenderness. Oropharynx clear Neck: neck thick. no obvious JVD carotids 2+  No bruits Cardiac: PMI non palpable  RRR; no murmurs, rubs, or gallops. No edema Respiratory:  Decreased throughout  No wheeze GI: markedly obese. Soft NT. ND  Good bs MS: no deformity or atrophy  Skin: warm and dry, no rash Neuro:  Alert and Oriented x 3, Strength and sensation are intact no nystagmus  Psych: anxious  Wt Readings from Last 3 Encounters:  11/26/16 (!) 321 lb 12.8 oz (146 kg)  11/21/16 (!) 321 lb 3.2 oz (145.7 kg)  11/15/16 (!) 319 lb 9.6 oz (145 kg)      Studies/Labs Reviewed:    Recent Labs: 03/20/2016: B Natriuretic Peptide 88.3 10/24/2016: ALT 9; BUN 11; Creatinine, Ser 1.04; Hemoglobin 11.5; Platelets 257; Potassium 4.1; Sodium 142   Lipid Panel    Component Value Date/Time   CHOL 166 06/27/2016 0346   TRIG 125 06/27/2016 0346   HDL 33 (L) 06/27/2016 0346   CHOLHDL 5.0 06/27/2016 0346   VLDL 25 06/27/2016 0346   LDLCALC 108 (H) 06/27/2016 0346    Additional studies/ records that were reviewed today include:   2D ECHO: 12/15/2015 LV EF: 55% -   60% Study Conclusions - Left ventricle: The cavity size was normal. Wall thickness was normal. Systolic function was normal. The estimated ejection  fraction was in the [name] of 55% to 60%. - Pulmonary arteries: PA peak pressure: 70 mm Hg (S). - Pericardium, extracardiac: A trivial pericardial effusion was   identified.   2D ECHO 04/05/16 INTERPRETATION:NORMAL LEFT VENTRICULAR FUNCTION WITH MILD LVHSEVERE RV SYSTOLIC DYSFUNCTION (See above) VALVULAR REGURGITATION: TRIVIAL PR, MILD TR NO VALVULAR STENOSIS TRIVIAL PERICARDIAL EFFUSION Compared with prior Echo study on 03/14/2016: RVSP increased from 50 to 69   CATH right heart and coronary angiography: 03/25/2016 [place] Component Name Value Ref [name]  Cardiac Index (l/min/m2) 2.3 L/min/m2  Right Atrium Mean Pressure (mmHg) 11 mmHg   Right Ventricle Systolic Pressure (mmHg) 68 mmHg   Pulmonary Artery Mean Pressure (mmHg) 40 mmHg   Pulmonary Wedge Pressure (mmHg) 14 mmHg   Pulmonary Vascular Resistance (Wood units) 4.7     CT angio 05/15/16 IMPRESSION: Persistent but partially recanalized pulmonary embolus within the right lower lobe pulmonary artery. No  new pulmonary embolism is identified. Minimal bibasilar atelectatic changes stable from the prior exam.   V/Q scan 06/27/16 IMPRESSION: Prominent ventilation perfusion mismatch noted on the right. Finding suggests high probability right-sided pulmonary embolus in this patient with known right-sided pulmonary embolus.   ASSESSMENT & PLAN:   1. Dizziness --this has been chronic problem for her but she says current dizziness is much different. "Room spinning". Suspect may have vertigo in setting of viral URI/bronchitis --try melizine 2. URI --likely viral. CXR reviewed personally and it is clear --supportive care. She will f/u with PCP if not improving by Friday 3. Chronic chest pain:  --LHC performed at [facility] 7/17 - no CAD --More stable today. Has some pleuritic pain with cough 4. 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  --Echo with RVPS 79mmHG. RHC with mild to moderate PAH.  --Now on Adempas. Tolerating well. Will titrate as tolerated. 5. HTN:  --Blood pressure well controlled. Continue current regimen.  4. Fibromyalgia/Rheumatoid Arthitis:  - Follows with rheum .  5. Morbid obesity with OHS:  - Continue to pursue bariatric surgery.  6. OSA - stressed need to get supplies to restart CPAP  Seraphim Trow MD 11:29 AM

## 2016-12-10 NOTE — Patient Instructions (Signed)
Chest xray has been ordered for you.  Antivert 25 mg every 8 hours as needed for dizziness.  Follow up with your primary doctor as soon as possible.    Follow up in 3 months.

## 2016-12-11 ENCOUNTER — Ambulatory Visit: Payer: Medicare HMO | Attending: Internal Medicine | Admitting: Internal Medicine

## 2016-12-11 ENCOUNTER — Encounter: Payer: Self-pay | Admitting: Internal Medicine

## 2016-12-11 VITALS — BP 106/68 | HR 112 | Temp 98.6°F | Resp 18 | Ht 65.0 in | Wt 324.2 lb

## 2016-12-11 DIAGNOSIS — R05 Cough: Secondary | ICD-10-CM | POA: Diagnosis not present

## 2016-12-11 DIAGNOSIS — R079 Chest pain, unspecified: Secondary | ICD-10-CM | POA: Insufficient documentation

## 2016-12-11 DIAGNOSIS — G4733 Obstructive sleep apnea (adult) (pediatric): Secondary | ICD-10-CM | POA: Diagnosis not present

## 2016-12-11 DIAGNOSIS — J069 Acute upper respiratory infection, unspecified: Secondary | ICD-10-CM | POA: Diagnosis not present

## 2016-12-11 DIAGNOSIS — R000 Tachycardia, unspecified: Secondary | ICD-10-CM | POA: Diagnosis not present

## 2016-12-11 DIAGNOSIS — G894 Chronic pain syndrome: Secondary | ICD-10-CM | POA: Diagnosis not present

## 2016-12-11 DIAGNOSIS — F419 Anxiety disorder, unspecified: Secondary | ICD-10-CM | POA: Insufficient documentation

## 2016-12-11 DIAGNOSIS — I2724 Chronic thromboembolic pulmonary hypertension: Secondary | ICD-10-CM | POA: Diagnosis not present

## 2016-12-11 DIAGNOSIS — Z86711 Personal history of pulmonary embolism: Secondary | ICD-10-CM | POA: Insufficient documentation

## 2016-12-11 DIAGNOSIS — F4321 Adjustment disorder with depressed mood: Secondary | ICD-10-CM | POA: Insufficient documentation

## 2016-12-11 DIAGNOSIS — Z23 Encounter for immunization: Secondary | ICD-10-CM

## 2016-12-11 DIAGNOSIS — I10 Essential (primary) hypertension: Secondary | ICD-10-CM | POA: Diagnosis not present

## 2016-12-11 DIAGNOSIS — I272 Pulmonary hypertension, unspecified: Secondary | ICD-10-CM | POA: Diagnosis not present

## 2016-12-11 DIAGNOSIS — Z7901 Long term (current) use of anticoagulants: Secondary | ICD-10-CM | POA: Diagnosis not present

## 2016-12-11 MED ORDER — GUAIFENESIN-DM 100-10 MG/5ML PO SYRP: 5.0000 mL | ORAL_SOLUTION | ORAL | 1 refills | Status: DC | PRN

## 2016-12-11 MED ORDER — RIOCIGUAT 1 MG PO TABS
1.0000 mg | ORAL_TABLET | Freq: Three times a day (TID) | ORAL | 11 refills | Status: DC
Start: 2016-12-11 — End: 2017-04-09

## 2016-12-11 MED ORDER — DICYCLOMINE HCL 20 MG PO TABS: 20.0000 mg | ORAL_TABLET | Freq: Two times a day (BID) | ORAL | 1 refills | Status: DC

## 2016-12-11 NOTE — Progress Notes (Signed)
[name] [name], is a 56 y.o. female  XQK:[id number]  LLV:[id number]  DOB - 11-25-1960  Chief Complaint  Patient presents with  . Cough       Subjective:   [name] [name] is a 56 y.o. 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   Patient was in her [place] until 08/2015 when she started getting symptoms of dyspnea on exertion, dizziness, bilateral LE swelling, and constant R>L non-specific chest pain. These symptoms progressively worsened over several days prompting her to go to [facility] in her then-hometown of [place] in 09/2015 where she was diagnosed with PE and started on anticoagulation. She was very briefly on warfarin but quickly switched to Xarelto. She was referred to [facility] in 12/2015. Patient refused [facility] because of financial fears and unclear insurance status. Echocardiogram (12-2015) showed normal LVEF 55-60%, normal LA, normal RV size, RVSP estimated as 70 mm Hg. She further was referred to [facility] and saw Dr. Karena Addison on 03/14/2016 where patient had several studies including V/Q scan which found high prob for PE; studies c/w CTEPH. It was felt that her chronic chest pain was caused by chronic PE. RHC/LHC/Pulm Angiogram 03/2016 confirmed diagnosis of CTEPH. She underwent pulmonary thromboembolectomy 03/31/2016 at [facility] with IVC filter placement. Pre-op cath with no CAD.   Seen by Dr. Karena Addison in 04/2016 for follow up. He felt like her heart size was decreasing. Plan was for lifelong [place] and ECHO and V/Q in 4 months. Dr. Karena Addison has since left the [facility] practice. CT angio at [facility] on 05/15/16 showed " persistent but partially recanalized PE within right lower lobe pulmonary artery. No new PE identified.  She was admitted to [facility] in 06/2016 for continued chest pain and  parasthesias. V/Q scan on 06/27/16 showed prominent ventilation perfusion mismatch in right c/w chronic PE. Head CT negative. She was continued on Xarelto and told to follow up with [facility]. Patient doesn't want to have to travel to [facility] for care so [place] care here in [place].   Patient is here today with major complaint of cough and congestion, coughing gives her chest pain especially on the incision site. Cough is productive of whitish sputum. No hemoptysis, denies SOB, no leg swelling. Patient has been having occasional dizziness and when she checks her BP, its been low, with systolic BP below 586. BP today is 106/68, patient has not taken her antihypertensive. She is also in the process of getting a new CPAP. Patient is tearful today because she is depressed and stressed out, she feels she is at "end of life" because she was perfectly fine until 2017 and ever since she was diagnosed with PE for the first time, she has not felt any relief. She is constantly worried about "mortality". She is on full anticoagulation + IVC filter, and recent evaluation showed stability. She follows up with psychiatrist and on medications but has not been adherent. She denies any suicidal ideation or thoughts. She denies any abdominal pain, no N/V/D.   Problem  Adjustment Disorder With Depressed Mood  Acute Upper Respiratory Infection  Essential Hypertension    ALLERGIES: No Known Allergies  PAST MEDICAL HISTORY: Past Medical History:  Diagnosis Date  . Arthritis   . Depression   . Diverticulitis   . Hypertension   . Obesity   .  Pneumonia   . Pulmonary embolism ([place])     MEDICATIONS AT HOME: Prior to Admission medications   Medication Sig Start Date End Date Taking? Authorizing Provider  acetaminophen-codeine (TYLENOL #3) 300-30 MG tablet Take 1 tablet by mouth every 8 (eight) hours as needed for moderate pain. Max total Tyelnol < 2000mg  /day 11/26/16  Yes Maren Reamer, MD  atorvastatin  (LIPITOR) 20 MG tablet Take 1 tablet (20 mg total) by mouth daily at 6 PM. 11/26/16  Yes Maren Reamer, MD  buPROPion (WELLBUTRIN XL) 300 MG 24 hr tablet Take 1 tablet (300 mg total) by mouth every morning. 12/04/16 12/04/17 Yes Kathlee Nations, MD  diclofenac (FLECTOR) 1.3 % PTCH Place 1 patch onto the skin 2 (two) times daily. 10/16/16  Yes Jolaine Artist, MD  diclofenac sodium (VOLTAREN) 1 % GEL Apply 2 g topically 4 (four) times daily. 08/14/16  Yes Maren Reamer, MD  dicyclomine (BENTYL) 20 MG tablet Take 1 tablet (20 mg total) by mouth 2 (two) times daily. 12/11/16  Yes Tresa Garter, MD  docusate sodium (COLACE) 100 MG capsule Take 1 capsule (100 mg total) by mouth every 12 (twelve) hours. 10/24/16  Yes Margarita Mail, PA-C  furosemide (LASIX) 40 MG tablet Take 1 tablet (40 mg total) by mouth daily. Take extra 40 mg tablet once in the afternoon AS NEEDED for weight gain 3 lbs or more. 11/21/16  Yes Jolaine Artist, MD  gabapentin (NEURONTIN) 300 MG capsule Take 1 capsule (300 mg total) by mouth 3 (three) times daily. 11/26/16 12/26/16 Yes Dawn Lazarus Gowda, MD  hydrocortisone (ANUSOL-HC) 25 MG suppository Place 1 suppository (25 mg total) rectally 2 (two) times daily. 11/26/16  Yes Maren Reamer, MD  meclizine (ANTIVERT) 25 MG tablet Take 1 tablet (25 mg total) by mouth 3 (three) times daily as needed for dizziness. 12/10/16  Yes Jolaine Artist, MD  metoprolol tartrate (LOPRESSOR) 25 MG tablet Take 1 tablet (25 mg total) by mouth 2 (two) times daily. 11/26/16  Yes Maren Reamer, MD  potassium chloride (K-DUR) 10 MEQ tablet Take 1 tablet (10 mEq total) by mouth 2 (two) times daily. 11/26/16  Yes Maren Reamer, MD  Riociguat (ADEMPAS) 1 MG TABS Take 1 mg by mouth 3 (three) times daily. 11/08/16  Yes Jolaine Artist, MD  rivaroxaban (XARELTO) 20 MG TABS tablet Take 1 tablet (20 mg total) by mouth daily with supper. 11/26/16  Yes Maren Reamer, MD  traZODone (DESYREL) 100 MG tablet  Take 1 tablet (100 mg total) by mouth at bedtime as needed for sleep. 12/04/16  Yes Kathlee Nations, MD  albuterol (PROVENTIL HFA;VENTOLIN HFA) 108 (90 Base) MCG/ACT inhaler Inhale 1-2 puffs into the lungs every 6 (six) hours as needed for wheezing or shortness of breath. Patient not taking: Reported on 11/21/2016 02/08/16   Maryan Puls, MD  guaiFENesin-dextromethorphan [facility] DM) 100-10 MG/5ML syrup Take 5 mLs by mouth every 4 (four) hours as needed for cough. 12/11/16   Tresa Garter, MD  sodium chloride (OCEAN) 0.65 % SOLN nasal spray Place 1 spray into both nostrils as needed for congestion. Patient not taking: Reported on 11/21/2016 10/14/16   Maren Reamer, MD  triamcinolone cream (KENALOG) 0.1 % Apply 1 application topically 2 (two) times daily. For rash on left leg Patient not taking: Reported on 12/10/2016 05/15/16   Daleen Bo, MD    Objective:   Vitals:   12/11/16 1006  BP: 106/68  Pulse: (!) 112  Resp: 18  Temp: 98.6 F (37 C)  TempSrc: Oral  SpO2: 95%  Weight: (!) 324 lb 3.2 oz (147.1 kg)  Height: 5\' 5"  (1.651 m)   Exam General appearance : Awake, alert, not in any distress. Speech Clear. Not toxic looking, morbidly obese, tearful HEENT: Atraumatic and Normocephalic, pupils equally reactive to light and accomodation Neck: Supple, no JVD. No cervical lymphadenopathy.  Chest: Good air entry bilaterally, no added sounds  CVS: S1 S2 regular, no murmurs.  Abdomen: Bowel sounds present, Non tender and not distended with no gaurding, rigidity or rebound. Extremities: B/L Lower Ext shows no edema, both legs are warm to touch Neurology: Awake alert, and oriented X 3, CN II-XII intact, Non focal Skin: No Rash  Data Review Lab Results  Component Value Date   HGBA1C 5.6 06/27/2016    Assessment & Plan   1. CTEPH (chronic thromboembolic pulmonary hypertension)  - Continue Xarelto - Follow up with Cardiologist and Vascular  2. Essential hypertension  - Discontinue  Amlodipine on account of relatively low BP and complain of dizziness - Continue Metoprolol for Tachycardia - Continue other medications  - Record ambulatory BP and call me if > 130/80 consistently or lower than 100/60 mmHg  3. Acute upper respiratory infection  - guaiFENesin-dextromethorphan (ROBITUSSIN DM) 100-10 MG/5ML syrup; Take 5 mLs by mouth every 4 (four) hours as needed for cough.  Dispense: 480 mL; Refill: 1  4. Adjustment disorder with depressed mood  - Continue Follow up with Psychiatrist - Continue Wellbutrin and Trazodone  Patient have been counseled extensively about nutrition and exercise. Other issues discussed during this visit include: low cholesterol diet, weight control and daily exercise, importance of adherence with medications and regular follow-up. We also discussed long term complications of uncontrolled hypertension.   Return in about 3 months (around 03/12/2017) for Heart Failure and Hypertension, PE, Follow up HTN.  The patient was given clear instructions to go to ER or return to medical center if symptoms don't improve, worsen or new problems develop. The patient verbalized understanding. The patient was told to call to get lab results if they haven't heard anything in the next week.   This note has been created with Surveyor, quantity. Any transcriptional errors are unintentional.    Angelica Chessman, MD, [place], Karilyn Cota, [place] and [facility], [place]   12/11/2016, 11:14 AM

## 2016-12-11 NOTE — Patient Instructions (Signed)
Pulmonary Embolism A pulmonary embolism (PE) is a sudden blockage or decrease of blood flow in one lung or both lungs. Most blockages come from a blood clot that travels from the legs or the pelvis to the lungs. PE is a dangerous and potentially life-threatening condition if it is not treated right away. What are the causes? A pulmonary embolism occurs most commonly when a blood clot travels from one of your veins to your lungs. Rarely, PE is caused by air, fat, amniotic fluid, or part of a tumor traveling through your veins to your lungs. What increases the risk? A PE is more likely to develop in:  People who smoke.  People who areolder, especially over 53 years of age.  People who are overweight (obese).  People who sit or lie still for a long time, such as during long-distance travel (over 4 hours), bed rest, hospitalization, or during recovery from certain medical conditions like a stroke.  People who do not engage in much physical activity (sedentary lifestyle).  People who have chronic breathing disorders.  People whohave a personal or family history of blood clots or blood clotting disease.  People whohave peripheral vascular disease (PVD), diabetes, or some types of cancer.  People who haveheart disease, especially if the person had a recent heart attack or has congestive heart failure.  People who have neurological diseases that affect the legs (leg paresis).  People who have had a traumatic injury, such as breaking a hip or leg.  People whohave recently had major or lengthy surgery, especially on the hip, knee, or abdomen.  People who have hada central line placed inside a large vein.  People who takemedicines that contain the hormone estrogen. These include birth control pills and hormone replacement therapy.  Pregnancy or during childbirth or the postpartum period. What are the signs or symptoms? The symptoms of a PE usually start suddenly and  include:  Shortness of breath while active or at rest.  Coughing or coughing up blood or blood-tinged mucus.  Chest pain that is often worse with deep breaths.  Rapid or irregular heartbeat.  Feeling light-headed or dizzy.  Fainting.  Feelinganxious.  Sweating. There may also be pain and swelling in a leg if that is where the blood clot started. These symptoms may represent a serious problem that is an emergency. Do not wait to see if the symptoms will go away. Get medical help right away. Call your local emergency services (911 in the [place] Do not drive yourself to the hospital.  How is this diagnosed? Your health care provider will take a medical history and perform a physical exam. You may also have other tests, including:  Blood tests to assess the clotting properties of your blood, assess oxygen levels in your blood, and find blood clots.  Imaging tests, such as CT, ultrasound, MRI, X-ray, and other tests to see if you have clots anywhere in your body.  An electrocardiogram (ECG) to look for heart strain from blood clots in the lungs. How is this treated? The main goals of PE treatment are:  To stop a blood clot from growing larger.  To stop new blood clots from forming. The type of treatment that you receive depends on many factors, such as the cause of your PE, your risk for bleeding or developing more clots, and other medical conditions that you have. Sometimes, a combination of treatments is necessary. This condition may be treated with:  Medicines, including newer oral blood thinners (anticoagulants), warfarin, low  molecular weight heparins, thrombolytics, or heparins.  Wearing compression stockings or using different types of devices.  Surgery (rare) to remove the blood clot or to place a filter in your abdomen to stop the blood clot from traveling to your lungs. Treatments for a PE are often divided into immediate treatment, long-term treatment (up to 3 months  after PE), and extended treatment (more than 3 months after PE). Your treatment may continue for several months. This is called maintenance therapy, and it is used to prevent the forming of new blood clots. You can work with your health care provider to choose the treatment program that is best for you. What are anticoagulants?  Anticoagulants are medicines that treat PEs. They can stop current blood clots from growing and stop new clots from forming. They cannot dissolve existing clots. Your body dissolves clots by itself over time. Anticoagulants are given by mouth, by injection, or through an IV tube. What are thrombolytics?  Thrombolytics are clot-dissolving medicines that are used to dissolve a PE. They carry a high risk of bleeding, so they tend to be used only in severe cases or if you have very low blood pressure. Follow these instructions at home: If you are taking a newer oral anticoagulant:   Take the medicine every single day at the same time each day.  Understand what foods and drugs interact with this medicine.  Understand that there are no regular blood tests required when using this medicine.  Understandthe side effects of this medicine, including excessive bruising or bleeding. Ask your health care provider or pharmacist about other possible side effects. If you are taking warfarin:   Understand how to take warfarin and know which foods can affect how warfarin works in Veterinary surgeon.  Understand that it is dangerous to taketoo much or too little warfarin. Too much warfarin increases the risk of bleeding. Too little warfarin continues to allow the risk for blood clots.  Follow your PT and INR blood testing schedule. The PT and INR results allow your health care provider to adjust your dose of warfarin. It is very important that you have your PT and INR tested as often as told by your health care provider.  Avoid major changes in your diet, or tell your health care provider before  you change your diet. Arrange a visit with a registered dietitian to answer your questions. Many foods, especially foods that are high in vitamin K, can interfere with warfarin and affect the PT and INR results. Eat a consistent amount of foods that are high in vitamin K, such as:  Spinach, kale, broccoli, cabbage, collard greens, turnip greens, Brussels sprouts, peas, cauliflower, seaweed, and parsley.  Beef liver and pork liver.  Green tea.  Soybean oil.  Tell your health care provider about any and all medicines, vitamins, and supplements that you take, including aspirin and other over-the-counter anti-inflammatory medicines. Be especially cautious with aspirin and anti-inflammatory medicines. Do not take those before you ask your health care provider if it is safe to do so. This is important because many medicines can interfere with warfarin and affect the PT and INR results.  Do not start or stop taking any over-the-counter or prescription medicine unless your health care provider or pharmacist tells you to do so. If you take warfarin, you will also need to do these things:  Hold pressure over cuts for longer than usual.  Tell your dentist and other health care providers that you are taking warfarin before you have  any procedures in which bleeding may occur.  Avoid alcohol or drink very small amounts. Tell your health care provider if you change your alcohol intake.  Do not use tobacco products, including cigarettes, chewing tobacco, and e-cigarettes. If you need help quitting, ask your health care provider.  Avoid contact sports. General instructions   Take over-the-counter and prescription medicines only as told by your health care provider. Anticoagulant medicines can have side effects, including easy bruising and difficulty stopping bleeding. If you are prescribed an anticoagulant, you will also need to do these things:  Hold pressure over cuts for longer than usual.  Tell your  dentist and other health care providers that you are taking anticoagulants before you have any procedures in which bleeding may occur.  Avoid contact sports.  Wear a medical alert bracelet or carry a medical alert card that says you have had a PE.  Ask your health care provider how soon you can go back to your normal activities. Stay active to prevent new blood clots from forming.  Make sure to exercise while traveling or when you have been sitting or standing for a long period of time. It is very important to exercise. Exercise your legs by walking or by tightening and relaxing your leg muscles often. Take frequent walks.  Wear compression stockings as told by your health care provider to help prevent more blood clots from forming.  Do not use tobacco products, including cigarettes, chewing tobacco, and e-cigarettes. If you need help quitting, ask your health care provider.  Keep all follow-up appointments with your health care provider. This is important. How is this prevented? Take these actions to decrease your risk of developing another PE:  Exercise regularly. For at least 30 minutes every day, engage in:  Activity that involves moving your arms and legs.  Activity that encourages good blood flow through your body by increasing your heart rate.  Exercise your arms and legs every hour during long-distance travel (over 4 hours). Drink plenty of water and avoid drinking alcohol while traveling.  Avoid sitting or lying in bed for long periods of time without moving your legs.  Maintain a weight that is appropriate for your height. Ask your health care provider what weight is healthy for you.  If you are a woman who is over 72 years of age, avoid unnecessary use of medicines that contain estrogen. These include birth control pills.  Do not smoke, especially if you take estrogen medicines. If you need help quitting, ask your health care provider.  If you are at very high risk for  PE, wear compression stockings.  If you recently had a PE, have regularly scheduled ultrasound testing on your legs to check for new blood clots. If you are hospitalized, prevention measures may include:  Early walking after surgery, as soon as your health care provider says that it is safe.  Receiving anticoagulants to prevent blood clots. If you cannot take anticoagulants, other options may be available, such as wearing compression stockings or using different types of devices. Get help right away if:  You have new or increased pain, swelling, or redness in an arm or leg.  You have numbness or tingling in an arm or leg.  You have shortness of breath while active or at rest.  You have chest pain.  You have a rapid or irregular heartbeat.  You feel light-headed or dizzy.  You cough up blood.  You notice blood in your vomit, bowel movement, or urine.  You have a fever. These symptoms may represent a serious problem that is an emergency. Do not wait to see if the symptoms will go away. Get medical help right away. Call your local emergency services (911 in the [place] Do not drive yourself to the hospital. This information is not intended to replace advice given to you by your health care provider. Make sure you discuss any questions you have with your health care provider. Document Released: 08/23/2000 Document Revised: 02/01/2016 Document Reviewed: 12/21/2014 Elsevier Interactive Patient Education  2017 [employer] Hypertension Hypertension, commonly called high blood pressure, is when the force of blood pumping through the arteries is too strong. The arteries are the blood vessels that carry blood from the heart throughout the body. Hypertension forces the heart to work harder to pump blood and may cause arteries to become narrow or stiff. Having untreated or uncontrolled hypertension can cause heart attacks, strokes, kidney disease, and other problems. A blood pressure reading  consists of a higher number over a lower number. Ideally, your blood pressure should be below 120/80. The first ("top") number is called the systolic pressure. It is a measure of the pressure in your arteries as your heart beats. The second ("bottom") number is called the diastolic pressure. It is a measure of the pressure in your arteries as the heart relaxes. What are the causes? The cause of this condition is not known. What increases the risk? Some risk factors for high blood pressure are under your control. Others are not. Factors you can change   Smoking.  Having type 2 diabetes mellitus, high cholesterol, or both.  Not getting enough exercise or physical activity.  Being overweight.  Having too much fat, sugar, calories, or salt (sodium) in your diet.  Drinking too much alcohol. Factors that are difficult or impossible to change   Having chronic kidney disease.  Having a family history of high blood pressure.  Age. Risk increases with age.  Race. You may be at higher risk if you are African-American.  Gender. Men are at higher risk than women before age 78. After age 15, women are at higher risk than men.  Having obstructive sleep apnea.  Stress. What are the signs or symptoms? Extremely high blood pressure (hypertensive crisis) may cause:  Headache.  Anxiety.  Shortness of breath.  Nosebleed.  Nausea and vomiting.  Severe chest pain.  Jerky movements you cannot control (seizures). How is this diagnosed? This condition is diagnosed by measuring your blood pressure while you are seated, with your arm resting on a surface. The cuff of the blood pressure monitor will be placed directly against the skin of your upper arm at the level of your heart. It should be measured at least twice using the same arm. Certain conditions can cause a difference in blood pressure between your right and left arms. Certain factors can cause blood pressure readings to be lower or  higher than normal (elevated) for a short period of time:  When your blood pressure is higher when you are in a health care provider's office than when you are at home, this is called white coat hypertension. Most people with this condition do not need medicines.  When your blood pressure is higher at home than when you are in a health care provider's office, this is called masked hypertension. Most people with this condition may need medicines to control blood pressure. If you have a high blood pressure reading during one visit or you have normal blood  pressure with other risk factors:  You may be asked to return on a different day to have your blood pressure checked again.  You may be asked to monitor your blood pressure at home for 1 week or longer. If you are diagnosed with hypertension, you may have other blood or imaging tests to help your health care provider understand your overall risk for other conditions. How is this treated? This condition is treated by making healthy lifestyle changes, such as eating healthy foods, exercising more, and reducing your alcohol intake. Your health care provider may prescribe medicine if lifestyle changes are not enough to get your blood pressure under control, and if:  Your systolic blood pressure is above 130.  Your diastolic blood pressure is above 80. Your personal target blood pressure may vary depending on your medical conditions, your age, and other factors. Follow these instructions at home: Eating and drinking   Eat a diet that is high in fiber and potassium, and low in sodium, added sugar, and fat. An example eating plan is called the DASH (Dietary Approaches to Stop Hypertension) diet. To eat this way:  Eat plenty of fresh fruits and vegetables. Try to fill half of your plate at each meal with fruits and vegetables.  Eat whole grains, such as whole wheat pasta, brown rice, or whole grain bread. Fill about one quarter of your plate with  whole grains.  Eat or drink low-fat dairy products, such as skim milk or low-fat yogurt.  Avoid fatty cuts of meat, processed or cured meats, and poultry with skin. Fill about one quarter of your plate with lean proteins, such as fish, chicken without skin, beans, eggs, and tofu.  Avoid premade and processed foods. These tend to be higher in sodium, added sugar, and fat.  Reduce your daily sodium intake. Most people with hypertension should eat less than 1,500 mg of sodium a day.  Limit alcohol intake to no more than 1 drink a day for nonpregnant women and 2 drinks a day for men. One drink equals 12 oz of beer, 5 oz of wine, or 1 oz of hard liquor. Lifestyle   Work with your health care provider to maintain a healthy body weight or to lose weight. Ask what an ideal weight is for you.  Get at least 30 minutes of exercise that causes your heart to beat faster (aerobic exercise) most days of the week. Activities may include walking, swimming, or biking.  Include exercise to strengthen your muscles (resistance exercise), such as pilates or lifting weights, as part of your weekly exercise routine. Try to do these types of exercises for 30 minutes at least 3 days a week.  Do not use any products that contain nicotine or tobacco, such as cigarettes and e-cigarettes. If you need help quitting, ask your health care provider.  Monitor your blood pressure at home as told by your health care provider.  Keep all follow-up visits as told by your health care provider. This is important. Medicines   Take over-the-counter and prescription medicines only as told by your health care provider. Follow directions carefully. Blood pressure medicines must be taken as prescribed.  Do not skip doses of blood pressure medicine. Doing this puts you at risk for problems and can make the medicine less effective.  Ask your health care provider about side effects or reactions to medicines that you should watch  for. Contact a health care provider if:  You think you are having a reaction to a  medicine you are taking.  You have headaches that keep coming back (recurring).  You feel dizzy.  You have swelling in your ankles.  You have trouble with your vision. Get help right away if:  You develop a severe headache or confusion.  You have unusual weakness or numbness.  You feel faint.  You have severe pain in your chest or abdomen.  You vomit repeatedly.  You have trouble breathing. Summary  Hypertension is when the force of blood pumping through your arteries is too strong. If this condition is not controlled, it may put you at risk for serious complications.  Your personal target blood pressure may vary depending on your medical conditions, your age, and other factors. For most people, a normal blood pressure is less than 120/80.  Hypertension is treated with lifestyle changes, medicines, or a combination of both. Lifestyle changes include weight loss, eating a healthy, low-sodium diet, exercising more, and limiting alcohol. This information is not intended to replace advice given to you by your health care provider. Make sure you discuss any questions you have with your health care provider. Document Released: 08/26/2005 Document Revised: 07/24/2016 Document Reviewed: 07/24/2016 Elsevier Interactive Patient Education  2017 [employer].

## 2016-12-11 NOTE — Progress Notes (Signed)
Patient is here for Cough symptoms.  Patient has taken medication today. Patient has eaten today.  Patient complains of cough and congestion being present. With sharp pains at the incision site. Pain is scaled at an 8.  Patient request refill on Bentyl. Patient tolerated injection well today.

## 2016-12-12 ENCOUNTER — Telehealth: Payer: Self-pay

## 2016-12-12 DIAGNOSIS — G4733 Obstructive sleep apnea (adult) (pediatric): Secondary | ICD-10-CM | POA: Diagnosis not present

## 2016-12-12 NOTE — Telephone Encounter (Signed)
Call received from the patient who stated that she has her mask/supplies  for her CPAP and she slept well last night. She was very pleased to have finally received her supplies,

## 2016-12-12 NOTE — Telephone Encounter (Signed)
Attempted to contact the patient to confirm that she received her CPAP supplies from [facility] yesterday. Call placed to # [phone] and her husband took a message for her to return the call to this CM # [phone].  Call placed to [facility] to confirm that the patient went to her appointment for mask fitting and supplies yesterday. As per Lytle Michaels, the patient did keep the appointment, she was fitted for the mask and supplies were ordered.

## 2016-12-13 ENCOUNTER — Ambulatory Visit (HOSPITAL_COMMUNITY): Payer: Self-pay | Admitting: Psychiatry

## 2016-12-16 ENCOUNTER — Ambulatory Visit: Payer: Self-pay | Admitting: Internal Medicine

## 2016-12-19 ENCOUNTER — Encounter (HOSPITAL_COMMUNITY): Payer: Self-pay

## 2016-12-19 ENCOUNTER — Ambulatory Visit (HOSPITAL_COMMUNITY)
Admission: RE | Admit: 2016-12-19 | Discharge: 2016-12-19 | Disposition: A | Discharge status: Home / Self Care | Payer: Medicare HMO | Source: Ambulatory Visit | Attending: Internal Medicine | Admitting: Internal Medicine

## 2016-12-19 DIAGNOSIS — Z452 Encounter for adjustment and management of vascular access device: Secondary | ICD-10-CM | POA: Diagnosis not present

## 2016-12-19 LAB — COMPREHENSIVE METABOLIC PANEL
ALT: 10 U/L — ABNORMAL LOW (ref 14–54)
AST: 14 U/L — ABNORMAL LOW (ref 15–41)
Albumin: 3.3 g/dL — ABNORMAL LOW (ref 3.5–5.0)
Alkaline Phosphatase: 73 U/L (ref 38–126)
Anion gap: 6 (ref 5–15)
BUN: 13 mg/dL (ref 6–20)
CO2: 30 mmol/L (ref 22–32)
Calcium: 9.1 mg/dL (ref 8.9–10.3)
Chloride: 105 mmol/L (ref 101–111)
Creatinine, Ser: 1.04 mg/dL — ABNORMAL HIGH (ref 0.44–1.00)
GFR calc Af Amer: >60 mL/min (ref >60)
GFR calc non Af Amer: 60 mL/min — ABNORMAL LOW (ref >60)
Glucose, Bld: 104 mg/dL — ABNORMAL HIGH (ref 65–99)
Potassium: 3.5 mmol/L (ref 3.5–5.1)
Sodium: 141 mmol/L (ref 135–145)
Total Bilirubin: 0.4 mg/dL (ref 0.3–1.2)
Total Protein: 6.7 g/dL (ref 6.5–8.1)

## 2016-12-19 LAB — CBC WITH DIFFERENTIAL/PLATELET
Basophils Absolute: 0 10*3/uL (ref 0.0–0.1)
Basophils Relative: 0 %
Eosinophils Absolute: 0.2 10*3/uL (ref 0.0–0.7)
Eosinophils Relative: 3 %
HCT: 33.9 % — ABNORMAL LOW (ref 36.0–46.0)
Hemoglobin: 10.8 g/dL — ABNORMAL LOW (ref 12.0–15.0)
Lymphocytes Relative: 36 %
Lymphs Abs: 2.2 10*3/uL (ref 0.7–4.0)
MCH: 20.8 pg — ABNORMAL LOW (ref 26.0–34.0)
MCHC: 31.9 g/dL (ref 30.0–36.0)
MCV: 65.4 fL — ABNORMAL LOW (ref 78.0–100.0)
Monocytes Absolute: 0.4 10*3/uL (ref 0.1–1.0)
Monocytes Relative: 7 %
Neutro Abs: 3.2 10*3/uL (ref 1.7–7.7)
Neutrophils Relative %: 54 %
Platelets: 326 10*3/uL (ref 150–400)
RBC: 5.18 MIL/uL — ABNORMAL HIGH (ref 3.87–5.11)
RDW: 17.7 % — ABNORMAL HIGH (ref 11.5–15.5)
WBC: 6 10*3/uL (ref 4.0–10.5)

## 2016-12-19 MED ORDER — HEPARIN SOD (PORK) LOCK FLUSH 100 UNIT/ML IV SOLN
500.0000 [IU] | INTRAVENOUS | Status: DC
  Administered 2016-12-19: 500 [IU]
  Filled 2016-12-19: qty 5

## 2016-12-19 NOTE — Progress Notes (Signed)
Pt here to have her Portacath flushed, pt also brought in orders from Dr. Janne Napoleon to draw cbc, cmp, liver, and hepatitis C antibody.  Orders placed in epic, drawn from port and sent to lab.  Port was flushed as well.

## 2016-12-20 ENCOUNTER — Other Ambulatory Visit: Payer: Self-pay | Admitting: Internal Medicine

## 2016-12-20 LAB — HEPATITIS C ANTIBODY: HCV Ab: <0.1 s/co ratio (ref 0.0–0.9)

## 2016-12-23 ENCOUNTER — Encounter (HOSPITAL_COMMUNITY): Payer: Medicare HMO | Admitting: Internal Medicine

## 2016-12-24 ENCOUNTER — Telehealth (HOSPITAL_COMMUNITY): Payer: Self-pay | Admitting: Psychiatry

## 2016-12-25 ENCOUNTER — Telehealth: Payer: Self-pay | Admitting: Internal Medicine

## 2016-12-25 ENCOUNTER — Ambulatory Visit (HOSPITAL_COMMUNITY): Payer: Self-pay | Admitting: Psychiatry

## 2016-12-25 DIAGNOSIS — Z7901 Long term (current) use of anticoagulants: Secondary | ICD-10-CM

## 2016-12-25 MED ORDER — FERROUS SULFATE 325 (65 FE) MG PO TABS: 325.0000 mg | ORAL_TABLET | Freq: Every day | ORAL | 3 refills | Status: DC

## 2016-12-25 MED ORDER — WARFARIN SODIUM 5 MG PO TABS: 5.0000 mg | ORAL_TABLET | Freq: Every day | ORAL | 3 refills | Status: DC

## 2016-12-25 NOTE — Telephone Encounter (Signed)
Called pt, confirmed dob  1. Pt has been off eliquis for couple days due to inability to afford the copay >$100.  Lengthy discussion about anticoagulants, cheapest alternative is coumadin - which she is amendable to starting.   Understands will need more frequent lab draws.  Sent rx to [facility] coumadin 5mg  qhs - start when pick up.  Erline Levine - can you f/u w/ her in your coumadin clinic, education, etc.  ** she is difficult stick, would need to print out INR orders and have her get drawn at surg day center through her port if unable to get in clinic ** ** please dc her Xarelto once you see her and ensure she is on Warfarin - I held off on dc'd that rx in mean time. thx.  Amy - can you set up f/u appt for her w/ Erline Levine end of this week or early next week  2. Reviewed her recent labs w/ her. Iron def anemia, advised starting iron 325 qd, rx given as well.

## 2016-12-25 NOTE — Telephone Encounter (Signed)
Patient with [facility] is a tier 3 as is Elliquis. Pradaxa is a tier 4. Warfarin would be the only cheaper option but the drawback is frequent INR testing.

## 2016-12-25 NOTE — Telephone Encounter (Signed)
Pt calling to ask if there is another type of blood thinner that she can be prescribed because she cannot afford Xarelto. Please f/u.

## 2016-12-26 ENCOUNTER — Encounter: Payer: Self-pay | Admitting: Pharmacist

## 2016-12-30 ENCOUNTER — Telehealth: Payer: Self-pay | Admitting: Internal Medicine

## 2016-12-30 MED ORDER — ACETAMINOPHEN-CODEINE #3 300-30 MG PO TABS: 1.0000 | ORAL_TABLET | Freq: Three times a day (TID) | ORAL | 0 refills | Status: DC | PRN

## 2016-12-30 NOTE — Telephone Encounter (Signed)
Pt calling to state that she has appt Tuesday 12/31/16 with Theda Sers and would like to know if she can pick up her script for Tylenol 3 while she is here. Please f/u.

## 2016-12-31 ENCOUNTER — Encounter: Payer: Self-pay | Admitting: Pharmacist

## 2017-01-02 ENCOUNTER — Ambulatory Visit (INDEPENDENT_AMBULATORY_CARE_PROVIDER_SITE_OTHER): Payer: Medicare HMO | Admitting: Psychiatry

## 2017-01-02 ENCOUNTER — Encounter (HOSPITAL_COMMUNITY): Payer: Self-pay | Admitting: Psychiatry

## 2017-01-02 VITALS — BP 132/80 | HR 92 | Ht 65.0 in | Wt 322.6 lb

## 2017-01-02 DIAGNOSIS — F330 Major depressive disorder, recurrent, mild: Secondary | ICD-10-CM | POA: Diagnosis not present

## 2017-01-02 DIAGNOSIS — Z79899 Other long term (current) drug therapy: Secondary | ICD-10-CM

## 2017-01-02 DIAGNOSIS — F419 Anxiety disorder, unspecified: Secondary | ICD-10-CM | POA: Diagnosis not present

## 2017-01-02 DIAGNOSIS — Z813 Family history of other psychoactive substance abuse and dependence: Secondary | ICD-10-CM

## 2017-01-02 DIAGNOSIS — Z818 Family history of other mental and behavioral disorders: Secondary | ICD-10-CM

## 2017-01-02 DIAGNOSIS — Z810 Family history of intellectual disabilities: Secondary | ICD-10-CM

## 2017-01-02 MED ORDER — BUPROPION HCL ER (XL) 300 MG PO TB24: 300.0000 mg | ORAL_TABLET | ORAL | 1 refills | Status: DC

## 2017-01-02 MED ORDER — BREXPIPRAZOLE 0.5 MG PO TABS: 0.5000 mg | ORAL_TABLET | Freq: Every day | ORAL | 0 refills | Status: DC

## 2017-01-02 MED ORDER — TRAZODONE HCL 150 MG PO TABS: 150.0000 mg | ORAL_TABLET | Freq: Every evening | ORAL | 1 refills | Status: DC | PRN

## 2017-01-02 NOTE — Progress Notes (Signed)
BH MD/PA/NP OP Progress Note  01/02/2017 1:47 PM [name] [name]  MRN:  [id number]  Chief Complaint:  Subjective:  I don't see any improvement.  I have no energy.  I still feel depressed and sad.  HPI: [name] [name] came for her follow-up appointment.  On her last visit we increased Wellbutrin 300 mg but she does not see any improvement.  She continues to have crying spells, lack of motivation, lack of energy, lack of interest to do things.  We also offered intensive outpatient program but patient refused these recommendations.  She tried once session with Eloise Levels however next appointment was canceled and now she is not scheduled until mid-June.  Her biggest stressor is 28-year-old nephew who they have custody .  Patient told he has lot of behavioral issue and he was admitted to months ago at behavioral [place].  Patient endorse that he is seeing therapist at [facility] but do not believe he is getting better.  Patient is sleeping somewhat better with trazodone.  She endorse hopelessness and some time anhedonia.  We discussed trying a different medication but patient is very resistant to change her medication.  She is afraid that medicine will cause weight gain.  In the past she has taken Prozac, Zoloft, Celexa, Cymbalta, amitriptyline, hydroxyzine with limited response.  She has morbid obesity.  Patient denies any paranoia or any hallucination.  Her energy level is fair.  Patient denies drinking alcohol or using any illegal substances.  She lives with her husband.  Visit Diagnosis:    ICD-9-CM ICD-10-CM   1. Mild episode of recurrent major depressive disorder (HCC) 296.31 F33.0 Brexpiprazole (REXULTI) 0.5 MG TABS     buPROPion (WELLBUTRIN XL) 300 MG 24 hr tablet     traZODone (DESYREL) 150 MG tablet     DISCONTINUED: traZODone (DESYREL) 150 MG tablet     DISCONTINUED: buPROPion (WELLBUTRIN XL) 300 MG 24 hr tablet    Past Psychiatric History: Reviewed. Patient remember started depression when she  lost her baby who was 2 days old.  She took psychiatric medication for little while and then did very well for 10 years without medication.  She remembered taking Prozac, Zoloft when she was living in [place].  She was also given Celexa, Cymbalta, amitriptyline, Neurontin and hydroxyzine by her primary care physician with limited response.  Patient denies any psychiatric inpatient treatment or any suicidal attempt.  Patient denies any history of psychosis or any hallucination.  Past Medical History:  Past Medical History:  Diagnosis Date  . Arthritis   . Depression   . Diverticulitis   . Hypertension   . Obesity   . Pneumonia   . Pulmonary embolism [facility])     Past Surgical History:  Procedure Laterality Date  . ABDOMINAL HYSTERECTOMY    . BREAST REDUCTION SURGERY    . CHOLECYSTECTOMY    . EMBOLECTOMY N/A    pulmonary embolectomy  . RIGHT HEART CATH N/A 10/28/2016   Procedure: Right Heart Cath;  Surgeon: Jolaine Artist, MD;  Location: [place] [facility];  Service: Cardiovascular;  Laterality: N/A;    Family Psychiatric History: Reviewed.  Family History:  Family History  Problem Relation Age of Onset  . Lung cancer Maternal Grandmother   . Dementia Mother   . Dementia Father   . Anxiety disorder Sister   . Bipolar disorder Sister   . Drug abuse Sister   . Sexual abuse Maternal Aunt   . Anxiety disorder Cousin   . Anxiety  disorder Sister   . Bipolar disorder Sister   . Drug abuse Sister     Social History:  Social History   Social History  . Marital status: Married    Spouse name: N/A  . Number of children: N/A  . Years of education: N/A   Social History Main Topics  . Smoking status: Never Smoker  . Smokeless tobacco: Never Used  . Alcohol use No  . Drug use: No  . Sexual activity: Yes    Partners: Male    Birth control/ protection: Surgical   Other Topics Concern  . None   Social History Narrative  . None    Allergies: No Known  Allergies  Metabolic Disorder Labs: Lab Results  Component Value Date   HGBA1C 5.6 06/27/2016   MPG 114 06/27/2016   No results found for: PROLACTIN Lab Results  Component Value Date   CHOL 166 06/27/2016   TRIG 125 06/27/2016   HDL 33 (L) 06/27/2016   CHOLHDL 5.0 06/27/2016   VLDL 25 06/27/2016   LDLCALC 108 (H) 06/27/2016     Current Medications: Current Outpatient Prescriptions  Medication Sig Dispense Refill  . acetaminophen-codeine (TYLENOL #3) 300-30 MG tablet Take 1 tablet by mouth every 8 (eight) hours as needed for moderate pain. Max total Tyelnol < 2000mg  /day 50 tablet 0  . albuterol (PROVENTIL HFA;VENTOLIN HFA) 108 (90 Base) MCG/ACT inhaler Inhale 1-2 puffs into the lungs every 6 (six) hours as needed for wheezing or shortness of breath. 6.7 g 5  . atorvastatin (LIPITOR) 20 MG tablet Take 1 tablet (20 mg total) by mouth daily at 6 PM. 90 tablet 2  . buPROPion (WELLBUTRIN XL) 300 MG 24 hr tablet Take 1 tablet (300 mg total) by mouth every morning. 30 tablet 0  . diclofenac (FLECTOR) 1.3 % PTCH Place 1 patch onto the skin 2 (two) times daily. 60 patch 6  . diclofenac sodium (VOLTAREN) 1 % GEL Apply 2 g topically 4 (four) times daily. 100 g 2  . dicyclomine (BENTYL) 20 MG tablet Take 1 tablet (20 mg total) by mouth 2 (two) times daily. 30 tablet 1  . docusate sodium (COLACE) 100 MG capsule Take 1 capsule (100 mg total) by mouth every 12 (twelve) hours. 30 capsule 0  . ferrous sulfate (FERROUSUL) 325 (65 FE) MG tablet Take 1 tablet (325 mg total) by mouth daily with breakfast. 90 tablet 3  . furosemide (LASIX) 40 MG tablet Take 1 tablet (40 mg total) by mouth daily. Take extra 40 mg tablet once in the afternoon AS NEEDED for weight gain 3 lbs or more. 60 tablet 3  . guaiFENesin-dextromethorphan (ROBITUSSIN DM) 100-10 MG/5ML syrup Take 5 mLs by mouth every 4 (four) hours as needed for cough. 480 mL 1  . hydrocortisone (ANUSOL-HC) 25 MG suppository Place 1 suppository (25 mg  total) rectally 2 (two) times daily. 24 suppository 0  . meclizine (ANTIVERT) 25 MG tablet TAKE ONE TABLET BY MOUTH THREE TIMES DAILY AS NEEDED FOR  DIZZINESS 60 tablet 0  . metoprolol tartrate (LOPRESSOR) 25 MG tablet Take 1 tablet (25 mg total) by mouth 2 (two) times daily. 180 tablet 3  . potassium chloride (K-DUR) 10 MEQ tablet Take 1 tablet (10 mEq total) by mouth 2 (two) times daily. 30 tablet 2  . Riociguat (ADEMPAS) 1 MG TABS Take 1 mg by mouth 3 (three) times daily. 90 tablet 11  . rivaroxaban (XARELTO) 20 MG TABS tablet Take 1 tablet (20 mg total) by  mouth daily with supper. 90 tablet 3  . sodium chloride (OCEAN) 0.65 % SOLN nasal spray Place 1 spray into both nostrils as needed for congestion. 1 Bottle 11  . traZODone (DESYREL) 100 MG tablet Take 1 tablet (100 mg total) by mouth at bedtime as needed for sleep. 30 tablet 0  . triamcinolone cream (KENALOG) 0.1 % Apply 1 application topically 2 (two) times daily. For rash on left leg 30 g 0  . gabapentin (NEURONTIN) 300 MG capsule Take 1 capsule (300 mg total) by mouth 3 (three) times daily. 90 capsule 1  . warfarin (COUMADIN) 5 MG tablet Take 1 tablet (5 mg total) by mouth daily at 6 PM. 30 tablet 3   No current facility-administered medications for this visit.     Neurologic: Headache: No Seizure: No Paresthesias: No  Musculoskeletal: Strength & Muscle Tone: within normal limits Gait & Station: normal Patient leans: N/A  Psychiatric Specialty Exam: Review of Systems  Constitutional: Positive for malaise/fatigue.  HENT: Negative.   Musculoskeletal: Positive for back pain and joint pain.  Skin: Negative.  Negative for itching and rash.  Neurological: Negative.   Psychiatric/Behavioral: Positive for depression. The patient is nervous/anxious.     Blood pressure 132/80, pulse 92, height 5\' 5"  (1.651 m), weight (!) 322 lb 9.6 oz (146.3 kg).Body mass index is 53.68 kg/m.  General Appearance: Casual  Eye Contact:  Good   Speech:  Slow  Volume:  Decreased  Mood:  Depressed and Dysphoric  Affect:  Constricted  Thought Process:  Goal Directed  Orientation:  Full (Time, Place, and Person)  Thought Content: Rumination   Suicidal Thoughts:  No  Homicidal Thoughts:  No  Memory:  Immediate;   Fair Recent;   Good Remote;   Good  Judgement:  Good  Insight:  Fair  Psychomotor Activity:  Decreased  Concentration:  Concentration: Fair  Recall:  Good  Fund of Knowledge: Good  Language: Good  Akathisia:  No  Handed:  Right  AIMS (if indicated):  0  Assets:  Communication Skills Desire for Improvement Housing  ADL's:  Intact  Cognition: WNL  Sleep:  Improved     Assessment: Major depressive disorder, recurrent.  Anxiety disorder NOS.  Plan: Patient continues to have symptoms of chronic depression and anxiety.  Sometimes she feels hopeless and discouraged about getting better.  After some encouragement she agreed to try REXULTI 0.5 mg at bedtime to help with residual depression.  I will also increase trazodone 150 mg for insomnia.  Continue Wellbutrin XL 300 mg daily.  Patient does not want to change her Wellbutrin.  I also encouraged to see Eloise Levels for counseling.  Patient is not interested in intensive outpatient program.  Recommended to call us back if she has any question, concern or if she feels worsening of the symptom.  Discuss safety plan that anytime having active suicidal thoughts or homicidal thought that she need to call 911 or go to emergency room.  Follow-up in 4-6 weeks.  ARFEEN,SYED T., MD 01/02/2017, 1:47 PM

## 2017-01-06 ENCOUNTER — Ambulatory Visit (HOSPITAL_COMMUNITY): Payer: Self-pay | Admitting: Psychiatry

## 2017-01-08 ENCOUNTER — Ambulatory Visit: Payer: Medicare HMO | Attending: Internal Medicine | Admitting: Pharmacist

## 2017-01-08 ENCOUNTER — Other Ambulatory Visit: Payer: Self-pay | Admitting: Internal Medicine

## 2017-01-08 DIAGNOSIS — I2782 Chronic pulmonary embolism: Secondary | ICD-10-CM

## 2017-01-08 DIAGNOSIS — I2724 Chronic thromboembolic pulmonary hypertension: Secondary | ICD-10-CM

## 2017-01-08 LAB — POCT INR: INR: 1.6

## 2017-01-08 NOTE — Progress Notes (Signed)
    Pharmacy Anticoagulation Clinic  Subjective: Patient presents today for INR monitoring. 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   Current dose of warfarin: 5 mg daily  Adherence to warfarin: denies any missed doses Signs/symptoms of bleeding: reports some minor bruising but denies any bloeeding Recent changes in diet: denies Recent changes in medications: denies   Objective: Today's INR = 1.6  Lab Results  Component Value Date   INR 1.6 01/08/2017   INR 1.34 11/12/2015     Assessment and Plan: Anticoagulation: Patient is Subtherapeutic based on patient's INR of 1.6 and patient's INR goal of 2-3. Will Increase current warfarin dose to 1 tablet daily but 1.5 tablets on Tuesdays and Thursdays. However, for today only, patient will take 2 tablets to hopefully get her to therapeutic range.  Provided education on warfarin, including bleeding risk, s/sx of bleeding, s/sx of clot, and impact of vitamin K foods on warfarin and INR.   Patient verbalized understanding and was provided with written instructions. Next INR check planned for 1 week. Will follow up with Dr. Janne Napoleon to see if patient can get orders for INR at day surgery.

## 2017-01-08 NOTE — Patient Instructions (Signed)
Vitamin K Foods and Warfarin Warfarin is a blood thinner (anticoagulant). Anticoagulant medicines help prevent the formation of blood clots. These medicines work by decreasing the activity of vitamin K, which promotes normal blood clotting. When you take warfarin, problems can occur from suddenly increasing or decreasing the amount of vitamin K that you eat from one day to the next. Problems may include:  Blood clots.  Bleeding. What general guidelines do I need to follow? To avoid problems when taking warfarin:  Eat a balanced diet that includes:  Fresh fruits and vegetables.  Whole grains.  Low-fat dairy products.  Lean proteins, such as fish, eggs, and lean cuts of meat.  Keep your intake of vitamin K consistent from day to day. To do this:  Avoid eating large amounts of vitamin K one day and low amounts of vitamin K the next day.  If you take a multivitamin that contains vitamin K, be sure to take it every day.  Know which foods contain vitamin K. Use the lists below to understand serving sizes and the amount of vitamin K in one serving.  Avoid major changes in your diet. If you are going to change your diet, talk with your health care provider before making changes.  Work with a nutrition specialist (dietitian) to develop a meal plan that works best for you. High vitamin K foods Foods that are high in vitamin K contain more than 100 mcg (micrograms) per serving. These include:  Broccoli (cooked) -  cup has 110 mcg.  Brussels sprouts (cooked) -  cup has 109 mcg.  Greens, beet (cooked) -  cup has 350 mcg.  Greens, collard (cooked) -  cup has 418 mcg.  Greens, turnip (cooked) -  cup has 265 mcg.  Green onions or scallions -  cup has 105 mcg.  Kale (fresh or frozen) -  cup has 531 mcg.  Parsley (raw) - 10 sprigs has 164 mcg.  Spinach (cooked) -  cup has 444 mcg.  Swiss chard (cooked) -  cup has 287 mcg. Moderate vitamin K foods Foods that have a  moderate amount of vitamin K contain 25-100 mcg per serving. These include:  Asparagus (cooked) - 5 spears have 38 mcg.  Black-eyed peas (dried) -  cup has 32 mcg.  Cabbage (cooked) -  cup has 37 mcg.  Kiwi fruit - 1 medium has 31 mcg.  Lettuce - 1 cup has 57-63 mcg.  Okra (frozen) -  cup has 44 mcg.  Prunes (dried) - 5 prunes have 25 mcg.  Watercress (raw) - 1 cup has 85 mcg. Low vitamin K foods Foods low in vitamin K contain less than 25 mcg per serving. These include:  Artichoke - 1 medium has 18 mcg.  Avocado - 1 oz. has 6 mcg.  Blueberries -  cup has 14 mcg.  Cabbage (raw) -  cup has 21 mcg.  Carrots (cooked) -  cup has 11 mcg.  Cauliflower (raw) -  cup has 11 mcg.  Cucumber with peel (raw) -  cup has 9 mcg.  Grapes -  cup has 12 mcg.  Mango - 1 medium has 9 mcg.  Nuts - 1 oz. has 15 mcg.  Pear - 1 medium has 8 mcg.  Peas (cooked) -  cup has 19 mcg.  Pickles - 1 spear has 14 mcg.  Pumpkin seeds - 1 oz. has 13 mcg.  Sauerkraut (canned) -  cup has 16 mcg.  Soybeans (cooked) -  cup has 16 mcg.    Tomato (raw) - 1 medium has 10 mcg.  Tomato sauce -  cup has 17 mcg. Vitamin K-free foods If a food contain less than 5 mcg per serving, it is considered to have no vitamin K. These foods include:  Bread and cereal products.  Cheese.  Eggs.  Fish and shellfish.  Meat and poultry.  Milk and dairy products.  Sunflower seeds. Actual amounts of vitamin K in foods may be different depending on processing. Talk with your dietitian about what foods you can eat and what foods you should avoid. This information is not intended to replace advice given to you by your health care provider. Make sure you discuss any questions you have with your health care provider. Document Released: 06/23/2009 Document Revised: 03/17/2016 Document Reviewed: 11/29/2015 Elsevier Interactive Patient Education  2017 [place].   Bleeding Precautions When on  Anticoagulant Therapy WHAT IS ANTICOAGULANT THERAPY? Anticoagulant therapy is taking medicine to prevent or reduce blood clots. It is also called blood thinner therapy. Blood clots that form in your blood vessels can be dangerous. They can break loose and travel to your heart, lungs, or brain. This increases your risk of a heart attack or stroke. Anticoagulant therapy causes blood to clot more slowly. You may need anticoagulant therapy if you have:  A medical condition that increases the likelihood that blood clots will form.  A heart defect or a problem with heart rhythm. It is also a common treatment after heart surgery, such as valve replacement. WHAT ARE COMMON TYPES OF ANTICOAGULANT THERAPY? Anticoagulant medicine can be injected or taken by mouth.If you need anticoagulant therapy quickly at the hospital, the medicine may be injected under your skin or given through an IV tube. Heparin is a common example of an anticoagulant that you may get at the hospital. Most anticoagulant therapy is in the form of pills that you take at home every day. These may include:  Aspirin. This common blood thinner works by preventing blood cells (platelets) from sticking together to form a clot. Aspirin is not as strong as anticoagulants that slow down the time that it takes for your body to form a clot.  Clopidogrel. This is a newer type of drug that affects platelets. It is stronger than aspirin.  Warfarin. This is the most common anticoagulant. It changes the way your body uses vitamin K, a vitamin that helps your blood to clot. The risk of bleeding is higher with warfarin than with aspirin. You will need frequent blood tests to make sure you are taking the safest amount.  New anticoagulants. Several new drugs have been approved. They are all taken by mouth. Studies show that these drugs work as well as warfarin. They do not require blood testing. They may cause less bleeding risk than warfarin. WHAT DO I  NEED TO REMEMBER WHEN TAKING ANTICOAGULANT THERAPY? Anticoagulant therapy decreases your risk of forming a blood clot, but it increases your risk of bleeding. Work closely with your health care provider to make sure you are taking your medicine safely. These tips can help:  Learn ways to reduce your risk of bleeding.  If you are taking warfarin:  Have blood tests as ordered by your health care provider.  Do not make any sudden changes to your diet. Vitamin K in your diet can make warfarin less effective.  Do not get pregnant. This medicine may cause birth defects.  Take your medicine at the same time every day. If you forget to take your medicine, take  it as soon as you remember. If you miss a whole day, do not double your dose of medicine. Take your normal dose and call your health care provider to check in.  Do not stop taking your medicine on your own.  Tell your health care provider before you start taking any new medicine, vitamin, or herbal product. Some of these could interfere with your therapy.  Tell all of your health care providers that you are on anticoagulant therapy.  Do not have surgery, medical procedures, or dental work until you tell your health care provider that you are on anticoagulant therapy. WHAT CAN AFFECT HOW ANTICOAGULANTS WORK? Certain foods, vitamins, medicines, supplements, and herbal medicines change the way that anticoagulant therapy works. They may increase or decrease the effects of your anticoagulant therapy. Either result can be dangerous for you.  Many over-the-counter medicines for pain, colds, or stomach problems interfere with anticoagulant therapy. Take these only as told by your health care provider.  Do not drink alcohol. It can interfere with your medicine and increase your risk of an injury that causes bleeding.  If you are taking warfarin, do not begin eating more foods that contain vitamin K. These include leafy green vegetables. Ask your  health care provider if you should avoid any foods. WHAT ARE SOME WAYS TO PREVENT BLEEDING? You can prevent bleeding by taking certain precautions:  Be extra careful when you use knives, scissors, or other sharp objects.  Use an electric razor instead of a blade.  Do not use toothpicks.  Use a soft toothbrush.  Wear shoes that have nonskid soles.  Use bath mats and handrails in your bathroom.  Wear gloves while you do yard work.  Wear a helmet when you ride a bike.  Wear your seat belt.  Prevent falls by removing loose rugs and extension cords from areas where you walk.  Do not play contact sports or participate in other activities that have a high risk of injury. [place] PROVIDER? Call your health care provider if:  You miss a dose of medicine:  And you are not sure what to do.  For more than one day.  You have:  Menstrual bleeding that is heavier than normal.  Blood in your urine.  A bloody nose or bleeding gums.  Easy bruising.  Blood in your stool (feces) or have black and tarry stool.  Side effects from your medicine.  You feel weak or dizzy.  You become pregnant. Seek immediate medical care if:  You have bleeding that will not stop.  You have sudden and severe headache or belly pain.  You vomit or you cough up bright red blood.  You have a severe blow to your head. WHAT ARE SOME QUESTIONS TO ASK MY HEALTH CARE PROVIDER?  What is the best anticoagulant therapy for my condition?  What side effects should I watch for?  When should I take my medicine? What should I do if I forget to take it?  Will I need to have regular blood tests?  Do I need to change my diet? Are there foods or drinks that I should avoid?  What activities are safe for me?  What should I do if I want to get pregnant? This information is not intended to replace advice given to you by your health care provider. Make sure you discuss any  questions you have with your health care provider. Document Released: 08/07/2015 Document Reviewed: 08/07/2015 Elsevier Interactive Patient Education  2017 [place] You Need to Know About Warfarin Warfarin is a blood thinner (anticoagulant). Anticoagulants help to prevent the formation of blood clots. They also help to stop the growth of blood clots. Who should use warfarin? Warfarin is prescribed for people who are at risk for developing harmful blood clots, such as people who have:  Surgically implanted mechanical heart valves.  Irregular heart rhythms (atrial fibrillation).  Certain clotting disorders.  A history of harmful blood clotting in the past. This includes people who have had:  A stroke.  Blood clot in the lungs (pulmonary embolism, or PE).  Blood clot in the legs (deep vein thrombosis, or DVT).  An existing blood clot. How is warfarin taken?   Warfarin is a medicine that you take by mouth (orally). Warfarin tablets come in different strengths. Each tablet strength is a different color, with the amount of warfarin printed on the tablet. If you get a new prescription filled and the color of your tablet is different than usual, tell your pharmacist or health care provider immediately. What blood tests do I need while taking warfarin? The goal of warfarin therapy is to lessen the clotting tendency of blood, but not to prevent clotting completely. Your health care provider will monitor the anticoagulation effect of warfarin closely and will adjust your dose as needed. Warfarin is a medicine that needs to be closely monitored, so it is very important to keep all lab visits and follow-up visits with your health care provider. While taking warfarin, you will need to have blood tests (prothrombin tests, or PT tests) regularly to measure your blood clotting time. This type of test can be done with a finger stick or a blood draw. What does the INR test result mean?  The  PT test results will be reported as the International Normalized Ratio (INR). The INR tells your health care provider whether your dosage of warfarin needs to be changed. The longer it takes your blood to clot, the higher the INR. Your health care provider will tell you your target INR range. If your INR is not in your target range, your health care provider may adjust your dosage.  If your INR is above your target range, there is a risk of bleeding. Your dosage of warfarin may need to be decreased.  If your INR is below your target range, there is a risk of clotting. Your dosage of warfarin may need to be increased. How often is the INR test needed?   When you first start warfarin, you will usually have your INR checked every few days.  You may need to have INR tests done more than once a week until you are taking the correct dosage of warfarin.  After you have reached your target INR, your INR will be tested less often. However, you will need to have your INR checked at least once every 4-6 weeks for the entire time you are taking warfarin. What are the side effects of warfarin? Too much warfarin can cause bleeding (hemorrhage) in any part of the body, such as:  Bleeding from the gums.  Unexplained bruises.  Bruises that get larger.  Blood in the urine.  Bloody or dark stools.  Bleeding in the brain (hemorrhagic stroke).  A nosebleed that is not easily stopped.  Coughing up blood.  Vomiting blood. Warfarin use may also cause:  Skin rash or irritations  Nausea that does not go away.  Severe pain in the back or joints.  Painful toes that turn blue or purple (purple toe syndrome).  Painful ulcers that do not go away (skin necrosis). What are the signs and symptoms of a blood clot? Too little warfarin can increase the risk of blood clots in your legs, lungs, or arms. Signs and symptoms of a DVT in your leg or arm may include:  Pain or swelling in your leg or arm.  Skin  that is red or warm to the touch on your arm or leg. Signs and symptoms of a pulmonary embolism may include:  Shortness of breath or difficulty breathing.  Chest pain.  Unexplained fever. What are the signs and symptoms of a stroke? If you are taking too much or too little warfarin, you can have a stroke. Signs and symptoms of a stroke may include:  Weakness or numbness of your face, arm, or leg, especially on one side of your body.  Confusion or trouble thinking clearly.  Difficulty seeing with one or both eyes.  Difficulty walking or moving your arms or legs.  Dizziness.  Loss of balance or coordination.  Trouble speaking, trouble understanding speech, or both (aphasia).  Sudden, severe headache with no known cause.  Partial or total loss of consciousness. What precautions do I need to take while using warfarin?   Take warfarin exactly as told by your health care provider. Doing this helps you avoid bleeding or blood clots that could result in serious injury, pain, or disability.  Take your medicine at the same time every day. If you forget to take your dose of warfarin, take it as soon as you remember that day. If you do not remember on that day, do not take an extra dose the next day.  Contact your health care provider if you miss or take an extra dose. Do not change your dosage on your own to make up for missed or extra doses.  Wear or carry identification that says that you are taking warfarin.  Make sure that all health care providers, including your dentist, know you are taking warfarin.  If you need surgery, talk with your health care provider about whether you should stop taking warfarin before your surgery.  Avoid situations that cause bleeding. You may bleed more easily while taking warfarin. To limit bleeding, take the following actions:  Use a softer toothbrush.  Floss with waxed floss, not unwaxed floss.  Shave with an electric razor, not with a  blade.  Limit your use of sharp objects.  Avoid potentially harmful activities, such as contact sports. What do I need to know about warfarin and pregnancy or breastfeeding?  Warfarin is not recommended during the first trimester of pregnancy due to an increased risk of birth defects. In certain situations, a woman may take warfarin after her first trimester of pregnancy.  If you are taking warfarin and you become pregnant or plan to become pregnant, contact your health care provider right away.  If you plan to breastfeed while taking warfarin, talk with your health care provider first. What do I need to know about warfarin and alcohol or drug use?  Avoid drinking alcohol, or limit alcohol intake to no more than 1 drink a day for nonpregnant women and 2 drinks a day for men. One drink equals 12 oz of beer, 5 oz of wine, or 1 oz of hard liquor.  If you change the amount of alcohol that you drink, tell your health care provider. Your warfarin dosage may need to be changed.  Avoid street drugs while taking warfarin. The effects of street drugs on warfarin are not known. What do I need to know about warfarin and other medicines or supplements?  Many prescription and over-the-counter medicines can interfere with warfarin. Talk with your health care provider or your pharmacist before starting or stopping any new medicines. This includes over-the-counter vitamins, dietary supplements, herbal medicines, and pain medicines. Your warfarin dosage may need to be adjusted.  Some common over-the-counter medicines that may increase the risk of bleeding while taking warfarin include:  Acetaminophen.    NSAIDs, such as ibuprofen or naproxen.  Vitamin E. What do I need to know about warfarin and my diet?  It is important to maintain a normal, balanced diet while taking warfarin. Avoid major changes in your diet. If you are going to change your diet, talk with your health care provider before  making changes.  Your health care provider may recommend that you work with a diet and nutrition specialist (dietitian).  Vitamin K decreases the effect of warfarin, and it is found in many foods. Eat a consistent amount of foods that contain vitamin K. For example, you may decide to eat 2 vitamin K-containing foods each day. Most foods that are high in vitamin K are green and leafy. Common foods that contain high amounts of vitamin K include:  Kale, raw or cooked.  Spinach, raw or cooked.  Collards, raw or cooked.  Swiss chard, raw or cooked.  Mustard greens, raw or cooked.  Turnip greens, raw or cooked.  Parsley, raw.  Broccoli, cooked.  Noodles, eggs, and spinach, enriched.  Brussels sprouts, raw or cooked.  Beet greens, raw or cooked.  Endive, raw.  Cabbage, cooked.  Asparagus, cooked. Foods that contain moderate amounts of vitamin K include:  Broccoli, raw.  Cabbage, raw.  Bok choy, cooked.  Green leaf lettuce, raw  Prunes, stewed.  Angie Fava.  Kiwi.  Edamame, cooked.  Romaine lettuce, raw.  Avocado.  Tuna, canned in oil.  Okra, cooked.  Black-eyed peas, cooked.  Green beans, cooked or raw.  Blueberries, raw.  Blackberries, raw.  Peas, cooked or raw. Contact a health care provider if:  You miss a dose.  You take an extra dose.  You plan to have any kind of surgery or procedure.  You are unable to take your medicine due to nausea, vomiting, or diarrhea.  You have any major changes in your diet or you plan to make any major changes in your diet.  You start or stop any over-the-counter medicine, prescription medicine, or dietary supplement.  You become pregnant, plan to become pregnant, or think you may be pregnant.  You have menstrual periods that are heavier than usual.  You have unusual bruising. Get help right away if:  You develop symptoms of an allergic reaction, such as:  Swelling of the lips, face, tongue, mouth, or  throat.  Rash.  Itching.  Itchy, red, swollen areas of skin (hives).  Trouble breathing.  Chest tightness.  You have:  Signs or symptoms of a stroke.  Signs or symptoms of a blood clot.  A fall or have an accident, especially if you hit your head.  Blood in your urine. Your urine may look reddish, pinkish, or tea-colored.  Blood in your stool. Your stool may be black or bright red.  Bleeding that does not stop after applying pressure to the area for 30 minutes.  Severe pain in your joints or back.  Purple or blue toes.  Skin ulcers  that do not go away.  You vomit blood or cough up blood. The blood may be bright red, or it may look like coffee grounds. These symptoms may represent a serious problem that is an emergency. Do not wait to see if the symptoms will go away. Get medical help right away. Call your local emergency services (911 in the [place] Do not drive yourself to the hospital. Summary  Warfarin needs to be closely monitored with blood tests. It is very important to keep all lab visits and follow-up visits with your health care provider.  Make sure that you know your target INR range and your warfarin dosage.  Wear or carry identification that says that you are taking warfarin.  Take warfarin at the same time every day. Call your health care provider if you miss a dose or if you take an extra dose. Do not change the dosage of warfarin on your own.  Know the signs and symptoms of blood clots, bleeding, and a stroke. Know when to get emergency medical help.  Tell all health care providers who care for you that you are taking warfarin.  Talk with your health care provider or your pharmacist before starting or stopping any new medicines.  Monitor how much vitamin K you eat every day. Try to eat the same amount every day. This information is not intended to replace advice given to you by your health care provider. Make sure you discuss any questions you have  with your health care provider. Document Released: 08/26/2005 Document Revised: 05/07/2016 Document Reviewed: 11/22/2015 Elsevier Interactive Patient Education  2017 [employer].

## 2017-01-09 ENCOUNTER — Telehealth: Payer: Self-pay | Admitting: Internal Medicine

## 2017-01-09 NOTE — Telephone Encounter (Signed)
[name] [name] from [facility] called the office to speak with PCP in regard to getting verbal orders for portacath flush for patient. It's urgent.

## 2017-01-09 NOTE — Telephone Encounter (Signed)
Returned [facility] call and sent over order for monthly port flush with lab

## 2017-01-10 ENCOUNTER — Other Ambulatory Visit: Payer: Self-pay

## 2017-01-10 NOTE — Patient Outreach (Signed)
[facility]) Care Management  01/10/17  [name] [name] 05/18/1961 [id number]  Attempted to reach patient without success. Left HIPAA compliant voicemail with RNCM contact information and requested callback.  [name] Johnatha Zeidman, RN, BSN, [place] Management Coordinator [phone]

## 2017-01-13 ENCOUNTER — Telehealth (HOSPITAL_COMMUNITY): Payer: Self-pay | Admitting: Pharmacist

## 2017-01-13 ENCOUNTER — Other Ambulatory Visit: Payer: Self-pay

## 2017-01-13 NOTE — Patient Outreach (Signed)
[facility]) Care Management  01/13/17  Lanae Federer [birth year] [id number]  Successful outreach completed with patient briefly as she was getting ready to go out. Patient identification verified.   Patient stated she is doing ok. Has a home health nurse helping her with Adempas medication, scheduled to come tomorrow. She is agreeable to a home visit with RNCM.  Encouraged to call with any questions or concerns prior to home visit.  Plan: Home visit scheduled for later this week.  [name] Kyden Potash, RN, BSN, [place] Management Coordinator [phone]

## 2017-01-13 NOTE — Telephone Encounter (Signed)
Received notification that [facility] RN went to patient's home and educated patient on initiation of Adempas 1 mg TID including potential side effects, management strategies and emergency contact info.   Ruta Hinds. Velva Harman, PharmD, BCPS, CPP Clinical Pharmacist Pager: [phone] Phone: [phone] 01/13/2017 9:34 AM

## 2017-01-15 ENCOUNTER — Encounter: Payer: Self-pay | Admitting: Pharmacist

## 2017-01-15 ENCOUNTER — Telehealth: Payer: Self-pay | Admitting: Pharmacist

## 2017-01-15 NOTE — Telephone Encounter (Signed)
Received call from patient asking if she was ok to get the INR drawn tomorrow at the center when they flush her lines. I told her that Dr. Janne Napoleon had entered the order.  She also reports that she is about to receive assistance with copays and was told that this program would pay for her Xarelto. She would like to know if she can go back on the Xarelto if it is approved by the program. She will find out more tomorrow for sure. She believes it would require a printed script signed by Dr. Janne Napoleon. Instructed patient to call us back when she knows what she needs and that I would share this with Dr. Janne Napoleon.

## 2017-01-16 ENCOUNTER — Ambulatory Visit: Payer: Self-pay

## 2017-01-16 ENCOUNTER — Encounter (HOSPITAL_COMMUNITY): Payer: Self-pay

## 2017-01-16 ENCOUNTER — Other Ambulatory Visit: Payer: Self-pay

## 2017-01-16 ENCOUNTER — Ambulatory Visit (HOSPITAL_COMMUNITY)
Admission: RE | Admit: 2017-01-16 | Discharge: 2017-01-16 | Disposition: A | Discharge status: Home / Self Care | Payer: Medicare HMO | Source: Ambulatory Visit | Attending: Internal Medicine | Admitting: Internal Medicine

## 2017-01-16 DIAGNOSIS — I2782 Chronic pulmonary embolism: Secondary | ICD-10-CM | POA: Insufficient documentation

## 2017-01-16 LAB — PROTIME-INR
INR: 1.81
Prothrombin Time: 21.2 seconds — ABNORMAL HIGH (ref 11.4–15.2)

## 2017-01-16 MED ORDER — ALTEPLASE 2 MG IJ SOLR
2.0000 mg | Freq: Once | INTRAMUSCULAR | Status: AC
  Administered 2017-01-16: 2 mg
  Filled 2017-01-16: qty 2

## 2017-01-16 MED ORDER — SODIUM CHLORIDE 0.9% FLUSH
10.0000 mL | INTRAVENOUS | Status: DC
  Administered 2017-01-16: 10 mL via INTRAVENOUS

## 2017-01-16 MED ORDER — HEPARIN SOD (PORK) LOCK FLUSH 100 UNIT/ML IV SOLN
500.0000 [IU] | INTRAVENOUS | Status: DC
  Administered 2017-01-16: 500 [IU]
  Filled 2017-01-16: qty 5

## 2017-01-16 NOTE — Progress Notes (Signed)
Pt here for port flush and labwork, protime/INR.  Accessed port x2 w/o difficulty, flushed well, pt could taste saline, but no blood return.  IV team was then called, they accessed port also with no blood return.  So IV team instilled tpa into port for it to dwell for a while.  Pt was informed of how long she may be here, tpa instillation can take about 2 hours.  Pt was ok with this.  Offered to go ahead and draw protime/INR peripherally but pt politely declined, stating I don't want to be stuck in my arm.  IV team will return in about 1 hour to reaccess port.  Told pt if we have no success today, we will let her go home and speak with the office in the morning.

## 2017-01-16 NOTE — Progress Notes (Signed)
IV team accessed after tpa in for about 1 hr. 48min. And no blood return at 1635.   Went in at 1641 to flush and deaccess port and blood return obtained.  Flushed well and drew off 72ml waste because several things flushed through port today.  PT/INR drawn and sent to lab.  Pt very happy.  Dripped some blood on pt's arm, but cleaned off with washcloth.   Port was flushed with 43ml NS and 43ml heparin after obtaining labwork and deaccessed.  Gauze dressing applied to site.  Pt was given several snacks while in short stay.  Offered wheelchair to lobby, but pt declined stating she needed to walk.  D/c ambulatory to lobby with cane.  No lightheadedness or dizziness.

## 2017-01-17 NOTE — Patient Outreach (Signed)
[facility]) Care Management  Late entry for 01/16/2017  Lamiyah Schlotter [birth year] [id number]  RNCM had a home visit scheduled with patient. However, patient was not at home. RNCM spoke with patient's husband on the phone who stated that she had an appointment to get her port flushed and expected to be back at home by 3:30 pm. Home visit rescheduled for 3:30 pm.  RNCM received a call around 3:20 pm stating that patient will be delayed until after 5 pm. Will reschedule home visit at another time when can talk with patient.  [name] Ripken Rekowski, RN, BSN, [place] Management Coordinator [phone]

## 2017-01-20 NOTE — Telephone Encounter (Signed)
    Pharmacy Anticoagulation Clinic  Subjective: Called patient today with INR result from lab draw for INR monitoring. 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   Current dose of warfarin: 5 mg daily except 7.5 mg on Tuesdays and Thursdays.  Adherence to warfarin: denies any missed doses Signs/symptoms of bleeding: reports some minor bruising but denies any bleeding Recent changes in diet: denies Recent changes in medications: denies   Objective: Today's INR = 1.81  Lab Results  Component Value Date   INR 1.81 01/16/2017   INR 1.6 01/08/2017   INR 1.34 11/12/2015     Assessment and Plan: Anticoagulation: Patient is Subtherapeutic based on patient's INR of 1.8 and patient's INR goal of 2-3. Will Increase current warfarin dose to 1 tablet daily but 2 tablets on Tuesdays and Thursdays. However, for today and tomorrow only, patient will take 2 tablets today and 1 tablet tomorrow.  Patient verbalized understanding and was provided with written instructions. Will follow up with Dr. Janne Napoleon for switching back to Xarelto. Next INR check in 1 week at physician appt.

## 2017-01-23 ENCOUNTER — Encounter: Payer: Self-pay | Admitting: Internal Medicine

## 2017-01-27 ENCOUNTER — Encounter: Payer: Self-pay | Admitting: Internal Medicine

## 2017-01-27 ENCOUNTER — Ambulatory Visit: Payer: Medicare HMO | Attending: Internal Medicine | Admitting: Internal Medicine

## 2017-01-27 VITALS — BP 154/84 | HR 83 | Temp 98.4°F | Resp 16 | Wt 323.0 lb

## 2017-01-27 DIAGNOSIS — I2782 Chronic pulmonary embolism: Secondary | ICD-10-CM | POA: Diagnosis not present

## 2017-01-27 DIAGNOSIS — Z79899 Other long term (current) drug therapy: Secondary | ICD-10-CM | POA: Insufficient documentation

## 2017-01-27 DIAGNOSIS — I2699 Other pulmonary embolism without acute cor pulmonale: Secondary | ICD-10-CM | POA: Diagnosis not present

## 2017-01-27 DIAGNOSIS — I10 Essential (primary) hypertension: Secondary | ICD-10-CM | POA: Diagnosis not present

## 2017-01-27 DIAGNOSIS — Z0001 Encounter for general adult medical examination with abnormal findings: Secondary | ICD-10-CM | POA: Diagnosis not present

## 2017-01-27 DIAGNOSIS — Z6841 Body Mass Index (BMI) 40.0 and over, adult: Secondary | ICD-10-CM | POA: Diagnosis not present

## 2017-01-27 DIAGNOSIS — Z7901 Long term (current) use of anticoagulants: Secondary | ICD-10-CM | POA: Insufficient documentation

## 2017-01-27 DIAGNOSIS — Z9989 Dependence on other enabling machines and devices: Secondary | ICD-10-CM

## 2017-01-27 DIAGNOSIS — I2724 Chronic thromboembolic pulmonary hypertension: Secondary | ICD-10-CM | POA: Diagnosis not present

## 2017-01-27 DIAGNOSIS — K219 Gastro-esophageal reflux disease without esophagitis: Secondary | ICD-10-CM | POA: Diagnosis not present

## 2017-01-27 DIAGNOSIS — R739 Hyperglycemia, unspecified: Secondary | ICD-10-CM | POA: Diagnosis not present

## 2017-01-27 DIAGNOSIS — G4733 Obstructive sleep apnea (adult) (pediatric): Secondary | ICD-10-CM | POA: Diagnosis not present

## 2017-01-27 DIAGNOSIS — F4321 Adjustment disorder with depressed mood: Secondary | ICD-10-CM

## 2017-01-27 LAB — GLUCOSE, POCT (MANUAL RESULT ENTRY): POC Glucose: 108 mg/dl — AB (ref 70–99)

## 2017-01-27 MED ORDER — RIVAROXABAN 20 MG PO TABS: 20.0000 mg | ORAL_TABLET | Freq: Every day | ORAL | 3 refills | Status: DC

## 2017-01-27 MED ORDER — ACETAMINOPHEN-CODEINE #3 300-30 MG PO TABS: 1.0000 | ORAL_TABLET | Freq: Three times a day (TID) | ORAL | 0 refills | Status: DC | PRN

## 2017-01-27 MED ORDER — OMEPRAZOLE 40 MG PO CPDR: 40.0000 mg | DELAYED_RELEASE_CAPSULE | Freq: Every day | ORAL | 3 refills | Status: DC

## 2017-01-27 MED ORDER — FLUTICASONE PROPIONATE 50 MCG/ACT NA SUSP: 2.0000 | Freq: Every day | NASAL | 6 refills | Status: DC

## 2017-01-27 MED ORDER — FUROSEMIDE 40 MG PO TABS: 40.0000 mg | ORAL_TABLET | Freq: Every day | ORAL | 3 refills | Status: DC

## 2017-01-27 MED ORDER — LORATADINE 10 MG PO TABS: 10.0000 mg | ORAL_TABLET | Freq: Every day | ORAL | 11 refills | Status: DC

## 2017-01-27 MED ORDER — GABAPENTIN 300 MG PO CAPS: 300.0000 mg | ORAL_CAPSULE | Freq: Three times a day (TID) | ORAL | 1 refills | Status: DC

## 2017-01-27 MED ORDER — ATORVASTATIN CALCIUM 20 MG PO TABS: 20.0000 mg | ORAL_TABLET | Freq: Every day | ORAL | 2 refills | Status: DC

## 2017-01-27 MED ORDER — METOPROLOL TARTRATE 25 MG PO TABS: 25.0000 mg | ORAL_TABLET | Freq: Two times a day (BID) | ORAL | 3 refills | Status: DC

## 2017-01-27 NOTE — Patient Instructions (Addendum)
- needs to set up w/ new PCP - should be MD (not pa/np due to pt complications.)- wants same doctor as husband.  - - STOP COUMADIN WHEN START XARELTO.   -   Generalized Anxiety Disorder, Adult Generalized anxiety disorder (GAD) is a mental health disorder. People with this condition constantly worry about everyday events. Unlike normal anxiety, worry related to GAD is not triggered by a specific event. These worries also do not fade or get better with time. GAD interferes with life functions, including relationships, work, and school. GAD can vary from mild to severe. People with severe GAD can have intense waves of anxiety with physical symptoms (panic attacks). What are the causes? The exact cause of GAD is not known. What increases the risk? This condition is more likely to develop in:  Women.  People who have a family history of anxiety disorders.  People who are very shy.  People who experience very stressful life events, such as the death of a loved one.  People who have a very stressful family environment. What are the signs or symptoms? People with GAD often worry excessively about many things in their lives, such as their health and family. They may also be overly concerned about:  Doing well at work.  Being on time.  Natural disasters.  Friendships. Physical symptoms of GAD include:  Fatigue.  Muscle tension or having muscle twitches.  Trembling or feeling shaky.  Being easily startled.  Feeling like your heart is pounding or racing.  Feeling out of breath or like you cannot take a deep breath.  Having trouble falling asleep or staying asleep.  Sweating.  Nausea, diarrhea, or irritable bowel syndrome (IBS).  Headaches.  Trouble concentrating or remembering facts.  Restlessness.  Irritability. How is this diagnosed? Your health care provider can diagnose GAD based on your symptoms and medical history. You will also have a physical exam. The  health care provider will ask specific questions about your symptoms, including how severe they are, when they started, and if they come and go. Your health care provider may ask you about your use of alcohol or drugs, including prescription medicines. Your health care provider may refer you to a mental health specialist for further evaluation. Your health care provider will do a thorough examination and may perform additional tests to rule out other possible causes of your symptoms. To be diagnosed with GAD, a person must have anxiety that:  Is out of his or her control.  Affects several different aspects of his or her life, such as work and relationships.  Causes distress that makes him or her unable to take part in normal activities.  Includes at least three physical symptoms of GAD, such as restlessness, fatigue, trouble concentrating, irritability, muscle tension, or sleep problems. Before your health care provider can confirm a diagnosis of GAD, these symptoms must be present more days than they are not, and they must last for six months or longer. How is this treated? The following therapies are usually used to treat GAD:  Medicine. Antidepressant medicine is usually prescribed for long-term daily control. Antianxiety medicines may be added in severe cases, especially when panic attacks occur.  Talk therapy (psychotherapy). Certain types of talk therapy can be helpful in treating GAD by providing support, education, and guidance. Options include:  Cognitive behavioral therapy (CBT). People learn coping skills and techniques to ease their anxiety. They learn to identify unrealistic or negative thoughts and behaviors and to replace them with positive  ones.  Acceptance and commitment therapy (ACT). This treatment teaches people how to be mindful as a way to cope with unwanted thoughts and feelings.  Biofeedback. This process trains you to manage your body's response (physiological  response) through breathing techniques and relaxation methods. You will work with a therapist while machines are used to monitor your physical symptoms.  Stress management techniques. These include yoga, meditation, and exercise. A mental health specialist can help determine which treatment is best for you. Some people see improvement with one type of therapy. However, other people require a combination of therapies. Follow these instructions at home:  Take over-the-counter and prescription medicines only as told by your health care provider.  Try to maintain a normal routine.  Try to anticipate stressful situations and allow extra time to manage them.  Practice any stress management or self-calming techniques as taught by your health care provider.  Do not punish yourself for setbacks or for not making progress.  Try to recognize your accomplishments, even if they are small.  Keep all follow-up visits as told by your health care provider. This is important. Contact a health care provider if:  Your symptoms do not get better.  Your symptoms get worse.  You have signs of depression, such as:  A persistently sad, cranky, or irritable mood.  Loss of enjoyment in activities that used to bring you joy.  Change in weight or eating.  Changes in sleeping habits.  Avoiding friends or family members.  Loss of energy for normal tasks.  Feelings of guilt or worthlessness. Get help right away if:  You have serious thoughts about hurting yourself or others. If you ever feel like you may hurt yourself or others, or have thoughts about taking your own life, get help right away. You can go to your nearest emergency department or call:  Your local emergency services (911 in the [place]  A suicide crisis helpline, such as the [facility] at (804)410-6304. This is open 24 hours a day. Summary  Generalized anxiety disorder (GAD) is a mental health disorder that  involves worry that is not triggered by a specific event.  People with GAD often worry excessively about many things in their lives, such as their health and family.  GAD may cause physical symptoms such as restlessness, trouble concentrating, sleep problems, frequent sweating, nausea, diarrhea, headaches, and trembling or muscle twitching.  A mental health specialist can help determine which treatment is best for you. Some people see improvement with one type of therapy. However, other people require a combination of therapies. This information is not intended to replace advice given to you by your health care provider. Make sure you discuss any questions you have with your health care provider. Document Released: 12/21/2012 Document Revised: 07/16/2016 Document Reviewed: 07/16/2016 Elsevier Interactive Patient Education  2017 [employer] Inc[employer]  -   Low-Sodium Eating Plan Sodium, which is an element that makes up salt, helps you maintain a healthy balance of fluids in your body. Too much sodium can increase your blood pressure and cause fluid and waste to be held in your body. Your health care provider or dietitian may recommend following this plan if you have high blood pressure (hypertension), kidney disease, liver disease, or heart failure. Eating less sodium can help lower your blood pressure, reduce swelling, and protect your heart, liver, and kidneys. What are tips for following this plan? General guidelines   Most people on this plan should limit their sodium intake to 1,500-2,000  mg (milligrams) of sodium each day. Reading food labels   The Nutrition Facts label lists the amount of sodium in one serving of the food. If you eat more than one serving, you must multiply the listed amount of sodium by the number of servings.  Choose foods with less than 140 mg of sodium per serving.  Avoid foods with 300 mg of sodium or more per serving. Shopping   Look for lower-sodium products,  often labeled as "low-sodium" or "no salt added."  Always check the sodium content even if foods are labeled as "unsalted" or "no salt added".  Buy fresh foods.  Avoid canned foods and premade or frozen meals.  Avoid canned, cured, or processed meats  Buy breads that have less than 80 mg of sodium per slice. Cooking   Eat more home-cooked food and less restaurant, buffet, and fast food.  Avoid adding salt when cooking. Use salt-free seasonings or herbs instead of table salt or sea salt. Check with your health care provider or pharmacist before using salt substitutes.  Cook with plant-based oils, such as canola, sunflower, or olive oil. Meal planning   When eating at a restaurant, ask that your food be prepared with less salt or no salt, if possible.  Avoid foods that contain MSG (monosodium glutamate). MSG is sometimes added to [place] food, bouillon, and some canned foods. What foods are recommended? The items listed may not be a complete list. Talk with your dietitian about what dietary choices are best for you. Grains  Low-sodium cereals, including oats, puffed wheat and rice, and shredded wheat. Low-sodium crackers. Unsalted rice. Unsalted pasta. Low-sodium bread. Whole-grain breads and whole-grain pasta. Vegetables  Fresh or frozen vegetables. "No salt added" canned vegetables. "No salt added" tomato sauce and paste. Low-sodium or reduced-sodium tomato and vegetable juice. Fruits  Fresh, frozen, or canned fruit. Fruit juice. Meats and other protein foods  Fresh or frozen (no salt added) meat, poultry, seafood, and fish. Low-sodium canned tuna and salmon. Unsalted nuts. Dried peas, beans, and lentils without added salt. Unsalted canned beans. Eggs. Unsalted nut butters. Dairy  Milk. Soy milk. Cheese that is naturally low in sodium, such as ricotta cheese, fresh mozzarella, or Swiss cheese Low-sodium or reduced-sodium cheese. Cream cheese. Yogurt. Fats and oils  Unsalted  butter. Unsalted margarine with no trans fat. Vegetable oils such as canola or olive oils. Seasonings and other foods  Fresh and dried herbs and spices. Salt-free seasonings. Low-sodium mustard and ketchup. Sodium-free salad dressing. Sodium-free light mayonnaise. Fresh or refrigerated horseradish. Lemon juice. Vinegar. Homemade, reduced-sodium, or low-sodium soups. Unsalted popcorn and pretzels. Low-salt or salt-free chips. What foods are not recommended? The items listed may not be a complete list. Talk with your dietitian about what dietary choices are best for you. Grains  Instant hot cereals. Bread stuffing, pancake, and biscuit mixes. Croutons. Seasoned rice or pasta mixes. Noodle soup cups. Boxed or frozen macaroni and cheese. Regular salted crackers. Self-rising flour. Vegetables  Sauerkraut, pickled vegetables, and relishes. Olives. [place] fries. Onion rings. Regular canned vegetables (not low-sodium or reduced-sodium). Regular canned tomato sauce and paste (not low-sodium or reduced-sodium). Regular tomato and vegetable juice (not low-sodium or reduced-sodium). Frozen vegetables in sauces. Meats and other protein foods  Meat or fish that is salted, canned, smoked, spiced, or pickled. Bacon, ham, sausage, hotdogs, corned beef, chipped beef, packaged lunch meats, salt pork, jerky, pickled herring, anchovies, regular canned tuna, sardines, salted nuts. Dairy  Processed cheese and cheese spreads. Cheese curds. Blue  cheese. Feta cheese. String cheese. Regular cottage cheese. Buttermilk. Canned milk. Fats and oils  Salted butter. Regular margarine. Ghee. Bacon fat. Seasonings and other foods  Onion salt, garlic salt, seasoned salt, table salt, and sea salt. Canned and packaged gravies. Worcestershire sauce. Tartar sauce. Barbecue sauce. Teriyaki sauce. Soy sauce, including reduced-sodium. Steak sauce. Fish sauce. Oyster sauce. Cocktail sauce. Horseradish that you find on the shelf. Regular  ketchup and mustard. Meat flavorings and tenderizers. Bouillon cubes. Hot sauce and Tabasco sauce. Premade or packaged marinades. Premade or packaged taco seasonings. Relishes. Regular salad dressings. Salsa. Potato and tortilla chips. Corn chips and puffs. Salted popcorn and pretzels. Canned or dried soups. Pizza. Frozen entrees and pot pies. Summary  Eating less sodium can help lower your blood pressure, reduce swelling, and protect your heart, liver, and kidneys.  Most people on this plan should limit their sodium intake to 1,500-2,000 mg (milligrams) of sodium each day.  Canned, boxed, and frozen foods are high in sodium. Restaurant foods, fast foods, and pizza are also very high in sodium. You also get sodium by adding salt to food.  Try to cook at home, eat more fresh fruits and vegetables, and eat less fast food, canned, processed, or prepared foods. This information is not intended to replace advice given to you by your health care provider. Make sure you discuss any questions you have with your health care provider. Document Released: 02/15/2002 Document Revised: 08/19/2016 Document Reviewed: 08/19/2016 Elsevier Interactive Patient Education  2017 [place] for Gastroesophageal Reflux Disease, Adult When you have gastroesophageal reflux disease (GERD), the foods you eat and your eating habits are very important. Choosing the right foods can help ease your discomfort. What guidelines do I need to follow?  Choose fruits, vegetables, whole grains, and low-fat dairy products.  Choose low-fat meat, fish, and poultry.  Limit fats such as oils, salad dressings, butter, nuts, and avocado.  Keep a food diary. This helps you identify foods that cause symptoms.  Avoid foods that cause symptoms. These may be different for everyone.  Eat small meals often instead of 3 large meals a day.  Eat your meals slowly, in a place where you are relaxed.  Limit fried  foods.  Cook foods using methods other than frying.  Avoid drinking alcohol.  Avoid drinking large amounts of liquids with your meals.  Avoid bending over or lying down until 2-3 hours after eating. What foods are not recommended? These are some foods and drinks that may make your symptoms worse: Vegetables  Tomatoes. Tomato juice. Tomato and spaghetti sauce. Chili peppers. Onion and garlic. Horseradish. Fruits  Oranges, grapefruit, and lemon (fruit and juice). Meats  High-fat meats, fish, and poultry. This includes hot dogs, ribs, ham, sausage, salami, and bacon. Dairy  Whole milk and chocolate milk. Sour cream. Cream. Butter. Ice cream. Cream cheese. Drinks  Coffee and tea. Bubbly (carbonated) drinks or energy drinks. Condiments  Hot sauce. Barbecue sauce. Sweets/Desserts  Chocolate and cocoa. Donuts. Peppermint and spearmint. Fats and Oils  High-fat foods. This includes [place] fries and potato chips. Other  Vinegar. Strong spices. This includes black pepper, white pepper, red pepper, cayenne, curry powder, cloves, ginger, and chili powder. The items listed above may not be a complete list of foods and drinks to avoid. Contact your dietitian for more information.  This information is not intended to replace advice given to you by your health care provider. Make sure you discuss any questions you have  with your health care provider. Document Released: 02/25/2012 Document Revised: 02/01/2016 Document Reviewed: 06/30/2013 Elsevier Interactive Patient Education  2017 [employer].

## 2017-01-27 NOTE — Progress Notes (Signed)
[name] [name], is a 56 y.o. female  MVH:[id number]  WUX:[id number]  DOB - [birth year]  Chief Complaint  Patient presents with  . URI        Subjective:   [name] [name] is a 56 y.o. 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  Pt has been having nonprod cough, esp at night last few weeks. Maybe runny nose/congestion.  Has not tried anything.  Denies f/c. Has chronic chest pain which is not getting better. Wakes up and goes to sleep w pain.  Lots of stressors at home, sick husband, they are taking care of nephew, who also has psychiatric problems.  Tearful during exam. Doubts psychiatry and therapy helping, does not feel any better. Feels "like she is going to die." no si/no plan/no avh.   Patient has No headache, No abdominal pain - No Nausea, No new weakness tingling or numbness, No Cough - SOB.  No problems updated.  ALLERGIES: No Known Allergies  PAST MEDICAL HISTORY: Past Medical History:  Diagnosis Date  . Arthritis   . Depression   . Diverticulitis   . Hypertension   . Obesity   . Pneumonia   . Pulmonary embolism ([place])     MEDICATIONS AT HOME: Prior to Admission medications   Medication Sig Start Date End Date Taking? Authorizing Provider  acetaminophen-codeine (TYLENOL #3) 300-30 MG tablet Take 1 tablet by mouth every 8 (eight) hours as needed for moderate pain. Max total Tyelnol < 2000mg  /day 01/27/17   Lottie Mussel T, MD  albuterol (PROVENTIL HFA;VENTOLIN HFA) 108 (90 Base) MCG/ACT inhaler Inhale 1-2 puffs into the lungs every 6 (six) hours as needed for wheezing or shortness of breath. 02/08/16   Maryan Puls, MD  atorvastatin (LIPITOR) 20 MG tablet Take 1 tablet (20 mg total) by mouth daily at 6 PM. 01/27/17   Andrzej Scully T, MD   Brexpiprazole (REXULTI) 0.5 MG TABS Take 1 tablet (0.5 mg total) by mouth daily. 01/02/17   Arfeen, Arlyce Harman, MD  buPROPion (WELLBUTRIN XL) 300 MG 24 hr tablet Take 1 tablet (300 mg total) by mouth every morning. 01/02/17 01/02/18  Arfeen, Arlyce Harman, MD  diclofenac (FLECTOR) 1.3 % PTCH Place 1 patch onto the skin 2 (two) times daily. 10/16/16   Bensimhon, Shaune Pascal, MD  diclofenac sodium (VOLTAREN) 1 % GEL Apply 2 g topically 4 (four) times daily. 08/14/16   Maren Reamer, MD  dicyclomine (BENTYL) 20 MG tablet Take 1 tablet (20 mg total) by mouth 2 (two) times daily. 12/11/16   Tresa Garter, MD  docusate sodium (COLACE) 100 MG capsule Take 1 capsule (100 mg total) by mouth every 12 (twelve) hours. 10/24/16   Margarita Mail, PA-C  ferrous sulfate (FERROUSUL) 325 (65 FE) MG tablet Take 1 tablet (325 mg total) by mouth daily with breakfast. 12/25/16   Deionte Spivack T, MD  fluticasone (FLONASE) 50 MCG/ACT nasal spray Place 2 sprays into both nostrils daily. 01/27/17   Maren Reamer, MD  furosemide (LASIX) 40 MG tablet Take 1 tablet (40 mg total) by mouth daily. Take extra 40 mg tablet once in the afternoon AS NEEDED for weight gain 3 lbs or more. 01/27/17   Maren Reamer, MD  gabapentin (NEURONTIN) 300 MG capsule Take 1 capsule (300  mg total) by mouth 3 (three) times daily. 01/27/17 02/26/17  Maren Reamer, MD  guaiFENesin-dextromethorphan (ROBITUSSIN DM) 100-10 MG/5ML syrup Take 5 mLs by mouth every 4 (four) hours as needed for cough. 12/11/16   Tresa Garter, MD  hydrocortisone (ANUSOL-HC) 25 MG suppository Place 1 suppository (25 mg total) rectally 2 (two) times daily. 11/26/16   Maren Reamer, MD  loratadine (CLARITIN) 10 MG tablet Take 1 tablet (10 mg total) by mouth daily. 01/27/17   Maren Reamer, MD  meclizine (ANTIVERT) 25 MG tablet TAKE ONE TABLET BY MOUTH THREE TIMES DAILY AS NEEDED FOR  DIZZINESS 12/23/16   Maren Reamer, MD  metoprolol tartrate (LOPRESSOR) 25 MG tablet  Take 1 tablet (25 mg total) by mouth 2 (two) times daily. 01/27/17   Maren Reamer, MD  omeprazole (PRILOSEC) 40 MG capsule Take 1 capsule (40 mg total) by mouth daily. 01/27/17   Maren Reamer, MD  potassium chloride (K-DUR) 10 MEQ tablet Take 1 tablet (10 mEq total) by mouth 2 (two) times daily. 11/26/16   Odai Wimmer, Leda Quail, MD  Riociguat (ADEMPAS) 1 MG TABS Take 1 mg by mouth 3 (three) times daily. 12/11/16   Tresa Garter, MD  rivaroxaban (XARELTO) 20 MG TABS tablet Take 1 tablet (20 mg total) by mouth daily with supper. 01/27/17   Maren Reamer, MD  sodium chloride (OCEAN) 0.65 % SOLN nasal spray Place 1 spray into both nostrils as needed for congestion. 10/14/16   Maren Reamer, MD  traZODone (DESYREL) 150 MG tablet Take 1 tablet (150 mg total) by mouth at bedtime as needed for sleep. 01/02/17   Arfeen, Arlyce Harman, MD  triamcinolone cream (KENALOG) 0.1 % Apply 1 application topically 2 (two) times daily. For rash on left leg 05/15/16   Daleen Bo, MD  warfarin (COUMADIN) 5 MG tablet Take 1 tablet (5 mg total) by mouth daily at 6 PM. 12/25/16 12/26/16  Lottie Mussel T, MD     Objective:   Vitals:   01/27/17 1141  BP: (!) 154/84  Pulse: 83  Resp: 16  Temp: 98.4 F (36.9 C)  TempSrc: Oral  SpO2: 94%  Weight: (!) 323 lb (146.5 kg)   12/11/16 324lbs  Exam General appearance : Awake, alert, not in any distress. Speech Clear. Not toxic looking, stressed, tearful, morbid obese. HEENT: Atraumatic and Normocephalic, pupils equally reactive to light. Neck: supple, no JVD.  Chest:Good air entry bilaterally, no added sounds. CVS: S1 S2 regular, no murmurs/gallups or rubs. Abdomen: Bowel sounds active, Non tender, obese.  Extremities: B/L Lower Ext shows no edema, both legs are warm to touch Neurology: Awake alert, and oriented X 3, CN II-XII grossly intact, Non focal Skin:No Rash  Data Review Lab Results  Component Value Date   HGBA1C 5.6 06/27/2016    Depression screen  [facility] 2/9 01/27/2017 12/11/2016 10/01/2016 08/14/2016 07/05/2016  Decreased Interest 1 2 1 1 1   Down, Depressed, Hopeless 1 2 2 1 2   PHQ - 2 Score 2 4 3 2 3   Altered sleeping 3 2 3 2 3   Tired, decreased energy 3 3 1 1 1   Change in appetite 0 2 0 0 1  Feeling bad or failure about yourself  1 2 1  0 0  Trouble concentrating 1 2 1  0 1  Moving slowly or fidgety/restless 0 1 0 0 0  Suicidal thoughts 0 0 0 0 0  PHQ-9 Score 10 16 9 5 9   Difficult doing work/chores - - - - -  Some recent data might be hidden      Assessment & Plan   1. CTEPH (chronic thromboembolic pulmonary hypertension) ([place]) - per pt, now qualifies for Xarelto again, currently on warfarin. She would rather get back to Xarelto. - xarelto 20mg  qd ordered, stop coumadin when starts Xarelto same day (her INR last was 1.8) - fu w/ cards/pulm next appts. - hx of ivc filter and sp thromboembolectomy 7/17 - ordered for pulm rehab today since Pt states has not heard about it.  2. Essential hypertension Elevated, typically controlled, suspect stressors making it worse. - her norvasc was recently stopped 12/11/17 for low bp and c/o of dizziness - will follow closely, may need to go back on lower dose. - continue metoprolol  3. 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  4. Adjustment disorder with depressed mood On welbutrin, resulti new rx (but pt states only had 2 wks supply trial), and trazodone is helping some w/ sleep - per psyche Will ask Jasmine to f/u w/ her as well due to acute stressors. Pt did not want to stay to see, so will ask for telephone fu.  5. Morbid obesity ([place]) Seeing bariatric surgeon, but there are many obstacles currently for surgery, most of which is financial.  6. OSA on CPAP Has cpap now and using.  7. Hyperglycemia - POCT glucose (manual entry)  8. Gastroesophageal reflux disease without esophagitis C/o of unproductive cough, lungs clear, trial ppi  protonix 40 qd for gerd. gerd diet discussed, info provided.     Patient have been counseled extensively about nutrition and exercise  Return in about 2 months (around 03/29/2017) for pulm  PE/anxiety/depression/osa.  The patient was given clear instructions to go to ER or return to medical center if symptoms don't improve, worsen or new problems develop. The patient verbalized understanding. The patient was told to call to get lab results if they haven't heard anything in the next week.   This note has been created with Surveyor, quantity. Any transcriptional errors are unintentional.   Maren Reamer, MD, [place] [facility], [place]   01/27/2017, 2:00 PM

## 2017-02-04 ENCOUNTER — Other Ambulatory Visit: Payer: Self-pay | Admitting: Internal Medicine

## 2017-02-04 MED ORDER — FLUTICASONE PROPIONATE 50 MCG/ACT NA SUSP: 2.0000 | Freq: Every day | NASAL | 6 refills | Status: DC

## 2017-02-04 NOTE — Progress Notes (Signed)
PT would like rx sent to [facility] made aware that it has [facility]

## 2017-02-04 NOTE — Progress Notes (Unsigned)
[name] [name] - I resent [name]'s rx for flonase to [facility]. Not [facility]. [facility], than we need to add pt info that [facility].

## 2017-02-05 ENCOUNTER — Telehealth: Payer: Self-pay | Admitting: Licensed Clinical Social Worker

## 2017-02-05 NOTE — Telephone Encounter (Signed)
LCSWA contacted pt via telephone to follow up on behavioral health.   Pt stated that she has participated in psychotherapy and medication management through [facility] for two months; however, feels like it is "not working" Hitchita commended pt on participating in services and stressed the importance of implementing healthy interventions to decrease symptoms of anxiety and depression. Pt reported concerns regarding weight gain on multiple medications prescribed by psychiatrist and shared plans to discontinue services at next scheduled appointments (June 6-Psychiatrist and June 16-Counselor) She reports no SI/HI/AVH.  LCSWA and pt discussed psychosocial stressors. Resources were provided.

## 2017-02-12 ENCOUNTER — Other Ambulatory Visit (HOSPITAL_COMMUNITY): Payer: Self-pay | Admitting: Cardiology

## 2017-02-12 NOTE — Telephone Encounter (Signed)
Patients nurse called during [facility] visit with reports that despite having nausea and dizziness with ADEMPAS patient would still like to increase to next titration dose (2.5mg ).  Patient is also still interested in pulm rehab- will phone down to check on referral   B/p 134/82 weight 318

## 2017-02-13 ENCOUNTER — Ambulatory Visit (HOSPITAL_COMMUNITY): Payer: Self-pay | Admitting: Psychiatry

## 2017-02-19 ENCOUNTER — Ambulatory Visit (HOSPITAL_COMMUNITY): Payer: Self-pay | Admitting: Psychiatry

## 2017-02-20 ENCOUNTER — Telehealth: Payer: Self-pay | Admitting: Internal Medicine

## 2017-02-20 ENCOUNTER — Encounter (HOSPITAL_COMMUNITY): Payer: Self-pay

## 2017-02-20 ENCOUNTER — Ambulatory Visit (HOSPITAL_COMMUNITY)
Admission: RE | Admit: 2017-02-20 | Discharge: 2017-02-20 | Disposition: A | Discharge status: Home / Self Care | Payer: Medicare HMO | Source: Ambulatory Visit | Attending: Internal Medicine | Admitting: Internal Medicine

## 2017-02-20 DIAGNOSIS — I2782 Chronic pulmonary embolism: Secondary | ICD-10-CM | POA: Diagnosis not present

## 2017-02-20 MED ORDER — HEPARIN SOD (PORK) LOCK FLUSH 100 UNIT/ML IV SOLN
500.0000 [IU] | INTRAVENOUS | Status: DC
  Administered 2017-02-20: 500 [IU]
  Filled 2017-02-20: qty 5

## 2017-02-20 MED ORDER — SODIUM CHLORIDE 0.9% FLUSH
10.0000 mL | INTRAVENOUS | Status: DC
  Administered 2017-02-20: 10 mL via INTRAVENOUS

## 2017-02-20 NOTE — Discharge Instructions (Signed)
Implanted Port Insertion, Care After °This sheet gives you information about how to care for yourself after your procedure. Your health care provider may also give you more specific instructions. If you have problems or questions, contact your health care provider. °What can I expect after the procedure? °After your procedure, it is common to have: °· Discomfort at the port insertion site. °· Bruising on the skin over the port. This should improve over 3-4 days. ° °Follow these instructions at home: °Port care °· After your port is placed, you will get a manufacturer's information card. The card has information about your port. Keep this card with you at all times. °· Take care of the port as told by your health care provider. Ask your health care provider if you or a family member can get training for taking care of the port at home. A home health care nurse may also take care of the port. °· Make sure to remember what type of port you have. °Incision care °· Follow instructions from your health care provider about how to take care of your port insertion site. Make sure you: °? Wash your hands with soap and water before you change your bandage (dressing). If soap and water are not available, use hand sanitizer. °? Change your dressing as told by your health care provider. °? Leave stitches (sutures), skin glue, or adhesive strips in place. These skin closures may need to stay in place for 2 weeks or longer. If adhesive strip edges start to loosen and curl up, you may trim the loose edges. Do not remove adhesive strips completely unless your health care provider tells you to do that. °· Check your port insertion site every day for signs of infection. Check for: °? More redness, swelling, or pain. °? More fluid or blood. °? Warmth. °? Pus or a bad smell. °General instructions °· Do not take baths, swim, or use a hot tub until your health care provider approves. °· Do not lift anything that is heavier than 10 lb (4.5  kg) for a week, or as told by your health care provider. °· Ask your health care provider when it is okay to: °? Return to work or school. °? Resume usual physical activities or sports. °· Do not drive for 24 hours if you were given a medicine to help you relax (sedative). °· Take over-the-counter and prescription medicines only as told by your health care provider. °· Wear a medical alert bracelet in case of an emergency. This will tell any health care providers that you have a port. °· Keep all follow-up visits as told by your health care provider. This is important. °Contact a health care provider if: °· You cannot flush your port with saline as directed, or you cannot draw blood from the port. °· You have a fever or chills. °· You have more redness, swelling, or pain around your port insertion site. °· You have more fluid or blood coming from your port insertion site. °· Your port insertion site feels warm to the touch. °· You have pus or a bad smell coming from the port insertion site. °Get help right away if: °· You have chest pain or shortness of breath. °· You have bleeding from your port that you cannot control. °Summary °· Take care of the port as told by your health care provider. °· Change your dressing as told by your health care provider. °· Keep all follow-up visits as told by your health care provider. °  This information is not intended to replace advice given to you by your health care provider. Make sure you discuss any questions you have with your health care provider. Document Released: 06/16/2013 Document Revised: 07/17/2016 Document Reviewed: 07/17/2016  Heparin injection What is this medicine? HEPARIN (HEP a rin) is an anticoagulant. It is used to treat or prevent clots in the veins, arteries, lungs, or heart. It stops clots from forming or getting bigger. This medicine prevents clotting during open-heart surgery, dialysis, or in patients who are confined to bed. This medicine may be used  for other purposes; ask your health care provider or pharmacist if you have questions. COMMON BRAND NAME(S): Hep-Lock, Hep-Lock U/P, Hepflush-10, Monoject Prefill Advanced Heparin Lock Flush What should I tell my health care provider before I take this medicine? They need to know if you have any of these conditions: -bleeding disorders, such as hemophilia or low blood platelets -bowel disease or diverticulitis -endocarditis -high blood pressure -liver disease -recent surgery or delivery of a baby -stomach ulcers -an unusual or allergic reaction to heparin, benzyl alcohol, sulfites, other medicines, foods, dyes, or preservatives -pregnant or trying to get pregnant -breast-feeding How should I use this medicine? This medicine is given by injection or infusion into a vein. It can also be given by injection of small amounts under the skin. It is usually given by a health care professional in a hospital or clinic setting. If you get this medicine at home, you will be taught how to prepare and give this medicine. Use exactly as directed. Take your medicine at regular intervals. Do not take it more often than directed. Do not stop taking except on your doctor's advice. Stopping this medicine may increase your risk of a blot clot. Be sure to refill your prescription before you run out of medicine. It is important that you put your used needles and syringes in a special sharps container. Do not put them in a trash can. If you do not have a sharps container, call your pharmacist or healthcare provider to get one. Talk to your pediatrician regarding the use of this medicine in children. While this medicine may be prescribed for children for selected conditions, precautions do apply. Overdosage: If you think you have taken too much of this medicine contact a poison control center or emergency room at once. NOTE: This medicine is only for you. Do not share this medicine with others. What if I miss a  dose? If you miss a dose, take it as soon as you can. If it is almost time for your next dose, take only that dose. Do not take double or extra doses. What may interact with this medicine? Do not take this medicine with any of the following medications: -aspirin and aspirin-like drugs -mifepristone -medicines that treat or prevent blood clots like warfarin, enoxaparin, and dalteparin -palifermin -protamine This medicine may also interact with the following medications: -dextran -digoxin -hydroxychloroquine -medicines for treating colds or allergies -nicotine -NSAIDs, medicines for pain and inflammation, like ibuprofen or naproxen -phenylbutazone -tetracycline antibiotics This list may not describe all possible interactions. Give your health care provider a list of all the medicines, herbs, non-prescription drugs, or dietary supplements you use. Also tell them if you smoke, drink alcohol, or use illegal drugs. Some items may interact with your medicine. What should I watch for while using this medicine? While you are taking this medicine, carry an identification card with your name, the name and dose of medicine(s) being used, and the  name and phone number of your doctor or health care professional or person to contact in an emergency. Notify your doctor or health care professional and seek emergency treatment if you develop breathing problems; changes in vision; chest pain; severe, sudden headache; pain, swelling, warmth in the leg; trouble speaking; sudden numbness or weakness of the face, arm, or leg. These can be signs that your condition has gotten worse. Notify your doctor or health care professional at once if you have cold, blue hands or feet. If you are going to have surgery or dental work, tell your doctor or health care professional that you have received this medicine. Be careful brushing and flossing your teeth or using a toothpick while receiving this medicine because you may  bleed more easily. Avoid sports and activities that might cause injury while you are using this medicine. Severe falls or injuries can cause unseen bleeding. Be careful when using sharp tools or knives. Consider using an Copy. What side effects may I notice from receiving this medicine? Side effects that you should report to your doctor or health care professional as soon as possible: -allergic reactions like skin rash, itching or hives, swelling of the face, lips, or tongue -back pain -burning or itching on the bottoms of the feet -cold, blue, or painful hands and feet -feeling faint or lightheaded, falls -fever, chills -nausea, vomiting -signs and symptoms of bleeding such as bloody or black, tarry stools; red or dark-brown urine; spitting up blood or brown material that looks like coffee grounds; red spots on the skin; unusual bruising or bleeding from the eye, gums, or nose -stomach pain -unusually low blood pressure Side effects that usually do not require medical attention (report to your doctor or health care professional if they continue or are bothersome): -pain, redness, or irritation at site where injected This list may not describe all possible side effects. Call your doctor for medical advice about side effects. You may report side effects to FDA at 1-800-FDA-1088. Where should I keep my medicine? Keep out of the reach of children. Store unopened vials at room temperature between 15 and 30 degrees C (59 and 86 degrees F). Do not freeze. Do not use if solution is discolored or particulate matter is present. Throw away any unused medicine after the expiration date. NOTE: This sheet is a summary. It may not cover all possible information. If you have questions about this medicine, talk to your doctor, pharmacist, or health care provider.  2018 Elsevier/Gold Standard (2012-12-22 16:19:24)  Elsevier Interactive Patient Education  2017 [employer].

## 2017-02-20 NOTE — Telephone Encounter (Signed)
PT MRN [id number], called to speak with you, to please call her back after 1:30pm today, thanks

## 2017-02-20 NOTE — Progress Notes (Signed)
Presented for port flush and lab work (protime/ INR), however Pt states she stopped coumadin 2 weeks ago.  Attempt call to office however they were closed for lunch, callback and confirmed that Pt does not need lab work with each visit. Port accessed easily with second nurse stabilizing/ holding port. Blood return noted and flushed with heparin per protocol.

## 2017-02-21 ENCOUNTER — Telehealth: Payer: Self-pay | Admitting: Licensed Clinical Social Worker

## 2017-02-21 NOTE — Telephone Encounter (Signed)
LCSWA returned pt's call. Pt inquired about previous referrals to Rheumatologist and Pain Clinic. LCSWA met with Referral Coordinator, Maren Reamer, to provide pt with requested information.   Pt was informed of an active referral to Cardiac Rehabilitation and encouraged to contact PCP if appointment has not been scheduled. Pt verbalized understanding.

## 2017-02-24 ENCOUNTER — Telehealth (HOSPITAL_COMMUNITY): Payer: Self-pay | Admitting: *Deleted

## 2017-02-24 DIAGNOSIS — I270 Primary pulmonary hypertension: Secondary | ICD-10-CM

## 2017-02-24 NOTE — Telephone Encounter (Signed)
[place] from cardiac rehab called asking for [place] to order patient to have pulmonary rehab.  She was ordered to have cardiac rehab but does not qualify for it.  I have placed pulmonary rehab order.

## 2017-03-01 DIAGNOSIS — I272 Pulmonary hypertension, unspecified: Secondary | ICD-10-CM | POA: Diagnosis not present

## 2017-03-03 ENCOUNTER — Encounter (HOSPITAL_COMMUNITY)
Admission: RE | Admit: 2017-03-03 | Discharge: 2017-03-03 | Disposition: A | Discharge status: Home / Self Care | Payer: Medicare HMO | Source: Ambulatory Visit | Attending: Internal Medicine | Admitting: Internal Medicine

## 2017-03-03 ENCOUNTER — Encounter: Payer: Self-pay | Admitting: Physician Assistant

## 2017-03-03 ENCOUNTER — Encounter (HOSPITAL_COMMUNITY): Payer: Self-pay

## 2017-03-03 ENCOUNTER — Ambulatory Visit: Payer: Medicare HMO | Attending: Physician Assistant | Admitting: Physician Assistant

## 2017-03-03 VITALS — BP 104/63 | HR 87 | Temp 98.1°F | Resp 18 | Ht 64.0 in | Wt 323.0 lb

## 2017-03-03 VITALS — BP 111/59 | HR 92 | Ht 64.0 in | Wt 324.7 lb

## 2017-03-03 DIAGNOSIS — D649 Anemia, unspecified: Secondary | ICD-10-CM | POA: Insufficient documentation

## 2017-03-03 DIAGNOSIS — Z7951 Long term (current) use of inhaled steroids: Secondary | ICD-10-CM | POA: Diagnosis not present

## 2017-03-03 DIAGNOSIS — I272 Pulmonary hypertension, unspecified: Secondary | ICD-10-CM | POA: Insufficient documentation

## 2017-03-03 DIAGNOSIS — R05 Cough: Secondary | ICD-10-CM | POA: Diagnosis not present

## 2017-03-03 DIAGNOSIS — Z7901 Long term (current) use of anticoagulants: Secondary | ICD-10-CM | POA: Diagnosis not present

## 2017-03-03 DIAGNOSIS — M1990 Unspecified osteoarthritis, unspecified site: Secondary | ICD-10-CM | POA: Diagnosis not present

## 2017-03-03 DIAGNOSIS — Z79899 Other long term (current) drug therapy: Secondary | ICD-10-CM | POA: Insufficient documentation

## 2017-03-03 DIAGNOSIS — E669 Obesity, unspecified: Secondary | ICD-10-CM | POA: Diagnosis not present

## 2017-03-03 DIAGNOSIS — E785 Hyperlipidemia, unspecified: Secondary | ICD-10-CM | POA: Insufficient documentation

## 2017-03-03 DIAGNOSIS — G8929 Other chronic pain: Secondary | ICD-10-CM | POA: Insufficient documentation

## 2017-03-03 DIAGNOSIS — F329 Major depressive disorder, single episode, unspecified: Secondary | ICD-10-CM | POA: Insufficient documentation

## 2017-03-03 DIAGNOSIS — I10 Essential (primary) hypertension: Secondary | ICD-10-CM

## 2017-03-03 DIAGNOSIS — Z86711 Personal history of pulmonary embolism: Secondary | ICD-10-CM | POA: Insufficient documentation

## 2017-03-03 DIAGNOSIS — Z6841 Body Mass Index (BMI) 40.0 and over, adult: Secondary | ICD-10-CM | POA: Diagnosis not present

## 2017-03-03 DIAGNOSIS — R059 Cough, unspecified: Secondary | ICD-10-CM

## 2017-03-03 HISTORY — DX: Hyperlipidemia, unspecified: E78.5

## 2017-03-03 MED ORDER — ACETAMINOPHEN-CODEINE #3 300-30 MG PO TABS: 1.0000 | ORAL_TABLET | Freq: Three times a day (TID) | ORAL | 0 refills | Status: DC | PRN

## 2017-03-03 MED ORDER — OMEPRAZOLE 40 MG PO CPDR: 40.0000 mg | DELAYED_RELEASE_CAPSULE | Freq: Every day | ORAL | 3 refills | Status: DC

## 2017-03-03 MED ORDER — LORATADINE 10 MG PO TABS: 10.0000 mg | ORAL_TABLET | Freq: Every day | ORAL | 11 refills | Status: DC

## 2017-03-03 NOTE — Progress Notes (Signed)
[name] [name] 56 y.o. female Pulmonary Rehab Orientation Note Patient arrived today in Cardiac and Pulmonary Rehab for orientation to Pulmonary Rehab. She was transported from [employer] via wheel chair. She does not carry portable oxygen. Per pt, she uses oxygen never. Color good, skin warm and dry. Patient is oriented to time and place. Patient's medical history, psychosocial health, and medications reviewed. Psychosocial assessment reveals pt lives with their spouse and her grandson. Pt is currently unemployed, disabled from her obesity knee and back issues. Pt hobbies include reading and gardening in pots in her back yard. Pt reports her stress level is high. Areas of stress/anxiety include Health Finances.  Pt exhibits signs of depression. Signs of depression include hopelessness and sadness and difficulty falling asleep and difficulty maintaining sleep. PHQ2/9 score 5/11. Pt shows good  coping skills with positive outlook .  offered emotional support and reassurance.  She has seen a psychiatrist and taken antidepressants in the past, but did not feel it was helpful, so presently refuses treatment.  She tries to keep a positive attitude and is hopeful that pulmonary rehab will improve her quality of life. Will continue to monitor and evaluate progress toward psychosocial goal(s) of getting back to a higher level of strength and stamina by beginning an exercise program. Physical assessment reveals heart rate is normal, breath sounds clear to auscultation, no wheezes, rales, or rhonchi. Grip strength equal, strong. Distal pulses 2+ bilateral posterior tibial pulses present with 2+ bilateral pedal edema. Patient reports she does take medications as prescribed. Patient states she follows a Low Sodium diet and  would like to lose weight, but has been unsuccessful by herself.. Patient's weight will be monitored closely. Demonstration and practice of PLB using pulse oximeter. Patient able to return demonstration  satisfactorily. Safety and hand hygiene in the exercise area reviewed with patient. Patient voices understanding of the information reviewed. Department expectations discussed with patient and achievable goals were set. The patient shows enthusiasm about attending the program and we look forward to working with this nice lady. The patient is scheduled for a 6 min walk test on Tuesday, March 11, 2017 @ 3:30 pm and to begin exercise on Tuesday, March 18, 2017 in the 1:30 pm class.   [id number] time spent with orientation to pulmonary rehab

## 2017-03-03 NOTE — Progress Notes (Signed)
[name] [name], is a 56 y.o. female  YHC:[id number]  DVV:[id number]  DOB - 1961/04/17  Subjective:  Chief Complaint and HPI: [name] [name] is a 56 y.o. female here today for 2 year h/o cough that has worsened over the last 2 months.  Cough is worse at night.  She does admit to previous problems with allergies and reflux and isn't taking claritin or omeprazole.  Mucus is clear/white/yellow.  She had been having problems with her tongue that resolved after gargling with peroxide.  No fever.  No weight loss.  Also needs her monthly pain medication prescription.  Last printed on 01/27/2017.    ROS:   Constitutional:  No f/c, No night sweats, No unexplained weight loss. EENT:  No vision changes, No blurry vision, No hearing changes. No mouth, throat, or ear problems.  Respiratory: +cough, some SOB Cardiac: No CP, no palpitations GI:  No abd pain, No N/V/D. GU: No Urinary s/sx Musculoskeletal: No joint pain Neuro: No headache, no dizziness, no motor weakness.  Skin: No rash Endocrine:  No polydipsia. No polyuria.  Psych: Denies SI/HI  No problems updated.  ALLERGIES: No Known Allergies  PAST MEDICAL HISTORY: Past Medical History:  Diagnosis Date  . Arthritis   . Depression   . Diverticulitis   . Hyperlipidemia   . Hypertension   . Obesity   . Pneumonia   . Pulmonary embolism ([place])     MEDICATIONS AT HOME: Prior to Admission medications   Medication Sig Start Date End Date Taking? Authorizing Provider  acetaminophen-codeine (TYLENOL #3) 300-30 MG tablet Take 1 tablet by mouth every 8 (eight) hours as needed for moderate pain. Max total Tyelnol < 2000mg  /day 03/03/17  Yes McClung, Hlee M, PA-C  albuterol (PROVENTIL HFA;VENTOLIN HFA) 108 (90 Base) MCG/ACT inhaler Inhale 1-2 puffs into the lungs every 6 (six) hours as needed for wheezing or shortness of breath. 02/08/16  Yes Maryan Puls, MD  amLODipine (NORVASC) 5 MG tablet Take 5 mg by mouth once.   Yes [provider]  atorvastatin (LIPITOR) 20 MG tablet Take 1 tablet (20 mg total) by mouth daily at 6 PM. 01/27/17  Yes Langeland, Dawn T, MD  diclofenac (FLECTOR) 1.3 % PTCH Place 1 patch onto the skin 2 (two) times daily. 10/16/16  Yes Bensimhon, Shaune Pascal, MD  docusate sodium (COLACE) 100 MG capsule Take 1 capsule (100 mg total) by mouth every 12 (twelve) hours. 10/24/16  Yes Margarita Mail, PA-C  ferrous sulfate (FERROUSUL) 325 (65 FE) MG tablet Take 1 tablet (325 mg total) by mouth daily with breakfast. 12/25/16  Yes Langeland, Dawn T, MD  fluticasone (FLONASE) 50 MCG/ACT nasal spray Place 2 sprays into both nostrils daily. 02/04/17  Yes Langeland, Dawn T, MD  furosemide (LASIX) 40 MG tablet Take 1 tablet (40 mg total) by mouth daily. Take extra 40 mg tablet once in the afternoon AS NEEDED for weight gain 3 lbs or more. 01/27/17  Yes Langeland, Dawn T, MD  meclizine (ANTIVERT) 25 MG tablet TAKE ONE TABLET BY MOUTH THREE TIMES DAILY AS NEEDED FOR  DIZZINESS 12/23/16  Yes Langeland, Dawn T, MD  metoprolol tartrate (LOPRESSOR) 25 MG tablet Take 1 tablet (25 mg total) by mouth 2 (two) times daily. 01/27/17  Yes Langeland, Dawn T, MD  potassium chloride (K-DUR) 10 MEQ tablet Take 1 tablet (10 mEq total) by mouth 2 (two) times daily. 11/26/16  Yes Langeland, Dawn T, MD  Riociguat (ADEMPAS) 1 MG TABS Take 1 mg by mouth 3 (  three) times daily. 12/11/16  Yes Tresa Garter, MD  rivaroxaban (XARELTO) 20 MG TABS tablet Take 1 tablet (20 mg total) by mouth daily with supper. 01/27/17  Yes Langeland, Dawn T, MD  gabapentin (NEURONTIN) 300 MG capsule Take 1 capsule (300 mg total) by mouth 3 (three) times daily. 01/27/17 02/26/17  Maren Reamer, MD  loratadine (CLARITIN) 10 MG tablet Take 1 tablet (10 mg total) by mouth daily. 03/03/17   Argentina Donovan, PA-C  omeprazole (PRILOSEC) 40 MG capsule Take 1 capsule (40 mg total) by mouth daily. 03/03/17   Argentina Donovan, PA-C  warfarin (COUMADIN) 5 MG tablet Take 1 tablet (5 mg  total) by mouth daily at 6 PM. 12/25/16 12/26/16  Lottie Mussel T, MD     Objective:  EXAM:   Vitals:   03/03/17 1142  BP: 104/63  Pulse: 87  Resp: 18  Temp: 98.1 F (36.7 C)  TempSrc: Oral  SpO2: 100%  Weight: (!) 323 lb (146.5 kg)  Height: 5\' 4"  (1.626 m)    General appearance : A&OX3. NAD. Non-toxic-appearing HEENT: Atraumatic and Normocephalic.  PERRLA. EOM intact.  TM clear B. Mouth-MMM, post pharynx WNL w/o erythema, + PND.  +geographic tongue without abnormal lesions Neck: supple, no JVD. No cervical lymphadenopathy. No thyromegaly Chest/Lungs:  Breathing-non-labored, Good air entry bilaterally, breath sounds normal without rales, rhonchi, or wheezing  CVS: S1 S2 regular, no murmurs, gallops, rubs  Extremities: Bilateral Lower Ext shows no edema, both legs are warm to touch with = pulse throughout Neurology:  CN II-XII grossly intact, Non focal.   Psych:  TP linear. J/I WNL. Normal speech. Appropriate eye contact and affect.  Skin:  No Rash  Data Review Lab Results  Component Value Date   HGBA1C 5.6 06/27/2016     Assessment & Plan   1. Essential hypertension Controlled; continue current regimen  2. Cough ?reflux vs allergic - loratadine (CLARITIN) 10 MG tablet; Take 1 tablet (10 mg total) by mouth daily.  Dispense: 30 tablet; Refill: 11 - omeprazole (PRILOSEC) 40 MG capsule; Take 1 capsule (40 mg total) by mouth daily.  Dispense: 30 capsule; Refill: 3 - DG Chest 2 View; Future  3. Other chronic pain - acetaminophen-codeine (TYLENOL #3) 300-30 MG tablet; Take 1 tablet by mouth every 8 (eight) hours as needed for moderate pain. Max total Tyelnol < 2000mg  /day  Dispense: 50 tablet; Refill: 0  4. Anemia, unspecified type On Iron now - CBC with Differential/Platelet  Patient have been counseled extensively about nutrition and exercise  Return for keep appt with Dr [name] in August.; sooner if needed.  The patient was given clear instructions to go to ER  or return to medical center if symptoms don't improve, worsen or new problems develop. The patient verbalized understanding. The patient was told to call to get lab results if they haven't heard anything in the next week.    Freeman Caldron, PA-C [facility] and [place], [place]   03/03/2017, 12:03 PMPatient ID: [name] [name], female   DOB: [birth year], 56 y.o.   MRN: [id number]

## 2017-03-03 NOTE — Progress Notes (Signed)
Patient is here for FU  Patient complains of productive cough being present for the past two months post pneumonia.  Patient has taken medication today. Patient has eaten today.

## 2017-03-05 ENCOUNTER — Encounter (HOSPITAL_COMMUNITY): Payer: Self-pay | Admitting: *Deleted

## 2017-03-10 ENCOUNTER — Other Ambulatory Visit: Payer: Self-pay | Admitting: Pharmacist

## 2017-03-10 MED ORDER — POTASSIUM CHLORIDE ER 10 MEQ PO TBCR: 10.0000 meq | EXTENDED_RELEASE_TABLET | Freq: Two times a day (BID) | ORAL | 1 refills | Status: DC

## 2017-03-11 ENCOUNTER — Telehealth (HOSPITAL_COMMUNITY): Payer: Self-pay | Admitting: *Deleted

## 2017-03-11 ENCOUNTER — Inpatient Hospital Stay (HOSPITAL_COMMUNITY): Admission: RE | Admit: 2017-03-11 | Payer: Self-pay | Source: Ambulatory Visit

## 2017-03-18 ENCOUNTER — Encounter (HOSPITAL_COMMUNITY): Payer: Medicare HMO

## 2017-03-18 DIAGNOSIS — G4733 Obstructive sleep apnea (adult) (pediatric): Secondary | ICD-10-CM | POA: Diagnosis not present

## 2017-03-20 ENCOUNTER — Ambulatory Visit (HOSPITAL_COMMUNITY)
Admission: RE | Admit: 2017-03-20 | Discharge: 2017-03-20 | Disposition: A | Discharge status: Home / Self Care | Payer: Medicare HMO | Source: Ambulatory Visit | Attending: Internal Medicine | Admitting: Internal Medicine

## 2017-03-20 ENCOUNTER — Encounter (HOSPITAL_COMMUNITY): Payer: Self-pay

## 2017-03-20 ENCOUNTER — Ambulatory Visit (HOSPITAL_COMMUNITY)
Admission: RE | Admit: 2017-03-20 | Discharge: 2017-03-20 | Disposition: A | Discharge status: Home / Self Care | Payer: Medicare HMO | Source: Ambulatory Visit | Attending: Physician Assistant | Admitting: Physician Assistant

## 2017-03-20 ENCOUNTER — Encounter (HOSPITAL_COMMUNITY): Payer: Medicare HMO

## 2017-03-20 DIAGNOSIS — R509 Fever, unspecified: Secondary | ICD-10-CM | POA: Diagnosis not present

## 2017-03-20 DIAGNOSIS — R05 Cough: Secondary | ICD-10-CM | POA: Diagnosis not present

## 2017-03-20 DIAGNOSIS — Z452 Encounter for adjustment and management of vascular access device: Secondary | ICD-10-CM | POA: Insufficient documentation

## 2017-03-20 DIAGNOSIS — R059 Cough, unspecified: Secondary | ICD-10-CM

## 2017-03-20 LAB — CBC WITH DIFFERENTIAL/PLATELET
Basophils Absolute: 0 10*3/uL (ref 0.0–0.1)
Basophils Relative: 0 %
Eosinophils Absolute: 0.2 10*3/uL (ref 0.0–0.7)
Eosinophils Relative: 3 %
HCT: 34.3 % — ABNORMAL LOW (ref 36.0–46.0)
Hemoglobin: 11.1 g/dL — ABNORMAL LOW (ref 12.0–15.0)
Lymphocytes Relative: 31 %
Lymphs Abs: 1.9 10*3/uL (ref 0.7–4.0)
MCH: 20.7 pg — ABNORMAL LOW (ref 26.0–34.0)
MCHC: 32.4 g/dL (ref 30.0–36.0)
MCV: 64 fL — ABNORMAL LOW (ref 78.0–100.0)
Monocytes Absolute: 0.5 10*3/uL (ref 0.1–1.0)
Monocytes Relative: 8 %
Neutro Abs: 3.6 10*3/uL (ref 1.7–7.7)
Neutrophils Relative %: 58 %
Platelets: 333 10*3/uL (ref 150–400)
RBC: 5.36 MIL/uL — ABNORMAL HIGH (ref 3.87–5.11)
RDW: 18.1 % — ABNORMAL HIGH (ref 11.5–15.5)
WBC: 6.2 10*3/uL (ref 4.0–10.5)

## 2017-03-20 MED ORDER — HEPARIN SOD (PORK) LOCK FLUSH 100 UNIT/ML IV SOLN
500.0000 [IU] | INTRAVENOUS | Status: AC
  Administered 2017-03-20: 500 [IU]
  Filled 2017-03-20: qty 5

## 2017-03-20 MED ORDER — SODIUM CHLORIDE 0.9% FLUSH
10.0000 mL | INTRAVENOUS | Status: DC
  Administered 2017-03-20: 10 mL via INTRAVENOUS

## 2017-03-24 ENCOUNTER — Telehealth: Payer: Self-pay | Admitting: *Deleted

## 2017-03-24 NOTE — Telephone Encounter (Signed)
-----   Message from Argentina Donovan, [place] sent at 03/20/2017  1:05 PM EDT ----- Your xray did not show any abnormalities other than some old scarring.    Thanks, Freeman Caldron, PA-C

## 2017-03-24 NOTE — Telephone Encounter (Signed)
Patient verified DOB Patient is aware of lab results showing stable blood count and US showing no new abnormalities only old scarring. No further questions at this time.

## 2017-03-25 ENCOUNTER — Encounter (HOSPITAL_COMMUNITY)
Admission: RE | Admit: 2017-03-25 | Discharge: 2017-03-25 | Disposition: A | Discharge status: Home / Self Care | Payer: Medicare HMO | Source: Ambulatory Visit | Attending: Internal Medicine | Admitting: Internal Medicine

## 2017-03-25 ENCOUNTER — Encounter (HOSPITAL_COMMUNITY): Payer: Medicare HMO

## 2017-03-25 DIAGNOSIS — I272 Pulmonary hypertension, unspecified: Secondary | ICD-10-CM | POA: Insufficient documentation

## 2017-03-25 NOTE — Progress Notes (Signed)
Pulmonary Individual Treatment Plan  Patient Details  Name: [name] [name] MRN: [id number] Date of Birth: 04/02/1961 Referring Provider:     Pulmonary Rehab Walk Test from 03/25/2017 in [place]  Referring Provider  Dr. Haroldine Laws      Initial Encounter Date:    Pulmonary Rehab Walk Test from 03/25/2017 in [place]  Date  03/25/17  Referring Provider  Dr. Haroldine Laws      Visit Diagnosis: Pulmonary hypertension ([place])  Patient's Home Medications on Admission:   Current Outpatient Prescriptions:  .  acetaminophen-codeine (TYLENOL #3) 300-30 MG tablet, Take 1 tablet by mouth every 8 (eight) hours as needed for moderate pain. Max total Tyelnol < 2058m /day, Disp: 50 tablet, Rfl: 0 .  albuterol (PROVENTIL HFA;VENTOLIN HFA) 108 (90 Base) MCG/ACT inhaler, Inhale 1-2 puffs into the lungs every 6 (six) hours as needed for wheezing or shortness of breath., Disp: 6.7 g, Rfl: 5 .  amLODipine (NORVASC) 5 MG tablet, Take 5 mg by mouth once., Disp: , Rfl:  .  atorvastatin (LIPITOR) 20 MG tablet, Take 1 tablet (20 mg total) by mouth daily at 6 PM., Disp: 90 tablet, Rfl: 2 .  diclofenac (FLECTOR) 1.3 % PTCH, Place 1 patch onto the skin 2 (two) times daily., Disp: 60 patch, Rfl: 6 .  docusate sodium (COLACE) 100 MG capsule, Take 1 capsule (100 mg total) by mouth every 12 (twelve) hours., Disp: 30 capsule, Rfl: 0 .  ferrous sulfate (FERROUSUL) 325 (65 FE) MG tablet, Take 1 tablet (325 mg total) by mouth daily with breakfast., Disp: 90 tablet, Rfl: 3 .  fluticasone (FLONASE) 50 MCG/ACT nasal spray, Place 2 sprays into both nostrils daily., Disp: 16 g, Rfl: 6 .  furosemide (LASIX) 40 MG tablet, Take 1 tablet (40 mg total) by mouth daily. Take extra 40 mg tablet once in the afternoon AS NEEDED for weight gain 3 lbs or more., Disp: 60 tablet, Rfl: 3 .  gabapentin (NEURONTIN) 300 MG capsule, Take 1 capsule (300 mg total) by mouth 3 (three) times  daily., Disp: 90 capsule, Rfl: 1 .  loratadine (CLARITIN) 10 MG tablet, Take 1 tablet (10 mg total) by mouth daily., Disp: 30 tablet, Rfl: 11 .  meclizine (ANTIVERT) 25 MG tablet, TAKE ONE TABLET BY MOUTH THREE TIMES DAILY AS NEEDED FOR  DIZZINESS, Disp: 60 tablet, Rfl: 0 .  metoprolol tartrate (LOPRESSOR) 25 MG tablet, Take 1 tablet (25 mg total) by mouth 2 (two) times daily., Disp: 180 tablet, Rfl: 3 .  omeprazole (PRILOSEC) 40 MG capsule, Take 1 capsule (40 mg total) by mouth daily., Disp: 30 capsule, Rfl: 3 .  potassium chloride (K-DUR) 10 MEQ tablet, Take 1 tablet (10 mEq total) by mouth 2 (two) times daily., Disp: 60 tablet, Rfl: 1 .  Riociguat (ADEMPAS) 1 MG TABS, Take 1 mg by mouth 3 (three) times daily., Disp: 90 tablet, Rfl: 11 .  rivaroxaban (XARELTO) 20 MG TABS tablet, Take 1 tablet (20 mg total) by mouth daily with supper., Disp: 90 tablet, Rfl: 3 .  warfarin (COUMADIN) 5 MG tablet, Take 1 tablet (5 mg total) by mouth daily at 6 PM., Disp: 30 tablet, Rfl: 3  Past Medical History: Past Medical History:  Diagnosis Date  . Arthritis   . Depression   . Diverticulitis   . Hyperlipidemia   . Hypertension   . Obesity   . Pneumonia   . Pulmonary embolism ([facility])     Tobacco Use: History  Smoking Status  . Never Smoker  Smokeless Tobacco  . Never Used    Labs: Recent Review Flowsheet Data    Labs for ITP Cardiac and Pulmonary Rehab Latest Ref Rng & Units 06/27/2016 10/28/2016 10/28/2016 10/28/2016   Cholestrol 0 - 200 mg/dL 166 - - -   LDLCALC 0 - 99 mg/dL 108(H) - - -   HDL >40 mg/dL 33(L) - - -   Trlycerides <150 mg/dL 125 - - -   Hemoglobin A1c 4.8 - 5.6 % 5.6 - - -   HCO3 20.0 - 28.0 mmol/L - 32.6(H) 27.9 28.4(H)   TCO2 0 - 100 mmol/L - 34 29 30   O2SAT % - 70.0 68.0 71.0      Capillary Blood Glucose: No results found for: GLUCAP   ADL UCSD:     Pulmonary Assessment Scores    Row Name 03/05/17 1417         ADL UCSD   ADL Phase Entry     SOB Score total 67        CAT Score   CAT Score 28  Entry        Pulmonary Function Assessment:     Pulmonary Function Assessment - 03/03/17 1054      Breath   Bilateral Breath Sounds Clear   Shortness of Breath Yes;Limiting activity      Exercise Target Goals: Date: 03/25/17  Exercise Program Goal: Individual exercise prescription set with THRR, safety & activity barriers. Participant demonstrates ability to understand and report RPE using BORG scale, to self-measure pulse accurately, and to acknowledge the importance of the exercise prescription.  Exercise Prescription Goal: Starting with aerobic activity 30 plus minutes a day, 3 days per week for initial exercise prescription. Provide home exercise prescription and guidelines that participant acknowledges understanding prior to discharge.  Activity Barriers & Risk Stratification:   6 Minute Walk:     6 Minute Walk    Row Name 03/25/17 1653         6 Minute Walk   Phase Initial     Distance 1024 feet     Walk Time 6 minutes     # of Rest Breaks 0     MPH 1.9     METS 2.45     RPE 13     Perceived Dyspnea  1     Symptoms Yes (comment)     Comments left hip pain 7/10     Resting HR 103 bpm     Resting BP 104/70     Max Ex. HR 131 bpm     Max Ex. BP 120/80       Interval HR   Baseline HR 103     1 Minute HR 110     2 Minute HR 123     3 Minute HR 127     4 Minute HR 130     5 Minute HR 131     6 Minute HR 130     2 Minute Post HR 112     Interval Heart Rate? Yes       Interval Oxygen   Interval Oxygen? Yes     Baseline Oxygen Saturation % 93 %     Baseline Liters of Oxygen 0 L     1 Minute Oxygen Saturation % 94 %     1 Minute Liters of Oxygen 0 L     2 Minute Oxygen Saturation % 89 %     2  Minute Liters of Oxygen 0 L     3 Minute Oxygen Saturation % 93 %     3 Minute Liters of Oxygen 0 L     4 Minute Oxygen Saturation % 89 %     4 Minute Liters of Oxygen 0 L     5 Minute Oxygen Saturation % 93 %     5 Minute  Liters of Oxygen 0 L     6 Minute Oxygen Saturation % 93 %     6 Minute Liters of Oxygen 0 L     2 Minute Post Oxygen Saturation % 97 %     2 Minute Post Liters of Oxygen 0 L        Oxygen Initial Assessment:     Oxygen Initial Assessment - 03/03/17 1059      Home Oxygen   Home Oxygen Device None   Sleep Oxygen Prescription None   Home Exercise Oxygen Prescription None   Home at Rest Exercise Oxygen Prescription None      Oxygen Re-Evaluation:   Oxygen Discharge (Final Oxygen Re-Evaluation):   Initial Exercise Prescription:     Initial Exercise Prescription - 03/25/17 1600      Date of Initial Exercise RX and Referring Provider   Date 03/25/17   Referring Provider Dr. Haroldine Laws     NuStep   Level 2   Minutes 17   METs 1.5     Track   Laps 15   Minutes 30     Prescription Details   Frequency (times per week) 2   Duration Progress to 45 minutes of aerobic exercise without signs/symptoms of physical distress     Intensity   THRR 40-80% of Max Heartrate 66-133   Ratings of Perceived Exertion 11-13   Perceived Dyspnea 0-4     Progression   Progression Continue progressive overload as per policy without signs/symptoms or physical distress.     Resistance Training   Training Prescription Yes   Weight orange bands  Pulm HTN   Reps 10-15      Perform Capillary Blood Glucose checks as needed.  Exercise Prescription Changes:   Exercise Comments:   Exercise Goals and Review:   Exercise Goals Re-Evaluation :   Discharge Exercise Prescription (Final Exercise Prescription Changes):   Nutrition:  Target Goals: Understanding of nutrition guidelines, daily intake of sodium 1500mg , cholesterol 200mg , calories 30% from fat and 7% or less from saturated fats, daily to have 5 or more servings of fruits and vegetables.  Biometrics:     Pre Biometrics - 03/03/17 1059      Pre Biometrics   Grip Strength 24 kg       Nutrition Therapy Plan and  Nutrition Goals:   Nutrition Discharge: Rate Your Plate Scores:   Nutrition Goals Re-Evaluation:   Nutrition Goals Discharge (Final Nutrition Goals Re-Evaluation):   Psychosocial: Target Goals: Acknowledge presence or absence of significant depression and/or stress, maximize coping skills, provide positive support system. Participant is able to verbalize types and ability to use techniques and skills needed for reducing stress and depression.  Initial Review & Psychosocial Screening:     Initial Psych Review & Screening - 03/03/17 1127      Initial Review   Current issues with History of Depression  chooses to not take antidepressants and go to counselling     [place]? Yes     Barriers   Psychosocial barriers to participate in program There are  no identifiable barriers or psychosocial needs.     Screening Interventions   Interventions Encouraged to exercise      Quality of Life Scores:   PHQ-9: Recent Review Flowsheet Data    Depression screen [facility] 2/9 03/03/2017 03/03/2017 01/27/2017 12/11/2016 10/01/2016   Decreased Interest 3 3 1 2 1    Down, Depressed, Hopeless 3 2 1 2 2    PHQ - 2 Score 6 5 2 4 3    Altered sleeping 3 3 3 2 3    Tired, decreased energy 2 1 3 3 1    Change in appetite 1 0 0 2 0   Feeling bad or failure about yourself  2 1 1 2 1    Trouble concentrating - 1 1 2 1    Moving slowly or fidgety/restless 0 0 0 1 0   Suicidal thoughts 0 0 0 0 0   PHQ-9 Score 14 11 10 16 9    Difficult doing work/chores - Not difficult at all - - -     Interpretation of Total Score  Total Score Depression Severity:  1-4 = Minimal depression, 5-9 = Mild depression, 10-14 = Moderate depression, 15-19 = Moderately severe depression, 20-27 = Severe depression   Psychosocial Evaluation and Intervention:     Psychosocial Evaluation - 03/03/17 1128      Psychosocial Evaluation & Interventions   Interventions Encouraged to exercise with the program  and follow exercise prescription   Comments patient is hoping exercising in pulmonary rehab will improve her quality of life   Continue Psychosocial Services  Follow up required by staff      Psychosocial Re-Evaluation:   Psychosocial Discharge (Final Psychosocial Re-Evaluation):   Education: Education Goals: Education classes will be provided on a weekly basis, covering required topics. Participant will state understanding/return demonstration of topics presented.  Learning Barriers/Preferences:     Learning Barriers/Preferences - 03/03/17 1053      Learning Barriers/Preferences   Learning Barriers None   Learning Preferences Audio;Computer/Internet;Pictoral;Written Material      Education Topics: Risk Factor Reduction:  -Group instruction that is supported by a PowerPoint presentation. Instructor discusses the definition of a risk factor, different risk factors for pulmonary disease, and how the heart and lungs work together.     Nutrition for Pulmonary Patient:  -Group instruction provided by PowerPoint slides, verbal discussion, and written materials to support subject matter. The instructor gives an explanation and review of healthy diet recommendations, which includes a discussion on weight management, recommendations for fruit and vegetable consumption, as well as protein, fluid, caffeine, fiber, sodium, sugar, and alcohol. Tips for eating when patients are short of breath are discussed.   Pursed Lip Breathing:  -Group instruction that is supported by demonstration and informational handouts. Instructor discusses the benefits of pursed lip and diaphragmatic breathing and detailed demonstration on how to preform both.     Oxygen Safety:  -Group instruction provided by PowerPoint, verbal discussion, and written material to support subject matter. There is an overview of "What is Oxygen" and "Why do we need it".  Instructor also reviews how to create a safe environment for  oxygen use, the importance of using oxygen as prescribed, and the risks of noncompliance. There is a brief discussion on traveling with oxygen and resources the patient may utilize.   Oxygen Equipment:  -Group instruction provided by [facility] Staff utilizing handouts, written materials, and equipment demonstrations.   Signs and Symptoms:  -Group instruction provided by written material and verbal discussion to support subject matter. Warning  signs and symptoms of infection, stroke, and heart attack are reviewed and when to call the physician/911 reinforced. Tips for preventing the spread of infection discussed.   Advanced Directives:  -Group instruction provided by verbal instruction and written material to support subject matter. Instructor reviews Advanced Directive laws and proper instruction for filling out document.   Pulmonary Video:  -Group video education that reviews the importance of medication and oxygen compliance, exercise, good nutrition, pulmonary hygiene, and pursed lip and diaphragmatic breathing for the pulmonary patient.   Exercise for the Pulmonary Patient:  -Group instruction that is supported by a PowerPoint presentation. Instructor discusses benefits of exercise, core components of exercise, frequency, duration, and intensity of an exercise routine, importance of utilizing pulse oximetry during exercise, safety while exercising, and options of places to exercise outside of rehab.     Pulmonary Medications:  -Verbally interactive group education provided by instructor with focus on inhaled medications and proper administration.   Anatomy and Physiology of the Respiratory System and Intimacy:  -Group instruction provided by PowerPoint, verbal discussion, and written material to support subject matter. Instructor reviews respiratory cycle and anatomical components of the respiratory system and their functions. Instructor also reviews differences in obstructive and  restrictive respiratory diseases with examples of each. Intimacy, Sex, and Sexuality differences are reviewed with a discussion on how relationships can change when diagnosed with pulmonary disease. Common sexual concerns are reviewed.   MD DAY -A group question and answer session with a medical doctor that allows participants to ask questions that relate to their pulmonary disease state.   OTHER EDUCATION -Group or individual verbal, written, or video instructions that support the educational goals of the pulmonary rehab program.   Knowledge Questionnaire Score:     Knowledge Questionnaire Score - 03/05/17 1417      Knowledge Questionnaire Score   Pre Score 7/13      Core Components/Risk Factors/Patient Goals at Admission:     Personal Goals and Risk Factors at Admission - 03/03/17 1059      Core Components/Risk Factors/Patient Goals on Admission    Weight Management Weight Loss;Yes   Intervention Weight Management: Develop a combined nutrition and exercise program designed to reach desired caloric intake, while maintaining appropriate intake of nutrient and fiber, sodium and fats, and appropriate energy expenditure required for the weight goal.;Weight Management: Provide education and appropriate resources to help participant work on and attain dietary goals.;Weight Management/Obesity: Establish reasonable short term and long term weight goals.;Obesity: Provide education and appropriate resources to help participant work on and attain dietary goals.   Admit Weight 324 lb 11.8 oz (147.3 kg)   Goal Weight: Short Term 320 lb (145.2 kg)   Expected Outcomes Short Term: Continue to assess and modify interventions until short term weight is achieved   Improve shortness of breath with ADL's Yes   Intervention Provide education, individualized exercise plan and daily activity instruction to help decrease symptoms of SOB with activities of daily living.   Expected Outcomes Short Term:  Achieves a reduction of symptoms when performing activities of daily living.   Develop more efficient breathing techniques such as purse lipped breathing and diaphragmatic breathing; and practicing self-pacing with activity Yes   Intervention Provide education, demonstration and support about specific breathing techniuqes utilized for more efficient breathing. Include techniques such as pursed lipped breathing, diaphragmatic breathing and self-pacing activity.   Expected Outcomes Short Term: Participant will be able to demonstrate and use breathing techniques as needed throughout daily activities.  Core Components/Risk Factors/Patient Goals Review:    Core Components/Risk Factors/Patient Goals at Discharge (Final Review):    ITP Comments:   Comments:

## 2017-03-27 ENCOUNTER — Encounter (HOSPITAL_COMMUNITY)
Admission: RE | Admit: 2017-03-27 | Discharge: 2017-03-27 | Disposition: A | Discharge status: Home / Self Care | Payer: Medicare HMO | Source: Ambulatory Visit | Attending: Internal Medicine | Admitting: Internal Medicine

## 2017-03-27 DIAGNOSIS — I272 Pulmonary hypertension, unspecified: Secondary | ICD-10-CM

## 2017-03-27 NOTE — Progress Notes (Signed)
Daily Session Note  Patient Details  Name: [name] [name] MRN: [id number] Date of Birth: [birth year] Referring Provider:     Pulmonary Rehab Walk Test from 03/25/2017 in [place]  Referring Provider  Dr. Haroldine Laws      Encounter Date: 03/27/2017  Check In:     Session Check In - 03/27/17 1349      Check-In   Location [facility]   Staff Present Rosebud Poles, RN, BSN;Molly diVincenzo, MS, ACSM RCEP, Exercise Physiologist;Lisa Ysidro Evert, RN;Giovani Neumeister Rollene Rotunda, RN, BSN   Supervising physician immediately available to respond to emergencies [facility] immediately available   Physician(s) Dr. Burnis Medin   Medication changes reported     No   Fall or balance concerns reported    No   Tobacco Cessation No Change   Warm-up and Cool-down Performed as group-led instruction   Resistance Training Performed Yes   VAD Patient? No     Pain Assessment   Currently in Pain? No/denies   Multiple Pain Sites No      Capillary Blood Glucose: No results found for this or any previous visit (from the past 24 hour(s)).      Exercise Prescription Changes - 03/27/17 1552      Response to Exercise   Blood Pressure (Admit) 100/54   Blood Pressure (Exercise) 100/60   Blood Pressure (Exit) 100/60   Heart Rate (Admit) 76 bpm   Heart Rate (Exercise) 96 bpm   Heart Rate (Exit) 92 bpm   Oxygen Saturation (Admit) 95 %   Oxygen Saturation (Exercise) 96 %   Oxygen Saturation (Exit) 97 %   Rating of Perceived Exertion (Exercise) 12   Perceived Dyspnea (Exercise) 1   Duration Progress to 45 minutes of aerobic exercise without signs/symptoms of physical distress   Intensity Other (comment)  40-80% HRR     Resistance Training   Training Prescription Yes   Weight orange bands  Pulm HTN   Reps 10-15     Track   Laps 12   Minutes 30      History  Smoking Status  . Never Smoker  Smokeless Tobacco  . Never Used    Goals Met:  Exercise tolerated  well No report of cardiac concerns or symptoms Strength training completed today  Goals Unmet:  Not Applicable  Comments: Service time is from 1330 to 1530   Dr. Rush Farmer is Medical Director for Pulmonary Rehab at [facility].

## 2017-03-27 NOTE — Progress Notes (Signed)
[name] [name] 56 y.o. female  DOB: Jul 19, 1961 MRN: [id number]           Nutrition Screen Dx: Pulmonary HTN Past Medical History:  Diagnosis Date  . Arthritis   . Depression   . Diverticulitis   . Hyperlipidemia   . Hypertension   . Obesity   . Pneumonia   . Pulmonary embolism ([place])    Meds reviewed. Ht: Ht Readings from Last 1 Encounters:  03/03/17 5\' 4"  (1.626 m)     Wt:   Wt Readings from Last 3 Encounters:  03/20/17 (!) 320 lb 6.4 oz (145.3 kg)  03/03/17 (!) 324 lb 11.8 oz (147.3 kg)  03/03/17 (!) 323 lb (146.5 kg)     BMI: 55.9    Current tobacco use? No  Labs:  Lipid Panel     Component Value Date/Time   CHOL 166 06/27/2016 0346   TRIG 125 06/27/2016 0346   HDL 33 (L) 06/27/2016 0346   CHOLHDL 5.0 06/27/2016 0346   VLDL 25 06/27/2016 0346   LDLCALC 108 (H) 06/27/2016 0346    Lab Results  Component Value Date   HGBA1C 5.6 06/27/2016    Nutrition Diagnosis ? Food-and nutrition-related knowledge deficit related to lack of exposure to information as related to diagnosis of pulmonary disease ? Obesity related to excessive energy intake as evidenced by a BMI of 55.9   Goal(s) 1. Identify food quantities necessary to achieve wt loss of  -2# per week to a goal wt loss of 2.7-10.9 kg (6-24 lb) at graduation from pulmonary rehab. 2.  Plan:  Pt to attend Pulmonary Nutrition class Will provide client-centered nutrition education as part of interdisciplinary care.   Monitor and evaluate progress toward nutrition goal with team.  Monitor and Evaluate progress toward nutrition goal with team.   Derek Mound, M.Ed, RD, LDN, CDE 03/27/2017 7:52 AM

## 2017-03-31 ENCOUNTER — Other Ambulatory Visit: Payer: Self-pay

## 2017-03-31 NOTE — Patient Outreach (Signed)
[facility]) Care Management  03/31/17  Ramonda Galyon 1961/06/15 [id number]  Successful outreach completed with patient. Patient identification verified. Patient stated she has been "doing ok." She stated she started pulmonary rehab last week and goes on Tuesdays and Thursdays. Stated that so far, it has been going well. She currently has no other concerns or needs. She is agreeable to a home visit next week to reassess her health status and needs.  Plan: Home visit scheduled for next week.  [name] Lorely Bubb, RN, BSN, [place] Management Coordinator [phone]

## 2017-04-01 ENCOUNTER — Encounter (HOSPITAL_COMMUNITY)
Admission: RE | Admit: 2017-04-01 | Discharge: 2017-04-01 | Disposition: A | Discharge status: Home / Self Care | Payer: Medicare HMO | Source: Ambulatory Visit | Attending: Internal Medicine | Admitting: Internal Medicine

## 2017-04-01 VITALS — Wt 323.4 lb

## 2017-04-01 DIAGNOSIS — I272 Pulmonary hypertension, unspecified: Secondary | ICD-10-CM

## 2017-04-01 NOTE — Progress Notes (Signed)
Daily Session Note  Patient Details  Name: [name] [name] MRN: [id number] Date of Birth: 06-Dec-1960 Referring Provider:     Pulmonary Rehab Walk Test from 03/25/2017 in [place]  Referring Provider  Dr. Haroldine Laws      Encounter Date: 04/01/2017  Check In:     Session Check In - 04/01/17 1536      Check-In   Location [facility]   Staff Present Rodney Langton, RN;Joan Leonia Reeves, RN, Deland Pretty, MS, ACSM CEP, Exercise Physiologist;Annedrea Rosezella [place], RN, [facility]   Supervising physician immediately available to respond to emergencies [facility] immediately available   Physician(s) Dr. Burnis Medin   Medication changes reported     No   Fall or balance concerns reported    No   Tobacco Cessation No Change   Warm-up and Cool-down Performed as group-led instruction   Resistance Training Performed Yes   VAD Patient? No     Pain Assessment   Currently in Pain? No/denies   Multiple Pain Sites No      Capillary Blood Glucose: No results found for this or any previous visit (from the past 24 hour(s)).      Exercise Prescription Changes - 04/01/17 1500      Response to Exercise   Blood Pressure (Admit) 94/54  Pt given gator-aide before exercise   Blood Pressure (Exercise) 94/50   Blood Pressure (Exit) 94/50   Heart Rate (Admit) 74 bpm   Heart Rate (Exercise) 109 bpm   Heart Rate (Exit) 68 bpm   Oxygen Saturation (Admit) 92 %   Oxygen Saturation (Exercise) 92 %   Oxygen Saturation (Exit) 98 %   Rating of Perceived Exertion (Exercise) 13   Perceived Dyspnea (Exercise) 2   Duration Progress to 45 minutes of aerobic exercise without signs/symptoms of physical distress   Intensity THRR unchanged     Progression   Progression Continue to progress workloads to maintain intensity without signs/symptoms of physical distress.     Resistance Training   Training Prescription Yes   Weight orange bands   Reps 10-15   Time 10  Minutes     NuStep   Level 2   Minutes 17   METs 2     Track   Laps 11   Minutes 34      History  Smoking Status  . Never Smoker  Smokeless Tobacco  . Never Used    Goals Met:  No report of cardiac concerns or symptoms Strength training completed today  Goals Unmet:  Not Applicable  Comments: Service time is from 1330 to 1505Dr. Rush Farmer is Medical Director for Pulmonary Rehab at [facility].

## 2017-04-02 ENCOUNTER — Other Ambulatory Visit: Payer: Self-pay | Admitting: Pharmacist

## 2017-04-02 MED ORDER — MECLIZINE HCL 25 MG PO TABS: ORAL_TABLET | ORAL | 0 refills | Status: DC

## 2017-04-03 ENCOUNTER — Telehealth (HOSPITAL_COMMUNITY): Payer: Self-pay

## 2017-04-03 ENCOUNTER — Other Ambulatory Visit (HOSPITAL_COMMUNITY): Payer: Self-pay

## 2017-04-03 ENCOUNTER — Encounter (HOSPITAL_COMMUNITY): Payer: Medicare HMO

## 2017-04-03 ENCOUNTER — Telehealth: Payer: Self-pay | Admitting: Licensed Clinical Social Worker

## 2017-04-03 MED ORDER — MECLIZINE HCL 25 MG PO TABS: ORAL_TABLET | ORAL | 0 refills | Status: DC

## 2017-04-03 NOTE — Telephone Encounter (Signed)
CSW received referral for supportive intervention as patient called clinic for medication refill and expressed frustration. CSW attempted to contact patient with no answer. CSW will see patient on next clinic visit. [name] [name], [place], [place]

## 2017-04-03 NOTE — Telephone Encounter (Signed)
Patient called to report continued dizziness and low bp past few weeks.  Noticed the lower BP with pulmonary rehab.  Patients bp at pulm rehab 90/50s and patient states she has been holding her meds in am while she goes to rehab and still low. Advised to get refill of meclizine to see if that helps her dizziness. Patient seems tearful and defeated with her symptoms and states "i just want to feel better to live life". Will forward to Raquel Sarna with SW to make aware to see if she can come by during patient's appointment next Wednesday 8/1 at 11 am to offer any support/resources for patient.  Renee Pain, RN

## 2017-04-08 ENCOUNTER — Encounter (HOSPITAL_COMMUNITY): Payer: Self-pay

## 2017-04-08 ENCOUNTER — Encounter (HOSPITAL_COMMUNITY)
Admission: RE | Admit: 2017-04-08 | Discharge: 2017-04-08 | Disposition: A | Discharge status: Home / Self Care | Payer: Medicare HMO | Source: Ambulatory Visit | Attending: Internal Medicine | Admitting: Internal Medicine

## 2017-04-08 DIAGNOSIS — I272 Pulmonary hypertension, unspecified: Secondary | ICD-10-CM

## 2017-04-08 NOTE — Progress Notes (Signed)
Pulmonary Individual Treatment Plan  Patient Details  Name: [name] [name] MRN: [id number] Date of Birth: 04/02/1961 Referring Provider:     Pulmonary Rehab Walk Test from 03/25/2017 in [place]  Referring Provider  Dr. Haroldine Laws      Initial Encounter Date:    Pulmonary Rehab Walk Test from 03/25/2017 in [facility]  Date  03/25/17  Referring Provider  Dr. Haroldine Laws      Visit Diagnosis: Pulmonary hypertension ([place])  Patient's Home Medications on Admission:   Current Outpatient Prescriptions:  .  acetaminophen-codeine (TYLENOL #3) 300-30 MG tablet, Take 1 tablet by mouth every 8 (eight) hours as needed for moderate pain. Max total Tyelnol < 2058m /day, Disp: 50 tablet, Rfl: 0 .  albuterol (PROVENTIL HFA;VENTOLIN HFA) 108 (90 Base) MCG/ACT inhaler, Inhale 1-2 puffs into the lungs every 6 (six) hours as needed for wheezing or shortness of breath., Disp: 6.7 g, Rfl: 5 .  amLODipine (NORVASC) 5 MG tablet, Take 5 mg by mouth once., Disp: , Rfl:  .  atorvastatin (LIPITOR) 20 MG tablet, Take 1 tablet (20 mg total) by mouth daily at 6 PM., Disp: 90 tablet, Rfl: 2 .  diclofenac (FLECTOR) 1.3 % PTCH, Place 1 patch onto the skin 2 (two) times daily., Disp: 60 patch, Rfl: 6 .  docusate sodium (COLACE) 100 MG capsule, Take 1 capsule (100 mg total) by mouth every 12 (twelve) hours., Disp: 30 capsule, Rfl: 0 .  ferrous sulfate (FERROUSUL) 325 (65 FE) MG tablet, Take 1 tablet (325 mg total) by mouth daily with breakfast., Disp: 90 tablet, Rfl: 3 .  fluticasone (FLONASE) 50 MCG/ACT nasal spray, Place 2 sprays into both nostrils daily., Disp: 16 g, Rfl: 6 .  furosemide (LASIX) 40 MG tablet, Take 1 tablet (40 mg total) by mouth daily. Take extra 40 mg tablet once in the afternoon AS NEEDED for weight gain 3 lbs or more., Disp: 60 tablet, Rfl: 3 .  gabapentin (NEURONTIN) 300 MG capsule, Take 1 capsule (300 mg total) by mouth 3 (three) times  daily., Disp: 90 capsule, Rfl: 1 .  loratadine (CLARITIN) 10 MG tablet, Take 1 tablet (10 mg total) by mouth daily., Disp: 30 tablet, Rfl: 11 .  meclizine (ANTIVERT) 25 MG tablet, TAKE ONE TABLET BY MOUTH THREE TIMES DAILY AS NEEDED FOR  DIZZINESS, Disp: 60 tablet, Rfl: 0 .  metoprolol tartrate (LOPRESSOR) 25 MG tablet, Take 1 tablet (25 mg total) by mouth 2 (two) times daily., Disp: 180 tablet, Rfl: 3 .  omeprazole (PRILOSEC) 40 MG capsule, Take 1 capsule (40 mg total) by mouth daily., Disp: 30 capsule, Rfl: 3 .  potassium chloride (K-DUR) 10 MEQ tablet, Take 1 tablet (10 mEq total) by mouth 2 (two) times daily., Disp: 60 tablet, Rfl: 1 .  Riociguat (ADEMPAS) 1 MG TABS, Take 1 mg by mouth 3 (three) times daily., Disp: 90 tablet, Rfl: 11 .  rivaroxaban (XARELTO) 20 MG TABS tablet, Take 1 tablet (20 mg total) by mouth daily with supper., Disp: 90 tablet, Rfl: 3 .  warfarin (COUMADIN) 5 MG tablet, Take 1 tablet (5 mg total) by mouth daily at 6 PM., Disp: 30 tablet, Rfl: 3  Past Medical History: Past Medical History:  Diagnosis Date  . Arthritis   . Depression   . Diverticulitis   . Hyperlipidemia   . Hypertension   . Obesity   . Pneumonia   . Pulmonary embolism (H[facility])     Tobacco Use: History  Smoking Status  . Never Smoker  Smokeless Tobacco  . Never Used    Labs: Recent Review Flowsheet Data    Labs for ITP Cardiac and Pulmonary Rehab Latest Ref Rng & Units 06/27/2016 10/28/2016 10/28/2016 10/28/2016   Cholestrol 0 - 200 mg/dL 166 - - -   LDLCALC 0 - 99 mg/dL 108(H) - - -   HDL >40 mg/dL 33(L) - - -   Trlycerides <150 mg/dL 125 - - -   Hemoglobin A1c 4.8 - 5.6 % 5.6 - - -   HCO3 20.0 - 28.0 mmol/L - 32.6(H) 27.9 28.4(H)   TCO2 0 - 100 mmol/L - 34 29 30   O2SAT % - 70.0 68.0 71.0      Capillary Blood Glucose: No results found for: GLUCAP   ADL UCSD:     Pulmonary Assessment Scores    Row Name 03/05/17 1417 03/25/17 1658       ADL UCSD   ADL Phase Entry Entry     SOB Score total 67  -      CAT Score   CAT Score 28  Entry  -      mMRC Score   mMRC Score  - 2       Pulmonary Function Assessment:     Pulmonary Function Assessment - 03/03/17 1054      Breath   Bilateral Breath Sounds Clear   Shortness of Breath Yes;Limiting activity      Exercise Target Goals:    Exercise Program Goal: Individual exercise prescription set with THRR, safety & activity barriers. Participant demonstrates ability to understand and report RPE using BORG scale, to self-measure pulse accurately, and to acknowledge the importance of the exercise prescription.  Exercise Prescription Goal: Starting with aerobic activity 30 plus minutes a day, 3 days per week for initial exercise prescription. Provide home exercise prescription and guidelines that participant acknowledges understanding prior to discharge.  Activity Barriers & Risk Stratification:   6 Minute Walk:     6 Minute Walk    Row Name 03/25/17 1653         6 Minute Walk   Phase Initial     Distance 1024 feet     Walk Time 6 minutes     # of Rest Breaks 0     MPH 1.9     METS 2.45     RPE 13     Perceived Dyspnea  1     Symptoms Yes (comment)     Comments left hip pain 7/10     Resting HR 103 bpm     Resting BP 104/70     Max Ex. HR 131 bpm     Max Ex. BP 120/80       Interval HR   Baseline HR 103     1 Minute HR 110     2 Minute HR 123     3 Minute HR 127     4 Minute HR 130     5 Minute HR 131     6 Minute HR 130     2 Minute Post HR 112     Interval Heart Rate? Yes       Interval Oxygen   Interval Oxygen? Yes     Baseline Oxygen Saturation % 93 %     Baseline Liters of Oxygen 0 L     1 Minute Oxygen Saturation % 94 %     1 Minute Liters of Oxygen 0 L  2 Minute Oxygen Saturation % 89 %     2 Minute Liters of Oxygen 0 L     3 Minute Oxygen Saturation % 93 %     3 Minute Liters of Oxygen 0 L     4 Minute Oxygen Saturation % 89 %     4 Minute Liters of Oxygen 0 L      5 Minute Oxygen Saturation % 93 %     5 Minute Liters of Oxygen 0 L     6 Minute Oxygen Saturation % 93 %     6 Minute Liters of Oxygen 0 L     2 Minute Post Oxygen Saturation % 97 %     2 Minute Post Liters of Oxygen 0 L        Oxygen Initial Assessment:     Oxygen Initial Assessment - 03/25/17 1657      Initial 6 min Walk   Oxygen Used None   Resting Oxygen Saturation  during 6 min walk 93 %   Exercise Oxygen Saturation  during 6 min walk 89 %     Program Oxygen Prescription   Program Oxygen Prescription None      Oxygen Re-Evaluation:     Oxygen Re-Evaluation    Row Name 04/08/17 1947             Program Oxygen Prescription   Program Oxygen Prescription None         Home Oxygen   Home Oxygen Device None       Sleep Oxygen Prescription None       Home Exercise Oxygen Prescription None       Home at Rest Exercise Oxygen Prescription None          Oxygen Discharge (Final Oxygen Re-Evaluation):     Oxygen Re-Evaluation - 04/08/17 1947      Program Oxygen Prescription   Program Oxygen Prescription None     Home Oxygen   Home Oxygen Device None   Sleep Oxygen Prescription None   Home Exercise Oxygen Prescription None   Home at Rest Exercise Oxygen Prescription None      Initial Exercise Prescription:     Initial Exercise Prescription - 03/25/17 1600      Date of Initial Exercise RX and Referring Provider   Date 03/25/17   Referring Provider Dr. Haroldine Laws     NuStep   Level 2   Minutes 17   METs 1.5     Track   Laps 15   Minutes 30     Prescription Details   Frequency (times per week) 2   Duration Progress to 45 minutes of aerobic exercise without signs/symptoms of physical distress     Intensity   THRR 40-80% of Max Heartrate 66-133   Ratings of Perceived Exertion 11-13   Perceived Dyspnea 0-4     Progression   Progression Continue progressive overload as per policy without signs/symptoms or physical distress.     Resistance  Training   Training Prescription Yes   Weight orange bands  Pulm HTN   Reps 10-15      Perform Capillary Blood Glucose checks as needed.  Exercise Prescription Changes:     Exercise Prescription Changes    Row Name 03/27/17 1552 04/01/17 1500           Response to Exercise   Blood Pressure (Admit) 100/54 94/54  Pt given gator-aide before exercise      Blood Pressure (Exercise) 100/60 94/50  Blood Pressure (Exit) 100/60 94/50      Heart Rate (Admit) 76 bpm 74 bpm      Heart Rate (Exercise) 96 bpm 109 bpm      Heart Rate (Exit) 92 bpm 68 bpm      Oxygen Saturation (Admit) 95 % 92 %      Oxygen Saturation (Exercise) 96 % 92 %      Oxygen Saturation (Exit) 97 % 98 %      Rating of Perceived Exertion (Exercise) 12 13      Perceived Dyspnea (Exercise) 1 2      Duration Progress to 45 minutes of aerobic exercise without signs/symptoms of physical distress Progress to 45 minutes of aerobic exercise without signs/symptoms of physical distress      Intensity Other (comment)  40-80% HRR THRR unchanged        Progression   Progression  - Continue to progress workloads to maintain intensity without signs/symptoms of physical distress.        Resistance Training   Training Prescription Yes Yes      Weight orange bands  Pulm HTN orange bands      Reps 10-15 10-15      Time  - 10 Minutes        NuStep   Level  - 2      Minutes  - 17      METs  - 2        Track   Laps 12 11      Minutes 30 34         Exercise Comments:   Exercise Goals and Review:   Exercise Goals Re-Evaluation :     Exercise Goals Re-Evaluation    Row Name 04/04/17 1345             Exercise Goal Re-Evaluation   Exercise Goals Review Increase Strenth and Stamina;Increase Physical Activity       Comments Patient has only attended two exercise sessions. Will cont. to monitor and progress as able.        Expected Outcomes Through exercise at home and at rehab, patient will increase physical  activity, strength, and stamina.           Discharge Exercise Prescription (Final Exercise Prescription Changes):     Exercise Prescription Changes - 04/01/17 1500      Response to Exercise   Blood Pressure (Admit) 94/54  Pt given gator-aide before exercise   Blood Pressure (Exercise) 94/50   Blood Pressure (Exit) 94/50   Heart Rate (Admit) 74 bpm   Heart Rate (Exercise) 109 bpm   Heart Rate (Exit) 68 bpm   Oxygen Saturation (Admit) 92 %   Oxygen Saturation (Exercise) 92 %   Oxygen Saturation (Exit) 98 %   Rating of Perceived Exertion (Exercise) 13   Perceived Dyspnea (Exercise) 2   Duration Progress to 45 minutes of aerobic exercise without signs/symptoms of physical distress   Intensity THRR unchanged     Progression   Progression Continue to progress workloads to maintain intensity without signs/symptoms of physical distress.     Resistance Training   Training Prescription Yes   Weight orange bands   Reps 10-15   Time 10 Minutes     NuStep   Level 2   Minutes 17   METs 2     Track   Laps 11   Minutes 34      Nutrition:  Target Goals: Understanding of nutrition guidelines, daily intake  of sodium <1564m, cholesterol <2079m calories 30% from fat and 7% or less from saturated fats, daily to have 5 or more servings of fruits and vegetables.  Biometrics:     Pre Biometrics - 03/03/17 1059      Pre Biometrics   Grip Strength 24 kg       Nutrition Therapy Plan and Nutrition Goals:     Nutrition Therapy & Goals - 03/27/17 0755      Nutrition Therapy   Diet Therapeutic Lifestyle Changes     Personal Nutrition Goals   Nutrition Goal Wt loss of 1-2 lb/week to a wt loss goal of 6-24 lb at graduation from Pulmonary Rehab.      Intervention Plan   Intervention Prescribe, educate and counsel regarding individualized specific dietary modifications aiming towards targeted core components such as weight, hypertension, lipid management, diabetes, heart failure  and other comorbidities.   Expected Outcomes Short Term Goal: Understand basic principles of dietary content, such as calories, fat, sodium, cholesterol and nutrients.;Long Term Goal: Adherence to prescribed nutrition plan.      Nutrition Discharge: Rate Your Plate Scores:     Nutrition Assessments - 03/27/17 0750      Rate Your Plate Scores   Pre Score 49      Nutrition Goals Re-Evaluation:   Nutrition Goals Discharge (Final Nutrition Goals Re-Evaluation):   Psychosocial: Target Goals: Acknowledge presence or absence of significant depression and/or stress, maximize coping skills, provide positive support system. Participant is able to verbalize types and ability to use techniques and skills needed for reducing stress and depression.  Initial Review & Psychosocial Screening:     Initial Psych Review & Screening - 03/03/17 1127      Initial Review   Current issues with History of Depression  chooses to not take antidepressants and go to counselling     [email or web address]     Barriers   Psychosocial barriers to participate in program There are no identifiable barriers or psychosocial needs.     Screening Interventions   Interventions Encouraged to exercise      Quality of Life Scores:   PHQ-9: Recent Review Flowsheet Data    [facility] 03/03/2017 03/03/2017 01/27/2017 12/11/2016 10/01/2016   Decreased Interest _0 Down, Depressed, Hopeless _1 PHQ - 2 Score _2 Altered sleeping _3 Tired, decreased energy _4 Change in appetite 1 0 0 2 0   Feeling bad or failure about yourself  _5 Trouble concentrating - _6 Moving slowly or fidgety/restless 0 0 0 1 0   Suicidal thoughts 0 0 0 0 0   PHQ-9 Score _7 Difficult doing work/chores - Not difficult at all - - -     Interpretation of Total Score  Total Score Depression Severity:  1-4 = Minimal depression, 5-9 =  Mild depression, 10-14 = Moderate depression, 15-19 = Moderately severe depression, 20-27 = Severe depression   Psychosocial Evaluation and Intervention:     Psychosocial Evaluation - 04/08/17 1949      Psychosocial Evaluation & Interventions   Interventions Encouraged to exercise with the program and follow exercise prescription   Continue Psychosocial Services  Follow up required by staff  Psychosocial Re-Evaluation:   Psychosocial Discharge (Final Psychosocial Re-Evaluation):   Education: Education Goals: Education classes will be provided on a weekly basis, covering required topics. Participant will state understanding/return demonstration of topics presented.  Learning Barriers/Preferences:     Learning Barriers/Preferences - 03/03/17 1053      Learning Barriers/Preferences   Learning Barriers None   Learning Preferences Audio;Computer/Internet;Pictoral;Written Material      Education Topics: Risk Factor Reduction:  -Group instruction that is supported by a PowerPoint presentation. Instructor discusses the definition of a risk factor, different risk factors for pulmonary disease, and how the heart and lungs work together.     Nutrition for Pulmonary Patient:  -Group instruction provided by PowerPoint slides, verbal discussion, and written materials to support subject matter. The instructor gives an explanation and review of healthy diet recommendations, which includes a discussion on weight management, recommendations for fruit and vegetable consumption, as well as protein, fluid, caffeine, fiber, sodium, sugar, and alcohol. Tips for eating when patients are short of breath are discussed.   Pursed Lip Breathing:  -Group instruction that is supported by demonstration and informational handouts. Instructor discusses the benefits of pursed lip and diaphragmatic breathing and detailed demonstration on how to preform both.     Oxygen Safety:  -Group instruction  provided by PowerPoint, verbal discussion, and written material to support subject matter. There is an overview of "What is Oxygen" and "Why do we need it".  Instructor also reviews how to create a safe environment for oxygen use, the importance of using oxygen as prescribed, and the risks of noncompliance. There is a brief discussion on traveling with oxygen and resources the patient may utilize.   Oxygen Equipment:  -Group instruction provided by [facility] Staff utilizing handouts, written materials, and equipment demonstrations.   Signs and Symptoms:  -Group instruction provided by written material and verbal discussion to support subject matter. Warning signs and symptoms of infection, stroke, and heart attack are reviewed and when to call the physician/911 reinforced. Tips for preventing the spread of infection discussed.   Advanced Directives:  -Group instruction provided by verbal instruction and written material to support subject matter. Instructor reviews Advanced Directive laws and proper instruction for filling out document.   Pulmonary Video:  -Group video education that reviews the importance of medication and oxygen compliance, exercise, good nutrition, pulmonary hygiene, and pursed lip and diaphragmatic breathing for the pulmonary patient.   Exercise for the Pulmonary Patient:  -Group instruction that is supported by a PowerPoint presentation. Instructor discusses benefits of exercise, core components of exercise, frequency, duration, and intensity of an exercise routine, importance of utilizing pulse oximetry during exercise, safety while exercising, and options of places to exercise outside of rehab.     Pulmonary Medications:  -Verbally interactive group education provided by instructor with focus on inhaled medications and proper administration.   Anatomy and Physiology of the Respiratory System and Intimacy:  -Group instruction provided by PowerPoint, verbal  discussion, and written material to support subject matter. Instructor reviews respiratory cycle and anatomical components of the respiratory system and their functions. Instructor also reviews differences in obstructive and restrictive respiratory diseases with examples of each. Intimacy, Sex, and Sexuality differences are reviewed with a discussion on how relationships can change when diagnosed with pulmonary disease. Common sexual concerns are reviewed.   PULMONARY REHAB OTHER RESPIRATORY from 03/27/2017 in [place]  Date  03/27/17  Educator  RN  Instruction Review Code  2- meets goals/outcomes  MD DAY -A group question and answer session with a medical doctor that allows participants to ask questions that relate to their pulmonary disease state.   OTHER EDUCATION -Group or individual verbal, written, or video instructions that support the educational goals of the pulmonary rehab program.   Knowledge Questionnaire Score:     Knowledge Questionnaire Score - 03/05/17 1417      Knowledge Questionnaire Score   Pre Score 7/13      Core Components/Risk Factors/Patient Goals at Admission:     Personal Goals and Risk Factors at Admission - 03/03/17 1059      Core Components/Risk Factors/Patient Goals on Admission    Weight Management Weight Loss;Yes   Intervention Weight Management: Develop a combined nutrition and exercise program designed to reach desired caloric intake, while maintaining appropriate intake of nutrient and fiber, sodium and fats, and appropriate energy expenditure required for the weight goal.;Weight Management: Provide education and appropriate resources to help participant work on and attain dietary goals.;Weight Management/Obesity: Establish reasonable short term and long term weight goals.;Obesity: Provide education and appropriate resources to help participant work on and attain dietary goals.   Admit Weight 324 lb 11.8 oz  (147.3 kg)   Goal Weight: Short Term 320 lb (145.2 kg)   Expected Outcomes Short Term: Continue to assess and modify interventions until short term weight is achieved   Improve shortness of breath with ADL's Yes   Intervention Provide education, individualized exercise plan and daily activity instruction to help decrease symptoms of SOB with activities of daily living.   Expected Outcomes Short Term: Achieves a reduction of symptoms when performing activities of daily living.   Develop more efficient breathing techniques such as purse lipped breathing and diaphragmatic breathing; and practicing self-pacing with activity Yes   Intervention Provide education, demonstration and support about specific breathing techniuqes utilized for more efficient breathing. Include techniques such as pursed lipped breathing, diaphragmatic breathing and self-pacing activity.   Expected Outcomes Short Term: Participant will be able to demonstrate and use breathing techniques as needed throughout daily activities.      Core Components/Risk Factors/Patient Goals Review:      Goals and Risk Factor Review    Row Name 04/08/17 1948             Core Components/Risk Factors/Patient Goals Review   Personal Goals Review Weight Management/Obesity;Develop more efficient breathing techniques such as purse lipped breathing and diaphragmatic breathing and practicing self-pacing with activity.;Improve shortness of breath with ADL's       Review patient has only attended 2 sessions since admission. too soon to evaluate progress towards goals       Expected Outcomes see admission expected outcomes          Core Components/Risk Factors/Patient Goals at Discharge (Final Review):      Goals and Risk Factor Review - 04/08/17 1948      Core Components/Risk Factors/Patient Goals Review   Personal Goals Review Weight Management/Obesity;Develop more efficient breathing techniques such as purse lipped breathing and  diaphragmatic breathing and practicing self-pacing with activity.;Improve shortness of breath with ADL's   Review patient has only attended 2 sessions since admission. too soon to evaluate progress towards goals   Expected Outcomes see admission expected outcomes      ITP Comments:   Comments: ITP REVIEW Unable to evaluate progress toward pulmonary rehab goals after completing only 2 sessions. Expect to see progress over the next 30 days.Recommend continued exercise, life style modification, education, and utilization of  breathing techniques to increase stamina and strength and decrease shortness of breath with exertion.

## 2017-04-09 ENCOUNTER — Encounter (HOSPITAL_COMMUNITY): Payer: Self-pay

## 2017-04-09 ENCOUNTER — Ambulatory Visit (HOSPITAL_COMMUNITY)
Admission: RE | Admit: 2017-04-09 | Discharge: 2017-04-09 | Disposition: A | Discharge status: Home / Self Care | Payer: Medicare HMO | Source: Ambulatory Visit | Attending: Cardiology | Admitting: Cardiology

## 2017-04-09 ENCOUNTER — Other Ambulatory Visit: Payer: Self-pay

## 2017-04-09 VITALS — BP 124/70 | HR 94 | Wt 322.4 lb

## 2017-04-09 DIAGNOSIS — M069 Rheumatoid arthritis, unspecified: Secondary | ICD-10-CM

## 2017-04-09 DIAGNOSIS — R079 Chest pain, unspecified: Secondary | ICD-10-CM | POA: Diagnosis not present

## 2017-04-09 DIAGNOSIS — I2724 Chronic thromboembolic pulmonary hypertension: Secondary | ICD-10-CM | POA: Diagnosis not present

## 2017-04-09 DIAGNOSIS — I5032 Chronic diastolic (congestive) heart failure: Secondary | ICD-10-CM | POA: Diagnosis not present

## 2017-04-09 DIAGNOSIS — G4733 Obstructive sleep apnea (adult) (pediatric): Secondary | ICD-10-CM | POA: Insufficient documentation

## 2017-04-09 DIAGNOSIS — G894 Chronic pain syndrome: Secondary | ICD-10-CM | POA: Insufficient documentation

## 2017-04-09 DIAGNOSIS — R42 Dizziness and giddiness: Secondary | ICD-10-CM | POA: Insufficient documentation

## 2017-04-09 DIAGNOSIS — M797 Fibromyalgia: Secondary | ICD-10-CM | POA: Diagnosis not present

## 2017-04-09 DIAGNOSIS — I270 Primary pulmonary hypertension: Secondary | ICD-10-CM | POA: Diagnosis not present

## 2017-04-09 DIAGNOSIS — I10 Essential (primary) hypertension: Secondary | ICD-10-CM | POA: Diagnosis not present

## 2017-04-09 DIAGNOSIS — F320 Major depressive disorder, single episode, mild: Secondary | ICD-10-CM | POA: Diagnosis not present

## 2017-04-09 DIAGNOSIS — F419 Anxiety disorder, unspecified: Secondary | ICD-10-CM | POA: Diagnosis not present

## 2017-04-09 DIAGNOSIS — E785 Hyperlipidemia, unspecified: Secondary | ICD-10-CM | POA: Diagnosis not present

## 2017-04-09 DIAGNOSIS — Z79899 Other long term (current) drug therapy: Secondary | ICD-10-CM | POA: Diagnosis not present

## 2017-04-09 DIAGNOSIS — Z6841 Body Mass Index (BMI) 40.0 and over, adult: Secondary | ICD-10-CM | POA: Diagnosis not present

## 2017-04-09 DIAGNOSIS — F329 Major depressive disorder, single episode, unspecified: Secondary | ICD-10-CM | POA: Insufficient documentation

## 2017-04-09 DIAGNOSIS — Z86711 Personal history of pulmonary embolism: Secondary | ICD-10-CM | POA: Insufficient documentation

## 2017-04-09 DIAGNOSIS — Z7901 Long term (current) use of anticoagulants: Secondary | ICD-10-CM | POA: Insufficient documentation

## 2017-04-09 LAB — BASIC METABOLIC PANEL
Anion gap: 6 (ref 5–15)
BUN: 14 mg/dL (ref 6–20)
CO2: 29 mmol/L (ref 22–32)
Calcium: 9.3 mg/dL (ref 8.9–10.3)
Chloride: 105 mmol/L (ref 101–111)
Creatinine, Ser: 1.03 mg/dL — ABNORMAL HIGH (ref 0.44–1.00)
GFR calc Af Amer: >60 mL/min (ref >60)
GFR calc non Af Amer: >60 mL/min (ref >60)
Glucose, Bld: 96 mg/dL (ref 65–99)
Potassium: 4.2 mmol/L (ref 3.5–5.1)
Sodium: 140 mmol/L (ref 135–145)

## 2017-04-09 MED ORDER — RIOCIGUAT 2.5 MG PO TABS: 2.0000 mg | ORAL_TABLET | Freq: Three times a day (TID) | ORAL | 3 refills | Status: DC

## 2017-04-09 NOTE — Progress Notes (Signed)
CSW referred for supportive intervention. Patient reports she is married and has care of her 55 yo grandson. Patient feeling overwhelmed with health and financial issues. Patient states she previously went to [facility] but "did not find it helpful". Patient became very tearful as she spoke of her health and "I'm trying but it's not working because I had to stop cardiac rehab due to low blood pressure and just everything". CSW provided supportive intervention and encouraged patient to return to [facility] and try the group that was suggested. Patient states she is not interested and would like to pursue someone else. CSW spoke of the paramedicine program that could visit at home and help with some of the health issues and patient very receptive. CSW will refer to paramedicine and continue to support patient through HF clinic. Patient appears satisfied at the moment with this plan. CSW will continue to explore other options. Raquel Sarna, [place], [place]

## 2017-04-09 NOTE — Progress Notes (Signed)
ADVANCED HF CLINIC NOTE  Date:  04/09/2017   ID:  [name] [name], DOB [birth year], MRN [id number]  PCP:  [name] Reamer, MD  HF MD: Dr [name] [name]    HPI: 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   Patient was in her [place] until 08/2015 when she started getting symptoms of dyspnea on exertion, dizziness, bilateral LE swelling, and constant R>L non-specific chest pain. These symptoms progressively worsened over several days prompting her to go to [facility] in her then-hometown of [place] in 09/2015 where she was diagnosed with PE and started on anticoagulation. She was very briefly on warfarin but quickly switched to [name] [name] was referred to [facility] in 12/2015. Patient refused LHC because of financial fears and unclear insurance status. Echocardiogram (12-2015) showed normal LVEF 55-60%, normal LA, normal RV size, RVSP estimated as 70 mm Hg. She further was referred to [facility] and saw Dr. Karena [name] on 03/14/2016 where patient had several studies including V/Q scan which [name] high prob for PE; studies c/w CTEPH. It was felt that her chronic chest pain was caused by chronic PE. RHC/LHC/Pulm Angiogram 03/2016 confirmed diagnosis of CTEPH. She underwent pulmonary thromboembolectomy 03/31/2016 at [facility] with IVC filter placement. Pre-op cath with no CAD.   Seen by Dr. Karena [name] in 04/2016 for follow up. He felt like her heart size was decreasing. Plan was for lifelong [place] and ECHO and V/Q in 4 months. Dr. Karena [name] has since left the [place] practice.  CT angio at [facility] on 05/15/16 showed " persistent but partially recanalized PE within right lower lobe pulmonary artery. No new PE identified. "  She was admitted to [facility] in 06/2016 for continued chest pain and parasthesias. V/Q scan on 06/27/16 showed  prominent ventilation perfusion mismatch in right c/w chronic PE. Head CT negative. She was continued on Xarelto and told to follow up with [facility]. Patient doesn't want to have to travel to [facility] for care so [place] care here in [place].   Saw [name] [name] recently for ongoing CP. Referred here for further management of CP and PAH. Echo 10/07/16. EF 55%. RV normal RVSP 88mHG  She presents today for HF follow up. Overall feeling poorly. Says that she feels dizzy most days, this is not new for her, she has felt dizzy frequently in the past but symptoms have worsened recently. She denies syncope and presyncope. Taking all medications, following a healthy diet. Expresses many emotional concerns about her health. Says she feels like she just isn't getting any better. Denies orthopnea, PND. Does feel SOB with walking into clinic and when she walks throughout her home.   10/2016 RHC RA = 8 RV = 64/13 PA = 65/19 (35) PCW = 9 Fick cardiac output/index = 7.5/3.1 PVR = 3.5 WU Ao sat = 96% PA sat = 68%,70% SVC sat = 71% Assessment: 1. Mild residual PAH in the setting of CTEPH s/p pulmonary thrombolectomy 2. Normal cardiac output 3. Normal left-sided filling pressures  Past Medical History:  Diagnosis Date  . Arthritis   . Depression   . Diverticulitis   . Hyperlipidemia   . Hypertension   . Obesity   . Pneumonia   . Pulmonary embolism ([facility]     Past Surgical History:  Procedure Laterality Date  . ABDOMINAL HYSTERECTOMY    .  BREAST REDUCTION SURGERY    . CHOLECYSTECTOMY    . EMBOLECTOMY N/A    pulmonary embolectomy  . RIGHT HEART CATH N/A 10/28/2016   Procedure: Right Heart Cath;  Surgeon: [name] Artist, MD;  Location: [facility];  Service: Cardiovascular;  Laterality: N/A;    Current Medications:  Current Outpatient Prescriptions on File Prior to Encounter  Medication Sig Dispense Refill  . acetaminophen-codeine (TYLENOL #3) 300-30 MG tablet Take 1 tablet by  mouth every 8 (eight) hours as needed for moderate pain. Max total Tyelnol < '2000mg'$  /day 50 tablet 0  . albuterol (PROVENTIL HFA;VENTOLIN HFA) 108 (90 Base) MCG/ACT inhaler Inhale 1-2 puffs into the lungs every 6 (six) hours as needed for wheezing or shortness of breath. 6.7 g 5  . amLODipine (NORVASC) 5 MG tablet Take 5 mg by mouth once.    [name] [name] Kitchen atorvastatin (LIPITOR) 20 MG tablet Take 1 tablet (20 mg total) by mouth daily at 6 PM. 90 tablet 2  . diclofenac (FLECTOR) 1.3 % PTCH Place 1 patch onto the skin 2 (two) times daily. 60 patch 6  . docusate sodium (COLACE) 100 MG capsule Take 1 capsule (100 mg total) by mouth every 12 (twelve) hours. 30 capsule 0  . ferrous sulfate (FERROUSUL) 325 (65 FE) MG tablet Take 1 tablet (325 mg total) by mouth daily with breakfast. 90 tablet 3  . fluticasone (FLONASE) 50 MCG/ACT nasal spray Place 2 sprays into both nostrils daily. 16 g 6  . furosemide (LASIX) 40 MG tablet Take 1 tablet (40 mg total) by mouth daily. Take extra 40 mg tablet once in the afternoon AS NEEDED for weight gain 3 lbs or more. 60 tablet 3  . gabapentin (NEURONTIN) 300 MG capsule Take 1 capsule (300 mg total) by mouth 3 (three) times daily. 90 capsule 1  . loratadine (CLARITIN) 10 MG tablet Take 1 tablet (10 mg total) by mouth daily. 30 tablet 11  . meclizine (ANTIVERT) 25 MG tablet TAKE ONE TABLET BY MOUTH THREE TIMES DAILY AS NEEDED FOR  DIZZINESS 60 tablet 0  . metoprolol tartrate (LOPRESSOR) 25 MG tablet Take 1 tablet (25 mg total) by mouth 2 (two) times daily. 180 tablet 3  . omeprazole (PRILOSEC) 40 MG capsule Take 1 capsule (40 mg total) by mouth daily. 30 capsule 3  . potassium chloride (K-DUR) 10 MEQ tablet Take 1 tablet (10 mEq total) by mouth 2 (two) times daily. 60 tablet 1  . Riociguat (ADEMPAS) 1 MG TABS Take 1 mg by mouth 3 (three) times daily. 90 tablet 11  . rivaroxaban (XARELTO) 20 MG TABS tablet Take 1 tablet (20 mg total) by mouth daily with supper. 90 tablet 3  . warfarin  (COUMADIN) 5 MG tablet Take 1 tablet (5 mg total) by mouth daily at 6 PM. 30 tablet 3   No current facility-administered medications on file prior to encounter.     Allergies:   [name].   Social History   Social History  . Marital status: Married    Spouse name: N/A  . Number of children: N/A  . Years of education: N/A   Social History Main Topics  . Smoking status: Never Smoker  . Smokeless tobacco: Never Used  . Alcohol use No  . Drug use: No  . Sexual activity: Yes    Partners: Male    Birth control/ protection: Surgical   Other Topics Concern  . None   Social History Narrative  . None  Family History:  The patient's family history includes Anxiety disorder in her cousin, sister, and sister; Bipolar disorder in her sister and sister; Dementia in her father and mother; Drug abuse in her sister and sister; Lung cancer in her maternal grandmother; Sexual abuse in her maternal aunt.      ROS:   Please see the history of present illness.    ROS All other systems reviewed and are negative.   PHYSICAL EXAM:   VS:  BP 124/70   Pulse 94   Wt (!) 322 lb 6 oz (146.2 kg)   SpO2 96%   BMI 55.34 kg/m    GEN: Obese female, NAD HEENT: Normal, atraumatic.  Neck: Thick. JVD hard to assess due to body habitus.  No bruits Cardiac: PMI non palpable, Regular rate and rhythm.  no murmurs, rubs, or gallops. No edema Respiratory: Diminished throughout. Normal effort.  GI: Obese, soft, non tender. Non distended. Bowel sounds present.  MS: no deformity or atrophy  Skin: Warm and dry  Neuro:  Alert and oriented x 3. Strength and sensation are intact no nystagmus  Psych: Anxious, sad.   Wt Readings from Last 3 Encounters:  04/09/17 (!) 322 lb 6 oz (146.2 kg)  04/01/17 (!) 323 lb 6.6 oz (146.7 kg)  03/20/17 (!) 320 lb 6.4 oz (145.3 kg)      Studies/Labs Reviewed:    Recent Labs: 12/19/2016: ALT 10; BUN 13; Creatinine, Ser 1.04; Potassium 3.5; Sodium  141 03/20/2017: Hemoglobin 11.1; Platelets 333   Lipid Panel    Component Value Date/Time   CHOL 166 06/27/2016 0346   TRIG 125 06/27/2016 0346   HDL 33 (L) 06/27/2016 0346   CHOLHDL 5.0 06/27/2016 0346   VLDL 25 06/27/2016 0346   LDLCALC 108 (H) 06/27/2016 0346    Additional studies/ records that were reviewed today include:   2D ECHO: 12/15/2015 LV EF: 55% -   60% Study Conclusions - Left ventricle: The cavity size was normal. Wall thickness was normal. Systolic function was normal. The estimated ejection  fraction was in the [name] of 55% to 60%. - Pulmonary arteries: PA peak pressure: 70 mm Hg (S). - Pericardium, extracardiac: A trivial pericardial effusion was   identified.   2D ECHO 04/05/16 INTERPRETATION:NORMAL LEFT VENTRICULAR FUNCTION WITH MILD LVHSEVERE RV SYSTOLIC DYSFUNCTION (See above) VALVULAR REGURGITATION: TRIVIAL PR, MILD TR NO VALVULAR STENOSIS TRIVIAL PERICARDIAL EFFUSION Compared with prior Echo study on 03/14/2016: RVSP increased from 50 to 69   CATH right heart and coronary angiography: 03/25/2016 [place] Component Name Value Ref [name]  Cardiac Index (l/min/m2) 2.3 L/min/m2  Right Atrium Mean Pressure (mmHg) 11 mmHg   Right Ventricle Systolic Pressure (mmHg) 68 mmHg   Pulmonary Artery Mean Pressure (mmHg) 40 mmHg   Pulmonary Wedge Pressure (mmHg) 14 mmHg   Pulmonary Vascular Resistance (Wood units) 4.7    CT angio 05/15/16 IMPRESSION: Persistent but partially recanalized pulmonary embolus within the right lower lobe pulmonary artery. No new pulmonary embolism is identified. Minimal bibasilar atelectatic changes stable from the prior exam.   V/Q scan 06/27/16 IMPRESSION: Prominent ventilation perfusion mismatch noted on the right. Finding suggests high probability right-sided pulmonary embolus in this patient with known right-sided pulmonary embolus.   ASSESSMENT & PLAN:   1. Dizziness - Dizziness seems to have  worsened since her Adempas was increased to 2.5 mg 2 weeks ago. Discussed with HF PharmD. Will reduce dose back to '2mg'$  TID.   2. Chronic chest pain:  --LHC performed  at [facility] 7/17 - no CAD - Stable, no recent chest pain.   3. 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 RHC with mild to moderate PAH.  -- Reduce Adempas to 2 mg TID.  - Discussed with Dr. BHaroldine [name] will repeat Echo and have her follow up in a month with Dr. BHaroldine [name] She might need a repeat RHC to re look at pressures.  - I have asked her to hold off on Pulmonary rehab for now as she has been hypotensive recently at her last few visits and cannot complete a work out.   4. HTN:  --BP's have been soft at home, running in the 90's, sometimes as low as 80. She stopped taking amlodipine 2 weeks ago and stopped taking metoprolol last week. Will keep her off of these for now.   5. Fibromyalgia/Rheumatoid Arthitis:  - Sees Rheumatology for this.   6. Morbid obesity with OHS:  - She no longer can pursue bariatric surgery due to finances   7. OSA - She has started wearing her CPAP   8. Depression - CSW met with patient. Will start paramedicine and follow up. See CSW note.   EArbutus LeasNP-C 11:05 AM

## 2017-04-09 NOTE — Progress Notes (Signed)
Advanced Heart Failure Medication Review by a Pharmacist  Does the patient  feel that his/her medications are working for him/her?  yes  Has the patient been experiencing any side effects to the medications prescribed?  yes  Does the patient measure his/her own blood pressure or blood glucose at home?  yes   Does the patient have any problems obtaining medications due to transportation or finances?   no  Understanding of regimen: good Understanding of indications: good Potential of compliance: good Patient understands to avoid NSAIDs. Patient understands to avoid decongestants.  Issues to address at subsequent visits: None   Pharmacist comments: Ms. Ureste is a pleasant 56 yo F presenting without a medication list but with good recall of her regimen. She reports good compliance with her regimen but has not taken any amlodipine or metoprolol for at least a week 2/2 to BP in the 80/40s. She has not been able to participate in pulmonary rehab because of her hypotension. Notably, her Adempas dose was increased to 2.5 mg TID on 7/16 which could be contributing to her hypotension. No other medication-related questions or concerns for me at this time.   Ruta Hinds. Velva Harman, PharmD, BCPS, CPP Clinical Pharmacist Pager: [phone] Phone: [phone] 04/09/2017 11:23 AM      Time with patient: 10 minutes Preparation and documentation time: 2 minutes Total time: 12 minutes

## 2017-04-09 NOTE — Patient Instructions (Signed)
Labs today (will call for abnormal results, otherwise no news is good news)  STOP taking amlodipine and metoprolol.   DECREASE Adempas to 2 mg Three Times Daily  Echocardiogram has been ordered for you.  Follow up in 1 Month.

## 2017-04-09 NOTE — Patient Outreach (Addendum)
[facility]) Care Management   04/09/2017  [name] [name] 04-20-1961 [id number]   Home visit completed with patient.   [name] [name] is an 56 y.o. female  Subjective: Patient stated she has been feeling really bad today. She stated that she went to pulmonary rehabilitation as scheduled today, but it was canceled because her blood pressure was low and they sent her to heart failure clinic for an appointment. She stated she has been feeling dizzy a lot lately.  Objective:   Review of Systems  Respiratory: Positive for cough.   Cardiovascular: Positive for leg swelling.       Chronic complaints of chest wall pain related to prior surgery  Neurological: Positive for dizziness.  Psychiatric/Behavioral: Positive for depression.    Physical Exam  Constitutional: She is oriented to person, place, and time. She appears well-developed and well-nourished.  Cardiovascular: Normal rate, regular rhythm and normal heart sounds.   Respiratory: Effort normal and breath sounds normal.  GI: Soft. Bowel sounds are normal.  Musculoskeletal: She exhibits edema.  Neurological: She is alert and oriented to person, place, and time.  Skin: Skin is warm and dry.  Psychiatric: She has a normal mood and affect. Her behavior is normal. Judgment and thought content normal.    Encounter Medications:   Outpatient Encounter Prescriptions as of 04/09/2017  Medication Sig Note  . acetaminophen-codeine (TYLENOL #3) 300-30 MG tablet Take 1 tablet by mouth every 8 (eight) hours as needed for moderate pain. Max total Tyelnol < 2000mg  /day   . albuterol (PROVENTIL HFA;VENTOLIN HFA) 108 (90 Base) MCG/ACT inhaler Inhale 1-2 puffs into the lungs every 6 (six) hours as needed for wheezing or shortness of breath. 01/02/2017: As needed   . atorvastatin (LIPITOR) 20 MG tablet Take 1 tablet (20 mg total) by mouth daily at 6 PM.   . diclofenac (FLECTOR) 1.3 % PTCH Place 1 patch onto the skin 2 (two) times daily.  (Patient not taking: Reported on 04/09/2017)   . docusate sodium (COLACE) 100 MG capsule Take 1 capsule (100 mg total) by mouth every 12 (twelve) hours.   . ferrous sulfate (FERROUSUL) 325 (65 FE) MG tablet Take 1 tablet (325 mg total) by mouth daily with breakfast.   . fluticasone (FLONASE) 50 MCG/ACT nasal spray Place 2 sprays into both nostrils daily.   . furosemide (LASIX) 40 MG tablet Take 1 tablet (40 mg total) by mouth daily. Take extra 40 mg tablet once in the afternoon AS NEEDED for weight gain 3 lbs or more.   . gabapentin (NEURONTIN) 300 MG capsule Take 1 capsule (300 mg total) by mouth 3 (three) times daily. 03/03/2017: Prescription refilled, patient is taking  . loratadine (CLARITIN) 10 MG tablet Take 1 tablet (10 mg total) by mouth daily.   . meclizine (ANTIVERT) 25 MG tablet TAKE ONE TABLET BY MOUTH THREE TIMES DAILY AS NEEDED FOR  DIZZINESS   . potassium chloride (K-DUR) 10 MEQ tablet Take 1 tablet (10 mEq total) by mouth 2 (two) times daily.   . Riociguat (ADEMPAS) 2.5 MG TABS Take 2 mg by mouth 3 (three) times daily.   . rivaroxaban (XARELTO) 20 MG TABS tablet Take 1 tablet (20 mg total) by mouth daily with supper.    No facility-administered encounter medications on file as of 04/09/2017.     Functional Status:   In your present state of health, do you have any difficulty performing the following activities: 10/28/2016 09/24/2016  Hearing? N N  Vision? N N  Difficulty concentrating or making decisions? N N  Walking or climbing stairs? N N  Dressing or bathing? N N  Some recent data might be hidden    Fall/Depression Screening:    Fall Risk  03/03/2017 12/11/2016 07/18/2016  Falls in the past year? Yes No Yes  Comment - - last fall September 2017  Number falls in past yr: 2 or more - 1  Injury with Fall? No - No  Risk Factor Category  - - -  Risk for fall due to : Impaired balance/gait;Impaired mobility - -  Risk for fall due to: Comment - - -  Follow up Falls prevention  discussed - -   PHQ 2/9 Scores 03/03/2017 03/03/2017 01/27/2017 12/11/2016 10/01/2016 08/14/2016 07/05/2016  PHQ - 2 Score 6 5 2 4 3 2 3   PHQ- 9 Score 14 11 10 16 9 5 9     Assessment:   Patient stated she has been feeling very overwhelmed with her healthcare and became tearful when discussing her diagnoses. She stated that she went to pulmonary rehab today and they canceled her visit and sent her to the heart failure clinic because her blood pressure was low and she was feeling dizzy. She stated that she saw the pharmacist, the NP and the social worker during her visit today. Because her blood pressure is low, she is currently stopping her amlodipine and her metoprolol. She stated she had already stopped them for about 2 weeks because she was checking her blood pressure at home and her systolic readings were less than 100 and she was feeling dizzy. Today, she was taken off of them for now. Her Adempas dose was also reduced to 2 mg 3 times a day because they think this may be lowering her blood pressure. She stated that they are going to do an echocardiogram on 05/23/2017 and then she will see Dr. Haroldine Laws on 05/28/2017 to determine if he needs to do another heart catheterization. She stated he wants to keep a check on her pressures and stated that she has been told that because she has already had surgery, that if the Adempas did not work, she would have to be placed on the heart and lung transplant list. 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   Patient is also concerned about pulmonary rehab as she is supposed to go back tomorrow. She stated that she was not sure and was not instructed if she should continue with therapy. RNCM encouraged patient to contact heart failure clinic and/or pulmonary rehab to find out what their recommendations are. Patient attempted to contact pulmonary rehab and left a message for the  nurse who will call her back tomorrow. It was after 5 pm, so RNCM offered to call heart failure clinic for her first thing in the morning so she can get ready and attend her PCP appointment and patient was agreeable and appreciative.  [name] [name] also mentioned that she has had some swelling in her feet off and on and this is causing some concern for her. She has also noted some swelling in her hands. She stated that she keeps getting talked to about heart failure and keeps getting told that she has it, but she is unaware of when she was ever diagnosed. She stated that just hearing the words scared her and she does not really know what that means. RNCM offered support and provided education  about heart failure and the heart failure zones. Advised RNCM could provide some ongoing education about HF to help her better understand what this means for her and how she can manage this better. Patient is already weighing daily, but has not been utilizing her [facility] calendar in some time to document her weights and blood pressures. Encouraged patient to begin tracking her daily weights and educated to weigh daily at about the same time, wearing about the same amount of clothes, first thing in the morning after going to the bathroom and before eating or drinking anything. Patient verbalized understanding. Discussed HF zones and encouraged her to report to heart failure any weight gain of 3 lbs in one day or 5 lbs in one week. She stated that she had a 5 lb weight gain overnight, reporting that at home, she weighed 317 lbs and today at MD appointment, she weighed 322. She did not weigh this morning on her own scales. RNCM educated that this is why it is important to weigh daily on the same scales, at about the same time with the same amount of clothing because there can be variances depending of if/when she ate or drank anything and clothing.   Patient stated that today at HF clinic, that the SW said she was going to refer her  to the paramedicine program. RNCM provided some education about the paramedicine program and why it is beneficial and how they work so closely with the patient and the providers in the HF clinic. Patient is very eager for this program to start and feels better knowing someone from the clinic will be out periodically to check on her at home. She stated this is very beneficial to her because her car is currently broken down and she has to take public transportation to appointments.   Patient stated that she finally did receive her CPAP supplies and has been using her CPAP machine.   Patient stated that she was unable to make it to her rheumatology appointment and had to reschedule. It is currently rescheduled for 04/15/2017, but she is not sure how she will get there. She stated that when it was first scheduled, she had arranged transportation through [facility] and she had received approval for the ride. She stated that she had a company that [facility] is contracted with that does the transportation and [place] had given her the number to call and schedule the ride. However, when she tried to reschedule this appointment, she was told that it was 4 miles out of the way and she could not use them for transportation. She was very upset because the doctor and location had not changed and she was not sure why all of a sudden the office was 4 miles out of her range and she could not get transportation. She is very interested in getting to this appointment and plans to keep it, but will need transportation. RNCM offered to contact the transportation company provided by [facility] and patient looked for the number but could not find it at the moment. She stated she would continue looking and would send it to [facility] when she finds it. RNCM will also collaborate with [facility] SW to determine if there is any additional transportation assistance with a long distance appointment (from [place] to [place]) and patient was  agreeable.  Patient stated that she had been going to see a psychiatrist and counselor, and that she really liked her counselor. She stated that when she tried to schedule a follow up  appointment, her provider was not available until 3 months away so she has quit going. She stated that they would like for her to go to group therapy and she stated she is not comfortable with this and wants 1:1 care. She stated that she has not been to see mental health since May or June and is not interested in going back.   She stated that one of the providers at the heart failure clinic had mentioned the possibility of starting a support group for patients with heart failure and she expressed interested in participating in this. She stated she appreciated all of her nurses and providers trying to offer support and encouragement, but feels she needs to be around and talk with other people who know and understand what she is going through. She stated that if there was a support group at the clinic, she would attend.   Patient has an appointment tomorrow with her new PCP, Dr. Karle Plumber. She stated that Dr. Janne Napoleon was no longer at her practice, so this will be her first visit with her new doctor.   Patient is now actively seeing the following: Dr. Karle Plumber (PCP) Dr. Glori Bickers (Heart failure clinic) Dr. Merrilyn [facility] Pulmonology - but has not seen recently) Rheumatology at [facility] (has not seen yet, but has appointment next week, unsure of provider name)  Patient is scheduled to have her port flushed on 04/17/2017.  RNCM and patient discussed plan of care. Patient would like more education and understanding of heart failure and RNCM offered to assist with this. Care plan as noted below.  Offered to follow up with patient more closely and more often due to her increased feelings of being overwhelmed. Patient agreeable with biweekly calls and/or visits as needed.  Will follow up with a home visit next week for continued education about heart failure and home management and patient agreeable.  Plan:   1) Patient to find number for transportation agency provided by/contracted with [employer] and provide it to [facility] to follow up in why they cannot transport patient to rheumatology visit. 2) RNCM to collaborate with [facility] social worker regarding other potential transportation options and assess if there are any heart failure or pulmonary hypertension support groups that patient may benefit from. 3) RNCM to contact heart failure clinic on behalf of patient to determine if they recommend patient return to pulmonary rehabilitation on 04/10/2017 and let patient know. 4) Follow up home visit scheduled for next week with patient in agreement. 5) RNCM to route note with updated plan of care to PCP.  [facility] CM Care Plan Problem One     Most Recent Value  Care Plan Problem One  Knowledge deficit related to heart failure diagnosis as evidenced by patient acknowledgment that she does not know what "heart failure" means  Role Documenting the Problem One  Care Management Coordinator  Care Plan for Problem One  Active  [facility] Long Term Goal   Patient will verbalize increased knowledge of heart failure, including what heart failure is and how this applies to her within the next 31 days  [facility] Long Term Goal Start Date  04/09/17  Interventions for Problem One Long Term Goal  RNCM provided education about heart failure, covered the heart failure zones and when to call the doctor  [place] CM Short Term Goal #1   Patient will engage with EMT in paramedicine program within the next 2 weeks  [facility] CM Short Term Goal #1 Start  Date  04/10/17  Interventions for Short Term Goal #1  RNCM provided education about paramedicine program including how they work closely with providers at heart failure clinic and the benefits of this program for her  [facility] CM Short Term Goal #2   Patient will verbalize the signs and  symptoms noted in the heart failure yellow zone within the next 2 weeks  [facility] CM Short Term Goal #2 Start Date  04/09/17  Interventions for Short Term Goal #2  RNCM provided education about the heart failure zones and demonstrated how to find the zones in her [facility] calendar notebook for review  [facility] CM Short Term Goal #3  Patient will weigh daily at around the same time and document it in her [facility] calendar book under Heart Failure section (weights) and will notify heart failure clinic of any weight changes per the heart failure action plan for the next 30 days  [facility] CM Short Term Goal #3 Start Date  04/09/17  Interventions for Short Tern Goal #3  RNCM provided education about importance of daily weights and what to look for,  demonstrated how to use [facility] calendar book to document weights in heart failure section and demonstrated how to review the action plan and zones so she would know when to notify the heart failure clinic     [name] Lekendrick Alpern, RN, BSN, [place] Management Coordinator [phone]

## 2017-04-10 ENCOUNTER — Ambulatory Visit: Payer: Medicare HMO | Admitting: Licensed Clinical Social Worker

## 2017-04-10 ENCOUNTER — Ambulatory Visit: Payer: Medicare HMO | Attending: Internal Medicine | Admitting: Internal Medicine

## 2017-04-10 ENCOUNTER — Encounter (HOSPITAL_COMMUNITY)
Admission: RE | Admit: 2017-04-10 | Discharge: 2017-04-10 | Disposition: A | Discharge status: Home / Self Care | Payer: Medicare HMO | Source: Ambulatory Visit | Attending: Internal Medicine | Admitting: Internal Medicine

## 2017-04-10 ENCOUNTER — Other Ambulatory Visit: Payer: Self-pay

## 2017-04-10 ENCOUNTER — Telehealth (HOSPITAL_COMMUNITY): Payer: Self-pay

## 2017-04-10 ENCOUNTER — Encounter: Payer: Self-pay | Admitting: Internal Medicine

## 2017-04-10 VITALS — BP 115/77 | HR 77 | Temp 98.4°F | Resp 16 | Wt 321.8 lb

## 2017-04-10 DIAGNOSIS — F419 Anxiety disorder, unspecified: Secondary | ICD-10-CM | POA: Diagnosis not present

## 2017-04-10 DIAGNOSIS — Z86711 Personal history of pulmonary embolism: Secondary | ICD-10-CM | POA: Diagnosis not present

## 2017-04-10 DIAGNOSIS — G894 Chronic pain syndrome: Secondary | ICD-10-CM | POA: Insufficient documentation

## 2017-04-10 DIAGNOSIS — I10 Essential (primary) hypertension: Secondary | ICD-10-CM | POA: Diagnosis not present

## 2017-04-10 DIAGNOSIS — Z87891 Personal history of nicotine dependence: Secondary | ICD-10-CM | POA: Diagnosis not present

## 2017-04-10 DIAGNOSIS — R42 Dizziness and giddiness: Secondary | ICD-10-CM

## 2017-04-10 DIAGNOSIS — M797 Fibromyalgia: Secondary | ICD-10-CM | POA: Diagnosis not present

## 2017-04-10 DIAGNOSIS — R05 Cough: Secondary | ICD-10-CM

## 2017-04-10 DIAGNOSIS — K5900 Constipation, unspecified: Secondary | ICD-10-CM | POA: Insufficient documentation

## 2017-04-10 DIAGNOSIS — Z818 Family history of other mental and behavioral disorders: Secondary | ICD-10-CM | POA: Diagnosis not present

## 2017-04-10 DIAGNOSIS — F329 Major depressive disorder, single episode, unspecified: Secondary | ICD-10-CM | POA: Diagnosis not present

## 2017-04-10 DIAGNOSIS — Z79899 Other long term (current) drug therapy: Secondary | ICD-10-CM | POA: Diagnosis not present

## 2017-04-10 DIAGNOSIS — F32A Depression, unspecified: Secondary | ICD-10-CM | POA: Insufficient documentation

## 2017-04-10 DIAGNOSIS — Z7901 Long term (current) use of anticoagulants: Secondary | ICD-10-CM | POA: Insufficient documentation

## 2017-04-10 DIAGNOSIS — Z7951 Long term (current) use of inhaled steroids: Secondary | ICD-10-CM | POA: Insufficient documentation

## 2017-04-10 DIAGNOSIS — Z6841 Body Mass Index (BMI) 40.0 and over, adult: Secondary | ICD-10-CM | POA: Insufficient documentation

## 2017-04-10 DIAGNOSIS — Z813 Family history of other psychoactive substance abuse and dependence: Secondary | ICD-10-CM | POA: Insufficient documentation

## 2017-04-10 DIAGNOSIS — I272 Pulmonary hypertension, unspecified: Secondary | ICD-10-CM | POA: Insufficient documentation

## 2017-04-10 DIAGNOSIS — G4733 Obstructive sleep apnea (adult) (pediatric): Secondary | ICD-10-CM | POA: Diagnosis not present

## 2017-04-10 DIAGNOSIS — M069 Rheumatoid arthritis, unspecified: Secondary | ICD-10-CM | POA: Diagnosis not present

## 2017-04-10 DIAGNOSIS — R053 Chronic cough: Secondary | ICD-10-CM | POA: Insufficient documentation

## 2017-04-10 MED ORDER — ALBUTEROL SULFATE HFA 108 (90 BASE) MCG/ACT IN AERS: 1.0000 | INHALATION_SPRAY | Freq: Four times a day (QID) | RESPIRATORY_TRACT | 5 refills | Status: DC | PRN

## 2017-04-10 MED ORDER — ACETAMINOPHEN-CODEINE #3 300-30 MG PO TABS: 1.0000 | ORAL_TABLET | Freq: Three times a day (TID) | ORAL | 2 refills | Status: DC | PRN

## 2017-04-10 MED ORDER — POLYETHYLENE GLYCOL 3350 17 GM/SCOOP PO POWD: 1.0000 | Freq: Once | ORAL | 0 refills | Status: AC

## 2017-04-10 NOTE — Telephone Encounter (Signed)
[facility] RN calling to follow up on patient's ability to return to cardiac/pulm rehab. Patient still reports feeling bad with dizziness, URI, etc (was seen in CHF clinic yesterday and issues were addressed by Jettie Booze NP). Advised patient to rest this week and call MOnday after seeing her PCP and starting meclizine over a few days if s/s still no better.  Renee Pain, RN

## 2017-04-10 NOTE — Patient Instructions (Addendum)
Keep appointment with Rheumatology later this month.   You have been referred to Pulmonary for chronic cough.

## 2017-04-10 NOTE — BH Specialist Note (Signed)
Integrated Behavioral Health Follow Up Visit  MRN: [id number] Name: [name] [name]   Session Start time: 10:30 AM  Session End time: 11:00 AM Total time: 30 minutes Number of [name] [name] visits: 2/10  Type of Service: [place] Interpretor:No. Interpretor Name and Language: N/A    Warm Hand Off Completed.       SUBJECTIVE: [name] [name] is a 56 y.o. female accompanied by patient. Patient was referred by Dr. [name] for depression and anxiety. Patient reports the following symptoms/concerns: overwhelming feelings of sadness and worry, difficulty sleeping, low energy, and irritability  Duration of problem: Ongoing; Severity of problem: moderate  OBJECTIVE: Mood: Anxious and Depressed and Affect: Appropriate Risk of harm to self or others: No plan to harm self or others   LIFE CONTEXT: Family and Social: resides with her spouse. She and husband recently obtained full custody of their 31 year old grandson. Pt has an adult son who travels out of the state often for employer. School/Work: Pt and spouse are on a fixed income. Pt receives Medicaid for minor grandchild Self-Care: Pt is open to re-initiating psychotherapy.  Life Changes: Pt has increased anxiety due to medical health concerns  GOALS ADDRESSED: Patient will reduce symptoms of: anxiety and depression and increase knowledge and/or ability of: coping skills and also: Increase adequate support systems for patient/family  INTERVENTIONS: Solution-Focused Strategies, Supportive Counseling and Link to [employer] Standardized Assessments completed: GAD-7 and PHQ 2&9  ASSESSMENT: Pt currently experiencing depression and anxiety triggered by decline in physical health and chronic pain. Pt reports overwhelming feelings of sadness and worry, difficulty sleeping, low energy, and irritability. Pt may benefit from psychoeduction, psychotherapy, and medication  management. [place] educated pt on how stress can negatively impact one's physical and mental health. Pt reported that she discontinued services (psychotherapy and medication management) at [facility] due to not feeling supported. LCSWA discussed benefits of participating in local woman heart failure support group to increase social support and learn healthy coping skills to manage health. Pt was appreciative for the information and verbalized willingness to initiate psychotherapy with private agency. Pt is aware of community resources for transportation, crisis intervention, therapy, and medication management.  PLAN: 1. Follow up with behavioral health [name] on : Pt was encouraged tocontact LCSWA if symptoms worsen or fail to improveto schedule behavioral appointments at [facility]. 2. Behavioral recommendations: LCSWA recommends that pt apply healthy coping skills discussed and utilize provided resources. Pt is encouraged to schedule follow up appointment with LCSWA 3. Referral(s): Armed forces logistics/support/administrative officer (LME/Outside Clinic) and [employer]:  local support group 4. "From scale of 1-10, how likely are you to follow plan?": 8/10  Rebekah Chesterfield, LCSW 04/14/17 11:35 AM

## 2017-04-10 NOTE — Progress Notes (Signed)
Patient ID: [name] [name], female    DOB: 12-05-1960  MRN: [id number]  CC: re-establish   Subjective: [name] [name] is a 56 y.o. 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 Chronic Cough -worse at nights but during the day she brings up thick white phlegm -Omeprazole and Claritin did not help. Claritin and Flonase helps with sinuses -not on ACE-I -takes Albuterol PRN; averages once a day to QOD.  -recent CXR 03/20/2017 reviewed and no acute ds  2. Has a Smart port for blood draws  3.PAH/CTEPH -was going to pulmonary rehab but had to stop because BP has been low.  Saw cardiology yesterday with complaints of continued dizziness. Diagnosed with viral URI as contributing factor. Given meclizine which helps -Amlodipine, Metoprolol and Adempas decreased -plan for repeat echo to determine need for repeat RT heart cath.   4. RA -diagnosed by a rheumatologist in [facility] several yrs ago. Never on DMARD.  -Has appointment with rheumatologist in [facility] next week but no dependable ride to [place].  Appt 04/15/2017.   -LT hip worse - pinching and burning, Swelling in knuckles and wrist.   -also has fibromyalgia.   -Takes Tylenol threes as needed for her various pains. Requests refill today. Last pain management agreement updated 08/2016  5. Dep/Anxiety: Was going to [place]. Health out-pt with [facility]. Did not like several changes in meds some of which caused wgh gain. She also did not like the fact that they were pushing her to group therapy which he really did not want to do. Preferred one-on-one therapy. Weaned herself off Wellbutrin.  -She is tearful expressing anxiety and  depression about her current medical condition. She is afraid that things are progressing and she may end up on the transplant list. Denies any SI/HI  She is wanting to try an appetite suppressant to get some of the weight off. She is not able to do much in terms of exercise due to her chronic pulmonary/heart condition  Patient Active Problem List   Diagnosis Date Noted  . Adjustment disorder with depressed mood 12/11/2016  . Acute upper respiratory infection 12/11/2016  . GAD (generalized anxiety disorder) 08/14/2016  . Morbid obesity ([place]) 08/14/2016  . Stroke ([place]) 06/27/2016  . Right sided weakness 06/27/2016  . Essential hypertension 06/27/2016  . Atypical chest pain 06/27/2016  . Difficult airway 03/29/2016  . CTEPH (chronic thromboembolic pulmonary hypertension) ([place]) 03/14/2016  . OSA on CPAP 02/20/2016  . Chronic pain syndrome 01/31/2016  . Vaginal bleeding 11/20/2015  . Hematochezia 11/20/2015  . Pulmonary embolus ([place]) 11/15/2015  . Vertigo 11/15/2015  . Depression 11/15/2015     Current Outpatient Prescriptions on File Prior to Visit  Medication Sig Dispense Refill  . atorvastatin (LIPITOR) 20 MG tablet Take 1 tablet (20 mg total) by mouth daily at 6 PM. 90 tablet 2  . diclofenac (FLECTOR) 1.3 % PTCH Place 1 patch onto the skin 2 (two) times daily. (Patient not taking: Reported on 04/09/2017) 60 patch 6  . docusate sodium (COLACE) 100 MG capsule Take 1 capsule (100 mg total) by mouth every 12 (twelve) hours. 30 capsule 0  . ferrous sulfate (FERROUSUL) 325 (65 FE) MG tablet Take 1 tablet (325  mg total) by mouth daily with breakfast. 90 tablet 3  . fluticasone (FLONASE) 50 MCG/ACT nasal spray Place 2 sprays into both nostrils daily. 16 g 6  . furosemide (LASIX) 40 MG tablet Take 1 tablet (40 mg total) by mouth daily. Take extra 40 mg tablet once in the afternoon AS NEEDED for weight gain 3 lbs or more. 60 tablet 3  . gabapentin (NEURONTIN) 300 MG capsule Take 1 capsule (300  mg total) by mouth 3 (three) times daily. 90 capsule 1  . loratadine (CLARITIN) 10 MG tablet Take 1 tablet (10 mg total) by mouth daily. 30 tablet 11  . meclizine (ANTIVERT) 25 MG tablet TAKE ONE TABLET BY MOUTH THREE TIMES DAILY AS NEEDED FOR  DIZZINESS 60 tablet 0  . potassium chloride (K-DUR) 10 MEQ tablet Take 1 tablet (10 mEq total) by mouth 2 (two) times daily. 60 tablet 1  . Riociguat (ADEMPAS) 2.5 MG TABS Take 2 mg by mouth 3 (three) times daily. 90 tablet 3  . rivaroxaban (XARELTO) 20 MG TABS tablet Take 1 tablet (20 mg total) by mouth daily with supper. 90 tablet 3   No current facility-administered medications on file prior to visit.     No Known Allergies  Social History   Social History  . Marital status: Married    Spouse name: N/A  . Number of children: N/A  . Years of education: N/A   Occupational History  . Not on file.   Social History Main Topics  . Smoking status: Never Smoker  . Smokeless tobacco: Never Used  . Alcohol use No  . Drug use: No  . Sexual activity: Yes    Partners: Male    Birth control/ protection: Surgical   Other Topics Concern  . Not on file   Social History Narrative  . No narrative on file    Family History  Problem Relation Age of Onset  . Lung cancer Maternal Grandmother   . Dementia Mother   . Dementia Father   . Anxiety disorder Sister   . Bipolar disorder Sister   . Drug abuse Sister   . Sexual abuse Maternal Aunt   . Anxiety disorder Cousin   . Anxiety disorder Sister   . Bipolar disorder Sister   . Drug abuse Sister     Past Surgical History:  Procedure Laterality Date  . ABDOMINAL HYSTERECTOMY    . BREAST REDUCTION SURGERY    . CHOLECYSTECTOMY    . EMBOLECTOMY N/A    pulmonary embolectomy  . RIGHT HEART CATH N/A 10/28/2016   Procedure: Right Heart Cath;  Surgeon: Jolaine Artist, MD;  Location: [place] [facility];  Service: Cardiovascular;  Laterality: N/A;    ROS: Review of Systems Negative except as  stated above  PHYSICAL EXAM: BP 115/77   Pulse 77   Temp 98.4 F (36.9 C) (Oral)   Resp 16   Wt (!) 321 lb 12.8 oz (146 kg)   SpO2 95%   BMI 55.24 kg/m   Wt Readings from Last 3 Encounters:  04/10/17 (!) 321 lb 12.8 oz (146 kg)  04/09/17 (!) 322 lb 6 oz (146.2 kg)  04/09/17 (!) 322 lb (146.1 kg)   Physical Exam  General appearance - alert, well appearing, obese middle-age African-American female and in no distress Mental status - patient tearful at times Mouth - mucous membranes moist, pharynx normal without lesions Neck - supple, no significant adenopathy Chest - clear to auscultation, no wheezes, rales or rhonchi, symmetric  air entry Heart - normal rate, regular rhythm, normal S1, S2,  Musculoskeletal - ambulates with a cane. Slow with getting up from chair and transferred to exam table Extremities - trace lower extremity edema Depression screen [facility] 2/9 04/10/2017  Decreased Interest 3  Down, Depressed, Hopeless 3  PHQ - 2 Score 6  Altered sleeping 2  Tired, decreased energy 2  Change in appetite 2  Feeling bad or failure about yourself  1  Trouble concentrating 1  Moving slowly or fidgety/restless 0  Suicidal thoughts 0  PHQ-9 Score 14  Difficult doing work/chores -  Some recent data might be hidden   GAD 7 : Generalized Anxiety Score 04/10/2017 03/03/2017 01/27/2017 12/11/2016  Nervous, Anxious, on Edge 3 2 1 2   Control/stop worrying 3 3 2 2   Worry too much - different things 3 3 3 2   Trouble relaxing 3 2 3 2   Restless 1 0 0 0  Easily annoyed or irritable 3 3 3 1   Afraid - awful might happen 3 3 2 3   Total GAD 7 Score 19 16 14 12   Anxiety Difficulty - - - -     ASSESSMENT AND PLAN: 1. Chronic cough -She has been tried with treatment for GERD, postnasal drip and albuterol inhaler with no improvement. Will refer to pulmonary - Ambulatory referral to Pulmonology - albuterol (PROVENTIL HFA;VENTOLIN HFA) 108 (90 Base) MCG/ACT inhaler; Inhale 1-2 puffs into the lungs  every 6 (six) hours as needed for wheezing or shortness of breath.  Dispense: 6.7 g; Refill: 5  2. Dizziness -Multifactorial. Followed by cardiology. Patient reports a dose of Adempas was recently decreased -Continue meclizine when necessary 3. Rheumatoid arthritis involving multiple sites, unspecified rheumatoid factor presence [facility]) -Encourage patient to keep appointment in [facility]. She will try make arrangements for transportation  4. Anxiety and depression -LCSW to see patient today to discuss other community options. She declines any medication at this time  5. Morbid obesity with BMI of 50.0-59.9, adult [facility]) -Advised patient to find out from her cardiologist whether it would be safe for [place] to use Phentermine as appetite suppressant given her chronic medical issues  6. Chronic pain syndrome - acetaminophen-codeine (TYLENOL #3) 300-30 MG tablet; Take 1 tablet by mouth every 8 (eight) hours as needed for moderate pain. Max total Tyelnol < 2000mg  /day  Dispense: 50 tablet; Refill: 2  7. Constipation, unspecified constipation type - polyethylene glycol powder (MIRALAX) powder; Take 255 g by mouth once.  Dispense: 255 g; Refill: 0  Patient was given the opportunity to ask questions.  Patient verbalized understanding of the plan and was able to repeat key elements of the plan.   Orders Placed This Encounter  Procedures  . Ambulatory referral to Pulmonology     Requested Prescriptions   Signed Prescriptions Disp Refills  . albuterol (PROVENTIL HFA;VENTOLIN HFA) 108 (90 Base) MCG/ACT inhaler 6.7 g 5    Sig: Inhale 1-2 puffs into the lungs every 6 (six) hours as needed for wheezing or shortness of breath.  . polyethylene glycol powder (MIRALAX) powder 255 g 0    Sig: Take 255 g by mouth once.  Marland Kitchen acetaminophen-codeine (TYLENOL #3) 300-30 MG tablet 50 tablet 2    Sig: Take 1 tablet by mouth every 8 (eight) hours as needed for moderate pain. Max total Tyelnol < 2000mg  /day     F/u in 3 mths Karle Plumber, MD, Rosalita Chessman

## 2017-04-10 NOTE — Patient Outreach (Signed)
[facility]) Care Management   04/10/17  Jaedan Schuman October 07, 1960 [id number]  RNCM contacted the heart failure clinic and spoke with [facility]. Advised of patient concerns and unsure if she was supposed to return to pulmonary rehabilitation today. Per Jinny Blossom, ok to not go today due to patient's multiple complaints and issues with dizziness. If still not feeling well on Monday, give the office a call to discuss further about when it would be safe to return. RNCM to update patient.  Patient stated that she likes her new PCP and felt she was very thorough. She stated that she gave her printouts for the heart failure clinic because they have groups with women who have similar issues. She stated the group is called [facility]. Patient stated that she believes she would feel better if she had other women she could talk to who have experienced what she is going trough so that they could help support each other.   RNCM educated patient regarding holding off on pulmonary rehabilitation at present due to dizziness and she reported that she had already decided not to go.  Patient denies any other concerns or complaints at present.  Patient currently has no questions or concerns. She will follow up with her heart doctor about when to resume rehabilitation.   Plan: Will continue to follow patient for coordination of care and to provide education and support.  [name] Florance Paolillo, RN, BSN, [place] Management Coordinator [phone]

## 2017-04-10 NOTE — Progress Notes (Signed)
Pulmonary rehab Note: Patient called this am to discuss medical issues as it relates to her ability to participate in pulmonary rehab. She stated her BP remains low and her medications were adjusted yesterday in the Heart Failure clinic. She states she also has an appointment with her primary today, and an Echo on August 13, all in relation to her low BP and dizziness with shortness of breath. It was discussed for both safety and patients general malaise that she will be placed on medical hold until her Echo is resulted and until she follows up with HF MD. Patient instructed to contact pulmonary rehab sooner if her symptoms resolved with medication adjustment and she wished to return to her exercise sooner. Will continue to follow remotely.

## 2017-04-11 ENCOUNTER — Other Ambulatory Visit: Payer: Self-pay | Admitting: Internal Medicine

## 2017-04-11 DIAGNOSIS — Z1231 Encounter for screening mammogram for malignant neoplasm of breast: Secondary | ICD-10-CM

## 2017-04-15 ENCOUNTER — Other Ambulatory Visit (HOSPITAL_COMMUNITY): Payer: Self-pay

## 2017-04-15 ENCOUNTER — Encounter (HOSPITAL_COMMUNITY): Payer: Medicare HMO

## 2017-04-15 NOTE — Progress Notes (Signed)
Paramedicine Encounter    Patient ID: [name] [name], female    DOB: 11/13/1960, 56 y.o.   MRN: [id number]   Patient Care Team: Ladell Pier, MD as PCP - General (Internal Medicine) Loletta Specter, RN as [place] Management  Patient Active Problem List   Diagnosis Date Noted  . Chronic cough 04/10/2017  . Rheumatoid arthritis involving multiple sites ([place]) 04/10/2017  . Anxiety and depression 04/10/2017  . Adjustment disorder with depressed mood 12/11/2016  . Acute upper respiratory infection 12/11/2016  . GAD (generalized anxiety disorder) 08/14/2016  . Morbid obesity ([place]) 08/14/2016  . Stroke ([place]) 06/27/2016  . Right sided weakness 06/27/2016  . Essential hypertension 06/27/2016  . Atypical chest pain 06/27/2016  . Difficult airway 03/29/2016  . CTEPH (chronic thromboembolic pulmonary hypertension) ([place]) 03/14/2016  . OSA on CPAP 02/20/2016  . Chronic pain syndrome 01/31/2016  . Vaginal bleeding 11/20/2015  . Hematochezia 11/20/2015  . Pulmonary embolus ([place]) 11/15/2015  . Vertigo 11/15/2015  . Depression 11/15/2015    Current Outpatient Prescriptions:  .  acetaminophen-codeine (TYLENOL #3) 300-30 MG tablet, Take 1 tablet by mouth every 8 (eight) hours as needed for moderate pain. Max total Tyelnol < 2000mg  /day, Disp: 50 tablet, Rfl: 2 .  albuterol (PROVENTIL HFA;VENTOLIN HFA) 108 (90 Base) MCG/ACT inhaler, Inhale 1-2 puffs into the lungs every 6 (six) hours as needed for wheezing or shortness of breath., Disp: 6.7 g, Rfl: 5 .  atorvastatin (LIPITOR) 20 MG tablet, Take 1 tablet (20 mg total) by mouth daily at 6 PM., Disp: 90 tablet, Rfl: 2 .  docusate sodium (COLACE) 100 MG capsule, Take 1 capsule (100 mg total) by mouth every 12 (twelve) hours., Disp: 30 capsule, Rfl: 0 .  ferrous sulfate (FERROUSUL) 325 (65 FE) MG tablet, Take 1 tablet (325 mg total) by mouth daily with breakfast., Disp: 90 tablet, Rfl: 3 .  fluticasone (FLONASE) 50  MCG/ACT nasal spray, Place 2 sprays into both nostrils daily., Disp: 16 g, Rfl: 6 .  furosemide (LASIX) 40 MG tablet, Take 1 tablet (40 mg total) by mouth daily. Take extra 40 mg tablet once in the afternoon AS NEEDED for weight gain 3 lbs or more., Disp: 60 tablet, Rfl: 3 .  gabapentin (NEURONTIN) 300 MG capsule, Take 1 capsule (300 mg total) by mouth 3 (three) times daily., Disp: 90 capsule, Rfl: 1 .  loratadine (CLARITIN) 10 MG tablet, Take 1 tablet (10 mg total) by mouth daily., Disp: 30 tablet, Rfl: 11 .  meclizine (ANTIVERT) 25 MG tablet, TAKE ONE TABLET BY MOUTH THREE TIMES DAILY AS NEEDED FOR  DIZZINESS, Disp: 60 tablet, Rfl: 0 .  potassium chloride (K-DUR) 10 MEQ tablet, Take 1 tablet (10 mEq total) by mouth 2 (two) times daily., Disp: 60 tablet, Rfl: 1 .  Riociguat (ADEMPAS) 2.5 MG TABS, Take 2 mg by mouth 3 (three) times daily., Disp: 90 tablet, Rfl: 3 .  rivaroxaban (XARELTO) 20 MG TABS tablet, Take 1 tablet (20 mg total) by mouth daily with supper., Disp: 90 tablet, Rfl: 3 .  diclofenac (FLECTOR) 1.3 % PTCH, Place 1 patch onto the skin 2 (two) times daily. (Patient not taking: Reported on 04/09/2017), Disp: 60 patch, Rfl: 6 No Known Allergies   Social History   Social History  . Marital status: Married    Spouse name: N/A  . Number of children: N/A  . Years of education: N/A   Occupational History  . Not on file.   Social History Main  Topics  . Smoking status: Never Smoker  . Smokeless tobacco: Never Used  . Alcohol use No  . Drug use: No  . Sexual activity: Yes    Partners: Male    Birth control/ protection: Surgical   Other Topics Concern  . Not on file   Social History Narrative  . No narrative on file    Physical Exam  Constitutional: She is oriented to person, place, and time. She appears well-developed.  Cardiovascular: Normal rate and regular rhythm.   Pulmonary/Chest: Effort normal and breath sounds normal. No respiratory distress. She has no wheezes. She  has no rales.  Abdominal: Soft. She exhibits no distension. There is no tenderness.  Musculoskeletal: Normal range of motion. She exhibits edema.  Neurological: She is alert and oriented to person, place, and time.  Skin: Skin is warm and dry. No erythema.        Future Appointments Date Time Provider [place]  04/16/2017 11:30 AM Loletta Specter, RN THN-COM None  04/17/2017 10:00 AM WL-MDCC ROOM WL-MDCC None  04/17/2017 1:30 PM MC-PULMONARY REHAB UNDERGRAD MC-REHSC None  04/22/2017 1:30 PM MC-PULMONARY REHAB UNDERGRAD MC-REHSC None  04/22/2017 3:00 PM MC ECHO 1-BUZZ MC-ECHOLAB MCH  04/24/2017 1:30 PM MC-PULMONARY REHAB UNDERGRAD MC-REHSC None  04/25/2017 11:10 AM GI-BCG MM 3 GI-BCGMM GI-BREAST CE  04/29/2017 1:30 PM MC-PULMONARY REHAB UNDERGRAD MC-REHSC None  05/01/2017 1:30 PM MC-PULMONARY REHAB UNDERGRAD MC-REHSC None  05/05/2017 10:00 AM Ladell Pier, MD CHW-CHWW None  05/06/2017 1:30 PM MC-PULMONARY REHAB UNDERGRAD MC-REHSC None  05/08/2017 1:30 PM MC-PULMONARY REHAB UNDERGRAD MC-REHSC None  05/13/2017 1:30 PM MC-PULMONARY REHAB UNDERGRAD MC-REHSC None  05/15/2017 1:30 PM MC-PULMONARY REHAB UNDERGRAD MC-REHSC None  05/20/2017 1:30 PM MC-PULMONARY REHAB UNDERGRAD MC-REHSC None  05/22/2017 10:00 AM WL-MDCC ROOM WL-MDCC None  05/22/2017 1:30 PM MC-PULMONARY REHAB UNDERGRAD MC-REHSC None  05/27/2017 1:30 PM MC-PULMONARY REHAB UNDERGRAD MC-REHSC None  05/28/2017 9:00 AM Bensimhon, Shaune Pascal, MD MC-HVSC None  05/29/2017 1:30 PM MC-PULMONARY REHAB UNDERGRAD MC-REHSC None  06/03/2017 1:30 PM MC-PULMONARY REHAB UNDERGRAD MC-REHSC None  06/05/2017 1:30 PM MC-PULMONARY REHAB UNDERGRAD MC-REHSC None  06/10/2017 1:30 PM MC-PULMONARY REHAB UNDERGRAD MC-REHSC None  06/12/2017 1:30 PM MC-PULMONARY REHAB UNDERGRAD MC-REHSC None  06/17/2017 1:30 PM MC-PULMONARY REHAB UNDERGRAD MC-REHSC None  06/19/2017 1:30 PM MC-PULMONARY REHAB UNDERGRAD MC-REHSC None  06/24/2017 1:30 PM MC-PULMONARY REHAB  UNDERGRAD MC-REHSC None  06/26/2017 1:30 PM MC-PULMONARY REHAB UNDERGRAD MC-REHSC None   BP 110/82 (BP Location: Right Arm, Patient Position: Sitting, Cuff Size: Large)   Pulse 74   Resp 18   Wt (!) 322 lb (146.1 kg)   SpO2 98%   BMI 55.27 kg/m  Weight yesterday-  314 lbs Last visit weight- N/A  This was the first visit with Ms Serres at her home, which meant extra time was required. She reports feeling dizzy and weak "all the time" but denied SOB while at rest. She advised she is currently out of cardiac rehab due to hypotension and she has been taken off certain medications. She is compliant with her medications and has a pillbox which she normally fills. She does have difficulty obtaining pain patches that she needs for degenerative disk disease, primarily due to financial hardships.   She stated she has been denied medicaid in the past due to a property in her name which is being foreclosed on. She currently has [employer] and receives disability income. She also receives $181 per month for child assistance and $40 in food stamps. She  stated she has a hard time putting food in the pantry and getting clothes for her 32 y/o grandson, of whom she has guardianship.  She also needs a large shower chair because she feels unsafe taking a shower. No grab-bars are present and added stability in the bathroom, so this is another area of concern.   Her medications were verified and her pillbox was filled. I told her EMS would bring a bag of school supplies from the HF clinic later this week and coordinate with local food pantries to help her get healthy food options.   Time spent with patient: 100 minutes  [name], EMT 04/15/17  ACTION: Home visit completed Next visit planned for 3 days

## 2017-04-16 ENCOUNTER — Ambulatory Visit: Payer: Self-pay

## 2017-04-17 ENCOUNTER — Ambulatory Visit (HOSPITAL_COMMUNITY)
Admission: RE | Admit: 2017-04-17 | Discharge: 2017-04-17 | Disposition: A | Discharge status: Home / Self Care | Payer: Medicare HMO | Source: Ambulatory Visit | Attending: Internal Medicine | Admitting: Internal Medicine

## 2017-04-17 ENCOUNTER — Encounter (HOSPITAL_COMMUNITY): Payer: Self-pay

## 2017-04-17 ENCOUNTER — Encounter (HOSPITAL_COMMUNITY): Payer: Medicare HMO

## 2017-04-17 DIAGNOSIS — I2782 Chronic pulmonary embolism: Secondary | ICD-10-CM | POA: Diagnosis not present

## 2017-04-17 DIAGNOSIS — Z452 Encounter for adjustment and management of vascular access device: Secondary | ICD-10-CM | POA: Insufficient documentation

## 2017-04-17 MED ORDER — HEPARIN SOD (PORK) LOCK FLUSH 100 UNIT/ML IV SOLN
500.0000 [IU] | INTRAVENOUS | Status: DC
  Administered 2017-04-17: 500 [IU]
  Filled 2017-04-17: qty 5

## 2017-04-17 MED ORDER — SODIUM CHLORIDE 0.9% FLUSH
10.0000 mL | INTRAVENOUS | Status: DC
  Administered 2017-04-17: 10 mL via INTRAVENOUS

## 2017-04-21 ENCOUNTER — Other Ambulatory Visit (HOSPITAL_COMMUNITY): Payer: Self-pay

## 2017-04-21 ENCOUNTER — Telehealth (HOSPITAL_COMMUNITY): Payer: Self-pay | Admitting: Pharmacist

## 2017-04-21 NOTE — Telephone Encounter (Signed)
Received a message from [place] that Ms. Kuriakose was wanting to know if she could safely take phentermine for weight loss. Unfortunately, there is a risk of cardiotoxicity with phentermine, specifically heart failure and pulmonary hypertension, which she already has. For these reasons, Dr. Haroldine Laws and I feel like this would not be a good option for her at this time.   Ruta Hinds. Velva Harman, PharmD, BCPS, CPP Clinical Pharmacist Pager: [phone] Phone: [phone] 04/21/2017 10:30 AM

## 2017-04-21 NOTE — Progress Notes (Signed)
Paramedicine Encounter    Patient ID: [name] [name], female    DOB: 02/17/1961, 56 y.o.   MRN: [id number]   Patient Care Team: Ladell Pier, MD as PCP - General (Internal Medicine) Loletta Specter, RN as [place] Management  Patient Active Problem List   Diagnosis Date Noted  . Chronic cough 04/10/2017  . Rheumatoid arthritis involving multiple sites ([place]) 04/10/2017  . Anxiety and depression 04/10/2017  . Adjustment disorder with depressed mood 12/11/2016  . Acute upper respiratory infection 12/11/2016  . GAD (generalized anxiety disorder) 08/14/2016  . Morbid obesity ([place]) 08/14/2016  . Stroke ([place]) 06/27/2016  . Right sided weakness 06/27/2016  . Essential hypertension 06/27/2016  . Atypical chest pain 06/27/2016  . Difficult airway 03/29/2016  . CTEPH (chronic thromboembolic pulmonary hypertension) ([place]) 03/14/2016  . OSA on CPAP 02/20/2016  . Chronic pain syndrome 01/31/2016  . Vaginal bleeding 11/20/2015  . Hematochezia 11/20/2015  . Pulmonary embolus ([place]) 11/15/2015  . Vertigo 11/15/2015  . Depression 11/15/2015    Current Outpatient Prescriptions:  .  acetaminophen-codeine (TYLENOL #3) 300-30 MG tablet, Take 1 tablet by mouth every 8 (eight) hours as needed for moderate pain. Max total Tyelnol < 2000mg  /day, Disp: 50 tablet, Rfl: 2 .  albuterol (PROVENTIL HFA;VENTOLIN HFA) 108 (90 Base) MCG/ACT inhaler, Inhale 1-2 puffs into the lungs every 6 (six) hours as needed for wheezing or shortness of breath., Disp: 6.7 g, Rfl: 5 .  atorvastatin (LIPITOR) 20 MG tablet, Take 1 tablet (20 mg total) by mouth daily at 6 PM., Disp: 90 tablet, Rfl: 2 .  docusate sodium (COLACE) 100 MG capsule, Take 1 capsule (100 mg total) by mouth every 12 (twelve) hours., Disp: 30 capsule, Rfl: 0 .  ferrous sulfate (FERROUSUL) 325 (65 FE) MG tablet, Take 1 tablet (325 mg total) by mouth daily with breakfast., Disp: 90 tablet, Rfl: 3 .  fluticasone (FLONASE) 50  MCG/ACT nasal spray, Place 2 sprays into both nostrils daily., Disp: 16 g, Rfl: 6 .  furosemide (LASIX) 40 MG tablet, Take 1 tablet (40 mg total) by mouth daily. Take extra 40 mg tablet once in the afternoon AS NEEDED for weight gain 3 lbs or more., Disp: 60 tablet, Rfl: 3 .  gabapentin (NEURONTIN) 300 MG capsule, Take 1 capsule (300 mg total) by mouth 3 (three) times daily., Disp: 90 capsule, Rfl: 1 .  loratadine (CLARITIN) 10 MG tablet, Take 1 tablet (10 mg total) by mouth daily., Disp: 30 tablet, Rfl: 11 .  meclizine (ANTIVERT) 25 MG tablet, TAKE ONE TABLET BY MOUTH THREE TIMES DAILY AS NEEDED FOR  DIZZINESS, Disp: 60 tablet, Rfl: 0 .  potassium chloride (K-DUR) 10 MEQ tablet, Take 1 tablet (10 mEq total) by mouth 2 (two) times daily., Disp: 60 tablet, Rfl: 1 .  Riociguat (ADEMPAS) 2.5 MG TABS, Take 2 mg by mouth 3 (three) times daily., Disp: 90 tablet, Rfl: 3 .  rivaroxaban (XARELTO) 20 MG TABS tablet, Take 1 tablet (20 mg total) by mouth daily with supper., Disp: 90 tablet, Rfl: 3 .  diclofenac (FLECTOR) 1.3 % PTCH, Place 1 patch onto the skin 2 (two) times daily. (Patient not taking: Reported on 04/09/2017), Disp: 60 patch, Rfl: 6 No Known Allergies   Social History   Social History  . Marital status: Married    Spouse name: N/A  . Number of children: N/A  . Years of education: N/A   Occupational History  . Not on file.   Social History Main  Topics  . Smoking status: Never Smoker  . Smokeless tobacco: Never Used  . Alcohol use No  . Drug use: No  . Sexual activity: Yes    Partners: Male    Birth control/ protection: Surgical   Other Topics Concern  . Not on file   Social History Narrative  . No narrative on file    Physical Exam  Constitutional: She is oriented to person, place, and time.  Cardiovascular: Normal rate and regular rhythm.   Pulmonary/Chest: Effort normal and breath sounds normal. No respiratory distress. She has no wheezes. She has no rales.  Abdominal:  Soft.  Musculoskeletal: Normal range of motion. She exhibits edema.  Neurological: She is alert and oriented to person, place, and time.  Skin: Skin is warm and dry.        SAFE - 04/15/17 1600      Situation   Admitting diagnosis chf   Heart failure history Exisiting   Comorbidities HTN;Hx DVT/PE;Sleep Apnea   Readmitted within 30 days No   Hospital admission within past 12 months Yes   number of hospital admissions 1   number of ED visits 4     Assessment   Lives alone No   Primary support person Husband   Mode of transportation personal car;scat;family/friends   Other services involved Other   Home equipement Cane;Scale;Walker     Weight   Weighs self daily Yes   Scale provided No   Records on weight chart Yes     Resources   Has "Living better w/heart failure" book Yes   Has HF Zone tool Yes   Able to identify yellow zone signs/when to call MD Yes   Records zone daily No     Medications   Uses a pill box Yes   Who stocks the pill box Paramedicine   Pill box checked this visit Yes   Pill box refilled this visit Yes   Difficulty obtaining medications Yes   Barriers to medication Financial   Community Paramedic obtains medications from pharmacy No   Mail order medications Yes   New medications at home today No   Which ones [facility]   Next scheduled delivery of medications 05/09/17   Missed one or more doses of medications per week No     Nutrition   Patient receives meals on wheels No   Patient follows low sodium diet Yes   Has foods at home that meet the current recommended diet Yes   Patient follows low sugar/card diet No   Nutritional concerns/issues Affordability     Activity Level   ADL's/Mobility Independent   How many feet can patient ambulate 20 feet   Typical activity level Inactive     Urine   Difficulty urinating No   Changes in urine None     Time spent with patient   Time spent with patient  90 Minutes        Future  Appointments Date Time Provider [place]  04/22/2017 1:30 PM MC-PULMONARY REHAB UNDERGRAD MC-REHSC None  04/22/2017 3:00 PM MC ECHO 1-BUZZ MC-ECHOLAB [facility]  04/24/2017 1:30 PM MC-PULMONARY REHAB UNDERGRAD MC-REHSC None  04/25/2017 11:10 AM GI-BCG MM 3 GI-BCGMM GI-BREAST CE  04/29/2017 1:30 PM MC-PULMONARY REHAB UNDERGRAD MC-REHSC None  05/01/2017 1:30 PM MC-PULMONARY REHAB UNDERGRAD MC-REHSC None  05/05/2017 10:00 AM Ladell Pier, MD CH[facility]-CHWW None  05/06/2017 1:30 PM MC-PULMONARY REHAB UNDERGRAD MC-REHSC None  05/08/2017 1:30 PM MC-PULMONARY REHAB UNDERGRAD MC-REHSC None  05/13/2017 1:30 PM MC-PULMONARY  REHAB UNDERGRAD MC-REHSC None  05/15/2017 1:30 PM MC-PULMONARY REHAB UNDERGRAD MC-REHSC None  05/20/2017 1:30 PM MC-PULMONARY REHAB UNDERGRAD MC-REHSC None  05/22/2017 10:00 AM WL-MDCC ROOM WL-MDCC None  05/22/2017 1:30 PM MC-PULMONARY REHAB UNDERGRAD MC-REHSC None  05/27/2017 1:30 PM MC-PULMONARY REHAB UNDERGRAD MC-REHSC None  05/28/2017 9:00 AM Bensimhon, Shaune Pascal, MD MC-HVSC None  05/29/2017 1:30 PM MC-PULMONARY REHAB UNDERGRAD MC-REHSC None  06/03/2017 1:30 PM MC-PULMONARY REHAB UNDERGRAD MC-REHSC None  06/05/2017 1:30 PM MC-PULMONARY REHAB UNDERGRAD MC-REHSC None  06/10/2017 1:30 PM MC-PULMONARY REHAB UNDERGRAD MC-REHSC None  06/12/2017 1:30 PM MC-PULMONARY REHAB UNDERGRAD MC-REHSC None  06/17/2017 1:30 PM MC-PULMONARY REHAB UNDERGRAD MC-REHSC None  06/19/2017 1:30 PM MC-PULMONARY REHAB UNDERGRAD MC-REHSC None  06/20/2017 9:00 AM WL-MDCC ROOM WL-MDCC None  06/24/2017 1:30 PM MC-PULMONARY REHAB UNDERGRAD MC-REHSC None  06/26/2017 1:30 PM MC-PULMONARY REHAB UNDERGRAD MC-REHSC None  07/23/2017 9:00 AM WL-MDCC ROOM WL-MDCC None  08/22/2017 9:00 AM WL-MDCC ROOM WL-MDCC None   BP 110/74 (BP Location: Right Arm, Patient Position: Sitting, Cuff Size: Large)   Pulse 78   Resp 16   Wt (!) 324 lb 9.6 oz (147.2 kg)   SpO2 98%   BMI 55.72 kg/m  Weight yesterday- Did not weigh Last visit weight-  322 lbs   Mrs. Paino was seen at home today and reports feeling unwell for the past 3 days. She is complaining mostly of RUQ pain, dizziness and increased orthopnea. She denied headache and SOB at rest. Her weight was up by 2.6 lbs since her last visit with paramedicine but she reports having large fluctuations (up to 50 lbs) over the past week. I had her weigh again with me present and the weight was within a reasonable range from last week. I assume she is having trouble with an uneven space where she had been weighing and have instructed that she weigh on the tile floor in the kitchen. Her medications were verified and her pillbox was refilled.  She asked I look into a program for people with low income because their only mode of transportation recently broke down. I delivered shoes from the clinic for her grandson as well. She gave me her landlord's contact information to ask permission to get grab-bars for the shower. The name is Pola Corn and the phone number is [phone].  Time spent with patient: 48 minutes  [name], EMT 04/21/17  ACTION: Home visit completed Next visit planned for 1 week

## 2017-04-22 ENCOUNTER — Encounter (HOSPITAL_COMMUNITY): Payer: Medicare HMO

## 2017-04-22 ENCOUNTER — Ambulatory Visit (HOSPITAL_COMMUNITY)
Admission: RE | Admit: 2017-04-22 | Discharge: 2017-04-22 | Disposition: A | Discharge status: Home / Self Care | Payer: Medicare HMO | Source: Ambulatory Visit | Attending: Cardiology | Admitting: Cardiology

## 2017-04-22 DIAGNOSIS — I5032 Chronic diastolic (congestive) heart failure: Secondary | ICD-10-CM

## 2017-04-22 DIAGNOSIS — I081 Rheumatic disorders of both mitral and tricuspid valves: Secondary | ICD-10-CM | POA: Insufficient documentation

## 2017-04-22 MED ORDER — PERFLUTREN LIPID MICROSPHERE
1.0000 mL | INTRAVENOUS | Status: AC | PRN
  Administered 2017-04-22: 2 mL via INTRAVENOUS
  Filled 2017-04-22: qty 10

## 2017-04-22 NOTE — Progress Notes (Signed)
  Echocardiogram 2D Echocardiogram has been performed.  [name] [name] 04/22/2017, 4:30 PM

## 2017-04-24 ENCOUNTER — Encounter (HOSPITAL_COMMUNITY): Payer: Medicare HMO

## 2017-04-25 ENCOUNTER — Ambulatory Visit: Payer: Self-pay

## 2017-04-28 ENCOUNTER — Other Ambulatory Visit (HOSPITAL_COMMUNITY): Payer: Self-pay

## 2017-04-28 ENCOUNTER — Ambulatory Visit
Admission: RE | Admit: 2017-04-28 | Discharge: 2017-04-28 | Disposition: A | Discharge status: Home / Self Care | Payer: Medicare HMO | Source: Ambulatory Visit | Attending: Internal Medicine | Admitting: Internal Medicine

## 2017-04-28 DIAGNOSIS — Z1231 Encounter for screening mammogram for malignant neoplasm of breast: Secondary | ICD-10-CM | POA: Diagnosis not present

## 2017-04-28 NOTE — Progress Notes (Signed)
Paramedicine Encounter    Patient ID: [name] [name], female    DOB: 1961/04/14, 56 y.o.   MRN: [id number]   Patient Care Team: Ladell Pier, MD as PCP - General (Internal Medicine) Loletta Specter, RN as [place] Management  Patient Active Problem List   Diagnosis Date Noted  . Chronic cough 04/10/2017  . Rheumatoid arthritis involving multiple sites ([place]) 04/10/2017  . Anxiety and depression 04/10/2017  . Adjustment disorder with depressed mood 12/11/2016  . Acute upper respiratory infection 12/11/2016  . GAD (generalized anxiety disorder) 08/14/2016  . Morbid obesity ([place]) 08/14/2016  . Stroke ([place]) 06/27/2016  . Right sided weakness 06/27/2016  . Essential hypertension 06/27/2016  . Atypical chest pain 06/27/2016  . Difficult airway 03/29/2016  . CTEPH (chronic thromboembolic pulmonary hypertension) ([place]) 03/14/2016  . OSA on CPAP 02/20/2016  . Chronic pain syndrome 01/31/2016  . Vaginal bleeding 11/20/2015  . Hematochezia 11/20/2015  . Pulmonary embolus ([place]) 11/15/2015  . Vertigo 11/15/2015  . Depression 11/15/2015    Current Outpatient Prescriptions:  .  acetaminophen-codeine (TYLENOL #3) 300-30 MG tablet, Take 1 tablet by mouth every 8 (eight) hours as needed for moderate pain. Max total Tyelnol < 2000mg  /day, Disp: 50 tablet, Rfl: 2 .  albuterol (PROVENTIL HFA;VENTOLIN HFA) 108 (90 Base) MCG/ACT inhaler, Inhale 1-2 puffs into the lungs every 6 (six) hours as needed for wheezing or shortness of breath., Disp: 6.7 g, Rfl: 5 .  atorvastatin (LIPITOR) 20 MG tablet, Take 1 tablet (20 mg total) by mouth daily at 6 PM., Disp: 90 tablet, Rfl: 2 .  docusate sodium (COLACE) 100 MG capsule, Take 1 capsule (100 mg total) by mouth every 12 (twelve) hours., Disp: 30 capsule, Rfl: 0 .  ferrous sulfate (FERROUSUL) 325 (65 FE) MG tablet, Take 1 tablet (325 mg total) by mouth daily with breakfast., Disp: 90 tablet, Rfl: 3 .  fluticasone (FLONASE) 50  MCG/ACT nasal spray, Place 2 sprays into both nostrils daily., Disp: 16 g, Rfl: 6 .  furosemide (LASIX) 40 MG tablet, Take 1 tablet (40 mg total) by mouth daily. Take extra 40 mg tablet once in the afternoon AS NEEDED for weight gain 3 lbs or more., Disp: 60 tablet, Rfl: 3 .  gabapentin (NEURONTIN) 300 MG capsule, Take 1 capsule (300 mg total) by mouth 3 (three) times daily., Disp: 90 capsule, Rfl: 1 .  loratadine (CLARITIN) 10 MG tablet, Take 1 tablet (10 mg total) by mouth daily., Disp: 30 tablet, Rfl: 11 .  meclizine (ANTIVERT) 25 MG tablet, TAKE ONE TABLET BY MOUTH THREE TIMES DAILY AS NEEDED FOR  DIZZINESS, Disp: 60 tablet, Rfl: 0 .  potassium chloride (K-DUR) 10 MEQ tablet, Take 1 tablet (10 mEq total) by mouth 2 (two) times daily., Disp: 60 tablet, Rfl: 1 .  Riociguat (ADEMPAS) 2.5 MG TABS, Take 2 mg by mouth 3 (three) times daily., Disp: 90 tablet, Rfl: 3 .  rivaroxaban (XARELTO) 20 MG TABS tablet, Take 1 tablet (20 mg total) by mouth daily with supper., Disp: 90 tablet, Rfl: 3 .  diclofenac (FLECTOR) 1.3 % PTCH, Place 1 patch onto the skin 2 (two) times daily. (Patient not taking: Reported on 04/09/2017), Disp: 60 patch, Rfl: 6 No Known Allergies   Social History   Social History  . Marital status: Married    Spouse name: N/A  . Number of children: N/A  . Years of education: N/A   Occupational History  . Not on file.   Social History Main  Topics  . Smoking status: Never Smoker  . Smokeless tobacco: Never Used  . Alcohol use No  . Drug use: No  . Sexual activity: Yes    Partners: Male    Birth control/ protection: Surgical   Other Topics Concern  . Not on file   Social History Narrative  . No narrative on file    Physical Exam  Constitutional: She is oriented to person, place, and time.  Cardiovascular: Regular rhythm.  Tachycardia present.   Pulmonary/Chest: Breath sounds normal. Tachypnea noted. She has no wheezes. She has no rales.  Musculoskeletal: Normal range of  motion. She exhibits edema.  Neurological: She is alert and oriented to person, place, and time.  Skin: Skin is warm and dry.        SAFE - 04/15/17 1600      Situation   Admitting diagnosis chf   Heart failure history Exisiting   Comorbidities HTN;Hx DVT/PE;Sleep Apnea   Readmitted within 30 days No   Hospital admission within past 12 months Yes   number of hospital admissions 1   number of ED visits 4     Assessment   Lives alone No   Primary support person Husband   Mode of transportation personal car;scat;family/friends   Other services involved Other   Home equipement Cane;Scale;Walker     Weight   Weighs self daily Yes   Scale provided No   Records on weight chart Yes     Resources   Has "Living better w/heart failure" book Yes   Has HF Zone tool Yes   Able to identify yellow zone signs/when to call MD Yes   Records zone daily No     Medications   Uses a pill box Yes   Who stocks the pill box Paramedicine   Pill box checked this visit Yes   Pill box refilled this visit Yes   Difficulty obtaining medications Yes   Barriers to medication Financial   Community Paramedic obtains medications from pharmacy No   Mail order medications Yes   New medications at home today No   Which ones [facility]   Next scheduled delivery of medications 05/09/17   Missed one or more doses of medications per week No     Nutrition   Patient receives meals on wheels No   Patient follows low sodium diet Yes   Has foods at home that meet the current recommended diet Yes   Patient follows low sugar/card diet No   Nutritional concerns/issues Affordability     Activity Level   ADL's/Mobility Independent   How many feet can patient ambulate 20 feet   Typical activity level Inactive     Urine   Difficulty urinating No   Changes in urine None     Time spent with patient   Time spent with patient  90 Minutes        Future Appointments Date Time Provider [place]   04/28/2017 3:40 PM GI-BCG MM 3 GI-BCGMM GI-BREAST CE  04/29/2017 1:30 PM MC-PULMONARY REHAB UNDERGRAD MC-REHSC None  05/01/2017 1:30 PM MC-PULMONARY REHAB UNDERGRAD MC-REHSC None  05/06/2017 1:30 PM MC-PULMONARY REHAB UNDERGRAD MC-REHSC None  05/08/2017 1:30 PM MC-PULMONARY REHAB UNDERGRAD MC-REHSC None  05/13/2017 1:30 PM MC-PULMONARY REHAB UNDERGRAD MC-REHSC None  05/15/2017 1:30 PM MC-PULMONARY REHAB UNDERGRAD MC-REHSC None  05/20/2017 1:30 PM MC-PULMONARY REHAB UNDERGRAD MC-REHSC None  05/22/2017 10:00 AM WL-MDCC ROOM WL-MDCC None  05/22/2017 1:30 PM MC-PULMONARY REHAB UNDERGRAD MC-REHSC None  05/27/2017 1:30 PM MC-PULMONARY  REHAB UNDERGRAD MC-REHSC None  05/28/2017 9:00 AM Bensimhon, Shaune Pascal, MD MC-HVSC None  05/29/2017 1:30 PM MC-PULMONARY REHAB UNDERGRAD MC-REHSC None  06/03/2017 1:30 PM MC-PULMONARY REHAB UNDERGRAD MC-REHSC None  06/05/2017 1:30 PM MC-PULMONARY REHAB UNDERGRAD MC-REHSC None  06/10/2017 1:30 PM MC-PULMONARY REHAB UNDERGRAD MC-REHSC None  06/12/2017 1:30 PM MC-PULMONARY REHAB UNDERGRAD MC-REHSC None  06/17/2017 1:30 PM MC-PULMONARY REHAB UNDERGRAD MC-REHSC None  06/19/2017 1:30 PM MC-PULMONARY REHAB UNDERGRAD MC-REHSC None  06/20/2017 9:00 AM WL-MDCC ROOM WL-MDCC None  06/24/2017 1:30 PM MC-PULMONARY REHAB UNDERGRAD MC-REHSC None  06/26/2017 1:30 PM MC-PULMONARY REHAB UNDERGRAD MC-REHSC None  07/23/2017 9:00 AM WL-MDCC ROOM WL-MDCC None  08/22/2017 9:00 AM WL-MDCC ROOM WL-MDCC None   BP 118/78 (BP Location: Right Arm, Patient Position: Sitting, Cuff Size: Normal)   Pulse (!) 110   Resp (!) 24   Wt (!) 327 lb 6.4 oz (148.5 kg)   SpO2 97%   BMI 56.20 kg/m  Weight yesterday- 324 lbs Last visit weight- 324 lbs  Ms Savarino was seen at home today and reports feeling SOB. She stated that she becomes increasingly SOB with minimal exertion and she is scared because that's how she felt last time her heart failure put her in the hospital. While weighing herself in the kitchen with me,  she became SOB and her HR increased to 110 bpm but her oxygen saturation remained 96% on RA. She asks that her appointment be moved up so her SOB can be addressed sooner. I spoke with Kennyth Lose today as well and we are working to procure the grab bars for the bathroom.   Time spent with patient: 35 minutes  [name], EMT 04/28/17  ACTION: Home visit completed Next visit planned for 1 week

## 2017-04-29 ENCOUNTER — Encounter (HOSPITAL_COMMUNITY): Payer: Medicare HMO

## 2017-05-01 ENCOUNTER — Encounter (HOSPITAL_COMMUNITY): Payer: Medicare HMO

## 2017-05-05 ENCOUNTER — Ambulatory Visit: Payer: Self-pay | Admitting: Internal Medicine

## 2017-05-06 ENCOUNTER — Encounter (HOSPITAL_COMMUNITY): Admission: RE | Admit: 2017-05-06 | Payer: Medicare HMO | Source: Ambulatory Visit

## 2017-05-07 NOTE — Progress Notes (Signed)
Pulmonary Individual Treatment Plan  Patient Details  Name: [name] [name] MRN: [id number] Date of Birth: 1961-01-24 Referring Provider:     Pulmonary Rehab Walk Test from 03/25/2017 in [place]  Referring Provider  Dr. Haroldine Laws      Initial Encounter Date:    Pulmonary Rehab Walk Test from 03/25/2017 in [place]  Date  03/25/17  Referring Provider  Dr. Haroldine Laws      Visit Diagnosis: No diagnosis found.  Patient's Home Medications on Admission:   Current Outpatient Prescriptions:  .  acetaminophen-codeine (TYLENOL #3) 300-30 MG tablet, Take 1 tablet by mouth every 8 (eight) hours as needed for moderate pain. Max total Tyelnol < '2000mg'$  /day, Disp: 50 tablet, Rfl: 2 .  albuterol (PROVENTIL HFA;VENTOLIN HFA) 108 (90 Base) MCG/ACT inhaler, Inhale 1-2 puffs into the lungs every 6 (six) hours as needed for wheezing or shortness of breath., Disp: 6.7 g, Rfl: 5 .  atorvastatin (LIPITOR) 20 MG tablet, Take 1 tablet (20 mg total) by mouth daily at 6 PM., Disp: 90 tablet, Rfl: 2 .  diclofenac (FLECTOR) 1.3 % PTCH, Place 1 patch onto the skin 2 (two) times daily. (Patient not taking: Reported on 04/09/2017), Disp: 60 patch, Rfl: 6 .  docusate sodium (COLACE) 100 MG capsule, Take 1 capsule (100 mg total) by mouth every 12 (twelve) hours., Disp: 30 capsule, Rfl: 0 .  ferrous sulfate (FERROUSUL) 325 (65 FE) MG tablet, Take 1 tablet (325 mg total) by mouth daily with breakfast., Disp: 90 tablet, Rfl: 3 .  fluticasone (FLONASE) 50 MCG/ACT nasal spray, Place 2 sprays into both nostrils daily., Disp: 16 g, Rfl: 6 .  furosemide (LASIX) 40 MG tablet, Take 1 tablet (40 mg total) by mouth daily. Take extra 40 mg tablet once in the afternoon AS NEEDED for weight gain 3 lbs or more., Disp: 60 tablet, Rfl: 3 .  gabapentin (NEURONTIN) 300 MG capsule, Take 1 capsule (300 mg total) by mouth 3 (three) times daily., Disp: 90 capsule, Rfl: 1 .   loratadine (CLARITIN) 10 MG tablet, Take 1 tablet (10 mg total) by mouth daily., Disp: 30 tablet, Rfl: 11 .  meclizine (ANTIVERT) 25 MG tablet, TAKE ONE TABLET BY MOUTH THREE TIMES DAILY AS NEEDED FOR  DIZZINESS, Disp: 60 tablet, Rfl: 0 .  potassium chloride (K-DUR) 10 MEQ tablet, Take 1 tablet (10 mEq total) by mouth 2 (two) times daily., Disp: 60 tablet, Rfl: 1 .  Riociguat (ADEMPAS) 2.5 MG TABS, Take 2 mg by mouth 3 (three) times daily., Disp: 90 tablet, Rfl: 3 .  rivaroxaban (XARELTO) 20 MG TABS tablet, Take 1 tablet (20 mg total) by mouth daily with supper., Disp: 90 tablet, Rfl: 3  Past Medical History: Past Medical History:  Diagnosis Date  . Arthritis   . Depression   . Diverticulitis   . Hyperlipidemia   . Hypertension   . Obesity   . Pneumonia   . Pulmonary embolism (HCC)     Tobacco Use: History  Smoking Status  . Never Smoker  Smokeless Tobacco  . Never Used    Labs: Recent Review Flowsheet Data    Labs for ITP Cardiac and Pulmonary Rehab Latest Ref Rng & Units 06/27/2016 10/28/2016 10/28/2016 10/28/2016   Cholestrol 0 - 200 mg/dL 166 - - -   LDLCALC 0 - 99 mg/dL 108(H) - - -   HDL >40 mg/dL 33(L) - - -   Trlycerides <150 mg/dL 125 - - -  Hemoglobin A1c 4.8 - 5.6 % 5.6 - - -   HCO3 20.0 - 28.0 mmol/L - 32.6(H) 27.9 28.4(H)   TCO2 0 - 100 mmol/L - 34 29 30   O2SAT % - 70.0 68.0 71.0      Capillary Blood Glucose: No results found for: GLUCAP   Pulmonary Assessment Scores:     Pulmonary Assessment Scores    Row Name 03/25/17 1658         ADL UCSD   ADL Phase Entry       mMRC Score   mMRC Score 2        Pulmonary Function Assessment:     Pulmonary Function Assessment - 03/03/17 1054      Breath   Bilateral Breath Sounds Clear   Shortness of Breath Yes;Limiting activity      Exercise Target Goals:    Exercise Program Goal: Individual exercise prescription set with THRR, safety & activity barriers. Participant demonstrates ability to  understand and report RPE using BORG scale, to self-measure pulse accurately, and to acknowledge the importance of the exercise prescription.  Exercise Prescription Goal: Starting with aerobic activity 30 plus minutes a day, 3 days per week for initial exercise prescription. Provide home exercise prescription and guidelines that participant acknowledges understanding prior to discharge.  Activity Barriers & Risk Stratification:   6 Minute Walk:     6 Minute Walk    Row Name 03/25/17 1653         6 Minute Walk   Phase Initial     Distance 1024 feet     Walk Time 6 minutes     # of Rest Breaks 0     MPH 1.9     METS 2.45     RPE 13     Perceived Dyspnea  1     Symptoms Yes (comment)     Comments left hip pain 7/10     Resting HR 103 bpm     Resting BP 104/70     Max Ex. HR 131 bpm     Max Ex. BP 120/80       Interval HR   Baseline HR (retired) 103     1 Minute HR 110     2 Minute HR 123     3 Minute HR 127     4 Minute HR 130     5 Minute HR 131     6 Minute HR 130     2 Minute Post HR 112     Interval Heart Rate? Yes       Interval Oxygen   Interval Oxygen? Yes     Baseline Oxygen Saturation % 93 %     Resting Liters of Oxygen 0 L     1 Minute Oxygen Saturation % 94 %     1 Minute Liters of Oxygen 0 L     2 Minute Oxygen Saturation % 89 %     2 Minute Liters of Oxygen 0 L     3 Minute Oxygen Saturation % 93 %     3 Minute Liters of Oxygen 0 L     4 Minute Oxygen Saturation % 89 %     4 Minute Liters of Oxygen 0 L     5 Minute Oxygen Saturation % 93 %     5 Minute Liters of Oxygen 0 L     6 Minute Oxygen Saturation % 93 %     6 Minute Liters  of Oxygen 0 L     2 Minute Post Oxygen Saturation % 97 %     2 Minute Post Liters of Oxygen 0 L        Oxygen Initial Assessment:     Oxygen Initial Assessment - 03/25/17 1657      Initial 6 min Walk   Oxygen Used None   Resting Oxygen Saturation  93 %   Exercise Oxygen Saturation  during 6 min walk 89 %      Program Oxygen Prescription   Program Oxygen Prescription None      Oxygen Re-Evaluation:     Oxygen Re-Evaluation    Row Name 04/08/17 1947 05/07/17 1850           Program Oxygen Prescription   Program Oxygen Prescription None None        Home Oxygen   Home Oxygen Device None  -      Sleep Oxygen Prescription None  -      Home Exercise Oxygen Prescription None  -      Home at Rest Exercise Oxygen Prescription None  -         Oxygen Discharge (Final Oxygen Re-Evaluation):     Oxygen Re-Evaluation - 05/07/17 1850      Program Oxygen Prescription   Program Oxygen Prescription None      Initial Exercise Prescription:     Initial Exercise Prescription - 03/25/17 1600      Date of Initial Exercise RX and Referring Provider   Date 03/25/17   Referring Provider Dr. Haroldine Laws     NuStep   Level 2   Minutes 17   METs 1.5     Track   Laps 15   Minutes 30     Prescription Details   Frequency (times per week) 2   Duration Progress to 45 minutes of aerobic exercise without signs/symptoms of physical distress     Intensity   THRR 40-80% of Max Heartrate 66-133   Ratings of Perceived Exertion 11-13   Perceived Dyspnea 0-4     Progression   Progression Continue progressive overload as per policy without signs/symptoms or physical distress.     Resistance Training   Training Prescription Yes   Weight orange bands  Pulm HTN   Reps 10-15      Perform Capillary Blood Glucose checks as needed.  Exercise Prescription Changes:     Exercise Prescription Changes    Row Name 03/27/17 1552 04/01/17 1500           Response to Exercise   Blood Pressure (Admit) 100/54 94/54  Pt given gator-aide before exercise      Blood Pressure (Exercise) 100/60 94/50      Blood Pressure (Exit) 100/60 94/50      Heart Rate (Admit) 76 bpm 74 bpm      Heart Rate (Exercise) 96 bpm 109 bpm      Heart Rate (Exit) 92 bpm 68 bpm      Oxygen Saturation (Admit) 95 % 92 %       Oxygen Saturation (Exercise) 96 % 92 %      Oxygen Saturation (Exit) 97 % 98 %      Rating of Perceived Exertion (Exercise) 12 13      Perceived Dyspnea (Exercise) 1 2      Duration Progress to 45 minutes of aerobic exercise without signs/symptoms of physical distress Progress to 45 minutes of aerobic exercise without signs/symptoms of physical distress  Intensity Other (comment)  40-80% HRR THRR unchanged        Progression   Progression  - Continue to progress workloads to maintain intensity without signs/symptoms of physical distress.        Resistance Training   Training Prescription Yes Yes      Weight orange bands  Pulm HTN orange bands      Reps 10-15 10-15      Time  - 10 Minutes        NuStep   Level  - 2      Minutes  - 17      METs  - 2        Track   Laps 12 11      Minutes 30 34         Exercise Comments:   Exercise Goals and Review:   Exercise Goals Re-Evaluation :     Exercise Goals Re-Evaluation    Row Name 04/04/17 1345 05/06/17 1600           Exercise Goal Re-Evaluation   Exercise Goals Review Increase Strenth and Stamina;Increase Physical Activity Increase Physical Activity;Increase Strength and Stamina;Able to understand and use Dyspnea scale;Able to understand and use rate of perceived exertion (RPE) scale;Knowledge and understanding of Target Heart Rate Range (THRR);Understanding of Exercise Prescription      Comments Patient has only attended two exercise sessions. Will cont. to monitor and progress as able.  Patient has only attended two exercise sessions. Will cont. to monitor and progress as able.       Expected Outcomes Through exercise at home and at rehab, patient will increase physical activity, strength, and stamina.  Through exercise at home and at rehab, patient will increase physical activity, strength, and stamina.          Discharge Exercise Prescription (Final Exercise Prescription Changes):     Exercise Prescription  Changes - 04/01/17 1500      Response to Exercise   Blood Pressure (Admit) 94/54  Pt given gator-aide before exercise   Blood Pressure (Exercise) 94/50   Blood Pressure (Exit) 94/50   Heart Rate (Admit) 74 bpm   Heart Rate (Exercise) 109 bpm   Heart Rate (Exit) 68 bpm   Oxygen Saturation (Admit) 92 %   Oxygen Saturation (Exercise) 92 %   Oxygen Saturation (Exit) 98 %   Rating of Perceived Exertion (Exercise) 13   Perceived Dyspnea (Exercise) 2   Duration Progress to 45 minutes of aerobic exercise without signs/symptoms of physical distress   Intensity THRR unchanged     Progression   Progression Continue to progress workloads to maintain intensity without signs/symptoms of physical distress.     Resistance Training   Training Prescription Yes   Weight orange bands   Reps 10-15   Time 10 Minutes     NuStep   Level 2   Minutes 17   METs 2     Track   Laps 11   Minutes 34      Nutrition:  Target Goals: Understanding of nutrition guidelines, daily intake of sodium '1500mg'$ , cholesterol '200mg'$ , calories 30% from fat and 7% or less from saturated fats, daily to have 5 or more servings of fruits and vegetables.  Biometrics:     Pre Biometrics - 03/03/17 1059      Pre Biometrics   Grip Strength 24 kg       Nutrition Therapy Plan and Nutrition Goals:     Nutrition Therapy & Goals -  03/27/17 0755      Nutrition Therapy   Diet Therapeutic Lifestyle Changes     Personal Nutrition Goals   Nutrition Goal Wt loss of 1-2 lb/week to a wt loss goal of 6-24 lb at graduation from Pulmonary Rehab.      Intervention Plan   Intervention Prescribe, educate and counsel regarding individualized specific dietary modifications aiming towards targeted core components such as weight, hypertension, lipid management, diabetes, heart failure and other comorbidities.   Expected Outcomes Short Term Goal: Understand basic principles of dietary content, such as calories, fat, sodium,  cholesterol and nutrients.;Long Term Goal: Adherence to prescribed nutrition plan.      Nutrition Discharge: Rate Your Plate Scores:     Nutrition Assessments - 03/27/17 0750      Rate Your Plate Scores   Pre Score 49      Nutrition Goals Re-Evaluation:   Nutrition Goals Discharge (Final Nutrition Goals Re-Evaluation):   Psychosocial: Target Goals: Acknowledge presence or absence of significant depression and/or stress, maximize coping skills, provide positive support system. Participant is able to verbalize types and ability to use techniques and skills needed for reducing stress and depression.  Initial Review & Psychosocial Screening:     Initial Psych Review & Screening - 03/03/17 1127      Initial Review   Current issues with History of Depression  chooses to not take antidepressants and go to counselling     [place]? Yes     Barriers   Psychosocial barriers to participate in program There are no identifiable barriers or psychosocial needs.     Screening Interventions   Interventions Encouraged to exercise      Quality of Life Scores:   PHQ-9: Recent Review Flowsheet Data    Depression screen [facility] 2/9 04/10/2017 03/03/2017 03/03/2017 01/27/2017 12/11/2016   Decreased Interest '3 3 3 1 2   '$ Down, Depressed, Hopeless '3 3 2 1 2   '$ PHQ - 2 Score '6 6 5 2 4   '$ Altered sleeping '2 3 3 3 2   '$ Tired, decreased energy '2 2 1 3 3   '$ Change in appetite 2 1 0 0 2   Feeling bad or failure about yourself  '1 2 1 1 2   '$ Trouble concentrating 1 - '1 1 2   '$ Moving slowly or fidgety/restless 0 0 0 0 1   Suicidal thoughts 0 0 0 0 0   PHQ-9 Score '14 14 11 10 16   '$ Difficult doing work/chores - - Not difficult at all - -     Interpretation of Total Score  Total Score Depression Severity:  1-4 = Minimal depression, 5-9 = Mild depression, 10-14 = Moderate depression, 15-19 = Moderately severe depression, 20-27 = Severe depression   Psychosocial Evaluation and  Intervention:     Psychosocial Evaluation - 04/08/17 1949      Psychosocial Evaluation & Interventions   Interventions Encouraged to exercise with the program and follow exercise prescription   Continue Psychosocial Services  Follow up required by staff      Psychosocial Re-Evaluation:   Psychosocial Discharge (Final Psychosocial Re-Evaluation):   Education: Education Goals: Education classes will be provided on a weekly basis, covering required topics. Participant will state understanding/return demonstration of topics presented.  Learning Barriers/Preferences:     Learning Barriers/Preferences - 03/03/17 1053      Learning Barriers/Preferences   Learning Barriers None   Learning Preferences Audio;Computer/Internet;Pictoral;Written Material      Education Topics:  Risk Factor Reduction:  -Group instruction that is supported by a PowerPoint presentation. Instructor discusses the definition of a risk factor, different risk factors for pulmonary disease, and how the heart and lungs work together.     Nutrition for Pulmonary Patient:  -Group instruction provided by PowerPoint slides, verbal discussion, and written materials to support subject matter. The instructor gives an explanation and review of healthy diet recommendations, which includes a discussion on weight management, recommendations for fruit and vegetable consumption, as well as protein, fluid, caffeine, fiber, sodium, sugar, and alcohol. Tips for eating when patients are short of breath are discussed.   Pursed Lip Breathing:  -Group instruction that is supported by demonstration and informational handouts. Instructor discusses the benefits of pursed lip and diaphragmatic breathing and detailed demonstration on how to preform both.     Oxygen Safety:  -Group instruction provided by PowerPoint, verbal discussion, and written material to support subject matter. There is an overview of "What is Oxygen" and "Why do we  need it".  Instructor also reviews how to create a safe environment for oxygen use, the importance of using oxygen as prescribed, and the risks of noncompliance. There is a brief discussion on traveling with oxygen and resources the patient may utilize.   Oxygen Equipment:  -Group instruction provided by [facility] Staff utilizing handouts, written materials, and equipment demonstrations.   Signs and Symptoms:  -Group instruction provided by written material and verbal discussion to support subject matter. Warning signs and symptoms of infection, stroke, and heart attack are reviewed and when to call the physician/911 reinforced. Tips for preventing the spread of infection discussed.   Advanced Directives:  -Group instruction provided by verbal instruction and written material to support subject matter. Instructor reviews Advanced Directive laws and proper instruction for filling out document.   Pulmonary Video:  -Group video education that reviews the importance of medication and oxygen compliance, exercise, good nutrition, pulmonary hygiene, and pursed lip and diaphragmatic breathing for the pulmonary patient.   Exercise for the Pulmonary Patient:  -Group instruction that is supported by a PowerPoint presentation. Instructor discusses benefits of exercise, core components of exercise, frequency, duration, and intensity of an exercise routine, importance of utilizing pulse oximetry during exercise, safety while exercising, and options of places to exercise outside of rehab.     Pulmonary Medications:  -Verbally interactive group education provided by instructor with focus on inhaled medications and proper administration.   Anatomy and Physiology of the Respiratory System and Intimacy:  -Group instruction provided by PowerPoint, verbal discussion, and written material to support subject matter. Instructor reviews respiratory cycle and anatomical components of the respiratory system and  their functions. Instructor also reviews differences in obstructive and restrictive respiratory diseases with examples of each. Intimacy, Sex, and Sexuality differences are reviewed with a discussion on how relationships can change when diagnosed with pulmonary disease. Common sexual concerns are reviewed.   PULMONARY REHAB OTHER RESPIRATORY from 03/27/2017 in [place]  Date  03/27/17  Educator  RN  Instruction Review Code  2- meets goals/outcomes      MD DAY -A group question and answer session with a medical doctor that allows participants to ask questions that relate to their pulmonary disease state.   OTHER EDUCATION -Group or individual verbal, written, or video instructions that support the educational goals of the pulmonary rehab program.   Knowledge Questionnaire Score:   Core Components/Risk Factors/Patient Goals at Admission:     Personal Goals and  Risk Factors at Admission - 03/03/17 1059      Core Components/Risk Factors/Patient Goals on Admission    Weight Management Weight Loss;Yes   Intervention Weight Management: Develop a combined nutrition and exercise program designed to reach desired caloric intake, while maintaining appropriate intake of nutrient and fiber, sodium and fats, and appropriate energy expenditure required for the weight goal.;Weight Management: Provide education and appropriate resources to help participant work on and attain dietary goals.;Weight Management/Obesity: Establish reasonable short term and long term weight goals.;Obesity: Provide education and appropriate resources to help participant work on and attain dietary goals.   Admit Weight 324 lb 11.8 oz (147.3 kg)   Goal Weight: Short Term 320 lb (145.2 kg)   Expected Outcomes Short Term: Continue to assess and modify interventions until short term weight is achieved   Improve shortness of breath with ADL's Yes   Intervention Provide education, individualized  exercise plan and daily activity instruction to help decrease symptoms of SOB with activities of daily living.   Expected Outcomes Short Term: Achieves a reduction of symptoms when performing activities of daily living.   Develop more efficient breathing techniques such as purse lipped breathing and diaphragmatic breathing; and practicing self-pacing with activity Yes   Intervention Provide education, demonstration and support about specific breathing techniuqes utilized for more efficient breathing. Include techniques such as pursed lipped breathing, diaphragmatic breathing and self-pacing activity.   Expected Outcomes Short Term: Participant will be able to demonstrate and use breathing techniques as needed throughout daily activities.      Core Components/Risk Factors/Patient Goals Review:      Goals and Risk Factor Review    Row Name 04/08/17 1948             Core Components/Risk Factors/Patient Goals Review   Personal Goals Review Weight Management/Obesity;Develop more efficient breathing techniques such as purse lipped breathing and diaphragmatic breathing and practicing self-pacing with activity.;Improve shortness of breath with ADL's       Review patient has only attended 2 sessions since admission. too soon to evaluate progress towards goals       Expected Outcomes see admission expected outcomes          Core Components/Risk Factors/Patient Goals at Discharge (Final Review):      Goals and Risk Factor Review - 04/08/17 1948      Core Components/Risk Factors/Patient Goals Review   Personal Goals Review Weight Management/Obesity;Develop more efficient breathing techniques such as purse lipped breathing and diaphragmatic breathing and practicing self-pacing with activity.;Improve shortness of breath with ADL's   Review patient has only attended 2 sessions since admission. too soon to evaluate progress towards goals   Expected Outcomes see admission expected outcomes       ITP Comments:   Comments: Patient has only attended 2 sessions since admission. She is currently on medical hold for dizziness and general malaise. Due for telephone follow up this week. If patient remains unable to attend will discharge until patient feels she is medically able to participate in a moderate exercise program like pulmonary rehab.

## 2017-05-08 ENCOUNTER — Encounter (HOSPITAL_COMMUNITY)
Admission: RE | Admit: 2017-05-08 | Discharge: 2017-05-08 | Disposition: A | Discharge status: Home / Self Care | Payer: Medicare HMO | Source: Ambulatory Visit | Attending: Internal Medicine | Admitting: Internal Medicine

## 2017-05-08 ENCOUNTER — Encounter: Payer: Self-pay | Admitting: Acute Care

## 2017-05-08 ENCOUNTER — Ambulatory Visit (INDEPENDENT_AMBULATORY_CARE_PROVIDER_SITE_OTHER): Payer: Medicare HMO | Admitting: Acute Care

## 2017-05-08 VITALS — BP 100/60 | HR 95 | Ht 63.0 in | Wt 325.0 lb

## 2017-05-08 DIAGNOSIS — J309 Allergic rhinitis, unspecified: Secondary | ICD-10-CM

## 2017-05-08 DIAGNOSIS — R053 Chronic cough: Secondary | ICD-10-CM

## 2017-05-08 DIAGNOSIS — R05 Cough: Secondary | ICD-10-CM

## 2017-05-08 DIAGNOSIS — G4733 Obstructive sleep apnea (adult) (pediatric): Secondary | ICD-10-CM

## 2017-05-08 DIAGNOSIS — Z9989 Dependence on other enabling machines and devices: Secondary | ICD-10-CM | POA: Diagnosis not present

## 2017-05-08 DIAGNOSIS — R059 Cough, unspecified: Secondary | ICD-10-CM

## 2017-05-08 LAB — NITRIC OXIDE: Nitric Oxide: 11

## 2017-05-08 MED ORDER — BENZONATATE 100 MG PO CAPS: 100.0000 mg | ORAL_CAPSULE | Freq: Three times a day (TID) | ORAL | 0 refills | Status: DC | PRN

## 2017-05-08 MED ORDER — PREDNISONE 10 MG PO TABS: ORAL_TABLET | ORAL | 0 refills | Status: DC

## 2017-05-08 MED ORDER — OMEPRAZOLE 20 MG PO CPDR: 20.0000 mg | DELAYED_RELEASE_CAPSULE | Freq: Two times a day (BID) | ORAL | 0 refills | Status: DC

## 2017-05-08 MED FILL — predniSONE 10 MG TABS: 10 | 8 days supply | Qty: 20 | Fill #0

## 2017-05-08 MED FILL — BENZONATATE 100 MG CAPSULE: 100 | 10 days supply | Qty: 30 | Fill #0

## 2017-05-08 MED FILL — OMEPRAZOLE DR 20 MG CAPSULE: 20 | 30 days supply | Qty: 60 | Fill #0

## 2017-05-08 NOTE — Assessment & Plan Note (Signed)
Pt. States she is compliant with CPAP Plan: Continue on CPAP at bedtime. Goal is to wear for at least 6 hours each night for maximal clinical benefit. Continue to work on weight loss, as the link between excess weight  and sleep apnea is well established.  Do not drive if sleepy. Remember to clean mask, tubing, filter, and reservoir once weekly with soapy water.   Follow up with Dr. Vaughan Browner or Dr. Melvyn Novas  In 3 weeks, after CT sinus and labs have resulted tore- evaluate  Please contact office for sooner follow up if symptoms do not improve or worsen or seek emergency care

## 2017-05-08 NOTE — Patient Instructions (Addendum)
It is nice to meet you today. We will do a FENO today CT Sinus, we will schedule today Allergy profile ( LAB) Draw 05/18/2017 via porta cath [facility]) Omeprazole 20 mg twice daily. Take 30 minutes before breakfast and 30 minutes before dinner. Prednisone taper; 10 mg tablets: 4 tabs x 2 days, 3 tabs x 2 days, 2 tabs x 2 days 1 tab x 2 days then stop. Tessalon Perles 100 mg capsule up to 3 times a day. Do not drive if sleepy. Sugar free Jolly Ranchers or lemon heads for throat soothing Sips of water instead of throat clearing Avoid mint, menthol, spearmint, chocolate, oil based vitamins. Delsym cough syrup one teaspoon every 12 hours. We will give you a copy of the GERD diet   Continue on CPAP at bedtime. Goal is to wear for at least 6 hours each night for maximal clinical benefit. Continue to work on weight loss, as the link between excess weight  and sleep apnea is well established.  Do not drive if sleepy. Remember to clean mask, tubing, filter, and reservoir once weekly with soapy water.   Follow up with Dr. Vaughan Browner or Dr. Melvyn Novas  In 3 weeks, after CT sinus and labs have resulted tore- evaluate  Please contact office for sooner follow up if symptoms do not improve or worsen or seek emergency care

## 2017-05-08 NOTE — Progress Notes (Signed)
Chart and office note reviewed in detail  > agree with a/p as outlined    

## 2017-05-08 NOTE — Progress Notes (Signed)
History of Present Illness [name] [name] is a 56 y.o. female never smoker  with history of PE  ( On xaralto) and PAH. She is followed by Dr. Vaughan [name].  Synopsis: [name] is a 56 year old with past medical history of morbid obesity, obstructive sleep apnea, hypertension. She was admitted in January 2017 at [facility] for saddle embolus. She was initially on heparin anticoagulation and transition to Xarelto. Since discharge has been complaining of dyspnea, cough with white sputum production, episodes of dizziness, right chest pain. She was reevaluated with a CT scan which showed persistent emboli and an echocardiogram which showed severe pulmonary hypertension (see below). She had a PTE done at [facility] 7/17. She remains on Xaralto for remaining clot burden.  She has history of obstructive sleep apnea diagnosed in 2015. She had been maintained on CPAP. Compliance has been an issue.   05/08/2017 Consult for chronic cough: Pt. Present for cough that she has had for 6 months. She states there was no viral trigger that she is aware of. She states she coughs so hard sometimes she vomits.She states she does experience PND and has taken both claritin and Flonase for this. She states the cough is productive for white thick secretions, which occasionally has a yellow tinge. She states the cough is worse at night. She has tried Omeprazole at night in the past, and she states she didn't think it worked well. She has never had a prednisone taper for cough. She is not following a reflux diet. She is not using anything for cough supression. She states her voice has been hoarse with cough.She is not on an ACEI. She uses albuterol as needed, about once daily.She denies fever, chest pain, orthopnea of hemoptysis.Recent CXR ( below)was negative for acute cardiopulmonary processes.  Test Results:  DATA: CXR 03/20/2017>> IMPRESSION: Scarring right base. No edema or consolidation. Stable  cardiac silhouette. Stable Port-A-Cath position. No evident adenopathy.  CT scan 12/25/15 Right lower lobe and left lower lobe pulmonary embolism. Images reviewed  Echo 12/15/15  LVEF 55-60 percent. Normal cavity, wall thickness, systolic function. RV cavity size normal. Normal wall thickness and systolic function [id number]  PFT's:02/2016 Pulmonary Function Diagnosis: Minimal Obstructive Airways Disease with reversibility Moderate Diffusion Defect, + BD response.   FENO: 11  CBC Latest Ref Rng & Units 03/20/2017 12/19/2016 10/24/2016  WBC 4.0 - 10.5 K/uL 6.2 6.0 6.6  Hemoglobin 12.0 - 15.0 g/dL 11.1(L) 10.8(L) 11.5(L)  Hematocrit 36.0 - 46.0 % 34.3(L) 33.9(L) 35.8(L)  Platelets 150 - 400 K/uL 333 326 257    BMP Latest Ref Rng & Units 04/09/2017 12/19/2016 10/24/2016  Glucose 65 - 99 mg/dL 96 104(H) 73  BUN 6 - 20 mg/dL 14 13 11   Creatinine 0.44 - 1.00 mg/dL 1.03(H) 1.04(H) 1.04(H)  Sodium 135 - 145 mmol/L 140 141 142  Potassium 3.5 - 5.1 mmol/L 4.2 3.5 4.1  Chloride 101 - 111 mmol/L 105 105 108  CO2 22 - 32 mmol/L 29 30 25   Calcium 8.9 - 10.3 mg/dL 9.3 9.1 9.4    BNP    Component Value Date/Time   BNP 88.3 03/20/2016 1247    ProBNP No results found for: PROBNP  PFT    Component Value Date/Time   FEV1PRE 1.92 02/15/2016 1358   FEV1POST 2.19 02/15/2016 1358   FVCPRE 2.44 02/15/2016 1358   FVCPOST 2.66 02/15/2016 1358   TLC 4.68 02/15/2016 1358   DLCOUNC 14.70 02/15/2016 1358   PREFEV1FVCRT 79 02/15/2016 1358   PSTFEV1FVCRT 82 02/15/2016  [place] Bilateral  Result Date: 04/29/2017 CLINICAL DATA:  Screening. EXAM: DIGITAL SCREENING BILATERAL MAMMOGRAM WITH CAD COMPARISON:  Previous exam(s). ACR Breast Density Category a: The breast tissue is almost entirely fatty. FINDINGS: There are no findings suspicious for malignancy. Images were processed with CAD. IMPRESSION: No mammographic evidence of malignancy. A result letter of this screening mammogram will  be mailed directly to the patient. RECOMMENDATION: Screening mammogram in one year. (Code:SM-B-01Y) BI-RADS CATEGORY  1: Negative. Electronically Signed   By: Franki Cabot M.D.   On: 04/29/2017 10:19     Past medical hx Past Medical History:  Diagnosis Date  . Arthritis   . Depression   . Diverticulitis   . Hyperlipidemia   . Hypertension   . Obesity   . Pneumonia   . Pulmonary embolism [facility])      Social History  Substance Use Topics  . Smoking status: Never Smoker  . Smokeless tobacco: Never Used  . Alcohol use No    [name] [name] reports that she has never smoked. She has never used smokeless tobacco. She reports that she does not drink alcohol or use drugs.  Tobacco Cessation: Never smoker  Past surgical hx, Family hx, Social hx all reviewed.  Current Outpatient Prescriptions on File Prior to Visit  Medication Sig  . acetaminophen-codeine (TYLENOL #3) 300-30 MG tablet Take 1 tablet by mouth every 8 (eight) hours as needed for moderate pain. Max total Tyelnol < 2000mg  /day  . albuterol (PROVENTIL HFA;VENTOLIN HFA) 108 (90 Base) MCG/ACT inhaler Inhale 1-2 puffs into the lungs every 6 (six) hours as needed for wheezing or shortness of breath.  Marland Kitchen atorvastatin (LIPITOR) 20 MG tablet Take 1 tablet (20 mg total) by mouth daily at 6 PM.  . diclofenac (FLECTOR) 1.3 % PTCH Place 1 patch onto the skin 2 (two) times daily.  Marland Kitchen docusate sodium (COLACE) 100 MG capsule Take 1 capsule (100 mg total) by mouth every 12 (twelve) hours.  . ferrous sulfate (FERROUSUL) 325 (65 FE) MG tablet Take 1 tablet (325 mg total) by mouth daily with breakfast.  . fluticasone (FLONASE) 50 MCG/ACT nasal spray Place 2 sprays into both nostrils daily.  . furosemide (LASIX) 40 MG tablet Take 1 tablet (40 mg total) by mouth daily. Take extra 40 mg tablet once in the afternoon AS NEEDED for weight gain 3 lbs or more.  . loratadine (CLARITIN) 10 MG tablet Take 1 tablet (10 mg total) by mouth daily.  . meclizine  (ANTIVERT) 25 MG tablet TAKE ONE TABLET BY MOUTH THREE TIMES DAILY AS NEEDED FOR  DIZZINESS  . potassium chloride (K-DUR) 10 MEQ tablet Take 1 tablet (10 mEq total) by mouth 2 (two) times daily.  . Riociguat (ADEMPAS) 2.5 MG TABS Take 2 mg by mouth 3 (three) times daily.  . rivaroxaban (XARELTO) 20 MG TABS tablet Take 1 tablet (20 mg total) by mouth daily with supper.  . gabapentin (NEURONTIN) 300 MG capsule Take 1 capsule (300 mg total) by mouth 3 (three) times daily.   No current facility-administered medications on file prior to visit.      No Known Allergies  Review Of Systems:  Constitutional:   No  weight loss, night sweats,  Fevers, chills, fatigue, or  lassitude.  HEENT:   No headaches,  Difficulty swallowing,  Tooth/dental problems, or  Sore throat,                No sneezing, itching, ear ache, nasal congestion, +post  nasal drip,   CV:  No chest pain,  Orthopnea, PND, swelling in lower extremities, anasarca, dizziness, palpitations, syncope.   GI  No heartburn, indigestion, abdominal pain, nausea, vomiting, diarrhea, change in bowel habits, loss of appetite, bloody stools.   Resp: No shortness of breath with exertion or at rest.  No excess mucus, + productive cough,  + non-productive cough,  No coughing up of blood.  No change in color of mucus.  No wheezing.  No chest wall deformity  Skin: no rash or lesions.  GU: no dysuria, change in color of urine, no urgency or frequency.  No flank pain, no hematuria   MS:  No joint pain or swelling.  No decreased range of motion.  No back pain.  Psych:  No change in mood or affect. No depression or anxiety.  No memory loss.   Vital Signs BP 100/60 (BP Location: Left Wrist, Cuff Size: Normal)   Pulse 95   Ht 5\' 3"  (1.6 m)   Wt (!) 325 lb (147.4 kg)   SpO2 95%   BMI 57.57 kg/m    Physical Exam:  General- No distress,  A&Ox3, pleasant ENT: No sinus tenderness, TM clear, pale nasal mucosa, no oral exudate,+ post nasal drip,  no LAN Cardiac: S1, S2, regular rate and rhythm, no murmur Chest: No wheeze/ rales/ dullness; no accessory muscle use, no nasal flaring, no sternal retractions, coarse throughout Abd.: Soft Non-tender, obese Ext: No clubbing cyanosis, edema Neuro:  normal strength, deconditioned at baseline Skin: No rashes, warm and dry Psych: normal mood and behavior   Assessment/Plan  Chronic cough Chronic cough x 6 months Plan: We will do a FENO today CT Sinus, we will schedule today Allergy profile ( LAB) Draw 05/18/2017 via porta cath [facility]) Omeprazole 20 mg twice daily. Take 30 minutes before breakfast and 30 minutes before dinner. Prednisone taper; 10 mg tablets: 4 tabs x 2 days, 3 tabs x 2 days, 2 tabs x 2 days 1 tab x 2 days then stop. Tessalon Perles 100 mg capsule up to 3 times a day. Do not drive if sleepy. Sugar free Jolly Ranchers or lemon heads for throat soothing Sips of water instead of throat clearing Avoid mint, menthol, spearmint, chocolate, oil based vitamins. Delsym cough syrup one teaspoon every 12 hours. We will give you a copy of the GERD diet Follow up with Dr. Sim Boast in 3 weeks Please contact office for sooner follow up if symptoms do not improve or worsen or seek emergency care    OSA on CPAP Pt. States she is compliant with CPAP Plan: Continue on CPAP at bedtime. Goal is to wear for at least 6 hours each night for maximal clinical benefit. Continue to work on weight loss, as the link between excess weight  and sleep apnea is well established.  Do not drive if sleepy. Remember to clean mask, tubing, filter, and reservoir once weekly with soapy water.   Follow up with Dr. Vaughan [name] or Dr. Melvyn Novas  In 3 weeks, after CT sinus and labs have resulted tore- evaluate  Please contact office for sooner follow up if symptoms do not improve or worsen or seek emergency care    Chronic PE: Continue /xaralto as you have been doing Bleeding precautions  discussed  [place], NP 05/08/2017  10:44 AM

## 2017-05-08 NOTE — Assessment & Plan Note (Signed)
Chronic cough x 6 months Plan: We will do a FENO today CT Sinus, we will schedule today Allergy profile ( LAB) Draw 05/18/2017 via porta cath [facility]) Omeprazole 20 mg twice daily. Take 30 minutes before breakfast and 30 minutes before dinner. Prednisone taper; 10 mg tablets: 4 tabs x 2 days, 3 tabs x 2 days, 2 tabs x 2 days 1 tab x 2 days then stop. Tessalon Perles 100 mg capsule up to 3 times a day. Do not drive if sleepy. Sugar free Jolly Ranchers or lemon heads for throat soothing Sips of water instead of throat clearing Avoid mint, menthol, spearmint, chocolate, oil based vitamins. Delsym cough syrup one teaspoon every 12 hours. We will give you a copy of the GERD diet Follow up with [name] [name] in 3 weeks Please contact office for sooner follow up if symptoms do not improve or worsen or seek emergency care

## 2017-05-13 ENCOUNTER — Other Ambulatory Visit (HOSPITAL_COMMUNITY): Payer: Self-pay

## 2017-05-13 ENCOUNTER — Telehealth: Payer: Self-pay | Admitting: Pulmonary Disease

## 2017-05-13 ENCOUNTER — Encounter (HOSPITAL_COMMUNITY): Admission: RE | Admit: 2017-05-13 | Payer: Medicare HMO | Source: Ambulatory Visit

## 2017-05-13 MED ORDER — PREDNISONE 10 MG PO TABS: ORAL_TABLET | ORAL | 0 refills | Status: DC

## 2017-05-13 MED ORDER — BENZONATATE 100 MG PO CAPS: 100.0000 mg | ORAL_CAPSULE | Freq: Three times a day (TID) | ORAL | 0 refills | Status: DC | PRN

## 2017-05-13 MED ORDER — OMEPRAZOLE 20 MG PO CPDR: 20.0000 mg | DELAYED_RELEASE_CAPSULE | Freq: Two times a day (BID) | ORAL | 0 refills | Status: DC

## 2017-05-13 NOTE — Progress Notes (Signed)
Paramedicine Encounter    Patient ID: [name] [name], female    DOB: [birth year], 56 y.o.   MRN: [id number]   Patient Care Team: Ladell Pier, MD as PCP - General (Internal Medicine) Loletta Specter, RN as [place] Management  Patient Active Problem List   Diagnosis Date Noted  . Chronic cough 04/10/2017  . Rheumatoid arthritis involving multiple sites ([place]) 04/10/2017  . Anxiety and depression 04/10/2017  . Adjustment disorder with depressed mood 12/11/2016  . Acute upper respiratory infection 12/11/2016  . GAD (generalized anxiety disorder) 08/14/2016  . Morbid obesity ([place]) 08/14/2016  . Stroke ([place]) 06/27/2016  . Right sided weakness 06/27/2016  . Essential hypertension 06/27/2016  . Atypical chest pain 06/27/2016  . Difficult airway 03/29/2016  . CTEPH (chronic thromboembolic pulmonary hypertension) ([place]) 03/14/2016  . OSA on CPAP 02/20/2016  . Chronic pain syndrome 01/31/2016  . Vaginal bleeding 11/20/2015  . Hematochezia 11/20/2015  . Pulmonary embolus ([place]) 11/15/2015  . Vertigo 11/15/2015  . Depression 11/15/2015    Current Outpatient Prescriptions:  .  acetaminophen-codeine (TYLENOL #3) 300-30 MG tablet, Take 1 tablet by mouth every 8 (eight) hours as needed for moderate pain. Max total Tyelnol < 2000mg  /day, Disp: 50 tablet, Rfl: 2 .  albuterol (PROVENTIL HFA;VENTOLIN HFA) 108 (90 Base) MCG/ACT inhaler, Inhale 1-2 puffs into the lungs every 6 (six) hours as needed for wheezing or shortness of breath., Disp: 6.7 g, Rfl: 5 .  atorvastatin (LIPITOR) 20 MG tablet, Take 1 tablet (20 mg total) by mouth daily at 6 PM., Disp: 90 tablet, Rfl: 2 .  benzonatate (TESSALON PERLES) 100 MG capsule, Take 1 capsule (100 mg total) by mouth 3 (three) times daily as needed for cough., Disp: 30 capsule, Rfl: 0 .  docusate sodium (COLACE) 100 MG capsule, Take 1 capsule (100 mg total) by mouth every 12 (twelve) hours., Disp: 30 capsule, Rfl: 0 .   fluticasone (FLONASE) 50 MCG/ACT nasal spray, Place 2 sprays into both nostrils daily., Disp: 16 g, Rfl: 6 .  furosemide (LASIX) 40 MG tablet, Take 1 tablet (40 mg total) by mouth daily. Take extra 40 mg tablet once in the afternoon AS NEEDED for weight gain 3 lbs or more., Disp: 60 tablet, Rfl: 3 .  gabapentin (NEURONTIN) 300 MG capsule, Take 1 capsule (300 mg total) by mouth 3 (three) times daily., Disp: 90 capsule, Rfl: 1 .  loratadine (CLARITIN) 10 MG tablet, Take 1 tablet (10 mg total) by mouth daily., Disp: 30 tablet, Rfl: 11 .  meclizine (ANTIVERT) 25 MG tablet, TAKE ONE TABLET BY MOUTH THREE TIMES DAILY AS NEEDED FOR  DIZZINESS, Disp: 60 tablet, Rfl: 0 .  omeprazole (PRILOSEC) 20 MG capsule, Take 1 capsule (20 mg total) by mouth 2 (two) times daily before a meal., Disp: 60 capsule, Rfl: 0 .  potassium chloride (K-DUR) 10 MEQ tablet, Take 1 tablet (10 mEq total) by mouth 2 (two) times daily., Disp: 60 tablet, Rfl: 1 .  predniSONE (DELTASONE) 10 MG tablet, Take 4 tabs for 2 days, then 3 tabs for 2 days, 2 tabs for 2 days, then 1 tab for 2 days, then stop., Disp: 20 tablet, Rfl: 0 .  Riociguat (ADEMPAS) 2.5 MG TABS, Take 2 mg by mouth 3 (three) times daily., Disp: 90 tablet, Rfl: 3 .  rivaroxaban (XARELTO) 20 MG TABS tablet, Take 1 tablet (20 mg total) by mouth daily with supper., Disp: 90 tablet, Rfl: 3 .  diclofenac (FLECTOR) 1.3 % PTCH, Place 1  patch onto the skin 2 (two) times daily. (Patient not taking: Reported on 05/13/2017), Disp: 60 patch, Rfl: 6 .  ferrous sulfate (FERROUSUL) 325 (65 FE) MG tablet, Take 1 tablet (325 mg total) by mouth daily with breakfast., Disp: 90 tablet, Rfl: 3 No Known Allergies   Social History   Social History  . Marital status: Married    Spouse name: N/A  . Number of children: N/A  . Years of education: N/A   Occupational History  . Not on file.   Social History Main Topics  . Smoking status: Never Smoker  . Smokeless tobacco: Never Used  . Alcohol  use No  . Drug use: No  . Sexual activity: Yes    Partners: Male    Birth control/ protection: Surgical   Other Topics Concern  . Not on file   Social History Narrative  . No narrative on file    Physical Exam  Constitutional: She is oriented to person, place, and time.  Cardiovascular: Normal rate and regular rhythm.   Pulmonary/Chest: Effort normal and breath sounds normal. No respiratory distress. She has no wheezes. She has no rales.  Abdominal: Soft.  Musculoskeletal: Normal range of motion.  Neurological: She is alert and oriented to person, place, and time.  Skin: Skin is warm and dry.  Psychiatric: She has a normal mood and affect.        SAFE - 04/15/17 1600      Situation   Admitting diagnosis chf   Heart failure history Exisiting   Comorbidities HTN;Hx DVT/PE;Sleep Apnea   Readmitted within 30 days No   Hospital admission within past 12 months Yes   number of hospital admissions 1   number of ED visits 4     Assessment   Lives alone No   Primary support person Husband   Mode of transportation personal car;scat;family/friends   Other services involved Other   Home equipement Cane;Scale;Walker     Weight   Weighs self daily Yes   Scale provided No   Records on weight chart Yes     Resources   Has "Living better w/heart failure" book Yes   Has HF Zone tool Yes   Able to identify yellow zone signs/when to call MD Yes   Records zone daily No     Medications   Uses a pill box Yes   Who stocks the pill box Paramedicine   Pill box checked this visit Yes   Pill box refilled this visit Yes   Difficulty obtaining medications Yes   Barriers to medication Financial   Community Paramedic obtains medications from pharmacy No   Mail order medications Yes   New medications at home today No   Which ones [place]   Next scheduled delivery of medications 05/09/17   Missed one or more doses of medications per week No     Nutrition   Patient receives meals on  wheels No   Patient follows low sodium diet Yes   Has foods at home that meet the current recommended diet Yes   Patient follows low sugar/card diet No   Nutritional concerns/issues Affordability     Activity Level   ADL's/Mobility Independent   How many feet can patient ambulate 20 feet   Typical activity level Inactive     Urine   Difficulty urinating No   Changes in urine None     Time spent with patient   Time spent with patient  90 Minutes  Future Appointments Date Time Provider [place]  05/15/2017 10:00 AM LBCT-CT 1 LBCT-CT LB-CT [facility]  05/15/2017 1:30 PM [facility]AD [facility] None  05/20/2017 1:30 PM [facility] UNDERGRAD [facility]RE[facility] None  05/22/2017 10:00 AM [facility]-MD[facility] ROOM [facility]-MD[facility] None  05/22/2017 1:30 PM [facility] UNDERGRAD [facility] None  05/27/2017 1:30 PM [facility] UNDERGRAD [facility]RE[facility] None  05/28/2017 9:00 AM Bensimhon, Shaune Pascal, MD [facility] None  05/29/2017 1:30 PM [facility] UNDERGRAD [facility]RE[facility] None  05/30/2017 10:15 AM Tanda Rockers, MD [facility]PULCARE None  06/03/2017 1:30 PM [facility] UNDERGRAD [facility]RE[facility] None  06/05/2017 1:30 PM [facility] RE[facility] UNDERGRAD [facility]RE[facility] None  06/10/2017 1:30 PM [facility] RE[facility] UNDERGRAD [facility]RE[facility] None  06/12/2017 1:30 PM [facility] REH[facility] UNDERGRAD [facility]RE[facility] None  06/17/2017 1:30 PM [facility] REH[facility] UNDERGRAD [facility]RE[facility] None  06/19/2017 1:30 PM [facility] REH[facility] UNDERGRAD [facility]-RE[facility] None  06/20/2017 9:00 AM [facility]-MD[facility] ROOM [facility]-MD[facility] None  06/24/2017 1:30 PM [facility] REH[facility] UNDERGRAD [facility]RE[facility] None  06/26/2017 1:30 PM [facility] REH[facility] UNDERGRAD [facility]RE[facility] None  07/23/2017 9:00 AM [facility] ROOM [facility] None  08/22/2017 9:00 AM [facility] ROOM [facility] None   BP 122/76 (BP Location: Right Arm, Patient Position: Sitting, Cuff Size: Large)   Pulse 90   Resp 16   Wt (!) 326 lb 12.8 oz (148.2 kg)   SpO2 98%   BMI 57.89 kg/m  Weight yesterday- 323 lbs Last visit weight- 327 lbs  Ms  Klutts was seen at home today and reports having multiple episodes of dizziness. She stated the episodes occur often and are not associated with one specific activity (i.e. not just when standing up from a seated position). She is still experiencing financial hardship and requests information on food pantries/assistance programs. The contractor is scheduled to install grab bars on Friday and has been given her information to set up a time to complete the work. She is still wanting to find a shower chair that she feels comfortable on, so I will research that for her. Ms Bedingfield is also feeling overwhelmed with her health condition and is looking forward to attending the support group at the heart failure clinic. Medications were verified and her pillbox was refilled.   Time spent with patient: 40 minutes  [name], EMT 05/13/17  ACTION: Home visit completed Next visit planned for 1 week

## 2017-05-13 NOTE — Telephone Encounter (Signed)
Called and spoke with pt and she stated that the rx that were sent in on 8/30 were sent to the wrong pharmacy.  Pt did not pick these up and this was verified by the pharmacy.  rx has been sent in to the [facility] on [address] and nothing further is needed.

## 2017-05-14 ENCOUNTER — Other Ambulatory Visit: Payer: Self-pay | Admitting: Internal Medicine

## 2017-05-15 ENCOUNTER — Other Ambulatory Visit: Payer: Self-pay | Admitting: Acute Care

## 2017-05-15 ENCOUNTER — Ambulatory Visit (INDEPENDENT_AMBULATORY_CARE_PROVIDER_SITE_OTHER)
Admission: RE | Admit: 2017-05-15 | Discharge: 2017-05-15 | Disposition: A | Discharge status: Home / Self Care | Payer: Medicare HMO | Source: Ambulatory Visit | Attending: Acute Care | Admitting: Acute Care

## 2017-05-15 ENCOUNTER — Encounter (HOSPITAL_COMMUNITY)
Admission: RE | Admit: 2017-05-15 | Discharge: 2017-05-15 | Disposition: A | Discharge status: Home / Self Care | Payer: Medicare HMO | Source: Ambulatory Visit | Attending: Internal Medicine | Admitting: Internal Medicine

## 2017-05-15 VITALS — Wt 330.0 lb

## 2017-05-15 DIAGNOSIS — R059 Cough, unspecified: Secondary | ICD-10-CM

## 2017-05-15 DIAGNOSIS — J309 Allergic rhinitis, unspecified: Secondary | ICD-10-CM

## 2017-05-15 DIAGNOSIS — R05 Cough: Secondary | ICD-10-CM

## 2017-05-15 DIAGNOSIS — I272 Pulmonary hypertension, unspecified: Secondary | ICD-10-CM

## 2017-05-15 NOTE — Progress Notes (Signed)
Daily Session Note  Patient Details  Name: [name] [name] MRN: [id number] Date of Birth: 1960-09-20 Referring Provider:     Pulmonary Rehab Walk Test from 03/25/2017 in [place]  Referring Provider  Dr. Haroldine Laws      Encounter Date: 05/15/2017  Check In:     Session Check In - 05/15/17 1539      Check-In   Location [facility]   Staff Present Su Hilt, MS, ACSM RCEP, Exercise Physiologist;Lisa Ysidro Evert, RN;Other;Portia Rollene Rotunda, RN, BSN   Supervising physician immediately available to respond to emergencies [facility] immediately available   Physician(s) Dr. Bonner Puna   Medication changes reported     No   Fall or balance concerns reported    No   Tobacco Cessation No Change   Warm-up and Cool-down Performed as group-led instruction   Resistance Training Performed Yes   VAD Patient? No     Pain Assessment   Currently in Pain? No/denies   Multiple Pain Sites No      Capillary Blood Glucose: No results found for this or any previous visit (from the past 24 hour(s)).      Exercise Prescription Changes - 05/15/17 1500      Response to Exercise   Blood Pressure (Admit) 133/77  Pt given gator-aide before exercise   Blood Pressure (Exercise) 126/80   Blood Pressure (Exit) 132/82   Heart Rate (Admit) 85 bpm   Heart Rate (Exercise) 112 bpm   Heart Rate (Exit) 79 bpm   Oxygen Saturation (Admit) 97 %   Oxygen Saturation (Exercise) 95 %   Oxygen Saturation (Exit) 98 %   Rating of Perceived Exertion (Exercise) 13   Perceived Dyspnea (Exercise) 1   Duration Progress to 45 minutes of aerobic exercise without signs/symptoms of physical distress   Intensity THRR unchanged     Progression   Progression Continue to progress workloads to maintain intensity without signs/symptoms of physical distress.     Resistance Training   Training Prescription Yes   Weight orange bands   Reps 10-15   Time 10 Minutes     NuStep    Level 2   Minutes 17   METs 2     Track   Laps 10   Minutes 17      History  Smoking Status  . Never Smoker  Smokeless Tobacco  . Never Used    Goals Met:  Exercise tolerated well No report of cardiac concerns or symptoms Strength training completed today  Goals Unmet:  Not Applicable  Comments: Service time is from 1:30p to 3:00p    Dr. Rush Farmer is Medical Director for Pulmonary Rehab at [facility].

## 2017-05-20 ENCOUNTER — Encounter (HOSPITAL_COMMUNITY): Payer: Medicare HMO

## 2017-05-22 ENCOUNTER — Ambulatory Visit (HOSPITAL_COMMUNITY): Admission: RE | Admit: 2017-05-22 | Payer: Medicare HMO | Source: Ambulatory Visit

## 2017-05-22 ENCOUNTER — Encounter (HOSPITAL_COMMUNITY): Payer: Medicare HMO

## 2017-05-27 ENCOUNTER — Other Ambulatory Visit (HOSPITAL_COMMUNITY): Payer: Self-pay

## 2017-05-27 ENCOUNTER — Encounter (HOSPITAL_COMMUNITY)
Admission: RE | Admit: 2017-05-27 | Discharge: 2017-05-27 | Disposition: A | Discharge status: Home / Self Care | Payer: Medicare HMO | Source: Ambulatory Visit | Attending: Internal Medicine | Admitting: Internal Medicine

## 2017-05-27 ENCOUNTER — Other Ambulatory Visit: Payer: Self-pay | Admitting: Pharmacist

## 2017-05-27 VITALS — Wt 328.5 lb

## 2017-05-27 DIAGNOSIS — I272 Pulmonary hypertension, unspecified: Secondary | ICD-10-CM

## 2017-05-27 MED ORDER — GABAPENTIN 300 MG PO CAPS: 300.0000 mg | ORAL_CAPSULE | Freq: Three times a day (TID) | ORAL | 1 refills | Status: DC
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
## 2017-05-27 NOTE — Progress Notes (Signed)
Daily Session Note  Patient Details  Name: [name] [name] MRN: [id number] Date of Birth: Jun 04, 1961 Referring Provider:     Pulmonary Rehab Walk Test from 03/25/2017 in [place]  Referring Provider  Dr. Haroldine Laws      Encounter Date: 05/27/2017  Check In:     Session Check In - 05/27/17 1330      Check-In   Location [facility]   Staff Present Rodney Langton, RN;Molly diVincenzo, MS, ACSM RCEP, Exercise Physiologist;Bambi Fehnel Rollene Rotunda, RN, BSN   Supervising physician immediately available to respond to emergencies [facility] immediately available   Physician(s) Dr. Carolin Sicks   Medication changes reported     No   Fall or balance concerns reported    No   Tobacco Cessation No Change   Warm-up and Cool-down Performed as group-led instruction   Resistance Training Performed Yes   VAD Patient? No     Pain Assessment   Currently in Pain? No/denies   Multiple Pain Sites No      Capillary Blood Glucose: No results found for this or any previous visit (from the past 24 hour(s)).      Exercise Prescription Changes - 05/27/17 1603      Response to Exercise   Blood Pressure (Admit) 134/83  Pt given gator-aide before exercise   Blood Pressure (Exercise) 127/71   Blood Pressure (Exit) 130/86   Heart Rate (Admit) 117 bpm   Heart Rate (Exercise) 145 bpm   Heart Rate (Exit) 101 bpm   Oxygen Saturation (Admit) 95 %   Oxygen Saturation (Exercise) 91 %   Oxygen Saturation (Exit) 97 %   Rating of Perceived Exertion (Exercise) 13   Perceived Dyspnea (Exercise) 3   Duration Progress to 45 minutes of aerobic exercise without signs/symptoms of physical distress   Intensity THRR unchanged     Progression   Progression Continue to progress workloads to maintain intensity without signs/symptoms of physical distress.     Resistance Training   Training Prescription Yes   Weight orange bands   Reps 10-15   Time 10 Minutes     NuStep    Level 2   Minutes 17   METs 2     Track   Laps 15   Minutes 34      History  Smoking Status  . Never Smoker  Smokeless Tobacco  . Never Used    Goals Met:  Changing diet to healthy choices, watching portion sizes Exercise tolerated well No report of cardiac concerns or symptoms Strength training completed today  Goals Unmet:  Not Applicable  Comments: Service time is from 1330 to 1505   Dr. Rush Farmer is Medical Director for Pulmonary Rehab at [facility].

## 2017-05-28 ENCOUNTER — Encounter (HOSPITAL_COMMUNITY): Payer: Self-pay | Admitting: Internal Medicine

## 2017-05-28 ENCOUNTER — Ambulatory Visit (HOSPITAL_COMMUNITY)
Admission: RE | Admit: 2017-05-28 | Discharge: 2017-05-28 | Disposition: A | Discharge status: Home / Self Care | Payer: Medicare HMO | Source: Ambulatory Visit | Attending: Internal Medicine | Admitting: Internal Medicine

## 2017-05-28 VITALS — BP 121/69 | HR 100 | Wt 330.2 lb

## 2017-05-28 DIAGNOSIS — F419 Anxiety disorder, unspecified: Secondary | ICD-10-CM | POA: Diagnosis not present

## 2017-05-28 DIAGNOSIS — Z820 Family history of epilepsy and other diseases of the nervous system: Secondary | ICD-10-CM | POA: Insufficient documentation

## 2017-05-28 DIAGNOSIS — E669 Obesity, unspecified: Secondary | ICD-10-CM

## 2017-05-28 DIAGNOSIS — I270 Primary pulmonary hypertension: Secondary | ICD-10-CM

## 2017-05-28 DIAGNOSIS — Z818 Family history of other mental and behavioral disorders: Secondary | ICD-10-CM | POA: Insufficient documentation

## 2017-05-28 DIAGNOSIS — Z813 Family history of other psychoactive substance abuse and dependence: Secondary | ICD-10-CM | POA: Insufficient documentation

## 2017-05-28 DIAGNOSIS — Z79899 Other long term (current) drug therapy: Secondary | ICD-10-CM | POA: Diagnosis not present

## 2017-05-28 DIAGNOSIS — Z9889 Other specified postprocedural states: Secondary | ICD-10-CM | POA: Diagnosis not present

## 2017-05-28 DIAGNOSIS — M797 Fibromyalgia: Secondary | ICD-10-CM | POA: Insufficient documentation

## 2017-05-28 DIAGNOSIS — F329 Major depressive disorder, single episode, unspecified: Secondary | ICD-10-CM | POA: Diagnosis not present

## 2017-05-28 DIAGNOSIS — I10 Essential (primary) hypertension: Secondary | ICD-10-CM | POA: Diagnosis not present

## 2017-05-28 DIAGNOSIS — Z86711 Personal history of pulmonary embolism: Secondary | ICD-10-CM | POA: Diagnosis not present

## 2017-05-28 DIAGNOSIS — R079 Chest pain, unspecified: Secondary | ICD-10-CM | POA: Diagnosis not present

## 2017-05-28 DIAGNOSIS — R002 Palpitations: Secondary | ICD-10-CM | POA: Diagnosis not present

## 2017-05-28 DIAGNOSIS — Z801 Family history of malignant neoplasm of trachea, bronchus and lung: Secondary | ICD-10-CM | POA: Insufficient documentation

## 2017-05-28 DIAGNOSIS — E785 Hyperlipidemia, unspecified: Secondary | ICD-10-CM | POA: Diagnosis not present

## 2017-05-28 DIAGNOSIS — R42 Dizziness and giddiness: Secondary | ICD-10-CM | POA: Insufficient documentation

## 2017-05-28 DIAGNOSIS — I2724 Chronic thromboembolic pulmonary hypertension: Secondary | ICD-10-CM | POA: Diagnosis not present

## 2017-05-28 DIAGNOSIS — Z7901 Long term (current) use of anticoagulants: Secondary | ICD-10-CM | POA: Diagnosis not present

## 2017-05-28 DIAGNOSIS — G4733 Obstructive sleep apnea (adult) (pediatric): Secondary | ICD-10-CM | POA: Diagnosis not present

## 2017-05-28 DIAGNOSIS — Z9049 Acquired absence of other specified parts of digestive tract: Secondary | ICD-10-CM | POA: Diagnosis not present

## 2017-05-28 DIAGNOSIS — G8929 Other chronic pain: Secondary | ICD-10-CM | POA: Diagnosis not present
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
## 2017-05-28 NOTE — Patient Instructions (Signed)
Will schedule you for a holter monitor (heart monitor) to be placed at [facility] office. Address: [address] ([place]), [place], [place] [address]  Phone: [phone]  Will refer you to the [facility]. Address: [address], [place], [place] [address] Phone: [phone]  Follow up 4-6 months with Dr. Haroldine Laws.  Take all medication as prescribed the day of your appointment. Bring all medications with you to your appointment.  Do the following things EVERYDAY: 1) Weigh yourself in the morning before breakfast. Write it down and keep it in a log. 2) Take your medicines as prescribed 3) Eat low salt foods-Limit salt (sodium) to 2000 mg per day.  4) Stay as active as you can everyday 5) Limit all fluids for the day to less than 2 liters

## 2017-05-29 ENCOUNTER — Encounter (HOSPITAL_COMMUNITY)
Admission: RE | Admit: 2017-05-29 | Discharge: 2017-05-29 | Disposition: A | Discharge status: Home / Self Care | Payer: Medicare HMO | Source: Ambulatory Visit | Attending: Internal Medicine | Admitting: Internal Medicine

## 2017-05-29 DIAGNOSIS — I272 Pulmonary hypertension, unspecified: Secondary | ICD-10-CM | POA: Diagnosis not present

## 2017-05-29 NOTE — Progress Notes (Signed)
Daily Session Note  Patient Details  Name: [name] [name] MRN: [id number] Date of Birth: 1961/03/05 Referring Provider:     Pulmonary Rehab Walk Test from 03/25/2017 in [facility]  Referring Provider  Dr. Haroldine Laws      Encounter Date: 05/29/2017  Check In:     Session Check In - 05/29/17 1353      Check-In   Location [facility]   Staff Present Su Hilt, MS, ACSM RCEP, Exercise Physiologist;Chelsi Warr Rollene Rotunda, RN, BSN   Supervising physician immediately available to respond to emergencies [facility] immediately available   Physician(s) Dr. Wynelle Cleveland   Medication changes reported     No   Fall or balance concerns reported    No   Tobacco Cessation No Change   Warm-up and Cool-down Performed as group-led instruction   Resistance Training Performed Yes   VAD Patient? No     Pain Assessment   Currently in Pain? No/denies   Multiple Pain Sites No      Capillary Blood Glucose: No results found for this or any previous visit (from the past 24 hour(s)).      Exercise Prescription Changes - 05/29/17 1600      Response to Exercise   Blood Pressure (Admit) 100/80  Pt given gator-aide before exercise   Blood Pressure (Exercise) 110/64   Blood Pressure (Exit) 104/64   Heart Rate (Admit) 94 bpm   Heart Rate (Exercise) 136 bpm   Heart Rate (Exit) 101 bpm   Oxygen Saturation (Admit) 100 %   Oxygen Saturation (Exercise) 95 %   Oxygen Saturation (Exit) 99 %   Rating of Perceived Exertion (Exercise) 13   Perceived Dyspnea (Exercise) 2   Duration Progress to 45 minutes of aerobic exercise without signs/symptoms of physical distress   Intensity THRR unchanged     Progression   Progression Continue to progress workloads to maintain intensity without signs/symptoms of physical distress.     Resistance Training   Training Prescription Yes   Weight orange bands   Reps 10-15   Time 10 Minutes     Track   Laps 12   Minutes 17      History  Smoking Status  . Never Smoker  Smokeless Tobacco  . Never Used    Goals Met:  Using PLB without cueing & demonstrates good technique Exercise tolerated well No report of cardiac concerns or symptoms Strength training completed today  Goals Unmet:  Not Applicable  Comments: Service time is from 1330 to 1530   Dr. Rush Farmer is Medical Director for Pulmonary Rehab at [facility].

## 2017-05-29 NOTE — Progress Notes (Signed)
[name] [name] 56 y.o. female  DOB: 1960/10/02 MRN: [id number]           Nutrition Note Dx: Pulmonary HTN Note Spoke with pt. Pt is obese. Pt reports h/o 100 lb wt loss. Pt wt is up 14 lb over the past 1.5 years. Pt states she is an emotional eater and life has been very stressful recently. Pt's mom came to stay with pt during [facility] and had a stroke while here. Pt's mom lost her house in the hurricane. Pt's 64 year old grandson lives with pt. Per discussion, pt receives $40 in Food Stamps monthly. Meditation/mindful eating discussed. There are some ways the pt can make her eating habits healthier. Pt's Rate Your Plate results reviewed with pt. Pt does not avoid salty food; uses canned/ convenience food.  Pt adds salt to food.  The role of sodium in lung disease reviewed with pt. Pt expressed understanding of the information reviewed via feedback method.    Nutrition Diagnosis ? Food-and nutrition-related knowledge deficit related to lack of exposure to information as related to diagnosis of pulmonary disease ? Obesity related to excessive energy intake as evidenced by a BMI of 55.9  Nutrition Intervention ? Pt to try meditation to help increase mindful eating. ? Pt's individual nutrition plan and goals reviewed with pt. ? Benefits of adopting healthy eating habits discussed when pt's Rate Your Plate reviewed.  Goal(s) 1. Identify food quantities necessary to achieve wt loss of  -2# per week to a goal wt loss of 2.7-10.9 kg (6-24 lb) at graduation from pulmonary rehab.  Plan:  Pt to attend Pulmonary Nutrition class Will provide client-centered nutrition education as part of interdisciplinary care.   Monitor and evaluate progress toward nutrition goal with team.  Monitor and Evaluate progress toward nutrition goal with team.   Derek Mound, M.Ed, RD, LDN, CDE 05/29/2017 3:26 PM

## 2017-05-30 ENCOUNTER — Ambulatory Visit: Payer: Self-pay | Admitting: Internal Medicine

## 2017-05-30 NOTE — Progress Notes (Signed)
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 purse lipped breathing and diaphragmatic breathing and practicing self-pacing with activity.;Improve shortness of breath with ADL's      Review patient has only attended 2 sessions since admission. too soon to evaluate progress towards goals patient has only attended 5 exercise sessions since admission but only 2 have been consecutive. expect to see great progress towards pulmonary rehab goals over the next 30 days      Expected Outcomes see admission expected outcomes see admission expected outcomes         Core Components/Risk Factors/Patient Goals at Discharge (Final Review):      Goals and Risk Factor Review -  05/30/17 1401      Core Components/Risk Factors/Patient Goals Review   Personal Goals Review Weight Management/Obesity;Develop more efficient breathing techniques such as purse lipped breathing and diaphragmatic breathing and practicing self-pacing with activity.;Improve shortness of breath with ADL's   Review patient has only attended 5 exercise sessions since admission but only 2 have been consecutive. expect to see great progress towards pulmonary rehab goals over the next 30 days   Expected Outcomes see admission expected outcomes      ITP Comments:   Comments: ITP REVIEW Pt is making slow progress toward pulmonary rehab goals after completing 5 sessions. Expect to see more progress over the next 30 days. Recommend continued exercise, life style modification, education, and utilization  of breathing techniques to increase stamina and strength and decrease shortness of breath with exertion.

## 2017-06-02 ENCOUNTER — Emergency Department (HOSPITAL_COMMUNITY): Payer: Medicare HMO

## 2017-06-02 ENCOUNTER — Encounter (HOSPITAL_COMMUNITY): Payer: Self-pay

## 2017-06-02 ENCOUNTER — Other Ambulatory Visit: Payer: Self-pay

## 2017-06-02 DIAGNOSIS — F329 Major depressive disorder, single episode, unspecified: Secondary | ICD-10-CM | POA: Diagnosis not present

## 2017-06-02 DIAGNOSIS — M545 Low back pain: Secondary | ICD-10-CM | POA: Diagnosis not present

## 2017-06-02 DIAGNOSIS — Z9049 Acquired absence of other specified parts of digestive tract: Secondary | ICD-10-CM | POA: Diagnosis not present

## 2017-06-02 DIAGNOSIS — Z79899 Other long term (current) drug therapy: Secondary | ICD-10-CM | POA: Insufficient documentation

## 2017-06-02 DIAGNOSIS — I110 Hypertensive heart disease with heart failure: Secondary | ICD-10-CM | POA: Insufficient documentation

## 2017-06-02 DIAGNOSIS — Z7901 Long term (current) use of anticoagulants: Secondary | ICD-10-CM | POA: Diagnosis not present

## 2017-06-02 DIAGNOSIS — Z8673 Personal history of transient ischemic attack (TIA), and cerebral infarction without residual deficits: Secondary | ICD-10-CM | POA: Insufficient documentation

## 2017-06-02 DIAGNOSIS — E876 Hypokalemia: Secondary | ICD-10-CM | POA: Insufficient documentation

## 2017-06-02 DIAGNOSIS — M79601 Pain in right arm: Secondary | ICD-10-CM | POA: Insufficient documentation

## 2017-06-02 DIAGNOSIS — R0600 Dyspnea, unspecified: Secondary | ICD-10-CM | POA: Diagnosis present

## 2017-06-02 DIAGNOSIS — F419 Anxiety disorder, unspecified: Secondary | ICD-10-CM | POA: Diagnosis not present

## 2017-06-02 DIAGNOSIS — I509 Heart failure, unspecified: Secondary | ICD-10-CM | POA: Diagnosis not present

## 2017-06-02 DIAGNOSIS — R0602 Shortness of breath: Secondary | ICD-10-CM | POA: Diagnosis not present

## 2017-06-02 LAB — I-STAT TROPONIN, ED: Troponin i, poc: 0 ng/mL (ref 0.00–0.08)

## 2017-06-02 LAB — CBC
HCT: 36.9 % (ref 36.0–46.0)
Hemoglobin: 12.2 g/dL (ref 12.0–15.0)
MCH: 21.2 pg — ABNORMAL LOW (ref 26.0–34.0)
MCHC: 33.1 g/dL (ref 30.0–36.0)
MCV: 64.2 fL — ABNORMAL LOW (ref 78.0–100.0)
Platelets: 281 10*3/uL (ref 150–400)
RBC: 5.75 MIL/uL — ABNORMAL HIGH (ref 3.87–5.11)
RDW: 18.1 % — ABNORMAL HIGH (ref 11.5–15.5)
WBC: 5.8 10*3/uL (ref 4.0–10.5)

## 2017-06-02 LAB — BASIC METABOLIC PANEL
Anion gap: 7 (ref 5–15)
BUN: 12 mg/dL (ref 6–20)
CO2: 26 mmol/L (ref 22–32)
Calcium: 9.7 mg/dL (ref 8.9–10.3)
Chloride: 108 mmol/L (ref 101–111)
Creatinine, Ser: 0.88 mg/dL (ref 0.44–1.00)
GFR calc Af Amer: >60 mL/min (ref >60)
GFR calc non Af Amer: >60 mL/min (ref >60)
Glucose, Bld: 92 mg/dL (ref 65–99)
Potassium: 3.3 mmol/L — ABNORMAL LOW (ref 3.5–5.1)
Sodium: 141 mmol/L (ref 135–145)

## 2017-06-02 NOTE — ED Triage Notes (Signed)
Pt reports she has increased sob and pain that shoots up right arm across to center of chest since Friday. Pt states she is a chf pt and home health nurse sent her here to be seen. PT also reports left lower back pain that is worse with inspiration.

## 2017-06-03 ENCOUNTER — Other Ambulatory Visit (HOSPITAL_COMMUNITY): Payer: Self-pay

## 2017-06-03 ENCOUNTER — Emergency Department (HOSPITAL_COMMUNITY)
Admission: EM | Admit: 2017-06-03 | Discharge: 2017-06-03 | Disposition: A | Discharge status: Home / Self Care | Payer: Medicare HMO | Attending: Emergency Medicine | Admitting: Emergency Medicine

## 2017-06-03 ENCOUNTER — Encounter (HOSPITAL_COMMUNITY)
Admission: RE | Admit: 2017-06-03 | Discharge: 2017-06-03 | Disposition: A | Discharge status: Home / Self Care | Payer: Medicare HMO | Source: Ambulatory Visit | Attending: Internal Medicine | Admitting: Internal Medicine

## 2017-06-03 DIAGNOSIS — I272 Pulmonary hypertension, unspecified: Secondary | ICD-10-CM

## 2017-06-03 DIAGNOSIS — E876 Hypokalemia: Secondary | ICD-10-CM

## 2017-06-03 DIAGNOSIS — M545 Low back pain, unspecified: Secondary | ICD-10-CM

## 2017-06-03 DIAGNOSIS — M79601 Pain in right arm: Secondary | ICD-10-CM

## 2017-06-03 DIAGNOSIS — I509 Heart failure, unspecified: Secondary | ICD-10-CM

## 2017-06-03 LAB — I-STAT TROPONIN, ED: Troponin i, poc: 0 ng/mL (ref 0.00–0.08)

## 2017-06-03 LAB — BRAIN NATRIURETIC PEPTIDE: B Natriuretic Peptide: 13.5 pg/mL (ref 0.0–100.0)

## 2017-06-03 MED ORDER — KETOROLAC TROMETHAMINE 30 MG/ML IJ SOLN
30.0000 mg | Freq: Once | INTRAMUSCULAR | Status: AC
  Administered 2017-06-03: 30 mg via INTRAVENOUS
  Filled 2017-06-03: qty 1

## 2017-06-03 MED ORDER — FUROSEMIDE 10 MG/ML IJ SOLN
40.0000 mg | Freq: Once | INTRAMUSCULAR | Status: AC
  Administered 2017-06-03: 40 mg via INTRAVENOUS
  Filled 2017-06-03: qty 4

## 2017-06-03 MED ORDER — HEPARIN SOD (PORK) LOCK FLUSH 100 UNIT/ML IV SOLN
500.0000 [IU] | Freq: Once | INTRAVENOUS | Status: AC
  Administered 2017-06-03: 500 [IU]
  Filled 2017-06-03: qty 5

## 2017-06-03 MED ORDER — TRAMADOL HCL 50 MG PO TABS: 50.0000 mg | ORAL_TABLET | Freq: Four times a day (QID) | ORAL | 0 refills | Status: DC | PRN

## 2017-06-03 MED ORDER — ORPHENADRINE CITRATE ER 100 MG PO TB12: 100.0000 mg | ORAL_TABLET | Freq: Two times a day (BID) | ORAL | 0 refills | Status: DC

## 2017-06-03 MED ORDER — POTASSIUM CHLORIDE CRYS ER 20 MEQ PO TBCR: 20.0000 meq | EXTENDED_RELEASE_TABLET | Freq: Two times a day (BID) | ORAL | 0 refills | Status: DC

## 2017-06-03 MED ORDER — POTASSIUM CHLORIDE CRYS ER 20 MEQ PO TBCR
40.0000 meq | EXTENDED_RELEASE_TABLET | Freq: Once | ORAL | Status: AC
  Administered 2017-06-03: 40 meq via ORAL
  Filled 2017-06-03: qty 2

## 2017-06-03 NOTE — Progress Notes (Signed)
Paramedicine Encounter    Patient ID: [name] [name], female    DOB: [birth year], 56 y.o.   MRN: [id number]   Patient Care Team: Ladell Pier, MD as PCP - General (Internal Medicine) Loletta Specter, RN as [place] Management  Patient Active Problem List   Diagnosis Date Noted  . Chronic cough 04/10/2017  . Rheumatoid arthritis involving multiple sites ([place]) 04/10/2017  . Anxiety and depression 04/10/2017  . Adjustment disorder with depressed mood 12/11/2016  . Acute upper respiratory infection 12/11/2016  . GAD (generalized anxiety disorder) 08/14/2016  . Morbid obesity ([place]) 08/14/2016  . Stroke ([place]) 06/27/2016  . Right sided weakness 06/27/2016  . Essential hypertension 06/27/2016  . Atypical chest pain 06/27/2016  . Difficult airway 03/29/2016  . CTEPH (chronic thromboembolic pulmonary hypertension) ([place]) 03/14/2016  . OSA on CPAP 02/20/2016  . Chronic pain syndrome 01/31/2016  . Vaginal bleeding 11/20/2015  . Hematochezia 11/20/2015  . Pulmonary embolus ([place]) 11/15/2015  . Vertigo 11/15/2015  . Depression 11/15/2015    Current Outpatient Prescriptions:  .  acetaminophen-codeine (TYLENOL #3) 300-30 MG tablet, Take 1 tablet by mouth every 8 (eight) hours as needed for moderate pain. Max total Tyelnol < 2000mg  /day, Disp: 50 tablet, Rfl: 2 .  albuterol (PROVENTIL HFA;VENTOLIN HFA) 108 (90 Base) MCG/ACT inhaler, Inhale 1-2 puffs into the lungs every 6 (six) hours as needed for wheezing or shortness of breath., Disp: 6.7 g, Rfl: 5 .  atorvastatin (LIPITOR) 20 MG tablet, Take 1 tablet (20 mg total) by mouth daily at 6 PM., Disp: 90 tablet, Rfl: 2 .  docusate sodium (COLACE) 100 MG capsule, Take 1 capsule (100 mg total) by mouth every 12 (twelve) hours., Disp: 30 capsule, Rfl: 0 .  ferrous sulfate (FERROUSUL) 325 (65 FE) MG tablet, Take 1 tablet (325 mg total) by mouth daily with breakfast., Disp: 90 tablet, Rfl: 3 .  fluticasone (FLONASE) 50  MCG/ACT nasal spray, Place 2 sprays into both nostrils daily., Disp: 16 g, Rfl: 6 .  furosemide (LASIX) 40 MG tablet, Take 1 tablet (40 mg total) by mouth daily. Take extra 40 mg tablet once in the afternoon AS NEEDED for weight gain 3 lbs or more., Disp: 60 tablet, Rfl: 3 .  gabapentin (NEURONTIN) 300 MG capsule, Take 1 capsule (300 mg total) by mouth 3 (three) times daily., Disp: 90 capsule, Rfl: 1 .  loratadine (CLARITIN) 10 MG tablet, Take 1 tablet (10 mg total) by mouth daily., Disp: 30 tablet, Rfl: 11 .  meclizine (ANTIVERT) 25 MG tablet, TAKE ONE TABLET BY MOUTH THREE TIMES DAILY AS NEEDED FOR  DIZZINESS, Disp: 60 tablet, Rfl: 0 .  orphenadrine (NORFLEX) 100 MG tablet, Take 1 tablet (100 mg total) by mouth 2 (two) times daily., Disp: 30 tablet, Rfl: 0 .  potassium chloride SA (K-DUR,KLOR-CON) 20 MEQ tablet, Take 1 tablet (20 mEq total) by mouth 2 (two) times daily., Disp: 10 tablet, Rfl: 0 .  Riociguat (ADEMPAS) 2 MG TABS, Take 2 mg by mouth 3 (three) times daily., Disp: , Rfl:  .  rivaroxaban (XARELTO) 20 MG TABS tablet, Take 1 tablet (20 mg total) by mouth daily with supper., Disp: 90 tablet, Rfl: 3 .  traMADol (ULTRAM) 50 MG tablet, Take 1 tablet (50 mg total) by mouth every 6 (six) hours as needed., Disp: 15 tablet, Rfl: 0 .  potassium chloride (K-DUR) 10 MEQ tablet, TAKE 1 TABLET BY MOUTH TWICE DAILY (Patient not taking: Reported on 06/03/2017), Disp: 180 tablet, Rfl: 1  No Known Allergies   Social History   Social History  . Marital status: Married    Spouse name: N/A  . Number of children: N/A  . Years of education: N/A   Occupational History  . Not on file.   Social History Main Topics  . Smoking status: Never Smoker  . Smokeless tobacco: Never Used  . Alcohol use No  . Drug use: No  . Sexual activity: Yes    Partners: Male    Birth control/ protection: Surgical   Other Topics Concern  . Not on file   Social History Narrative  . No narrative on file    Physical  Exam  Constitutional: She is oriented to person, place, and time.  Cardiovascular: Normal rate and regular rhythm.   Pulmonary/Chest: Effort normal and breath sounds normal. No respiratory distress. She has no wheezes. She has no rales.  Abdominal: Soft.  Musculoskeletal: Normal range of motion. She exhibits no edema.  Neurological: She is alert and oriented to person, place, and time.  Skin: Skin is warm and dry.  Psychiatric: She has a normal mood and affect.        SAFE - 04/15/17 1600      Situation   Admitting diagnosis chf   Heart failure history Exisiting   Comorbidities HTN;Hx DVT/PE;Sleep Apnea   Readmitted within 30 days No   Hospital admission within past 12 months Yes   number of hospital admissions 1   number of ED visits 4     Assessment   Lives alone No   Primary support person Husband   Mode of transportation personal car;scat;family/friends   Other services involved Other   Home equipement Cane;Scale;Walker     Weight   Weighs self daily Yes   Scale provided No   Records on weight chart Yes     Resources   Has "Living better w/heart failure" book Yes   Has HF Zone tool Yes   Able to identify yellow zone signs/when to call MD Yes   Records zone daily No     Medications   Uses a pill box Yes   Who stocks the pill box Paramedicine   Pill box checked this visit Yes   Pill box refilled this visit Yes   Difficulty obtaining medications Yes   Barriers to medication Financial   Community Paramedic obtains medications from pharmacy No   Mail order medications Yes   New medications at home today No   Which ones [place]   Next scheduled delivery of medications 05/09/17   Missed one or more doses of medications per week No     Nutrition   Patient receives meals on wheels No   Patient follows low sodium diet Yes   Has foods at home that meet the current recommended diet Yes   Patient follows low sugar/card diet No   Nutritional concerns/issues  Affordability     Activity Level   ADL's/Mobility Independent   How many feet can patient ambulate 20 feet   Typical activity level Inactive     Urine   Difficulty urinating No   Changes in urine None     Time spent with patient   Time spent with patient  90 Minutes        Future Appointments Date Time Provider [place]  06/05/2017 1:30 PM [facility] UNDERGRAD [facility] None  06/06/2017 10:30 AM Ladell Pier, MD [facility] None  06/09/2017 3:15 PM Melvyn Novas Christena Deem, MD [facility]UL[facility] None  06/10/2017 9:30 AM [facility] PA/NP [facility] None  06/10/2017 1:30 PM [facility] UNDERGRAD [facility] None  06/12/2017 10:30 AM [facility]ITOR [facility]  06/12/2017 1:30 PM [facility] UNDERGRAD [facility] None  06/17/2017 1:30 PM [facility] UNDERGRAD [facility] None  06/19/2017 1:30 PM [facility] UNDERGRAD [facility] None  06/20/2017 9:00 AM [facility] ROOM [facility]-MD[facility] None  06/24/2017 1:30 PM [facility] UNDERGRAD [facility] None  06/26/2017 1:30 PM [facility] UNDERGRAD [facility] None  07/23/2017 9:00 AM [facility] ROOM [facility] None  08/22/2017 9:00 AM [facility] ROOM [facility] None   BP 136/86 (BP Location: Right Arm, Patient Position: Sitting, Cuff Size: Large)   Pulse 82   Resp 18   Wt (!) 326 lb 6.4 oz (148.1 kg)   SpO2 98%   BMI 57.82 kg/m  Weight yesterday- 328 lb Last visit weight- 328.2 lb  Ms Kutzer was seen at home today after spending last night in the ED for SOB and pain in multiple sites. She said she was given Lasix in the ED and her potassium was low. She reports feeling better today. She denied feeling SOB and her pain was managed with her new medication. She reports being under a significant amount of stress with her own health and her mother in the hospital with renal failure. The contractor was contacted about the grab bars and stated that the company was having issues internally but he promised to have the bars installed by  Friday of this week. Darnelle Bos has been given the shower chair which Ms Loomer wants for herself and will order it soon. He medications were verified and her pillbox was refilled.  Time spent with patient: 65 minutes  [name], EMT 06/03/17  ACTION: Home visit completed Next visit planned for 1 week

## 2017-06-03 NOTE — Discharge Instructions (Signed)
Follow up with the Heart Failure Clinic today.

## 2017-06-03 NOTE — ED Notes (Signed)
Pt requesting port access, IV team consult made

## 2017-06-03 NOTE — ED Provider Notes (Signed)
[place] DEPT Provider Note   CSN: [id number] Arrival date & time: 06/02/17  1817     History   Chief Complaint Chief Complaint  Patient presents with  . Chest Pain  . Shortness of Breath  . Back Pain    HPI [name] [name] is a 56 y.o. female.  The history is provided by the patient.  Chest Pain   Associated symptoms include back pain and shortness of breath.  Shortness of Breath  Associated symptoms include chest pain.  Back Pain   Associated symptoms include chest pain.  She has a history of congestive heart failure and is managed through the CHF clinic. Over the last 3 days, she has had increasing difficulty breathing. This has included paroxysmal nocturnal dyspnea. She had 4 pound weight gain and did take a dose of furosemide. This did seem to reduce her peripheral edema, but did not affect her breathing. She is also complaining of pain in the left lower back for about the last 5 days. Pain does not radiate. She rates pain at 9/10. It is worse with movement. Nothing makes it better. She has taken Tylenol 3 without relief. She also has been having pains in the right arm for the last 2 weeks. This pain is worse with movement of the arm. It does sometimes radiate up toward the chest. She denies any weakness, numbness, tingling. She denies any bowel or bladder dysfunction.  Past Medical History:  Diagnosis Date  . Arthritis   . Depression   . Diverticulitis   . Hyperlipidemia   . Hypertension   . Obesity   . Pneumonia   . Pulmonary embolism [facility])     Patient Active Problem List   Diagnosis Date Noted  . Chronic cough 04/10/2017  . Rheumatoid arthritis involving multiple sites ([place]) 04/10/2017  . Anxiety and depression 04/10/2017  . Adjustment disorder with depressed mood 12/11/2016  . Acute upper respiratory infection 12/11/2016  . GAD (generalized anxiety disorder) 08/14/2016  . Morbid obesity ([place]) 08/14/2016  . Stroke ([place]) 06/27/2016  . Right sided weakness  06/27/2016  . Essential hypertension 06/27/2016  . Atypical chest pain 06/27/2016  . Difficult airway 03/29/2016  . CTEPH (chronic thromboembolic pulmonary hypertension) ([place]) 03/14/2016  . OSA on CPAP 02/20/2016  . Chronic pain syndrome 01/31/2016  . Vaginal bleeding 11/20/2015  . Hematochezia 11/20/2015  . Pulmonary embolus ([place]) 11/15/2015  . Vertigo 11/15/2015  . Depression 11/15/2015    Past Surgical History:  Procedure Laterality Date  . ABDOMINAL HYSTERECTOMY    . BREAST REDUCTION SURGERY    . CHOLECYSTECTOMY    . EMBOLECTOMY N/A    pulmonary embolectomy  . REDUCTION MAMMAPLASTY Bilateral   . RIGHT HEART CATH N/A 10/28/2016   Procedure: Right Heart Cath;  Surgeon: Jolaine Artist, MD;  Location: [place] CV LAB;  Service: Cardiovascular;  Laterality: N/A;    OB History    No data available       Home Medications    Prior to Admission medications   Medication Sig Start Date End Date Taking? Authorizing Provider  acetaminophen-codeine (TYLENOL #3) 300-30 MG tablet Take 1 tablet by mouth every 8 (eight) hours as needed for moderate pain. Max total Tyelnol < 2000mg  /day 04/10/17   Ladell Pier, MD  albuterol (PROVENTIL HFA;VENTOLIN HFA) 108 (90 Base) MCG/ACT inhaler Inhale 1-2 puffs into the lungs every 6 (six) hours as needed for wheezing or shortness of breath. 04/10/17   Ladell Pier, MD  atorvastatin (LIPITOR) 20  MG tablet Take 1 tablet (20 mg total) by mouth daily at 6 PM. 01/27/17   Langeland, Dawn T, MD  benzonatate (TESSALON PERLES) 100 MG capsule Take 1 capsule (100 mg total) by mouth 3 (three) times daily as needed for cough. 05/13/17   Magdalen Spatz, NP  diclofenac (FLECTOR) 1.3 % PTCH Place 1 patch onto the skin 2 (two) times daily. Patient not taking: Reported on 05/28/2017 10/16/16   Bensimhon, Shaune Pascal, MD  docusate sodium (COLACE) 100 MG capsule Take 1 capsule (100 mg total) by mouth every 12 (twelve) hours. 10/24/16   Margarita Mail, PA-C    ferrous sulfate (FERROUSUL) 325 (65 FE) MG tablet Take 1 tablet (325 mg total) by mouth daily with breakfast. 12/25/16   Langeland, Dawn T, MD  fluticasone (FLONASE) 50 MCG/ACT nasal spray Place 2 sprays into both nostrils daily. 02/04/17   Maren Reamer, MD  furosemide (LASIX) 40 MG tablet Take 1 tablet (40 mg total) by mouth daily. Take extra 40 mg tablet once in the afternoon AS NEEDED for weight gain 3 lbs or more. 01/27/17   Maren Reamer, MD  gabapentin (NEURONTIN) 300 MG capsule Take 1 capsule (300 mg total) by mouth 3 (three) times daily. 05/27/17 06/26/17  Ladell Pier, MD  loratadine (CLARITIN) 10 MG tablet Take 1 tablet (10 mg total) by mouth daily. 03/03/17   Argentina Donovan, PA-C  meclizine (ANTIVERT) 25 MG tablet TAKE ONE TABLET BY MOUTH THREE TIMES DAILY AS NEEDED FOR  DIZZINESS 04/03/17   Bensimhon, Shaune Pascal, MD  omeprazole (PRILOSEC) 20 MG capsule Take 1 capsule (20 mg total) by mouth 2 (two) times daily before a meal. 05/13/17   Magdalen Spatz, NP  potassium chloride (K-DUR) 10 MEQ tablet TAKE 1 TABLET BY MOUTH TWICE DAILY 05/14/17   Ladell Pier, MD  Riociguat (ADEMPAS) 2 MG TABS Take 2 mg by mouth 3 (three) times daily.    [provider]  rivaroxaban (XARELTO) 20 MG TABS tablet Take 1 tablet (20 mg total) by mouth daily with supper. 01/27/17   Maren Reamer, MD    Family History Family History  Problem Relation Age of Onset  . Lung cancer Maternal Grandmother   . Dementia Mother   . Dementia Father   . Anxiety disorder Sister   . Bipolar disorder Sister   . Drug abuse Sister   . Sexual abuse Maternal Aunt   . Anxiety disorder Cousin   . Anxiety disorder Sister   . Bipolar disorder Sister   . Drug abuse Sister   . Breast cancer Neg Hx     Social History Social History  Substance Use Topics  . Smoking status: Never Smoker  . Smokeless tobacco: Never Used  . Alcohol use No     Allergies   [name].   Review of  Systems Review of Systems  Respiratory: Positive for shortness of breath.   Cardiovascular: Positive for chest pain.  Musculoskeletal: Positive for back pain.  All other systems reviewed and are negative.    Physical Exam Updated Vital Signs BP (!) 122/55 (BP Location: Left Arm)   Pulse 82   Temp 98.8 F (37.1 C) (Oral)   Resp 18   Wt (!) 147.9 kg (326 lb)   SpO2 99%   BMI 57.75 kg/m   Physical Exam  Nursing note and vitals reviewed.  Morbidly obese 56 year old female, resting comfortably and in no acute distress. Vital signs  are normal. Oxygen saturation is 99%, which is normal. Head is normocephalic and atraumatic. PERRLA, EOMI. Oropharynx is clear. Neck is nontender and supple without adenopathy or JVD. Back is mildly tender in the left paralumbar area. Straight leg raise is positive on the left at 45. There is no CVA tenderness. Lungs are clear without rales, wheezes, or rhonchi. Chest: sternotomy scar is present and healed well. There is moderate anterior chest wall tenderness diffusely. Heart has regular rate and rhythm without murmur. Abdomen is soft, flat, nontender without masses or hepatosplenomegaly and peristalsis is normoactive. Extremities have 2+ edema, full range of motion is present. Skin is warm and dry without rash. Neurologic: Mental status is normal, cranial nerves are intact, there are no motor or sensory deficits.  ED Treatments / Results  Labs (all labs ordered are listed, but only abnormal results are displayed) Labs Reviewed  BASIC METABOLIC PANEL - Abnormal; Notable for the following:       Result Value   Potassium 3.3 (*)    All other components within normal limits  CBC - Abnormal; Notable for the following:    RBC 5.75 (*)    MCV 64.2 (*)    MCH 21.2 (*)    RDW 18.1 (*)    All other components within normal limits  BRAIN NATRIURETIC PEPTIDE  I-STAT TROPONIN, ED  I-STAT TROPONIN, ED    EKG  EKG Interpretation  Date/Time:  Monday  June 02 2017 18:18:14 EDT Ventricular Rate:  95 PR Interval:  170 QRS Duration: 124 QT Interval:  390 QTC Calculation: 490 R Axis:   53 Text Interpretation:  Normal sinus rhythm Right bundle branch block Abnormal ECG When compared with ECG of 12/10/2016, Nonspecific T wave abnormality has improved Confirmed by Delora Fuel ([id number]) on 06/02/2017 11:35:35 PM       Radiology Dg Chest 2 View  Result Date: 06/02/2017 CLINICAL DATA:  Shortness of breath EXAM: CHEST  2 VIEW COMPARISON:  03/20/2017, 12/10/2016, CT chest 05/15/2016, 05/15/2016, 02/08/2016 FINDINGS: Right-sided central venous port tip overlies the cavoatrial junction. Post sternotomy changes. Stable scarring in the right lung base with adjacent pleural thickening. Mild increased interstitial opacity at the right base. No acute consolidation or effusion. Normal cardiomediastinal silhouette. No pneumothorax. IMPRESSION: 1. Scarring at the right lung base. Slight increased linear opacity at the right base could reflect superimposed atelectasis or mild interstitial inflammation. Electronically Signed   By: Donavan Foil M.D.   On: 06/02/2017 19:52    Procedures Procedures (including critical care time)  Medications Ordered in ED Medications  furosemide (LASIX) injection 40 mg (40 mg Intravenous Given 06/03/17 0426)  potassium chloride SA (K-DUR,KLOR-CON) CR tablet 40 mEq (40 mEq Oral Given 06/03/17 0427)  ketorolac (TORADOL) 30 MG/ML injection 30 mg (30 mg Intravenous Given 06/03/17 0427)     Initial Impression / Assessment and Plan / ED Course  I have reviewed the triage vital signs and the nursing notes.  Pertinent labs & imaging results that were available during my care of the patient were reviewed by me and considered in my medical decision making (see chart for details).  CHF exacerbation. Right arm pain appears to be musculoskeletal in our urgent. No indication for imaging at this point. Left lumbar pain is appears to be  radicular. No indication for imaging today. She will be given a dose of furosemide and ketorolac in the ED. Potassium is low, so she will be given a dose of K Dur. Old records reviewed, confirming management in  the CHF clinic area  She is feels better after ketorolac. Unfortunately, that cannot be continued because of her anticoagulated state. She is discharged with prescriptions for orphenadrine, K Dur, and, in all. Follow-up in the heart failure clinic today for reevaluation regarding her CHF exacerbation.  Final Clinical Impressions(s) / ED Diagnoses   Final diagnoses:  Acute on chronic congestive heart failure, unspecified heart failure type ([place])  Acute left-sided low back pain without sciatica  Hypokalemia  Pain in right arm    New Prescriptions New Prescriptions   No medications on file     Delora Fuel, MD 97/41/63 [phone]

## 2017-06-05 ENCOUNTER — Encounter (HOSPITAL_COMMUNITY)
Admission: RE | Admit: 2017-06-05 | Discharge: 2017-06-05 | Disposition: A | Discharge status: Home / Self Care | Payer: Medicare HMO | Source: Ambulatory Visit | Attending: Internal Medicine | Admitting: Internal Medicine

## 2017-06-05 VITALS — Wt 331.1 lb

## 2017-06-05 DIAGNOSIS — I272 Pulmonary hypertension, unspecified: Secondary | ICD-10-CM | POA: Diagnosis not present

## 2017-06-05 NOTE — Progress Notes (Signed)
Daily Session Note  Patient Details  Name: [name] [name] MRN: [id number] Date of Birth: 11-Jan-1961 Referring Provider:     Pulmonary Rehab Walk Test from 03/25/2017 in [place]  Referring Provider  Dr. Haroldine Laws      Encounter Date: 06/05/2017  Check In:     Session Check In - 06/05/17 1342      Check-In   Location [facility]   Staff Present Su Hilt, MS, ACSM RCEP, Exercise Physiologist;Portia Rollene Rotunda, RN, Roque Cash, RN   Supervising physician immediately available to respond to emergencies [facility] immediately available   Physician(s) Dr. Wendee Beavers   Medication changes reported     No   Fall or balance concerns reported    No   Tobacco Cessation No Change   Warm-up and Cool-down Performed as group-led instruction   Resistance Training Performed Yes   VAD Patient? No     Pain Assessment   Currently in Pain? No/denies   Multiple Pain Sites No      Capillary Blood Glucose: No results found for this or any previous visit (from the past 24 hour(s)).      Exercise Prescription Changes - 06/05/17 1500      Response to Exercise   Blood Pressure (Admit) 118/60   Blood Pressure (Exercise) 147/85   Blood Pressure (Exit) 108/70   Heart Rate (Admit) 90 bpm   Heart Rate (Exercise) 144 bpm   Heart Rate (Exit) 97 bpm   Oxygen Saturation (Admit) 98 %   Oxygen Saturation (Exercise) 90 %   Oxygen Saturation (Exit) 96 %   Rating of Perceived Exertion (Exercise) 13   Perceived Dyspnea (Exercise) 1   Duration Progress to 45 minutes of aerobic exercise without signs/symptoms of physical distress   Intensity THRR unchanged     Progression   Progression Continue to progress workloads to maintain intensity without signs/symptoms of physical distress.     Resistance Training   Training Prescription Yes   Weight orange bands   Reps 10-15   Time 10 Minutes     NuStep   Level 3   Minutes 17   METs 2     Track   Laps 13   Minutes 17      History  Smoking Status  . Never Smoker  Smokeless Tobacco  . Never Used    Goals Met:  Exercise tolerated well No report of cardiac concerns or symptoms Strength training completed today  Goals Unmet:  Not Applicable  Comments: Service time is from 1330 to 1505    Dr. Rush Farmer is Medical Director for Pulmonary Rehab at [facility].

## 2017-06-06 ENCOUNTER — Encounter: Payer: Self-pay | Admitting: Internal Medicine

## 2017-06-06 ENCOUNTER — Ambulatory Visit: Payer: Medicare HMO | Attending: Internal Medicine | Admitting: Internal Medicine

## 2017-06-06 VITALS — BP 119/81 | HR 80 | Temp 98.6°F | Resp 16 | Wt 329.6 lb

## 2017-06-06 DIAGNOSIS — G894 Chronic pain syndrome: Secondary | ICD-10-CM | POA: Insufficient documentation

## 2017-06-06 DIAGNOSIS — M5442 Lumbago with sciatica, left side: Secondary | ICD-10-CM

## 2017-06-06 DIAGNOSIS — M797 Fibromyalgia: Secondary | ICD-10-CM | POA: Diagnosis not present

## 2017-06-06 DIAGNOSIS — G4733 Obstructive sleep apnea (adult) (pediatric): Secondary | ICD-10-CM | POA: Diagnosis not present

## 2017-06-06 DIAGNOSIS — Z23 Encounter for immunization: Secondary | ICD-10-CM

## 2017-06-06 DIAGNOSIS — I10 Essential (primary) hypertension: Secondary | ICD-10-CM | POA: Insufficient documentation

## 2017-06-06 DIAGNOSIS — F4321 Adjustment disorder with depressed mood: Secondary | ICD-10-CM | POA: Insufficient documentation

## 2017-06-06 DIAGNOSIS — R490 Dysphonia: Secondary | ICD-10-CM

## 2017-06-06 DIAGNOSIS — F329 Major depressive disorder, single episode, unspecified: Secondary | ICD-10-CM | POA: Insufficient documentation

## 2017-06-06 DIAGNOSIS — R05 Cough: Secondary | ICD-10-CM | POA: Insufficient documentation

## 2017-06-06 DIAGNOSIS — M069 Rheumatoid arthritis, unspecified: Secondary | ICD-10-CM | POA: Diagnosis not present

## 2017-06-06 DIAGNOSIS — F411 Generalized anxiety disorder: Secondary | ICD-10-CM | POA: Insufficient documentation

## 2017-06-06 DIAGNOSIS — I69351 Hemiplegia and hemiparesis following cerebral infarction affecting right dominant side: Secondary | ICD-10-CM | POA: Insufficient documentation

## 2017-06-06 DIAGNOSIS — F419 Anxiety disorder, unspecified: Secondary | ICD-10-CM | POA: Diagnosis present

## 2017-06-06 DIAGNOSIS — Z86711 Personal history of pulmonary embolism: Secondary | ICD-10-CM | POA: Diagnosis not present

## 2017-06-06 LAB — POCT URINALYSIS DIPSTICK
Bilirubin, UA: NEGATIVE
Glucose, UA: NEGATIVE
Ketones, UA: NEGATIVE
Nitrite, UA: NEGATIVE
Protein, UA: NEGATIVE
Spec Grav, UA: 1.01 (ref 1.010–1.025)
Urobilinogen, UA: 0.2 E.U./dL
pH, UA: 5.5 (ref 5.0–8.0)

## 2017-06-06 MED ORDER — CYCLOBENZAPRINE HCL 5 MG PO TABS: 5.0000 mg | ORAL_TABLET | Freq: Three times a day (TID) | ORAL | 1 refills | Status: DC | PRN

## 2017-06-06 MED ORDER — PREDNISONE 10 MG PO TABS: ORAL_TABLET | ORAL | 0 refills | Status: DC

## 2017-06-06 NOTE — Progress Notes (Signed)
Patient ID: [name] [name], female    DOB: [birth year]  MRN: [id number]  CC: Follow-up (ED) and Anxiety   Subjective: [name] [name] is a 56 y.o. 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 Cough: Refer to pulmonary on last visit. She has seen them and an allergy panel has been ordered. Advanced imaging of the sinuses were negative. Given a course of prednisone -Cough better but has persistent hoarseness for several mths. Nonsmoker  2. RA: appt is scheduled for November at [facility]  3. Seen in ER 3 days ago for pain LT side and lower back radiating to buttock x 7 days and RT arm.  No dysuria. -Told she probably has a pinched nerve and given Toradol shot. Discharged on muscle relaxant -Today pain is rated at  6-7/10. She points to the left flank extending to mid left buttock and down the side of the left thigh -"ever now and then pain shoots down to foot and foot gets numb" -no initiating factors.   Patient Active Problem List   Diagnosis Date Noted  . Chronic cough 04/10/2017  . Rheumatoid arthritis involving multiple sites ([place]) 04/10/2017  . Anxiety and depression 04/10/2017  . Adjustment disorder with depressed mood 12/11/2016  . Acute upper respiratory infection 12/11/2016  . GAD (generalized anxiety disorder) 08/14/2016  . Morbid obesity ([place]) 08/14/2016  . Stroke ([place]) 06/27/2016  . Right sided weakness 06/27/2016  . Essential hypertension 06/27/2016  . Atypical chest pain 06/27/2016  . Difficult airway 03/29/2016  . 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 OSA on CPAP 02/20/2016  . Chronic pain syndrome 01/31/2016  . Vaginal  bleeding 11/20/2015  . Hematochezia 11/20/2015  . Pulmonary embolus ([place]) 11/15/2015  . Vertigo 11/15/2015  . Depression 11/15/2015     Current Outpatient Prescriptions on File Prior to Visit  Medication Sig Dispense Refill  . acetaminophen-codeine (TYLENOL #3) 300-30 MG tablet Take 1 tablet by mouth every 8 (eight) hours as needed for moderate pain. Max total Tyelnol < 2000mg  /day 50 tablet 2  . albuterol (PROVENTIL HFA;VENTOLIN HFA) 108 (90 Base) MCG/ACT inhaler Inhale 1-2 puffs into the lungs every 6 (six) hours as needed for wheezing or shortness of breath. 6.7 g 5  . atorvastatin (LIPITOR) 20 MG tablet Take 1 tablet (20 mg total) by mouth daily at 6 PM. 90 tablet 2  . docusate sodium (COLACE) 100 MG capsule Take 1 capsule (100 mg total) by mouth every 12 (twelve) hours. 30 capsule 0  . ferrous sulfate (FERROUSUL) 325 (65 FE) MG tablet Take 1 tablet (325 mg total) by mouth daily with breakfast. 90 tablet 3  . fluticasone (FLONASE) 50 MCG/ACT nasal spray Place 2 sprays into both nostrils daily. 16 g 6  . furosemide (LASIX) 40 MG tablet Take 1 tablet (40 mg total) by mouth daily. Take extra 40 mg tablet once in the afternoon AS NEEDED for weight gain 3 lbs or more. 60 tablet 3  . gabapentin (NEURONTIN) 300 MG capsule Take 1 capsule (300 mg total) by mouth 3 (three) times daily. 90 capsule 1  . loratadine (CLARITIN) 10 MG tablet Take 1 tablet (10 mg total) by mouth daily. 30 tablet 11  .  meclizine (ANTIVERT) 25 MG tablet TAKE ONE TABLET BY MOUTH THREE TIMES DAILY AS NEEDED FOR  DIZZINESS 60 tablet 0  . orphenadrine (NORFLEX) 100 MG tablet Take 1 tablet (100 mg total) by mouth 2 (two) times daily. 30 tablet 0  . potassium chloride (K-DUR) 10 MEQ tablet TAKE 1 TABLET BY MOUTH TWICE DAILY (Patient not taking: Reported on 06/03/2017) 180 tablet 1  . potassium chloride SA (K-DUR,KLOR-CON) 20 MEQ tablet Take 1 tablet (20 mEq total) by mouth 2 (two) times daily. 10 tablet 0  . Riociguat (ADEMPAS) 2 MG  TABS Take 2 mg by mouth 3 (three) times daily.    . rivaroxaban (XARELTO) 20 MG TABS tablet Take 1 tablet (20 mg total) by mouth daily with supper. 90 tablet 3  . traMADol (ULTRAM) 50 MG tablet Take 1 tablet (50 mg total) by mouth every 6 (six) hours as needed. 15 tablet 0   No current facility-administered medications on file prior to visit.     No Known Allergies  Social History   Social History  . Marital status: Married    Spouse name: N/A  . Number of children: N/A  . Years of education: N/A   Occupational History  . Not on file.   Social History Main Topics  . Smoking status: Never Smoker  . Smokeless tobacco: Never Used  . Alcohol use No  . Drug use: No  . Sexual activity: Yes    Partners: Male    Birth control/ protection: Surgical   Other Topics Concern  . Not on file   Social History Narrative  . No narrative on file    Family History  Problem Relation Age of Onset  . Lung cancer Maternal Grandmother   . Dementia Mother   . Dementia Father   . Anxiety disorder Sister   . Bipolar disorder Sister   . Drug abuse Sister   . Sexual abuse Maternal Aunt   . Anxiety disorder Cousin   . Anxiety disorder Sister   . Bipolar disorder Sister   . Drug abuse Sister   . Breast cancer Neg Hx     Past Surgical History:  Procedure Laterality Date  . ABDOMINAL HYSTERECTOMY    . BREAST REDUCTION SURGERY    . CHOLECYSTECTOMY    . EMBOLECTOMY N/A    pulmonary embolectomy  . REDUCTION MAMMAPLASTY Bilateral   . RIGHT HEART CATH N/A 10/28/2016   Procedure: Right Heart Cath;  Surgeon: Jolaine Artist, MD;  Location: [facility];  Service: Cardiovascular;  Laterality: N/A;    ROS: Review of Systems Negative except as stated above PHYSICAL EXAM: BP 119/81   Pulse 80   Temp 98.6 F (37 C) (Oral)   Resp 16   Wt (!) 329 lb 9.6 oz (149.5 kg)   SpO2 95%   BMI 58.39 kg/m   Wt Readings from Last 3 Encounters:  06/06/17 (!) 329 lb 9.6 oz (149.5 kg)    06/05/17 (!) 331 lb 2.1 oz (150.2 kg)  06/03/17 (!) 326 lb 6.4 oz (148.1 kg)   Physical Exam  General appearance - alert, well appearing, and in no distress Mental status - alert, oriented to person, place, and time, normal mood, behavior, speech, dress, motor activity, and thought processes Throat is clear Chest - clear to auscultation, no wheezes, rales or rhonchi, symmetric air entry. Audible hoarseness when she speaks Heart - normal rate, regular rhythm, normal S1, S2, no murmurs, rubs, clicks or gallops Neurological -mild subjective decreased  sensation to gross touch left lower leg Musculoskeletal - no bruising noted on the flank or buttock. Mild to moderate tenderness on palpation of left lower thoracic into the lumbar paraspinal muscles. Mild to moderate discomfort with straight leg raise  Urine dip today:  Results for orders placed or performed in visit on 06/06/17  Urinalysis Dipstick  Result Value Ref Range   Color, UA yellow    Clarity, UA cloudy    Glucose, UA negative    Bilirubin, UA negative    Ketones, UA negative    Spec Grav, UA 1.010 1.010 - 1.025   Blood, UA trace-intact    pH, UA 5.5 5.0 - 8.0   Protein, UA negative    Urobilinogen, UA 0.2 0.2 or 1.0 E.U./dL   Nitrite, UA negative    Leukocytes, UA Small (1+) (A) Negative    ASSESSMENT AND PLAN: 1. Acute left-sided low back pain with left-sided sciatica -will give trail of Prednisone taper with Flexeril. D/c muscle relaxant given in ER. Call or f/u next wk if no improvement for advance imaging - Urinalysis Dipstick - predniSONE (DELTASONE) 10 MG tablet; 4 tabs daily x 1 day then 3 tabs daily x 2 days then 2 tabs x 2 days then 1 tab x 2 days  Dispense: 16 tablet; Refill: 0 - cyclobenzaprine (FLEXERIL) 5 MG tablet; Take 1 tablet (5 mg total) by mouth 3 (three) times daily as needed for muscle spasms.  Dispense: 30 tablet; Refill: 1  2. Hoarseness ENT referral - TSH; Future  3. Immunization due - Flu  Vaccine QUAD 6+ mos PF IM (Fluarix Quad PF)  Patient was given the opportunity to ask questions.  Patient verbalized understanding of the plan and was able to repeat key elements of the plan.   Orders Placed This Encounter  Procedures  . Flu Vaccine QUAD 6+ mos PF IM (Fluarix Quad PF)  . TSH  . Urinalysis Dipstick     Requested Prescriptions   Signed Prescriptions Disp Refills  . predniSONE (DELTASONE) 10 MG tablet 16 tablet 0    Sig: 4 tabs daily x 1 day then 3 tabs daily x 2 days then 2 tabs x 2 days then 1 tab x 2 days  . cyclobenzaprine (FLEXERIL) 5 MG tablet 30 tablet 1    Sig: Take 1 tablet (5 mg total) by mouth 3 (three) times daily as needed for muscle spasms.    Return in about 2 months (around 08/06/2017).  Karle Plumber, MD, FACP

## 2017-06-06 NOTE — Patient Instructions (Signed)

## 2017-06-09 ENCOUNTER — Ambulatory Visit: Payer: Self-pay | Admitting: Internal Medicine

## 2017-06-09 ENCOUNTER — Telehealth: Payer: Self-pay | Admitting: Internal Medicine

## 2017-06-09 NOTE — Telephone Encounter (Signed)
Pt called requesting to speak to PCP, pt states she is still not feeling well and would like to request MRI. Please f/up

## 2017-06-10 ENCOUNTER — Other Ambulatory Visit (HOSPITAL_COMMUNITY): Payer: Self-pay

## 2017-06-10 ENCOUNTER — Telehealth: Payer: Self-pay

## 2017-06-10 ENCOUNTER — Telehealth (HOSPITAL_COMMUNITY): Payer: Self-pay

## 2017-06-10 ENCOUNTER — Inpatient Hospital Stay (HOSPITAL_COMMUNITY): Admission: RE | Admit: 2017-06-10 | Payer: Self-pay | Source: Ambulatory Visit

## 2017-06-10 ENCOUNTER — Encounter (HOSPITAL_COMMUNITY): Payer: Medicare HMO

## 2017-06-10 DIAGNOSIS — M5416 Radiculopathy, lumbar region: Secondary | ICD-10-CM

## 2017-06-10 NOTE — Telephone Encounter (Signed)
Pt called and states that her back pain has gotten worse. Pt states that she would like to have the MRI, but patients states that medication is not helping.

## 2017-06-10 NOTE — Telephone Encounter (Signed)
[facility] Clinic appointment reminder call placed to patient for upcoming post-hospital follow up.  Does understand purpose of this appointment and where CHF Clinic is located? Patient was aware of appointment but states she is not able to come.  Seems that this caller woke patient up in calling to remind her of apt as it took her awhile to engage in conversation and realize what time it was this morning.  Patient asked to cancel apt and she would call back to reschedule.  How is patient feeling? Well, no complaints  Does patient have all of their medications since their recent discharge? Yes  Patient also reminded to take all medications as prescribed on the day of his/her appointment and to bring all medications to this appointment.  Advised to call our office for tardiness or cancellations/rescheduling needs.  Leory Plowman, Guinevere Ferrari

## 2017-06-10 NOTE — Progress Notes (Signed)
Paramedicine Encounter    Patient ID: [name] [name], female    DOB: [birth year], 56 y.o.   MRN: [id number]   Patient Care Team: Ladell Pier, MD as PCP - General (Internal Medicine) Loletta Specter, RN as [place] Management  Patient Active Problem List   Diagnosis Date Noted  . Chronic cough 04/10/2017  . Rheumatoid arthritis involving multiple sites ([place]) 04/10/2017  . Anxiety and depression 04/10/2017  . Adjustment disorder with depressed mood 12/11/2016  . Acute upper respiratory infection 12/11/2016  . GAD (generalized anxiety disorder) 08/14/2016  . Morbid obesity ([place]) 08/14/2016  . Stroke ([place]) 06/27/2016  . Right sided weakness 06/27/2016  . Essential hypertension 06/27/2016  . Atypical chest pain 06/27/2016  . Difficult airway 03/29/2016  . 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 OSA on CPAP 02/20/2016  . Chronic pain syndrome 01/31/2016  . Vaginal bleeding 11/20/2015  . Hematochezia 11/20/2015  . Pulmonary embolus ([place]) 11/15/2015  . Vertigo 11/15/2015  . Depression 11/15/2015    Current Outpatient Prescriptions:  .  acetaminophen-codeine (TYLENOL #3) 300-30 MG tablet, Take 1 tablet by mouth every 8 (eight) hours as needed for moderate pain. Max total Tyelnol < 2000mg  /day, Disp: 50 tablet, Rfl: 2 .  albuterol (PROVENTIL HFA;VENTOLIN HFA) 108 (90 Base) MCG/ACT inhaler, Inhale 1-2 puffs into the lungs every 6 (six) hours as needed for wheezing or shortness of breath., Disp: 6.7 g, Rfl: 5 .  atorvastatin (LIPITOR) 20 MG tablet, Take 1 tablet (20 mg total) by mouth daily at 6 PM., Disp: 90 tablet, Rfl: 2 .  cyclobenzaprine (FLEXERIL) 5 MG tablet, Take 1 tablet (5 mg total) by mouth 3 (three) times daily as needed for muscle spasms., Disp: 30 tablet, Rfl: 1 .  docusate sodium (COLACE) 100 MG capsule, Take 1 capsule (100 mg total) by mouth every 12 (twelve) hours., Disp: 30 capsule, Rfl: 0 .  ferrous  sulfate (FERROUSUL) 325 (65 FE) MG tablet, Take 1 tablet (325 mg total) by mouth daily with breakfast., Disp: 90 tablet, Rfl: 3 .  fluticasone (FLONASE) 50 MCG/ACT nasal spray, Place 2 sprays into both nostrils daily., Disp: 16 g, Rfl: 6 .  furosemide (LASIX) 40 MG tablet, Take 1 tablet (40 mg total) by mouth daily. Take extra 40 mg tablet once in the afternoon AS NEEDED for weight gain 3 lbs or more., Disp: 60 tablet, Rfl: 3 .  gabapentin (NEURONTIN) 300 MG capsule, Take 1 capsule (300 mg total) by mouth 3 (three) times daily., Disp: 90 capsule, Rfl: 1 .  loratadine (CLARITIN) 10 MG tablet, Take 1 tablet (10 mg total) by mouth daily., Disp: 30 tablet, Rfl: 11 .  meclizine (ANTIVERT) 25 MG tablet, TAKE ONE TABLET BY MOUTH THREE TIMES DAILY AS NEEDED FOR  DIZZINESS, Disp: 60 tablet, Rfl: 0 .  potassium chloride SA (K-DUR,KLOR-CON) 20 MEQ tablet, Take 1 tablet (20 mEq total) by mouth 2 (two) times daily., Disp: 10 tablet, Rfl: 0 .  predniSONE (DELTASONE) 10 MG tablet, 4 tabs daily x 1 day then 3 tabs daily x 2 days then 2 tabs x 2 days then 1 tab x 2 days, Disp: 16 tablet, Rfl: 0 .  Riociguat (ADEMPAS) 2 MG TABS, Take 2 mg by mouth 3 (three) times daily., Disp: , Rfl:  .  rivaroxaban (XARELTO) 20 MG TABS tablet, Take 1 tablet (20 mg total) by mouth daily with supper., Disp: 90 tablet, Rfl: 3 .  traMADol (ULTRAM) 50 MG tablet, Take 1 tablet (  50 mg total) by mouth every 6 (six) hours as needed., Disp: 15 tablet, Rfl: 0 .  orphenadrine (NORFLEX) 100 MG tablet, Take 1 tablet (100 mg total) by mouth 2 (two) times daily. (Patient not taking: Reported on 06/10/2017), Disp: 30 tablet, Rfl: 0 .  potassium chloride (K-DUR) 10 MEQ tablet, TAKE 1 TABLET BY MOUTH TWICE DAILY (Patient not taking: Reported on 06/10/2017), Disp: 180 tablet, Rfl: 1 No Known Allergies   Social History   Social History  . Marital status: Married    Spouse name: N/A  . Number of children: N/A  . Years of education: N/A    Occupational History  . Not on file.   Social History Main Topics  . Smoking status: Never Smoker  . Smokeless tobacco: Never Used  . Alcohol use No  . Drug use: No  . Sexual activity: Yes    Partners: Male    Birth control/ protection: Surgical   Other Topics Concern  . Not on file   Social History Narrative  . No narrative on file    Physical Exam  Constitutional: She is oriented to person, place, and time.  Cardiovascular: Normal rate and regular rhythm.   Pulmonary/Chest: Effort normal and breath sounds normal. No respiratory distress. She has no wheezes. She has no rales.  Abdominal: Soft.  Musculoskeletal: Normal range of motion.  Neurological: She is alert and oriented to person, place, and time.  Skin: Skin is warm and dry.  Psychiatric: She has a normal mood and affect.        SAFE - 04/15/17 1600      Situation   Admitting diagnosis chf   Heart failure history Exisiting   Comorbidities HTN;Hx DVT/PE;Sleep Apnea   Readmitted within 30 days No   Hospital admission within past 12 months Yes   number of hospital admissions 1   number of ED visits 4     Assessment   Lives alone No   Primary support person Husband   Mode of transportation personal car;scat;family/friends   Other services involved Other   Home equipement Cane;Scale;Walker     Weight   Weighs self daily Yes   Scale provided No   Records on weight chart Yes     Resources   Has "Living better w/heart failure" book Yes   Has HF Zone tool Yes   Able to identify yellow zone signs/when to call MD Yes   Records zone daily No     Medications   Uses a pill box Yes   Who stocks the pill box Paramedicine   Pill box checked this visit Yes   Pill box refilled this visit Yes   Difficulty obtaining medications Yes   Barriers to medication Financial   Community Paramedic obtains medications from pharmacy No   Mail order medications Yes   New medications at home today No   Which ones  [place]   Next scheduled delivery of medications 05/09/17   Missed one or more doses of medications per week No     Nutrition   Patient receives meals on wheels No   Patient follows low sodium diet Yes   Has foods at home that meet the current recommended diet Yes   Patient follows low sugar/card diet No   Nutritional concerns/issues Affordability     Activity Level   ADL's/Mobility Independent   How many feet can patient ambulate 20 feet   Typical activity level Inactive     Urine   Difficulty urinating  No   Changes in urine None     Time spent with patient   Time spent with patient  90 Minutes        Future Appointments Date Time Provider [place]  06/12/2017 10:30 AM [facility]  06/12/2017 1:30 PM [facility] UNDERGRAD [facility] None  06/17/2017 1:30 PM [facility] REH[facility] UNDERGRAD [facility] None  06/19/2017 1:30 PM [facility] REH[facility] UNDERGRAD [facility] None  06/20/2017 9:00 AM [facility] ROOM [facility] None  06/24/2017 1:30 PM [facility] REH[facility] UNDERGRAD [facility] None  06/26/2017 1:30 PM [facility]UL[facility] REHAB UNDERGRAD [facility] None  07/23/2017 9:00 AM [facility] ROOM [facility] None  08/08/2017 11:15 AM Ladell Pier, MD [facility] None  08/22/2017 9:00 AM [facility] ROOM [facility] None   BP 110/72 (BP Location: Left Arm, Patient Position: Sitting, Cuff Size: Large)   Pulse 90   Resp 16   Wt (!) 325 lb 6.4 oz (147.6 kg)   SpO2 96%   BMI 57.64 kg/m  Weight yesterday- Did not weigh Last visit weight- 326.4 lb  [name] [name] was seen at home today. She reports persistent flank pain which radiates into her low back and leg. She is seeing her PCP about this and is awaiting an MRI. She missed two days worth of morning medications and says she is dealing with a lot of familial issues which is why she forgot to take the medications. I have spoken with Darnelle Bos and she is going to order her a shower chair for [name] [name] and the contractor has been  contacted and said he will install the bars on Thursday evening. I have instructed [name] Depaolis to contact me when the contractor shows up and to let me know if he gives her any trouble.   [name] [name], EMT 06/10/17  ACTION: Home visit completed Next visit planned for 1 week

## 2017-06-11 NOTE — Telephone Encounter (Signed)
Will forward to pcp

## 2017-06-12 ENCOUNTER — Ambulatory Visit (INDEPENDENT_AMBULATORY_CARE_PROVIDER_SITE_OTHER): Payer: Medicare HMO

## 2017-06-12 ENCOUNTER — Encounter (HOSPITAL_COMMUNITY): Payer: Medicare HMO

## 2017-06-12 ENCOUNTER — Telehealth: Payer: Self-pay | Admitting: Licensed Clinical Social Worker

## 2017-06-12 DIAGNOSIS — R002 Palpitations: Secondary | ICD-10-CM

## 2017-06-12 NOTE — Telephone Encounter (Signed)
CSW contacted patient to follow up on grab bars and if contractor has made contact. Message left for return call. Raquel Sarna, [place], [place]

## 2017-06-12 NOTE — Addendum Note (Signed)
Addended by: Karle Plumber B on: 06/12/2017 09:14 PM   Modules accepted: Orders

## 2017-06-13 ENCOUNTER — Ambulatory Visit: Payer: Medicare HMO | Attending: Internal Medicine | Admitting: Internal Medicine

## 2017-06-13 ENCOUNTER — Other Ambulatory Visit: Payer: Self-pay

## 2017-06-13 ENCOUNTER — Telehealth (HOSPITAL_COMMUNITY): Payer: Self-pay

## 2017-06-13 VITALS — BP 124/78 | HR 80 | Temp 98.2°F | Resp 16 | Wt 327.6 lb

## 2017-06-13 DIAGNOSIS — I110 Hypertensive heart disease with heart failure: Secondary | ICD-10-CM | POA: Insufficient documentation

## 2017-06-13 DIAGNOSIS — M545 Low back pain: Secondary | ICD-10-CM | POA: Diagnosis not present

## 2017-06-13 DIAGNOSIS — I509 Heart failure, unspecified: Secondary | ICD-10-CM | POA: Insufficient documentation

## 2017-06-13 DIAGNOSIS — R202 Paresthesia of skin: Secondary | ICD-10-CM | POA: Insufficient documentation

## 2017-06-13 DIAGNOSIS — E785 Hyperlipidemia, unspecified: Secondary | ICD-10-CM | POA: Insufficient documentation

## 2017-06-13 DIAGNOSIS — Z7901 Long term (current) use of anticoagulants: Secondary | ICD-10-CM | POA: Diagnosis not present

## 2017-06-13 DIAGNOSIS — M5416 Radiculopathy, lumbar region: Secondary | ICD-10-CM | POA: Insufficient documentation

## 2017-06-13 DIAGNOSIS — F329 Major depressive disorder, single episode, unspecified: Secondary | ICD-10-CM | POA: Insufficient documentation

## 2017-06-13 DIAGNOSIS — R079 Chest pain, unspecified: Secondary | ICD-10-CM | POA: Diagnosis not present

## 2017-06-13 DIAGNOSIS — M1990 Unspecified osteoarthritis, unspecified site: Secondary | ICD-10-CM | POA: Diagnosis not present

## 2017-06-13 DIAGNOSIS — E669 Obesity, unspecified: Secondary | ICD-10-CM | POA: Diagnosis not present

## 2017-06-13 DIAGNOSIS — M79605 Pain in left leg: Secondary | ICD-10-CM | POA: Insufficient documentation

## 2017-06-13 DIAGNOSIS — Z86711 Personal history of pulmonary embolism: Secondary | ICD-10-CM | POA: Insufficient documentation

## 2017-06-13 MED ORDER — OMEPRAZOLE 20 MG PO CPDR: 20.0000 mg | DELAYED_RELEASE_CAPSULE | Freq: Every day | ORAL | 3 refills | Status: DC

## 2017-06-13 MED ORDER — GABAPENTIN 300 MG PO CAPS: 600.0000 mg | ORAL_CAPSULE | Freq: Three times a day (TID) | ORAL | 3 refills | Status: DC

## 2017-06-13 NOTE — Telephone Encounter (Signed)
I spoke to [name] [name] on the phone today regarding grab bars which were supposed to be installed in her bathroom. The contractor has not showed up to the house on several occasions when he said he would despite taking payment. He was I spoke with [name] [name] and [name] [name] regarding this and we communally agreed that the best course of action was to ask for a return of funds and end our dealings with him. [name] [name] was told this same thing and that [name] [name] would contact her next week to arrange to have the work done.

## 2017-06-13 NOTE — Patient Instructions (Signed)
Increase Gabapentin to 600 mg three times a day. We have ordered an MRI of your back.    Start Omeprazole daily.   Try to get in with your cardiologist as soon as possible.  If any worsening of chest pains, you should be seen in ER.

## 2017-06-13 NOTE — Progress Notes (Signed)
Subjective:  Patient ID: [name] [name], female    DOB: 1961/07/01  Age: 56 y.o. MRN: [id number]  CC: Back Pain   HPI [name] [name] is a 56 y/o [place] American female who present in clinic for recurrent lumbar and LLE pain. Patient has PMHx of HTN, HLD, Obesity, Arthritis, Depression, Pulmonary Embolism, and CHF with an EF 55-60% from Echo 04/22/17.   Lumbar/LLE Pain: Patient reports a constant lower back pain radiating to her L hip and down her L leg, at 9/10. She describes the pain as a constant burning, fire-like sensation. She is unable to sleep through the night d/t the pain. The pain is present throughout the day and worsened with minimal movement. Reports paresthesia down LLE, with the increased numbness noted in the toes. Reports taking Tylenol #3, Gabapentin, Flexeril, and completion of Prednisone. Denies relief from the medication regimen; unable to complete Pulmonary Rehab d/t the pain. Denies recent falls, near falls, or injury. Denies loss of bowels or bladder. Denies dysuria or hematuria. Denies fever, chills, increased joint swelling/warmth/redness.   Chest Discomfort: Patient reports 1 week duration of mid-sternal, non-radiating, chest discomfort; described as a burning sensation. The pain is constant, not worsened by foods or with exertion. Per patient, the pain is lessened when she ambulates and "moves around". Reports intermittent nausea with the pain and loss of appetite. Denies increased SOB, palpitations, dizziness, or near syncope. She states she attempted taking a Tums x 1, with no relief. Patient declined taking additional antiacids for the concern for drug interaction with Adempas. Patient is followed by cardiology, with recent heart monitoring order on 06/12/17. Per patient, the Cardiologist reported concerns for an additional heart catheterization in the future.   Past Medical History:  Diagnosis Date  . Arthritis   . Depression   . Diverticulitis   . Hyperlipidemia     . Hypertension   . Obesity   . Pneumonia   . Pulmonary embolism [facility])    Past Surgical History:  Procedure Laterality Date  . ABDOMINAL HYSTERECTOMY    . BREAST REDUCTION SURGERY    . CHOLECYSTECTOMY    . EMBOLECTOMY N/A    pulmonary embolectomy  . REDUCTION MAMMAPLASTY Bilateral   . RIGHT HEART CATH N/A 10/28/2016   Procedure: Right Heart Cath;  Surgeon: Jolaine Artist, MD;  Location: [place] [facility];  Service: Cardiovascular;  Laterality: N/A;   Outpatient Medications Prior to Visit  Medication Sig Dispense Refill  . acetaminophen-codeine (TYLENOL #3) 300-30 MG tablet Take 1 tablet by mouth every 8 (eight) hours as needed for moderate pain. Max total Tyelnol < 2000mg  /day 50 tablet 2  . albuterol (PROVENTIL HFA;VENTOLIN HFA) 108 (90 Base) MCG/ACT inhaler Inhale 1-2 puffs into the lungs every 6 (six) hours as needed for wheezing or shortness of breath. 6.7 g 5  . atorvastatin (LIPITOR) 20 MG tablet Take 1 tablet (20 mg total) by mouth daily at 6 PM. 90 tablet 2  . cyclobenzaprine (FLEXERIL) 5 MG tablet Take 1 tablet (5 mg total) by mouth 3 (three) times daily as needed for muscle spasms. 30 tablet 1  . docusate sodium (COLACE) 100 MG capsule Take 1 capsule (100 mg total) by mouth every 12 (twelve) hours. 30 capsule 0  . ferrous sulfate (FERROUSUL) 325 (65 FE) MG tablet Take 1 tablet (325 mg total) by mouth daily with breakfast. 90 tablet 3  . fluticasone (FLONASE) 50 MCG/ACT nasal spray Place 2 sprays into both nostrils daily. 16 g 6  . furosemide (  LASIX) 40 MG tablet Take 1 tablet (40 mg total) by mouth daily. Take extra 40 mg tablet once in the afternoon AS NEEDED for weight gain 3 lbs or more. 60 tablet 3  . loratadine (CLARITIN) 10 MG tablet Take 1 tablet (10 mg total) by mouth daily. 30 tablet 11  . meclizine (ANTIVERT) 25 MG tablet TAKE ONE TABLET BY MOUTH THREE TIMES DAILY AS NEEDED FOR  DIZZINESS 60 tablet 0  . potassium chloride (K-DUR) 10 MEQ tablet TAKE 1 TABLET BY  MOUTH TWICE DAILY (Patient not taking: Reported on 06/10/2017) 180 tablet 1  . potassium chloride SA (K-DUR,KLOR-CON) 20 MEQ tablet Take 1 tablet (20 mEq total) by mouth 2 (two) times daily. 10 tablet 0  . Riociguat (ADEMPAS) 2 MG TABS Take 2 mg by mouth 3 (three) times daily.    . rivaroxaban (XARELTO) 20 MG TABS tablet Take 1 tablet (20 mg total) by mouth daily with supper. 90 tablet 3  . traMADol (ULTRAM) 50 MG tablet Take 1 tablet (50 mg total) by mouth every 6 (six) hours as needed. 15 tablet 0  . gabapentin (NEURONTIN) 300 MG capsule Take 1 capsule (300 mg total) by mouth 3 (three) times daily. 90 capsule 1  . orphenadrine (NORFLEX) 100 MG tablet Take 1 tablet (100 mg total) by mouth 2 (two) times daily. (Patient not taking: Reported on 06/10/2017) 30 tablet 0  . predniSONE (DELTASONE) 10 MG tablet 4 tabs daily x 1 day then 3 tabs daily x 2 days then 2 tabs x 2 days then 1 tab x 2 days 16 tablet 0   No facility-administered medications prior to visit.    No Known Allergies  ROS Review of Systems  Constitutional: Positive for activity change. Negative for chills, fatigue, fever and unexpected weight change.  Respiratory: Negative for chest tightness and shortness of breath.   Cardiovascular:       See HPI  Gastrointestinal: Negative for abdominal distention, abdominal pain and vomiting.  Genitourinary: Negative for difficulty urinating, dysuria, flank pain, frequency, hematuria and urgency.  Musculoskeletal: Negative for gait problem, joint swelling and myalgias.       See HPI.  Neurological: Negative for dizziness, syncope and weakness.       See HPI  Psychiatric/Behavioral: Negative for agitation, behavioral problems and dysphoric mood.  All other systems reviewed and are negative.  Objective:  BP 124/78   Pulse 80   Temp 98.2 F (36.8 C) (Oral)   Resp 16   Wt (!) 327 lb 9.6 oz (148.6 kg)   SpO2 96%   BMI 58.03 kg/m   BP/Weight 06/13/2017 06/10/2017 0/34/7425  Systolic BP  956 387 564  Diastolic BP 78 72 81  Wt. (Lbs) 327.6 325.4 329.6  BMI 58.03 57.64 58.39  Some encounter information is confidential and restricted. Go to Review Flowsheets activity to see all data.     Physical Exam  Constitutional: She is oriented to person, place, and time. She appears well-developed and well-nourished. She appears distressed (from acute pain).  HENT:  Head: Normocephalic and atraumatic.  Eyes: Pupils are equal, round, and reactive to light. Conjunctivae and EOM are normal.  Neck: Normal range of motion. Neck supple. No JVD present. Carotid bruit is not present.  Cardiovascular: Normal rate, regular rhythm, S1 normal and S2 normal.   Pulses:      Radial pulses are 2+ on the right side, and 2+ on the left side.       Dorsalis pedis pulses are 2+  on the right side, and 2+ on the left side.  Pulmonary/Chest: Effort normal and breath sounds normal. No respiratory distress. She has no wheezes.  Non-reproducible chest pain with palpitation  Abdominal: Soft. Bowel sounds are normal. She exhibits no distension. There is no tenderness.  Musculoskeletal: Normal range of motion. She exhibits tenderness (Mid-Lumbar and L hip). She exhibits no deformity.       Left hip: She exhibits tenderness. She exhibits normal range of motion, normal strength and no deformity.       Lumbar back: She exhibits tenderness. She exhibits normal range of motion.  Neurological: She is alert and oriented to person, place, and time.  Skin: Skin is warm and dry. No rash noted. No erythema.  Psychiatric: She has a normal mood and affect. Her behavior is normal. Judgment and thought content normal.  Nursing note and vitals reviewed. ECG performed- noted normal sinus rhythm with R bundle branch block.   Assessment & Plan:   1. Lumbar radiculopathy, Uncontrolled - Reports increased pain with distal paresthesia down LLE.  - MRI ordered of Lumbar spine  - Instructed to continue medication regimen.  Increased Gabapentin from 300 mg TID to 600 mg TID. - Instructed to f/u if symptoms worsened.  - gabapentin (NEURONTIN) 300 MG capsule; Take 2 capsules (600 mg total) by mouth 3 (three) times daily.  Dispense: 180 capsule; Refill: 3  2. Chest pain, unspecified type, acute - Reports constant mid-sternal, non-radiating pain. Patient with significant cardiac risk; with atypical chest pain relieved with ambulation, ordered ECG.  - ECG negative for acute changes, in comparison to last ECG on 06/03/17.  - Instructed to f/u with Cardiology. - Ordered Omeprazole 20 mg/daily for acid reflux treatment.  - Instructed to f/u if symptoms remain or worsen.  - EKG 12-Lead  Meds ordered this encounter  Medications  . gabapentin (NEURONTIN) 300 MG capsule    Sig: Take 2 capsules (600 mg total) by mouth 3 (three) times daily.    Dispense:  180 capsule    Refill:  3  . omeprazole (PRILOSEC) 20 MG capsule    Sig: Take 1 capsule (20 mg total) by mouth daily.    Dispense:  30 capsule    Refill:  3    Follow-up: Return in about 3 months (around 09/13/2017).   Maleiya Pergola H. Oluwanifemi Susman, Student-NP

## 2017-06-15 ENCOUNTER — Encounter: Payer: Self-pay | Admitting: Internal Medicine

## 2017-06-17 ENCOUNTER — Encounter (HOSPITAL_COMMUNITY): Payer: Medicare HMO

## 2017-06-18 ENCOUNTER — Other Ambulatory Visit (HOSPITAL_COMMUNITY): Payer: Self-pay

## 2017-06-18 NOTE — Progress Notes (Signed)
Paramedicine Encounter    Patient ID: [name] [name], female    DOB: 1961-06-15, 56 y.o.   MRN: [id number]   Patient Care Team: Ladell Pier, MD as PCP - General (Internal Medicine) Loletta Specter, RN as [place] [facility]  Patient Active Problem List   Diagnosis Date Noted  . Chronic cough 04/10/2017  . Rheumatoid arthritis involving multiple sites ([place]) 04/10/2017  . Anxiety and depression 04/10/2017  . Adjustment disorder with depressed mood 12/11/2016  . GAD (generalized anxiety disorder) 08/14/2016  . Morbid obesity ([place]) 08/14/2016  . Stroke ([place]) 06/27/2016  . Right sided weakness 06/27/2016  . Essential hypertension 06/27/2016  . Atypical chest pain 06/27/2016  . Difficult airway 03/29/2016  . 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 OSA on CPAP 02/20/2016  . Chronic pain syndrome 01/31/2016  . Vaginal bleeding 11/20/2015  . Hematochezia 11/20/2015  . Pulmonary embolus ([place]) 11/15/2015  . Vertigo 11/15/2015  . Depression 11/15/2015    Current Outpatient Prescriptions:  .  acetaminophen-codeine (TYLENOL #3) 300-30 MG tablet, Take 1 tablet by mouth every 8 (eight) hours as needed for moderate pain. Max total Tyelnol < 2000mg  /day, Disp: 50 tablet, Rfl: 2 .  albuterol (PROVENTIL HFA;VENTOLIN HFA) 108 (90 Base) MCG/ACT inhaler, Inhale 1-2 puffs into the lungs every 6 (six) hours as needed for wheezing or shortness of breath., Disp: 6.7 g, Rfl: 5 .  atorvastatin (LIPITOR) 20 MG tablet, Take 1 tablet (20 mg total) by mouth daily at 6 PM., Disp: 90 tablet, Rfl: 2 .  cyclobenzaprine (FLEXERIL) 5 MG tablet, Take 1 tablet (5 mg total) by mouth 3 (three) times daily as needed for muscle spasms., Disp: 30 tablet, Rfl: 1 .  docusate sodium (COLACE) 100 MG capsule, Take 1 capsule (100 mg total) by mouth every 12 (twelve) hours., Disp: 30 capsule, Rfl: 0 .  ferrous sulfate (FERROUSUL) 325 (65 FE) MG tablet, Take 1  tablet (325 mg total) by mouth daily with breakfast., Disp: 90 tablet, Rfl: 3 .  fluticasone (FLONASE) 50 MCG/ACT nasal spray, Place 2 sprays into both nostrils daily., Disp: 16 g, Rfl: 6 .  furosemide (LASIX) 40 MG tablet, Take 1 tablet (40 mg total) by mouth daily. Take extra 40 mg tablet once in the afternoon AS NEEDED for weight gain 3 lbs or more., Disp: 60 tablet, Rfl: 3 .  gabapentin (NEURONTIN) 300 MG capsule, Take 2 capsules (600 mg total) by mouth 3 (three) times daily., Disp: 180 capsule, Rfl: 3 .  loratadine (CLARITIN) 10 MG tablet, Take 1 tablet (10 mg total) by mouth daily., Disp: 30 tablet, Rfl: 11 .  meclizine (ANTIVERT) 25 MG tablet, TAKE ONE TABLET BY MOUTH THREE TIMES DAILY AS NEEDED FOR  DIZZINESS, Disp: 60 tablet, Rfl: 0 .  omeprazole (PRILOSEC) 20 MG capsule, Take 1 capsule (20 mg total) by mouth daily., Disp: 30 capsule, Rfl: 3 .  potassium chloride SA (K-DUR,KLOR-CON) 20 MEQ tablet, Take 1 tablet (20 mEq total) by mouth 2 (two) times daily., Disp: 10 tablet, Rfl: 0 .  Riociguat (ADEMPAS) 2 MG TABS, Take 2 mg by mouth 3 (three) times daily., Disp: , Rfl:  .  rivaroxaban (XARELTO) 20 MG TABS tablet, Take 1 tablet (20 mg total) by mouth daily with supper., Disp: 90 tablet, Rfl: 3 .  potassium chloride (K-DUR) 10 MEQ tablet, TAKE 1 TABLET BY MOUTH TWICE DAILY (Patient not taking: Reported on 06/10/2017), Disp: 180 tablet, Rfl: 1 .  traMADol (ULTRAM) 50 MG tablet,  Take 1 tablet (50 mg total) by mouth every 6 (six) hours as needed. (Patient not taking: Reported on 06/18/2017), Disp: 15 tablet, Rfl: 0 No Known Allergies   Social History   Social History  . Marital status: Married    Spouse name: N/A  . Number of children: N/A  . Years of education: N/A   Occupational History  . Not on file.   Social History Main Topics  . Smoking status: Never Smoker  . Smokeless tobacco: Never Used  . Alcohol use No  . Drug use: No  . Sexual activity: Yes    Partners: Male    Birth  control/ protection: Surgical   Other Topics Concern  . Not on file   Social History Narrative  . No narrative on file    Physical Exam      Future Appointments Date Time Provider [place]  06/20/2017 9:00 AM [facility] ROOM [facility] None  06/24/2017 5:00 PM [facility] [place]  06/27/2017 2:30 PM [facility] PA/NP [facility] None  07/23/2017 9:00 AM [facility] ROOM [facility] None  08/08/2017 11:15 AM Ladell Pier, MD [facility] None  08/22/2017 9:00 AM [facility] ROOM [facility] None   BP (!) 124/0 Comment: 124/systolic  Pulse 90   Resp 15   SpO2 98%  Weight yesterday-325 on Sunday  Last visit weight-325  Pt states she is hanging in there, she has a couple family members that is very sick and she is very worried about them. Pt reports she stopped PT due to extreme pains.  She has 2 different potassium doses listed- She states she gets sob upon minimal- Transportation is an issue for her.  Pt denies any h/a, she still has dizziness but needs refills on her meclizine.  resch her last missed clinic appointment.     ACTION: Home visit completed  Marylouise Stacks, EMT-Paramedic 06/18/17

## 2017-06-19 ENCOUNTER — Encounter (HOSPITAL_COMMUNITY): Payer: Medicare HMO

## 2017-06-20 ENCOUNTER — Telehealth: Payer: Self-pay | Admitting: Internal Medicine

## 2017-06-20 ENCOUNTER — Encounter (HOSPITAL_COMMUNITY): Admission: RE | Admit: 2017-06-20 | Payer: Medicare HMO | Source: Ambulatory Visit

## 2017-06-20 NOTE — Telephone Encounter (Signed)
Pt called since she was here last week a need to know about the med, since she is taking the med as prescribe but is not helping, please call her back

## 2017-06-23 ENCOUNTER — Encounter (HOSPITAL_COMMUNITY)
Admission: RE | Admit: 2017-06-23 | Discharge: 2017-06-23 | Disposition: A | Discharge status: Home / Self Care | Payer: Medicare HMO | Source: Ambulatory Visit | Attending: Internal Medicine | Admitting: Internal Medicine

## 2017-06-23 ENCOUNTER — Encounter (HOSPITAL_COMMUNITY): Payer: Self-pay

## 2017-06-23 DIAGNOSIS — Z09 Encounter for follow-up examination after completed treatment for conditions other than malignant neoplasm: Secondary | ICD-10-CM | POA: Insufficient documentation

## 2017-06-23 MED ORDER — SODIUM CHLORIDE 0.9% FLUSH
10.0000 mL | INTRAVENOUS | Status: DC
  Administered 2017-06-23: 10 mL via INTRAVENOUS

## 2017-06-23 MED ORDER — HEPARIN SOD (PORK) LOCK FLUSH 100 UNIT/ML IV SOLN
500.0000 [IU] | INTRAVENOUS | Status: DC
  Administered 2017-06-23: 500 [IU]
  Filled 2017-06-23: qty 5

## 2017-06-24 ENCOUNTER — Ambulatory Visit (HOSPITAL_COMMUNITY)
Admission: RE | Admit: 2017-06-24 | Discharge: 2017-06-24 | Disposition: A | Discharge status: Home / Self Care | Payer: Medicare HMO | Source: Ambulatory Visit | Attending: Internal Medicine | Admitting: Internal Medicine

## 2017-06-24 ENCOUNTER — Encounter (HOSPITAL_COMMUNITY): Payer: Medicare HMO

## 2017-06-24 DIAGNOSIS — M4316 Spondylolisthesis, lumbar region: Secondary | ICD-10-CM | POA: Diagnosis not present

## 2017-06-24 DIAGNOSIS — M5416 Radiculopathy, lumbar region: Secondary | ICD-10-CM | POA: Diagnosis not present

## 2017-06-24 DIAGNOSIS — M8938 Hypertrophy of bone, other site: Secondary | ICD-10-CM | POA: Insufficient documentation

## 2017-06-24 DIAGNOSIS — M545 Low back pain: Secondary | ICD-10-CM | POA: Diagnosis not present

## 2017-06-24 NOTE — Telephone Encounter (Signed)
Returned pt call and pt states that none of the pain medicine is helping. Pt states she is taking the tylenol 3 and the flexeril pt states that none of it helps. Pt states she even went to the store and brought otc tylenol. Pt states she has her MRI today. Pt states she is suffering in pain and she doesn't like the feeling

## 2017-06-24 NOTE — Telephone Encounter (Signed)
Increase Flexeril to 10 mg twice daily as needed and if pain is still uncontrolled she will need to be seen by PCP.

## 2017-06-25 NOTE — Telephone Encounter (Signed)
Contacted pt and explained t her that she can increase her flexeril per Dr. Jarold Song pt states she understands and she is just waiting for her MRI results

## 2017-06-26 ENCOUNTER — Encounter (HOSPITAL_COMMUNITY): Payer: Medicare HMO

## 2017-06-27 ENCOUNTER — Other Ambulatory Visit (HOSPITAL_COMMUNITY): Payer: Self-pay

## 2017-06-27 ENCOUNTER — Telehealth (HOSPITAL_COMMUNITY): Payer: Self-pay

## 2017-06-27 ENCOUNTER — Ambulatory Visit (HOSPITAL_COMMUNITY)
Admission: RE | Admit: 2017-06-27 | Discharge: 2017-06-27 | Disposition: A | Discharge status: Home / Self Care | Payer: Medicare HMO | Source: Ambulatory Visit | Attending: Internal Medicine | Admitting: Internal Medicine

## 2017-06-27 VITALS — BP 94/0 | HR 104 | Wt 333.0 lb

## 2017-06-27 DIAGNOSIS — Z79899 Other long term (current) drug therapy: Secondary | ICD-10-CM | POA: Diagnosis not present

## 2017-06-27 DIAGNOSIS — M069 Rheumatoid arthritis, unspecified: Secondary | ICD-10-CM

## 2017-06-27 DIAGNOSIS — R42 Dizziness and giddiness: Secondary | ICD-10-CM | POA: Diagnosis not present

## 2017-06-27 DIAGNOSIS — I10 Essential (primary) hypertension: Secondary | ICD-10-CM | POA: Diagnosis not present

## 2017-06-27 DIAGNOSIS — Z86711 Personal history of pulmonary embolism: Secondary | ICD-10-CM | POA: Insufficient documentation

## 2017-06-27 DIAGNOSIS — I959 Hypotension, unspecified: Secondary | ICD-10-CM | POA: Insufficient documentation

## 2017-06-27 DIAGNOSIS — Z7901 Long term (current) use of anticoagulants: Secondary | ICD-10-CM | POA: Diagnosis not present

## 2017-06-27 DIAGNOSIS — G4733 Obstructive sleep apnea (adult) (pediatric): Secondary | ICD-10-CM | POA: Diagnosis not present

## 2017-06-27 DIAGNOSIS — G894 Chronic pain syndrome: Secondary | ICD-10-CM | POA: Diagnosis not present

## 2017-06-27 DIAGNOSIS — R079 Chest pain, unspecified: Secondary | ICD-10-CM | POA: Diagnosis not present

## 2017-06-27 DIAGNOSIS — I5032 Chronic diastolic (congestive) heart failure: Secondary | ICD-10-CM

## 2017-06-27 DIAGNOSIS — I2724 Chronic thromboembolic pulmonary hypertension: Secondary | ICD-10-CM | POA: Diagnosis not present

## 2017-06-27 DIAGNOSIS — M797 Fibromyalgia: Secondary | ICD-10-CM | POA: Insufficient documentation

## 2017-06-27 DIAGNOSIS — I270 Primary pulmonary hypertension: Secondary | ICD-10-CM | POA: Diagnosis not present

## 2017-06-27 DIAGNOSIS — E669 Obesity, unspecified: Secondary | ICD-10-CM

## 2017-06-27 DIAGNOSIS — F329 Major depressive disorder, single episode, unspecified: Secondary | ICD-10-CM | POA: Insufficient documentation

## 2017-06-27 DIAGNOSIS — Z6841 Body Mass Index (BMI) 40.0 and over, adult: Secondary | ICD-10-CM | POA: Insufficient documentation

## 2017-06-27 DIAGNOSIS — F419 Anxiety disorder, unspecified: Secondary | ICD-10-CM | POA: Insufficient documentation

## 2017-06-27 MED ORDER — MECLIZINE HCL 25 MG PO TABS: ORAL_TABLET | ORAL | 0 refills | Status: DC

## 2017-06-27 NOTE — Progress Notes (Signed)
ADVANCED HF CLINIC NOTE  Date:  06/27/2017   ID:  [name] [name], DOB November 28, 1960, MRN [id number]  PCP:  Ladell Pier, MD  HF MD: Dr Haroldine Laws    HPI: 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   Patient was in her [place] until 08/2015 when she started getting symptoms of dyspnea on exertion, dizziness, bilateral LE swelling, and constant R>L non-specific chest pain. These symptoms progressively worsened over several days prompting her to go to [facility] in her then-hometown of [place] in 09/2015 where she was diagnosed with PE and started on anticoagulation. She was very briefly on warfarin but quickly switched to Xarelto. She was referred to [facility] in 12/2015. chocardiogram (12-2015) showed normal LVEF 55-60%, normal LA, normal RV size, RVSP estimated as 70 mm Hg. She further was referred to [facility] and saw Dr. Karena Addison on 03/14/2016 where patient had several studies including V/Q scan which found high prob for PE; studies c/w CTEPH. It was felt that her chronic chest pain was caused by chronic PE. RHC/LHC/Pulm Angiogram 03/2016 confirmed diagnosis of CTEPH. She underwent pulmonary thromboembolectomy 03/31/2016 at [facility] with IVC filter placement. Pre-op cath with no CAD.   Seen by Dr. Karena Addison in 04/2016 for follow up. He felt like her heart size was decreasing. Plan was for lifelong [place] and ECHO and V/Q in 4 months. Dr. Karena Addison has since left the [place] practice.  CT angio at [facility] on 05/15/16 showed " persistent but partially recanalized PE within right lower lobe pulmonary artery. No new PE identified. "  She was admitted to [facility] in 06/2016 for continued chest pain and parasthesias. V/Q scan on 06/27/16 showed prominent ventilation perfusion mismatch in right c/w chronic PE. Head CT  negative. She was continued on Xarelto and told to follow up with [facility]. Patient doesn't want to have to travel to [facility] for care so [place] care here in [place].   Saw Nell Range recently for ongoing CP. Referred here for further management of CP and PAH. Echo 10/07/16. EF 55%. RV normal RVSP 57mmHG  Presents today for follow up. Feels terrible, says that she is fatigued with ADL's easily. Sleeps often. She was walking to her mothers room at the hospital earlier this week and had an episode of presyncope. Continues to feel dizzy and lightheaded daily. Tearful, says that she feels the worst she has felt in a long time.    ECHO 8/18: EF 55-60%  RV normal RVSP 32mmHG   10/2016 RHC RA = 8 RV = 64/13 PA = 65/19 (35) PCW = 9 Fick cardiac output/index = 7.5/3.1 PVR = 3.5 WU Ao sat = 96% PA sat = 68%,70% SVC sat = 71% Assessment: 1. Mild residual PAH in the setting of CTEPH s/p pulmonary thrombolectomy 2. Normal cardiac output 3. Normal left-sided filling pressures  Past Medical History:  Diagnosis Date  . Arthritis   . Depression   . Diverticulitis   . Hyperlipidemia   . Hypertension   . Obesity   . Pneumonia   . Pulmonary embolism [facility])     Past Surgical History:  Procedure Laterality Date  . ABDOMINAL HYSTERECTOMY    . BREAST REDUCTION SURGERY    . CHOLECYSTECTOMY    . EMBOLECTOMY N/A    pulmonary embolectomy  .  REDUCTION MAMMAPLASTY Bilateral   . RIGHT HEART CATH N/A 10/28/2016   Procedure: Right Heart Cath;  Surgeon: Jolaine Artist, MD;  Location: [place] [facility];  Service: Cardiovascular;  Laterality: N/A;    Current Medications:  Current Outpatient Prescriptions on File Prior to Encounter  Medication Sig Dispense Refill  . acetaminophen-codeine (TYLENOL #3) 300-30 MG tablet Take 1 tablet by mouth every 8 (eight) hours as needed for moderate pain. Max total Tyelnol < 2000mg  /day 50 tablet 2  . albuterol (PROVENTIL HFA;VENTOLIN HFA) 108 (90 Base)  MCG/ACT inhaler Inhale 1-2 puffs into the lungs every 6 (six) hours as needed for wheezing or shortness of breath. 6.7 g 5  . atorvastatin (LIPITOR) 20 MG tablet Take 1 tablet (20 mg total) by mouth daily at 6 PM. 90 tablet 2  . cyclobenzaprine (FLEXERIL) 5 MG tablet Take 1 tablet (5 mg total) by mouth 3 (three) times daily as needed for muscle spasms. 30 tablet 1  . docusate sodium (COLACE) 100 MG capsule Take 1 capsule (100 mg total) by mouth every 12 (twelve) hours. 30 capsule 0  . ferrous sulfate (FERROUSUL) 325 (65 FE) MG tablet Take 1 tablet (325 mg total) by mouth daily with breakfast. 90 tablet 3  . fluticasone (FLONASE) 50 MCG/ACT nasal spray Place 2 sprays into both nostrils daily. 16 g 6  . furosemide (LASIX) 40 MG tablet Take 1 tablet (40 mg total) by mouth daily. Take extra 40 mg tablet once in the afternoon AS NEEDED for weight gain 3 lbs or more. 60 tablet 3  . gabapentin (NEURONTIN) 300 MG capsule Take 2 capsules (600 mg total) by mouth 3 (three) times daily. 180 capsule 3  . loratadine (CLARITIN) 10 MG tablet Take 1 tablet (10 mg total) by mouth daily. 30 tablet 11  . meclizine (ANTIVERT) 25 MG tablet TAKE ONE TABLET BY MOUTH THREE TIMES DAILY AS NEEDED FOR  DIZZINESS 60 tablet 0  . omeprazole (PRILOSEC) 20 MG capsule Take 1 capsule (20 mg total) by mouth daily. 30 capsule 3  . Riociguat (ADEMPAS) 2 MG TABS Take 2 mg by mouth 3 (three) times daily.    . rivaroxaban (XARELTO) 20 MG TABS tablet Take 1 tablet (20 mg total) by mouth daily with supper. 90 tablet 3  . traMADol (ULTRAM) 50 MG tablet Take 1 tablet (50 mg total) by mouth every 6 (six) hours as needed. 15 tablet 0   No current facility-administered medications on file prior to encounter.     Allergies:   [name].   Social History   Social History  . Marital status: Married    Spouse name: N/A  . Number of children: N/A  . Years of education: N/A   Social History Main Topics  . Smoking  status: Never Smoker  . Smokeless tobacco: Never Used  . Alcohol use No  . Drug use: No  . Sexual activity: Yes    Partners: Male    Birth control/ protection: Surgical   Other Topics Concern  . Not on file   Social History Narrative  . No narrative on file     Family History:  The patient's family history includes Anxiety disorder in her cousin, sister, and sister; Bipolar disorder in her sister and sister; Dementia in her father and mother; Drug abuse in her sister and sister; Lung cancer in her maternal grandmother; Sexual abuse in her maternal aunt.      ROS:   Please see the  history of present illness.    ROS All other systems reviewed and are negative.   PHYSICAL EXAM:   VS:  BP (!) 94/0 (BP Location: Right Wrist, Patient Position: Sitting, Cuff Size: Normal) Comment: doppler  Pulse (!) 104   Wt (!) 333 lb (151 kg)   SpO2 97%   BMI 58.99 kg/m     General: Fatigued appearing. No resp difficulty. HEENT: Normal Neck: Supple, Thick. Hard to assess JVP due to body habitus, does not appear elevated. Carotids 2+ bilat; no bruits. No thyromegaly or nodule noted. Cor: PMI nondisplaced. RRR, No M/G/R noted Lungs: CTAB, normal effort. Abdomen: Soft, non-tender, non-distended, no HSM. No bruits or masses. +BS  Extremities: No cyanosis, clubbing, or rash. R and LLE no edema.  Neuro: Alert & orientedx3, cranial nerves grossly intact. moves all 4 extremities w/o difficulty. Affect pleasant     Wt Readings from Last 3 Encounters:  06/27/17 (!) 333 lb (151 kg)  06/13/17 (!) 327 lb 9.6 oz (148.6 kg)  06/10/17 (!) 325 lb 6.4 oz (147.6 kg)      Studies/Labs Reviewed:    Recent Labs: 12/19/2016: ALT 10 06/02/2017: B Natriuretic Peptide 13.5; BUN 12; Creatinine, Ser 0.88; Hemoglobin 12.2; Platelets 281; Potassium 3.3; Sodium 141   Lipid Panel    Component Value Date/Time   CHOL 166 06/27/2016 0346   TRIG 125 06/27/2016 0346   HDL 33 (L) 06/27/2016 0346   CHOLHDL 5.0  06/27/2016 0346   VLDL 25 06/27/2016 0346   LDLCALC 108 (H) 06/27/2016 0346    Additional studies/ records that were reviewed today include:   ECHO 8/18  EF 55-60%  RV normal RVSP 40mmHG   2D ECHO: 12/15/2015 LV EF: 55% -   60% Study Conclusions - Left ventricle: The cavity size was normal. Wall thickness was normal. Systolic function was normal. The estimated ejection  fraction was in the range of 55% to 60%. - Pulmonary arteries: PA peak pressure: 70 mm Hg (S). - Pericardium, extracardiac: A trivial pericardial effusion was   identified.   2D ECHO 04/05/16 INTERPRETATION:NORMAL LEFT VENTRICULAR FUNCTION WITH MILD LVHSEVERE RV SYSTOLIC DYSFUNCTION (See above) VALVULAR REGURGITATION: TRIVIAL PR, MILD TR NO VALVULAR STENOSIS TRIVIAL PERICARDIAL EFFUSION Compared with prior Echo study on 03/14/2016: RVSP increased from 50 to 69   CATH right heart and coronary angiography: 03/25/2016 [place] Component Name Value Ref Range  Cardiac Index (l/min/m2) 2.3 L/min/m2  Right Atrium Mean Pressure (mmHg) 11 mmHg   Right Ventricle Systolic Pressure (mmHg) 68 mmHg   Pulmonary Artery Mean Pressure (mmHg) 40 mmHg   Pulmonary Wedge Pressure (mmHg) 14 mmHg   Pulmonary Vascular Resistance (Wood units) 4.7    CT angio 05/15/16 IMPRESSION: Persistent but partially recanalized pulmonary embolus within the right lower lobe pulmonary artery. No new pulmonary embolism is identified. Minimal bibasilar atelectatic changes stable from the prior exam.   V/Q scan 06/27/16 IMPRESSION: Prominent ventilation perfusion mismatch noted on the right. Finding suggests high probability right-sided pulmonary embolus in this patient with known right-sided pulmonary embolus.   ASSESSMENT & PLAN:   1. Dizziness/palpitations - Continues to be an issue for her. 48 hour monitor without arrhythmia.  - Continue meclizine.    2. Chronic chest pain:  - Has chronic PE s/p  thrombectomy, LHC performed at [facility] 7/17 - no CAD. - Stable, no recurrence.   3. 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 Haroldine Laws last visit.  -- Adempas recently decreased with low BP, doppler BP today is 94. Per Dr. Haroldine Laws will not increase (pt. Requested increase), instead stop Adempas and start macitenten 10 mg daily.   4. Hypotension - As above.   5. Fibromyalgia/Rheumatoid Arthitis:  - Follows with Rheumatology.   6. Morbid obesity with OHS:  - Encouraged her to focus on increasing activity as tolerated and limiting her portion size.   7. OSA - Compliant with CPAP.    8. Depression/anxiety - Major issue for her.  - Per PCP  Follow up in 1-2 months with Dr. Haroldine Laws.   Arbutus Leas NP-C  2:58 PM

## 2017-06-27 NOTE — Telephone Encounter (Signed)
CHF Clinic appointment reminder call placed to patient for upcoming post-hospital follow up.  Does understand purpose of this appointment and where CHF Clinic is located? Yes  How is patient feeling? Patient still fatigued  Does patient have all of their medications since their recent discharge? Yes   Patient also reminded to take all medications as prescribed on the day of his/her appointment and to bring all medications to this appointment.  Advised to call our office for tardiness or cancellations/rescheduling needs.  Leory Plowman, Guinevere Ferrari

## 2017-06-27 NOTE — Progress Notes (Signed)
Paramedicine Encounter   Patient ID: [name] [name] , female,   DOB: 07/05/1961,55 y.o.,  MRN: [id number]   Met patient in clinic today with provider Erin.   Pt stated that she is dizzy all the time regardless if she's sitting or walking.  Junie Panning talked to pt about increasing her activity throughout the day. She's wearing her CPAP nightly.  During today's visit her B/P 94/P. Pt has been using Mrs. Dash to supplement sodium. She has also stopped eating tv dinners. Junie Panning talked to her about her health conditions (blood clots and pulmonary issues) and depression.  Pt should make small goals for herself to help with excercising; Try low carbs and high protein diets. Pt stated she has lower back pain which radiates down her left leg.  She has an appointment with her PCP Monday for a referral to the spine specialist.     Pt is extremely frustrated and crying due to not being able walk for long periods of time.  She stated that she doesn't like coming "here and she's going to stop coming".  She has joined a chat group discussing her health conditions which she feels good about.  Showerchair came today; still nothing from the guy to install the shower bar. **no meds changes during this visit; Junie Panning will call her back if anything changes.   Time spent with patient 45 mins  Amiaya Mcneeley, EMT-Paramedic 06/27/2017   ACTION: Next visit planned for next week with Bertell Maria

## 2017-06-27 NOTE — Patient Instructions (Signed)
Follow up with Dr. Haroldine Laws 1-2 months.  Take all medication as prescribed the day of your appointment. Bring all medications with you to your appointment.  Do the following things EVERYDAY: 1) Weigh yourself in the morning before breakfast. Write it down and keep it in a log. 2) Take your medicines as prescribed 3) Eat low salt foods-Limit salt (sodium) to 2000 mg per day.  4) Stay as active as you can everyday 5) Limit all fluids for the day to less than 2 liters

## 2017-06-30 ENCOUNTER — Ambulatory Visit: Payer: Medicare HMO | Attending: Internal Medicine | Admitting: Internal Medicine

## 2017-06-30 ENCOUNTER — Encounter: Payer: Self-pay | Admitting: Internal Medicine

## 2017-06-30 ENCOUNTER — Telehealth (HOSPITAL_COMMUNITY): Payer: Self-pay

## 2017-06-30 VITALS — BP 138/86 | HR 106 | Temp 98.0°F | Resp 16 | Wt 336.4 lb

## 2017-06-30 DIAGNOSIS — M069 Rheumatoid arthritis, unspecified: Secondary | ICD-10-CM | POA: Diagnosis not present

## 2017-06-30 DIAGNOSIS — M797 Fibromyalgia: Secondary | ICD-10-CM | POA: Diagnosis not present

## 2017-06-30 DIAGNOSIS — I10 Essential (primary) hypertension: Secondary | ICD-10-CM | POA: Diagnosis not present

## 2017-06-30 DIAGNOSIS — Z7901 Long term (current) use of anticoagulants: Secondary | ICD-10-CM | POA: Insufficient documentation

## 2017-06-30 DIAGNOSIS — M5416 Radiculopathy, lumbar region: Secondary | ICD-10-CM | POA: Insufficient documentation

## 2017-06-30 DIAGNOSIS — F411 Generalized anxiety disorder: Secondary | ICD-10-CM | POA: Insufficient documentation

## 2017-06-30 DIAGNOSIS — K219 Gastro-esophageal reflux disease without esophagitis: Secondary | ICD-10-CM | POA: Insufficient documentation

## 2017-06-30 DIAGNOSIS — Z79899 Other long term (current) drug therapy: Secondary | ICD-10-CM | POA: Insufficient documentation

## 2017-06-30 DIAGNOSIS — F4321 Adjustment disorder with depressed mood: Secondary | ICD-10-CM | POA: Diagnosis not present

## 2017-06-30 DIAGNOSIS — G894 Chronic pain syndrome: Secondary | ICD-10-CM | POA: Insufficient documentation

## 2017-06-30 DIAGNOSIS — R079 Chest pain, unspecified: Secondary | ICD-10-CM | POA: Insufficient documentation

## 2017-06-30 DIAGNOSIS — G4733 Obstructive sleep apnea (adult) (pediatric): Secondary | ICD-10-CM | POA: Diagnosis not present

## 2017-06-30 MED ORDER — ACETAMINOPHEN-CODEINE #4 300-60 MG PO TABS: 1.0000 | ORAL_TABLET | Freq: Three times a day (TID) | ORAL | 1 refills | Status: DC | PRN

## 2017-06-30 MED ORDER — AMITRIPTYLINE HCL 10 MG PO TABS: 10.0000 mg | ORAL_TABLET | Freq: Every day | ORAL | 2 refills | Status: DC

## 2017-06-30 NOTE — Progress Notes (Signed)
Patient ID: [name] [name], female    DOB: May 17, 1961  MRN: [id number]  CC:  Follow-up back pain Subjective:  [name] [name] is a 56 y.o. female who presents for UC visit. Husband is with her. Her concerns today include:  56 year old female with history of HTN, morbid obesity, PAH due to CTEPHstatus post thromboembolectomy 7/17 with IVC filter placement and now on [place], OSA, anxiety/depression, chronic pain syndrome with fibromyalgia and questionable RA, chronic chest pain, chronic anemia on iron  1. Acute lower back pain: Since last visit patient had the MRI of lumbar spine. Results are below: IMPRESSION: 1. No acute osseous abnormality. 2. Minimal L4-5 grade 1 anterolisthesis. 3. L4-5 and L5-S1 moderate facet hypertrophy. 4. No significant foraminal or canal stenosis.  She reports pressure and burning pain in LT lumbar region. -"numbness and tingling and pain all the way down to my toes." -Nothing makes it better - there whether she is standing and sitting down or lying down. When sitting down and she finds that leaning back helps a little -taking Tylenol #3, Gabapentin and Flexeril.  These give pain relief for about an hour when taken in combination. Now taking 2 Regular Tylenol in between but careful not to exceed recommended daily allowance -would like PCS services, needs help with showers/baths, putting on shoes/clothing and light house keeping. Everything is getting difficult with her breathing and now lower back issue.   Saw cardiologist since last visit for CP. -Omeprazole helps with GERD. But she saw it causes wgh gain. Would like referral to see surgeon for weight reduction surgery.  -Reports that her cardiologist felt that she was a candidate and had started the process of referring her to a dietitian, however, her insurance would not cover for her to see one -She is frustrated with her weight. Feels that she is doing everything she could with her diet, eating smaller portions.  Limited by the amount of exercise that she can do . Patient Active Problem List   Diagnosis Date Noted  . Chronic cough 04/10/2017  . Rheumatoid arthritis involving multiple sites ([place]) 04/10/2017  . Anxiety and depression 04/10/2017  . Adjustment disorder with depressed mood 12/11/2016  . GAD (generalized anxiety disorder) 08/14/2016  . Morbid obesity ([place]) 08/14/2016  . Stroke ([place]) 06/27/2016  . Right sided weakness 06/27/2016  . Essential hypertension 06/27/2016  . Atypical chest pain 06/27/2016  . Difficult airway 03/29/2016  . CTEPH (chronic thromboembolic pulmonary hypertension) ([place]) 03/14/2016  . OSA on CPAP 02/20/2016  . Chronic pain syndrome 01/31/2016  . Vaginal bleeding 11/20/2015  . Hematochezia 11/20/2015  . Pulmonary embolus ([place]) 11/15/2015  . Vertigo 11/15/2015  . Depression 11/15/2015     Current Outpatient Prescriptions on File Prior to Visit  Medication Sig Dispense Refill  . albuterol (PROVENTIL HFA;VENTOLIN HFA) 108 (90 Base) MCG/ACT inhaler Inhale 1-2 puffs into the lungs every 6 (six) hours as needed for wheezing or shortness of breath. 6.7 g 5  . atorvastatin (LIPITOR) 20 MG tablet Take 1 tablet (20 mg total) by mouth daily at 6 PM. 90 tablet 2  . cyclobenzaprine (FLEXERIL) 5 MG tablet Take 1 tablet (5 mg total) by mouth 3 (three) times daily as needed for muscle spasms. 30 tablet 1  . docusate sodium (COLACE) 100 MG capsule Take 1 capsule (100 mg total) by mouth every 12 (twelve) hours. 30 capsule 0  . ferrous sulfate (FERROUSUL) 325 (65 FE) MG tablet Take 1 tablet (325 mg total) by mouth daily with breakfast. 90 tablet  3  . fluticasone (FLONASE) 50 MCG/ACT nasal spray Place 2 sprays into both nostrils daily. 16 g 6  . furosemide (LASIX) 40 MG tablet Take 1 tablet (40 mg total) by mouth daily. Take extra 40 mg tablet once in the afternoon AS NEEDED for weight gain 3 lbs or more. 60 tablet 3  . gabapentin (NEURONTIN) 300 MG capsule Take 2 capsules (600 mg  total) by mouth 3 (three) times daily. 180 capsule 3  . loratadine (CLARITIN) 10 MG tablet Take 1 tablet (10 mg total) by mouth daily. 30 tablet 11  . meclizine (ANTIVERT) 25 MG tablet TAKE ONE TABLET BY MOUTH THREE TIMES DAILY AS NEEDED FOR  DIZZINESS 60 tablet 0  . omeprazole (PRILOSEC) 20 MG capsule Take 1 capsule (20 mg total) by mouth daily. 30 capsule 3  . potassium chloride (K-DUR,KLOR-CON) 10 MEQ tablet Take 20 mEq by mouth 2 (two) times daily.    . Riociguat (ADEMPAS) 2 MG TABS Take 2 mg by mouth 3 (three) times daily.    . rivaroxaban (XARELTO) 20 MG TABS tablet Take 1 tablet (20 mg total) by mouth daily with supper. 90 tablet 3   No current facility-administered medications on file prior to visit.     No Known Allergies   ROS: Review of Systems Negative except as stated above  PHYSICAL EXAM: BP 138/86   Pulse (!) 106   Temp 98 F (36.7 C) (Oral)   Resp 16   Wt (!) 336 lb 6.4 oz (152.6 kg)   SpO2 94%   BMI 59.59 kg/m   Physical Exam General appearance - alert, well appearing, and in no distress Mental status - alert, oriented to person, place, and time, normal mood, behavior, speech, dress, motor activity, and thought processes   Lab Results  Component Value Date   HGBA1C 5.6 06/27/2016    ASSESSMENT AND PLAN: 1. Lumbar radiculopathy -MRI surprisingly does not show any displaced disks or stenosis. Did show moderate facet hypertrophy and anterolisthesis. This was a non-contrast study. Will speak with radiologist to see if worth repeating with contrast. In mean time, will change Tylenol #3 to #4. Advised to stop OTC Tylenol. Recommended Cymbalta to help with pain and dep/anxiety. Pt declined stating she was on it before and it caused wgh gain. She is agreeable to trying low dose Elavil instead -Also recommended referral to pain specialist. Patient prefers to see a spine specialist instead. Referral submitted -She does meet criteria for [facility] services as she needs help  with clothing and baths. Her husband works during the day. Patient has some Medicare advantage plan. Advised that she speak with her insurance to see whether they cover for PCS services and if they do we can submit the referral - acetaminophen-codeine (TYLENOL #4) 300-60 MG tablet; Take 1 tablet by mouth every 8 (eight) hours as needed for moderate pain.  Dispense: 70 tablet; Refill: 1 - amitriptyline (ELAVIL) 10 MG tablet; Take 1 tablet (10 mg total) by mouth at bedtime.  Dispense: 30 tablet; Refill: 2 - Ambulatory referral to Neurosurgery  2. Morbid obesity ([place]) - Amb Referral to Bariatric Surgery  3. Gastroesophageal reflux disease without esophagitis Better on Omeprazole.   I spent 25 mins with direct face to face eval and counseling and coordinating care. Patient was given the opportunity to ask questions.  Patient verbalized understanding of the plan and was able to repeat key elements of the plan.   Orders Placed This Encounter  Procedures  . Ambulatory referral to  Neurosurgery  . Amb Referral to Bariatric Surgery     Requested Prescriptions   Signed Prescriptions Disp Refills  . acetaminophen-codeine (TYLENOL #4) 300-60 MG tablet 70 tablet 1    Sig: Take 1 tablet by mouth every 8 (eight) hours as needed for moderate pain.  Marland Kitchen amitriptyline (ELAVIL) 10 MG tablet 30 tablet 2    Sig: Take 1 tablet (10 mg total) by mouth at bedtime.    Future Appointments Date Time Provider [place]  07/23/2017 9:00 AM [facility] ROOM [facility] None  08/08/2017 11:15 AM Ladell Pier, MD [facility] None  08/22/2017 9:00 AM [facility] ROOM [facility] None  09/01/2017 10:20 AM Bensimhon, Shaune Pascal, MD [facility] None    Karle Plumber, MD, Rosalita Chessman

## 2017-06-30 NOTE — Telephone Encounter (Signed)
Patient recommended by Dr. Haroldine Laws to start Opsumit 10 mg once daily and stop adempas once she received new medication. LVMTCB to advise patient and have her sign opsumit form (can either come in to office or we can mail to her to mail back to [place]).  Renee Pain, RN

## 2017-06-30 NOTE — Patient Instructions (Signed)
Stop Tylenol #3. Start Tylenol #4 and Amitriptyline.  I have referred you to bariatric surgeon and to neurosurgeon.   Speak with your insurance about whether the cover for personal care services.

## 2017-06-30 NOTE — Progress Notes (Signed)
Pt states the pain is all on the left side Pt states the pain is in the left leg in the lower back down into the hip area down to the left foot.  Pt states there is a numbing and tingling pain Pt states 4-6 weeks  Pt states the pain is non stop Pt states there is no medicine that helps the pain Pt states the pain is keeping her from sleeping

## 2017-07-01 ENCOUNTER — Telehealth (HOSPITAL_COMMUNITY): Payer: Self-pay

## 2017-07-01 NOTE — Telephone Encounter (Signed)
I returned a call to [name] [name] today. She expressed concern over a suggested medication change from her appointment on Friday. She was further upset because she was not able to see Dr Haroldine Laws. I have spoken to [facility] at the clinic and she stated the medications change was at the request of Dr Haroldine Laws because of her inability to tolerate the Adempas.

## 2017-07-02 ENCOUNTER — Telehealth (HOSPITAL_COMMUNITY): Payer: Self-pay

## 2017-07-02 NOTE — Telephone Encounter (Signed)
Patient returned call to discuss VM regarding medication change (adempas to opsumit per Dr Bensimhon's recommendation). Patient was not able tos ee Dr. Haroldine Laws during her recent visit in CHF clinic and still has a lot of unanswered questions regarding whether she could increase adempas dose before switching to opsumit and if she needs a RHC. Advised patient of opening November 7th with Dr. Haroldine Laws to discuss concerns and clarify plan of care and options.  Patient would like to make this apt before changing medications.  States her SOB is a little better, no worse.  Advised if s/s worsen before this apt to return call to our clinic or report to ED.  Renee Pain, RN

## 2017-07-04 ENCOUNTER — Encounter (HOSPITAL_COMMUNITY): Payer: Self-pay

## 2017-07-04 ENCOUNTER — Other Ambulatory Visit (HOSPITAL_COMMUNITY): Payer: Self-pay

## 2017-07-04 DIAGNOSIS — I272 Pulmonary hypertension, unspecified: Secondary | ICD-10-CM

## 2017-07-04 NOTE — Progress Notes (Signed)
Paramedicine Encounter    Patient ID: [name] [name], female    DOB: [birth year], 56 y.o.   MRN: [id number]   Patient Care Team: [name] Pier, MD as PCP - General (Internal Medicine) [name] Specter, RN as [facility]  Patient Active Problem List   Diagnosis Date Noted  . Chronic cough 04/10/2017  . Rheumatoid arthritis involving multiple sites ([place]) 04/10/2017  . Anxiety and depression 04/10/2017  . Adjustment disorder with depressed mood 12/11/2016  . GAD (generalized anxiety disorder) 08/14/2016  . Morbid obesity ([place]) 08/14/2016  . Stroke ([place]) 06/27/2016  . Right sided weakness 06/27/2016  . Essential hypertension 06/27/2016  . Atypical chest pain 06/27/2016  . Difficult airway 03/29/2016  . 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 OSA on CPAP 02/20/2016  . Chronic pain syndrome 01/31/2016  . Vaginal bleeding 11/20/2015  . Hematochezia 11/20/2015  . Pulmonary embolus ([place]) 11/15/2015  . Vertigo 11/15/2015  . Depression 11/15/2015    Current Outpatient Prescriptions:  .  acetaminophen-codeine (TYLENOL #4) 300-60 MG tablet, Take 1 tablet by mouth every 8 (eight) hours as needed for moderate pain., Disp: 70 tablet, Rfl: 1 .  albuterol (PROVENTIL HFA;VENTOLIN HFA) 108 (90 Base) MCG/ACT inhaler, Inhale 1-2 puffs into the lungs every 6 (six) hours as needed for wheezing or shortness of breath., Disp: 6.7 g, Rfl: 5 .  amitriptyline (ELAVIL) 10 MG tablet, Take 1 tablet (10 mg total) by mouth at bedtime., Disp: 30 tablet, Rfl: 2 .  atorvastatin (LIPITOR) 20 MG tablet, Take 1 tablet (20 mg total) by mouth daily at 6 PM., Disp: 90 tablet, Rfl: 2 .  cyclobenzaprine (FLEXERIL) 5 MG tablet, Take 1 tablet (5 mg total) by mouth 3 (three) times daily as needed for muscle spasms., Disp: 30 tablet, Rfl: 1 .  docusate sodium (COLACE) 100 MG capsule, Take 1 capsule (100 mg total) by mouth every 12 (twelve) hours., Disp:  30 capsule, Rfl: 0 .  ferrous sulfate (FERROUSUL) 325 (65 FE) MG tablet, Take 1 tablet (325 mg total) by mouth daily with breakfast., Disp: 90 tablet, Rfl: 3 .  fluticasone (FLONASE) 50 MCG/ACT nasal spray, Place 2 sprays into both nostrils daily., Disp: 16 g, Rfl: 6 .  furosemide (LASIX) 40 MG tablet, Take 1 tablet (40 mg total) by mouth daily. Take extra 40 mg tablet once in the afternoon AS NEEDED for weight gain 3 lbs or more., Disp: 60 tablet, Rfl: 3 .  gabapentin (NEURONTIN) 300 MG capsule, Take 2 capsules (600 mg total) by mouth 3 (three) times daily., Disp: 180 capsule, Rfl: 3 .  loratadine (CLARITIN) 10 MG tablet, Take 1 tablet (10 mg total) by mouth daily., Disp: 30 tablet, Rfl: 11 .  meclizine (ANTIVERT) 25 MG tablet, TAKE ONE TABLET BY MOUTH THREE TIMES DAILY AS NEEDED FOR  DIZZINESS, Disp: 60 tablet, Rfl: 0 .  omeprazole (PRILOSEC) 20 MG capsule, Take 1 capsule (20 mg total) by mouth daily., Disp: 30 capsule, Rfl: 3 .  potassium chloride (K-DUR,KLOR-CON) 10 MEQ tablet, Take 20 mEq by mouth 2 (two) times daily., Disp: , Rfl:  .  Riociguat (ADEMPAS) 2 MG TABS, Take 2 mg by mouth 3 (three) times daily., Disp: , Rfl:  .  rivaroxaban (XARELTO) 20 MG TABS tablet, Take 1 tablet (20 mg total) by mouth daily with supper., Disp: 90 tablet, Rfl: 3 No Known Allergies    Social History   Social History  . Marital status: Married    Spouse name:  N/A  . Number of children: N/A  . Years of education: N/A   Occupational History  . Not on file.   Social History Main Topics  . Smoking status: Never Smoker  . Smokeless tobacco: Never Used  . Alcohol use No  . Drug use: No  . Sexual activity: Yes    Partners: Male    Birth control/ protection: Surgical   Other Topics Concern  . Not on file   Social History Narrative  . No narrative on file    Physical Exam  Constitutional: She is oriented to person, place, and time.  Cardiovascular: Normal rate and regular rhythm.    Pulmonary/Chest: Effort normal and breath sounds normal. No respiratory distress. She has no wheezes. She has no rales.  Abdominal: Soft.  Musculoskeletal: Normal range of motion. She exhibits edema.  Neurological: She is alert and oriented to person, place, and time.  Skin: Skin is warm and dry.  Psychiatric: She has a normal mood and affect.        Future Appointments Date Time Provider [place]  07/16/2017 10:40 AM [name], [name] Pascal, MD [facility] None  07/23/2017 9:00 AM [facility] ROOM [facility] None  08/08/2017 11:15 AM [name] Pier, MD [facility] None  08/22/2017 9:00 AM [facility] ROOM [facility] None  09/01/2017 10:20 AM [name], [name] Pascal, MD [facility] None    BP 104/70 (BP Location: Left Arm, Patient Position: Sitting, Cuff Size: Large)   Pulse 90   Resp 16   Wt (!) 328 lb (148.8 kg)   SpO2 98%   BMI 58.10 kg/m  Weight yesterday- 330 lb Last visit weight- 333 lb  [name] [name] was seen at home today and stated she is feeling slightly better today than she has over the last several. She stated her dizziness was controlled today but when she does get dizzy the Antivert does not always work. She had several questions about the new medication which they want her to try but she feels confident that Dr [name] [name] will be able to aswer them at her upcoming appointment. To help with this, I made a list of questions she brought up with me so she can feel like she gets the most from hr visit. Her medications were verified and her pillbox was refilled. She reports the shower chair has been extremely helpful but still wants to have the grab bars installed.   Time spent with patient: 54 minutes  [name], EMT 07/04/17  ACTION: Home visit completed Next visit planned for 1 week

## 2017-07-04 NOTE — Progress Notes (Signed)
Discharge Progress Report  Patient Details  Name: [name] [name] MRN: [id number] Date of Birth: [birth year] Referring Provider:     Pulmonary Rehab Walk Test from 03/25/2017 in [place]  Referring Provider  Dr. Haroldine Laws       Number of Visits: 6  Reason for Discharge:  Early Exit:  Personal and Lack of attendance  Smoking History:  History  Smoking Status  . Never Smoker  Smokeless Tobacco  . Never Used    Diagnosis:  Pulmonary hypertension ([place])  ADL UCSD:   Initial Exercise Prescription:   Discharge Exercise Prescription (Final Exercise Prescription Changes):     Exercise Prescription Changes - 06/05/17 1500      Response to Exercise   Blood Pressure (Admit) 118/60   Blood Pressure (Exercise) 147/85   Blood Pressure (Exit) 108/70   Heart Rate (Admit) 90 bpm   Heart Rate (Exercise) 144 bpm   Heart Rate (Exit) 97 bpm   Oxygen Saturation (Admit) 98 %   Oxygen Saturation (Exercise) 90 %   Oxygen Saturation (Exit) 96 %   Rating of Perceived Exertion (Exercise) 13   Perceived Dyspnea (Exercise) 1   Duration Progress to 45 minutes of aerobic exercise without signs/symptoms of physical distress   Intensity THRR unchanged     Progression   Progression Continue to progress workloads to maintain intensity without signs/symptoms of physical distress.     Resistance Training   Training Prescription Yes   Weight orange bands   Reps 10-15   Time 10 Minutes     NuStep   Level 3   Minutes 17   METs 2     Track   Laps 13   Minutes 17      Functional Capacity:   Psychological, QOL, Others - Outcomes: PHQ 2/9: Depression screen [facility] 2/9 06/30/2017 06/13/2017 06/06/2017 04/10/2017 03/03/2017  Decreased Interest 2 1 2 3 3   Down, Depressed, Hopeless 1 1 1 3 3   PHQ - 2 Score 3 2 3 6 6   Altered sleeping 3 3 3 2 3   Tired, decreased energy 3 1 2 2 2   Change in appetite 2 0 1 2 1   Feeling bad or failure about yourself  1 1 0 1 2   Trouble concentrating 1 1 2 1  -  Moving slowly or fidgety/restless 0 0 0 0 0  Suicidal thoughts 0 0 0 0 0  PHQ-9 Score 13 8 11 14 14   Difficult doing work/chores - - - - -  Some recent data might be hidden    Quality of Life:   Personal Goals: Goals established at orientation with interventions provided to work toward goal.    Personal Goals Discharge:     Goals and Risk Factor Review    Row Name 05/30/17 1401 07/04/17 1503           Core Components/Risk Factors/Patient Goals Review   Personal Goals Review Weight Management/Obesity;Develop more efficient breathing techniques such as purse lipped breathing and diaphragmatic breathing and practicing self-pacing with activity.;Improve shortness of breath with ADL's Weight Management/Obesity;Develop more efficient breathing techniques such as purse lipped breathing and diaphragmatic breathing and practicing self-pacing with activity.;Improve shortness of breath with ADL's      Review patient has only attended 5 exercise sessions since admission but only 2 have been consecutive. expect to see great progress towards pulmonary rehab goals over the next 30 days patient dropped rehab after 6 session related to personal illness. Goals unmet  Expected Outcomes see admission expected outcomes  -         Exercise Goals and Review:   Nutrition & Weight - Outcomes:    Nutrition:   Nutrition Discharge:   Education Questionnaire Score:

## 2017-07-04 NOTE — Addendum Note (Signed)
Encounter addended by: Jewel Baize, RD on: 07/04/2017 12:07 PM<BR>    Actions taken: Flowsheet data copied forward, Visit Navigator Flowsheet section accepted

## 2017-07-09 ENCOUNTER — Telehealth: Payer: Self-pay | Admitting: Internal Medicine

## 2017-07-09 NOTE — Telephone Encounter (Signed)
Pt called states she talked to her insurance company they told pt they will cover both services. For home health they will cover with [facility] services and for bariatric surgery they provided pt with phone number to clinical intake team [phone]. Please f/up

## 2017-07-10 ENCOUNTER — Other Ambulatory Visit: Payer: Self-pay | Admitting: Internal Medicine

## 2017-07-10 DIAGNOSIS — M5442 Lumbago with sciatica, left side: Secondary | ICD-10-CM

## 2017-07-10 NOTE — Telephone Encounter (Signed)
Pt called to request a refill for  cyclobenzaprine (FLEXERIL) 5 MG tablet   according to our records it show a refill but according to the pharmacy is not, please sent the refill to [facility] on [address], please follow up

## 2017-07-10 NOTE — Telephone Encounter (Signed)
I have already referred her to a bariatric surgeon.  That referral was placed on her last visit.  I have cc Opal Sidles on this message so that she can initiate referral for [facility] services.

## 2017-07-10 NOTE — Telephone Encounter (Signed)
Will forward to pcp

## 2017-07-10 NOTE — Telephone Encounter (Signed)
Refilled

## 2017-07-14 ENCOUNTER — Telehealth: Payer: Self-pay | Admitting: Internal Medicine

## 2017-07-14 NOTE — Telephone Encounter (Signed)
[facility] called to request an order for a   portacath heparin flush. Asked to return call if approved or not at [phone] Please follow up.

## 2017-07-16 ENCOUNTER — Ambulatory Visit (HOSPITAL_COMMUNITY)
Admission: RE | Admit: 2017-07-16 | Discharge: 2017-07-16 | Disposition: A | Discharge status: Home / Self Care | Payer: Medicare HMO | Source: Ambulatory Visit | Attending: Internal Medicine | Admitting: Internal Medicine

## 2017-07-16 ENCOUNTER — Other Ambulatory Visit: Payer: Self-pay

## 2017-07-16 ENCOUNTER — Encounter (HOSPITAL_COMMUNITY): Payer: Self-pay | Admitting: Internal Medicine

## 2017-07-16 ENCOUNTER — Telehealth: Payer: Self-pay | Admitting: Internal Medicine

## 2017-07-16 ENCOUNTER — Other Ambulatory Visit (HOSPITAL_COMMUNITY): Payer: Self-pay

## 2017-07-16 VITALS — BP 145/75 | HR 98 | Wt 337.8 lb

## 2017-07-16 DIAGNOSIS — G4733 Obstructive sleep apnea (adult) (pediatric): Secondary | ICD-10-CM | POA: Insufficient documentation

## 2017-07-16 DIAGNOSIS — I471 Supraventricular tachycardia: Secondary | ICD-10-CM | POA: Insufficient documentation

## 2017-07-16 DIAGNOSIS — F329 Major depressive disorder, single episode, unspecified: Secondary | ICD-10-CM | POA: Diagnosis not present

## 2017-07-16 DIAGNOSIS — M797 Fibromyalgia: Secondary | ICD-10-CM | POA: Diagnosis not present

## 2017-07-16 DIAGNOSIS — Z452 Encounter for adjustment and management of vascular access device: Secondary | ICD-10-CM | POA: Diagnosis not present

## 2017-07-16 DIAGNOSIS — Z7901 Long term (current) use of anticoagulants: Secondary | ICD-10-CM | POA: Insufficient documentation

## 2017-07-16 DIAGNOSIS — Z79899 Other long term (current) drug therapy: Secondary | ICD-10-CM | POA: Diagnosis not present

## 2017-07-16 DIAGNOSIS — R42 Dizziness and giddiness: Secondary | ICD-10-CM | POA: Insufficient documentation

## 2017-07-16 DIAGNOSIS — E785 Hyperlipidemia, unspecified: Secondary | ICD-10-CM | POA: Insufficient documentation

## 2017-07-16 DIAGNOSIS — Z86711 Personal history of pulmonary embolism: Secondary | ICD-10-CM | POA: Diagnosis not present

## 2017-07-16 DIAGNOSIS — M5416 Radiculopathy, lumbar region: Secondary | ICD-10-CM

## 2017-07-16 DIAGNOSIS — I2724 Chronic thromboembolic pulmonary hypertension: Secondary | ICD-10-CM

## 2017-07-16 DIAGNOSIS — G894 Chronic pain syndrome: Secondary | ICD-10-CM | POA: Insufficient documentation

## 2017-07-16 DIAGNOSIS — R0600 Dyspnea, unspecified: Secondary | ICD-10-CM

## 2017-07-16 DIAGNOSIS — I493 Ventricular premature depolarization: Secondary | ICD-10-CM | POA: Insufficient documentation

## 2017-07-16 DIAGNOSIS — J9610 Chronic respiratory failure, unspecified whether with hypoxia or hypercapnia: Secondary | ICD-10-CM | POA: Diagnosis not present

## 2017-07-16 DIAGNOSIS — I10 Essential (primary) hypertension: Secondary | ICD-10-CM | POA: Diagnosis not present

## 2017-07-16 DIAGNOSIS — R0902 Hypoxemia: Secondary | ICD-10-CM

## 2017-07-16 DIAGNOSIS — R079 Chest pain, unspecified: Secondary | ICD-10-CM | POA: Diagnosis not present

## 2017-07-16 DIAGNOSIS — R0602 Shortness of breath: Secondary | ICD-10-CM | POA: Diagnosis not present

## 2017-07-16 DIAGNOSIS — F419 Anxiety disorder, unspecified: Secondary | ICD-10-CM | POA: Insufficient documentation

## 2017-07-16 NOTE — Telephone Encounter (Signed)
I did hear back from radiologist Dr. Magdalen Spatz who read the lumbar MRI regarding my question of whether repeating MRI with contrast would made a difference given findings on MRI without contrast did not match up with pt's symptoms. Thought contrast study would add very little. Some compression is from dynamic motion which MRI may not detect. Radiologist reports MRI without contrast best for eval nerve compression symptoms. Contrast study best when you suspect tumor, infection, neuritis or surgical complication.   Pt has contacted her insurance and reports they will pay for [facility] services for her. I will submitted referral.

## 2017-07-16 NOTE — Patient Instructions (Signed)
Decrease Adempas to 1 mg Three times a day   You have been referred to [name] [name] for home oxygen and home pulmonary rehab, they will call you in a day or 2 to arrange.  Your physician recommends that you schedule a follow-up appointment in: We will call you to schedule

## 2017-07-16 NOTE — Progress Notes (Addendum)
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   Patient was in her [place] until 08/2015 when she started getting symptoms of dyspnea on exertion, dizziness, bilateral LE swelling, and constant R>L non-specific chest pain. These symptoms progressively worsened over several days prompting her to go to [facility] in her then-[place]ometown of [place] in 09/2015 where she was diagnosed with PE and started on anticoagulation. She was very briefly on warfarin but quickly switched to Xarelto. She was referred to [facility] in 12/2015. chocardiogram (12-2015) showed normal LVEF 55-60%, normal LA, normal RV size, RVSP estimated as 70 mm Hg. She further was referred to [facility] and saw Dr. Karena Addison on 03/14/2016 where patient had several studies including V/Q scan which found high prob for PE; studies c/w CTEPH. It was felt that her chronic chest pain was caused by chronic PE. RHC/LHC/Pulm Angiogram 03/2016 confirmed diagnosis of CTEPH. She underwent pulmonary thromboembolectomy 03/31/2016 at [facility] with IVC filter placement. Pre-op cath with no CAD.   Seen by Dr. Karena Addison in 04/2016 for follow up. He felt like her heart size was decreasing. Plan was for lifelong [place] and ECHO and V/Q in 4 months. Dr. Karena Addison has since left the [facility] practice.  CT angio at [facility] on 05/15/16 showed " persistent but partially recanalized PE within right lower lobe pulmonary artery. No new PE identified. "  She was admitted to [facility] in 06/2016 for continued chest pain and parasthesias. V/Q scan on 06/27/16 showed prominent ventilation perfusion mismatch in right c/w chronic PE. Head CT  negative. She was continued on Xarelto and told to follow up with [facility]. Patient doesn't want to have to travel to [facility] for care so [place] care here in [place].   Echo 10/07/16. EF 55%. RV normal RVSP 58mHG  Presents today for follow up. Continues to feel bad. Says she is SOB with any activity. Continues to swell a lot. Says her hands are tight. Followed by Paramedicine. Takes lasix 40 mg/day. Doubles up about 2x/week. Says she is watching her portions. Using Ms. Dash. Trying to cut down on liquids. Has gained 10 pounds. Gets dizzy frequently. Lots of family stressor with mother sick with renal failure. Adempas recently cut back. Now on 221mtid. SBP tends to be in the 90s at home.Wears CPAP at night.   Monitor 06/30/17 Sinus rhythm/sinus tach with occasional PACs/PVCs and very brief runs (< 5 beats) SVT.    ECHO 8/18: EF 55-60%  RV normal RVSP 2043m   10/2016 RHC RA = 8 RV = 64/13 PA = 65/19 (35) PCW = 9 Fick cardiac output/index = 7.5/3.1 PVR = 3.5 WU Ao sat = 96% PA sat = 68%,70% SVC sat = 71% Assessment: 1. Mild residual PAH in the setting of CTEPH s/p pulmonary thrombolectomy 2. Normal cardiac output 3. Normal left-sided filling pressures  Past Medical History:  Diagnosis Date  . Arthritis   . Depression   . Diverticulitis   . Hyperlipidemia   . Hypertension   . Obesity   . Pneumonia   . [facility]   Past Surgical  History:  Procedure Laterality Date  . ABDOMINAL HYSTERECTOMY    . BREAST REDUCTION SURGERY    . CHOLECYSTECTOMY    . EMBOLECTOMY N/A    pulmonary embolectomy  . REDUCTION MAMMAPLASTY Bilateral     Current Medications:  Current Outpatient Medications on File Prior to Encounter  Medication Sig Dispense Refill  . acetaminophen-codeine (TYLENOL #4) 300-60 MG tablet Take 1 tablet by mouth every 8 (eight) hours as needed for moderate pain. 70 tablet 1  . albuterol (PROVENTIL HFA;VENTOLIN HFA) 108 (90 Base) MCG/ACT inhaler  Inhale 1-2 puffs into the lungs every 6 (six) hours as needed for wheezing or shortness of breath. 6.7 g 5  . amitriptyline (ELAVIL) 10 MG tablet Take 1 tablet (10 mg total) by mouth at bedtime. 30 tablet 2  . atorvastatin (LIPITOR) 20 MG tablet Take 1 tablet (20 mg total) by mouth daily at 6 PM. 90 tablet 2  . cyclobenzaprine (FLEXERIL) 5 MG tablet TAKE 1 TABLET BY MOUTH THREE TIMES DAILY AS NEEDED FOR MUSCLE SPASM 30 tablet 0  . docusate sodium (COLACE) 100 MG capsule Take 1 capsule (100 mg total) by mouth every 12 (twelve) hours. 30 capsule 0  . ferrous sulfate (FERROUSUL) 325 (65 FE) MG tablet Take 1 tablet (325 mg total) by mouth daily with breakfast. 90 tablet 3  . fluticasone (FLONASE) 50 MCG/ACT nasal spray Place 2 sprays into both nostrils daily. 16 g 6  . furosemide (LASIX) 40 MG tablet Take 1 tablet (40 mg total) by mouth daily. Take extra 40 mg tablet once in the afternoon AS NEEDED for weight gain 3 lbs or more. 60 tablet 3  . gabapentin (NEURONTIN) 300 MG capsule Take 2 capsules (600 mg total) by mouth 3 (three) times daily. 180 capsule 3  . loratadine (CLARITIN) 10 MG tablet Take 1 tablet (10 mg total) by mouth daily. 30 tablet 11  . meclizine (ANTIVERT) 25 MG tablet TAKE ONE TABLET BY MOUTH THREE TIMES DAILY AS NEEDED FOR  DIZZINESS 60 tablet 0  . omeprazole (PRILOSEC) 20 MG capsule Take 1 capsule (20 mg total) by mouth daily. 30 capsule 3  . potassium chloride (K-DUR,KLOR-CON) 10 MEQ tablet Take 20 mEq by mouth 2 (two) times daily.    . Riociguat (ADEMPAS) 2 MG TABS Take 2 mg by mouth 3 (three) times daily.    . rivaroxaban (XARELTO) 20 MG TABS tablet Take 1 tablet (20 mg total) by mouth daily with supper. 90 tablet 3   No current facility-administered medications on file prior to encounter.     Allergies:   [name].   Social History   Socioeconomic History  . Marital status: Married    Spouse name: None  . Number of children: None  . Years of  education: None  . Highest education level: None  Social Needs  . Financial resource strain: None  . Food insecurity - worry: None  . Food insecurity - inability: None  . Transportation needs - medical: None  . Transportation needs - non-medical: None  Occupational History  . None  Tobacco Use  . Smoking status: Never Smoker  . Smokeless tobacco: Never Used  Substance and Sexual Activity  . Alcohol use: No  . Drug use: No  . Sexual activity: Yes    Partners: Male    Birth control/protection: Surgical  Other Topics Concern  . None  Social History Narrative  . None     Family History:  The patient's family history includes  Anxiety disorder in her cousin, sister, and sister; Bipolar disorder in her sister and sister; Dementia in her father and mother; Drug abuse in her sister and sister; Lung cancer in her maternal grandmother; Sexual abuse in her maternal aunt.      ROS:   Please see the history of present illness.    ROS All other systems reviewed and are negative.   PHYSICAL EXAM:   VS:  BP (!) 145/75   Pulse 98   Wt (!) 337 lb 12.8 oz (153.2 kg)   SpO2 99%   BMI 59.84 kg/m     General:  Obese woman No resp difficulty. Tearful throughout  HEENT: normal Neck: supple. Hard to assess JVP with body habitus appears 7-8 Carotids 2+ bilat; no bruits. No lymphadenopathy or thryomegaly appreciated. Cor: PMI nonpalpable. Distant HS. Regular rate & rhythm. 2/6 TR Lungs: clear Abdomen: obese soft, nontender, nondistended. Unable to appreciate any hepatosplenomegaly. No bruits or masses. Good bowel sounds. Extremities: no cyanosis, clubbing, rash, trace edema Neuro: alert & orientedx3, cranial nerves grossly intact. moves all 4 extremities w/o difficulty. Affect pleasant but tearful   Wt Readings from Last 3 Encounters:  07/16/17 (!) 337 lb 12.8 oz (153.2 kg)  07/04/17 (!) 328 lb (148.8 kg)  06/30/17 (!) 336 lb 6.4 oz (152.6 kg)      Studies/Labs Reviewed:    Recent  Labs: 12/19/2016: ALT 10 06/02/2017: B Natriuretic Peptide 13.5; BUN 12; Creatinine, Ser 0.88; Hemoglobin 12.2; Platelets 281; Potassium 3.3; Sodium 141   Lipid Panel    Component Value Date/Time   CHOL 166 06/27/2016 0346   TRIG 125 06/27/2016 0346   HDL 33 (L) 06/27/2016 0346   CHOLHDL 5.0 06/27/2016 0346   VLDL 25 06/27/2016 0346   LDLCALC 108 (H) 06/27/2016 0346    Additional studies/ records that were reviewed today include:   ECHO 8/18: EF 55-60%  RV normal RVSP 35mHG  EF 55-60%  RV normal RVSP 282mG   2D ECHO: 12/15/2015 LV EF: 55% -   60% Study Conclusions - Left ventricle: The cavity size was normal. Wall thickness was normal. Systolic function was normal. The estimated ejection  fraction was in the range of 55% to 60%. - Pulmonary arteries: PA peak pressure: 70 mm Hg (S). - Pericardium, extracardiac: A trivial pericardial effusion was   identified.   2D ECHO 04/05/16 INTERPRETATION:NORMAL LEFT VENTRICULAR FUNCTION WITH MILD LVHSEVERE RV SYSTOLIC DYSFUNCTION (See above) VALVULAR REGURGITATION: TRIVIAL PR, MILD TR NO VALVULAR STENOSIS TRIVIAL PERICARDIAL EFFUSION Compared with prior Echo study on 03/14/2016: RVSP increased from 50 to 69   CATH right heart and coronary angiography: 03/25/2016 [facility] Name Value Ref Range  Cardiac Index (l/min/m2) 2.3 L/min/m2  Right Atrium Mean Pressure (mmHg) 11 mmHg   Right Ventricle Systolic Pressure (mmHg) 68 mmHg   Pulmonary Artery Mean Pressure (mmHg) 40 mmHg   Pulmonary Wedge Pressure (mmHg) 14 mmHg   Pulmonary Vascular Resistance (Wood units) 4.7    CT angio 05/15/16 IMPRESSION: Persistent but partially recanalized pulmonary embolus within the right lower lobe pulmonary artery. No new pulmonary embolism is identified. Minimal bibasilar atelectatic changes stable from the prior exam.   V/Q scan 06/27/16 IMPRESSION: Prominent ventilation perfusion mismatch noted on the right.  Finding suggests high probability right-sided pulmonary embolus in this patient with known right-sided pulmonary embolus.   ASSESSMENT & PLAN:   1. 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 PCW 9 - Echo 8/18 with no evidence RV strain or PAH (RVSP estimated 34mHG)  [facility] " persistent but partially recanalized PE within right lower lobe pulmonary artery. No new PE identified.  - VQ 10/17 positive for residual PE right lateral lung - Adempas previously decreased. Having trouble tolerating Will decrease adempas to 1 tid and reassess symptoms (both dizziness and SOB). Can consider macitentan based on [facility] trial however I suspect residual chronic PE may not be major driving force in her symptoms. - Hall walk today with sats down to 79%. -> will order home O2 - Long talk aboutabsolute need to lose weight  2. Dizziness/palpitations - Continues to be an issue for her. 48 hour monitor without arrhythmia.  - Will cut adempas back to 167mtid and see if this helps  3. Chronic respiratory failure - start home O2. - encouraged weight loss. - refer [facility] home Pulmonary/Cardiac rehab program  4. Fibromyalgia/Rheumatoid Arthitis:  - Follows with Rheumatology.   5. Morbid obesity with OHS:  - Discussed need for weight loss  6. OSA - Compliant with CPAP.    7. Depression/anxiety - Major issue for her.  -She met with SW today and discussed resources  Total time spent 45 minutes. Over half that time spent discussing above.   BeGlori BickersD 11:04 AM

## 2017-07-16 NOTE — Progress Notes (Signed)
Clinic visit:   [name] [name] was seen at the clinic today with the provider and reports having stress at home due to her grandson's behavioral problems and her mother's health is failing. She has significant worry about her weight and not being able to afford the bariatric program. Adempas is reduced to 1 mg TID and not adding anything. Oxygen saturation dropped to 80% while walking and HR increased to 130. Adding home oxygen and pulmonary rehab. Home health with in-home rehabilitation being started.   Time spent with patient: 70 minutes

## 2017-07-16 NOTE — Progress Notes (Signed)
SATURATION QUALIFICATIONS: (This note is used to comply with regulatory documentation for home oxygen)  Patient Saturations on Room Air at Rest = 99%  Patient Saturations on Room Air while Ambulating = 79%  Patient Saturations on 2 Liters of oxygen while Ambulating = 90%  Please briefly explain why patient needs home oxygen: hypoxia with ambulation, pulmonary hypertension

## 2017-07-16 NOTE — Telephone Encounter (Signed)
[name] call to check on the statu of the order for  for a   portacath heparin flush. Asked to return call if approved or not at [phone] Please follow up.
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
## 2017-07-17 ENCOUNTER — Telehealth: Payer: Self-pay

## 2017-07-17 NOTE — Telephone Encounter (Signed)
Please place order for:  Heparin flush 10 ml every 30 days in Epic.

## 2017-07-17 NOTE — Telephone Encounter (Addendum)
Home health referral received from Dr [name].  Call placed to the patient to inform her of the referral and to inquire if she has a preference for home health agencies.  Also explained that the agency has to be contracted with [employer]. The patient stated that she understood had no preference for agencies.  The patient noted that she wants Dr [name] to be notified that Dr Haroldine Laws started her on O2 @ 2L prn yesterday and has ordered pulmonary rehab at home.   [facility] placed to [facility] # [phone] and spoke to [place]. She stated that they are contracted with [employer] but will need to confirm the patient's home health benefit. She requested that the referral be faxed for review to fax # [phone].  Referral faxed as requested.

## 2017-07-18 ENCOUNTER — Telehealth: Payer: Self-pay | Admitting: Internal Medicine

## 2017-07-18 ENCOUNTER — Other Ambulatory Visit: Payer: Self-pay | Admitting: Internal Medicine

## 2017-07-18 DIAGNOSIS — I2782 Chronic pulmonary embolism: Secondary | ICD-10-CM

## 2017-07-18 MED ORDER — HEPARIN SOD (PORK) LOCK FLUSH 10 UNIT/ML IV SOLN: 500.0000 mL | INTRAVENOUS | Status: DC

## 2017-07-18 NOTE — Telephone Encounter (Signed)
Home health called to say they start service on Sunday.

## 2017-07-21 ENCOUNTER — Other Ambulatory Visit: Payer: Self-pay

## 2017-07-21 DIAGNOSIS — G8191 Hemiplegia, unspecified affecting right dominant side: Secondary | ICD-10-CM | POA: Diagnosis not present

## 2017-07-21 DIAGNOSIS — M5416 Radiculopathy, lumbar region: Secondary | ICD-10-CM | POA: Diagnosis not present

## 2017-07-21 DIAGNOSIS — I2724 Chronic thromboembolic pulmonary hypertension: Secondary | ICD-10-CM | POA: Diagnosis not present

## 2017-07-21 DIAGNOSIS — M069 Rheumatoid arthritis, unspecified: Secondary | ICD-10-CM | POA: Diagnosis not present

## 2017-07-21 DIAGNOSIS — G894 Chronic pain syndrome: Secondary | ICD-10-CM | POA: Diagnosis not present

## 2017-07-21 DIAGNOSIS — I2699 Other pulmonary embolism without acute cor pulmonale: Secondary | ICD-10-CM | POA: Diagnosis not present

## 2017-07-22 ENCOUNTER — Telehealth: Payer: Self-pay

## 2017-07-22 ENCOUNTER — Telehealth: Payer: Self-pay | Admitting: Internal Medicine

## 2017-07-22 NOTE — Telephone Encounter (Signed)
Call placed to [facility] # [phone]  to confirm acceptance of the referral.  Voicemail message left for [place] requesting a call back to # [phone],

## 2017-07-22 NOTE — Telephone Encounter (Signed)
fluid management and medication teaching 2 times for 4 weeks 1 time a week for 5 weeks approved by [place]

## 2017-07-22 NOTE — Telephone Encounter (Signed)
Call received from [place], [place] at [place] who confirmed that they accepted the referral and start of care was 07/21/17

## 2017-07-23 ENCOUNTER — Encounter (HOSPITAL_COMMUNITY): Admission: RE | Admit: 2017-07-23 | Payer: Medicare HMO | Source: Ambulatory Visit

## 2017-07-23 ENCOUNTER — Other Ambulatory Visit (HOSPITAL_COMMUNITY): Payer: Self-pay

## 2017-07-23 ENCOUNTER — Encounter (HOSPITAL_COMMUNITY): Payer: Self-pay

## 2017-07-23 NOTE — Progress Notes (Signed)
Paramedicine Encounter    Patient ID: [name] [name], female    DOB: 01-Oct-1960, 56 y.o.   MRN: [id number]   Patient Care Team: Ladell Pier, MD as PCP - General (Internal Medicine) Loletta Specter, RN as [place] Management  Patient Active Problem List   Diagnosis Date Noted  . Chronic cough 04/10/2017  . Rheumatoid arthritis involving multiple sites ([place]) 04/10/2017  . Anxiety and depression 04/10/2017  . Adjustment disorder with depressed mood 12/11/2016  . GAD (generalized anxiety disorder) 08/14/2016  . Morbid obesity ([place]) 08/14/2016  . Stroke ([place]) 06/27/2016  . Right sided weakness 06/27/2016  . Essential hypertension 06/27/2016  . Atypical chest pain 06/27/2016  . Difficult airway 03/29/2016  . CTEPH (chronic thromboembolic pulmonary hypertension) ([place]) 03/14/2016  . OSA on CPAP 02/20/2016  . Chronic pain syndrome 01/31/2016  . Vaginal bleeding 11/20/2015  . Hematochezia 11/20/2015  . Pulmonary embolus ([place]) 11/15/2015  . Vertigo 11/15/2015  . Depression 11/15/2015    Current Outpatient Medications:  .  acetaminophen-codeine (TYLENOL #4) 300-60 MG tablet, Take 1 tablet by mouth every 8 (eight) hours as needed for moderate pain., Disp: 70 tablet, Rfl: 1 .  albuterol (PROVENTIL HFA;VENTOLIN HFA) 108 (90 Base) MCG/ACT inhaler, Inhale 1-2 puffs into the lungs every 6 (six) hours as needed for wheezing or shortness of breath., Disp: 6.7 g, Rfl: 5 .  amitriptyline (ELAVIL) 10 MG tablet, Take 1 tablet (10 mg total) by mouth at bedtime., Disp: 30 tablet, Rfl: 2 .  atorvastatin (LIPITOR) 20 MG tablet, Take 1 tablet (20 mg total) by mouth daily at 6 PM., Disp: 90 tablet, Rfl: 2 .  cyclobenzaprine (FLEXERIL) 5 MG tablet, TAKE 1 TABLET BY MOUTH THREE TIMES DAILY AS NEEDED FOR MUSCLE SPASM, Disp: 30 tablet, Rfl: 0 .  docusate sodium (COLACE) 100 MG capsule, Take 1 capsule (100 mg total) by mouth every 12 (twelve) hours., Disp: 30 capsule, Rfl: 0 .   ferrous sulfate (FERROUSUL) 325 (65 FE) MG tablet, Take 1 tablet (325 mg total) by mouth daily with breakfast., Disp: 90 tablet, Rfl: 3 .  fluticasone (FLONASE) 50 MCG/ACT nasal spray, Place 2 sprays into both nostrils daily., Disp: 16 g, Rfl: 6 .  furosemide (LASIX) 40 MG tablet, Take 1 tablet (40 mg total) by mouth daily. Take extra 40 mg tablet once in the afternoon AS NEEDED for weight gain 3 lbs or more., Disp: 60 tablet, Rfl: 3 .  gabapentin (NEURONTIN) 300 MG capsule, Take 2 capsules (600 mg total) by mouth 3 (three) times daily., Disp: 180 capsule, Rfl: 3 .  loratadine (CLARITIN) 10 MG tablet, Take 1 tablet (10 mg total) by mouth daily., Disp: 30 tablet, Rfl: 11 .  meclizine (ANTIVERT) 25 MG tablet, TAKE ONE TABLET BY MOUTH THREE TIMES DAILY AS NEEDED FOR  DIZZINESS, Disp: 60 tablet, Rfl: 0 .  omeprazole (PRILOSEC) 20 MG capsule, Take 1 capsule (20 mg total) by mouth daily., Disp: 30 capsule, Rfl: 3 .  potassium chloride (K-DUR,KLOR-CON) 10 MEQ tablet, Take 20 mEq by mouth 2 (two) times daily., Disp: , Rfl:  .  Riociguat (ADEMPAS) 2 MG TABS, Take 2 mg by mouth 3 (three) times daily., Disp: , Rfl:  .  rivaroxaban (XARELTO) 20 MG TABS tablet, Take 1 tablet (20 mg total) by mouth daily with supper., Disp: 90 tablet, Rfl: 3  Current Facility-Administered Medications:  .  heparin flush 10 UNIT/ML injection 5,000 Units, 500 mL, Intracatheter, Q30 days, Ladell Pier, MD No Known Allergies  Social History   Socioeconomic History  . Marital status: Married    Spouse name: Not on file  . Number of children: Not on file  . Years of education: Not on file  . Highest education level: Not on file  Social Needs  . Financial resource strain: Not on file  . Food insecurity - worry: Not on file  . Food insecurity - inability: Not on file  . Transportation needs - medical: Not on file  . Transportation needs - non-medical: Not on file  Occupational History  . Not on file  Tobacco Use   . Smoking status: Never Smoker  . Smokeless tobacco: Never Used  Substance and Sexual Activity  . Alcohol use: No  . Drug use: No  . Sexual activity: Yes    Partners: Male    Birth control/protection: Surgical  Other Topics Concern  . Not on file  Social History Narrative  . Not on file    Physical Exam  Constitutional: She is oriented to person, place, and time.  Cardiovascular: Normal rate and regular rhythm.  Pulmonary/Chest: Effort normal and breath sounds normal. No respiratory distress. She has no wheezes. She has no rales.  Musculoskeletal: Normal range of motion. She exhibits edema.  Neurological: She is alert and oriented to person, place, and time.  Skin: Skin is warm and dry.  Psychiatric: She has a normal mood and affect.        Future Appointments  Date Time Provider [place]  08/04/2017  9:30 AM [facility] ROOM [facility] None  08/08/2017 11:15 AM Ladell Pier, MD [facility] None  09/01/2017  8:00 AM [facility] ROOM [facility] None  09/01/2017 10:20 AM Bensimhon, Shaune Pascal, MD [facility] None    BP 110/70 (BP Location: Left Arm, Patient Position: Sitting, Cuff Size: Large)   Pulse 90   Resp 18   Wt (!) 333 lb (151 kg)   SpO2 95%   BMI 58.99 kg/m   Weight yesterday- 333 lb Last visit weight- 110/70  [name] [name] was seen to home today and stated her SOB while walking through the house has improved since being put on oxygen. She is still complaining of dizziness and had an episode of chest pain over the weekend but it passed without intervention. She has swelling in her left arm and bilateral lower extremities. She did not need any medications ordered. All medications were verified and her pillbox was refilled.   Time spent with patient: 39 minutes  [name] [name], EMT 07/23/17  ACTION: Home visit completed Next visit planned for 1 week

## 2017-07-25 DIAGNOSIS — G8191 Hemiplegia, unspecified affecting right dominant side: Secondary | ICD-10-CM | POA: Diagnosis not present

## 2017-07-25 DIAGNOSIS — I2724 Chronic thromboembolic pulmonary hypertension: Secondary | ICD-10-CM | POA: Diagnosis not present

## 2017-07-25 DIAGNOSIS — M5416 Radiculopathy, lumbar region: Secondary | ICD-10-CM | POA: Diagnosis not present

## 2017-07-25 DIAGNOSIS — I2699 Other pulmonary embolism without acute cor pulmonale: Secondary | ICD-10-CM | POA: Diagnosis not present

## 2017-07-25 DIAGNOSIS — M069 Rheumatoid arthritis, unspecified: Secondary | ICD-10-CM | POA: Diagnosis not present

## 2017-07-25 DIAGNOSIS — G894 Chronic pain syndrome: Secondary | ICD-10-CM | POA: Diagnosis not present

## 2017-07-26 DIAGNOSIS — I10 Essential (primary) hypertension: Secondary | ICD-10-CM | POA: Diagnosis not present

## 2017-07-26 DIAGNOSIS — I2724 Chronic thromboembolic pulmonary hypertension: Secondary | ICD-10-CM | POA: Diagnosis not present

## 2017-07-26 DIAGNOSIS — F329 Major depressive disorder, single episode, unspecified: Secondary | ICD-10-CM | POA: Diagnosis not present

## 2017-07-26 DIAGNOSIS — K5793 Diverticulitis of intestine, part unspecified, without perforation or abscess with bleeding: Secondary | ICD-10-CM | POA: Diagnosis not present

## 2017-07-26 DIAGNOSIS — G894 Chronic pain syndrome: Secondary | ICD-10-CM | POA: Diagnosis not present

## 2017-07-26 DIAGNOSIS — M1990 Unspecified osteoarthritis, unspecified site: Secondary | ICD-10-CM | POA: Diagnosis not present

## 2017-07-26 DIAGNOSIS — M069 Rheumatoid arthritis, unspecified: Secondary | ICD-10-CM | POA: Diagnosis not present

## 2017-07-26 DIAGNOSIS — G4733 Obstructive sleep apnea (adult) (pediatric): Secondary | ICD-10-CM | POA: Diagnosis not present

## 2017-07-26 DIAGNOSIS — J9611 Chronic respiratory failure with hypoxia: Secondary | ICD-10-CM | POA: Diagnosis not present

## 2017-07-26 DIAGNOSIS — I110 Hypertensive heart disease with heart failure: Secondary | ICD-10-CM | POA: Diagnosis not present

## 2017-07-28 DIAGNOSIS — G8191 Hemiplegia, unspecified affecting right dominant side: Secondary | ICD-10-CM | POA: Diagnosis not present

## 2017-07-28 DIAGNOSIS — G894 Chronic pain syndrome: Secondary | ICD-10-CM | POA: Diagnosis not present

## 2017-07-28 DIAGNOSIS — M5416 Radiculopathy, lumbar region: Secondary | ICD-10-CM | POA: Diagnosis not present

## 2017-07-28 DIAGNOSIS — M069 Rheumatoid arthritis, unspecified: Secondary | ICD-10-CM | POA: Diagnosis not present

## 2017-07-28 DIAGNOSIS — I2699 Other pulmonary embolism without acute cor pulmonale: Secondary | ICD-10-CM | POA: Diagnosis not present

## 2017-07-28 DIAGNOSIS — I2724 Chronic thromboembolic pulmonary hypertension: Secondary | ICD-10-CM | POA: Diagnosis not present

## 2017-07-29 ENCOUNTER — Other Ambulatory Visit (HOSPITAL_COMMUNITY): Payer: Self-pay | Admitting: Pharmacist

## 2017-07-29 DIAGNOSIS — M1990 Unspecified osteoarthritis, unspecified site: Secondary | ICD-10-CM | POA: Diagnosis not present

## 2017-07-29 DIAGNOSIS — I2724 Chronic thromboembolic pulmonary hypertension: Secondary | ICD-10-CM | POA: Diagnosis not present

## 2017-07-29 DIAGNOSIS — K5793 Diverticulitis of intestine, part unspecified, without perforation or abscess with bleeding: Secondary | ICD-10-CM | POA: Diagnosis not present

## 2017-07-29 DIAGNOSIS — G894 Chronic pain syndrome: Secondary | ICD-10-CM | POA: Diagnosis not present

## 2017-07-29 DIAGNOSIS — M069 Rheumatoid arthritis, unspecified: Secondary | ICD-10-CM | POA: Diagnosis not present

## 2017-07-29 DIAGNOSIS — F329 Major depressive disorder, single episode, unspecified: Secondary | ICD-10-CM | POA: Diagnosis not present

## 2017-07-29 DIAGNOSIS — J9611 Chronic respiratory failure with hypoxia: Secondary | ICD-10-CM | POA: Diagnosis not present

## 2017-07-29 DIAGNOSIS — G4733 Obstructive sleep apnea (adult) (pediatric): Secondary | ICD-10-CM | POA: Diagnosis not present

## 2017-07-29 DIAGNOSIS — I10 Essential (primary) hypertension: Secondary | ICD-10-CM | POA: Diagnosis not present

## 2017-07-29 MED ORDER — RIOCIGUAT 1 MG PO TABS: 1.0000 mg | ORAL_TABLET | Freq: Three times a day (TID) | ORAL | 5 refills | Status: DC

## 2017-07-30 ENCOUNTER — Other Ambulatory Visit (HOSPITAL_COMMUNITY): Payer: Self-pay

## 2017-07-30 ENCOUNTER — Other Ambulatory Visit: Payer: Self-pay | Admitting: Pharmacist

## 2017-07-30 ENCOUNTER — Other Ambulatory Visit: Payer: Self-pay | Admitting: Internal Medicine

## 2017-07-30 DIAGNOSIS — M069 Rheumatoid arthritis, unspecified: Secondary | ICD-10-CM | POA: Diagnosis not present

## 2017-07-30 DIAGNOSIS — G894 Chronic pain syndrome: Secondary | ICD-10-CM | POA: Diagnosis not present

## 2017-07-30 DIAGNOSIS — I2699 Other pulmonary embolism without acute cor pulmonale: Secondary | ICD-10-CM | POA: Diagnosis not present

## 2017-07-30 DIAGNOSIS — G8191 Hemiplegia, unspecified affecting right dominant side: Secondary | ICD-10-CM | POA: Diagnosis not present

## 2017-07-30 DIAGNOSIS — M5416 Radiculopathy, lumbar region: Secondary | ICD-10-CM | POA: Diagnosis not present

## 2017-07-30 DIAGNOSIS — M5442 Lumbago with sciatica, left side: Secondary | ICD-10-CM

## 2017-07-30 DIAGNOSIS — I2724 Chronic thromboembolic pulmonary hypertension: Secondary | ICD-10-CM | POA: Diagnosis not present

## 2017-07-30 MED ORDER — CYCLOBENZAPRINE HCL 5 MG PO TABS: 5.0000 mg | ORAL_TABLET | Freq: Three times a day (TID) | ORAL | 0 refills | Status: DC | PRN

## 2017-07-30 NOTE — Progress Notes (Signed)
Paramedicine Encounter    Patient ID: [name] [name], female    DOB: 05/29/1961, 56 y.o.   MRN: [id number]   Patient Care Team: Ladell Pier, MD as PCP - General (Internal Medicine) Loletta Specter, RN as [place] Management  Patient Active Problem List   Diagnosis Date Noted  . Chronic cough 04/10/2017  . Rheumatoid arthritis involving multiple sites ([place]) 04/10/2017  . Anxiety and depression 04/10/2017  . Adjustment disorder with depressed mood 12/11/2016  . GAD (generalized anxiety disorder) 08/14/2016  . Morbid obesity ([place]) 08/14/2016  . Stroke ([place]) 06/27/2016  . Right sided weakness 06/27/2016  . Essential hypertension 06/27/2016  . Atypical chest pain 06/27/2016  . Difficult airway 03/29/2016  . CTEPH (chronic thromboembolic pulmonary hypertension) ([place]) 03/14/2016  . OSA on CPAP 02/20/2016  . Chronic pain syndrome 01/31/2016  . Vaginal bleeding 11/20/2015  . Hematochezia 11/20/2015  . Pulmonary embolus ([place]) 11/15/2015  . Vertigo 11/15/2015  . Depression 11/15/2015    Current Outpatient Medications:  .  acetaminophen-codeine (TYLENOL #4) 300-60 MG tablet, Take 1 tablet by mouth every 8 (eight) hours as needed for moderate pain., Disp: 70 tablet, Rfl: 1 .  albuterol (PROVENTIL HFA;VENTOLIN HFA) 108 (90 Base) MCG/ACT inhaler, Inhale 1-2 puffs into the lungs every 6 (six) hours as needed for wheezing or shortness of breath., Disp: 6.7 g, Rfl: 5 .  amitriptyline (ELAVIL) 10 MG tablet, Take 1 tablet (10 mg total) by mouth at bedtime., Disp: 30 tablet, Rfl: 2 .  atorvastatin (LIPITOR) 20 MG tablet, Take 1 tablet (20 mg total) by mouth daily at 6 PM., Disp: 90 tablet, Rfl: 2 .  cyclobenzaprine (FLEXERIL) 5 MG tablet, TAKE 1 TABLET BY MOUTH THREE TIMES DAILY AS NEEDED FOR MUSCLE SPASM, Disp: 30 tablet, Rfl: 0 .  docusate sodium (COLACE) 100 MG capsule, Take 1 capsule (100 mg total) by mouth every 12 (twelve) hours., Disp: 30 capsule, Rfl: 0 .   ferrous sulfate (FERROUSUL) 325 (65 FE) MG tablet, Take 1 tablet (325 mg total) by mouth daily with breakfast., Disp: 90 tablet, Rfl: 3 .  fluticasone (FLONASE) 50 MCG/ACT nasal spray, Place 2 sprays into both nostrils daily., Disp: 16 g, Rfl: 6 .  furosemide (LASIX) 40 MG tablet, Take 1 tablet (40 mg total) by mouth daily. Take extra 40 mg tablet once in the afternoon AS NEEDED for weight gain 3 lbs or more., Disp: 60 tablet, Rfl: 3 .  gabapentin (NEURONTIN) 300 MG capsule, Take 2 capsules (600 mg total) by mouth 3 (three) times daily., Disp: 180 capsule, Rfl: 3 .  loratadine (CLARITIN) 10 MG tablet, Take 1 tablet (10 mg total) by mouth daily., Disp: 30 tablet, Rfl: 11 .  meclizine (ANTIVERT) 25 MG tablet, TAKE ONE TABLET BY MOUTH THREE TIMES DAILY AS NEEDED FOR  DIZZINESS, Disp: 60 tablet, Rfl: 0 .  omeprazole (PRILOSEC) 20 MG capsule, Take 1 capsule (20 mg total) by mouth daily., Disp: 30 capsule, Rfl: 3 .  potassium chloride (K-DUR,KLOR-CON) 10 MEQ tablet, Take 20 mEq by mouth 2 (two) times daily., Disp: , Rfl:  .  Riociguat (ADEMPAS) 1 MG TABS, Take 1 mg 3 (three) times daily by mouth., Disp: 90 tablet, Rfl: 5 .  rivaroxaban (XARELTO) 20 MG TABS tablet, Take 1 tablet (20 mg total) by mouth daily with supper., Disp: 90 tablet, Rfl: 3  Current Facility-Administered Medications:  .  heparin flush 10 UNIT/ML injection 5,000 Units, 500 mL, Intracatheter, Q30 days, Ladell Pier, MD No Known  Allergies    Social History   Socioeconomic History  . Marital status: Married    Spouse name: Not on file  . Number of children: Not on file  . Years of education: Not on file  . Highest education level: Not on file  Social Needs  . Financial resource strain: Not on file  . Food insecurity - worry: Not on file  . Food insecurity - inability: Not on file  . Transportation needs - medical: Not on file  . Transportation needs - non-medical: Not on file  Occupational History  . Not on file   Tobacco Use  . Smoking status: Never Smoker  . Smokeless tobacco: Never Used  Substance and Sexual Activity  . Alcohol use: No  . Drug use: No  . Sexual activity: Yes    Partners: Male    Birth control/protection: Surgical  Other Topics Concern  . Not on file  Social History Narrative  . Not on file    Physical Exam  Constitutional: She is oriented to person, place, and time.  Cardiovascular: Normal rate and regular rhythm.  Pulmonary/Chest: Effort normal and breath sounds normal. No respiratory distress. She has no wheezes. She has no rales.  Abdominal: Soft.  Musculoskeletal: Normal range of motion. She exhibits edema.  Neurological: She is alert and oriented to person, place, and time.  Skin: Skin is warm and dry.  Psychiatric: She has a normal mood and affect.        Future Appointments  Date Time Provider [place]  08/04/2017  9:30 AM [facility] ROOM [facility] None  08/08/2017 11:15 AM Ladell Pier, MD [facility] None  09/01/2017  8:00 AM [facility] ROOM [facility] None  09/01/2017 10:20 AM Bensimhon, Shaune Pascal, MD [facility] None    BP 120/68 (BP Location: Left Arm, Patient Position: Sitting, Cuff Size: Large)   Pulse 85   Resp 16   Wt (!) 333 lb (151 kg)   SpO2 98%   BMI 58.99 kg/m   Weight yesterday- Did not weigh Last visit weight- 333 lb  [name] [name] was seen at home today. She stated she was still having a lot dizzy spells even after having her Adempas dose decreased. She also said she felt like she had gained weight in the past few days. She had not weighted yesterday because her mother was taken to the hospital unexpectedly. I contacted the clinic regarding her edema and weight gain and they advised to have her take an extra lasix and potassium. She also asked I inquire about something to take for her dizziness but the clinic did not believe it is related to heart failure and suggested she contact her PCP. She said she is already planning to see an ENT in  the coming weeks so she would ask that doctor. Her medications were verified and her pillbox was refilled.  Time spent with patient: 39 minutes  [name] [name], EMT 07/30/17  ACTION: Home visit completed Next visit planned for 1 week
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
## 2017-08-04 ENCOUNTER — Encounter (HOSPITAL_COMMUNITY): Payer: Self-pay

## 2017-08-04 ENCOUNTER — Encounter (HOSPITAL_COMMUNITY)
Admission: RE | Admit: 2017-08-04 | Discharge: 2017-08-04 | Disposition: A | Discharge status: Home / Self Care | Payer: Medicare HMO | Source: Ambulatory Visit | Attending: Internal Medicine | Admitting: Internal Medicine

## 2017-08-04 DIAGNOSIS — Z09 Encounter for follow-up examination after completed treatment for conditions other than malignant neoplasm: Secondary | ICD-10-CM | POA: Diagnosis not present

## 2017-08-04 MED ORDER — SODIUM CHLORIDE 0.9% FLUSH
10.0000 mL | INTRAVENOUS | Status: DC
  Administered 2017-08-04: 10 mL via INTRAVENOUS

## 2017-08-04 MED ORDER — HEPARIN SOD (PORK) LOCK FLUSH 100 UNIT/ML IV SOLN
500.0000 [IU] | INTRAVENOUS | Status: DC
  Administered 2017-08-04: 500 [IU] via INTRAVENOUS
  Filled 2017-08-04: qty 5

## 2017-08-05 DIAGNOSIS — G8191 Hemiplegia, unspecified affecting right dominant side: Secondary | ICD-10-CM | POA: Diagnosis not present

## 2017-08-05 DIAGNOSIS — K5793 Diverticulitis of intestine, part unspecified, without perforation or abscess with bleeding: Secondary | ICD-10-CM | POA: Diagnosis not present

## 2017-08-05 DIAGNOSIS — G4733 Obstructive sleep apnea (adult) (pediatric): Secondary | ICD-10-CM | POA: Diagnosis not present

## 2017-08-05 DIAGNOSIS — G894 Chronic pain syndrome: Secondary | ICD-10-CM | POA: Diagnosis not present

## 2017-08-05 DIAGNOSIS — M5416 Radiculopathy, lumbar region: Secondary | ICD-10-CM | POA: Diagnosis not present

## 2017-08-05 DIAGNOSIS — I2724 Chronic thromboembolic pulmonary hypertension: Secondary | ICD-10-CM | POA: Diagnosis not present

## 2017-08-05 DIAGNOSIS — I2699 Other pulmonary embolism without acute cor pulmonale: Secondary | ICD-10-CM | POA: Diagnosis not present

## 2017-08-05 DIAGNOSIS — J9611 Chronic respiratory failure with hypoxia: Secondary | ICD-10-CM | POA: Diagnosis not present

## 2017-08-05 DIAGNOSIS — M1990 Unspecified osteoarthritis, unspecified site: Secondary | ICD-10-CM | POA: Diagnosis not present

## 2017-08-05 DIAGNOSIS — I10 Essential (primary) hypertension: Secondary | ICD-10-CM | POA: Diagnosis not present

## 2017-08-05 DIAGNOSIS — M069 Rheumatoid arthritis, unspecified: Secondary | ICD-10-CM | POA: Diagnosis not present

## 2017-08-05 DIAGNOSIS — F329 Major depressive disorder, single episode, unspecified: Secondary | ICD-10-CM | POA: Diagnosis not present

## 2017-08-06 ENCOUNTER — Other Ambulatory Visit (HOSPITAL_COMMUNITY): Payer: Self-pay

## 2017-08-06 DIAGNOSIS — K5793 Diverticulitis of intestine, part unspecified, without perforation or abscess with bleeding: Secondary | ICD-10-CM | POA: Diagnosis not present

## 2017-08-06 DIAGNOSIS — G4733 Obstructive sleep apnea (adult) (pediatric): Secondary | ICD-10-CM | POA: Diagnosis not present

## 2017-08-06 DIAGNOSIS — J9611 Chronic respiratory failure with hypoxia: Secondary | ICD-10-CM | POA: Diagnosis not present

## 2017-08-06 DIAGNOSIS — M069 Rheumatoid arthritis, unspecified: Secondary | ICD-10-CM | POA: Diagnosis not present

## 2017-08-06 DIAGNOSIS — I2724 Chronic thromboembolic pulmonary hypertension: Secondary | ICD-10-CM | POA: Diagnosis not present

## 2017-08-06 DIAGNOSIS — I10 Essential (primary) hypertension: Secondary | ICD-10-CM | POA: Diagnosis not present

## 2017-08-06 DIAGNOSIS — M1990 Unspecified osteoarthritis, unspecified site: Secondary | ICD-10-CM | POA: Diagnosis not present

## 2017-08-06 DIAGNOSIS — F329 Major depressive disorder, single episode, unspecified: Secondary | ICD-10-CM | POA: Diagnosis not present

## 2017-08-06 DIAGNOSIS — G894 Chronic pain syndrome: Secondary | ICD-10-CM | POA: Diagnosis not present

## 2017-08-06 NOTE — Progress Notes (Signed)
Paramedicine Encounter    Patient ID: [name] [name], female    DOB: 05/02/1961, 56 y.o.   MRN: [id number]   Patient Care Team: Ladell Pier, MD as PCP - General (Internal Medicine) Loletta Specter, RN as [place] Management  Patient Active Problem List   Diagnosis Date Noted  . Chronic cough 04/10/2017  . Rheumatoid arthritis involving multiple sites ([place]) 04/10/2017  . Anxiety and depression 04/10/2017  . Adjustment disorder with depressed mood 12/11/2016  . GAD (generalized anxiety disorder) 08/14/2016  . Morbid obesity ([place]) 08/14/2016  . Stroke ([place]) 06/27/2016  . Right sided weakness 06/27/2016  . Essential hypertension 06/27/2016  . Atypical chest pain 06/27/2016  . Difficult airway 03/29/2016  . CTEPH (chronic thromboembolic pulmonary hypertension) ([place]) 03/14/2016  . OSA on CPAP 02/20/2016  . Chronic pain syndrome 01/31/2016  . Vaginal bleeding 11/20/2015  . Hematochezia 11/20/2015  . Pulmonary embolus ([place]) 11/15/2015  . Vertigo 11/15/2015  . Depression 11/15/2015    Current Outpatient Medications:  .  acetaminophen-codeine (TYLENOL #4) 300-60 MG tablet, Take 1 tablet by mouth every 8 (eight) hours as needed for moderate pain., Disp: 70 tablet, Rfl: 1 .  albuterol (PROVENTIL HFA;VENTOLIN HFA) 108 (90 Base) MCG/ACT inhaler, Inhale 1-2 puffs into the lungs every 6 (six) hours as needed for wheezing or shortness of breath., Disp: 6.7 g, Rfl: 5 .  amitriptyline (ELAVIL) 10 MG tablet, Take 1 tablet (10 mg total) by mouth at bedtime., Disp: 30 tablet, Rfl: 2 .  atorvastatin (LIPITOR) 20 MG tablet, Take 1 tablet (20 mg total) by mouth daily at 6 PM., Disp: 90 tablet, Rfl: 2 .  cyclobenzaprine (FLEXERIL) 5 MG tablet, Take 1 tablet (5 mg total) by mouth 3 (three) times daily as needed. for muscle spams, Disp: 30 tablet, Rfl: 0 .  docusate sodium (COLACE) 100 MG capsule, Take 1 capsule (100 mg total) by mouth every 12 (twelve) hours., Disp: 30  capsule, Rfl: 0 .  ferrous sulfate (FERROUSUL) 325 (65 FE) MG tablet, Take 1 tablet (325 mg total) by mouth daily with breakfast., Disp: 90 tablet, Rfl: 3 .  fluticasone (FLONASE) 50 MCG/ACT nasal spray, Place 2 sprays into both nostrils daily., Disp: 16 g, Rfl: 6 .  furosemide (LASIX) 40 MG tablet, Take 1 tablet (40 mg total) by mouth daily. Take extra 40 mg tablet once in the afternoon AS NEEDED for weight gain 3 lbs or more., Disp: 60 tablet, Rfl: 3 .  gabapentin (NEURONTIN) 300 MG capsule, Take 2 capsules (600 mg total) by mouth 3 (three) times daily., Disp: 180 capsule, Rfl: 3 .  loratadine (CLARITIN) 10 MG tablet, Take 1 tablet (10 mg total) by mouth daily., Disp: 30 tablet, Rfl: 11 .  meclizine (ANTIVERT) 25 MG tablet, TAKE ONE TABLET BY MOUTH THREE TIMES DAILY AS NEEDED FOR  DIZZINESS, Disp: 60 tablet, Rfl: 0 .  omeprazole (PRILOSEC) 20 MG capsule, Take 1 capsule (20 mg total) by mouth daily., Disp: 30 capsule, Rfl: 3 .  potassium chloride (K-DUR,KLOR-CON) 10 MEQ tablet, Take 20 mEq by mouth 2 (two) times daily., Disp: , Rfl:  .  Riociguat (ADEMPAS) 1 MG TABS, Take 1 mg 3 (three) times daily by mouth., Disp: 90 tablet, Rfl: 5 .  rivaroxaban (XARELTO) 20 MG TABS tablet, Take 1 tablet (20 mg total) by mouth daily with supper., Disp: 90 tablet, Rfl: 3  Current Facility-Administered Medications:  .  heparin flush 10 UNIT/ML injection 5,000 Units, 500 mL, Intracatheter, Q30 days, Karle Plumber  B, MD No Known Allergies    Social History   Socioeconomic History  . Marital status: Married    Spouse name: Not on file  . Number of children: Not on file  . Years of education: Not on file  . Highest education level: Not on file  Social Needs  . Financial resource strain: Not on file  . Food insecurity - worry: Not on file  . Food insecurity - inability: Not on file  . Transportation needs - medical: Not on file  . Transportation needs - non-medical: Not on file  Occupational History   . Not on file  Tobacco Use  . Smoking status: Never Smoker  . Smokeless tobacco: Never Used  Substance and Sexual Activity  . Alcohol use: No  . Drug use: No  . Sexual activity: Yes    Partners: Male    Birth control/protection: Surgical  Other Topics Concern  . Not on file  Social History Narrative  . Not on file    Physical Exam  Constitutional: She is oriented to person, place, and time.  Cardiovascular: Normal rate and regular rhythm.  Pulmonary/Chest: Effort normal and breath sounds normal. No respiratory distress. She has no wheezes. She has no rales.  Abdominal: Soft.  Musculoskeletal: Normal range of motion. She exhibits edema.  Neurological: She is alert and oriented to person, place, and time.  Skin: Skin is warm and dry.  Psychiatric: She has a normal mood and affect.        Future Appointments  Date Time Provider [place]  08/08/2017 11:15 AM Ladell Pier, MD [facility] None  09/01/2017  8:00 AM [facility] ROOM [facility] None  09/01/2017 10:20 AM Bensimhon, Shaune Pascal, MD [facility] None    BP 106/82 (BP Location: Right Arm, Patient Position: Sitting, Cuff Size: Large)   Pulse 84   Resp 16   Wt (!) 330 lb 6.4 oz (149.9 kg)   SpO2 97%   BMI 58.53 kg/m   Weight yesterday- 332 lb Last visit weight- 333 lb  [place] with [facility] was contacted and advised that [name] [name] was receiving services from [facility]. [place] advised they would put in the discharge paperwork. [name] [name] said she would let Dr [name] know as well. She is currently waiting on a call from the spine specialist and ENT to schedule her. Medications were verified and her pillbox was refilled. Pictures of [name] [name]'s bathroom have been sent to [facility] so she can figure out grab bars.   Time spent with patient: 36 minutes  [name], EMT 08/06/17  ACTION: Home visit completed Next visit planned for 1 week

## 2017-08-07 DIAGNOSIS — J9611 Chronic respiratory failure with hypoxia: Secondary | ICD-10-CM | POA: Diagnosis not present

## 2017-08-07 DIAGNOSIS — G4733 Obstructive sleep apnea (adult) (pediatric): Secondary | ICD-10-CM | POA: Diagnosis not present

## 2017-08-07 DIAGNOSIS — F329 Major depressive disorder, single episode, unspecified: Secondary | ICD-10-CM | POA: Diagnosis not present

## 2017-08-07 DIAGNOSIS — G894 Chronic pain syndrome: Secondary | ICD-10-CM | POA: Diagnosis not present

## 2017-08-07 DIAGNOSIS — I2724 Chronic thromboembolic pulmonary hypertension: Secondary | ICD-10-CM | POA: Diagnosis not present

## 2017-08-07 DIAGNOSIS — K5793 Diverticulitis of intestine, part unspecified, without perforation or abscess with bleeding: Secondary | ICD-10-CM | POA: Diagnosis not present

## 2017-08-07 DIAGNOSIS — M1990 Unspecified osteoarthritis, unspecified site: Secondary | ICD-10-CM | POA: Diagnosis not present

## 2017-08-07 DIAGNOSIS — I10 Essential (primary) hypertension: Secondary | ICD-10-CM | POA: Diagnosis not present

## 2017-08-07 DIAGNOSIS — M069 Rheumatoid arthritis, unspecified: Secondary | ICD-10-CM | POA: Diagnosis not present

## 2017-08-08 ENCOUNTER — Encounter: Payer: Self-pay | Admitting: Internal Medicine

## 2017-08-08 ENCOUNTER — Ambulatory Visit: Payer: Medicare HMO | Attending: Internal Medicine | Admitting: Internal Medicine

## 2017-08-08 VITALS — BP 105/73 | HR 89 | Temp 98.2°F | Resp 18 | Ht 63.0 in | Wt 334.6 lb

## 2017-08-08 DIAGNOSIS — Z6841 Body Mass Index (BMI) 40.0 and over, adult: Secondary | ICD-10-CM | POA: Diagnosis not present

## 2017-08-08 DIAGNOSIS — Z7901 Long term (current) use of anticoagulants: Secondary | ICD-10-CM | POA: Insufficient documentation

## 2017-08-08 DIAGNOSIS — F329 Major depressive disorder, single episode, unspecified: Secondary | ICD-10-CM | POA: Insufficient documentation

## 2017-08-08 DIAGNOSIS — M5416 Radiculopathy, lumbar region: Secondary | ICD-10-CM | POA: Diagnosis not present

## 2017-08-08 DIAGNOSIS — F32A Depression, unspecified: Secondary | ICD-10-CM

## 2017-08-08 DIAGNOSIS — G4733 Obstructive sleep apnea (adult) (pediatric): Secondary | ICD-10-CM | POA: Insufficient documentation

## 2017-08-08 DIAGNOSIS — Z7951 Long term (current) use of inhaled steroids: Secondary | ICD-10-CM | POA: Diagnosis not present

## 2017-08-08 DIAGNOSIS — Z90710 Acquired absence of both cervix and uterus: Secondary | ICD-10-CM | POA: Diagnosis not present

## 2017-08-08 DIAGNOSIS — Z79899 Other long term (current) drug therapy: Secondary | ICD-10-CM | POA: Insufficient documentation

## 2017-08-08 DIAGNOSIS — I10 Essential (primary) hypertension: Secondary | ICD-10-CM | POA: Diagnosis not present

## 2017-08-08 DIAGNOSIS — H9201 Otalgia, right ear: Secondary | ICD-10-CM | POA: Insufficient documentation

## 2017-08-08 DIAGNOSIS — Z9981 Dependence on supplemental oxygen: Secondary | ICD-10-CM | POA: Insufficient documentation

## 2017-08-08 DIAGNOSIS — Z813 Family history of other psychoactive substance abuse and dependence: Secondary | ICD-10-CM | POA: Insufficient documentation

## 2017-08-08 DIAGNOSIS — Z818 Family history of other mental and behavioral disorders: Secondary | ICD-10-CM | POA: Diagnosis not present

## 2017-08-08 DIAGNOSIS — G894 Chronic pain syndrome: Secondary | ICD-10-CM | POA: Diagnosis not present

## 2017-08-08 DIAGNOSIS — R490 Dysphonia: Secondary | ICD-10-CM | POA: Insufficient documentation

## 2017-08-08 DIAGNOSIS — I2724 Chronic thromboembolic pulmonary hypertension: Secondary | ICD-10-CM | POA: Diagnosis not present

## 2017-08-08 DIAGNOSIS — Z86711 Personal history of pulmonary embolism: Secondary | ICD-10-CM | POA: Diagnosis not present

## 2017-08-08 DIAGNOSIS — M069 Rheumatoid arthritis, unspecified: Secondary | ICD-10-CM | POA: Insufficient documentation

## 2017-08-08 DIAGNOSIS — F419 Anxiety disorder, unspecified: Secondary | ICD-10-CM | POA: Insufficient documentation

## 2017-08-08 DIAGNOSIS — Z9049 Acquired absence of other specified parts of digestive tract: Secondary | ICD-10-CM | POA: Insufficient documentation

## 2017-08-08 DIAGNOSIS — M797 Fibromyalgia: Secondary | ICD-10-CM | POA: Insufficient documentation

## 2017-08-08 NOTE — Progress Notes (Signed)
Patient ID: [name] [name], female    DOB: 11-01-1960  MRN: [id number]  CC: Follow-up   Subjective: [name] [name] is a 56 y.o. female who presents for chronic disease management. Her concerns today include:  56 year old female with history of HTN, morbid obesity, PAH due to CTEPHstatus post thromboembolectomy 7/17 with IVC filter placement and now on [place], OSA, anxiety/depression, chronic pain syndrome with fibromyalgia and questionable RA, chronic chest pain, chronic anemia on iron,  1. LBP:  pain more tolerable with tylenol #4 and Flexeril.  Now has some home services. [facility] RN comes out 2 x a wk. Also has an Biomedical engineer from [place] to fill meds -has a shower chair and getting bars for her bathroom -home P.T 2 x a wk -prescribe O2 2 LT with ambulation by Dr. Haroldine Laws on visit earlier this mth. Pt found to be hypoxic with O2 levels at 79 on 6-minute walk.  She feels more energetic when using the oxygen for ambulation  2.  Depression/anxiety: Mentally she feels she is in a better place at this time.  She appreciates the help she is getting at home  2. Obesity: Medicare does not cover referral to nutritionist. No appt as yet for general surgery to be consider for wgh reduction surgery -She is working on eating smaller more frequent meals.  Trying to be more mobile with home physical therapy  3. Pain in RT ear that radiates down to collar bone times several days.  No drainage from the ear.  4.  Hoarseness: She has not heard from our referral office as yet with appointment for ENT.   Patient Active Problem List   Diagnosis Date Noted  . Chronic cough 04/10/2017  . Rheumatoid arthritis involving multiple sites ([place]) 04/10/2017  . Anxiety and depression 04/10/2017  . Adjustment disorder with depressed mood 12/11/2016  . GAD (generalized anxiety disorder) 08/14/2016  . Morbid obesity ([place]) 08/14/2016  . Stroke ([place]) 06/27/2016  . Right sided weakness 06/27/2016  .  Essential hypertension 06/27/2016  . Atypical chest pain 06/27/2016  . Difficult airway 03/29/2016  . CTEPH (chronic thromboembolic pulmonary hypertension) ([place]) 03/14/2016  . OSA on CPAP 02/20/2016  . Chronic pain syndrome 01/31/2016  . Vaginal bleeding 11/20/2015  . Hematochezia 11/20/2015  . Pulmonary embolus ([place]) 11/15/2015  . Vertigo 11/15/2015  . Depression 11/15/2015     Current Outpatient Medications on File Prior to Visit  Medication Sig Dispense Refill  . acetaminophen-codeine (TYLENOL #4) 300-60 MG tablet Take 1 tablet by mouth every 8 (eight) hours as needed for moderate pain. 70 tablet 1  . albuterol (PROVENTIL HFA;VENTOLIN HFA) 108 (90 Base) MCG/ACT inhaler Inhale 1-2 puffs into the lungs every 6 (six) hours as needed for wheezing or shortness of breath. 6.7 g 5  . amitriptyline (ELAVIL) 10 MG tablet Take 1 tablet (10 mg total) by mouth at bedtime. 30 tablet 2  . atorvastatin (LIPITOR) 20 MG tablet Take 1 tablet (20 mg total) by mouth daily at 6 PM. 90 tablet 2  . cyclobenzaprine (FLEXERIL) 5 MG tablet Take 1 tablet (5 mg total) by mouth 3 (three) times daily as needed. for muscle spams 30 tablet 0  . docusate sodium (COLACE) 100 MG capsule Take 1 capsule (100 mg total) by mouth every 12 (twelve) hours. 30 capsule 0  . ferrous sulfate (FERROUSUL) 325 (65 FE) MG tablet Take 1 tablet (325 mg total) by mouth daily with breakfast. 90 tablet 3  . fluticasone (FLONASE) 50 MCG/ACT nasal  spray Place 2 sprays into both nostrils daily. 16 g 6  . furosemide (LASIX) 40 MG tablet Take 1 tablet (40 mg total) by mouth daily. Take extra 40 mg tablet once in the afternoon AS NEEDED for weight gain 3 lbs or more. 60 tablet 3  . gabapentin (NEURONTIN) 300 MG capsule Take 2 capsules (600 mg total) by mouth 3 (three) times daily. 180 capsule 3  . loratadine (CLARITIN) 10 MG tablet Take 1 tablet (10 mg total) by mouth daily. 30 tablet 11  . meclizine (ANTIVERT) 25 MG tablet TAKE ONE TABLET BY  MOUTH THREE TIMES DAILY AS NEEDED FOR  DIZZINESS 60 tablet 0  . omeprazole (PRILOSEC) 20 MG capsule Take 1 capsule (20 mg total) by mouth daily. 30 capsule 3  . potassium chloride (K-DUR,KLOR-CON) 10 MEQ tablet Take 20 mEq by mouth 2 (two) times daily.    . Riociguat (ADEMPAS) 1 MG TABS Take 1 mg 3 (three) times daily by mouth. 90 tablet 5  . rivaroxaban (XARELTO) 20 MG TABS tablet Take 1 tablet (20 mg total) by mouth daily with supper. 90 tablet 3   Current Facility-Administered Medications on File Prior to Visit  Medication Dose Route Frequency Provider Last Rate Last Dose  . heparin flush 10 UNIT/ML injection 5,000 Units  500 mL Intracatheter Q30 days Ladell Pier, MD        No Known Allergies  Social History   Socioeconomic History  . Marital status: Married    Spouse name: Not on file  . Number of children: Not on file  . Years of education: Not on file  . Highest education level: Not on file  Social Needs  . Financial resource strain: Not on file  . Food insecurity - worry: Not on file  . Food insecurity - inability: Not on file  . Transportation needs - medical: Not on file  . Transportation needs - non-medical: Not on file  Occupational History  . Not on file  Tobacco Use  . Smoking status: Never Smoker  . Smokeless tobacco: Never Used  Substance and Sexual Activity  . Alcohol use: No  . Drug use: No  . Sexual activity: Yes    Partners: Male    Birth control/protection: Surgical  Other Topics Concern  . Not on file  Social History Narrative  . Not on file    Family History  Problem Relation Age of Onset  . Lung cancer Maternal Grandmother   . Dementia Mother   . Dementia Father   . Anxiety disorder Sister   . Bipolar disorder Sister   . Drug abuse Sister   . Sexual abuse Maternal Aunt   . Anxiety disorder Cousin   . Anxiety disorder Sister   . Bipolar disorder Sister   . Drug abuse Sister   . Breast cancer Neg Hx     Past Surgical History:    Procedure Laterality Date  . ABDOMINAL HYSTERECTOMY    . BREAST REDUCTION SURGERY    . CHOLECYSTECTOMY    . EMBOLECTOMY N/A    pulmonary embolectomy  . REDUCTION MAMMAPLASTY Bilateral   . RIGHT HEART CATH N/A 10/28/2016   Procedure: Right Heart Cath;  Surgeon: Jolaine Artist, MD;  Location: [facility];  Service: Cardiovascular;  Laterality: N/A;    ROS: Review of Systems Negative except as stated above PHYSICAL EXAM: BP 105/73 (BP Location: Left Wrist, Patient Position: Sitting, Cuff Size: Normal)   Pulse 89   Temp 98.2 F (36.8  C) (Oral)   Resp 18   Ht 5\' 3"  (1.6 m)   Wt (!) 334 lb 9.6 oz (151.8 kg)   SpO2 96%   BMI 59.27 kg/m   O2 sat is on room air.  Patient does not have her oxygen with her today. Wt Readings from Last 3 Encounters:  08/08/17 (!) 334 lb 9.6 oz (151.8 kg)  08/06/17 (!) 330 lb 6.4 oz (149.9 kg)  08/04/17 (!) 333 lb (151 kg)    Physical Exam  General appearance - alert, well appearing, obese African-American female and in no distress Mental status - alert, oriented to person, place, and time, normal mood, behavior, speech, dress, motor activity, and thought processes Neck - supple, no significant adenopathy Chest - clear to auscultation, no wheezes, rales or rhonchi, symmetric air entry Heart - normal rate, regular rhythm, normal S1, S2, no murmurs, rubs, clicks or gallops Ears -clear canal and membrane within normal limits.  No lymphadenopathy Extremities - peripheral pulses normal, no pedal edema, no clubbing or cyanosis  ASSESSMENT AND PLAN: 1. Lumbar radiculopathy Stable on gabapentin and Tylenol #. 4. Receiving some home physical therapy which will also help  2. Morbid obesity with BMI of 50.0-59.9, adult ([place]) -Continue to encourage healthy eating habits.  Printed information given today. -Message sent to our referral person regarding appointment for the general surgeon  3. Right ear pain -Reassurance given  4.  Hoarseness Message sent to our referral person inquiring about her appointment with ENT  5. CTEPH (chronic thromboembolic pulmonary hypertension) ([place]) -encourage pt to carry and use O2 with ambulation as was prescribed  6. Anxiety and depression Patient doing better  Patient was given the opportunity to ask questions.  Patient verbalized understanding of the plan and was able to repeat key elements of the plan.   No orders of the defined types were placed in this encounter.    Requested Prescriptions    No prescriptions requested or ordered in this encounter    Return in about 3 months (around 11/06/2017).  Karle Plumber, MD, FACP

## 2017-08-08 NOTE — Progress Notes (Signed)
Patient stated that she is here for a follow up on hypertension. Patient stated that she have a cold and her right eye is in pain. Patient would like to follow up her referral also.

## 2017-08-08 NOTE — Patient Instructions (Signed)

## 2017-08-11 DIAGNOSIS — M1990 Unspecified osteoarthritis, unspecified site: Secondary | ICD-10-CM | POA: Diagnosis not present

## 2017-08-11 DIAGNOSIS — G4733 Obstructive sleep apnea (adult) (pediatric): Secondary | ICD-10-CM | POA: Diagnosis not present

## 2017-08-11 DIAGNOSIS — I10 Essential (primary) hypertension: Secondary | ICD-10-CM | POA: Diagnosis not present

## 2017-08-11 DIAGNOSIS — F329 Major depressive disorder, single episode, unspecified: Secondary | ICD-10-CM | POA: Diagnosis not present

## 2017-08-11 DIAGNOSIS — K5793 Diverticulitis of intestine, part unspecified, without perforation or abscess with bleeding: Secondary | ICD-10-CM | POA: Diagnosis not present

## 2017-08-11 DIAGNOSIS — J9611 Chronic respiratory failure with hypoxia: Secondary | ICD-10-CM | POA: Diagnosis not present

## 2017-08-11 DIAGNOSIS — G894 Chronic pain syndrome: Secondary | ICD-10-CM | POA: Diagnosis not present

## 2017-08-11 DIAGNOSIS — M069 Rheumatoid arthritis, unspecified: Secondary | ICD-10-CM | POA: Diagnosis not present

## 2017-08-11 DIAGNOSIS — I2724 Chronic thromboembolic pulmonary hypertension: Secondary | ICD-10-CM | POA: Diagnosis not present

## 2017-08-12 ENCOUNTER — Other Ambulatory Visit (HOSPITAL_COMMUNITY): Payer: Self-pay

## 2017-08-12 NOTE — Progress Notes (Signed)
Paramedicine Encounter    Patient ID: [name] [name], female    DOB: 1960-10-25, 56 y.o.   MRN: [id number]   Patient Care Team: Ladell Pier, MD as PCP - General (Internal Medicine) Loletta Specter, RN as [place] Management  Patient Active Problem List   Diagnosis Date Noted  . Lumbar radiculopathy 08/08/2017  . Morbid obesity with BMI of 50.0-59.9, adult ([place]) 08/08/2017  . Hoarseness 08/08/2017  . Chronic cough 04/10/2017  . Rheumatoid arthritis involving multiple sites ([place]) 04/10/2017  . Anxiety and depression 04/10/2017  . Adjustment disorder with depressed mood 12/11/2016  . Morbid obesity ([place]) 08/14/2016  . Stroke ([place]) 06/27/2016  . Right sided weakness 06/27/2016  . Essential hypertension 06/27/2016  . Atypical chest pain 06/27/2016  . Difficult airway 03/29/2016  . CTEPH (chronic thromboembolic pulmonary hypertension) ([place]) 03/14/2016  . OSA on CPAP 02/20/2016  . Chronic pain syndrome 01/31/2016  . Pulmonary embolus ([place]) 11/15/2015  . Vertigo 11/15/2015  . Depression 11/15/2015    Current Outpatient Medications:  .  acetaminophen-codeine (TYLENOL #4) 300-60 MG tablet, Take 1 tablet by mouth every 8 (eight) hours as needed for moderate pain., Disp: 70 tablet, Rfl: 1 .  albuterol (PROVENTIL HFA;VENTOLIN HFA) 108 (90 Base) MCG/ACT inhaler, Inhale 1-2 puffs into the lungs every 6 (six) hours as needed for wheezing or shortness of breath., Disp: 6.7 g, Rfl: 5 .  amitriptyline (ELAVIL) 10 MG tablet, Take 1 tablet (10 mg total) by mouth at bedtime., Disp: 30 tablet, Rfl: 2 .  atorvastatin (LIPITOR) 20 MG tablet, Take 1 tablet (20 mg total) by mouth daily at 6 PM., Disp: 90 tablet, Rfl: 2 .  cyclobenzaprine (FLEXERIL) 5 MG tablet, Take 1 tablet (5 mg total) by mouth 3 (three) times daily as needed. for muscle spams, Disp: 30 tablet, Rfl: 0 .  docusate sodium (COLACE) 100 MG capsule, Take 1 capsule (100 mg total) by mouth every 12 (twelve)  hours., Disp: 30 capsule, Rfl: 0 .  ferrous sulfate (FERROUSUL) 325 (65 FE) MG tablet, Take 1 tablet (325 mg total) by mouth daily with breakfast., Disp: 90 tablet, Rfl: 3 .  fluticasone (FLONASE) 50 MCG/ACT nasal spray, Place 2 sprays into both nostrils daily., Disp: 16 g, Rfl: 6 .  furosemide (LASIX) 40 MG tablet, Take 1 tablet (40 mg total) by mouth daily. Take extra 40 mg tablet once in the afternoon AS NEEDED for weight gain 3 lbs or more., Disp: 60 tablet, Rfl: 3 .  gabapentin (NEURONTIN) 300 MG capsule, Take 2 capsules (600 mg total) by mouth 3 (three) times daily., Disp: 180 capsule, Rfl: 3 .  loratadine (CLARITIN) 10 MG tablet, Take 1 tablet (10 mg total) by mouth daily., Disp: 30 tablet, Rfl: 11 .  meclizine (ANTIVERT) 25 MG tablet, TAKE ONE TABLET BY MOUTH THREE TIMES DAILY AS NEEDED FOR  DIZZINESS, Disp: 60 tablet, Rfl: 0 .  omeprazole (PRILOSEC) 20 MG capsule, Take 1 capsule (20 mg total) by mouth daily., Disp: 30 capsule, Rfl: 3 .  potassium chloride (K-DUR,KLOR-CON) 10 MEQ tablet, Take 20 mEq by mouth 2 (two) times daily., Disp: , Rfl:  .  Riociguat (ADEMPAS) 1 MG TABS, Take 1 mg 3 (three) times daily by mouth., Disp: 90 tablet, Rfl: 5 .  rivaroxaban (XARELTO) 20 MG TABS tablet, Take 1 tablet (20 mg total) by mouth daily with supper., Disp: 90 tablet, Rfl: 3  Current Facility-Administered Medications:  .  heparin flush 10 UNIT/ML injection 5,000 Units, 500 mL, Intracatheter,  Q30 days, Ladell Pier, MD No Known Allergies    Social History   Socioeconomic History  . Marital status: Married    Spouse name: Not on file  . Number of children: Not on file  . Years of education: Not on file  . Highest education level: Not on file  Social Needs  . Financial resource strain: Not on file  . Food insecurity - worry: Not on file  . Food insecurity - inability: Not on file  . Transportation needs - medical: Not on file  . Transportation needs - non-medical: Not on file   Occupational History  . Not on file  Tobacco Use  . Smoking status: Never Smoker  . Smokeless tobacco: Never Used  Substance and Sexual Activity  . Alcohol use: No  . Drug use: No  . Sexual activity: Yes    Partners: Male    Birth control/protection: Surgical  Other Topics Concern  . Not on file  Social History Narrative  . Not on file    Physical Exam  Constitutional: She is oriented to person, place, and time.  Cardiovascular: Normal rate and regular rhythm.  Pulmonary/Chest: Effort normal and breath sounds normal. No respiratory distress. She has no wheezes. She has no rales.  Abdominal: Soft.  Musculoskeletal: Normal range of motion. She exhibits edema.  Neurological: She is alert and oriented to person, place, and time.  Skin: Skin is warm and dry.  Psychiatric: She has a normal mood and affect.        Future Appointments  Date Time Provider [place]  09/01/2017  8:00 AM [facility] ROOM [facility] None  09/01/2017 10:20 AM Bensimhon, Shaune Pascal, MD [facility] None  09/22/2017  4:00 PM [name], MD [facility] None  11/07/2017 11:15 AM Ladell Pier, MD [facility] None    BP 110/76 (BP Location: Left Arm, Patient Position: Sitting, Cuff Size: Large)   Pulse 93   Resp 18   Wt (!) 332 lb (150.6 kg)   SpO2 98%   BMI 58.81 kg/m   Weight yesterday- 330 lb Last visit weight- 330.4 lb  [name] [name] was seen at home today. She feels like she is making improvements thanks to PT. Her mother is living with her again but has in-home assistance which decreases her stress load. She said she is feeling a little bit better and if doing more to care for herself now. She denied SOB but her dizziness is persisting. She is still waiting to hear from an ENT and spine specialist. She reports taking her medications and is weighing daily. Medications were verified and her pillbox was refilled.   Time spent with patient: 38 minutes  [name] [name],  EMT 08/12/17  ACTION: Home visit completed Next visit planned for 1 week

## 2017-08-13 DIAGNOSIS — H52209 Unspecified astigmatism, unspecified eye: Secondary | ICD-10-CM | POA: Diagnosis not present

## 2017-08-13 DIAGNOSIS — H5213 Myopia, bilateral: Secondary | ICD-10-CM | POA: Diagnosis not present

## 2017-08-14 ENCOUNTER — Other Ambulatory Visit: Payer: Self-pay

## 2017-08-14 NOTE — Patient Outreach (Signed)
[facility]) Care Management  08/14/17  [name] [name] Feb 07, 1961 [id number]  Attempted to reach patient without success. Left HIPAA compliant voicemail with female stating she was patient's mother. Requested callback.  [name] Misael Mcgaha, RN, BSN, [place] Management Coordinator [phone]

## 2017-08-15 DIAGNOSIS — M1990 Unspecified osteoarthritis, unspecified site: Secondary | ICD-10-CM | POA: Diagnosis not present

## 2017-08-15 DIAGNOSIS — F329 Major depressive disorder, single episode, unspecified: Secondary | ICD-10-CM | POA: Diagnosis not present

## 2017-08-15 DIAGNOSIS — I10 Essential (primary) hypertension: Secondary | ICD-10-CM | POA: Diagnosis not present

## 2017-08-15 DIAGNOSIS — K5793 Diverticulitis of intestine, part unspecified, without perforation or abscess with bleeding: Secondary | ICD-10-CM | POA: Diagnosis not present

## 2017-08-15 DIAGNOSIS — R0602 Shortness of breath: Secondary | ICD-10-CM | POA: Diagnosis not present

## 2017-08-15 DIAGNOSIS — I2724 Chronic thromboembolic pulmonary hypertension: Secondary | ICD-10-CM | POA: Diagnosis not present

## 2017-08-15 DIAGNOSIS — G4733 Obstructive sleep apnea (adult) (pediatric): Secondary | ICD-10-CM | POA: Diagnosis not present

## 2017-08-15 DIAGNOSIS — M069 Rheumatoid arthritis, unspecified: Secondary | ICD-10-CM | POA: Diagnosis not present

## 2017-08-15 DIAGNOSIS — J9611 Chronic respiratory failure with hypoxia: Secondary | ICD-10-CM | POA: Diagnosis not present

## 2017-08-15 DIAGNOSIS — G894 Chronic pain syndrome: Secondary | ICD-10-CM | POA: Diagnosis not present

## 2017-08-20 ENCOUNTER — Emergency Department (HOSPITAL_COMMUNITY): Payer: Medicare HMO

## 2017-08-20 ENCOUNTER — Inpatient Hospital Stay (HOSPITAL_COMMUNITY)
Admission: EM | Admit: 2017-08-20 | Discharge: 2017-08-23 | DRG: 292 | Disposition: A | Discharge status: Home / Self Care | Payer: Medicare HMO | Attending: Internal Medicine | Admitting: Internal Medicine

## 2017-08-20 ENCOUNTER — Encounter (HOSPITAL_COMMUNITY): Payer: Self-pay | Admitting: Emergency Medicine

## 2017-08-20 ENCOUNTER — Other Ambulatory Visit: Payer: Self-pay

## 2017-08-20 DIAGNOSIS — R0789 Other chest pain: Secondary | ICD-10-CM | POA: Diagnosis present

## 2017-08-20 DIAGNOSIS — Z79899 Other long term (current) drug therapy: Secondary | ICD-10-CM

## 2017-08-20 DIAGNOSIS — D508 Other iron deficiency anemias: Secondary | ICD-10-CM | POA: Diagnosis not present

## 2017-08-20 DIAGNOSIS — I2724 Chronic thromboembolic pulmonary hypertension: Secondary | ICD-10-CM | POA: Diagnosis not present

## 2017-08-20 DIAGNOSIS — Z801 Family history of malignant neoplasm of trachea, bronchus and lung: Secondary | ICD-10-CM | POA: Diagnosis not present

## 2017-08-20 DIAGNOSIS — Z818 Family history of other mental and behavioral disorders: Secondary | ICD-10-CM

## 2017-08-20 DIAGNOSIS — F4321 Adjustment disorder with depressed mood: Secondary | ICD-10-CM | POA: Diagnosis present

## 2017-08-20 DIAGNOSIS — I509 Heart failure, unspecified: Secondary | ICD-10-CM

## 2017-08-20 DIAGNOSIS — M069 Rheumatoid arthritis, unspecified: Secondary | ICD-10-CM | POA: Diagnosis not present

## 2017-08-20 DIAGNOSIS — R0600 Dyspnea, unspecified: Secondary | ICD-10-CM | POA: Diagnosis not present

## 2017-08-20 DIAGNOSIS — I878 Other specified disorders of veins: Secondary | ICD-10-CM | POA: Diagnosis not present

## 2017-08-20 DIAGNOSIS — M1990 Unspecified osteoarthritis, unspecified site: Secondary | ICD-10-CM | POA: Diagnosis not present

## 2017-08-20 DIAGNOSIS — Z95828 Presence of other vascular implants and grafts: Secondary | ICD-10-CM

## 2017-08-20 DIAGNOSIS — Z90710 Acquired absence of both cervix and uterus: Secondary | ICD-10-CM | POA: Diagnosis not present

## 2017-08-20 DIAGNOSIS — G894 Chronic pain syndrome: Secondary | ICD-10-CM | POA: Diagnosis present

## 2017-08-20 DIAGNOSIS — Z9989 Dependence on other enabling machines and devices: Secondary | ICD-10-CM

## 2017-08-20 DIAGNOSIS — M0689 Other specified rheumatoid arthritis, multiple sites: Secondary | ICD-10-CM | POA: Diagnosis present

## 2017-08-20 DIAGNOSIS — R071 Chest pain on breathing: Secondary | ICD-10-CM | POA: Diagnosis not present

## 2017-08-20 DIAGNOSIS — D509 Iron deficiency anemia, unspecified: Secondary | ICD-10-CM | POA: Diagnosis present

## 2017-08-20 DIAGNOSIS — I5033 Acute on chronic diastolic (congestive) heart failure: Secondary | ICD-10-CM

## 2017-08-20 DIAGNOSIS — I5030 Unspecified diastolic (congestive) heart failure: Secondary | ICD-10-CM | POA: Diagnosis not present

## 2017-08-20 DIAGNOSIS — Z6841 Body Mass Index (BMI) 40.0 and over, adult: Secondary | ICD-10-CM | POA: Diagnosis not present

## 2017-08-20 DIAGNOSIS — I110 Hypertensive heart disease with heart failure: Principal | ICD-10-CM | POA: Diagnosis present

## 2017-08-20 DIAGNOSIS — Z9049 Acquired absence of other specified parts of digestive tract: Secondary | ICD-10-CM

## 2017-08-20 DIAGNOSIS — E785 Hyperlipidemia, unspecified: Secondary | ICD-10-CM | POA: Diagnosis present

## 2017-08-20 DIAGNOSIS — Z7901 Long term (current) use of anticoagulants: Secondary | ICD-10-CM | POA: Diagnosis not present

## 2017-08-20 DIAGNOSIS — Z713 Dietary counseling and surveillance: Secondary | ICD-10-CM

## 2017-08-20 DIAGNOSIS — I50810 Right heart failure, unspecified: Secondary | ICD-10-CM | POA: Diagnosis not present

## 2017-08-20 DIAGNOSIS — Z86711 Personal history of pulmonary embolism: Secondary | ICD-10-CM

## 2017-08-20 DIAGNOSIS — Z9889 Other specified postprocedural states: Secondary | ICD-10-CM | POA: Diagnosis not present

## 2017-08-20 DIAGNOSIS — I10 Essential (primary) hypertension: Secondary | ICD-10-CM | POA: Diagnosis present

## 2017-08-20 DIAGNOSIS — G4733 Obstructive sleep apnea (adult) (pediatric): Secondary | ICD-10-CM | POA: Diagnosis not present

## 2017-08-20 DIAGNOSIS — M797 Fibromyalgia: Secondary | ICD-10-CM | POA: Diagnosis present

## 2017-08-20 DIAGNOSIS — F419 Anxiety disorder, unspecified: Secondary | ICD-10-CM | POA: Diagnosis present

## 2017-08-20 DIAGNOSIS — K5793 Diverticulitis of intestine, part unspecified, without perforation or abscess with bleeding: Secondary | ICD-10-CM | POA: Diagnosis not present

## 2017-08-20 DIAGNOSIS — R339 Retention of urine, unspecified: Secondary | ICD-10-CM | POA: Diagnosis not present

## 2017-08-20 DIAGNOSIS — J9610 Chronic respiratory failure, unspecified whether with hypoxia or hypercapnia: Secondary | ICD-10-CM | POA: Diagnosis present

## 2017-08-20 DIAGNOSIS — I2721 Secondary pulmonary arterial hypertension: Secondary | ICD-10-CM | POA: Diagnosis not present

## 2017-08-20 DIAGNOSIS — I4519 Other right bundle-branch block: Secondary | ICD-10-CM | POA: Diagnosis present

## 2017-08-20 DIAGNOSIS — K219 Gastro-esophageal reflux disease without esophagitis: Secondary | ICD-10-CM | POA: Diagnosis present

## 2017-08-20 DIAGNOSIS — R079 Chest pain, unspecified: Secondary | ICD-10-CM | POA: Diagnosis not present

## 2017-08-20 DIAGNOSIS — I2699 Other pulmonary embolism without acute cor pulmonale: Secondary | ICD-10-CM | POA: Diagnosis not present

## 2017-08-20 DIAGNOSIS — Z9981 Dependence on supplemental oxygen: Secondary | ICD-10-CM | POA: Diagnosis not present

## 2017-08-20 DIAGNOSIS — F329 Major depressive disorder, single episode, unspecified: Secondary | ICD-10-CM | POA: Diagnosis present

## 2017-08-20 DIAGNOSIS — I4510 Unspecified right bundle-branch block: Secondary | ICD-10-CM | POA: Diagnosis not present

## 2017-08-20 DIAGNOSIS — Z7951 Long term (current) use of inhaled steroids: Secondary | ICD-10-CM | POA: Diagnosis not present

## 2017-08-20 DIAGNOSIS — I361 Nonrheumatic tricuspid (valve) insufficiency: Secondary | ICD-10-CM | POA: Diagnosis not present

## 2017-08-20 DIAGNOSIS — J9611 Chronic respiratory failure with hypoxia: Secondary | ICD-10-CM | POA: Diagnosis not present

## 2017-08-20 HISTORY — DX: Heart failure, unspecified: I50.9

## 2017-08-20 LAB — BASIC METABOLIC PANEL
Anion gap: 8 (ref 5–15)
BUN: 12 mg/dL (ref 6–20)
CO2: 25 mmol/L (ref 22–32)
Calcium: 9.1 mg/dL (ref 8.9–10.3)
Chloride: 106 mmol/L (ref 101–111)
Creatinine, Ser: 0.98 mg/dL (ref 0.44–1.00)
GFR calc Af Amer: >60 mL/min (ref >60)
GFR calc non Af Amer: >60 mL/min (ref >60)
Glucose, Bld: 92 mg/dL (ref 65–99)
Potassium: 3.9 mmol/L (ref 3.5–5.1)
Sodium: 139 mmol/L (ref 135–145)

## 2017-08-20 LAB — TROPONIN I
Troponin I: <0.03 ng/mL (ref <0.03)
Troponin I: <0.03 ng/mL (ref <0.03)

## 2017-08-20 LAB — HEPATIC FUNCTION PANEL
ALT: 13 U/L — ABNORMAL LOW (ref 14–54)
AST: 17 U/L (ref 15–41)
Albumin: 3.3 g/dL — ABNORMAL LOW (ref 3.5–5.0)
Alkaline Phosphatase: 78 U/L (ref 38–126)
Bilirubin, Direct: <0.1 mg/dL — ABNORMAL LOW (ref 0.1–0.5)
Total Bilirubin: 0.6 mg/dL (ref 0.3–1.2)
Total Protein: 6.6 g/dL (ref 6.5–8.1)

## 2017-08-20 LAB — CBC
HCT: 34.8 % — ABNORMAL LOW (ref 36.0–46.0)
Hemoglobin: 11.4 g/dL — ABNORMAL LOW (ref 12.0–15.0)
MCH: 21.2 pg — ABNORMAL LOW (ref 26.0–34.0)
MCHC: 32.8 g/dL (ref 30.0–36.0)
MCV: 64.7 fL — ABNORMAL LOW (ref 78.0–100.0)
Platelets: 274 10*3/uL (ref 150–400)
RBC: 5.38 MIL/uL — ABNORMAL HIGH (ref 3.87–5.11)
RDW: 17.9 % — ABNORMAL HIGH (ref 11.5–15.5)
WBC: 5.7 10*3/uL (ref 4.0–10.5)

## 2017-08-20 LAB — BRAIN NATRIURETIC PEPTIDE: B Natriuretic Peptide: 12.4 pg/mL (ref 0.0–100.0)

## 2017-08-20 LAB — I-STAT TROPONIN, ED: Troponin i, poc: 0 ng/mL (ref 0.00–0.08)

## 2017-08-20 MED ORDER — ACETAMINOPHEN 650 MG RE SUPP: 650.0000 mg | Freq: Four times a day (QID) | RECTAL | Status: DC | PRN

## 2017-08-20 MED ORDER — ALBUTEROL SULFATE (2.5 MG/3ML) 0.083% IN NEBU: 2.5000 mg | INHALATION_SOLUTION | Freq: Four times a day (QID) | RESPIRATORY_TRACT | Status: DC | PRN

## 2017-08-20 MED ORDER — RIVAROXABAN 20 MG PO TABS
20.0000 mg | ORAL_TABLET | Freq: Every day | ORAL | Status: DC
  Administered 2017-08-20 – 2017-08-22 (×3): 20 mg via ORAL
  Filled 2017-08-20 (×3): qty 1

## 2017-08-20 MED ORDER — RIOCIGUAT 1 MG PO TABS
1.0000 mg | ORAL_TABLET | Freq: Three times a day (TID) | ORAL | Status: DC
  Administered 2017-08-21 (×2): 1 mg via ORAL
  Filled 2017-08-20 (×2): qty 1

## 2017-08-20 MED ORDER — FUROSEMIDE 10 MG/ML IJ SOLN
80.0000 mg | Freq: Once | INTRAMUSCULAR | Status: AC
  Administered 2017-08-20: 80 mg via INTRAVENOUS
  Filled 2017-08-20: qty 8

## 2017-08-20 MED ORDER — ACETAMINOPHEN 325 MG PO TABS
650.0000 mg | ORAL_TABLET | Freq: Four times a day (QID) | ORAL | Status: DC | PRN
  Administered 2017-08-20 – 2017-08-23 (×8): 650 mg via ORAL
  Filled 2017-08-20 (×8): qty 2

## 2017-08-20 MED ORDER — ATORVASTATIN CALCIUM 20 MG PO TABS
20.0000 mg | ORAL_TABLET | Freq: Every day | ORAL | Status: DC
  Administered 2017-08-21 – 2017-08-22 (×2): 20 mg via ORAL
  Filled 2017-08-20 (×2): qty 1

## 2017-08-20 MED ORDER — GABAPENTIN 300 MG PO CAPS
600.0000 mg | ORAL_CAPSULE | Freq: Three times a day (TID) | ORAL | Status: DC
  Administered 2017-08-20 – 2017-08-23 (×9): 600 mg via ORAL
  Filled 2017-08-20 (×9): qty 2

## 2017-08-20 MED ORDER — ASPIRIN EC 81 MG PO TBEC: 81.0000 mg | DELAYED_RELEASE_TABLET | Freq: Every day | ORAL | Status: DC

## 2017-08-20 MED ORDER — SODIUM CHLORIDE 0.9% FLUSH
3.0000 mL | Freq: Two times a day (BID) | INTRAVENOUS | Status: DC
  Administered 2017-08-20 – 2017-08-23 (×5): 3 mL via INTRAVENOUS

## 2017-08-20 MED ORDER — AMITRIPTYLINE HCL 10 MG PO TABS
10.0000 mg | ORAL_TABLET | Freq: Every day | ORAL | Status: DC
  Administered 2017-08-20 – 2017-08-22 (×3): 10 mg via ORAL
  Filled 2017-08-20 (×4): qty 1

## 2017-08-20 MED ORDER — ASPIRIN 81 MG PO CHEW
324.0000 mg | CHEWABLE_TABLET | Freq: Once | ORAL | Status: DC
  Filled 2017-08-20: qty 4

## 2017-08-20 MED ORDER — PROMETHAZINE HCL 25 MG PO TABS
12.5000 mg | ORAL_TABLET | Freq: Four times a day (QID) | ORAL | Status: DC | PRN
  Administered 2017-08-22 – 2017-08-23 (×2): 12.5 mg via ORAL
  Filled 2017-08-20 (×2): qty 1

## 2017-08-20 MED ORDER — CYCLOBENZAPRINE HCL 10 MG PO TABS
5.0000 mg | ORAL_TABLET | Freq: Three times a day (TID) | ORAL | Status: DC | PRN
  Administered 2017-08-20 – 2017-08-23 (×3): 5 mg via ORAL
  Filled 2017-08-20 (×3): qty 1

## 2017-08-20 MED ORDER — SODIUM CHLORIDE 0.9% FLUSH: 10.0000 mL | INTRAVENOUS | Status: DC | PRN

## 2017-08-20 NOTE — ED Triage Notes (Signed)
Patient from home with [facility] for complaint of chest pain x3 days. States current episode started at 0300 this morning and woke her up from sleep. Pain radiates from left chest to left arm, also complains of nausea and shortness of breath.  Received 324mg  aspirin and 4mg  zofran PTA. Patient also complains of increased swelling in bilateral lower extremities for which she took double her normal dose of lasix yesterday but she has not been urinating as she normally would. Wears 2L O2 [place] at baseline.  Patient alert, oriented, and ambulatory with assistance.

## 2017-08-20 NOTE — ED Notes (Signed)
Purewick catheter applied

## 2017-08-20 NOTE — ED Notes (Signed)
Admitting team at bedside.

## 2017-08-20 NOTE — H&P (Signed)
Date: 08/20/2017               Patient Name:  [name] [name] MRN: [id number]  DOB: 05-04-1961 Age / Sex: 56 y.o., female   PCP: [name] Pier, MD         Medical Service: Internal Medicine Teaching Service         Attending Physician: Dr. Lucious Groves, DO    First Contact: Dr. Vickki [name] Pager: [phone]  Second Contact: Dr. Alphonzo [name] Pager: [phone]       After Hours (After 5p/  First Contact Pager: [phone]  weekends / holidays): Second Contact Pager: [phone]   Chief Complaint: SOB, chest pain  History of Present Illness: Patient is a 56 year old morbidly obese female, with past medical history of HFpEF w/ Group 4 PAH s/p thromboembolectomy 7/17 with IVC filter on Xarelto, on 2L [place] chronically, OSA on cpap, chronic chest pain, anxiety/depression, chronic pain syndrome, GERD. .    She presents with increased shortness of breath over the last 3 days.  She has noticed a 5 pound weight gain over the last few days.  She noticed that her Lasix 40 mg that she takes every day has not been working for the last few days and has Had a gradual taper over the last week.  She took an extra dose yesterday as instructed however, again she had very little urine output afterwards.  This with the addition of the shortness of breath and change in character of her chest pain she usually has this on the right and this time it was on the left as well she decided she should come into the hospital.  Her chest pain seems to be a chronic issue for her mainly and is on her right side.  Seems to be worse when she is more fluid overloaded.  She describes this pain as a sharp electric type pain, it is pleuritic, nonreproducible, nonradiating, it is worse when she tries to lay down horizontally.  She does describe some pain in her left arm that she said is unusually more swollen since being fluid overloaded which seems to have caused her some pain.  She does endorse some nausea, and a headache, she  has had no vomiting, diarrhea, she endorses a dry cough over the last few days, she denies dysuria.    ED course: Patient arrived with normal vital signs, she was 100% on 2 L nasal cannula.  The troponin was drawn which was negative, BMP showed no metabolic abnormalities and she had a normal white blood cell count and a mild microcytic anemia which appears to be chronic and stable.  She had a mild low albumin but otherwise liver function was normal.  Her chest x-ray showed no acute process.  And her EKG showed sinus rhythm a chronic RBBB and chronic nonspecific ST changes.  She had a BNP that was negative as well which is chronically negative.       Meds:  Current Facility-Administered Medications for the 08/20/17 encounter [facility] Encounter)  Medication  . heparin flush 10 UNIT/ML injection 5,000 Units   Current Meds  Medication Sig  . acetaminophen-codeine (TYLENOL #4) 300-60 MG tablet Take 1 tablet by mouth every 8 (eight) hours as needed for moderate pain.  Marland Kitchen albuterol (PROVENTIL HFA;VENTOLIN HFA) 108 (90 Base) MCG/ACT inhaler Inhale 1-2 puffs into the lungs every 6 (six) hours as needed for wheezing or shortness of breath.  Marland Kitchen amitriptyline (ELAVIL) 10 MG tablet Take 1 tablet (  10 mg total) by mouth at bedtime.  Marland Kitchen atorvastatin (LIPITOR) 20 MG tablet Take 1 tablet (20 mg total) by mouth daily at 6 PM.  . cyclobenzaprine (FLEXERIL) 5 MG tablet Take 1 tablet (5 mg total) by mouth 3 (three) times daily as needed. for muscle spams  . docusate sodium (COLACE) 100 MG capsule Take 1 capsule (100 mg total) by mouth every 12 (twelve) hours.  . ferrous sulfate (FERROUSUL) 325 (65 FE) MG tablet Take 1 tablet (325 mg total) by mouth daily with breakfast.  . fluticasone (FLONASE) 50 MCG/ACT nasal spray Place 2 sprays into both nostrils daily. (Patient taking differently: Place 2 sprays into both nostrils daily as needed for allergies. )  . furosemide (LASIX) 40 MG tablet Take 1 tablet (40 mg total) by  mouth daily. Take extra 40 mg tablet once in the afternoon AS NEEDED for weight gain 3 lbs or more.  . gabapentin (NEURONTIN) 300 MG capsule Take 2 capsules (600 mg total) by mouth 3 (three) times daily.  Marland Kitchen loratadine (CLARITIN) 10 MG tablet Take 1 tablet (10 mg total) by mouth daily.  . meclizine (ANTIVERT) 25 MG tablet TAKE ONE TABLET BY MOUTH THREE TIMES DAILY AS NEEDED FOR  DIZZINESS  . omeprazole (PRILOSEC) 20 MG capsule Take 1 capsule (20 mg total) by mouth daily.  . potassium chloride (K-DUR,KLOR-CON) 10 MEQ tablet Take 20 mEq by mouth 2 (two) times daily.  . Riociguat (ADEMPAS) 1 MG TABS Take 1 mg 3 (three) times daily by mouth.  . rivaroxaban (XARELTO) 20 MG TABS tablet Take 1 tablet (20 mg total) by mouth daily with supper.     Allergies: Allergies as of 08/20/2017  . (No Known Allergies)   Past Medical History:  Diagnosis Date  . Arthritis   . Depression   . Diverticulitis   . Hyperlipidemia   . Hypertension   . Obesity   . Pneumonia   . Pulmonary embolism (HCC)     Family History:  Family History  Problem Relation Age of Onset  . Lung cancer Maternal Grandmother   . Dementia Mother   . Dementia Father   . Anxiety disorder Sister   . Bipolar disorder Sister   . Drug abuse Sister   . Sexual abuse Maternal Aunt   . Anxiety disorder Cousin   . Anxiety disorder Sister   . Bipolar disorder Sister   . Drug abuse Sister   . Breast cancer Neg Hx     Social History:  Social History   Socioeconomic History  . Marital status: Married    Spouse name: None  . Number of children: None  . Years of education: None  . Highest education level: None  Social Needs  . Financial resource strain: None  . Food insecurity - worry: None  . Food insecurity - inability: None  . Transportation needs - medical: None  . Transportation needs - non-medical: None  Occupational History  . None  Tobacco Use  . Smoking status: Never Smoker  . Smokeless tobacco: Never Used    Substance and Sexual Activity  . Alcohol use: No  . Drug use: No  . Sexual activity: Yes    Partners: Male    Birth control/protection: Surgical  Other Topics Concern  . None  Social History Narrative  . None    Review of Systems: A complete ROS was negative except as per HPI.   Physical Exam: Blood pressure 101/85, pulse 65, temperature 98.4 F (36.9 C), temperature source Oral,  resp. rate 15, height 5\' 3"  (1.6 m), weight (!) 332 lb 14.3 oz (151 kg), SpO2 100 %. Physical Exam  Constitutional: She is oriented to person, place, and time. No distress.  Neck: JVD (9cm) present.  Cardiovascular: Normal rate, regular rhythm and normal heart sounds. Exam reveals no gallop and no friction rub.  No murmur heard. Pulmonary/Chest: Effort normal and breath sounds normal. No respiratory distress. She has no wheezes. She has no rales. She exhibits no tenderness.  Abdominal: Soft. Bowel sounds are normal. She exhibits no distension and no mass. There is no tenderness. There is no rebound and no guarding.  Musculoskeletal:       Right lower leg: She exhibits edema.       Left lower leg: She exhibits edema.  Neurological: She is alert and oriented to person, place, and time.  Skin: She is not diaphoretic.  Psychiatric:  Pt tearful, feels this is her fault    EKG: personally reviewed my interpretation is SR, chronic RBBB, chronic nonspecific ST changes  CXR: personally reviewed my interpretation is no acute cardiopulmonary process  Assessment & Plan by Problem: Active Problems:   (HFpEF) heart failure with preserved ejection fraction (HCC)  HFpEF w/ Group 4 PAH s/p thromboembolectomy: Pt mildly fluid overloaded peripherally, no evidence of pulm edema on chest x-ray or physical exam although pt reporting feeling more short of breath but no increased O2 requirements.  10 lbs up from last dry weight.     -Pt received 80mg  IV lasix in ED. I think this is a good start will reevaluate pt  tomorrow morning. -continue CPAP, home 2L oxygen -continue [place]   Atypical Chest pain: symptoms not convincing for angina, neg EKG, neg troponin, pt does have risk factors.  Pleuritic pain but do not feel her clinical picture is compatible with PE.  Pt reports good compliance with xarelto as well.  -continuous cardiac monitoring -trend troponins, serial EKG's -I expect this chest pain will get better with diuresis and once the patient is less anxious.    OSA: chronic, stable, on CPAP   -continue cpap     Dispo: Admit patient to Inpatient with expected length of stay greater than 2 midnights.  Signed: Katherine Roan, MD 08/20/2017, 7:25 PM  [name] Muff MD PGY-1 Internal Medicine Pager # [phone]

## 2017-08-20 NOTE — ED Provider Notes (Signed)
[facility] EMERGENCY DEPARTMENT Provider Note   CSN: [id number] Arrival date & time: 08/20/17  1203     History   Chief Complaint Chief Complaint  Patient presents with  . Chest Pain    HPI Cleora Karnik is a 56 y.o. female.  HPI 56 year old female who presents with dyspnea and fluid overload. She has history of right sided CHF due to PE, on Xarelto, chronic respiratory failure on 2 L [place]. Reports increased LE and arm edema since this weekend, with increased DOE. Has had PND yesterday and worsening orthopnea over past few nights. No cough, fever, nausea, vomiting, diarrhea, abdominal pain. Complaints of intermittent sharp left sided chest pains radiating to left arm, primarily randomly at rest and when she woke up this morning from sleep. Denies exertional component. Took extra dose of lasix yesterday, with minimal urine output.   Past Medical History:  Diagnosis Date  . Arthritis   . Depression   . Diverticulitis   . Hyperlipidemia   . Hypertension   . Obesity   . Pneumonia   . Pulmonary embolism [facility])     Patient Active Problem List   Diagnosis Date Noted  . Lumbar radiculopathy 08/08/2017  . Morbid obesity with BMI of 50.0-59.9, adult ([place]) 08/08/2017  . Hoarseness 08/08/2017  . Chronic cough 04/10/2017  . Rheumatoid arthritis involving multiple sites ([place]) 04/10/2017  . Anxiety and depression 04/10/2017  . Adjustment disorder with depressed mood 12/11/2016  . Morbid obesity ([place]) 08/14/2016  . Stroke ([place]) 06/27/2016  . Right sided weakness 06/27/2016  . Essential hypertension 06/27/2016  . Atypical chest pain 06/27/2016  . Difficult airway 03/29/2016  . 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 OSA on CPAP 02/20/2016  . Chronic pain syndrome 01/31/2016  . Pulmonary embolus ([place]) 11/15/2015  . Vertigo 11/15/2015  . Depression 11/15/2015    Past Surgical History:  Procedure Laterality Date  . ABDOMINAL  HYSTERECTOMY    . BREAST REDUCTION SURGERY    . CHOLECYSTECTOMY    . EMBOLECTOMY N/A    pulmonary embolectomy  . REDUCTION MAMMAPLASTY Bilateral   . RIGHT HEART CATH N/A 10/28/2016   Procedure: Right Heart Cath;  Surgeon: Jolaine Artist, MD;  Location: [facility] CV LAB;  Service: Cardiovascular;  Laterality: N/A;    OB History    No data available       Home Medications    Prior to Admission medications   Medication Sig Start Date End Date Taking? Authorizing Provider  acetaminophen-codeine (TYLENOL #4) 300-60 MG tablet Take 1 tablet by mouth every 8 (eight) hours as needed for moderate pain. 06/30/17  Yes Ladell Pier, MD  albuterol (PROVENTIL HFA;VENTOLIN HFA) 108 (90 Base) MCG/ACT inhaler Inhale 1-2 puffs into the lungs every 6 (six) hours as needed for wheezing or shortness of breath. 04/10/17  Yes Ladell Pier, MD  amitriptyline (ELAVIL) 10 MG tablet Take 1 tablet (10 mg total) by mouth at bedtime. 06/30/17  Yes Ladell Pier, MD  atorvastatin (LIPITOR) 20 MG tablet Take 1 tablet (20 mg total) by mouth daily at 6 PM. 01/27/17  Yes Langeland, Dawn T, MD  cyclobenzaprine (FLEXERIL) 5 MG tablet Take 1 tablet (5 mg total) by mouth 3 (three) times daily as needed. for muscle spams 07/30/17  Yes Ladell Pier, MD  docusate sodium (COLACE) 100 MG capsule Take 1 capsule (100 mg total) by mouth every 12 (twelve) hours. 10/24/16  Yes Margarita Mail, PA-C  ferrous  sulfate (FERROUSUL) 325 (65 FE) MG tablet Take 1 tablet (325 mg total) by mouth daily with breakfast. 12/25/16  Yes Langeland, Dawn T, MD  fluticasone (FLONASE) 50 MCG/ACT nasal spray Place 2 sprays into both nostrils daily. Patient taking differently: Place 2 sprays into both nostrils daily as needed for allergies.  02/04/17  Yes Langeland, Dawn T, MD  furosemide (LASIX) 40 MG tablet Take 1 tablet (40 mg total) by mouth daily. Take extra 40 mg tablet once in the afternoon AS NEEDED for weight gain 3 lbs or  more. 01/27/17  Yes Langeland, Dawn T, MD  gabapentin (NEURONTIN) 300 MG capsule Take 2 capsules (600 mg total) by mouth 3 (three) times daily. 06/13/17  Yes Ladell Pier, MD  loratadine (CLARITIN) 10 MG tablet Take 1 tablet (10 mg total) by mouth daily. 03/03/17  Yes McClung, Dionne Bucy, PA-C  meclizine (ANTIVERT) 25 MG tablet TAKE ONE TABLET BY MOUTH THREE TIMES DAILY AS NEEDED FOR  DIZZINESS 06/27/17  Yes Arbutus Leas, NP  omeprazole (PRILOSEC) 20 MG capsule Take 1 capsule (20 mg total) by mouth daily. 06/13/17  Yes Ladell Pier, MD  potassium chloride (K-DUR,KLOR-CON) 10 MEQ tablet Take 20 mEq by mouth 2 (two) times daily.   Yes [provider]  Riociguat (ADEMPAS) 1 MG TABS Take 1 mg 3 (three) times daily by mouth. 07/29/17  Yes Bensimhon, Shaune Pascal, MD  rivaroxaban (XARELTO) 20 MG TABS tablet Take 1 tablet (20 mg total) by mouth daily with supper. 01/27/17  Yes Maren Reamer, MD    Family History Family History  Problem Relation Age of Onset  . Lung cancer Maternal Grandmother   . Dementia Mother   . Dementia Father   . Anxiety disorder Sister   . Bipolar disorder Sister   . Drug abuse Sister   . Sexual abuse Maternal Aunt   . Anxiety disorder Cousin   . Anxiety disorder Sister   . Bipolar disorder Sister   . Drug abuse Sister   . Breast cancer Neg Hx     Social History Social History   Tobacco Use  . Smoking status: Never Smoker  . Smokeless tobacco: Never Used  Substance Use Topics  . Alcohol use: No  . Drug use: No     Allergies   [name].   Review of Systems Review of Systems  Constitutional: Negative for fever.  Respiratory: Positive for shortness of breath.   Cardiovascular: Positive for chest pain and leg swelling.  Gastrointestinal: Negative for abdominal pain, diarrhea, nausea and vomiting.  All other systems reviewed and are negative.    Physical Exam Updated Vital Signs BP 120/71 (BP Location: Left Arm)    Pulse 68   Temp (!) 97.5 F (36.4 C) (Oral)   Resp 12   SpO2 100%   Physical Exam Physical Exam  Nursing note and vitals reviewed. Constitutional: non-toxic, and in no acute distress Head: Normocephalic and atraumatic.  Mouth/Throat: Oropharynx is clear and moist.  Neck: Normal range of motion. Neck supple.  Cardiovascular: Normal rate and regular rhythm.   Pulmonary/Chest: Effort normal and breath sounds normal.  Abdominal: Soft. There is no tenderness. There is no rebound and no guarding.  Musculoskeletal: Normal range of motion. +2 pitting edema in bilateral lower extremities, edematous upper extremities bilaterally Neurological: Alert, no facial droop, fluent speech, moves all extremities symmetrically Skin: Skin is warm and dry.  Psychiatric: Cooperative   ED Treatments / Results  Labs (all labs  ordered are listed, but only abnormal results are displayed) Labs Reviewed  CBC - Abnormal; Notable for the following components:      Result Value   RBC 5.38 (*)    Hemoglobin 11.4 (*)    HCT 34.8 (*)    MCV 64.7 (*)    MCH 21.2 (*)    RDW 17.9 (*)    All other components within normal limits  HEPATIC FUNCTION PANEL - Abnormal; Notable for the following components:   Albumin 3.3 (*)    ALT 13 (*)    Bilirubin, Direct <0.1 (*)    All other components within normal limits  BASIC METABOLIC PANEL  BRAIN NATRIURETIC PEPTIDE  I-STAT TROPONIN, ED    EKG  EKG Interpretation  Date/Time:  Wednesday August 20 2017 12:12:19 EST Ventricular Rate:  77 PR Interval:    QRS Duration: 134 QT Interval:  430 QTC Calculation: 487 R Axis:   10 Text Interpretation:  Sinus rhythm Nonspecific intraventricular conduction delay Minimal ST depression Borderline ST elevation, lateral leads no acute changes  Confirmed by Brantley Stage [phone]) on 08/20/2017 1:42:58 PM       Radiology Dg Chest 2 View  Result Date: 08/20/2017 CLINICAL DATA:  56 year old female with chest pain and  shortness of breath for 3 days. Pain radiating to the left arm with nausea. EXAM: CHEST  2 VIEW COMPARISON:  06/02/2017 and earlier. FINDINGS: Stable right chest porta cath, currently accessed. Prior sternotomy. Large body habitus. Stable cardiomegaly and mediastinal contours. Visualized tracheal air column is within normal limits. No pneumothorax, pleural effusion or confluent pulmonary opacity. Stable pulmonary vascularity, no pulmonary edema. No acute osseous abnormality identified. IMPRESSION: No acute cardiopulmonary abnormality. Electronically Signed   By: Genevie Ann M.D.   On: 08/20/2017 13:38    Procedures Procedures (including critical care time)  Medications Ordered in ED Medications  aspirin chewable tablet 324 mg (324 mg Oral Not Given 08/20/17 1232)  sodium chloride flush (NS) 0.9 % injection 10-40 mL (not administered)  furosemide (LASIX) injection 80 mg (not administered)     Initial Impression / Assessment and Plan / ED Course  I have reviewed the triage vital signs and the nursing notes.  Pertinent labs & imaging results that were available during my care of the patient were reviewed by me and considered in my medical decision making (see chart for details).     History of right-sided CHF related to previous PE who presents with increased fluid weight gain and shortness of breath.  Is on home 2 L of oxygen, breathing comfortably with normal oxygenation.  Does look fluid overloaded on exam.  Chest x-ray without acute cardiopulmonary processes such as infiltrate or edema.  Her BMP is normal.  She has baseline anemia and the remainder of her blood work overall reassuring.  Troponin is normal and EKG is not acutely ischemic.  She has been compliant with her Xarelto, and my suspicion for recurrent PE is low.  She is given 80 of IV Lasix.  Discussed with internal medicine teaching service who will admit for ongoing diuresis.  Final Clinical Impressions(s) / ED Diagnoses   Final  diagnoses:  Acute on chronic diastolic (congestive) heart failure [facility])    ED Discharge Orders    None       Forde Dandy, MD 08/20/17 1525

## 2017-08-20 NOTE — Progress Notes (Signed)
ANTICOAGULATION CONSULT NOTE - Initial Consult  Pharmacy Consult for Xarelto Indication: Hx of DVT  No Known Allergies  Patient Measurements: TBW 150.6 kg  Assessment: 56 yo F presents with CP. Hx of CTEPH and had diagnosis of PE in the past which she has been on Xarelto earlier this year. Last dose was given yesterday evening. Hgb 11.4, plts wnl.  Goal of Therapy:  Monitor platelets by anticoagulation protocol: Yes   Plan:  Continue Xarelto 20mg  PO daily Monitor CBC, s/s of bleed  Elenor Quinones, PharmD, [facility] Clinical Pharmacist Pager [phone] 08/20/2017 4:26 PM

## 2017-08-21 ENCOUNTER — Observation Stay (HOSPITAL_COMMUNITY): Payer: Medicare HMO

## 2017-08-21 ENCOUNTER — Other Ambulatory Visit (HOSPITAL_COMMUNITY): Payer: Self-pay

## 2017-08-21 ENCOUNTER — Other Ambulatory Visit: Payer: Self-pay

## 2017-08-21 ENCOUNTER — Encounter (HOSPITAL_COMMUNITY): Payer: Self-pay | Admitting: *Deleted

## 2017-08-21 DIAGNOSIS — I878 Other specified disorders of veins: Secondary | ICD-10-CM

## 2017-08-21 DIAGNOSIS — I4510 Unspecified right bundle-branch block: Secondary | ICD-10-CM

## 2017-08-21 DIAGNOSIS — R339 Retention of urine, unspecified: Secondary | ICD-10-CM | POA: Diagnosis not present

## 2017-08-21 DIAGNOSIS — G4733 Obstructive sleep apnea (adult) (pediatric): Secondary | ICD-10-CM | POA: Diagnosis present

## 2017-08-21 DIAGNOSIS — Z7951 Long term (current) use of inhaled steroids: Secondary | ICD-10-CM | POA: Diagnosis not present

## 2017-08-21 DIAGNOSIS — R0600 Dyspnea, unspecified: Secondary | ICD-10-CM

## 2017-08-21 DIAGNOSIS — Z801 Family history of malignant neoplasm of trachea, bronchus and lung: Secondary | ICD-10-CM | POA: Diagnosis not present

## 2017-08-21 DIAGNOSIS — F4321 Adjustment disorder with depressed mood: Secondary | ICD-10-CM | POA: Diagnosis present

## 2017-08-21 DIAGNOSIS — I2699 Other pulmonary embolism without acute cor pulmonale: Secondary | ICD-10-CM

## 2017-08-21 DIAGNOSIS — Z7901 Long term (current) use of anticoagulants: Secondary | ICD-10-CM

## 2017-08-21 DIAGNOSIS — F418 Other specified anxiety disorders: Secondary | ICD-10-CM

## 2017-08-21 DIAGNOSIS — Z6841 Body Mass Index (BMI) 40.0 and over, adult: Secondary | ICD-10-CM | POA: Diagnosis not present

## 2017-08-21 DIAGNOSIS — I4519 Other right bundle-branch block: Secondary | ICD-10-CM | POA: Diagnosis present

## 2017-08-21 DIAGNOSIS — I361 Nonrheumatic tricuspid (valve) insufficiency: Secondary | ICD-10-CM

## 2017-08-21 DIAGNOSIS — I110 Hypertensive heart disease with heart failure: Secondary | ICD-10-CM | POA: Diagnosis present

## 2017-08-21 DIAGNOSIS — I2724 Chronic thromboembolic pulmonary hypertension: Secondary | ICD-10-CM | POA: Diagnosis present

## 2017-08-21 DIAGNOSIS — I50810 Right heart failure, unspecified: Secondary | ICD-10-CM

## 2017-08-21 DIAGNOSIS — M797 Fibromyalgia: Secondary | ICD-10-CM | POA: Diagnosis present

## 2017-08-21 DIAGNOSIS — D508 Other iron deficiency anemias: Secondary | ICD-10-CM

## 2017-08-21 DIAGNOSIS — Z90710 Acquired absence of both cervix and uterus: Secondary | ICD-10-CM | POA: Diagnosis not present

## 2017-08-21 DIAGNOSIS — I5033 Acute on chronic diastolic (congestive) heart failure: Secondary | ICD-10-CM

## 2017-08-21 DIAGNOSIS — E785 Hyperlipidemia, unspecified: Secondary | ICD-10-CM | POA: Diagnosis present

## 2017-08-21 DIAGNOSIS — Z86711 Personal history of pulmonary embolism: Secondary | ICD-10-CM

## 2017-08-21 DIAGNOSIS — J9610 Chronic respiratory failure, unspecified whether with hypoxia or hypercapnia: Secondary | ICD-10-CM | POA: Diagnosis present

## 2017-08-21 DIAGNOSIS — F329 Major depressive disorder, single episode, unspecified: Secondary | ICD-10-CM | POA: Diagnosis present

## 2017-08-21 DIAGNOSIS — F419 Anxiety disorder, unspecified: Secondary | ICD-10-CM | POA: Diagnosis present

## 2017-08-21 DIAGNOSIS — R0789 Other chest pain: Secondary | ICD-10-CM | POA: Diagnosis not present

## 2017-08-21 DIAGNOSIS — G894 Chronic pain syndrome: Secondary | ICD-10-CM

## 2017-08-21 DIAGNOSIS — Z79899 Other long term (current) drug therapy: Secondary | ICD-10-CM | POA: Diagnosis not present

## 2017-08-21 DIAGNOSIS — K219 Gastro-esophageal reflux disease without esophagitis: Secondary | ICD-10-CM | POA: Diagnosis present

## 2017-08-21 DIAGNOSIS — D509 Iron deficiency anemia, unspecified: Secondary | ICD-10-CM | POA: Diagnosis present

## 2017-08-21 DIAGNOSIS — Z9049 Acquired absence of other specified parts of digestive tract: Secondary | ICD-10-CM | POA: Diagnosis not present

## 2017-08-21 DIAGNOSIS — R071 Chest pain on breathing: Secondary | ICD-10-CM

## 2017-08-21 DIAGNOSIS — Z9981 Dependence on supplemental oxygen: Secondary | ICD-10-CM

## 2017-08-21 DIAGNOSIS — Z9989 Dependence on other enabling machines and devices: Secondary | ICD-10-CM

## 2017-08-21 DIAGNOSIS — I509 Heart failure, unspecified: Secondary | ICD-10-CM

## 2017-08-21 DIAGNOSIS — I2721 Secondary pulmonary arterial hypertension: Secondary | ICD-10-CM | POA: Diagnosis not present

## 2017-08-21 DIAGNOSIS — Z9889 Other specified postprocedural states: Secondary | ICD-10-CM

## 2017-08-21 DIAGNOSIS — M0689 Other specified rheumatoid arthritis, multiple sites: Secondary | ICD-10-CM | POA: Diagnosis present

## 2017-08-21 DIAGNOSIS — I5030 Unspecified diastolic (congestive) heart failure: Secondary | ICD-10-CM | POA: Diagnosis not present

## 2017-08-21 HISTORY — DX: Heart failure, unspecified: I50.9

## 2017-08-21 LAB — CBC
HCT: 35.8 % — ABNORMAL LOW (ref 36.0–46.0)
Hemoglobin: 11.3 g/dL — ABNORMAL LOW (ref 12.0–15.0)
MCH: 20.5 pg — ABNORMAL LOW (ref 26.0–34.0)
MCHC: 31.6 g/dL (ref 30.0–36.0)
MCV: 64.9 fL — ABNORMAL LOW (ref 78.0–100.0)
Platelets: 306 10*3/uL (ref 150–400)
RBC: 5.52 MIL/uL — ABNORMAL HIGH (ref 3.87–5.11)
RDW: 17.7 % — ABNORMAL HIGH (ref 11.5–15.5)
WBC: 6.1 10*3/uL (ref 4.0–10.5)

## 2017-08-21 LAB — MRSA PCR SCREENING: MRSA by PCR: NEGATIVE

## 2017-08-21 LAB — COMPREHENSIVE METABOLIC PANEL
ALT: 14 U/L (ref 14–54)
AST: 22 U/L (ref 15–41)
Albumin: 3.2 g/dL — ABNORMAL LOW (ref 3.5–5.0)
Alkaline Phosphatase: 71 U/L (ref 38–126)
Anion gap: 10 (ref 5–15)
BUN: 12 mg/dL (ref 6–20)
CO2: 29 mmol/L (ref 22–32)
Calcium: 9 mg/dL (ref 8.9–10.3)
Chloride: 101 mmol/L (ref 101–111)
Creatinine, Ser: 1.06 mg/dL — ABNORMAL HIGH (ref 0.44–1.00)
GFR calc Af Amer: >60 mL/min (ref >60)
GFR calc non Af Amer: 58 mL/min — ABNORMAL LOW (ref >60)
Glucose, Bld: 125 mg/dL — ABNORMAL HIGH (ref 65–99)
Potassium: 3.5 mmol/L (ref 3.5–5.1)
Sodium: 140 mmol/L (ref 135–145)
Total Bilirubin: 0.2 mg/dL — ABNORMAL LOW (ref 0.3–1.2)
Total Protein: 6.2 g/dL — ABNORMAL LOW (ref 6.5–8.1)

## 2017-08-21 LAB — HIV ANTIBODY (ROUTINE TESTING W REFLEX): HIV Screen 4th Generation wRfx: NONREACTIVE

## 2017-08-21 LAB — ECHOCARDIOGRAM COMPLETE
Height: 63 in
Weight: 5326.31 oz

## 2017-08-21 LAB — GLUCOSE, CAPILLARY: Glucose-Capillary: 107 mg/dL — ABNORMAL HIGH (ref 65–99)

## 2017-08-21 LAB — TROPONIN I: Troponin I: <0.03 ng/mL (ref <0.03)

## 2017-08-21 MED ORDER — POTASSIUM CHLORIDE CRYS ER 20 MEQ PO TBCR
40.0000 meq | EXTENDED_RELEASE_TABLET | Freq: Two times a day (BID) | ORAL | Status: DC
  Administered 2017-08-21 – 2017-08-23 (×5): 40 meq via ORAL
  Filled 2017-08-21 (×5): qty 2

## 2017-08-21 MED ORDER — PERFLUTREN LIPID MICROSPHERE
1.0000 mL | INTRAVENOUS | Status: AC | PRN
  Administered 2017-08-21: 2 mL via INTRAVENOUS
  Filled 2017-08-21: qty 10

## 2017-08-21 MED ORDER — FUROSEMIDE 10 MG/ML IJ SOLN
80.0000 mg | Freq: Two times a day (BID) | INTRAMUSCULAR | Status: AC
  Administered 2017-08-21 – 2017-08-22 (×4): 80 mg via INTRAVENOUS
  Filled 2017-08-21 (×5): qty 8

## 2017-08-21 MED ORDER — MECLIZINE HCL 12.5 MG PO TABS
25.0000 mg | ORAL_TABLET | Freq: Two times a day (BID) | ORAL | Status: DC | PRN
  Administered 2017-08-21: 25 mg via ORAL
  Filled 2017-08-21: qty 2

## 2017-08-21 MED ORDER — ASPIRIN EC 81 MG PO TBEC
81.0000 mg | DELAYED_RELEASE_TABLET | Freq: Every day | ORAL | Status: DC
  Administered 2017-08-21 – 2017-08-22 (×2): 81 mg via ORAL
  Filled 2017-08-21 (×2): qty 1

## 2017-08-21 NOTE — Care Management Note (Signed)
Case Management Note  Patient Details  Name: [name] [name] MRN: [id number] Date of Birth: [birth year]  Subjective/Objective:    Pt admitted with CP                Action/Plan:   PTA independent from home - uses walker for assistance with ambulation.  Pt will travel home via private vehicle.  Pt has [facility] for HH and home oxygen baseline 2 liters- pt states she has portable oxygen tank for transport home.  CM will continue to follow for discharge needs   Expected Discharge Date:                  Expected Discharge Plan:  Home/Self Care  In-House Referral:     Discharge planning Services  CM Consult  Post Acute Care Choice:    Choice offered to:  Patient  DME Arranged:    DME Agency:     HH Arranged:  RN, PT, Nurse's Aide [place] Agency:  [place]  Status of Service:  In process, will continue to follow  If discussed at Long Length of Stay Meetings, dates discussed:    Additional Comments:  Maryclare Labrador, RN 08/21/2017, 4:25 PM

## 2017-08-21 NOTE — Progress Notes (Signed)
Orthopedic Tech Progress Note Patient Details:  [name] [name] 01-Jan-1961 [id number]  Ortho Devices Type of Ortho Device: Haematologist Ortho Device/Splint Location: bilateral Ortho Device/Splint Interventions: Application   Post Interventions Patient Tolerated: Well Instructions Provided: Care of device   Hildred Priest 08/21/2017, 12:26 PM

## 2017-08-21 NOTE — Progress Notes (Signed)
OT Cancellation Note  Patient Details Name: [name] [name] MRN: [id number] DOB: 05-31-1961   Cancelled Treatment:    Reason Eval/Treat Not Completed: Patient at procedure or test/ unavailable(echo).Will follow  Malka So 08/21/2017, 1:58 PM

## 2017-08-21 NOTE — Progress Notes (Signed)
Patient is well known to me from her involvement in the HF Community [facility] program and HF Clinic visits.  I will plan to visit patient while inpatient and she will remain enrolled in the HF [employer] program.

## 2017-08-21 NOTE — Progress Notes (Signed)
Responded to spiritual care concert to discuss AD with patient.  Explain to patient the essence of the document and informed her when she was ready to complete  form have nurse page chaplain. AD given to patient.Patient wanted to talk with family. Chaplain available as needed.  Jaclynn Major, [place], [facility], Pager [phone]

## 2017-08-21 NOTE — Care Management Obs Status (Signed)
[facility] NOTIFICATION   Patient Details  Name: [name] [name] MRN: [id number] Date of Birth: [birth year]   Medicare Observation Status Notification Given:  Yes    Maryclare Labrador, RN 08/21/2017, 4:27 PM

## 2017-08-21 NOTE — Progress Notes (Signed)
  Echocardiogram 2D Echocardiogram has been performed.  [name] [name] 08/21/2017, 3:02 PM

## 2017-08-21 NOTE — Plan of Care (Signed)
Continue current care plan 

## 2017-08-21 NOTE — Progress Notes (Signed)
Subjective: Pt still having some atypical chest pain worse with change in position.  She is unsure how much urine output she had yesterday with the IV lasix but she still feels it may be less than usual.    Objective:  Vital signs in last 24 hours: Vitals:   08/21/17 0019 08/21/17 0358 08/21/17 0733 08/21/17 0737  BP: 106/68 101/83 93/76 99/75   Pulse: 65 87 85 61  Resp: 14 16 17 19   Temp: 98.9 F (37.2 C) 98.1 F (36.7 C) 98.7 F (37.1 C) 97.8 F (36.6 C)  TempSrc: Axillary Axillary Oral Oral  SpO2: 100% 100% 97% 100%  Weight:      Height:       BMP Latest Ref Rng & Units 08/21/2017 08/20/2017 06/02/2017  Glucose 65 - 99 mg/dL 125(H) 92 92  BUN 6 - 20 mg/dL 12 12 12   Creatinine 0.44 - 1.00 mg/dL 1.06(H) 0.98 0.88  Sodium 135 - 145 mmol/L 140 139 141  Potassium 3.5 - 5.1 mmol/L 3.5 3.9 3.3(L)  Chloride 101 - 111 mmol/L 101 106 108  CO2 22 - 32 mmol/L 29 25 26   Calcium 8.9 - 10.3 mg/dL 9.0 9.1 9.7    Intake/Output Summary (Last 24 hours) at 08/21/2017 0849 Last data filed at 08/21/2017 0400 Gross per 24 hour  Intake 720 ml  Output -  Net 720 ml    Physical Exam:  Vitals:   08/21/17 0733 08/21/17 0737  BP: 93/76 99/75  Pulse: 85 61  Resp: 17 19  Temp: 98.7 F (37.1 C) 97.8 F (36.6 C)  SpO2: 97% 100%   Physical Exam  Constitutional: She is oriented to person, place, and time. No distress.  Neck: JVD (9cm) present.  Cardiovascular: Normal rate, regular rhythm and normal heart sounds. Exam reveals no gallop and no friction rub.  No murmur heard. Pulmonary/Chest: Effort normal and breath sounds normal. No respiratory distress. She has no wheezes. She has no rales. She exhibits no tenderness.  Abdominal: Soft. Bowel sounds are normal. She exhibits no distension and no mass. There is no tenderness. There is no rebound and no guarding.  Musculoskeletal:       Right lower leg: She exhibits edema.       Left lower leg: She exhibits edema.  Neurological: She is  alert and oriented to person, place, and time.  Skin: She is not diaphoretic.  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  10 lbs up from last dry weight.      -Will start with lasix 80mg  IV BID. -UNNA boots  -continue CPAP, home 2L oxygen -continue Adempas   Atypical Chest pain: symptoms not convincing for angina, neg EKG, neg troponin, pt does have risk factors.  Pleuritic pain but do not feel her clinical picture is compatible with PE.  Pt reports good compliance with xarelto as well.   -continuous cardiac monitoring -trend troponins, serial EKG's -I expect this chest pain will get better with diuresis and once the patient is less anxious.     OSA: chronic, stable, on CPAP    -continue cpap    Dispo: Anticipated discharge in approximately 2 day(s).   Katherine Roan, MD 08/21/2017, 8:48 AM  Vickki Muff MD PGY-1 Internal Medicine Pager # [phone]

## 2017-08-21 NOTE — Evaluation (Signed)
Physical Therapy Evaluation Patient Details Name: [name] [name] MRN: [id number] DOB: Jan 02, 1961 Today's Date: 08/21/2017   History of Present Illness  Pt admitted with dyspnea, chest pain secondary to CHF exacerbation and pulmonary HTN. 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 Pt is on 2L at baseline.    Clinical Impression  Pt presents with decreased activity tolerance and an overall decrease in functional mobility secondary to above. PTA, pt mod indep with SPC and furniture surfing; reports increasing difficulty with performing ADLs due to fatigue and SOB. Today, able to amb 200' with supervision, stability much improved with RW; required 2x standing rest breaks secondary to fatigue and DOE. Pt would benefit from continued acute PT services to maximize functional mobility and independence prior to d/c with continued HHPT services.     Follow Up Recommendations Home health PT;Supervision - Intermittent    Equipment Recommendations  None recommended by PT(owns DME)    Recommendations for Other Services       Precautions / Restrictions Precautions Precautions: Fall Restrictions Weight Bearing Restrictions: No      Mobility  Bed Mobility Overal bed mobility: Needs Assistance Bed Mobility: Sit to Supine       Sit to supine: Supervision      Transfers Overall transfer level: Needs assistance Equipment used: Straight cane Transfers: Sit to/from Stand Sit to Stand: Supervision         General transfer comment: increased time, supervision for safety  Ambulation/Gait Ambulation/Gait assistance: Supervision Ambulation Distance (Feet): 200 Feet Assistive device: Rolling walker (2 wheeled);None Gait Pattern/deviations: Step-through pattern;Decreased stride length;Wide base of support Gait velocity: Decreased Gait velocity interpretation: <1.8 ft/sec, indicative of risk for recurrent  falls General Gait Details: Amb in room with SPC and min guard. Amb an additional 200' with RW with much improved stability; supervision for safety. SpO2 down to 88% on 2L O2 [place]. 2x standing rest break secondary to fatigue and DOE  Financial trader [employer] (Stroke Patients Only)       Balance Overall balance assessment: Needs assistance   Sitting balance-Leahy Scale: Good       Standing balance-Leahy Scale: Poor Standing balance comment: pt admits to using cane and reaching for furniture at home, much safer with RW                             Pertinent Vitals/Pain Pain Assessment: Faces Faces Pain Scale: Hurts a little bit Pain Location: LEs with unna boots Pain Descriptors / Indicators: Tightness Pain Intervention(s): Monitored during session    Home Living Family/patient expects to be discharged to:: Private residence Living Arrangements: Spouse/significant other;Other relatives(grandson) Available Help at Discharge: Family;Available 24 hours/day Type of Home: House Home Access: Stairs to enter Entrance Stairs-Rails: Psychiatric nurse of Steps: 4 Home Layout: One level Home Equipment: Walker - 4 wheels;Shower seat;Cane - single point;Bedside commode;Adaptive equipment Additional Comments: Home O2    Prior Function Level of Independence: Needs assistance   Gait / Transfers Assistance Needed: walks with a cane  ADL's / Homemaking Assistance Needed: husband assists with showering, pt fatigues with LB bathing and dressing and showering        Hand Dominance   Dominant Hand: Right    Extremity/Trunk Assessment   Upper Extremity Assessment Upper Extremity Assessment: Overall WFL for tasks assessed  Lower Extremity Assessment Lower Extremity Assessment: Generalized weakness    Cervical / Trunk Assessment Cervical / Trunk Assessment: Normal  Communication   Communication: No difficulties   Cognition Arousal/Alertness: Awake/alert Behavior During Therapy: WFL for tasks assessed/performed Overall Cognitive Status: Within Functional Limits for tasks assessed                                        General Comments      Exercises     Assessment/Plan    PT Assessment Patient needs continued PT services  PT Problem List Decreased strength;Decreased activity tolerance;Decreased balance;Decreased mobility       PT Treatment Interventions Therapeutic activities;DME instruction;Gait training;Stair training;Therapeutic exercise;Functional mobility training;Balance training;Patient/family education    PT Goals (Current goals can be found in the Care Plan section)  Acute Rehab PT Goals Patient Stated Goal: to return home in time for grandson's birthday Sunday PT Goal Formulation: With patient Time For Goal Achievement: 09/04/17 Potential to Achieve Goals: Good    Frequency Min 3X/week   Barriers to discharge        Co-evaluation               AM-PAC PT "6 Clicks" Daily Activity  Outcome Measure Difficulty turning over in bed (including adjusting bedclothes, sheets and blankets)?: None Difficulty moving from lying on back to sitting on the side of the bed? : None Difficulty sitting down on and standing up from a chair with arms (e.g., wheelchair, bedside commode, etc,.)?: A Little Help needed moving to and from a bed to chair (including a wheelchair)?: A Little Help needed walking in hospital room?: A Little Help needed climbing 3-5 steps with a railing? : A Little 6 Click Score: 20    End of Session Equipment Utilized During Treatment: Gait belt;Oxygen Activity Tolerance: Patient tolerated treatment well Patient left: in chair;with call bell/phone within reach;with family/visitor present Nurse Communication: Mobility status PT Visit Diagnosis: Other abnormalities of gait and mobility (R26.89)    Time: 4287-6811 PT Time Calculation (min)  (ACUTE ONLY): 12 min   Charges:   PT Evaluation $PT Eval Moderate Complexity: 1 Mod     PT G Codes:       Mabeline Caras, PT, DPT Acute Rehab Services  Pager: [place] 08/21/2017, 5:51 PM

## 2017-08-21 NOTE — Evaluation (Signed)
Occupational Therapy Evaluation Patient Details Name: [name] [name] MRN: [id number] DOB: 1961/09/01 Today's Date: 08/21/2017    History of Present Illness Pt admitted with dyspnea, chest pain secondary to CHF exacerbation and pulmonary HTN. 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 Pt is on 2L at baseline.   Clinical Impression   Pt typically walks with a cane and reaches for furniture. Her husband helps her perform tub transfer and with seated showering. Pt reports being able to dress herself, but struggles with reaching her feet. Pt presents with decreased activity tolerance and impaired standing balance. She is safer using a RW which also provides energy conservation while pt ambulates. Pt was receiving HHPT at home, had not yet had a visit from [facility]. Will follow acutely.   Follow Up Recommendations  Home health OT    Equipment Recommendations       Recommendations for Other Services       Precautions / Restrictions Precautions Precautions: Fall Restrictions Weight Bearing Restrictions: No      Mobility Bed Mobility Overal bed mobility: Needs Assistance Bed Mobility: Sit to Supine       Sit to supine: Supervision      Transfers Overall transfer level: Needs assistance Equipment used: Straight cane Transfers: Sit to/from Stand Sit to Stand: Supervision         General transfer comment: increased time, supervision for safety    Balance Overall balance assessment: Needs assistance   Sitting balance-Leahy Scale: Good       Standing balance-Leahy Scale: Poor Standing balance comment: pt admits to using cane and reaching for furniture at home, much safer with RW                           ADL either performed or assessed with clinical judgement   ADL Overall ADL's : Needs assistance/impaired Eating/Feeding: Independent;Sitting   Grooming:  Supervision/safety;Standing   Upper Body Bathing: Set up;Sitting   Lower Body Bathing: Minimal assistance;Sit to/from stand   Upper Body Dressing : Set up;Sitting   Lower Body Dressing: Minimal assistance;Sit to/from stand   Toilet Transfer: Supervision/safety;Ambulation;RW;BSC   Toileting- Water quality scientist and Hygiene: Supervision/safety;Sit to/from Nurse, children's Details (indicate cue type and reason): pt is aware of tub transfer bench, but cannot afford Functional mobility during ADLs: Supervision/safety;Rolling walker General ADL Comments: Pt much safer with RW. Uses pursed lip breathing techniques during exertion.     Vision Baseline Vision/History: Wears glasses Patient Visual Report: No change from baseline       Perception     Praxis      Pertinent Vitals/Pain Pain Assessment: Faces Faces Pain Scale: Hurts a little bit Pain Location: LEs with unna boots Pain Descriptors / Indicators: Tightness Pain Intervention(s): Monitored during session;Repositioned     Hand Dominance Right   Extremity/Trunk Assessment Upper Extremity Assessment Upper Extremity Assessment: Overall WFL for tasks assessed   Lower Extremity Assessment Lower Extremity Assessment: Defer to PT evaluation       Communication Communication Communication: No difficulties   Cognition Arousal/Alertness: Awake/alert Behavior During Therapy: WFL for tasks assessed/performed Overall Cognitive Status: Within Functional Limits for tasks assessed                                     General Comments  Exercises     Shoulder Instructions      Home Living Family/patient expects to be discharged to:: Private residence Living Arrangements: Spouse/significant other;Other relatives(grandson) Available Help at Discharge: Family;Available 24 hours/day Type of Home: House Home Access: Stairs to enter [employer] of Steps: 4 Entrance Stairs-Rails:  Right;Left Home Layout: One level     Bathroom Shower/Tub: Teacher, early years/pre: Standard     Home Equipment: Environmental consultant - 4 wheels;Shower seat;Cane - single point;Bedside commode;Adaptive equipment(02) Adaptive Equipment: Reacher        Prior Functioning/Environment Level of Independence: Needs assistance  Gait / Transfers Assistance Needed: walks with a cane ADL's / Homemaking Assistance Needed: husband assists with showering, pt fatigues with LB bathing and dressing and showering            OT Problem List: Decreased activity tolerance;Impaired balance (sitting and/or standing);Decreased knowledge of use of DME or AE;Cardiopulmonary status limiting activity;Obesity;Pain      OT Treatment/Interventions: Self-care/ADL training;DME and/or AE instruction;Energy conservation;Balance training;Patient/family education    OT Goals(Current goals can be found in the care plan section) Acute Rehab OT Goals Patient Stated Goal: to return home in time for grandson's birthday Sunday OT Goal Formulation: With patient Time For Goal Achievement: 09/04/17 Potential to Achieve Goals: Good ADL Goals Pt Will Perform Grooming: (P) with modified independence;standing Pt Will Perform Lower Body Bathing: (P) with modified independence;with adaptive equipment Pt Will Perform Lower Body Dressing: (P) with modified independence;with adaptive equipment;sit to/from stand Pt Will Transfer to Toilet: (P) with modified independence;ambulating;bedside commode Pt Will Perform Toileting - Clothing Manipulation and hygiene: (P) with modified independence;sit to/from stand Additional ADL Goal #1: (P) Pt will state at least 3 energy conservation strategies as instructed.  OT Frequency: Min 2X/week   Barriers to D/C:            Co-evaluation              AM-PAC PT "6 Clicks" Daily Activity     Outcome Measure Help from another person eating meals?: None Help from another person taking  care of personal grooming?: A Little Help from another person toileting, which includes using toliet, bedpan, or urinal?: A Little Help from another person bathing (including washing, rinsing, drying)?: A Little Help from another person to put on and taking off regular upper body clothing?: None Help from another person to put on and taking off regular lower body clothing?: A Little 6 Click Score: 20   End of Session Equipment Utilized During Treatment: Gait belt;Rolling walker;Oxygen  Activity Tolerance: Patient tolerated treatment well Patient left: in chair;with call bell/phone within reach;with family/visitor present  OT Visit Diagnosis: Unsteadiness on feet (R26.81)(decreased activity tolerance)                Time: 0109-3235 OT Time Calculation (min): 15 min Charges:  OT General Charges $OT Visit: 1 Visit OT Evaluation $OT Eval Moderate Complexity: 1 Mod G-Codes: OT G-codes **NOT FOR INPATIENT CLASS** Functional Assessment Tool Used: Clinical judgement Functional Limitation: Self care Self Care Current Status (T7322): At least 20 percent but less than 40 percent impaired, limited or restricted Self Care Goal Status (G2542): At least 1 percent but less than 20 percent impaired, limited or restricted   Malka So 08/21/2017, 3:47 PM  08/21/2017 Nestor Lewandowsky, OTR/L Pager: [phone]

## 2017-08-21 NOTE — Progress Notes (Signed)
[facility]  Patient Status: Active (receiving services up to time of hospitalization)  [facility] is providing the following services: RN, PT and HHA  If patient discharges after hours, please call [phone].   [name] [name] 08/21/2017, 10:44 AM

## 2017-08-22 ENCOUNTER — Other Ambulatory Visit (HOSPITAL_COMMUNITY): Payer: Self-pay | Admitting: *Deleted

## 2017-08-22 ENCOUNTER — Encounter (HOSPITAL_COMMUNITY): Payer: Self-pay | Admitting: *Deleted

## 2017-08-22 ENCOUNTER — Encounter (HOSPITAL_COMMUNITY): Payer: Medicare HMO

## 2017-08-22 DIAGNOSIS — I5030 Unspecified diastolic (congestive) heart failure: Secondary | ICD-10-CM

## 2017-08-22 DIAGNOSIS — I2724 Chronic thromboembolic pulmonary hypertension: Secondary | ICD-10-CM

## 2017-08-22 DIAGNOSIS — I2721 Secondary pulmonary arterial hypertension: Secondary | ICD-10-CM

## 2017-08-22 DIAGNOSIS — R339 Retention of urine, unspecified: Secondary | ICD-10-CM

## 2017-08-22 DIAGNOSIS — I272 Pulmonary hypertension, unspecified: Secondary | ICD-10-CM

## 2017-08-22 LAB — BASIC METABOLIC PANEL
Anion gap: 6 (ref 5–15)
BUN: 16 mg/dL (ref 6–20)
CO2: 32 mmol/L (ref 22–32)
Calcium: 9.2 mg/dL (ref 8.9–10.3)
Chloride: 101 mmol/L (ref 101–111)
Creatinine, Ser: 1.03 mg/dL — ABNORMAL HIGH (ref 0.44–1.00)
GFR calc Af Amer: >60 mL/min (ref >60)
GFR calc non Af Amer: >60 mL/min (ref >60)
Glucose, Bld: 111 mg/dL — ABNORMAL HIGH (ref 65–99)
Potassium: 4.1 mmol/L (ref 3.5–5.1)
Sodium: 139 mmol/L (ref 135–145)

## 2017-08-22 LAB — GLUCOSE, CAPILLARY
Glucose-Capillary: 105 mg/dL — ABNORMAL HIGH (ref 65–99)
Glucose-Capillary: 114 mg/dL — ABNORMAL HIGH (ref 65–99)

## 2017-08-22 MED ORDER — FUROSEMIDE 40 MG PO TABS
40.0000 mg | ORAL_TABLET | Freq: Every day | ORAL | Status: DC
  Administered 2017-08-23: 40 mg via ORAL
  Filled 2017-08-22: qty 1

## 2017-08-22 MED ORDER — DOCUSATE SODIUM 100 MG PO CAPS
100.0000 mg | ORAL_CAPSULE | Freq: Two times a day (BID) | ORAL | Status: DC
  Administered 2017-08-22 – 2017-08-23 (×2): 100 mg via ORAL
  Filled 2017-08-22 (×2): qty 1

## 2017-08-22 MED ORDER — SPIRONOLACTONE 12.5 MG HALF TABLET
12.5000 mg | ORAL_TABLET | Freq: Every day | ORAL | Status: DC
  Administered 2017-08-22 – 2017-08-23 (×2): 12.5 mg via ORAL
  Filled 2017-08-22 (×2): qty 1

## 2017-08-22 MED ORDER — RIOCIGUAT 2 MG PO TABS
1.0000 mg | ORAL_TABLET | Freq: Three times a day (TID) | ORAL | Status: DC
  Administered 2017-08-22 – 2017-08-23 (×5): 1 mg via ORAL
  Filled 2017-08-22 (×5): qty 0.5

## 2017-08-22 NOTE — Progress Notes (Signed)
Occupational Therapy Treatment Patient Details Name: [name] [name] MRN: [id number] DOB: 01/25/1961 Today's Date: 08/22/2017    History of present illness Pt admitted with dyspnea, chest pain secondary to CHF exacerbation and pulmonary HTN. 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 Pt is on 2L at baseline.   OT comments  Pt educate in use of AE for LB bathing and dressing and in energy conservation strategies. Reinforced strategies with handout. Pt disappointed that her weight is up and she will be staying in the hospital longer. Continues to hope to make it home by Sunday for her grandson's 10th birthday.  Follow Up Recommendations  Home health OT    Equipment Recommendations  Hospital bed    Recommendations for Other Services      Precautions / Restrictions Precautions Precautions: Fall       Mobility Bed Mobility Overal bed mobility: Needs Assistance Bed Mobility: Supine to Sit;Sit to Supine     Supine to sit: Supervision Sit to supine: Supervision      Transfers                      Balance Overall balance assessment: Needs assistance   Sitting balance-Leahy Scale: Good                                     ADL either performed or assessed with clinical judgement   ADL Overall ADL's : Needs assistance/impaired     Grooming: Wash/dry hands;Wash/dry face;Sitting;Set up     Upper Body Bathing Details (indicate cue type and reason): educated in use of long handled bath sponge for back   Lower Body Bathing Details (indicate cue type and reason): educated in use of long handled bath sponge for feet and use of her reacher for drying       Lower Body Dressing Details (indicate cue type and reason): educated in use of reacher and sock aid for energy conservation               General ADL Comments: Pt educated in energy conservation, reinforced  with handout.     Vision       Perception     Praxis      Cognition Arousal/Alertness: Awake/alert Behavior During Therapy: WFL for tasks assessed/performed Overall Cognitive Status: Within Functional Limits for tasks assessed                                          Exercises     Shoulder Instructions       General Comments      Pertinent Vitals/ Pain       Pain Assessment: No/denies pain  Home Living                                          Prior Functioning/Environment              Frequency  Min 2X/week        Progress Toward Goals  OT Goals(current goals can now be found in the care plan section)  Progress towards OT goals: Progressing toward goals  Acute Rehab OT Goals Patient Stated  Goal: to return home in time for grandson's birthday Sunday OT Goal Formulation: With patient Time For Goal Achievement: 09/04/17 Potential to Achieve Goals: Good  Plan Discharge plan remains appropriate    Co-evaluation                 AM-PAC PT "6 Clicks" Daily Activity     Outcome Measure   Help from another person eating meals?: None Help from another person taking care of personal grooming?: A Little Help from another person toileting, which includes using toliet, bedpan, or urinal?: A Little Help from another person bathing (including washing, rinsing, drying)?: A Little Help from another person to put on and taking off regular upper body clothing?: None Help from another person to put on and taking off regular lower body clothing?: A Little 6 Click Score: 20    End of Session Equipment Utilized During Treatment: Oxygen  OT Visit Diagnosis: Unsteadiness on feet (R26.81)(decreased activity tolerance)   Activity Tolerance (pt with nausea, awaiting her breakfast tray)   Patient Left in bed;with call bell/phone within reach   Nurse Communication          Time: [phone] OT Time Calculation (min): 13  min  Charges: OT General Charges $OT Visit: 1 Visit OT Treatments $Self Care/Home Management : 8-22 mins  08/22/2017 Nestor Lewandowsky, OTR/L Pager: [phone]   Malka So 08/22/2017, 9:12 AM

## 2017-08-22 NOTE — Progress Notes (Signed)
Subjective: Pt still having some atypical chest pain worse with change in position. She does not feel short of breath but did after working with PT yesterday but no more than she usually would with exerting herself.  She feels like she didn't have adequate urine output yesterday.  Additionally, she reports new symptoms of pain from bladder fullness and not being able void.    Objective:  Vital signs in last 24 hours: Vitals:   08/21/17 2010 08/22/17 0015 08/22/17 0359 08/22/17 0838  BP: 106/75 107/69 96/67 115/66  Pulse: 84 78 70   Resp: 12 12 13    Temp: 98.4 F (36.9 C) 98.5 F (36.9 C) 98 F (36.7 C) 97.9 F (36.6 C)  TempSrc: Oral Oral Oral Oral  SpO2: 100% 98% 98%   Weight:   (!) 336 lb 6.8 oz (152.6 kg)   Height:       BMP Latest Ref Rng & Units 08/22/2017 08/21/2017 08/20/2017  Glucose 65 - 99 mg/dL 111(H) 125(H) 92  BUN 6 - 20 mg/dL 16 12 12   Creatinine 0.44 - 1.00 mg/dL 1.03(H) 1.06(H) 0.98  Sodium 135 - 145 mmol/L 139 140 139  Potassium 3.5 - 5.1 mmol/L 4.1 3.5 3.9  Chloride 101 - 111 mmol/L 101 101 106  CO2 22 - 32 mmol/L 32 29 25  Calcium 8.9 - 10.3 mg/dL 9.2 9.0 9.1    Intake/Output Summary (Last 24 hours) at 08/22/2017 1118 Last data filed at 08/22/2017 1000 Gross per 24 hour  Intake 840 ml  Output 2150 ml  Net -1310 ml    Physical Exam:  Vitals:   08/22/17 0359 08/22/17 0838  BP: 96/67 115/66  Pulse: 70   Resp: 13   Temp: 98 F (36.7 C) 97.9 F (36.6 C)  SpO2: 98%    Physical Exam  Constitutional: She is oriented to person, place, and time. No distress.  Neck: JVD (8cm) present.  Cardiovascular: Normal rate, regular rhythm and normal heart sounds. Exam reveals no gallop and no friction rub.  No murmur heard. Pulmonary/Chest: Effort normal and breath sounds normal. No respiratory distress. She has no wheezes. She has no rales. She exhibits no tenderness.  Abdominal: Soft. Bowel sounds are normal. She exhibits no distension and no mass. There  is no tenderness. There is no rebound and no guarding.  Musculoskeletal:       Right lower leg: UNNA boots.       Left lower leg: UNNA boots.  Neurological: She is alert and oriented to person, place, and time.  Skin: She is not diaphoretic.  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  10 lbs up from last dry weight.     -ECHO returned unchanged from last fairly normal -Is/O's not accurate due to purewick not working well, weight not accurate as well, try for standing weight.  JVD appears unchanged, takes a long time to locate due to habitus.   -Pt received first dose of lasix 80mg  BID this am, bladder  scan showed no urinary retention 2L output this am will continue BID 80mg  dosing   -continue UNNA boots  -continue CPAP, home 2L oxygen -continue Adempas   Atypical Chest pain: symptoms not convincing for angina, neg EKG, neg troponin, pt does have risk factors.  Pleuritic pain but do not feel her clinical picture is compatible with PE.  Pt reports good compliance with xarelto as well.   -ACS workup neg, seems more typical of her chronic pain   OSA: chronic, stable, on CPAP    -continue cpap    Dispo: Anticipated discharge in approximately 2 day(s).   Katherine Roan, MD 08/22/2017, 11:18 AM Vickki Muff MD PGY-1 Internal Medicine Pager # [phone]

## 2017-08-22 NOTE — Progress Notes (Signed)
Physical Therapy Treatment Patient Details Name: [name] [name] MRN: [id number] DOB: 12-May-1961 Today's Date: 08/22/2017    History of Present Illness Pt admitted with dyspnea, chest pain secondary to CHF exacerbation and pulmonary HTN. 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 Pt is on 2L at baseline.   PT Comments    Pt progressing well with mobility. Very motivated to participate. Able to amb 400' with RW, requiring supervision and intermittent rest breaks due to fatigue and DOE. Initiated stair training with BUE rail support. SpO2 >90% on RA throughout. From a mobility perspective, feel pt is safe to return home with use of RW, supervision, and continued HHPT. Will continue to follow acutely to maximize functional mobility and independence.   Follow Up Recommendations  Home health PT;Supervision - Intermittent     Equipment Recommendations  None recommended by PT    Recommendations for Other Services       Precautions / Restrictions Precautions Precautions: Fall Restrictions Weight Bearing Restrictions: No    Mobility  Bed Mobility Overal bed mobility: Needs Assistance Bed Mobility: Supine to Sit     Supine to sit: Supervision;HOB elevated        Transfers Overall transfer level: Needs assistance Equipment used: Rolling walker (2 wheeled) Transfers: Sit to/from Stand Sit to Stand: Supervision            Ambulation/Gait Ambulation/Gait assistance: Supervision Ambulation Distance (Feet): 400 Feet Assistive device: Rolling walker (2 wheeled) Gait Pattern/deviations: Step-through pattern;Decreased stride length;Wide base of support Gait velocity: Decreased Gait velocity interpretation: <1.8 ft/sec, indicative of risk for recurrent falls General Gait Details: Slow, controlled amb with RW and supervision for safety. Intermittent standing rest breaks secondary to fatigue and  DOE; pt with good pursed lip breathing technique. SpO2 >90% on RA throughout   Stairs Stairs: Yes   Stair Management: One rail Left;Sideways;Step to pattern Number of Stairs: 6 General stair comments: Ascend/descended 6 steps with BUE support on L-side rail; supervision for safety. Increased time and effort, having to rest each step  Wheelchair Mobility    Modified Rankin (Stroke Patients Only)       Balance Overall balance assessment: Needs assistance   Sitting balance-Leahy Scale: Good       Standing balance-Leahy Scale: Fair Standing balance comment: Can static stand with no UE support; reliant on support for dynamic                            Cognition Arousal/Alertness: Awake/alert Behavior During Therapy: WFL for tasks assessed/performed Overall Cognitive Status: Within Functional Limits for tasks assessed                                        Exercises      General Comments        Pertinent Vitals/Pain Pain Assessment: Faces Faces Pain Scale: Hurts a little bit Pain Location: L side Pain Descriptors / Indicators: Sore Pain Intervention(s): Monitored during session    Home Living                      Prior Function            PT Goals (current goals can now be found in the care plan section) Acute Rehab PT Goals Patient Stated Goal: to return home  in time for grandson's birthday Sunday PT Goal Formulation: With patient Time For Goal Achievement: 09/04/17 Potential to Achieve Goals: Good Progress towards PT goals: Progressing toward goals    Frequency    Min 3X/week      PT Plan Current plan remains appropriate    Co-evaluation              AM-PAC PT "6 Clicks" Daily Activity  Outcome Measure  Difficulty turning over in bed (including adjusting bedclothes, sheets and blankets)?: None Difficulty moving from lying on back to sitting on the side of the bed? : None Difficulty sitting down on  and standing up from a chair with arms (e.g., wheelchair, bedside commode, etc,.)?: A Little Help needed moving to and from a bed to chair (including a wheelchair)?: A Little Help needed walking in hospital room?: A Little Help needed climbing 3-5 steps with a railing? : A Little 6 Click Score: 20    End of Session Equipment Utilized During Treatment: Gait belt Activity Tolerance: Patient tolerated treatment well Patient left: in chair;with call bell/phone within reach;Other (comment)(with MD present) Nurse Communication: Mobility status PT Visit Diagnosis: Other abnormalities of gait and mobility (R26.89)     Time: 4695-0722 PT Time Calculation (min) (ACUTE ONLY): 29 min  Charges:  $Gait Training: 23-37 mins                    G Codes:      Mabeline Caras, PT, DPT Acute Rehab Services  Pager: [place] 08/22/2017, 2:23 PM

## 2017-08-22 NOTE — Plan of Care (Signed)
Bladder scanned - too low to detect, pt bathed and able to ambulate to [facility]

## 2017-08-22 NOTE — Discharge Instructions (Addendum)
Pulmonary Artery Catheterization Pulmonary artery catheterization is a procedure that is used to test blood movement through the heart and to monitor the heart's function. In this procedure, a thin, flexible tube (catheter) is passed into the right side of the heart and into the main artery that carries blood from your heart to your lungs (pulmonary artery). The procedure may be used to evaluate or help diagnose various problems, such as:  Heart failure.  Shock.  Leaky heart valves (valvular regurgitation).  Congenital heart disease.  Burns.  Kidney disease.  High blood pressure within the arteries in the lungs (pulmonary hypertension).  A buildup of fluid around the heart that prevents the heart from functioning normally (cardiac tamponade).  A disease that causes the heart muscle to become rigid (restrictive cardiomyopathy).  Abnormal blood flow between two areas of the heart (shunt).  After a heart attack, this procedure may be used to monitor for further problems and to see if medicines are working. The procedure may be done in a cardiac catheterization lab or in an intensive care unit (ICU). Tell a health care provider about:  Any allergies you have.  All medicines you are taking, including vitamins, herbs, eye drops, creams, and over-the-counter medicines.  Any problems you or family members have had with anesthetic medicines.  Any blood disorders you have.  Any surgeries you have had.  Any medical conditions you have. What are the risks? Generally, this is a safe procedure. However, problems may occur, including:  Bruising or bleeding at the catheter insertion site.  Injury to the vein where the catheter was inserted.  Puncture to the lung. This is a risk if neck or chest veins are used.  The following problems may also occur, but they are very rare:  Abnormal heart rhythms.  Low blood pressure.  Infection.  Cardiac tamponade.  Blocked blood vessel  caused by a blood clot or foreign material circulating in the blood (embolism). This can be caused by blood clots at the tip of the catheter.  What happens before the procedure?  Follow instructions from your health care provider about eating or drinking restrictions.  Ask your health care provider about: ? Changing or stopping your regular medicines. This is especially important if you are taking diabetes medicines or blood thinners. ? Taking medicines such as aspirin and ibuprofen. These medicines can thin your blood. Do not take these medicines before your procedure if your health care provider instructs you not to. What happens during the procedure?  An IV tube will be inserted into one of your veins.  You may be given a medicine that helps you relax (sedative).  The area of your body that is chosen for insertion of the catheter will be cleaned. This is usually the neck or groin, but the insertion is sometimes done in another area. ? You will be given a medicine that numbs this area (local anesthetic). ? A small incision will be made in a vein in this area.  A catheter will be inserted through the incision and into the vein. The health care provider will carefully move the catheter into the upper chamber of the heart (right atrium). X-rays may be used to help guide the catheter to the right place.  The catheter will be threaded through two heart valves (tricuspid valve and pulmonary valve) and placed into the pulmonary artery.  As soon as the catheter is in place, the blood pressure in the pulmonary artery will be measured.  During the procedure,  your heart's rhythm will be watched constantly using an electrocardiogram (ECG).  The catheter will be removed after tests and monitoring have been completed. The procedure may vary among health care providers and hospitals. What happens after the procedure?  Your blood pressure, heart rate, breathing rate, and blood oxygen level will be  monitored often until the medicines you were given have worn off. This information is not intended to replace advice given to you by your health care provider. Make sure you discuss any questions you have with your health care provider. Document Released: 12/30/2006 Document Revised: 02/01/2016 Document Reviewed: 08/30/2014 Elsevier Interactive Patient Education  2018 [place] on my medicine - XARELTO (rivaroxaban)  [place]? Xarelto was prescribed to treat blood clots that may have been found in the veins of your legs (deep vein thrombosis) or in your lungs (pulmonary embolism) and to reduce the risk of them occurring again.  What do you need to know about Xarelto? The dose is one 20 mg tablet taken ONCE A DAY with your evening meal.  DO NOT stop taking Xarelto without talking to the health care provider who prescribed the medication.  Refill your prescription for 20 mg tablets before you run out.  After discharge, you should have regular check-up appointments with your healthcare provider that is prescribing your Xarelto.  In the future your dose may need to be changed if your kidney function changes by a significant amount.  What do you do if you miss a dose? If you are taking Xarelto TWICE DAILY and you miss a dose, take it as soon as you remember. You may take two 15 mg tablets (total 30 mg) at the same time then resume your regularly scheduled 15 mg twice daily the next day.  If you are taking Xarelto ONCE DAILY and you miss a dose, take it as soon as you remember on the same day then continue your regularly scheduled once daily regimen the next day. Do not take two doses of Xarelto at the same time.   Important Safety Information Xarelto is a blood thinner medicine that can cause bleeding. You should call your healthcare provider right away if you experience any of the following: ? Bleeding from an injury or your nose that does not  stop. ? Unusual colored urine (red or dark brown) or unusual colored stools (red or black). ? Unusual bruising for unknown reasons. ? A serious fall or if you hit your head (even if there is no bleeding).  Some medicines may interact with Xarelto and might increase your risk of bleeding while on Xarelto. To help avoid this, consult your healthcare provider or pharmacist prior to using any new prescription or non-prescription medications, including herbals, vitamins, non-steroidal anti-inflammatory drugs (NSAIDs) and supplements.  This website has more information on Xarelto: [email or web address]

## 2017-08-22 NOTE — Consult Note (Signed)
Advanced Heart Failure Team Consult Note  Primary Cardiologist:  Dr. Haroldine Laws   Reason for Consultation: A/C diastolic CHF/ CP  HPI:    Sky Primo is seen today for evaluation of A/C diastolic CHF/ CP at the request of Dr. Heber Southlake.   Donnette Macmullen is a 56 y.o. 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   Last seen in HF clinic 07/2017. Adempas cut back with dizziness. Otherwise stable.   Pt presented to [facility] 08/20/17 with SOB and chest discomfort.  She had taken extra lasix over the snow storm with no real relief. Had recent sick contact. EKG unremarkable as below. Echo with normal EF, though PA pressures could not be estimated. Pertinent labs on admission include Cr 0.98, K 3.9, BNP 12.4. WBC 5.7. CXR unremarkable. Noted to be 10 lbs from previous "dry weight" so started on IV lasix 80 mg BID  Pt feeling better, but remains anxious.  Was concerned when started on ASA, as she has been told she did not need previously while on Xarelto and requested the HF team come see her.  Swelling continues to improve. PT walked pt 250 ft without O2 and patient fared well. She denies SOB or lightheadedness at this time. States she feels like lasix usually works for her, she just had trouble this week with fluid and SOB. Chest pressure worth with anxiety. Has been quiescent thus far. Feeling much better overall.   08/21/17 Echo 60-65%.RV ok. PA pressures could not be estimated.  Bed weights inaccurate.   Review of Systems: [y] = yes, [ ]  = no   General: Weight gain [y]; Weight loss [ ] ; Anorexia [ ] ; Fatigue [ ] ; Fever [ ] ; Chills [ ] ; Weakness [ ]   Cardiac: Chest pain/pressure [y]; Resting SOB [ ] ; Exertional SOB [y]; Orthopnea [ ] ; Pedal Edema [y]; Palpitations [ ] ; Syncope [ ] ; Presyncope [ ] ; Paroxysmal nocturnal dyspnea[  ]  Pulmonary: Cough [y]; Wheezing[ ] ; Hemoptysis[ ] ; Sputum [ ] ; Snoring [ ]   GI: Vomiting[ ] ; Dysphagia[ ] ; Melena[ ] ; Hematochezia [ ] ; Heartburn[ ] ; Abdominal pain [ ] ; Constipation [ ] ; Diarrhea [ ] ; BRBPR [ ]   GU: Hematuria[ ] ; Dysuria [ ] ; Nocturia[ ]   Vascular: Pain in legs with walking [ ] ; Pain in feet with lying flat [ ] ; Non-healing sores [ ] ; Stroke [ ] ; TIA [ ] ; Slurred speech [ ] ;  Neuro: Headaches[ ] ; Vertigo[ ] ; Seizures[ ] ; Paresthesias[ ] ;Blurred vision [ ] ; Diplopia [ ] ; Vision changes [ ]   Ortho/Skin: Arthritis [y]; Joint pain [y]; Muscle pain [ ] ; Joint swelling [ ] ; Back Pain [ ] ; Rash [ ]   Psych: Depression[ ] ; Anxiety[ ]   Heme: Bleeding problems [ ] ; Clotting disorders [ ] ; Anemia [ ]   Endocrine: Diabetes [ ] ; Thyroid dysfunction[ ]   Home Medications Prior to Admission medications   Medication Sig Start Date End Date Taking? Authorizing Provider  acetaminophen-codeine (TYLENOL #4) 300-60 MG tablet Take 1 tablet by mouth every 8 (eight) hours as needed for moderate pain. 06/30/17  Yes Ladell Pier, MD  albuterol (PROVENTIL HFA;VENTOLIN HFA) 108 (90 Base) MCG/ACT inhaler Inhale 1-2 puffs into the lungs every 6 (six) hours as needed for wheezing or shortness of breath. 04/10/17  Yes Ladell Pier, MD  amitriptyline (ELAVIL) 10 MG tablet Take 1 tablet (10 mg total)  by mouth at bedtime. 06/30/17  Yes Ladell Pier, MD  atorvastatin (LIPITOR) 20 MG tablet Take 1 tablet (20 mg total) by mouth daily at 6 PM. 01/27/17  Yes Langeland, Dawn T, MD  cyclobenzaprine (FLEXERIL) 5 MG tablet Take 1 tablet (5 mg total) by mouth 3 (three) times daily as needed. for muscle spams 07/30/17  Yes Ladell Pier, MD  docusate sodium (COLACE) 100 MG capsule Take 1 capsule (100 mg total) by mouth every 12 (twelve) hours. 10/24/16  Yes Margarita Mail, PA-C  ferrous sulfate (FERROUSUL) 325 (65 FE) MG tablet Take 1 tablet (325 mg total) by mouth daily with breakfast. 12/25/16  Yes  Langeland, Dawn T, MD  fluticasone (FLONASE) 50 MCG/ACT nasal spray Place 2 sprays into both nostrils daily. Patient taking differently: Place 2 sprays into both nostrils daily as needed for allergies.  02/04/17  Yes Langeland, Dawn T, MD  furosemide (LASIX) 40 MG tablet Take 1 tablet (40 mg total) by mouth daily. Take extra 40 mg tablet once in the afternoon AS NEEDED for weight gain 3 lbs or more. 01/27/17  Yes Langeland, Dawn T, MD  gabapentin (NEURONTIN) 300 MG capsule Take 2 capsules (600 mg total) by mouth 3 (three) times daily. 06/13/17  Yes Ladell Pier, MD  loratadine (CLARITIN) 10 MG tablet Take 1 tablet (10 mg total) by mouth daily. 03/03/17  Yes McClung, Dionne Bucy, PA-C  meclizine (ANTIVERT) 25 MG tablet TAKE ONE TABLET BY MOUTH THREE TIMES DAILY AS NEEDED FOR  DIZZINESS 06/27/17  Yes Arbutus Leas, NP  omeprazole (PRILOSEC) 20 MG capsule Take 1 capsule (20 mg total) by mouth daily. 06/13/17  Yes Ladell Pier, MD  potassium chloride (K-DUR,KLOR-CON) 10 MEQ tablet Take 20 mEq by mouth 2 (two) times daily.   Yes [provider]  Riociguat (ADEMPAS) 1 MG TABS Take 1 mg 3 (three) times daily by mouth. 07/29/17  Yes Shylee Durrett, Shaune Pascal, MD  rivaroxaban (XARELTO) 20 MG TABS tablet Take 1 tablet (20 mg total) by mouth daily with supper. 01/27/17  Yes Maren Reamer, MD    Past Medical History: Past Medical History:  Diagnosis Date  . Arthritis   . CHF exacerbation ([place]) 08/21/2017  . Depression   . Diverticulitis   . Hyperlipidemia   . Hypertension   . Obesity   . Pneumonia   . Pulmonary embolism [facility])     Past Surgical History: Past Surgical History:  Procedure Laterality Date  . ABDOMINAL HYSTERECTOMY    . BREAST REDUCTION SURGERY    . CHOLECYSTECTOMY    . EMBOLECTOMY N/A    pulmonary embolectomy  . REDUCTION MAMMAPLASTY Bilateral   . RIGHT HEART CATH N/A 10/28/2016   Procedure: Right Heart Cath;  Surgeon: Jolaine Artist, MD;  Location: [place] [facility]  [facility];  Service: Cardiovascular;  Laterality: N/A;    Family History: Family History  Problem Relation Age of Onset  . Lung cancer Maternal Grandmother   . Dementia Mother   . Dementia Father   . Anxiety disorder Sister   . Bipolar disorder Sister   . Drug abuse Sister   . Sexual abuse Maternal Aunt   . Anxiety disorder Cousin   . Anxiety disorder Sister   . Bipolar disorder Sister   . Drug abuse Sister   . Breast cancer Neg Hx     Social History: Social History   Socioeconomic History  . Marital status: Married    Spouse name: None  . Number  of children: None  . Years of education: None  . Highest education level: None  Social Needs  . Financial resource strain: None  . Food insecurity - worry: None  . Food insecurity - inability: None  . Transportation needs - medical: None  . Transportation needs - non-medical: None  Occupational History  . None  Tobacco Use  . Smoking status: Never Smoker  . Smokeless tobacco: Never Used  Substance and Sexual Activity  . Alcohol use: No  . Drug use: No  . Sexual activity: Yes    Partners: Male    Birth control/protection: Surgical  Other Topics Concern  . None  Social History Narrative  . None    Allergies:  No Known Allergies  Objective:    Vital Signs:   Temp:  [97.9 F (36.6 C)-98.5 F (36.9 C)] 98.1 F (36.7 C) (12/14 1202) Pulse Rate:  [70-95] 95 (12/14 1200) Resp:  [12-17] 17 (12/14 1200) BP: (96-115)/(66-78) 112/77 (12/14 1200) SpO2:  [98 %-100 %] 100 % (12/14 1200) Weight:  [336 lb 6.8 oz (152.6 kg)] 336 lb 6.8 oz (152.6 kg) (12/14 0359) Last BM Date: 08/18/17  Weight change: Filed Weights   08/20/17 1815 08/22/17 0359  Weight: (!) 332 lb 14.3 oz (151 kg) (!) 336 lb 6.8 oz (152.6 kg)   Intake/Output:   Intake/Output Summary (Last 24 hours) at 08/22/2017 1415 Last data filed at 08/22/2017 1140 Gross per 24 hour  Intake 840 ml  Output 2800 ml  Net -1960 ml     Physical Exam    General:   Well appearing. Seated in chair. No resp difficulty HEENT: normal Neck: supple. JVP difficult to assess with body habitus - looks ok. Carotids 2+ bilat; no bruits. No lymphadenopathy or thyromegaly appreciated. Cor: PMI nondisplaced. Regular rate & rhythm. No rubs, gallops or murmurs. Lungs: Slightly diminished basilar sounds, otherwise clear. No wheeze. Abdomen: obese soft, nontender, nondistended. No hepatosplenomegaly. No bruits or masses. Good bowel sounds. Extremities: no cyanosis, clubbing, or rash. Trace to 1+ edema with UNNA boots in place.  Neuro: alert & orientedx3, cranial nerves grossly intact. moves all 4 extremities w/o difficulty. Affect pleasant  Telemetry   Sinus tach 100s, personally reviewed  EKG    NSR 65 bpm, personally reviewed  Labs   Basic Metabolic Panel: Recent Labs  Lab 08/20/17 1301 08/21/17 0528 08/22/17 0700  NA 139 140 139  K 3.9 3.5 4.1  CL 106 101 101  CO2 25 29 32  GLUCOSE 92 125* 111*  BUN 12 12 16   CREATININE 0.98 1.06* 1.03*  CALCIUM 9.1 9.0 9.2    Liver Function Tests: Recent Labs  Lab 08/20/17 1301 08/21/17 0528  AST 17 22  ALT 13* 14  ALKPHOS 78 71  BILITOT 0.6 0.2*  PROT 6.6 6.2*  ALBUMIN 3.3* 3.2*   No results for input(s): LIPASE, AMYLASE in the last 168 hours. No results for input(s): AMMONIA in the last 168 hours.  CBC: Recent Labs  Lab 08/20/17 1301 08/21/17 0426  WBC 5.7 6.1  HGB 11.4* 11.3*  HCT 34.8* 35.8*  MCV 64.7* 64.9*  PLT 274 306    Cardiac Enzymes: Recent Labs  Lab 08/20/17 1554 08/20/17 2154 08/21/17 0528  TROPONINI <0.03 <0.03 <0.03    BNP: BNP (last 3 results) Recent Labs    06/02/17 1856 08/20/17 1301  BNP 13.5 12.4    ProBNP (last 3 results) No results for input(s): PROBNP in the last 8760 hours.   CBG: Recent Labs  Lab 08/21/17 0840 08/22/17 0839  GLUCAP 107* 105*    Coagulation Studies: No results for input(s): LABPROT, INR in the last 72 hours.   Imaging     No results found.   Medications:     Current Medications: . amitriptyline  10 mg Oral QHS  . aspirin  324 mg Oral Once  . aspirin EC  81 mg Oral Daily  . atorvastatin  20 mg Oral q1800  . docusate sodium  100 mg Oral Q12H  . furosemide  80 mg Intravenous BID  . gabapentin  600 mg Oral TID  . potassium chloride  40 mEq Oral BID  . Riociguat  1 mg Oral TID  . rivaroxaban  20 mg Oral Q supper  . sodium chloride flush  3 mL Intravenous Q12H     Infusions:     Patient Profile   Lorie Cleckley is a 56 y.o. 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   Admitted with SOB and chest discomfort in the setting of volume overloaded. Troponin negative. Responded well to IV lasix.   Assessment/Plan   1. 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 Would not use ASA.  - RHC in 2/18 with PA pressures in the 60s. PCW 9.  Spoke with patient. Will plan repeat RHC as OUTPATIENT next week.  - Echo 8/18 with no evidence RV strain or PAH (RVSP estimated 55mmHG)  -CT angio at [facility] on 05/15/16 showed "persistent but partially recanalized PE within right lower lobe pulmonary artery. No new PE identified." - VQ 10/17 positive for residual PE right lateral lung - Continue adempas to 1 tid and reassess symptoms (both dizziness and SOB).  - Will consider macitentan based on cath results next week.  - OK to give IV lasix x 1 more dose this evening. Pt feeling much better. Bed weights inaccurate.  - Tomorrow transition back to lasix 40 mg daily. Can use extra 40 mg in evening as needed - Start spiro 12.5 mg daily today.   2. CP - Atypical. Suspect in setting of volume overloaded.  - Now resolved. Troponin  negative.  - Continue to follow.   3. Chronic respiratory failure - Continue home oxygen.  - Encouraged weight loss. - Recently referred to [facility] home Pulmonary/Cardiac rehab program  4. Fibromyalgia/Rheumatoid Arthitis:  - Follows with Rheumatology. No change.   5. Morbid obesity with OHS:  - Again discussed importance of weight loss.   6. OSA - Reports compliance with CPAP.   7. Depression/anxiety - Major issue for her. She has been mostly stable this admission.   OK to give one more dose IV lasix with residual volume overloaded. Resume home lasix tomorrow with additional 40 mg as needed. Start spiro. Plan for RHC next week, and close follow up as outpatient.   RHC discussed with patient and scheduled. Instructions left in chart for RN to print off and review with pt.   Medication concerns reviewed with patient and pharmacy team. Barriers identified: Non at this time, although patient does have questionable compliance at times.   Length of Stay: 1  Annamaria Helling  08/22/2017, 2:15 PM  Advanced Heart Failure Team Pager [phone] (M-F; 7a - 4p)  Please  contact [facility] for night-coverage after hours (4p -7a ) and weekends on [email or web address]  Patient seen and examined with the above-signed Advanced Practice Provider and/or Housestaff. I personally reviewed laboratory data, imaging studies and relevant notes. I independently examined the patient and formulated the important aspects of the plan. I have edited the note to reflect any of my changes or salient points. I have personally discussed the plan with the patient and/or family.  Patient well known to me from [facility]. 56 y/o woman with morbid obesity and CTEPH s/p pulmonary throboembolectomy with residual PAH. Has been on adempas but has had trouble tolerating due to hypotension. Now on 1mg  tid (previously was on 3mg  tid).   Admitted with volume overload and decreased lasix responsiveness. Has  responded well to IV diuresis. Now euvolemic. Can switch back to lasix 40 daily (increase to lasix 80 daily as needed). Will add spiro. If not diuresing well we can switch to torsemide. Will plan outpatient RHC next week with possible addition of macitentan.  We will s/o. Please call with questions.   Glori Bickers, MD  4:26 PM

## 2017-08-22 NOTE — H&P (View-Only) (Signed)
Advanced Heart Failure Team Consult Note  Primary Cardiologist:  Dr. Haroldine Laws   Reason for Consultation: A/C diastolic CHF/ CP  HPI:    [name] [name] is seen today for evaluation of A/C diastolic CHF/ CP at the request of Dr. He[place].   [name] [name] is a 56 y.o. 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   Last seen in HF clinic 07/2017. Adempas cut back with dizziness. Otherwise stable.   Pt presented to [facility] 08/20/17 with SOB and chest discomfort.  She had taken extra lasix over the snow storm with no real relief. Had recent sick contact. EKG unremarkable as below. Echo with normal EF, though PA pressures could not be estimated. Pertinent labs on admission include Cr 0.98, K 3.9, BNP 12.4. WBC 5.7. CXR unremarkable. Noted to be 10 lbs from previous "dry weight" so started on IV lasix 80 mg BID  Pt feeling better, but remains anxious.  Was concerned when started on ASA, as she has been told she did not need previously while on Xarelto and requested the HF team come see her.  Swelling continues to improve. PT walked pt 250 ft without O2 and patient fared well. She denies SOB or lightheadedness at this time. States she feels like lasix usually works for her, she just had trouble this week with fluid and SOB. Chest pressure worth with anxiety. Has been quiescent thus far. Feeling much better overall.   08/21/17 Echo 60-65%.RV ok. PA pressures could not be estimated.  Bed weights inaccurate.   Review of Systems: [y] = yes, [ ]  = no   General: Weight gain [y]; Weight loss [ ] ; Anorexia [ ] ; Fatigue [ ] ; Fever [ ] ; Chills [ ] ; Weakness [ ]   Cardiac: Chest pain/pressure [y]; Resting SOB [ ] ; Exertional SOB [y]; Orthopnea [ ] ; Pedal Edema [y]; Palpitations [ ] ; Syncope [ ] ; Presyncope [ ] ; Paroxysmal nocturnal dyspnea[  ]  Pulmonary: Cough [y]; Wheezing[ ] ; Hemoptysis[ ] ; Sputum [ ] ; Snoring [ ]   GI: Vomiting[ ] ; Dysphagia[ ] ; Melena[ ] ; Hematochezia [ ] ; Heartburn[ ] ; Abdominal pain [ ] ; Constipation [ ] ; Diarrhea [ ] ; BRBPR [ ]   GU: Hematuria[ ] ; Dysuria [ ] ; Nocturia[ ]   Vascular: Pain in legs with walking [ ] ; Pain in feet with lying flat [ ] ; Non-healing sores [ ] ; Stroke [ ] ; TIA [ ] ; Slurred speech [ ] ;  Neuro: Headaches[ ] ; Vertigo[ ] ; Seizures[ ] ; Paresthesias[ ] ;Blurred vision [ ] ; Diplopia [ ] ; Vision changes [ ]   Ortho/Skin: Arthritis [y]; Joint pain [y]; Muscle pain [ ] ; Joint swelling [ ] ; Back Pain [ ] ; Rash [ ]   Psych: Depression[ ] ; Anxiety[ ]   Heme: Bleeding problems [ ] ; Clotting disorders [ ] ; Anemia [ ]   Endocrine: Diabetes [ ] ; Thyroid dysfunction[ ]   Home Medications Prior to Admission medications   Medication Sig Start Date End Date Taking? Authorizing Provider  acetaminophen-codeine (TYLENOL #4) 300-60 MG tablet Take 1 tablet by mouth every 8 (eight) hours as needed for moderate pain. 06/30/17  Yes Ladell Pier, MD  albuterol (PROVENTIL HFA;VENTOLIN HFA) 108 (90 Base) MCG/ACT inhaler Inhale 1-2 puffs into the lungs every 6 (six) hours as needed for wheezing or shortness of breath. 04/10/17  Yes Ladell Pier, MD  amitriptyline (ELAVIL) 10 MG tablet Take 1 tablet (10 mg total)  by mouth at bedtime. 06/30/17  Yes Ladell Pier, MD  atorvastatin (LIPITOR) 20 MG tablet Take 1 tablet (20 mg total) by mouth daily at 6 PM. 01/27/17  Yes Langeland, Dawn T, MD  cyclobenzaprine (FLEXERIL) 5 MG tablet Take 1 tablet (5 mg total) by mouth 3 (three) times daily as needed. for muscle spams 07/30/17  Yes Ladell Pier, MD  docusate sodium (COLACE) 100 MG capsule Take 1 capsule (100 mg total) by mouth every 12 (twelve) hours. 10/24/16  Yes Margarita Mail, PA-C  ferrous sulfate (FERROUSUL) 325 (65 FE) MG tablet Take 1 tablet (325 mg total) by mouth daily with breakfast. 12/25/16  Yes  Langeland, Dawn T, MD  fluticasone (FLONASE) 50 MCG/ACT nasal spray Place 2 sprays into both nostrils daily. Patient taking differently: Place 2 sprays into both nostrils daily as needed for allergies.  02/04/17  Yes Langeland, Dawn T, MD  furosemide (LASIX) 40 MG tablet Take 1 tablet (40 mg total) by mouth daily. Take extra 40 mg tablet once in the afternoon AS NEEDED for weight gain 3 lbs or more. 01/27/17  Yes Langeland, Dawn T, MD  gabapentin (NEURONTIN) 300 MG capsule Take 2 capsules (600 mg total) by mouth 3 (three) times daily. 06/13/17  Yes Ladell Pier, MD  loratadine (CLARITIN) 10 MG tablet Take 1 tablet (10 mg total) by mouth daily. 03/03/17  Yes McClung, Dionne Bucy, PA-C  meclizine (ANTIVERT) 25 MG tablet TAKE ONE TABLET BY MOUTH THREE TIMES DAILY AS NEEDED FOR  DIZZINESS 06/27/17  Yes Arbutus Leas, NP  omeprazole (PRILOSEC) 20 MG capsule Take 1 capsule (20 mg total) by mouth daily. 06/13/17  Yes Ladell Pier, MD  potassium chloride (K-DUR,KLOR-CON) 10 MEQ tablet Take 20 mEq by mouth 2 (two) times daily.   Yes [provider]  Riociguat (ADEMPAS) 1 MG TABS Take 1 mg 3 (three) times daily by mouth. 07/29/17  Yes Bensimhon, Shaune Pascal, MD  rivaroxaban (XARELTO) 20 MG TABS tablet Take 1 tablet (20 mg total) by mouth daily with supper. 01/27/17  Yes Maren Reamer, MD    Past Medical History: Past Medical History:  Diagnosis Date  . Arthritis   . CHF exacerbation ([place]) 08/21/2017  . Depression   . Diverticulitis   . Hyperlipidemia   . Hypertension   . Obesity   . Pneumonia   . Pulmonary embolism [facility])     Past Surgical History: Past Surgical History:  Procedure Laterality Date  . ABDOMINAL HYSTERECTOMY    . BREAST REDUCTION SURGERY    . CHOLECYSTECTOMY    . EMBOLECTOMY N/A    pulmonary embolectomy  . REDUCTION MAMMAPLASTY Bilateral   . RIGHT HEART CATH N/A 10/28/2016   Procedure: Right Heart Cath;  Surgeon: Jolaine Artist, MD;  Location: [place] [facility];  Service: Cardiovascular;  Laterality: N/A;    Family History: Family History  Problem Relation Age of Onset  . Lung cancer Maternal Grandmother   . Dementia Mother   . Dementia Father   . Anxiety disorder Sister   . Bipolar disorder Sister   . Drug abuse Sister   . Sexual abuse Maternal Aunt   . Anxiety disorder Cousin   . Anxiety disorder Sister   . Bipolar disorder Sister   . Drug abuse Sister   . Breast cancer Neg Hx     Social History: Social History   Socioeconomic History  . Marital status: Married    Spouse name: None  . Number  of children: None  . Years of education: None  . Highest education level: None  Social Needs  . Financial resource strain: None  . Food insecurity - worry: None  . Food insecurity - inability: None  . Transportation needs - medical: None  . Transportation needs - non-medical: None  Occupational History  . None  Tobacco Use  . Smoking status: Never Smoker  . Smokeless tobacco: Never Used  Substance and Sexual Activity  . Alcohol use: No  . Drug use: No  . Sexual activity: Yes    Partners: Male    Birth control/protection: Surgical  Other Topics Concern  . None  Social History Narrative  . None    Allergies:  No Known Allergies  Objective:    Vital Signs:   Temp:  [97.9 F (36.6 C)-98.5 F (36.9 C)] 98.1 F (36.7 C) (12/14 1202) Pulse Rate:  [70-95] 95 (12/14 1200) Resp:  [12-17] 17 (12/14 1200) BP: (96-115)/(66-78) 112/77 (12/14 1200) SpO2:  [98 %-100 %] 100 % (12/14 1200) Weight:  [336 lb 6.8 oz (152.6 kg)] 336 lb 6.8 oz (152.6 kg) (12/14 0359) Last BM Date: 08/18/17  Weight change: Filed Weights   08/20/17 1815 08/22/17 0359  Weight: (!) 332 lb 14.3 oz (151 kg) (!) 336 lb 6.8 oz (152.6 kg)   Intake/Output:   Intake/Output Summary (Last 24 hours) at 08/22/2017 1415 Last data filed at 08/22/2017 1140 Gross per 24 hour  Intake 840 ml  Output 2800 ml  Net -1960 ml     Physical Exam    General:   Well appearing. Seated in chair. No resp difficulty HEENT: normal Neck: supple. JVP difficult to assess with body habitus - looks ok. Carotids 2+ bilat; no bruits. No lymphadenopathy or thyromegaly appreciated. Cor: PMI nondisplaced. Regular rate & rhythm. No rubs, gallops or murmurs. Lungs: Slightly diminished basilar sounds, otherwise clear. No wheeze. Abdomen: obese soft, nontender, nondistended. No hepatosplenomegaly. No bruits or masses. Good bowel sounds. Extremities: no cyanosis, clubbing, or rash. Trace to 1+ edema with UNNA boots in place.  Neuro: alert & orientedx3, cranial nerves grossly intact. moves all 4 extremities w/o difficulty. Affect pleasant  Telemetry   Sinus tach 100s, personally reviewed  EKG    NSR 65 bpm, personally reviewed  Labs   Basic Metabolic Panel: Recent Labs  Lab 08/20/17 1301 08/21/17 0528 08/22/17 0700  NA 139 140 139  K 3.9 3.5 4.1  CL 106 101 101  CO2 25 29 32  GLUCOSE 92 125* 111*  BUN 12 12 16   CREATININE 0.98 1.06* 1.03*  CALCIUM 9.1 9.0 9.2    Liver Function Tests: Recent Labs  Lab 08/20/17 1301 08/21/17 0528  AST 17 22  ALT 13* 14  ALKPHOS 78 71  BILITOT 0.6 0.2*  PROT 6.6 6.2*  ALBUMIN 3.3* 3.2*   No results for input(s): LIPASE, AMYLASE in the last 168 hours. No results for input(s): AMMONIA in the last 168 hours.  CBC: Recent Labs  Lab 08/20/17 1301 08/21/17 0426  WBC 5.7 6.1  HGB 11.4* 11.3*  HCT 34.8* 35.8*  MCV 64.7* 64.9*  PLT 274 306    Cardiac Enzymes: Recent Labs  Lab 08/20/17 1554 08/20/17 2154 08/21/17 0528  TROPONINI <0.03 <0.03 <0.03    BNP: BNP (last 3 results) Recent Labs    06/02/17 1856 08/20/17 1301  BNP 13.5 12.4    ProBNP (last 3 results) No results for input(s): PROBNP in the last 8760 hours.   CBG: Recent Labs  Lab 08/21/17 0840 08/22/17 0839  GLUCAP 107* 105*    Coagulation Studies: No results for input(s): LABPROT, INR in the last 72 hours.   Imaging     No results found.   Medications:     Current Medications: . amitriptyline  10 mg Oral QHS  . aspirin  324 mg Oral Once  . aspirin EC  81 mg Oral Daily  . atorvastatin  20 mg Oral q1800  . docusate sodium  100 mg Oral Q12H  . furosemide  80 mg Intravenous BID  . gabapentin  600 mg Oral TID  . potassium chloride  40 mEq Oral BID  . Riociguat  1 mg Oral TID  . rivaroxaban  20 mg Oral Q supper  . sodium chloride flush  3 mL Intravenous Q12H     Infusions:     Patient Profile   [name] [name] is a 56 y.o. 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   Admitted with SOB and chest discomfort in the setting of volume overloaded. Troponin negative. Responded well to IV lasix.   Assessment/Plan   1. 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 Would not use ASA.  - RHC in 2/18 with PA pressures in the 60s. PCW 9.  Spoke with patient. Will plan repeat RHC as OUTPATIENT next week.  - Echo 8/18 with no evidence RV strain or PAH (RVSP estimated 41mmHG)  -CT angio at [facility] on 05/15/16 showed "persistent but partially recanalized PE within right lower lobe pulmonary artery. No new PE identified." - VQ 10/17 positive for residual PE right lateral lung - Continue adempas to 1 tid and reassess symptoms (both dizziness and SOB).  - Will consider macitentan based on cath results next week.  - OK to give IV lasix x 1 more dose this evening. Pt feeling much better. Bed weights inaccurate.  - Tomorrow transition back to lasix 40 mg daily. Can use extra 40 mg in evening as needed - Start spiro 12.5 mg daily today.   2. CP - Atypical. Suspect in setting of volume overloaded.  - Now resolved. Troponin  negative.  - Continue to follow.   3. Chronic respiratory failure - Continue home oxygen.  - Encouraged weight loss. - Recently referred to [facility] home Pulmonary/Cardiac rehab program  4. Fibromyalgia/Rheumatoid Arthitis:  - Follows with Rheumatology. No change.   5. Morbid obesity with OHS:  - Again discussed importance of weight loss.   6. OSA - Reports compliance with CPAP.   7. Depression/anxiety - Major issue for her. She has been mostly stable this admission.   OK to give one more dose IV lasix with residual volume overloaded. Resume home lasix tomorrow with additional 40 mg as needed. Start spiro. Plan for RHC next week, and close follow up as outpatient.   RHC discussed with patient and scheduled. Instructions left in chart for RN to print off and review with pt.   Medication concerns reviewed with patient and pharmacy team. Barriers identified: Non at this time, although patient does have questionable compliance at times.   Length of Stay: 1  [name] [name]  08/22/2017, 2:15 PM  Advanced Heart Failure Team Pager [phone] (M-F; 7a - 4p)  Please  contact [facility] for night-coverage after hours (4p -7a ) and weekends on [email or web address]  Patient seen and examined with the above-signed Advanced Practice Provider and/or Housestaff. I personally reviewed laboratory data, imaging studies and relevant notes. I independently examined the patient and formulated the important aspects of the plan. I have edited the note to reflect any of my changes or salient points. I have personally discussed the plan with the patient and/or family.  Patient well known to me from [facility]. 56 y/o woman with morbid obesity and CTEPH s/p pulmonary throboembolectomy with residual PAH. Has been on adempas but has had trouble tolerating due to hypotension. Now on 1mg  tid (previously was on 3mg  tid).   Admitted with volume overload and decreased lasix responsiveness. Has  responded well to IV diuresis. Now euvolemic. Can switch back to lasix 40 daily (increase to lasix 80 daily as needed). Will add spiro. If not diuresing well we can switch to torsemide. Will plan outpatient RHC next week with possible addition of macitentan.  We will s/o. Please call with questions.   Glori Bickers, MD  4:26 PM

## 2017-08-22 NOTE — Progress Notes (Signed)
Internal Medicine Attending:   I saw and examined the patient. I reviewed the resident's note and I agree with the resident's findings and plan as documented in the resident's note. Patient does not feel she has had much urination feels she needs to go to the bathroom but cant. Wt recorded up, I&O net even for yesterday but appears to be having good output this morning.  We obtained bladder scan to rule out urinary retention.  Plan to continue diuresis as noted by Dr Shan Levans.

## 2017-08-22 NOTE — Progress Notes (Signed)
Pt having sudden onset of left lower abd pain, describes at sharp shooting that came on suddenly as she was sitting on BSC, pt in tears and hold LLQ - Md paged, patient returned to bed and pain resided.

## 2017-08-22 NOTE — Progress Notes (Signed)
I stopped in to check on [name] [name].  She seems concerned due to the fact that she is nauseated, has not lost weight (fluid) and is not urinating "like I think I should be".  She tells me that the physician is adjusting medications and she is hopeful that will help.  I offered encouragement and told her that I would be following her progress as well as updating the HF Community Paramedicine team regarding her discharge.

## 2017-08-23 DIAGNOSIS — R0789 Other chest pain: Secondary | ICD-10-CM

## 2017-08-23 DIAGNOSIS — Z79899 Other long term (current) drug therapy: Secondary | ICD-10-CM

## 2017-08-23 LAB — CBC
HCT: 37.9 % (ref 36.0–46.0)
Hemoglobin: 12.4 g/dL (ref 12.0–15.0)
MCH: 21.1 pg — ABNORMAL LOW (ref 26.0–34.0)
MCHC: 32.7 g/dL (ref 30.0–36.0)
MCV: 64.6 fL — ABNORMAL LOW (ref 78.0–100.0)
Platelets: 303 10*3/uL (ref 150–400)
RBC: 5.87 MIL/uL — ABNORMAL HIGH (ref 3.87–5.11)
RDW: 17.5 % — ABNORMAL HIGH (ref 11.5–15.5)
WBC: 6.2 10*3/uL (ref 4.0–10.5)

## 2017-08-23 LAB — BASIC METABOLIC PANEL
Anion gap: 8 (ref 5–15)
BUN: 18 mg/dL (ref 6–20)
CO2: 30 mmol/L (ref 22–32)
Calcium: 9.4 mg/dL (ref 8.9–10.3)
Chloride: 99 mmol/L — ABNORMAL LOW (ref 101–111)
Creatinine, Ser: 1.1 mg/dL — ABNORMAL HIGH (ref 0.44–1.00)
GFR calc Af Amer: >60 mL/min (ref >60)
GFR calc non Af Amer: 55 mL/min — ABNORMAL LOW (ref >60)
Glucose, Bld: 122 mg/dL — ABNORMAL HIGH (ref 65–99)
Potassium: 4.1 mmol/L (ref 3.5–5.1)
Sodium: 137 mmol/L (ref 135–145)

## 2017-08-23 LAB — URINALYSIS, ROUTINE W REFLEX MICROSCOPIC
Bilirubin Urine: NEGATIVE
Glucose, UA: NEGATIVE mg/dL
Hgb urine dipstick: NEGATIVE
Ketones, ur: NEGATIVE mg/dL
Nitrite: NEGATIVE
Protein, ur: NEGATIVE mg/dL
Specific Gravity, Urine: 1.01 (ref 1.005–1.030)
pH: 6 (ref 5.0–8.0)

## 2017-08-23 LAB — GLUCOSE, CAPILLARY: Glucose-Capillary: 104 mg/dL — ABNORMAL HIGH (ref 65–99)

## 2017-08-23 MED ORDER — HEPARIN SOD (PORK) LOCK FLUSH 100 UNIT/ML IV SOLN
500.0000 [IU] | INTRAVENOUS | Status: AC | PRN
  Administered 2017-08-23: 500 [IU]

## 2017-08-23 MED ORDER — SPIRONOLACTONE 25 MG PO TABS: 12.5000 mg | ORAL_TABLET | Freq: Every day | ORAL | 5 refills | Status: DC

## 2017-08-23 NOTE — Care Management Note (Signed)
Case Management Note  Patient Details  Name: [name] [name] MRN: [id number] Date of Birth: October 26, 1960  Subjective/Objective:         Pt presented for CP.  Pt has DME O2 through [facility] along with HH RN, PT, NA.  Pt states was supposed to have OT, but have not come.          Action/Plan: [facility] aware of resumption orders and pt d/c.  Jermaine with [facility] will ensure OT starts.  Pt will travel home by private vehicle.  Pt has not been on O2 today and states does not need O2 to travel home.  O2 sats 96% on RA at present.  RN states pt can ambulated on RA without problem.    Expected Discharge Date:  08/23/17               Expected Discharge Plan:  Home/Self Care  In-House Referral:     Discharge planning Services  CM Consult  Post Acute Care Choice:    Choice offered to:  Patient  DME Arranged:    DME Agency:     HH Arranged:  RN, PT, Nurse's Aide, OT [place] Agency:  [place]  Status of Service:  Completed, signed off  If discussed at [place] of Stay Meetings, dates discussed:    Additional Comments:  Arley Phenix, RN 08/23/2017, 1:10 PM

## 2017-08-23 NOTE — Progress Notes (Signed)
Discharge instructions given to patient, all questions answered at this time.  Prescription sent to patients pharmacy.  Pt. VSS with no s/s of distress noted.  Personal medication returned to patient.  Cab voucher given to patient.  Patient stable at discharge.

## 2017-08-23 NOTE — Progress Notes (Signed)
   Subjective:  Patient states breathing is stable; she has been urinating well. She continues to have left sided abdominal pain that is worse with bending and activity; she has had normal bowel movements.  Objective:  Vital signs in last 24 hours: Vitals:   08/22/17 2319 08/22/17 2331 08/23/17 0330 08/23/17 0735  BP:  103/70  92/66  Pulse: 95  83 79  Resp: 17  20 (!) 21  Temp: 98.4 F (36.9 C)  98.5 F (36.9 C) 98.1 F (36.7 C)  TempSrc: Oral  Axillary Oral  SpO2: 98%  96% 93%  Weight:      Height:       Constitutional: NAD HEENT: no JVD today CV: RRR, no murmurs, rubs or gallops, pulses intact Resp: CTAB, no increased work of breathing Abd: Obese, soft, ND, tenderness to palpation of RUQ - no masses, no rebound, no splenomegaly appreciated Ext: Una boots in place; LE edema significantly improved.  Assessment/Plan:  Active Problems:   OSA on CPAP   CTEPH (chronic thromboembolic pulmonary hypertension) ([place])   Essential hypertension   Atypical chest pain   Morbid obesity with BMI of 50.0-59.9, adult (HCC)   (HFpEF) heart failure with preserved ejection fraction (HCC)   CHF exacerbation (HCC)  HFpEF w/ Group 4 PAH s/p thromboembolectomy: Appears euvolemic today. Dr. Haroldine Laws evaluated yesterday and placed recs --lasix 40mg  PO daily (extra 40mg  if worsening dyspnea or edema), spironolactone 12.5mg  daily --RHC 12/18 with Dr. Haroldine Laws --continue Adempas - based on [place] will consider change in therapy  Noncardiac chest pain: Likely musculoskeletal based on symptoms. EKG and trops negative for ischemia.  OSA: --continue CPAP qhs  Dispo: Anticipated discharge today with [place] PT/OT/RN/Aid and O2.   Alphonzo Grieve, MD 08/23/2017, 12:04 PM Pager [phone]

## 2017-08-23 NOTE — Progress Notes (Signed)
CSW called to help with ride home- too weak to ride bus per RN and pt has been unable to find family to take home  CSW provided taxi voucher to RN and verified home address  CSW signing off  Jorge Ny, [place] Social Worker [phone]

## 2017-08-23 NOTE — Progress Notes (Signed)
  Date: 08/23/2017  Patient name: [name] [name]  Medical record number: [id number]  Date of birth: 25-Oct-1960   I have seen and evaluated this patient and I have discussed the plan of care with the house staff. Please see their note for complete details. I concur with their findings with the following additions/corrections: Her abdominal pain is likely musculoskeletal and does not require further workup. Reassurance was provided. Patient stable for discharge to home with follow-up on the 18th for right heart cath.   Bartholomew Crews, MD 08/23/2017, 3:09 PM

## 2017-08-23 NOTE — Discharge Summary (Signed)
Name: [name] [name] MRN: [id number] DOB: 01/02/1961 56 y.o. PCP: [name] Pier, MD  Date of Admission: 08/20/2017 12:03 PM Date of Discharge: 08/23/17 Attending Physician: [name] [name]: 1.  Active Problems:   OSA on CPAP   CTEPH (chronic thromboembolic pulmonary hypertension) ([place])   Essential hypertension   Atypical chest pain   Morbid obesity with BMI of 50.0-59.9, adult (HCC)   (HFpEF) heart failure with preserved ejection fraction (HCC)   CHF exacerbation ([place])   Discharge Medications: Allergies as of 08/23/2017   No Known Allergies     Medication List    TAKE these medications   acetaminophen-codeine 300-60 MG tablet Commonly known as:  TYLENOL #4 Take 1 tablet by mouth every 8 (eight) hours as needed for moderate pain.   albuterol 108 (90 Base) MCG/ACT inhaler Commonly known as:  PROVENTIL HFA;VENTOLIN HFA Inhale 1-2 puffs into the lungs every 6 (six) hours as needed for wheezing or shortness of breath.   amitriptyline 10 MG tablet Commonly known as:  ELAVIL Take 1 tablet (10 mg total) by mouth at bedtime.   atorvastatin 20 MG tablet Commonly known as:  LIPITOR Take 1 tablet (20 mg total) by mouth daily at 6 PM.   cyclobenzaprine 5 MG tablet Commonly known as:  FLEXERIL Take 1 tablet (5 mg total) by mouth 3 (three) times daily as needed. for muscle spams   docusate sodium 100 MG capsule Commonly known as:  COLACE Take 1 capsule (100 mg total) by mouth every 12 (twelve) hours.   ferrous sulfate 325 (65 FE) MG tablet Commonly known as:  FERROUSUL Take 1 tablet (325 mg total) by mouth daily with breakfast.   fluticasone 50 MCG/ACT nasal spray Commonly known as:  FLONASE Place 2 sprays into both nostrils daily. What changed:    when to take this  reasons to take this   furosemide 40 MG tablet Commonly known as:  LASIX Take 1 tablet (40 mg total) by mouth daily. Take extra 40 mg tablet once in the afternoon AS  NEEDED for weight gain 3 lbs or more.   gabapentin 300 MG capsule Commonly known as:  NEURONTIN Take 2 capsules (600 mg total) by mouth 3 (three) times daily.   loratadine 10 MG tablet Commonly known as:  CLARITIN Take 1 tablet (10 mg total) by mouth daily.   meclizine 25 MG tablet Commonly known as:  ANTIVERT TAKE ONE TABLET BY MOUTH THREE TIMES DAILY AS NEEDED FOR  DIZZINESS   omeprazole 20 MG capsule Commonly known as:  PRILOSEC Take 1 capsule (20 mg total) by mouth daily.   potassium chloride 10 MEQ tablet Commonly known as:  K-DUR,KLOR-CON Take 20 mEq by mouth 2 (two) times daily.   Riociguat 1 MG Tabs Commonly known as:  ADEMPAS Take 1 mg 3 (three) times daily by mouth.   rivaroxaban 20 MG Tabs tablet Commonly known as:  XARELTO Take 1 tablet (20 mg total) by mouth daily with supper.   spironolactone 25 MG tablet Commonly known as:  ALDACTONE Take 0.5 tablets (12.5 mg total) by mouth daily.       Disposition and follow-up:   Ms.[name] [name] was discharged from [facility] in Stable condition.  At the hospital follow up visit please address:  1.   HFpEF w/group 4 PAH -ensure pt tolerating new lasix dose and spironolactone with good urine output and volume status  2.  Labs / imaging needed at time of follow-up: bmp  3.  Pending labs/ test needing follow-up: none  Follow-up Appointments: Follow-up Information    Bensimhon, Shaune Pascal, MD Follow up on 09/01/2017.   Specialty:  Cardiology Why:  at 1020 am for post hospital follow up. The code for parking is 8002. Contact information: [place] [address] [place] Follow up on 08/26/2017.   Why:  Please report to admissions at 0730 for heart catheterization at nine thirty.  Nothing to eat or drink after midnight. No need to hold Xarelto.  Contact information: [place]  [id number] [place] [facility] Course by problem list: Active Problems:   OSA on CPAP   CTEPH (chronic thromboembolic pulmonary hypertension) ([place])   Essential hypertension   Atypical chest pain   Morbid obesity with BMI of 50.0-59.9, adult (HCC)   (HFpEF) heart failure with preserved ejection fraction ([place])   CHF exacerbation ([place])  Patient arrived to the emergency department with somewhat new chest pain symptoms in addition to her chronic right-sided chest pain.  Additionally, she was prior as this is she is complaining of increased shortness of breath over the last 3 days and a notice of 5 pound weight gain as well.  She feels that her Lasix 40 mg was not working and she had very little urine output last 3 days.  Even with the extra dose as she was instructed to take she had very little urine output.  Her chest pain was very atypical for angina, it was pleuritic nonradiating worse when she laid down flat.  She reported some left arm pain she attributes that to increased edema in that arm and hand which based on her symptoms tend to agree as well.  She also endorses a dry cough over the last few days.  When she arrived she had normal vital signs, she is chronically on 2 L nasal cannula this was continued and she remained 100% on this amount of oxygen throughout her stay.  Her ACS workup including serial EKGs and troponins were all negative.  Ultimately it seemed as if her chest pain was more musculoskeletal as it is worse with different positions she is laying on in the bed.  She was given 80 mg of IV Lasix In the emergency department that evening.  We continued her diuresis with 80 mg IV Lasix twice a day and wrapped her legs with [place] boots as well.  Her advanced heart failure doctor [name] [name] met with her and made an appointment for her to have a repeat right heart catheterization scheduled for Monday, 18 December.  Additionally, her home dose of Lasix was increased to 40 mg daily  and an extra dose if not diuresing well, and spironolactone 12.5 mg daily was added as well per recommendations of advanced heart failure.  Discharge Vitals:   BP 113/74 (BP Location: Right Wrist)   Pulse 86   Temp 98.5 F (36.9 C) (Oral)   Resp (!) 22   Ht 5' 3" (1.6 m)   Wt (!) 336 lb 6.8 oz (152.6 kg)   SpO2 99%   BMI 59.59 kg/m   Pertinent Labs, Studies, and Procedures:  CBC Latest Ref Rng & Units 08/23/2017 08/21/2017 08/20/2017  WBC 4.0 - 10.5 K/uL 6.2 6.1 5.7  Hemoglobin 12.0 - 15.0 g/dL 12.4 11.3(L) 11.4(L)  Hematocrit 36.0 - 46.0 % 37.9 35.8(L) 34.8(L)  Platelets 150 -  400 K/uL 303 306 274   BMP Latest Ref Rng & Units 08/23/2017 08/22/2017 08/21/2017  Glucose 65 - 99 mg/dL 122(H) 111(H) 125(H)  BUN 6 - 20 mg/dL _0 Creatinine 0.44 - 1.00 mg/dL 1.10(H) 1.03(H) 1.06(H)  Sodium 135 - 145 mmol/L 137 139 140  Potassium 3.5 - 5.1 mmol/L 4.1 4.1 3.5  Chloride 101 - 111 mmol/L 99(L) 101 101  CO2 22 - 32 mmol/L 30 32 29  Calcium 8.9 - 10.3 mg/dL 9.4 9.2 9.0   [facility] 08/21/17 Study Conclusions   - Left ventricle: The cavity size was normal. Systolic function was   normal. The estimated ejection fraction was in the range of 60%   to 65%. Wall motion was normal; there were no regional wall   motion abnormalities. Left ventricular diastolic function   parameters were normal. - Mitral valve: Calcified annulus. - Pulmonary arteries: Systolic pressure could not be accurately   estimated.     Discharge Instructions: Discharge Instructions    (HEART FAILURE PATIENTS) Call MD:  Anytime you have any of the following symptoms: 1) 3 pound weight gain in 24 hours or 5 pounds in 1 week 2) shortness of breath, with or without a dry hacking cough 3) swelling in the hands, feet or stomach 4) if you have to sleep on extra pillows at night in order to breathe.   Complete by:  As directed    Call MD for:  difficulty breathing, headache or visual disturbances   Complete by:  As  directed    Call MD for:  extreme fatigue   Complete by:  As directed    Call MD for:  hives   Complete by:  As directed    Call MD for:  persistant dizziness or light-headedness   Complete by:  As directed    Call MD for:  persistant nausea and vomiting   Complete by:  As directed    Call MD for:  redness, tenderness, or signs of infection (pain, swelling, redness, odor or green/yellow discharge around incision site)   Complete by:  As directed    Call MD for:  severe uncontrolled pain   Complete by:  As directed    Call MD for:  temperature >100.4   Complete by:  As directed    Diet - low sodium heart healthy   Complete by:  As directed    Discharge instructions   Complete by:  As directed    Continue taking Lasix 24m daily; if you feel short of breath or have increased swelling or weight gain, take an extra 476mof lasix.  Start taking spironolactone 12.57m50maily.  You will have a right heart catheterization on Tuesday the 18th.  Please follow up with Dr. BenHaroldine Laws Increase activity slowly   Complete by:  As directed       Signed: WinKatherine RoanD 08/24/2017, 9:24 AM

## 2017-08-25 DIAGNOSIS — G894 Chronic pain syndrome: Secondary | ICD-10-CM | POA: Diagnosis not present

## 2017-08-25 DIAGNOSIS — I10 Essential (primary) hypertension: Secondary | ICD-10-CM | POA: Diagnosis not present

## 2017-08-25 DIAGNOSIS — G4733 Obstructive sleep apnea (adult) (pediatric): Secondary | ICD-10-CM | POA: Diagnosis not present

## 2017-08-25 DIAGNOSIS — M069 Rheumatoid arthritis, unspecified: Secondary | ICD-10-CM | POA: Diagnosis not present

## 2017-08-25 DIAGNOSIS — K5793 Diverticulitis of intestine, part unspecified, without perforation or abscess with bleeding: Secondary | ICD-10-CM | POA: Diagnosis not present

## 2017-08-25 DIAGNOSIS — F329 Major depressive disorder, single episode, unspecified: Secondary | ICD-10-CM | POA: Diagnosis not present

## 2017-08-25 DIAGNOSIS — M1990 Unspecified osteoarthritis, unspecified site: Secondary | ICD-10-CM | POA: Diagnosis not present

## 2017-08-25 DIAGNOSIS — J9611 Chronic respiratory failure with hypoxia: Secondary | ICD-10-CM | POA: Diagnosis not present

## 2017-08-25 DIAGNOSIS — I2724 Chronic thromboembolic pulmonary hypertension: Secondary | ICD-10-CM | POA: Diagnosis not present

## 2017-08-26 ENCOUNTER — Encounter (HOSPITAL_COMMUNITY): Payer: Self-pay | Admitting: Internal Medicine

## 2017-08-26 ENCOUNTER — Telehealth (HOSPITAL_COMMUNITY): Payer: Self-pay | Admitting: *Deleted

## 2017-08-26 ENCOUNTER — Ambulatory Visit (HOSPITAL_COMMUNITY)
Admission: RE | Admit: 2017-08-26 | Discharge: 2017-08-26 | Disposition: A | Discharge status: Home / Self Care | Payer: Medicare HMO | Source: Ambulatory Visit | Attending: Internal Medicine | Admitting: Internal Medicine

## 2017-08-26 ENCOUNTER — Encounter (HOSPITAL_COMMUNITY)
Admission: RE | Disposition: A | Discharge status: Home / Self Care | Payer: Self-pay | Source: Ambulatory Visit | Attending: Internal Medicine

## 2017-08-26 DIAGNOSIS — Z9981 Dependence on supplemental oxygen: Secondary | ICD-10-CM | POA: Diagnosis not present

## 2017-08-26 DIAGNOSIS — I110 Hypertensive heart disease with heart failure: Secondary | ICD-10-CM | POA: Insufficient documentation

## 2017-08-26 DIAGNOSIS — G4733 Obstructive sleep apnea (adult) (pediatric): Secondary | ICD-10-CM | POA: Insufficient documentation

## 2017-08-26 DIAGNOSIS — M069 Rheumatoid arthritis, unspecified: Secondary | ICD-10-CM | POA: Insufficient documentation

## 2017-08-26 DIAGNOSIS — J9610 Chronic respiratory failure, unspecified whether with hypoxia or hypercapnia: Secondary | ICD-10-CM | POA: Insufficient documentation

## 2017-08-26 DIAGNOSIS — Z86711 Personal history of pulmonary embolism: Secondary | ICD-10-CM | POA: Diagnosis not present

## 2017-08-26 DIAGNOSIS — E662 Morbid (severe) obesity with alveolar hypoventilation: Secondary | ICD-10-CM | POA: Insufficient documentation

## 2017-08-26 DIAGNOSIS — E785 Hyperlipidemia, unspecified: Secondary | ICD-10-CM | POA: Insufficient documentation

## 2017-08-26 DIAGNOSIS — M797 Fibromyalgia: Secondary | ICD-10-CM | POA: Insufficient documentation

## 2017-08-26 DIAGNOSIS — I2721 Secondary pulmonary arterial hypertension: Secondary | ICD-10-CM

## 2017-08-26 DIAGNOSIS — Z79899 Other long term (current) drug therapy: Secondary | ICD-10-CM | POA: Diagnosis not present

## 2017-08-26 DIAGNOSIS — F419 Anxiety disorder, unspecified: Secondary | ICD-10-CM | POA: Insufficient documentation

## 2017-08-26 DIAGNOSIS — I272 Pulmonary hypertension, unspecified: Secondary | ICD-10-CM

## 2017-08-26 DIAGNOSIS — Z7901 Long term (current) use of anticoagulants: Secondary | ICD-10-CM | POA: Diagnosis not present

## 2017-08-26 DIAGNOSIS — Z6841 Body Mass Index (BMI) 40.0 and over, adult: Secondary | ICD-10-CM | POA: Diagnosis not present

## 2017-08-26 DIAGNOSIS — I5033 Acute on chronic diastolic (congestive) heart failure: Secondary | ICD-10-CM | POA: Insufficient documentation

## 2017-08-26 HISTORY — PX: RIGHT HEART CATH: CATH118263

## 2017-08-26 HISTORY — PX: ULTRASOUND GUIDANCE FOR VASCULAR ACCESS: SHX6516

## 2017-08-26 LAB — POCT I-STAT 3, VENOUS BLOOD GAS (G3P V)
Acid-base deficit: 2 mmol/L (ref 0.0–2.0)
Acid-base deficit: 3 mmol/L — ABNORMAL HIGH (ref 0.0–2.0)
Acid-base deficit: 3 mmol/L — ABNORMAL HIGH (ref 0.0–2.0)
Bicarbonate: 23.3 mmol/L (ref 20.0–28.0)
Bicarbonate: 23.8 mmol/L (ref 20.0–28.0)
Bicarbonate: 23.8 mmol/L (ref 20.0–28.0)
O2 Saturation: 67 %
O2 Saturation: 70 %
O2 Saturation: 70 %
TCO2: 25 mmol/L (ref 22–32)
TCO2: 25 mmol/L (ref 22–32)
TCO2: 25 mmol/L (ref 22–32)
pCO2, Ven: 46.2 mmHg (ref 44.0–60.0)
pCO2, Ven: 46.3 mmHg (ref 44.0–60.0)
pCO2, Ven: 46.3 mmHg (ref 44.0–60.0)
pH, Ven: 7.311 (ref 7.250–7.430)
pH, Ven: 7.318 (ref 7.250–7.430)
pH, Ven: 7.32 (ref 7.250–7.430)
pO2, Ven: 38 mmHg (ref 32.0–45.0)
pO2, Ven: 40 mmHg (ref 32.0–45.0)
pO2, Ven: 40 mmHg (ref 32.0–45.0)

## 2017-08-26 SURGERY — RIGHT HEART CATH
Anesthesia: LOCAL

## 2017-08-26 MED ORDER — SODIUM CHLORIDE 0.9% FLUSH: 3.0000 mL | Freq: Two times a day (BID) | INTRAVENOUS | Status: DC

## 2017-08-26 MED ORDER — FENTANYL CITRATE (PF) 100 MCG/2ML IJ SOLN
INTRAMUSCULAR | Status: AC
  Filled 2017-08-26: qty 2

## 2017-08-26 MED ORDER — ASPIRIN 81 MG PO CHEW
81.0000 mg | CHEWABLE_TABLET | ORAL | Status: AC
  Administered 2017-08-26: 81 mg via ORAL

## 2017-08-26 MED ORDER — OXYCODONE-ACETAMINOPHEN 5-325 MG PO TABS
1.0000 | ORAL_TABLET | Freq: Once | ORAL | Status: AC
  Administered 2017-08-26: 1 via ORAL

## 2017-08-26 MED ORDER — ASPIRIN 81 MG PO CHEW
CHEWABLE_TABLET | ORAL | Status: AC
  Administered 2017-08-26: 81 mg via ORAL
  Filled 2017-08-26: qty 1

## 2017-08-26 MED ORDER — OXYCODONE-ACETAMINOPHEN 5-325 MG PO TABS
ORAL_TABLET | ORAL | Status: AC
  Filled 2017-08-26: qty 1

## 2017-08-26 MED ORDER — SODIUM CHLORIDE 0.9 % IV SOLN: 250.0000 mL | INTRAVENOUS | Status: DC | PRN

## 2017-08-26 MED ORDER — HEPARIN SOD (PORK) LOCK FLUSH 100 UNIT/ML IV SOLN
INTRAVENOUS | Status: AC
  Administered 2017-08-26: 500 [IU]
  Filled 2017-08-26: qty 5

## 2017-08-26 MED ORDER — SODIUM CHLORIDE 0.9% FLUSH: 3.0000 mL | INTRAVENOUS | Status: DC | PRN

## 2017-08-26 MED ORDER — HEPARIN (PORCINE) IN NACL 2-0.9 UNIT/ML-% IJ SOLN
INTRAMUSCULAR | Status: AC | PRN
  Administered 2017-08-26: 500 mL

## 2017-08-26 MED ORDER — FENTANYL CITRATE (PF) 100 MCG/2ML IJ SOLN
INTRAMUSCULAR | Status: DC | PRN
  Administered 2017-08-26 (×2): 25 ug via INTRAVENOUS

## 2017-08-26 MED ORDER — SODIUM CHLORIDE 0.9 % IV SOLN
INTRAVENOUS | Status: DC
  Administered 2017-08-26: 09:00:00 via INTRAVENOUS

## 2017-08-26 MED ORDER — HEPARIN (PORCINE) IN NACL 2-0.9 UNIT/ML-% IJ SOLN
INTRAMUSCULAR | Status: AC
  Filled 2017-08-26: qty 500

## 2017-08-26 MED ORDER — LIDOCAINE HCL (PF) 1 % IJ SOLN
INTRAMUSCULAR | Status: AC
  Filled 2017-08-26: qty 30

## 2017-08-26 MED ORDER — LIDOCAINE HCL (PF) 1 % IJ SOLN
INTRAMUSCULAR | Status: DC | PRN
  Administered 2017-08-26: 2 mL

## 2017-08-26 MED ORDER — MIDAZOLAM HCL 2 MG/2ML IJ SOLN
INTRAMUSCULAR | Status: DC | PRN
  Administered 2017-08-26 (×2): 1 mg via INTRAVENOUS

## 2017-08-26 MED ORDER — MIDAZOLAM HCL 2 MG/2ML IJ SOLN
INTRAMUSCULAR | Status: AC
  Filled 2017-08-26: qty 2

## 2017-08-26 SURGICAL SUPPLY — 11 items
CATH BALLN WEDGE 5F 110CM (CATHETERS) ×3 IMPLANT
COVER PRB 48X5XTLSCP FOLD TPE (BAG) ×2 IMPLANT
COVER PROBE 5X48 (BAG) ×1
GUIDEWIRE .025 260CM (WIRE) ×3 IMPLANT
HOVERMATT SINGLE USE (MISCELLANEOUS) ×3 IMPLANT
KIT MICROINTRODUCER STIFF 5F (SHEATH) ×3 IMPLANT
PACK CARDIAC CATHETERIZATION (CUSTOM PROCEDURE TRAY) ×3 IMPLANT
PROTECTION STATION PRESSURIZED (MISCELLANEOUS) ×3
SHEATH GLIDE SLENDER 4/5FR (SHEATH) ×3 IMPLANT
STATION PROTECTION PRESSURIZED (MISCELLANEOUS) ×2 IMPLANT
TRANSDUCER W/STOPCOCK (MISCELLANEOUS) ×3 IMPLANT

## 2017-08-26 NOTE — Discharge Instructions (Signed)

## 2017-08-26 NOTE — Interval H&P Note (Signed)
History and Physical Interval Note:  08/26/2017 9:43 AM  [name] [name]  has presented today for surgery, with the diagnosis of pulmonary hypertension  The various methods of treatment have been discussed with the patient and family. After consideration of risks, benefits and other options for treatment, the patient has consented to  Procedure(s): RIGHT HEART CATH (N/A) as a surgical intervention .  The patient's history has been reviewed, patient examined, no change in status, stable for surgery.  I have reviewed the patient's chart and labs.  Questions were answered to the patient's satisfaction.     Daniel Bensimhon

## 2017-08-26 NOTE — Telephone Encounter (Signed)
Verbal order given for home health physical therapy.

## 2017-08-27 ENCOUNTER — Telehealth: Payer: Self-pay | Admitting: Internal Medicine

## 2017-08-27 ENCOUNTER — Other Ambulatory Visit (HOSPITAL_COMMUNITY): Payer: Self-pay

## 2017-08-27 DIAGNOSIS — I10 Essential (primary) hypertension: Secondary | ICD-10-CM | POA: Diagnosis not present

## 2017-08-27 DIAGNOSIS — G894 Chronic pain syndrome: Secondary | ICD-10-CM | POA: Diagnosis not present

## 2017-08-27 DIAGNOSIS — I2724 Chronic thromboembolic pulmonary hypertension: Secondary | ICD-10-CM | POA: Diagnosis not present

## 2017-08-27 DIAGNOSIS — M5442 Lumbago with sciatica, left side: Secondary | ICD-10-CM

## 2017-08-27 DIAGNOSIS — K5793 Diverticulitis of intestine, part unspecified, without perforation or abscess with bleeding: Secondary | ICD-10-CM | POA: Diagnosis not present

## 2017-08-27 DIAGNOSIS — M1990 Unspecified osteoarthritis, unspecified site: Secondary | ICD-10-CM | POA: Diagnosis not present

## 2017-08-27 DIAGNOSIS — F329 Major depressive disorder, single episode, unspecified: Secondary | ICD-10-CM | POA: Diagnosis not present

## 2017-08-27 DIAGNOSIS — J9611 Chronic respiratory failure with hypoxia: Secondary | ICD-10-CM | POA: Diagnosis not present

## 2017-08-27 DIAGNOSIS — M069 Rheumatoid arthritis, unspecified: Secondary | ICD-10-CM | POA: Diagnosis not present

## 2017-08-27 DIAGNOSIS — G4733 Obstructive sleep apnea (adult) (pediatric): Secondary | ICD-10-CM | POA: Diagnosis not present

## 2017-08-27 MED ORDER — CYCLOBENZAPRINE HCL 5 MG PO TABS: 5.0000 mg | ORAL_TABLET | Freq: Two times a day (BID) | ORAL | 1 refills | Status: DC | PRN

## 2017-08-27 NOTE — Progress Notes (Signed)
Paramedicine Encounter    Patient ID: [name] [name], female    DOB: April 14, 1961, 56 y.o.   MRN: [id number]   Patient Care Team: Ladell Pier, MD as PCP - General (Internal Medicine) Loletta Specter, RN as [place] Management  Patient Active Problem List   Diagnosis Date Noted  . CHF exacerbation ([place]) 08/21/2017  . (HFpEF) heart failure with preserved ejection fraction ([place]) 08/20/2017  . Lumbar radiculopathy 08/08/2017  . Morbid obesity with BMI of 50.0-59.9, adult ([place]) 08/08/2017  . Hoarseness 08/08/2017  . Chronic cough 04/10/2017  . Rheumatoid arthritis involving multiple sites ([place]) 04/10/2017  . Anxiety and depression 04/10/2017  . Adjustment disorder with depressed mood 12/11/2016  . Morbid obesity ([place]) 08/14/2016  . Stroke ([place]) 06/27/2016  . Right sided weakness 06/27/2016  . Essential hypertension 06/27/2016  . Atypical chest pain 06/27/2016  . Difficult airway 03/29/2016  . CTEPH (chronic thromboembolic pulmonary hypertension) ([place]) 03/14/2016  . OSA on CPAP 02/20/2016  . Chronic pain syndrome 01/31/2016  . Pulmonary embolus ([place]) 11/15/2015  . Vertigo 11/15/2015  . Depression 11/15/2015    Current Outpatient Medications:  .  acetaminophen-codeine (TYLENOL #4) 300-60 MG tablet, Take 1 tablet by mouth every 8 (eight) hours as needed for moderate pain., Disp: 70 tablet, Rfl: 1 .  albuterol (PROVENTIL HFA;VENTOLIN HFA) 108 (90 Base) MCG/ACT inhaler, Inhale 1-2 puffs into the lungs every 6 (six) hours as needed for wheezing or shortness of breath., Disp: 6.7 g, Rfl: 5 .  amitriptyline (ELAVIL) 10 MG tablet, Take 1 tablet (10 mg total) by mouth at bedtime., Disp: 30 tablet, Rfl: 2 .  atorvastatin (LIPITOR) 20 MG tablet, Take 1 tablet (20 mg total) by mouth daily at 6 PM., Disp: 90 tablet, Rfl: 2 .  cyclobenzaprine (FLEXERIL) 5 MG tablet, Take 1 tablet (5 mg total) by mouth 3 (three) times daily as needed. for muscle spams, Disp:  30 tablet, Rfl: 0 .  docusate sodium (COLACE) 100 MG capsule, Take 1 capsule (100 mg total) by mouth every 12 (twelve) hours., Disp: 30 capsule, Rfl: 0 .  ferrous sulfate (FERROUSUL) 325 (65 FE) MG tablet, Take 1 tablet (325 mg total) by mouth daily with breakfast., Disp: 90 tablet, Rfl: 3 .  fluticasone (FLONASE) 50 MCG/ACT nasal spray, Place 2 sprays into both nostrils daily. (Patient taking differently: Place 2 sprays into both nostrils daily as needed for allergies. ), Disp: 16 g, Rfl: 6 .  furosemide (LASIX) 40 MG tablet, Take 1 tablet (40 mg total) by mouth daily. Take extra 40 mg tablet once in the afternoon AS NEEDED for weight gain 3 lbs or more., Disp: 60 tablet, Rfl: 3 .  gabapentin (NEURONTIN) 300 MG capsule, Take 2 capsules (600 mg total) by mouth 3 (three) times daily., Disp: 180 capsule, Rfl: 3 .  loratadine (CLARITIN) 10 MG tablet, Take 1 tablet (10 mg total) by mouth daily., Disp: 30 tablet, Rfl: 11 .  meclizine (ANTIVERT) 25 MG tablet, TAKE ONE TABLET BY MOUTH THREE TIMES DAILY AS NEEDED FOR  DIZZINESS, Disp: 60 tablet, Rfl: 0 .  omeprazole (PRILOSEC) 20 MG capsule, Take 1 capsule (20 mg total) by mouth daily., Disp: 30 capsule, Rfl: 3 .  potassium chloride (K-DUR,KLOR-CON) 10 MEQ tablet, Take 20 mEq by mouth 2 (two) times daily., Disp: , Rfl:  .  Riociguat (ADEMPAS) 1 MG TABS, Take 1 mg 3 (three) times daily by mouth., Disp: 90 tablet, Rfl: 5 .  rivaroxaban (XARELTO) 20 MG TABS tablet,  Take 1 tablet (20 mg total) by mouth daily with supper., Disp: 90 tablet, Rfl: 3 .  spironolactone (ALDACTONE) 25 MG tablet, Take 0.5 tablets (12.5 mg total) by mouth daily., Disp: 30 tablet, Rfl: 5  Current Facility-Administered Medications:  .  heparin flush 10 UNIT/ML injection 5,000 Units, 500 mL, Intracatheter, Q30 days, Ladell Pier, MD No Known Allergies    Social History   Socioeconomic History  . Marital status: Married    Spouse name: Not on file  . Number of children: Not on  file  . Years of education: Not on file  . Highest education level: Not on file  Social Needs  . Financial resource strain: Not on file  . Food insecurity - worry: Not on file  . Food insecurity - inability: Not on file  . Transportation needs - medical: Not on file  . Transportation needs - non-medical: Not on file  Occupational History  . Not on file  Tobacco Use  . Smoking status: Never Smoker  . Smokeless tobacco: Never Used  Substance and Sexual Activity  . Alcohol use: No  . Drug use: No  . Sexual activity: Yes    Partners: Male    Birth control/protection: Surgical  Other Topics Concern  . Not on file  Social History Narrative  . Not on file    Physical Exam      Future Appointments  Date Time Provider [place]  09/01/2017 10:20 AM Bensimhon, Shaune Pascal, MD [facility] None  09/22/2017  4:00 PM [name], MD [facility] None  09/25/2017 10:00 AM [facility] ROOM [facility] None  10/27/2017  8:00 AM [facility] ROOM [facility] None  11/07/2017 11:15 AM Ladell Pier, MD [facility] None    There were no vitals taken for this visit.  [name] was seen at home today and reports feeling tired after having a heart cath yesterday and being in the hospital over the weekend. She is SOB with exertion believes that is a result of having so much going on. She said she is supposed to be getting OT per the [facility]. She reported that the nurse who came out earlier today and checked her vital signs so she did not want it to be taken again. Her medications were verified and her pillbox was refilled.   Time spent with patient: 20 minutes  [name], EMT 08/27/17  ACTION: Home visit completed Next visit planned for 1 week

## 2017-08-27 NOTE — Telephone Encounter (Signed)
RF on Flexeril sent to [facility] on [address].

## 2017-08-27 NOTE — Telephone Encounter (Signed)
Pt called to request a refill on -cyclobenzaprine (FLEXERIL) 5 MG tablet  Please follow up If approved please send it to [facility] on [address]

## 2017-08-27 NOTE — Addendum Note (Signed)
Addended by: Karle Plumber B on: 08/27/2017 07:42 PM   Modules accepted: Orders

## 2017-08-28 ENCOUNTER — Other Ambulatory Visit: Payer: Self-pay

## 2017-08-28 ENCOUNTER — Other Ambulatory Visit: Payer: Self-pay | Admitting: Student

## 2017-08-28 DIAGNOSIS — I2724 Chronic thromboembolic pulmonary hypertension: Secondary | ICD-10-CM | POA: Diagnosis not present

## 2017-08-28 DIAGNOSIS — G4733 Obstructive sleep apnea (adult) (pediatric): Secondary | ICD-10-CM | POA: Diagnosis not present

## 2017-08-28 DIAGNOSIS — F329 Major depressive disorder, single episode, unspecified: Secondary | ICD-10-CM | POA: Diagnosis not present

## 2017-08-28 DIAGNOSIS — K5793 Diverticulitis of intestine, part unspecified, without perforation or abscess with bleeding: Secondary | ICD-10-CM | POA: Diagnosis not present

## 2017-08-28 DIAGNOSIS — G894 Chronic pain syndrome: Secondary | ICD-10-CM | POA: Diagnosis not present

## 2017-08-28 DIAGNOSIS — I10 Essential (primary) hypertension: Secondary | ICD-10-CM | POA: Diagnosis not present

## 2017-08-28 DIAGNOSIS — M1990 Unspecified osteoarthritis, unspecified site: Secondary | ICD-10-CM | POA: Diagnosis not present

## 2017-08-28 DIAGNOSIS — M069 Rheumatoid arthritis, unspecified: Secondary | ICD-10-CM | POA: Diagnosis not present

## 2017-08-28 DIAGNOSIS — J9611 Chronic respiratory failure with hypoxia: Secondary | ICD-10-CM | POA: Diagnosis not present

## 2017-08-28 NOTE — Patient Outreach (Signed)
[facility]) Care Management  08/28/2017  Kandis Henry [facility] 08/16/1961 [id number]    Referral received 08/27/2017-RNCM transition:  RNCM called to introduce self and arrange home visit. Client agrees to Care management services with [facility].  Heart cath completed on 12/18. Client reports she is feeling better today. Denies any issues with heart cath today. Discharge instructions reviewed.  Social work referral for assistance help with grandson 31year old. Her husband is having knee replacement surgery on Janyary 9th and she would like to know what resources are available to help her with this. Also would like to know what resources are available to help her with Christmas for her grandson.  Plan: home visit scheduled to assess care management needs.  Thea Silversmith, RN, MSN, [place] Coordinator Cell: [phone]

## 2017-09-01 ENCOUNTER — Ambulatory Visit (HOSPITAL_COMMUNITY)
Admission: RE | Admit: 2017-09-01 | Discharge: 2017-09-01 | Disposition: A | Discharge status: Home / Self Care | Payer: Medicare HMO | Source: Ambulatory Visit | Attending: Internal Medicine | Admitting: Internal Medicine

## 2017-09-01 ENCOUNTER — Encounter (HOSPITAL_COMMUNITY): Payer: Self-pay | Admitting: Internal Medicine

## 2017-09-01 ENCOUNTER — Encounter (HOSPITAL_COMMUNITY): Payer: Medicare HMO

## 2017-09-01 ENCOUNTER — Other Ambulatory Visit: Payer: Self-pay

## 2017-09-01 VITALS — BP 120/66 | HR 88 | Wt 336.2 lb

## 2017-09-01 DIAGNOSIS — M797 Fibromyalgia: Secondary | ICD-10-CM | POA: Diagnosis not present

## 2017-09-01 DIAGNOSIS — Z801 Family history of malignant neoplasm of trachea, bronchus and lung: Secondary | ICD-10-CM | POA: Insufficient documentation

## 2017-09-01 DIAGNOSIS — Z813 Family history of other psychoactive substance abuse and dependence: Secondary | ICD-10-CM | POA: Insufficient documentation

## 2017-09-01 DIAGNOSIS — Z79899 Other long term (current) drug therapy: Secondary | ICD-10-CM | POA: Insufficient documentation

## 2017-09-01 DIAGNOSIS — Z8489 Family history of other specified conditions: Secondary | ICD-10-CM | POA: Insufficient documentation

## 2017-09-01 DIAGNOSIS — Z9889 Other specified postprocedural states: Secondary | ICD-10-CM | POA: Diagnosis not present

## 2017-09-01 DIAGNOSIS — I509 Heart failure, unspecified: Secondary | ICD-10-CM | POA: Insufficient documentation

## 2017-09-01 DIAGNOSIS — G8929 Other chronic pain: Secondary | ICD-10-CM | POA: Diagnosis not present

## 2017-09-01 DIAGNOSIS — M069 Rheumatoid arthritis, unspecified: Secondary | ICD-10-CM | POA: Diagnosis not present

## 2017-09-01 DIAGNOSIS — I110 Hypertensive heart disease with heart failure: Secondary | ICD-10-CM | POA: Diagnosis not present

## 2017-09-01 DIAGNOSIS — Z90710 Acquired absence of both cervix and uterus: Secondary | ICD-10-CM | POA: Insufficient documentation

## 2017-09-01 DIAGNOSIS — R42 Dizziness and giddiness: Secondary | ICD-10-CM | POA: Insufficient documentation

## 2017-09-01 DIAGNOSIS — Z6841 Body Mass Index (BMI) 40.0 and over, adult: Secondary | ICD-10-CM | POA: Insufficient documentation

## 2017-09-01 DIAGNOSIS — Z86711 Personal history of pulmonary embolism: Secondary | ICD-10-CM | POA: Insufficient documentation

## 2017-09-01 DIAGNOSIS — I2724 Chronic thromboembolic pulmonary hypertension: Secondary | ICD-10-CM

## 2017-09-01 DIAGNOSIS — M79622 Pain in left upper arm: Secondary | ICD-10-CM

## 2017-09-01 DIAGNOSIS — G4733 Obstructive sleep apnea (adult) (pediatric): Secondary | ICD-10-CM | POA: Insufficient documentation

## 2017-09-01 DIAGNOSIS — Z7901 Long term (current) use of anticoagulants: Secondary | ICD-10-CM | POA: Diagnosis not present

## 2017-09-01 DIAGNOSIS — F419 Anxiety disorder, unspecified: Secondary | ICD-10-CM | POA: Diagnosis not present

## 2017-09-01 DIAGNOSIS — Z9049 Acquired absence of other specified parts of digestive tract: Secondary | ICD-10-CM | POA: Insufficient documentation

## 2017-09-01 DIAGNOSIS — F329 Major depressive disorder, single episode, unspecified: Secondary | ICD-10-CM | POA: Diagnosis not present

## 2017-09-01 DIAGNOSIS — I5030 Unspecified diastolic (congestive) heart failure: Secondary | ICD-10-CM

## 2017-09-01 DIAGNOSIS — I959 Hypotension, unspecified: Secondary | ICD-10-CM | POA: Diagnosis not present

## 2017-09-01 DIAGNOSIS — E785 Hyperlipidemia, unspecified: Secondary | ICD-10-CM | POA: Diagnosis not present

## 2017-09-01 DIAGNOSIS — M79602 Pain in left arm: Secondary | ICD-10-CM | POA: Insufficient documentation

## 2017-09-01 DIAGNOSIS — R079 Chest pain, unspecified: Secondary | ICD-10-CM | POA: Diagnosis not present

## 2017-09-01 DIAGNOSIS — Z820 Family history of epilepsy and other diseases of the nervous system: Secondary | ICD-10-CM | POA: Insufficient documentation

## 2017-09-01 DIAGNOSIS — R002 Palpitations: Secondary | ICD-10-CM | POA: Diagnosis not present

## 2017-09-01 DIAGNOSIS — Z818 Family history of other mental and behavioral disorders: Secondary | ICD-10-CM | POA: Insufficient documentation

## 2017-09-01 DIAGNOSIS — I272 Pulmonary hypertension, unspecified: Secondary | ICD-10-CM | POA: Diagnosis not present

## 2017-09-01 MED ORDER — RIOCIGUAT 1 MG PO TABS: 1.0000 mg | ORAL_TABLET | Freq: Three times a day (TID) | ORAL | 5 refills | Status: DC

## 2017-09-01 NOTE — Patient Instructions (Signed)
Your physician has requested that you have a lower or upper extremity venous duplex. This test is an ultrasound of the veins in the legs or arms. It looks at venous blood flow that carries blood from the heart to the legs or arms. Allow one hour for a Lower Venous exam. Allow thirty minutes for an Upper Venous exam. There are no restrictions or special instructions.  Labs in January at the [facility], just let them know the order is in the computer system  Your physician recommends that you schedule a follow-up appointment in: 8 weeks with Dr Haroldine Laws

## 2017-09-01 NOTE — Progress Notes (Signed)
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   Patient was in her [name] until 08/2015 when she started getting symptoms of dyspnea on exertion, dizziness, bilateral LE swelling, and constant R>L non-specific chest pain. These symptoms progressively worsened over several days prompting her to go to [facility] in her then-hometown of [name] in 09/2015 where she was diagnosed with PE and started on anticoagulation. She was very briefly on warfarin but quickly switched to Xarelto. She was referred to [facility] in 12/2015. chocardiogram (12-2015) showed normal LVEF 55-60%, normal LA, normal RV size, RVSP estimated as 70 mm Hg. She further was referred to [facility] and saw [name] [name] on 03/14/2016 where patient had several studies including V/Q scan which found high prob for PE; studies c/w CTEPH. It was felt that her chronic chest pain was caused by chronic PE. RHC/LHC/Pulm Angiogram 03/2016 confirmed diagnosis of CTEPH. She underwent pulmonary thromboembolectomy 03/31/2016 at [facility] with IVC filter placement. Pre-op cath with no CAD.   Seen by [name] [name] in 04/2016 for follow up. He felt like her heart size was decreasing. Plan was for lifelong [place] and ECHO and V/Q in 4 months. [name] [name] has since left the [place] practice.  CT angio at [facility] on 05/15/16 showed " persistent but partially recanalized PE within right lower lobe pulmonary artery. No new PE identified. "  She was admitted to [name] in 06/2016 for continued chest pain and parasthesias. V/Q scan on 06/27/16 showed prominent ventilation perfusion mismatch in right c/w chronic PE.  Head CT negative. She was continued on Xarelto and told to follow up with [facility]. Patient doesn't want to have to travel to [facility] care so [place] care here in [place].   Admitted earlier this month with SOB and chest pain. Diuresed with IV lasix and transitioned to lasix 40 mg daily + 12.5 mg spiro. Seen by HF team and set up for [place] as an outpatient. Discharge 336 pounds.   Today she returns for post cath follow up. Having ongoing presyncope sitting and standing. Complaining of pain in the LUE above the site from [place] last week. SOB with exertion. Having lower extremity edema. Taking all medications.  Has smart port due to difficulty with blood draws.   08/21/17 Echo 60-65%.RV ok. PA pressures could not be estimated. 04/2017: EF 55-60%  RV normal RVSP 57mmHG  RHC 08/26/2017 RA = 6 RV = 50/7 PA = 48/13 (30) PCW = 6 Fick cardiac output/index = 7.0/2.9 PVR = 3.1 WU Ao sat = 97% PA sat = 67%, 70% SVC sat = 70% Assessment: Mild PAH (on [place]emp[place] 1mg  tid) with high cardiac output. No evidence of intracardiac shunting.   10/2016 RHC RA = 8 RV = 64/13 PA = 65/19 (35) PCW = 9 Fick cardiac output/index = 7.5/3.1 PVR = 3.5 WU Ao sat = 96% PA sat = 68%,70% SVC sat = 71% Assessment: 1. Mild residual PAH in the setting of CTEPH s/p pulmonary thrombolectomy 2. Normal cardiac output 3. Normal left-sided filling pressures  Past Medical History:  Diagnosis Date  . Arthritis   .  CHF exacerbation ([name]) 08/21/2017  . Depression   . Diverticulitis   . Hyperlipidemia   . Hypertension   . Obesity   . Pneumonia   . Pulmonary embolism [name])     Past Surgical History:  Procedure Laterality Date  . ABDOMINAL HYSTERECTOMY    . BREAST REDUCTION SURGERY    . CHOLECYSTECTOMY    . EMBOLECTOMY N/A    pulmonary embolectomy  . REDUCTION MAMMAPLASTY Bilateral   . RIGHT HEART CATH N/A 10/28/2016   Procedure: Right Heart Cath;  Surgeon: [name] Artist, MD;  Location: [place]  CV LAB;  Service: Cardiovascular;  Laterality: N/A;  . RIGHT HEART CATH N/A 08/26/2017   Procedure: RIGHT HEART CATH;  Surgeon: [name] Artist, MD;  Location: [place] CV LAB;  Service: Cardiovascular;  Laterality: N/A;  . ULTRASOUND GUIDANCE FOR VASCULAR ACCESS  08/26/2017   Procedure: Ultrasound Guidance For Vascular Access;  Surgeon: [name] Artist, MD;  Location: [place] CV LAB;  Service: Cardiovascular;;    Current Medications:  Current Outpatient Medications on File Prior to Encounter  Medication Sig Dispense Refill  . acetaminophen-codeine (TYLENOL #4) 300-60 MG tablet Take 1 tablet by mouth every 8 (eight) hours as needed for moderate pain. 70 tablet 1  . albuterol (PROVENTIL HFA;VENTOLIN HFA) 108 (90 Base) MCG/ACT inhaler Inhale 1-2 puffs into the lungs every 6 (six) hours as needed for wheezing or shortness of breath. 6.7 g 5  . amitriptyline (ELAVIL) 10 MG tablet Take 1 tablet (10 mg total) by mouth at bedtime. 30 tablet 2  . atorvastatin (LIPITOR) 20 MG tablet Take 1 tablet (20 mg total) by mouth daily at 6 PM. 90 tablet 2  . cyclobenzaprine (FLEXERIL) 5 MG tablet Take 1 tablet (5 mg total) by mouth 2 (two) times daily as needed. for muscle spams 40 tablet 1  . docusate sodium (COLACE) 100 MG capsule Take 1 capsule (100 mg total) by mouth every 12 (twelve) hours. 30 capsule 0  . ferrous sulfate (FERROUSUL) 325 (65 FE) MG tablet Take 1 tablet (325 mg total) by mouth daily with breakfast. 90 tablet 3  . fluticasone (FLONASE) 50 MCG/ACT nasal spray Place 2 sprays into both nostrils daily. (Patient taking differently: Place 2 sprays into both nostrils daily as needed for allergies. ) 16 g 6  . furosemide (LASIX) 40 MG tablet Take 1 tablet (40 mg total) by mouth daily. Take extra 40 mg tablet once in the afternoon AS NEEDED for weight gain 3 lbs or more. 60 tablet 3  . gabapentin (NEURONTIN) 300 MG capsule Take 2 capsules (600 mg total) by mouth 3 (three) times daily. 180  capsule 3  . loratadine (CLARITIN) 10 MG tablet Take 1 tablet (10 mg total) by mouth daily. 30 tablet 11  . meclizine (ANTIVERT) 25 MG tablet TAKE ONE TABLET BY MOUTH THREE TIMES DAILY AS NEEDED FOR  DIZZINESS 60 tablet 0  . omeprazole (PRILOSEC) 20 MG capsule Take 1 capsule (20 mg total) by mouth daily. 30 capsule 3  . potassium chloride (K-DUR,KLOR-CON) 10 MEQ tablet Take 20 mEq by mouth 2 (two) times daily.    . Riociguat (ADEMPAS) 1 MG TABS Take 1 mg 3 (three) times daily by mouth. 90 tablet 5  . rivaroxaban (XARELTO) 20 MG TABS tablet Take 1 tablet (20 mg total) by mouth daily with supper. 90 tablet 3  . spironolactone (ALDACTONE) 25 MG tablet Take 0.5 tablets (12.5 mg total) by mouth daily. 30 tablet 5   Current  Facility-Administered Medications on File Prior to Encounter  Medication Dose Route Frequency Provider Last Rate Last Dose  . heparin flush 10 UNIT/ML injection 5,000 Units  500 mL Intracatheter Q30 days [name] Pier, MD        Allergies:   [name].   Social History   Socioeconomic History  . Marital status: Married    Spouse name: None  . Number of children: None  . Years of education: None  . Highest education level: None  Social Needs  . Financial resource strain: None  . Food insecurity - worry: None  . Food insecurity - inability: None  . Transportation needs - medical: None  . Transportation needs - non-medical: None  Occupational History  . None  Tobacco Use  . Smoking status: Never Smoker  . Smokeless tobacco: Never Used  Substance and Sexual Activity  . Alcohol use: No  . Drug use: No  . Sexual activity: Yes    Partners: Male    Birth control/protection: Surgical  Other Topics Concern  . None  Social History Narrative  . None     Family History:  The patient's family history includes Anxiety disorder in her cousin, sister, and sister; Bipolar disorder in her sister and sister; Dementia in her father and mother; Drug  abuse in her sister and sister; Lung cancer in her maternal grandmother; Sexual abuse in her maternal aunt.      ROS:   Please see the history of present illness.    Review of Systems  Constitution: Positive for weakness.  HENT: Positive for hoarse voice.   Cardiovascular: Positive for dyspnea on exertion, leg swelling and near-syncope.  Respiratory: Positive for shortness of breath.   Musculoskeletal: Positive for back pain, joint pain and joint swelling.       LUE pain in bicep   All other systems reviewed and are negative.   PHYSICAL EXAM:   VS:  BP 120/66   Pulse 88   Wt (!) 336 lb 4 oz (152.5 kg)   SpO2 100%   BMI 59.56 kg/m     General:  Morbidly obese. No resp difficulty.walked slowly in the clinic.  HEENT: Hoarse Neck: supple. no JVD. Carotids 2+ bilat; no bruits. No lymphadenopathy or thryomegaly appreciated. Cor: PMI nondisplaced. Regular rate & rhythm. No rubs, gallops or murmurs. Lungs: clear Abdomen: obese, soft, nontender, nondistended. No hepatosplenomegaly. No bruits or masses. Good bowel sounds. Extremities: no cyanosis, clubbing, rash, trace edema. LUE with tenderness. No erythema or edema.   Neuro: alert & orientedx3, cranial nerves grossly intact. moves all 4 extremities w/o difficulty. Affect pleasant   Wt Readings from Last 3 Encounters:  09/01/17 (!) 336 lb 4 oz (152.5 kg)  08/26/17 (!) 334 lb (151.5 kg)  08/22/17 (!) 336 lb 6.8 oz (152.6 kg)      Studies/Labs Reviewed:    Recent Labs: 08/20/2017: B Natriuretic Peptide 12.4 08/21/2017: ALT 14 08/23/2017: BUN 18; Creatinine, Ser 1.10; Hemoglobin 12.4; Platelets 303; Potassium 4.1; Sodium 137   Lipid Panel    Component Value Date/Time   CHOL 166 06/27/2016 0346   TRIG 125 06/27/2016 0346   HDL 33 (L) 06/27/2016 0346   CHOLHDL 5.0 06/27/2016 0346   VLDL 25 06/27/2016 0346   LDLCALC 108 (H) 06/27/2016 0346    Additional studies/ records that were reviewed today include:   ECHO 8/18  EF  55-60%  RV normal RVSP 12mmHG   2D ECHO: 12/15/2015 LV EF: 55% -  60% Study Conclusions - Left ventricle: The cavity size was normal. Wall thickness was normal. Systolic function was normal. The estimated ejection  fraction was in the range of 55% to 60%. - Pulmonary arteries: PA peak pressure: 70 mm Hg (S). - Pericardium, extracardiac: A trivial pericardial effusion was   identified.   2D ECHO 04/05/16 INTERPRETATION:NORMAL LEFT VENTRICULAR FUNCTION WITH MILD LVHSEVERE RV SYSTOLIC DYSFUNCTION (See above) VALVULAR REGURGITATION: TRIVIAL PR, MILD TR NO VALVULAR STENOSIS TRIVIAL PERICARDIAL EFFUSION Compared with prior Echo study on 03/14/2016: RVSP increased from 50 to 69   CATH right heart and coronary angiography: 03/25/2016 [place] Component Name Value Ref Range  Cardiac Index (l/min/m2) 2.3 L/min/m2  Right Atrium Mean Pressure (mmHg) 11 mmHg   Right Ventricle Systolic Pressure (mmHg) 68 mmHg   Pulmonary Artery Mean Pressure (mmHg) 40 mmHg   Pulmonary Wedge Pressure (mmHg) 14 mmHg   Pulmonary Vascular Resistance (Wood units) 4.7    CT angio 05/15/16 IMPRESSION: Persistent but partially recanalized pulmonary embolus within the right lower lobe pulmonary artery. No new pulmonary embolism is identified. Minimal bibasilar atelectatic changes stable from the prior exam.   V/Q scan 06/27/16 IMPRESSION: Prominent ventilation perfusion mismatch noted on the right. Finding suggests high probability right-sided pulmonary embolus in this patient with known right-sided pulmonary embolus.   ASSESSMENT & PLAN:   1. Dizziness/palpitations -  48 hour monitor without arrhythmia.  - Continue meclizine.  - Has f/u with PCP and has been set up with ENT  2. Chronic chest pain:  - Has chronic PE s/p thrombectomy, LHC performed at [facility] 7/17 - no CAD. - Stable, no recurrence.   3. 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 Continue current regimen. RHC discussed and reviewed.   4. Hypotension Stable.    5. Fibromyalgia/Rheumatoid Arthitis:  - Follows with Rheumatology.   6. Morbid obesity with OHS:  Body mass index is 59.56 kg/m. Needs to lose considerable amount of weight.  She is considering gastric sleeve.    7. OSA - Compliant with CPAP.    8. Depression/anxiety - Major issue for her.  - Per PCP  9. LUE Pain Check LUE [place] to r/o DVT.    Follow up in a couple of months. Plan to check BMET   Greater than 50% of the (total minutes 25) visit spent in counseling/coordination of care regarding the above.     Amy Clegg NP-C  11:28 AM

## 2017-09-03 ENCOUNTER — Other Ambulatory Visit: Payer: Self-pay | Admitting: *Deleted

## 2017-09-03 ENCOUNTER — Encounter: Payer: Self-pay | Admitting: *Deleted

## 2017-09-03 NOTE — Patient Outreach (Signed)
[facility]) Care Management  09/03/2017  Mari Battaglia [facility] 13-Mar-1961 [id number]   CSW was able to make initial contact with patient today to perform phone assessment, as well as assess and assist with social work needs and services.  CSW introduced self, explained role and types of services provided through [facility] ([place] [facility]).  CSW further explained to patient that CSW works with patient's RNCM, also with [place] [facility], Thea Silversmith. CSW then explained the reason for the call, indicating that Ms. Juleen China thought that patient would benefit from social work services and resources to assist with arranging for in-home care services for patient.  CSW obtained two HIPAA compliant identifiers from patient, which included patient's name and date of birth. Patient reported that she is looking for help in the home while her husband is in the hospital and while he receives short-term rehabilitative services in a skilled nursing facility.  Patient does not have Adult Medicaid with the [place]; therefore, CSW explained to patient that home care services would be an out-of-pocket expense.  Patient admitted that she is unable to afford this type of service, expressing appreciation for the call.  In the event that patient changes her mind, CSW directed her to a website: pixomage.com for her to reference, which provides a complete list of approved preferred providers.  In addition, CSW explained that she has contacted several agencies regarding funding for Christmas, but all resources have since been exhausted for this fiscal year.  CSW encouraged patient to put in her request in September of 2019, if she still needs assistance next year.  Patient voiced understanding and was agreeable to this plan. CSW will perform a case closure on patient, as all goals of treatment have been met from social work  standpoint and no additional social work needs have been identified at this time.  CSW will notify patient's RNCM with [facility] [facility], Thea Silversmith of CSW's plans to close patient's case.  CSW will fax an update to patient's Primary Care Physician, Dr. Karle Plumber to ensure that they are aware of CSW's involvement with patient's plan of care.  CSW will submit a case closure request to Alycia Rossetti, Care Management Assistant with [facility], in the form of an [employer].  CSW will ensure that Mrs. Arelia Sneddon is aware of Ms. Wallace's, RNCM with [facility], continued involvement with patient's care. Nat Christen, BSW, MSW, LCSW  Licensed Education officer, [facility]  Mailing [place] [address] [place], [place] [address] Physical Address-[address], [place], [place] [address] Toll Free Main # [phone] Fax # [phone] Cell # [phone]  Office # [phone] [email or web address]

## 2017-09-04 ENCOUNTER — Ambulatory Visit (HOSPITAL_COMMUNITY)
Admission: RE | Admit: 2017-09-04 | Discharge: 2017-09-04 | Disposition: A | Discharge status: Home / Self Care | Payer: Medicare HMO | Source: Ambulatory Visit | Attending: Internal Medicine | Admitting: Internal Medicine

## 2017-09-04 ENCOUNTER — Encounter: Payer: Self-pay | Admitting: Internal Medicine

## 2017-09-04 ENCOUNTER — Ambulatory Visit: Payer: Medicare HMO | Attending: Internal Medicine | Admitting: Internal Medicine

## 2017-09-04 ENCOUNTER — Telehealth (HOSPITAL_COMMUNITY): Payer: Self-pay

## 2017-09-04 VITALS — BP 138/82 | HR 112 | Temp 99.0°F | Resp 16 | Wt 333.2 lb

## 2017-09-04 DIAGNOSIS — Z7901 Long term (current) use of anticoagulants: Secondary | ICD-10-CM | POA: Diagnosis not present

## 2017-09-04 DIAGNOSIS — M5416 Radiculopathy, lumbar region: Secondary | ICD-10-CM

## 2017-09-04 DIAGNOSIS — Z79899 Other long term (current) drug therapy: Secondary | ICD-10-CM | POA: Diagnosis not present

## 2017-09-04 DIAGNOSIS — G894 Chronic pain syndrome: Secondary | ICD-10-CM | POA: Insufficient documentation

## 2017-09-04 DIAGNOSIS — I10 Essential (primary) hypertension: Secondary | ICD-10-CM | POA: Diagnosis not present

## 2017-09-04 DIAGNOSIS — R05 Cough: Secondary | ICD-10-CM | POA: Diagnosis not present

## 2017-09-04 DIAGNOSIS — J984 Other disorders of lung: Secondary | ICD-10-CM | POA: Diagnosis not present

## 2017-09-04 DIAGNOSIS — G4733 Obstructive sleep apnea (adult) (pediatric): Secondary | ICD-10-CM | POA: Insufficient documentation

## 2017-09-04 DIAGNOSIS — J111 Influenza due to unidentified influenza virus with other respiratory manifestations: Secondary | ICD-10-CM | POA: Insufficient documentation

## 2017-09-04 MED ORDER — AZITHROMYCIN 250 MG PO TABS: ORAL_TABLET | ORAL | 0 refills | Status: DC

## 2017-09-04 MED ORDER — ACETAMINOPHEN-CODEINE #4 300-60 MG PO TABS: 1.0000 | ORAL_TABLET | Freq: Three times a day (TID) | ORAL | 1 refills | Status: DC | PRN

## 2017-09-04 MED ORDER — OSELTAMIVIR PHOSPHATE 75 MG PO CAPS: 75.0000 mg | ORAL_CAPSULE | Freq: Two times a day (BID) | ORAL | 0 refills | Status: DC

## 2017-09-04 NOTE — Progress Notes (Signed)
Patient ID: [name] [name], female    DOB: 19-Apr-1961  MRN: [id number]  CC: URI (sympotms)   Subjective: [name] [name] is a 56 y.o. female who presents for UC. Husband is with her. Her concerns today include:  56 year old female with history of HTN, morbid obesity, PAH due to CTEPHstatus post thromboembolectomy 7/17 with IVC filter placement and now on [place], OSA, anxiety/depression, chronic pain syndrome with fibromyalgia and questionable RA, chronic chest pain, chronic anemia on iron,  Pt c/o not feeling well x 4 days.  C/o having fever/chills, sore throat, cough that was blood tinge  -chest tightness, feeling achy, frontal HA, rhinorrhea Highest temp was 101.9 last evening -taking Tussionex, OTC Tylenol and [place] cough drops She held off on taking the Tylenol #4 while she was taking regular OTC Tylenol She did have flu shot this fall No sick contacts at home Patient Active Problem List   Diagnosis Date Noted  . CHF exacerbation ([place]) 08/21/2017  . (HFpEF) heart failure with preserved ejection fraction ([place]) 08/20/2017  . Lumbar radiculopathy 08/08/2017  . Morbid obesity with BMI of 50.0-59.9, adult ([place]) 08/08/2017  . Hoarseness 08/08/2017  . Chronic cough 04/10/2017  . Rheumatoid arthritis involving multiple sites ([place]) 04/10/2017  . Anxiety and depression 04/10/2017  . Adjustment disorder with depressed mood 12/11/2016  . Morbid obesity ([place]) 08/14/2016  . Stroke ([place]) 06/27/2016  . Right sided weakness 06/27/2016  . Essential hypertension 06/27/2016  . Atypical chest pain 06/27/2016  . Difficult airway 03/29/2016  . CTEPH (chronic thromboembolic pulmonary hypertension) ([place]) 03/14/2016  . OSA on CPAP 02/20/2016  . Chronic pain syndrome 01/31/2016  . Pulmonary embolus ([place]) 11/15/2015  . Vertigo 11/15/2015  . Depression 11/15/2015     Current Outpatient Medications on File Prior to Visit  Medication Sig Dispense Refill  . albuterol (PROVENTIL HFA;VENTOLIN  HFA) 108 (90 Base) MCG/ACT inhaler Inhale 1-2 puffs into the lungs every 6 (six) hours as needed for wheezing or shortness of breath. 6.7 g 5  . amitriptyline (ELAVIL) 10 MG tablet Take 1 tablet (10 mg total) by mouth at bedtime. 30 tablet 2  . atorvastatin (LIPITOR) 20 MG tablet Take 1 tablet (20 mg total) by mouth daily at 6 PM. 90 tablet 2  . cyclobenzaprine (FLEXERIL) 5 MG tablet Take 1 tablet (5 mg total) by mouth 2 (two) times daily as needed. for muscle spams 40 tablet 1  . docusate sodium (COLACE) 100 MG capsule Take 1 capsule (100 mg total) by mouth every 12 (twelve) hours. 30 capsule 0  . ferrous sulfate (FERROUSUL) 325 (65 FE) MG tablet Take 1 tablet (325 mg total) by mouth daily with breakfast. 90 tablet 3  . fluticasone (FLONASE) 50 MCG/ACT nasal spray Place 2 sprays into both nostrils daily. (Patient taking differently: Place 2 sprays into both nostrils daily as needed for allergies. ) 16 g 6  . furosemide (LASIX) 40 MG tablet Take 1 tablet (40 mg total) by mouth daily. Take extra 40 mg tablet once in the afternoon AS NEEDED for weight gain 3 lbs or more. 60 tablet 3  . gabapentin (NEURONTIN) 300 MG capsule Take 2 capsules (600 mg total) by mouth 3 (three) times daily. 180 capsule 3  . loratadine (CLARITIN) 10 MG tablet Take 1 tablet (10 mg total) by mouth daily. 30 tablet 11  . meclizine (ANTIVERT) 25 MG tablet TAKE ONE TABLET BY MOUTH THREE TIMES DAILY AS NEEDED FOR  DIZZINESS 60 tablet 0  . omeprazole (PRILOSEC) 20 MG  capsule Take 1 capsule (20 mg total) by mouth daily. 30 capsule 3  . potassium chloride (K-DUR,KLOR-CON) 10 MEQ tablet Take 20 mEq by mouth 2 (two) times daily.    . Riociguat (ADEMPAS) 1 MG TABS Take 1 mg by mouth 3 (three) times daily. 90 tablet 5  . rivaroxaban (XARELTO) 20 MG TABS tablet Take 1 tablet (20 mg total) by mouth daily with supper. 90 tablet 3  . spironolactone (ALDACTONE) 25 MG tablet Take 0.5 tablets (12.5 mg total) by mouth daily. 30 tablet 5    Current Facility-Administered Medications on File Prior to Visit  Medication Dose Route Frequency Provider Last Rate Last Dose  . heparin flush 10 UNIT/ML injection 5,000 Units  500 mL Intracatheter Q30 days Ladell Pier, MD        No Known Allergies  Social History   Socioeconomic History  . Marital status: Married    Spouse name: Not on file  . Number of children: Not on file  . Years of education: Not on file  . Highest education level: Not on file  Social Needs  . Financial resource strain: Not on file  . Food insecurity - worry: Not on file  . Food insecurity - inability: Not on file  . Transportation needs - medical: Not on file  . Transportation needs - non-medical: Not on file  Occupational History  . Not on file  Tobacco Use  . Smoking status: Never Smoker  . Smokeless tobacco: Never Used  Substance and Sexual Activity  . Alcohol use: No  . Drug use: No  . Sexual activity: Yes    Partners: Male    Birth control/protection: Surgical  Other Topics Concern  . Not on file  Social History Narrative  . Not on file    Family History  Problem Relation Age of Onset  . Lung cancer Maternal Grandmother   . Dementia Mother   . Dementia Father   . Anxiety disorder Sister   . Bipolar disorder Sister   . Drug abuse Sister   . Sexual abuse Maternal Aunt   . Anxiety disorder Cousin   . Anxiety disorder Sister   . Bipolar disorder Sister   . Drug abuse Sister   . Breast cancer Neg Hx     Past Surgical History:  Procedure Laterality Date  . ABDOMINAL HYSTERECTOMY    . BREAST REDUCTION SURGERY    . CHOLECYSTECTOMY    . EMBOLECTOMY N/A    pulmonary embolectomy  . REDUCTION MAMMAPLASTY Bilateral   . RIGHT HEART CATH N/A 10/28/2016   Procedure: Right Heart Cath;  Surgeon: Jolaine Artist, MD;  Location: [place] CV LAB;  Service: Cardiovascular;  Laterality: N/A;  . RIGHT HEART CATH N/A 08/26/2017   Procedure: RIGHT HEART CATH;  Surgeon: Jolaine Artist, MD;  Location: [place] CV LAB;  Service: Cardiovascular;  Laterality: N/A;  . ULTRASOUND GUIDANCE FOR VASCULAR ACCESS  08/26/2017   Procedure: Ultrasound Guidance For Vascular Access;  Surgeon: Jolaine Artist, MD;  Location: [place] CV LAB;  Service: Cardiovascular;;    ROS: Review of Systems Neg except as above PHYSICAL EXAM: BP 138/82   Pulse (!) 112   Temp 99 F (37.2 C) (Oral)   Resp 16   Wt (!) 333 lb 3.2 oz (151.1 kg)   SpO2 94%   BMI 59.02 kg/m   Wt Readings from Last 3 Encounters:  09/04/17 (!) 333 lb 3.2 oz (151.1 kg)  09/01/17 (!) 336 lb  4 oz (152.5 kg)  08/26/17 (!) 334 lb (151.5 kg)    Physical Exam  General appearance - alert, and appears unwell Mental status - alert, oriented to person, place, and time, normal mood, behavior, speech, dress, motor activity, and thought processes Nose - mucosal congestion Mouth - mucous membranes moist, pharynx normal without lesions Neck - supple, no significant adenopathy Chest - few scattered wheezes but otherwise clear Heart - slightly tachy but regular   ASSESSMENT AND PLAN: 1. Influenza Vs bronchitis Even though it has been more than 48 hrs since symptoms started, I will still put her on Tamaflu given her co-morbidities.  - azithromycin (ZITHROMAX) 250 MG tablet; 2 tabs PO x 1 then 1 tab daily  Dispense: 6 tablet; Refill: 0 - oseltamivir (TAMIFLU) 75 MG capsule; Take 1 capsule (75 mg total) by mouth 2 (two) times daily.  Dispense: 10 capsule; Refill: 0 - DG Chest 2 View; Future -advise not to take Tylenol #4 if she is taking OTC Tylenol to avoid taking too much  2. Lumbar radiculopathy NCCSRS reviewed and is appropriate. RF given on Tylenol #4 - acetaminophen-codeine (TYLENOL #4) 300-60 MG tablet; Take 1 tablet by mouth every 8 (eight) hours as needed for moderate pain.  Dispense: 70 tablet; Refill: 1  Patient was given the opportunity to ask questions.  Patient verbalized understanding of the  plan and was able to repeat key elements of the plan.   Orders Placed This Encounter  Procedures  . DG Chest 2 View     Requested Prescriptions   Signed Prescriptions Disp Refills  . azithromycin (ZITHROMAX) 250 MG tablet 6 tablet 0    Sig: 2 tabs PO x 1 then 1 tab daily  . oseltamivir (TAMIFLU) 75 MG capsule 10 capsule 0    Sig: Take 1 capsule (75 mg total) by mouth 2 (two) times daily.  [place] Kitchen acetaminophen-codeine (TYLENOL #4) 300-60 MG tablet 70 tablet 1    Sig: Take 1 tablet by mouth every 8 (eight) hours as needed for moderate pain.    No Follow-up on file.  Karle Plumber, MD, FACP

## 2017-09-04 NOTE — Patient Instructions (Signed)

## 2017-09-04 NOTE — Telephone Encounter (Signed)
I called [name] [name] to schedule an appointment. A person at her house answered and said she was not home but would deliver the message to call me back.

## 2017-09-05 ENCOUNTER — Ambulatory Visit (HOSPITAL_COMMUNITY): Admission: RE | Admit: 2017-09-05 | Payer: Medicare HMO | Source: Ambulatory Visit

## 2017-09-05 ENCOUNTER — Other Ambulatory Visit (HOSPITAL_COMMUNITY): Payer: Self-pay

## 2017-09-05 ENCOUNTER — Encounter: Payer: Self-pay | Admitting: Internal Medicine

## 2017-09-05 ENCOUNTER — Encounter (HOSPITAL_COMMUNITY): Payer: Self-pay

## 2017-09-05 ENCOUNTER — Telehealth: Payer: Self-pay | Admitting: Internal Medicine

## 2017-09-05 ENCOUNTER — Telehealth (HOSPITAL_COMMUNITY): Payer: Self-pay

## 2017-09-05 MED ORDER — BENZONATATE 100 MG PO CAPS: 100.0000 mg | ORAL_CAPSULE | Freq: Two times a day (BID) | ORAL | 0 refills | Status: DC | PRN

## 2017-09-05 NOTE — Telephone Encounter (Signed)
VO given over the phone to pharmacist [facility] for overnight [place] shipment with refills per Dr. Haroldine Laws order.  Renee Pain, RN

## 2017-09-05 NOTE — Telephone Encounter (Signed)
Pt called to get a cough medication to please sent it to [facility] on [address], she was here and did not got any medication for her cough, please follow up

## 2017-09-08 ENCOUNTER — Telehealth (HOSPITAL_COMMUNITY): Payer: Self-pay

## 2017-09-08 DIAGNOSIS — J9611 Chronic respiratory failure with hypoxia: Secondary | ICD-10-CM | POA: Diagnosis not present

## 2017-09-08 DIAGNOSIS — G4733 Obstructive sleep apnea (adult) (pediatric): Secondary | ICD-10-CM | POA: Diagnosis not present

## 2017-09-08 DIAGNOSIS — I2724 Chronic thromboembolic pulmonary hypertension: Secondary | ICD-10-CM | POA: Diagnosis not present

## 2017-09-08 DIAGNOSIS — M1990 Unspecified osteoarthritis, unspecified site: Secondary | ICD-10-CM | POA: Diagnosis not present

## 2017-09-08 DIAGNOSIS — M069 Rheumatoid arthritis, unspecified: Secondary | ICD-10-CM | POA: Diagnosis not present

## 2017-09-08 DIAGNOSIS — I10 Essential (primary) hypertension: Secondary | ICD-10-CM | POA: Diagnosis not present

## 2017-09-08 DIAGNOSIS — G894 Chronic pain syndrome: Secondary | ICD-10-CM | POA: Diagnosis not present

## 2017-09-08 DIAGNOSIS — F329 Major depressive disorder, single episode, unspecified: Secondary | ICD-10-CM | POA: Diagnosis not present

## 2017-09-08 DIAGNOSIS — K5793 Diverticulitis of intestine, part unspecified, without perforation or abscess with bleeding: Secondary | ICD-10-CM | POA: Diagnosis not present

## 2017-09-08 NOTE — Telephone Encounter (Signed)
I called [name] [name] to schedule an appointment. She is still feeling sick from the flu and does not want a visit. I will check in with her later in the week.

## 2017-09-11 ENCOUNTER — Telehealth: Payer: Self-pay | Admitting: Internal Medicine

## 2017-09-11 DIAGNOSIS — I2724 Chronic thromboembolic pulmonary hypertension: Secondary | ICD-10-CM | POA: Diagnosis not present

## 2017-09-11 DIAGNOSIS — G894 Chronic pain syndrome: Secondary | ICD-10-CM | POA: Diagnosis not present

## 2017-09-11 DIAGNOSIS — J9611 Chronic respiratory failure with hypoxia: Secondary | ICD-10-CM | POA: Diagnosis not present

## 2017-09-11 DIAGNOSIS — F329 Major depressive disorder, single episode, unspecified: Secondary | ICD-10-CM | POA: Diagnosis not present

## 2017-09-11 DIAGNOSIS — M069 Rheumatoid arthritis, unspecified: Secondary | ICD-10-CM | POA: Diagnosis not present

## 2017-09-11 DIAGNOSIS — M1990 Unspecified osteoarthritis, unspecified site: Secondary | ICD-10-CM | POA: Diagnosis not present

## 2017-09-11 DIAGNOSIS — I10 Essential (primary) hypertension: Secondary | ICD-10-CM | POA: Diagnosis not present

## 2017-09-11 DIAGNOSIS — K5793 Diverticulitis of intestine, part unspecified, without perforation or abscess with bleeding: Secondary | ICD-10-CM | POA: Diagnosis not present

## 2017-09-11 DIAGNOSIS — G4733 Obstructive sleep apnea (adult) (pediatric): Secondary | ICD-10-CM | POA: Diagnosis not present

## 2017-09-11 NOTE — Telephone Encounter (Signed)
PC placed to pt this evening in reply to Mychart message that was forwarded to me from [facility] stating that pt completed course of tamiflu and still not feeling well. Per the sender, pt sounded SOB.  I left message on pt's mobile voicemail telling her to be seen in ER if still not feeling well and SOB.

## 2017-09-11 NOTE — Telephone Encounter (Signed)
Patient called to notify PCP she has completed 10 course of medications regarding the flu and still feels bad. Patient stated she is using oxygen more then usual. Patient is SOB when talking and wanted pcp to know something is going on. Please follow up with patient. Patient also wanted an update on referrals status.

## 2017-09-12 ENCOUNTER — Ambulatory Visit: Payer: Self-pay

## 2017-09-15 ENCOUNTER — Other Ambulatory Visit: Payer: Self-pay

## 2017-09-15 ENCOUNTER — Telehealth: Payer: Self-pay | Admitting: Internal Medicine

## 2017-09-15 DIAGNOSIS — G4733 Obstructive sleep apnea (adult) (pediatric): Secondary | ICD-10-CM | POA: Diagnosis not present

## 2017-09-15 DIAGNOSIS — I2724 Chronic thromboembolic pulmonary hypertension: Secondary | ICD-10-CM | POA: Diagnosis not present

## 2017-09-15 DIAGNOSIS — R0602 Shortness of breath: Secondary | ICD-10-CM | POA: Diagnosis not present

## 2017-09-15 IMAGING — CR DG CHEST 2V
2 series · 2 of 2 positions shown · non-contrast
Comparison: 05/15/2016

CLINICAL DATA: Right-sided chest pain and shortness of Breath

EXAM:
CHEST  2 VIEW

[w chest pa]
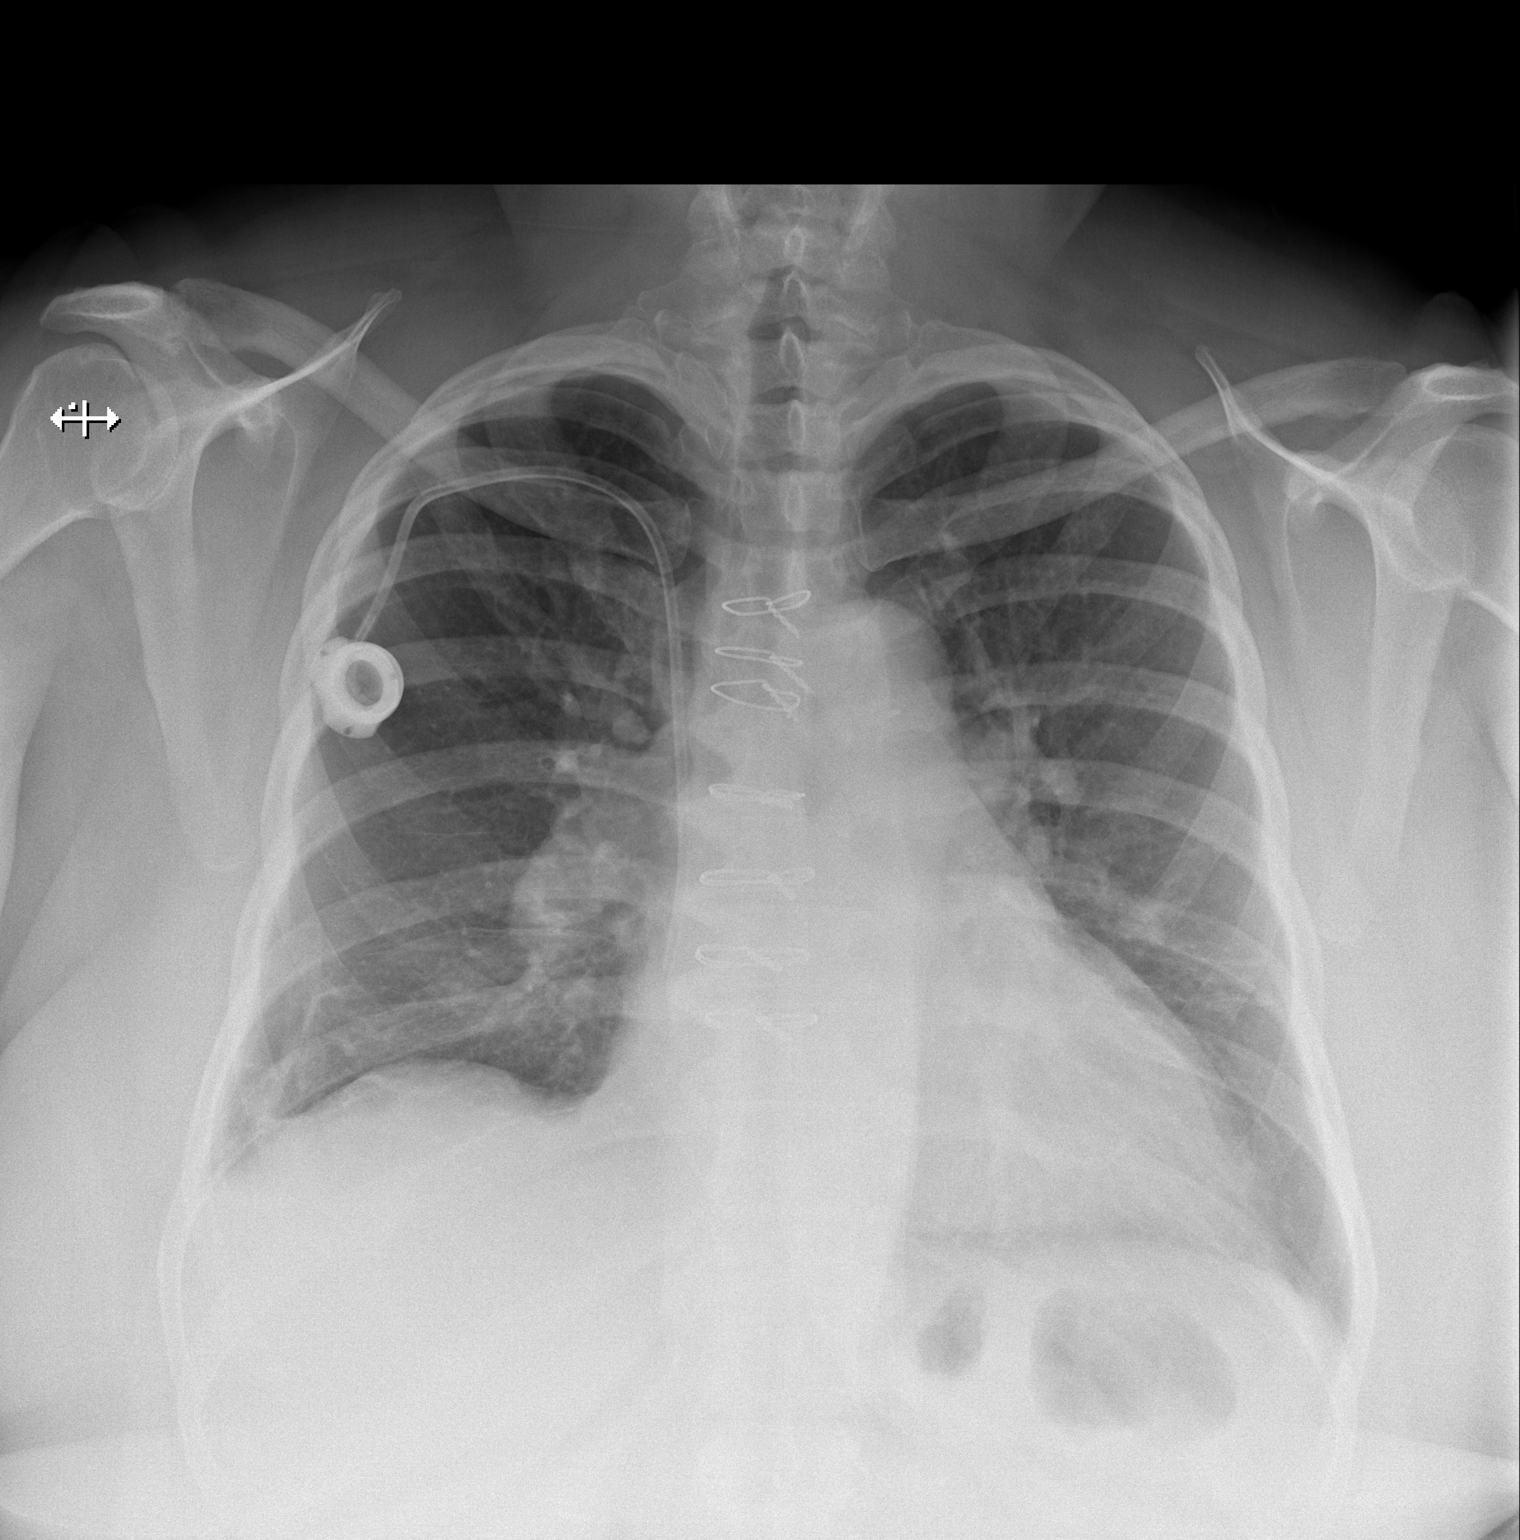

[w chest lat]
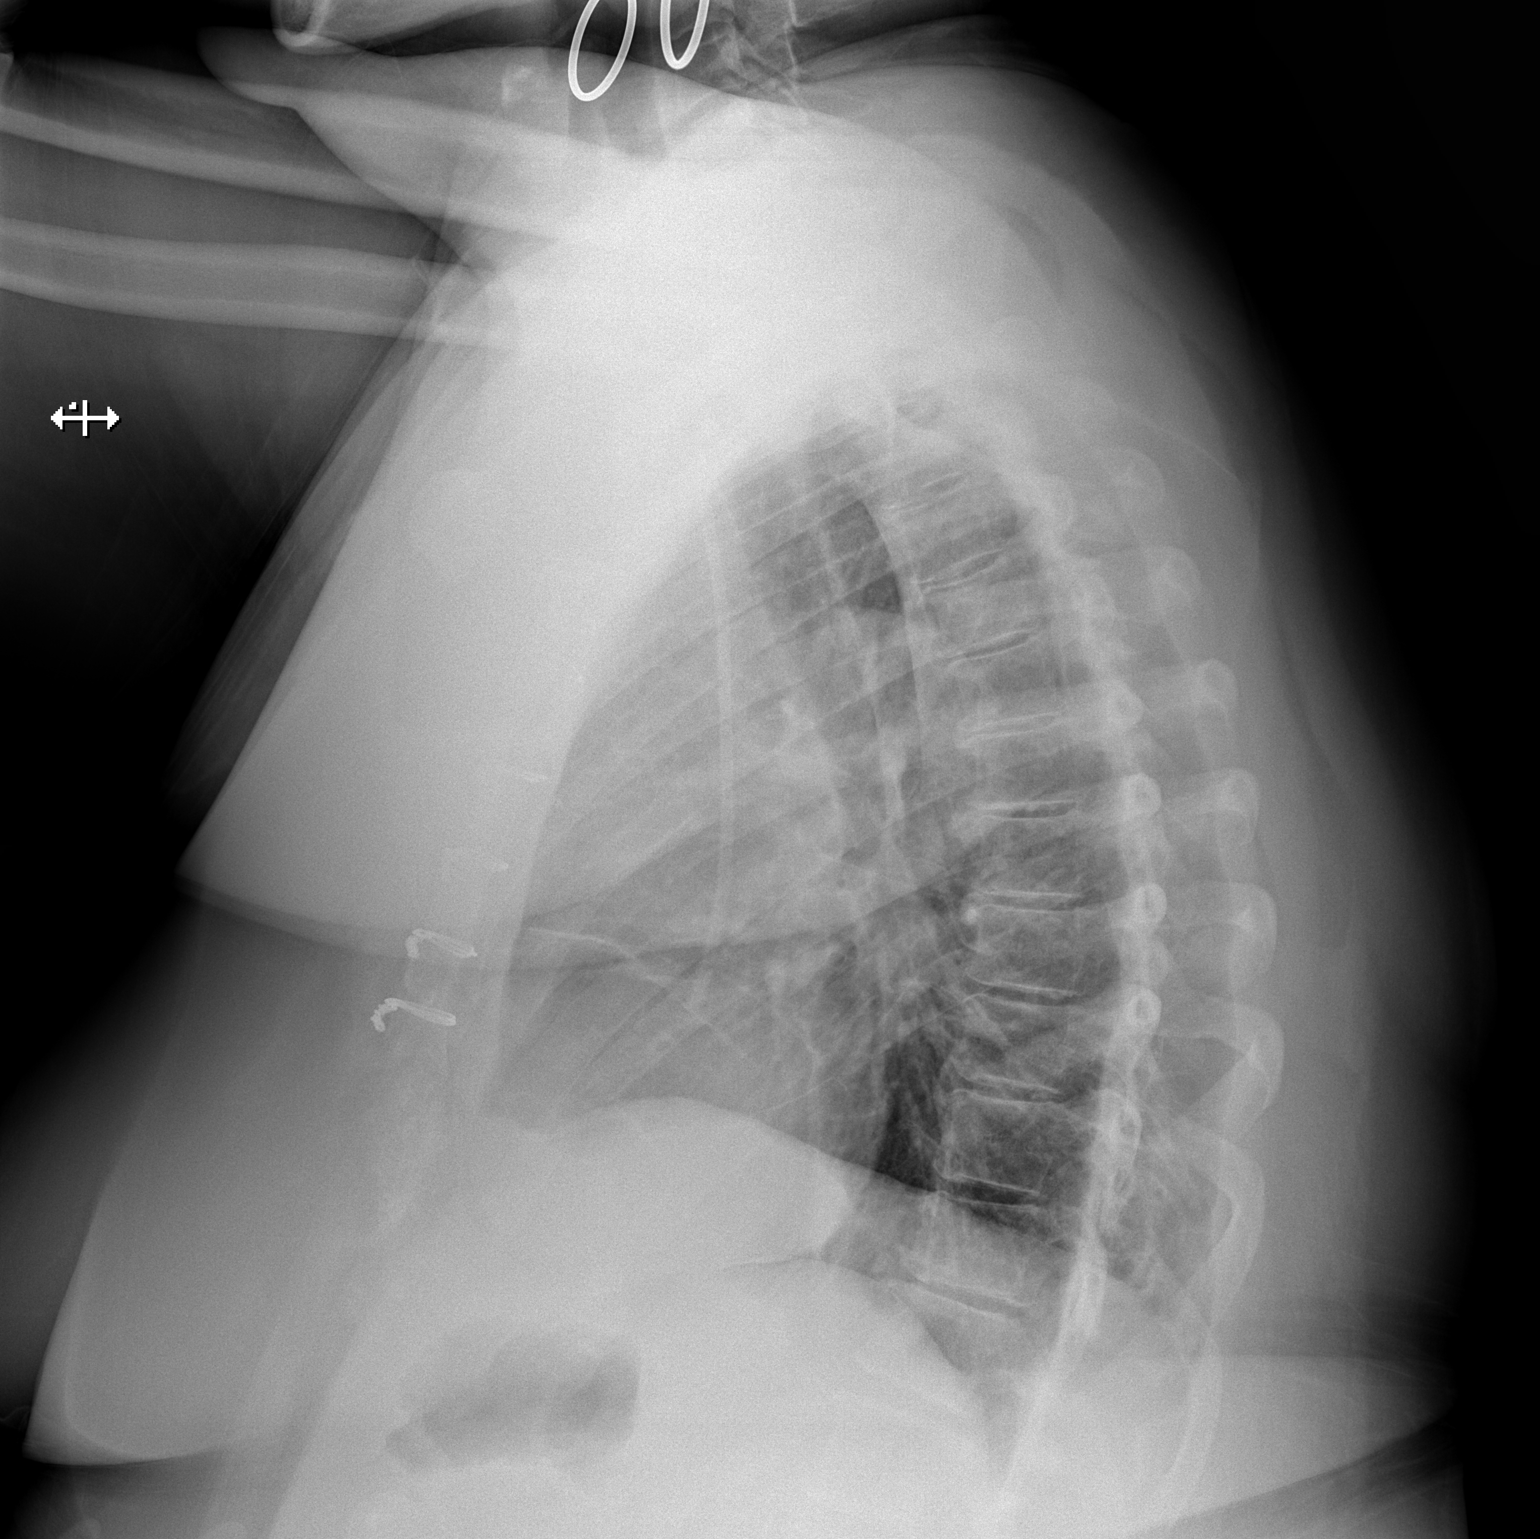

[2 of 2 positions shown; findings below may reference images not displayed]

FINDINGS: Cardiac shadow is stable. Postoperative changes are again seen. A
right chest wall port is again seen and stable. Lungs are well
aerated bilaterally. Some persistent scarring in the lateral lung
base on the right is noted. No focal infiltrate or sizable effusion
is seen. No acute bony abnormality is noted.
IMPRESSION: Chronic scarring in the right base.  No acute abnormality is noted.

## 2017-09-15 NOTE — Telephone Encounter (Signed)
[facility] ([place]) calle to request an OT Order and Home health order for activity daily living and bedding, please call them back

## 2017-09-15 NOTE — Patient Outreach (Signed)
[place] [facility]) Care Management  09/15/2017  Manuelita Moxon [facility] Jul 16, 1961 [id number]   Care Coordination: RNCM called to rescheduled home visit. Person answering phone stated she is her sister and Client is not home. HIPPA compliant message left.  Plan: await return call and follow up within 1-2 weeks.  Thea Silversmith, RN, MSN, [place] Coordinator Cell: [phone]

## 2017-09-16 ENCOUNTER — Encounter (HOSPITAL_COMMUNITY): Payer: Self-pay | Admitting: Emergency Medicine

## 2017-09-16 ENCOUNTER — Other Ambulatory Visit: Payer: Self-pay

## 2017-09-16 ENCOUNTER — Emergency Department (HOSPITAL_COMMUNITY): Payer: Medicare HMO

## 2017-09-16 ENCOUNTER — Other Ambulatory Visit (HOSPITAL_COMMUNITY): Payer: Self-pay

## 2017-09-16 ENCOUNTER — Telehealth: Payer: Self-pay | Admitting: Internal Medicine

## 2017-09-16 DIAGNOSIS — R05 Cough: Secondary | ICD-10-CM | POA: Diagnosis present

## 2017-09-16 DIAGNOSIS — I509 Heart failure, unspecified: Secondary | ICD-10-CM | POA: Diagnosis not present

## 2017-09-16 DIAGNOSIS — I110 Hypertensive heart disease with heart failure: Secondary | ICD-10-CM | POA: Insufficient documentation

## 2017-09-16 DIAGNOSIS — F329 Major depressive disorder, single episode, unspecified: Secondary | ICD-10-CM | POA: Insufficient documentation

## 2017-09-16 DIAGNOSIS — Z9049 Acquired absence of other specified parts of digestive tract: Secondary | ICD-10-CM | POA: Insufficient documentation

## 2017-09-16 DIAGNOSIS — J40 Bronchitis, not specified as acute or chronic: Secondary | ICD-10-CM | POA: Insufficient documentation

## 2017-09-16 DIAGNOSIS — G4733 Obstructive sleep apnea (adult) (pediatric): Secondary | ICD-10-CM | POA: Diagnosis not present

## 2017-09-16 DIAGNOSIS — I10 Essential (primary) hypertension: Secondary | ICD-10-CM | POA: Diagnosis not present

## 2017-09-16 DIAGNOSIS — F4321 Adjustment disorder with depressed mood: Secondary | ICD-10-CM | POA: Insufficient documentation

## 2017-09-16 DIAGNOSIS — J9611 Chronic respiratory failure with hypoxia: Secondary | ICD-10-CM | POA: Diagnosis not present

## 2017-09-16 DIAGNOSIS — Z79899 Other long term (current) drug therapy: Secondary | ICD-10-CM | POA: Insufficient documentation

## 2017-09-16 DIAGNOSIS — F419 Anxiety disorder, unspecified: Secondary | ICD-10-CM | POA: Diagnosis not present

## 2017-09-16 DIAGNOSIS — M069 Rheumatoid arthritis, unspecified: Secondary | ICD-10-CM | POA: Diagnosis not present

## 2017-09-16 DIAGNOSIS — Z7901 Long term (current) use of anticoagulants: Secondary | ICD-10-CM | POA: Diagnosis not present

## 2017-09-16 DIAGNOSIS — J811 Chronic pulmonary edema: Secondary | ICD-10-CM | POA: Diagnosis not present

## 2017-09-16 DIAGNOSIS — R079 Chest pain, unspecified: Secondary | ICD-10-CM | POA: Diagnosis not present

## 2017-09-16 DIAGNOSIS — K5793 Diverticulitis of intestine, part unspecified, without perforation or abscess with bleeding: Secondary | ICD-10-CM | POA: Diagnosis not present

## 2017-09-16 DIAGNOSIS — G894 Chronic pain syndrome: Secondary | ICD-10-CM | POA: Diagnosis not present

## 2017-09-16 DIAGNOSIS — M1990 Unspecified osteoarthritis, unspecified site: Secondary | ICD-10-CM | POA: Diagnosis not present

## 2017-09-16 DIAGNOSIS — R0602 Shortness of breath: Secondary | ICD-10-CM | POA: Diagnosis not present

## 2017-09-16 DIAGNOSIS — I2724 Chronic thromboembolic pulmonary hypertension: Secondary | ICD-10-CM | POA: Diagnosis not present

## 2017-09-16 LAB — BASIC METABOLIC PANEL
Anion gap: 9 (ref 5–15)
BUN: 14 mg/dL (ref 6–20)
CO2: 24 mmol/L (ref 22–32)
Calcium: 9.4 mg/dL (ref 8.9–10.3)
Chloride: 107 mmol/L (ref 101–111)
Creatinine, Ser: 1.06 mg/dL — ABNORMAL HIGH (ref 0.44–1.00)
GFR calc Af Amer: >60 mL/min (ref >60)
GFR calc non Af Amer: 58 mL/min — ABNORMAL LOW (ref >60)
Glucose, Bld: 92 mg/dL (ref 65–99)
Potassium: 3.9 mmol/L (ref 3.5–5.1)
Sodium: 140 mmol/L (ref 135–145)

## 2017-09-16 LAB — CBC
HCT: 34.3 % — ABNORMAL LOW (ref 36.0–46.0)
Hemoglobin: 10.7 g/dL — ABNORMAL LOW (ref 12.0–15.0)
MCH: 20.3 pg — ABNORMAL LOW (ref 26.0–34.0)
MCHC: 31.2 g/dL (ref 30.0–36.0)
MCV: 65 fL — ABNORMAL LOW (ref 78.0–100.0)
Platelets: 350 10*3/uL (ref 150–400)
RBC: 5.28 MIL/uL — ABNORMAL HIGH (ref 3.87–5.11)
RDW: 17.9 % — ABNORMAL HIGH (ref 11.5–15.5)
WBC: 7.2 10*3/uL (ref 4.0–10.5)

## 2017-09-16 LAB — I-STAT BETA HCG BLOOD, ED (MC, WL, AP ONLY): I-stat hCG, quantitative: <5 m[IU]/mL (ref <5)

## 2017-09-16 LAB — I-STAT TROPONIN, ED: Troponin i, poc: 0 ng/mL (ref 0.00–0.08)

## 2017-09-16 NOTE — Telephone Encounter (Signed)
Spoke to [employer] and she stated pt has chest congestion, productive cough, not feeling well overall but she doesn't have a temp. I gave the ok to do the mobile chest xray.

## 2017-09-16 NOTE — Progress Notes (Signed)
Paramedicine Encounter    Patient ID: [name] [name], female    DOB: [birth year], 57 y.o.   MRN: [id number]   Patient Care Team: Ladell Pier, MD as PCP - General (Internal Medicine) Luretha Rued, RN as [place] Management  Patient Active Problem List   Diagnosis Date Noted  . CHF exacerbation ([place]) 08/21/2017  . (HFpEF) heart failure with preserved ejection fraction ([place]) 08/20/2017  . Lumbar radiculopathy 08/08/2017  . Morbid obesity with BMI of 50.0-59.9, adult ([place]) 08/08/2017  . Hoarseness 08/08/2017  . Chronic cough 04/10/2017  . Rheumatoid arthritis involving multiple sites ([place]) 04/10/2017  . Anxiety and depression 04/10/2017  . Adjustment disorder with depressed mood 12/11/2016  . Morbid obesity ([place]) 08/14/2016  . Stroke ([place]) 06/27/2016  . Right sided weakness 06/27/2016  . Essential hypertension 06/27/2016  . Atypical chest pain 06/27/2016  . Difficult airway 03/29/2016  . CTEPH (chronic thromboembolic pulmonary hypertension) ([place]) 03/14/2016  . OSA on CPAP 02/20/2016  . Chronic pain syndrome 01/31/2016  . Pulmonary embolus ([place]) 11/15/2015  . Vertigo 11/15/2015  . Depression 11/15/2015    Current Outpatient Medications:  .  acetaminophen-codeine (TYLENOL #4) 300-60 MG tablet, Take 1 tablet by mouth every 8 (eight) hours as needed for moderate pain., Disp: 70 tablet, Rfl: 1 .  albuterol (PROVENTIL HFA;VENTOLIN HFA) 108 (90 Base) MCG/ACT inhaler, Inhale 1-2 puffs into the lungs every 6 (six) hours as needed for wheezing or shortness of breath., Disp: 6.7 g, Rfl: 5 .  amitriptyline (ELAVIL) 10 MG tablet, Take 1 tablet (10 mg total) by mouth at bedtime., Disp: 30 tablet, Rfl: 2 .  atorvastatin (LIPITOR) 20 MG tablet, Take 1 tablet (20 mg total) by mouth daily at 6 PM., Disp: 90 tablet, Rfl: 2 .  benzonatate (TESSALON) 100 MG capsule, Take 1 capsule (100 mg total) by mouth 2 (two) times daily as needed for cough., Disp: 30 capsule,  Rfl: 0 .  cyclobenzaprine (FLEXERIL) 5 MG tablet, Take 1 tablet (5 mg total) by mouth 2 (two) times daily as needed. for muscle spams, Disp: 40 tablet, Rfl: 1 .  docusate sodium (COLACE) 100 MG capsule, Take 1 capsule (100 mg total) by mouth every 12 (twelve) hours., Disp: 30 capsule, Rfl: 0 .  ferrous sulfate (FERROUSUL) 325 (65 FE) MG tablet, Take 1 tablet (325 mg total) by mouth daily with breakfast., Disp: 90 tablet, Rfl: 3 .  fluticasone (FLONASE) 50 MCG/ACT nasal spray, Place 2 sprays into both nostrils daily. (Patient taking differently: Place 2 sprays into both nostrils daily as needed for allergies. ), Disp: 16 g, Rfl: 6 .  furosemide (LASIX) 40 MG tablet, Take 1 tablet (40 mg total) by mouth daily. Take extra 40 mg tablet once in the afternoon AS NEEDED for weight gain 3 lbs or more., Disp: 60 tablet, Rfl: 3 .  gabapentin (NEURONTIN) 300 MG capsule, Take 2 capsules (600 mg total) by mouth 3 (three) times daily., Disp: 180 capsule, Rfl: 3 .  loratadine (CLARITIN) 10 MG tablet, Take 1 tablet (10 mg total) by mouth daily., Disp: 30 tablet, Rfl: 11 .  meclizine (ANTIVERT) 25 MG tablet, TAKE ONE TABLET BY MOUTH THREE TIMES DAILY AS NEEDED FOR  DIZZINESS, Disp: 60 tablet, Rfl: 0 .  omeprazole (PRILOSEC) 20 MG capsule, Take 1 capsule (20 mg total) by mouth daily., Disp: 30 capsule, Rfl: 3 .  oseltamivir (TAMIFLU) 75 MG capsule, Take 1 capsule (75 mg total) by mouth 2 (two) times daily., Disp: 10 capsule,  Rfl: 0 .  potassium chloride (K-DUR,KLOR-CON) 10 MEQ tablet, Take 20 mEq by mouth 2 (two) times daily., Disp: , Rfl:  .  Riociguat (ADEMPAS) 1 MG TABS, Take 1 mg by mouth 3 (three) times daily., Disp: 90 tablet, Rfl: 5 .  rivaroxaban (XARELTO) 20 MG TABS tablet, Take 1 tablet (20 mg total) by mouth daily with supper., Disp: 90 tablet, Rfl: 3 .  spironolactone (ALDACTONE) 25 MG tablet, Take 0.5 tablets (12.5 mg total) by mouth daily., Disp: 30 tablet, Rfl: 5 .  azithromycin (ZITHROMAX) 250 MG  tablet, 2 tabs PO x 1 then 1 tab daily (Patient not taking: Reported on 09/16/2017), Disp: 6 tablet, Rfl: 0  Current Facility-Administered Medications:  .  heparin flush 10 UNIT/ML injection 5,000 Units, 500 mL, Intracatheter, Q30 days, Ladell Pier, MD No Known Allergies    Social History   Socioeconomic History  . Marital status: Married    Spouse name: Not on file  . Number of children: Not on file  . Years of education: Not on file  . Highest education level: Not on file  Social Needs  . Financial resource strain: Not on file  . Food insecurity - worry: Not on file  . Food insecurity - inability: Not on file  . Transportation needs - medical: Not on file  . Transportation needs - non-medical: Not on file  Occupational History  . Not on file  Tobacco Use  . Smoking status: Never Smoker  . Smokeless tobacco: Never Used  Substance and Sexual Activity  . Alcohol use: No  . Drug use: No  . Sexual activity: Yes    Partners: Male    Birth control/protection: Surgical  Other Topics Concern  . Not on file  Social History Narrative  . Not on file    Physical Exam  Constitutional: She is oriented to person, place, and time.  Cardiovascular: Normal rate and regular rhythm.  Pulmonary/Chest: Effort normal and breath sounds normal. No respiratory distress. She has no wheezes. She has no rales.  Abdominal: Soft.  Musculoskeletal: Normal range of motion. She exhibits edema.  Neurological: She is alert and oriented to person, place, and time.  Skin: Skin is warm and dry.        Future Appointments  Date Time Provider [place]  09/19/2017  3:00 PM Luretha Rued, RN [facility]-COM None  09/22/2017  4:00 PM [name], MD L[facility]-PULCARE None  09/25/2017 10:00 AM WL-MDCC ROOM [facility]-MD[facility] None  10/27/2017  8:00 AM [facility]-MD[facility] ROOM [facility]-MDCC None  10/27/2017  1:40 PM Bensimhon, Shaune Pascal, MD [facility]-HVSC None  11/07/2017 11:15 AM Ladell Pier, MD CH[facility]-CH[facility] None    BP 140/80 (BP  Location: Right Arm, Patient Position: Sitting, Cuff Size: Large)   Pulse (!) 108   Resp 20   Wt (!) 336 lb (152.4 kg)   SpO2 98%   BMI 59.52 kg/m   Weight yesterday- Did not weigh Last visit weight- 336 lb  Ms Caralyn Guile was seen at home today and reported feeling unwell. She has had a productive cough for the past week with brown/yellow phlegm. PCP is suggesting palliative care and she is going to send a portable x-ray to the home today to evaluate for pneumonia. She said her mother's health is declining and she is going to have her taken to a SNF because she cannot take care of her. Her husband is going in for a knee replacement tomorrow and will be recovering in a SNF. She has not  been able to weigh daily because she has been so sick but reports being compliant with her medications and she has been participating in PT but canceled today due to feeling unwell. She seems depressed and I believe she needs to speak with a counselor/therapist. After talking further with her she believes she would benefit from going to a SNF for herself. She is going to reach out to her sister "Loletha Carrow" about staying with [facility] if she going into a SNF. Her medications were verified and she had filled her own pillbox.   Time spent with patient: 65 minutes   [name], EMT 09/16/17  ACTION: Home visit completed Next visit planned for 1 week

## 2017-09-16 NOTE — Telephone Encounter (Signed)
FYI

## 2017-09-16 NOTE — Telephone Encounter (Signed)
[facility], called to get a order for a mobile chest xray for in the patients home to prevent patient having to do an ED visit.

## 2017-09-16 NOTE — ED Triage Notes (Signed)
Pt presents to ED for assessment of chest pain, SOB, influenza-like illness x 2 weeks, seen by home health.  Patient states she had an xray done today and that she was called by home health and told to come straight to the ER.  Patient states she has no idea what was wrong with the image,.  This RN does not see chest film in computer, put in for repeat scan.

## 2017-09-17 ENCOUNTER — Emergency Department (HOSPITAL_COMMUNITY)
Admission: EM | Admit: 2017-09-17 | Discharge: 2017-09-17 | Disposition: A | Discharge status: Home / Self Care | Payer: Medicare HMO | Attending: Emergency Medicine | Admitting: Emergency Medicine

## 2017-09-17 DIAGNOSIS — J40 Bronchitis, not specified as acute or chronic: Secondary | ICD-10-CM

## 2017-09-17 MED ORDER — BENZONATATE 100 MG PO CAPS: 100.0000 mg | ORAL_CAPSULE | Freq: Three times a day (TID) | ORAL | 0 refills | Status: DC

## 2017-09-17 MED ORDER — ALBUTEROL SULFATE HFA 108 (90 BASE) MCG/ACT IN AERS: 1.0000 | INHALATION_SPRAY | Freq: Four times a day (QID) | RESPIRATORY_TRACT | 0 refills | Status: DC | PRN

## 2017-09-17 MED ORDER — PREDNISONE 20 MG PO TABS
60.0000 mg | ORAL_TABLET | Freq: Once | ORAL | Status: AC
  Administered 2017-09-17: 60 mg via ORAL
  Filled 2017-09-17: qty 3

## 2017-09-17 MED ORDER — PREDNISONE 20 MG PO TABS: 20.0000 mg | ORAL_TABLET | Freq: Two times a day (BID) | ORAL | 0 refills | Status: DC

## 2017-09-17 MED ORDER — ALBUTEROL SULFATE (2.5 MG/3ML) 0.083% IN NEBU
2.5000 mg | INHALATION_SOLUTION | RESPIRATORY_TRACT | Status: DC | PRN
  Administered 2017-09-17: 2.5 mg via RESPIRATORY_TRACT
  Filled 2017-09-17: qty 3

## 2017-09-17 MED ORDER — DOXYCYCLINE HYCLATE 100 MG PO CAPS: 100.0000 mg | ORAL_CAPSULE | Freq: Two times a day (BID) | ORAL | 0 refills | Status: DC

## 2017-09-17 MED ORDER — BENZONATATE 100 MG PO CAPS
100.0000 mg | ORAL_CAPSULE | Freq: Once | ORAL | Status: AC
  Administered 2017-09-17: 100 mg via ORAL
  Filled 2017-09-17: qty 1

## 2017-09-17 NOTE — ED Notes (Signed)
ED Provider at bedside. 

## 2017-09-17 NOTE — ED Notes (Signed)
Pt refusing to leave Ed, stating she can read xrays and knows she has pneumonia. MD spoke with pt, continues to refuse to leave

## 2017-09-17 NOTE — ED Notes (Signed)
Pt voicing that is she is upset about d/c and not in agreeable to go home, She states she saw the screen on the xray machine and saw a little bit of pneumonia. Dr. Jeneen Rinks made aware.

## 2017-09-17 NOTE — Telephone Encounter (Signed)
Returned [facility] call and she stated that pt is in the hospital so she is not taking taking any order right now cause she doesn't know if she is going to be admitted or not

## 2017-09-17 NOTE — Telephone Encounter (Signed)
[facility] and spoke to Legrand Como and he will be faxing xray results over

## 2017-09-17 NOTE — Progress Notes (Signed)
Patient is well known to the HF Service through her involvement in the [facility] [place] and AHF Clinic visits.  [name] Aline- HF Community Paramedic called to make me aware that [name] was coming in to the ED after she was instructed by the [facility] after her chest Xray.  Events documented in the ED and notes reviewed.  We will continue to monitor patient through [place] after discharge as well as in AHF Clinic.  Her next AHF Clinic appt is Feb 18 at 1:40pm.

## 2017-09-17 NOTE — ED Provider Notes (Signed)
[place] EMERGENCY DEPARTMENT Provider Note   CSN: [id number] Arrival date & time: 09/16/17  1906     History   Chief Complaint Chief Complaint  Patient presents with  . Chest Pain    HPI [name] [name] is a 57 y.o. female.  Chief complaint is cough, received a phone call about x-ray.  HPI 57 year old female.  History of CHF.  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  Anticoagulated and treated via the heart failure clinic.  Has shortness of breath and fever for 5 days over "Christmas" this ended about 7 days ago.  Cough productive of occasional sputum.  Yellow-brown.  No blood.  No fevers now.  Not short of breath.  Pt had out patient PCXR at home yesterday.  She received a phone call that her x-ray showed something" was instructed to come to the emergency room.  Past Medical History:  Diagnosis Date  . Arthritis   . CHF exacerbation ([place]) 08/21/2017  . Depression   . Diverticulitis   . Hyperlipidemia   . Hypertension   . Obesity   . Pneumonia   . Pulmonary embolism [facility])     Patient Active Problem List   Diagnosis Date Noted  . CHF exacerbation ([place]) 08/21/2017  . (HFpEF) heart failure with preserved ejection fraction ([place]) 08/20/2017  . Lumbar radiculopathy 08/08/2017  . Morbid obesity with BMI of 50.0-59.9, adult ([place]) 08/08/2017  . Hoarseness 08/08/2017  . Chronic cough 04/10/2017  . Rheumatoid arthritis involving multiple sites ([place]) 04/10/2017  . Anxiety and depression 04/10/2017  . Adjustment disorder with depressed mood 12/11/2016  . Morbid obesity ([place]) 08/14/2016  . Stroke ([place]) 06/27/2016  . Right sided weakness 06/27/2016  . Essential hypertension 06/27/2016  . Atypical chest pain 06/27/2016  . Difficult airway 03/29/2016  . CTEPH (chronic thromboembolic pulmonary hypertension) ([place]) 03/14/2016  . OSA on CPAP 02/20/2016  . Chronic pain syndrome 01/31/2016  . Pulmonary embolus ([place])  11/15/2015  . Vertigo 11/15/2015  . Depression 11/15/2015    Past Surgical History:  Procedure Laterality Date  . ABDOMINAL HYSTERECTOMY    . BREAST REDUCTION SURGERY    . CHOLECYSTECTOMY    . EMBOLECTOMY N/A    pulmonary embolectomy  . REDUCTION MAMMAPLASTY Bilateral   . RIGHT HEART CATH N/A 10/28/2016   Procedure: Right Heart Cath;  Surgeon: Jolaine Artist, MD;  Location: [facility] LAB;  Service: Cardiovascular;  Laterality: N/A;  . RIGHT HEART CATH N/A 08/26/2017   Procedure: RIGHT HEART CATH;  Surgeon: Jolaine Artist, MD;  Location: [place] CV LAB;  Service: Cardiovascular;  Laterality: N/A;  . ULTRASOUND GUIDANCE FOR VASCULAR ACCESS  08/26/2017   Procedure: Ultrasound Guidance For Vascular Access;  Surgeon: Jolaine Artist, MD;  Location: [place] CV LAB;  Service: Cardiovascular;;    OB History    No data available       Home Medications    Prior to Admission medications   Medication Sig Start Date End Date Taking? Authorizing Provider  acetaminophen-codeine (TYLENOL #4) 300-60 MG tablet Take 1 tablet by mouth every 8 (eight) hours as needed for moderate pain. 09/04/17  Yes Ladell Pier, MD  amitriptyline (ELAVIL) 10 MG tablet Take 1 tablet (10 mg total) by mouth at bedtime. 06/30/17  Yes Ladell Pier, MD  atorvastatin (LIPITOR) 20 MG tablet Take 1 tablet (20 mg total) by mouth daily at 6 PM. 01/27/17  Yes Langeland, Leda Quail, MD  cyclobenzaprine (FLEXERIL) 5  MG tablet Take 1 tablet (5 mg total) by mouth 2 (two) times daily as needed. for muscle spams 08/27/17  Yes Ladell Pier, MD  docusate sodium (COLACE) 100 MG capsule Take 1 capsule (100 mg total) by mouth every 12 (twelve) hours. 10/24/16  Yes Margarita Mail, PA-C  ferrous sulfate (FERROUSUL) 325 (65 FE) MG tablet Take 1 tablet (325 mg total) by mouth daily with breakfast. 12/25/16  Yes Langeland, Dawn T, MD  furosemide (LASIX) 40 MG tablet Take 1 tablet (40 mg total) by mouth  daily. Take extra 40 mg tablet once in the afternoon AS NEEDED for weight gain 3 lbs or more. 01/27/17  Yes Langeland, Dawn T, MD  gabapentin (NEURONTIN) 300 MG capsule Take 2 capsules (600 mg total) by mouth 3 (three) times daily. 06/13/17  Yes Ladell Pier, MD  loratadine (CLARITIN) 10 MG tablet Take 1 tablet (10 mg total) by mouth daily. 03/03/17  Yes McClung, Dionne Bucy, PA-C  meclizine (ANTIVERT) 25 MG tablet TAKE ONE TABLET BY MOUTH THREE TIMES DAILY AS NEEDED FOR  DIZZINESS 06/27/17  Yes Arbutus Leas, NP  omeprazole (PRILOSEC) 20 MG capsule Take 1 capsule (20 mg total) by mouth daily. 06/13/17  Yes Ladell Pier, MD  oseltamivir (TAMIFLU) 75 MG capsule Take 1 capsule (75 mg total) by mouth 2 (two) times daily. 09/04/17  Yes Ladell Pier, MD  potassium chloride (K-DUR,KLOR-CON) 10 MEQ tablet Take 20 mEq by mouth 2 (two) times daily.   Yes [provider]  Riociguat (ADEMPAS) 1 MG TABS Take 1 mg by mouth 3 (three) times daily. 09/01/17  Yes Bensimhon, Shaune Pascal, MD  rivaroxaban (XARELTO) 20 MG TABS tablet Take 1 tablet (20 mg total) by mouth daily with supper. 01/27/17  Yes Langeland, Dawn T, MD  spironolactone (ALDACTONE) 25 MG tablet Take 0.5 tablets (12.5 mg total) by mouth daily. 08/24/17  Yes Alphonzo Grieve, MD  albuterol (PROVENTIL HFA;VENTOLIN HFA) 108 (90 Base) MCG/ACT inhaler Inhale 1-2 puffs into the lungs every 6 (six) hours as needed for wheezing. 09/17/17   Tanna Furry, MD  azithromycin (ZITHROMAX) 250 MG tablet 2 tabs PO x 1 then 1 tab daily Patient not taking: Reported on 09/16/2017 09/04/17   Ladell Pier, MD  benzonatate (TESSALON) 100 MG capsule Take 1 capsule (100 mg total) by mouth every 8 (eight) hours. 09/17/17   Tanna Furry, MD  doxycycline (VIBRAMYCIN) 100 MG capsule Take 1 capsule (100 mg total) by mouth 2 (two) times daily. 09/17/17   Tanna Furry, MD  fluticasone (FLONASE) 50 MCG/ACT nasal spray Place 2 sprays into both nostrils daily. Patient taking  differently: Place 2 sprays into both nostrils daily as needed for allergies.  02/04/17   Maren Reamer, MD  predniSONE (DELTASONE) 20 MG tablet Take 1 tablet (20 mg total) by mouth 2 (two) times daily with a meal. 09/17/17   Tanna Furry, MD    Family History Family History  Problem Relation Age of Onset  . Lung cancer Maternal Grandmother   . Dementia Mother   . Dementia Father   . Anxiety disorder Sister   . Bipolar disorder Sister   . Drug abuse Sister   . Sexual abuse Maternal Aunt   . Anxiety disorder Cousin   . Anxiety disorder Sister   . Bipolar disorder Sister   . Drug abuse Sister   . Breast cancer Neg Hx     Social History Social History   Tobacco Use  . Smoking status:  Never Smoker  . Smokeless tobacco: Never Used  Substance Use Topics  . Alcohol use: No  . Drug use: No     Allergies   [name].   Review of Systems Review of Systems  Constitutional: Positive for fever. Negative for appetite change, chills, diaphoresis and fatigue.  HENT: Negative for mouth sores, sore throat and trouble swallowing.   Eyes: Negative for visual disturbance.  Respiratory: Positive for cough and shortness of breath. Negative for chest tightness and wheezing.   Cardiovascular: Negative for chest pain.  Gastrointestinal: Negative for abdominal distention, abdominal pain, diarrhea, nausea and vomiting.  Endocrine: Negative for polydipsia, polyphagia and polyuria.  Genitourinary: Negative for dysuria, frequency and hematuria.  Musculoskeletal: Negative for gait problem.  Skin: Negative for color change, pallor and rash.  Neurological: Negative for dizziness, syncope, light-headedness and headaches.  Hematological: Does not bruise/bleed easily.  Psychiatric/Behavioral: Negative for behavioral problems and confusion.     Physical Exam Updated Vital Signs BP 125/74 (BP Location: Right Arm)   Pulse 84   Temp 98.3 F (36.8 C) (Oral)   Resp 16   SpO2  100%   Physical Exam  Constitutional: She is oriented to person, place, and time. She appears well-developed and well-nourished. No distress.  Awake and alert.  Saturating 97% on room air.  No acute distress.  Morbid obesity.  HENT:  Head: Normocephalic.  Eyes: Conjunctivae are normal. Pupils are equal, round, and reactive to light. No scleral icterus.  Neck: Normal range of motion. Neck supple. No thyromegaly present.  Cardiovascular: Normal rate and regular rhythm. Exam reveals no gallop and no friction rub.  No murmur heard. Pulmonary/Chest: Effort normal and breath sounds normal. No respiratory distress. She has no wheezes. She has no rales.  Abdominal: Soft. Bowel sounds are normal. She exhibits no distension. There is no tenderness. There is no rebound.  Musculoskeletal: Normal range of motion.  Neurological: She is alert and oriented to person, place, and time.  Skin: Skin is warm and dry. No rash noted.  Psychiatric: She has a normal mood and affect. Her behavior is normal.     ED Treatments / Results  Labs (all labs ordered are listed, but only abnormal results are displayed) Labs Reviewed  BASIC METABOLIC PANEL - Abnormal; Notable for the following components:      Result Value   Creatinine, Ser 1.06 (*)    GFR calc non Af Amer 58 (*)    All other components within normal limits  CBC - Abnormal; Notable for the following components:   RBC 5.28 (*)    Hemoglobin 10.7 (*)    HCT 34.3 (*)    MCV 65.0 (*)    MCH 20.3 (*)    RDW 17.9 (*)    All other components within normal limits  I-STAT TROPONIN, ED  I-STAT BETA HCG BLOOD, ED ([facility], [facility], [facility] ONLY)    EKG  EKG Interpretation None       Radiology Dg Chest 2 View  Result Date: 09/16/2017 CLINICAL DATA:  Chest pain. EXAM: CHEST  2 VIEW COMPARISON:  09/04/2017 FINDINGS: Right chest wall port a catheter is noted with tip at the cavoatrial junction. The heart size is mildly enlarged. Previous median sternotomy CABG  procedure. No pleural effusion or CABG procedure. Lungs are clear. IMPRESSION: 1. No active cardiopulmonary abnormalities. Electronically Signed   By: Kerby Moors M.D.   On: 09/16/2017 20:52    Procedures Procedures (including critical care time)  Medications  Ordered in ED Medications  albuterol (PROVENTIL) (2.5 MG/3ML) 0.083% nebulizer solution 2.5 mg (2.5 mg Nebulization Given 09/17/17 0907)  predniSONE (DELTASONE) tablet 60 mg (60 mg Oral Given 09/17/17 0907)  benzonatate (TESSALON) capsule 100 mg (100 mg Oral Given 09/17/17 0907)     Initial Impression / Assessment and Plan / ED Course  I have reviewed the triage vital signs and the nursing notes.  Pertinent labs & imaging results that were available during my care of the patient were reviewed by me and considered in my medical decision making (see chart for details).     Report from yesterday's x-ray showed poor visualization of the left lower lung.  Today shows no obvious infiltrate or effusion.  No obvious CHF.  Final Clinical Impressions(s) / ED Diagnoses   Final diagnoses:  Bronchitis    ED Discharge Orders        Ordered    albuterol (PROVENTIL HFA;VENTOLIN HFA) 108 (90 Base) MCG/ACT inhaler  Every 6 hours PRN     09/17/17 0931    predniSONE (DELTASONE) 20 MG tablet  2 times daily with meals     09/17/17 0931    doxycycline (VIBRAMYCIN) 100 MG capsule  2 times daily     09/17/17 0931    benzonatate (TESSALON) 100 MG capsule  Every 8 hours     09/17/17 0931       Tanna Furry, MD 09/17/17 [phone]

## 2017-09-17 NOTE — Discharge Instructions (Signed)
Use inhaler as needed every 4 hours. Tessalon for cough. Follow-up with your primary care physician if not improving before completion of medications

## 2017-09-18 ENCOUNTER — Other Ambulatory Visit: Payer: Self-pay

## 2017-09-18 ENCOUNTER — Encounter (HOSPITAL_COMMUNITY): Payer: Self-pay | Admitting: *Deleted

## 2017-09-18 ENCOUNTER — Encounter (HOSPITAL_COMMUNITY): Payer: Self-pay

## 2017-09-18 DIAGNOSIS — J9611 Chronic respiratory failure with hypoxia: Secondary | ICD-10-CM | POA: Diagnosis not present

## 2017-09-18 DIAGNOSIS — M069 Rheumatoid arthritis, unspecified: Secondary | ICD-10-CM | POA: Diagnosis not present

## 2017-09-18 DIAGNOSIS — J40 Bronchitis, not specified as acute or chronic: Secondary | ICD-10-CM | POA: Diagnosis not present

## 2017-09-18 DIAGNOSIS — I509 Heart failure, unspecified: Secondary | ICD-10-CM | POA: Insufficient documentation

## 2017-09-18 DIAGNOSIS — R0789 Other chest pain: Secondary | ICD-10-CM | POA: Diagnosis not present

## 2017-09-18 DIAGNOSIS — G4733 Obstructive sleep apnea (adult) (pediatric): Secondary | ICD-10-CM | POA: Diagnosis not present

## 2017-09-18 DIAGNOSIS — I2724 Chronic thromboembolic pulmonary hypertension: Secondary | ICD-10-CM | POA: Diagnosis not present

## 2017-09-18 DIAGNOSIS — R079 Chest pain, unspecified: Secondary | ICD-10-CM | POA: Diagnosis not present

## 2017-09-18 DIAGNOSIS — G894 Chronic pain syndrome: Secondary | ICD-10-CM | POA: Diagnosis not present

## 2017-09-18 DIAGNOSIS — Z7901 Long term (current) use of anticoagulants: Secondary | ICD-10-CM | POA: Insufficient documentation

## 2017-09-18 DIAGNOSIS — Z79899 Other long term (current) drug therapy: Secondary | ICD-10-CM | POA: Diagnosis not present

## 2017-09-18 DIAGNOSIS — R0602 Shortness of breath: Secondary | ICD-10-CM | POA: Diagnosis present

## 2017-09-18 DIAGNOSIS — I110 Hypertensive heart disease with heart failure: Secondary | ICD-10-CM | POA: Insufficient documentation

## 2017-09-18 DIAGNOSIS — F329 Major depressive disorder, single episode, unspecified: Secondary | ICD-10-CM | POA: Diagnosis not present

## 2017-09-18 DIAGNOSIS — K5793 Diverticulitis of intestine, part unspecified, without perforation or abscess with bleeding: Secondary | ICD-10-CM | POA: Diagnosis not present

## 2017-09-18 DIAGNOSIS — I10 Essential (primary) hypertension: Secondary | ICD-10-CM | POA: Diagnosis not present

## 2017-09-18 DIAGNOSIS — M1990 Unspecified osteoarthritis, unspecified site: Secondary | ICD-10-CM | POA: Diagnosis not present

## 2017-09-18 NOTE — Patient Outreach (Signed)
[facility]) Care Management  09/18/2017  [name] [name] [facility] December 24, 1960 [id number]  Subjective:  Client reports cough with chest pain, productive cough. She states going to the Emergency room on yesterday after receiving a home xray. "This is my health".  Objective: none  Assessment: RNCM called to follow up on Emergency room visit and reschedule home visit. During conversation, client was focused on her visit to the emergency room on yesterday. After allowing client to express her thoughts. RNCM encouraged client to call primary care regarding emergency room and follow up on xray report.   Client reports she has all the medications prescribed. She is calling her primary care to follow up and states she plans to go to the [facility] emergency room for follow up as instructed by someone at the hospital. [name] [name] also stated that she was looking into going to [facility] for rehabilitation. She reports the Education officer, museum with [facility] was looking into this for her.   Plan: RNCM will continue to follow.  Thea Silversmith, RN, MSN, [place] Coordinator Cell: [phone]

## 2017-09-18 NOTE — Telephone Encounter (Signed)
Pt. Called stating that she went to the ED and was told that she has bronchitis.  Pt. States that she had an x-rays done at her home. Pt. States a lady came to her home and took the x-ray. The lady told her that her PCP would receive her x-ray results. Two hours went by and someone from [facility] called her and told her to go to the ED b/c the x-rays did not look well. Pt. Can be reached at [phone]. Please f/u.

## 2017-09-18 NOTE — ED Triage Notes (Signed)
Pt c/o burning chest pain and pressure, fatigue, SOB, and sore throat. She states that she has had flu-like symptoms since New Year's. Pt is a CHF patient. Pt soul hoarse and congested in triage. She reports that she had an chest Xray at home with her home care agency. They told her that she had fluid on her lungs, a blood clot, and pneumonia. A&Ox4. Ambulaltory with cane.

## 2017-09-18 NOTE — Progress Notes (Signed)
Received medical record request from [facility].  All requested records faxed today to [phone].  Original request will be scanned to patient's electronic medical record.

## 2017-09-19 ENCOUNTER — Ambulatory Visit: Payer: Self-pay

## 2017-09-19 ENCOUNTER — Emergency Department (HOSPITAL_COMMUNITY): Payer: Medicare HMO

## 2017-09-19 ENCOUNTER — Encounter (HOSPITAL_COMMUNITY): Payer: Self-pay

## 2017-09-19 ENCOUNTER — Emergency Department (HOSPITAL_COMMUNITY)
Admission: EM | Admit: 2017-09-19 | Discharge: 2017-09-19 | Disposition: A | Discharge status: Home / Self Care | Payer: Medicare HMO | Attending: Emergency Medicine | Admitting: Emergency Medicine

## 2017-09-19 DIAGNOSIS — R0789 Other chest pain: Secondary | ICD-10-CM

## 2017-09-19 DIAGNOSIS — J40 Bronchitis, not specified as acute or chronic: Secondary | ICD-10-CM

## 2017-09-19 DIAGNOSIS — R079 Chest pain, unspecified: Secondary | ICD-10-CM | POA: Diagnosis not present

## 2017-09-19 LAB — CBC WITH DIFFERENTIAL/PLATELET
Basophils Absolute: 0 10*3/uL (ref 0.0–0.1)
Basophils Relative: 0 %
Eosinophils Absolute: 0.1 10*3/uL (ref 0.0–0.7)
Eosinophils Relative: 1 %
HCT: 33 % — ABNORMAL LOW (ref 36.0–46.0)
Hemoglobin: 10.5 g/dL — ABNORMAL LOW (ref 12.0–15.0)
Lymphocytes Relative: 34 %
Lymphs Abs: 3.1 10*3/uL (ref 0.7–4.0)
MCH: 20.5 pg — ABNORMAL LOW (ref 26.0–34.0)
MCHC: 31.8 g/dL (ref 30.0–36.0)
MCV: 64.5 fL — ABNORMAL LOW (ref 78.0–100.0)
Monocytes Absolute: 0.7 10*3/uL (ref 0.1–1.0)
Monocytes Relative: 8 %
Neutro Abs: 5.2 10*3/uL (ref 1.7–7.7)
Neutrophils Relative %: 57 %
Platelets: 374 10*3/uL (ref 150–400)
RBC: 5.12 MIL/uL — ABNORMAL HIGH (ref 3.87–5.11)
RDW: 18 % — ABNORMAL HIGH (ref 11.5–15.5)
WBC: 9.1 10*3/uL (ref 4.0–10.5)

## 2017-09-19 LAB — BASIC METABOLIC PANEL
Anion gap: 5 (ref 5–15)
BUN: 20 mg/dL (ref 6–20)
CO2: 29 mmol/L (ref 22–32)
Calcium: 9.4 mg/dL (ref 8.9–10.3)
Chloride: 108 mmol/L (ref 101–111)
Creatinine, Ser: 0.95 mg/dL (ref 0.44–1.00)
GFR calc Af Amer: >60 mL/min (ref >60)
GFR calc non Af Amer: >60 mL/min (ref >60)
Glucose, Bld: 97 mg/dL (ref 65–99)
Potassium: 3.8 mmol/L (ref 3.5–5.1)
Sodium: 142 mmol/L (ref 135–145)

## 2017-09-19 LAB — I-STAT TROPONIN, ED: Troponin i, poc: 0 ng/mL (ref 0.00–0.08)

## 2017-09-19 LAB — BRAIN NATRIURETIC PEPTIDE: B Natriuretic Peptide: 32.4 pg/mL (ref 0.0–100.0)

## 2017-09-19 MED ORDER — ONDANSETRON HCL 4 MG/2ML IJ SOLN
4.0000 mg | Freq: Once | INTRAMUSCULAR | Status: AC
  Administered 2017-09-19: 4 mg via INTRAVENOUS
  Filled 2017-09-19: qty 2

## 2017-09-19 MED ORDER — GI COCKTAIL ~~LOC~~
30.0000 mL | Freq: Once | ORAL | Status: AC
  Administered 2017-09-19: 30 mL via ORAL
  Filled 2017-09-19: qty 30

## 2017-09-19 MED ORDER — IOPAMIDOL (ISOVUE-370) INJECTION 76%
INTRAVENOUS | Status: AC
  Administered 2017-09-19: 100 mL via INTRAVENOUS
  Filled 2017-09-19: qty 100

## 2017-09-19 MED ORDER — IOPAMIDOL (ISOVUE-370) INJECTION 76%
100.0000 mL | Freq: Once | INTRAVENOUS | Status: AC | PRN
  Administered 2017-09-19: 100 mL via INTRAVENOUS

## 2017-09-19 MED ORDER — FENTANYL CITRATE (PF) 100 MCG/2ML IJ SOLN
50.0000 ug | Freq: Once | INTRAMUSCULAR | Status: AC
Start: 2017-09-19 — End: 2017-09-19
  Administered 2017-09-19: 50 ug via INTRAVENOUS
  Filled 2017-09-19: qty 2

## 2017-09-19 NOTE — ED Provider Notes (Signed)
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  States she has not been feeling well since just after Christmas.  Was seen at [facility] and wellness and had a positive flu swab.  Was started on Tamiflu.  Reports that she has had sore throat, hoarse voice, nonproductive cough that has been persistent.  Now has developed diffuse anterior chest pressure with shortness of breath.  Also feels like there is burning in her chest.  This pressure has been present for several days and has been constant.  No aggravating or relieving factors.  Was seen in the emergency department on January 8 and had a negative chest x-ray.  Was discharged on doxycycline, prednisone, albuterol and Tessalon Perles.  States she feels like she is not getting any better.  Came here to make sure that she did not have any other acute condition causing her symptoms.  States she was worried about her heart.  ROS: See HPI Constitutional: no fever  Eyes: no drainage  ENT: no runny nose   Cardiovascular:  no chest pain  Resp: no SOB  GI: no vomiting GU: no dysuria Integumentary: no rash  Allergy: no hives  Musculoskeletal: no leg swelling  Neurological: no slurred speech ROS otherwise negative  PAST MEDICAL HISTORY/PAST SURGICAL HISTORY:  Past Medical History:  Diagnosis Date  . Arthritis   . CHF exacerbation ([place]) 08/21/2017  . Depression   . Diverticulitis   . Hyperlipidemia   . Hypertension   . Obesity   . Pneumonia   . Pulmonary embolism (HCC)     MEDICATIONS:  Prior to Admission medications   Medication Sig Start Date End Date Taking? Authorizing Provider  acetaminophen-codeine (TYLENOL #4) 300-60 MG tablet Take 1 tablet by mouth every 8  (eight) hours as needed for moderate pain. 09/04/17  Yes Ladell Pier, MD  albuterol (PROVENTIL HFA;VENTOLIN HFA) 108 (90 Base) MCG/ACT inhaler Inhale 1-2 puffs into the lungs every 6 (six) hours as needed for wheezing. 09/17/17  Yes Tanna Furry, MD  amitriptyline (ELAVIL) 10 MG tablet Take 1 tablet (10 mg total) by mouth at bedtime. 06/30/17  Yes Ladell Pier, MD  atorvastatin (LIPITOR) 20 MG tablet Take 1 tablet (20 mg total) by mouth daily at 6 PM. 01/27/17  Yes Langeland, Dawn T, MD  benzonatate (TESSALON) 100 MG capsule Take 1 capsule (100 mg total) by mouth every 8 (eight) hours. 09/17/17  Yes Tanna Furry, MD  cyclobenzaprine (FLEXERIL) 5 MG tablet Take 1 tablet (5 mg total) by mouth 2 (two) times daily as needed. for muscle spams 08/27/17  Yes Ladell Pier, MD  docusate sodium (COLACE) 100 MG capsule Take 1 capsule (100 mg total) by mouth every 12 (twelve) hours. 10/24/16  Yes Harris, Abigail, PA-C  doxycycline (VIBRAMYCIN) 100 MG capsule Take 1 capsule (100 mg total) by mouth 2 (two) times daily. 09/17/17  Yes Tanna Furry, MD  ferrous sulfate (FERROUSUL) 325 (65 FE) MG tablet Take 1 tablet (325 mg total) by mouth daily with breakfast. 12/25/16  Yes Langeland, Dawn T, MD  fluticasone (FLONASE) 50 MCG/ACT nasal spray Place 2 sprays into both nostrils daily. Patient taking differently: Place 2 sprays into both nostrils daily as needed for allergies.  02/04/17  Yes Maren Reamer, MD  furosemide (LASIX) 40 MG tablet Take 1 tablet (40 mg total) by mouth daily. Take extra 40 mg tablet once in the afternoon AS NEEDED for weight gain 3 lbs or more. 01/27/17  Yes Langeland, Dawn T, MD  gabapentin (NEURONTIN) 300 MG capsule Take 2 capsules (600 mg total) by mouth 3 (three) times daily. 06/13/17  Yes Ladell Pier, MD  loratadine (CLARITIN) 10 MG tablet Take 1 tablet (10 mg total) by mouth daily. 03/03/17  Yes McClung, Dionne Bucy, PA-C  meclizine (ANTIVERT) 25 MG tablet TAKE ONE TABLET BY MOUTH  THREE TIMES DAILY AS NEEDED FOR  DIZZINESS 06/27/17  Yes Arbutus Leas, NP  omeprazole (PRILOSEC) 20 MG capsule Take 1 capsule (20 mg total) by mouth daily. 06/13/17  Yes Ladell Pier, MD  potassium chloride (K-DUR,KLOR-CON) 10 MEQ tablet Take 20 mEq by mouth 2 (two) times daily.   Yes [provider]  predniSONE (DELTASONE) 20 MG tablet Take 1 tablet (20 mg total) by mouth 2 (two) times daily with a meal. 09/17/17  Yes Tanna Furry, MD  Riociguat (ADEMPAS) 1 MG TABS Take 1 mg by mouth 3 (three) times daily. 09/01/17  Yes Bensimhon, Shaune Pascal, MD  rivaroxaban (XARELTO) 20 MG TABS tablet Take 1 tablet (20 mg total) by mouth daily with supper. 01/27/17  Yes Langeland, Dawn T, MD  spironolactone (ALDACTONE) 25 MG tablet Take 0.5 tablets (12.5 mg total) by mouth daily. 08/24/17  Yes Alphonzo Grieve, MD  oseltamivir (TAMIFLU) 75 MG capsule Take 1 capsule (75 mg total) by mouth 2 (two) times daily. Patient not taking: Reported on 09/19/2017 09/04/17   Ladell Pier, MD    ALLERGIES:  No Known Allergies  SOCIAL HISTORY:  Social History   Tobacco Use  . Smoking status: Never Smoker  . Smokeless tobacco: Never Used  Substance Use Topics  . Alcohol use: No    FAMILY HISTORY: Family History  Problem Relation Age of Onset  . Lung cancer Maternal Grandmother   . Dementia Mother   . Dementia Father   . Anxiety disorder Sister   . Bipolar disorder Sister   . Drug abuse Sister   . Sexual abuse Maternal Aunt   . Anxiety disorder Cousin   . Anxiety disorder Sister   . Bipolar disorder Sister   . Drug abuse Sister   . Breast cancer Neg Hx     EXAM: BP 139/77   Pulse (!) 102   Temp 98.4 F (36.9 C) (Oral)   Resp (!) 21   Ht 5\' 3"  (1.6 m)   Wt (!) 149.7 kg (330 lb)   SpO2 97%   BMI 58.46 kg/m  CONSTITUTIONAL: Alert and oriented and responds appropriately to questions. Well-appearing; well-nourished, morbidly obese HEAD: Normocephalic EYES: Conjunctivae clear, pupils  appear equal, EOMI ENT: normal nose; moist mucous membranes; No pharyngeal erythema or petechiae, no tonsillar hypertrophy or exudate, no uvular deviation, no unilateral swelling, no trismus or drooling, no muffled voice, slightly hoarse voice, no stridor, no dental caries present, no drainable dental abscess noted, no Ludwig's angina, tongue sits flat in the bottom of the mouth, no angioedema, no facial erythema or warmth, no facial swelling; no pain with movement of the neck. NECK: Supple, no meningismus, no nuchal rigidity, no LAD CARD: RRR; S1 and S2 appreciated; no murmurs, no clicks, no rubs, no gallops RESP: Normal chest excursion without splinting or tachypnea; breath sounds clear and equal bilaterally; no wheezes, no rhonchi, no rales, no hypoxia or respiratory distress,  speaking full sentences ABD/GI: Normal bowel sounds; non-distended; soft, non-tender, no rebound, no guarding, no peritoneal signs, no hepatosplenomegaly BACK:  The back appears normal and is non-tender to palpation, there is no CVA tenderness EXT: Normal ROM in all joints; non-tender to palpation; no edema; normal capillary refill; no cyanosis, no calf tenderness or swelling    SKIN: Normal color for age and race; warm; no rash NEURO: Moves all extremities equally PSYCH: The patient's mood and manner are appropriate. Grooming and personal hygiene are appropriate.  MEDICAL DECISION MAKING: Patient here with chest pain and shortness of breath.  It seems like this is more likely related to recent viral illness and I agree with recent providers diagnosis of bronchitis.  She does however have many risk factors for ACS and PE.  Does not appears significantly volume overloaded at this time but is morbidly obese which limits my evaluation.  Given she reports worsening symptoms and no improvement, will obtain a CT of her chest to ensure there is no PE, edema that we are not see on chest x-ray.  We will also obtain cardiac labs.  EKG  shows right bundle branch block that is similar compared to previous.  No new ischemic changes.  ED PROGRESS: Patient denies any significant improvement after GI cocktail.  Will give fentanyl.  She appears to be having spasms in her chest from coughing.  Labs, CT scan pending.   Patient's troponin is negative.  BNP is normal at 32.  No leukocytosis.  Stable hemoglobin.  CTA shows no PE, edema, pneumonia.  No acute change.  She has no hypoxia here.  I agree that this is likely bronchitis and have advised her to continue her medications that were prescribed when she was here on January 9.  She is comfortable with this plan.  Given chest pain constant for several days I do not think she needs serial troponins.  Again low suspicion for ACS.  She has a PCP and cardiologist for outpatient follow-up as needed.   At this time, I do not feel there is any life-threatening condition present. I have reviewed and discussed all results (EKG, imaging, lab, urine as appropriate) and exam findings with patient/family. I have reviewed nursing notes and appropriate previous records.  I feel the patient is safe to be discharged home without further emergent workup and can continue workup as an outpatient as needed. Discussed usual and customary return precautions. Patient/family verbalize understanding and are comfortable with this plan.  Outpatient follow-up has been provided if needed. All questions have been answered.   EKG Interpretation  Date/Time:  Thursday September 18 2017 22:18:39 EST Ventricular Rate:  96 PR Interval:    QRS Duration: 130 QT Interval:  375 QTC Calculation: 474 R Axis:   10 Text Interpretation:  Sinus rhythm IVCD, consider atypical RBBB No significant change since last tracing Confirmed by Josee Speece, Cyril Mourning [phone]) on 09/19/2017 3:43:51 AM          Rebeca Valdivia, Delice Bison, DO 09/19/17 [id number]

## 2017-09-19 NOTE — ED Notes (Signed)
Pt does not have a power port and is a hard stick. IV team consulted for an IV and blood draw for CT angio.

## 2017-09-19 NOTE — ED Notes (Signed)
Pt reports that she does not want repeat blood drawn or a new xray at this time since she had it done yesterday.

## 2017-09-19 NOTE — Telephone Encounter (Signed)
Returned pt call and asked pt to give me a call at her earliest convienence

## 2017-09-19 NOTE — Telephone Encounter (Signed)
Pt states she is wanting to know why did advanced home care xray show something saying she needs to go the hospital. Pt states she had a CT a done but it was normal per patient. Pt is requesting a call back from Dr. [name] when she has chance

## 2017-09-19 NOTE — Discharge Instructions (Signed)
Your labs today were normal.  No sign of heart attack.  Your CT scan showed no fluid on your lungs, pneumonia or new blood clots.  Please continue your medications as prescribed.  Please follow-up with your primary care physician if symptoms are not improving in 1 week.

## 2017-09-19 NOTE — ED Notes (Signed)
Unable to collect labs patient wants her port access 

## 2017-09-20 DIAGNOSIS — B349 Viral infection, unspecified: Secondary | ICD-10-CM | POA: Diagnosis not present

## 2017-09-20 DIAGNOSIS — J189 Pneumonia, unspecified organism: Secondary | ICD-10-CM | POA: Diagnosis not present

## 2017-09-20 DIAGNOSIS — G4730 Sleep apnea, unspecified: Secondary | ICD-10-CM | POA: Diagnosis not present

## 2017-09-20 DIAGNOSIS — J209 Acute bronchitis, unspecified: Secondary | ICD-10-CM | POA: Diagnosis not present

## 2017-09-20 DIAGNOSIS — E785 Hyperlipidemia, unspecified: Secondary | ICD-10-CM | POA: Diagnosis not present

## 2017-09-20 DIAGNOSIS — E7849 Other hyperlipidemia: Secondary | ICD-10-CM | POA: Diagnosis not present

## 2017-09-20 DIAGNOSIS — I2782 Chronic pulmonary embolism: Secondary | ICD-10-CM | POA: Diagnosis not present

## 2017-09-20 DIAGNOSIS — G894 Chronic pain syndrome: Secondary | ICD-10-CM | POA: Diagnosis not present

## 2017-09-20 DIAGNOSIS — I5030 Unspecified diastolic (congestive) heart failure: Secondary | ICD-10-CM | POA: Diagnosis not present

## 2017-09-20 DIAGNOSIS — F419 Anxiety disorder, unspecified: Secondary | ICD-10-CM | POA: Diagnosis not present

## 2017-09-20 DIAGNOSIS — R69 Illness, unspecified: Secondary | ICD-10-CM | POA: Diagnosis not present

## 2017-09-20 DIAGNOSIS — R0981 Nasal congestion: Secondary | ICD-10-CM | POA: Diagnosis not present

## 2017-09-20 DIAGNOSIS — I10 Essential (primary) hypertension: Secondary | ICD-10-CM | POA: Diagnosis not present

## 2017-09-20 DIAGNOSIS — F5105 Insomnia due to other mental disorder: Secondary | ICD-10-CM | POA: Diagnosis not present

## 2017-09-20 DIAGNOSIS — J40 Bronchitis, not specified as acute or chronic: Secondary | ICD-10-CM | POA: Diagnosis not present

## 2017-09-20 DIAGNOSIS — E669 Obesity, unspecified: Secondary | ICD-10-CM | POA: Diagnosis not present

## 2017-09-20 DIAGNOSIS — R079 Chest pain, unspecified: Secondary | ICD-10-CM | POA: Diagnosis not present

## 2017-09-20 DIAGNOSIS — I272 Pulmonary hypertension, unspecified: Secondary | ICD-10-CM | POA: Diagnosis not present

## 2017-09-20 DIAGNOSIS — F339 Major depressive disorder, recurrent, unspecified: Secondary | ICD-10-CM | POA: Diagnosis not present

## 2017-09-20 DIAGNOSIS — M069 Rheumatoid arthritis, unspecified: Secondary | ICD-10-CM | POA: Diagnosis not present

## 2017-09-20 DIAGNOSIS — R0989 Other specified symptoms and signs involving the circulatory and respiratory systems: Secondary | ICD-10-CM | POA: Diagnosis not present

## 2017-09-20 DIAGNOSIS — R05 Cough: Secondary | ICD-10-CM | POA: Diagnosis not present

## 2017-09-20 DIAGNOSIS — R918 Other nonspecific abnormal finding of lung field: Secondary | ICD-10-CM | POA: Diagnosis not present

## 2017-09-20 DIAGNOSIS — G4733 Obstructive sleep apnea (adult) (pediatric): Secondary | ICD-10-CM | POA: Diagnosis not present

## 2017-09-22 ENCOUNTER — Ambulatory Visit: Payer: Self-pay | Admitting: Pulmonary Disease

## 2017-09-22 ENCOUNTER — Other Ambulatory Visit: Payer: Self-pay

## 2017-09-22 DIAGNOSIS — R69 Illness, unspecified: Secondary | ICD-10-CM | POA: Diagnosis not present

## 2017-09-22 DIAGNOSIS — J209 Acute bronchitis, unspecified: Secondary | ICD-10-CM | POA: Diagnosis not present

## 2017-09-22 DIAGNOSIS — R05 Cough: Secondary | ICD-10-CM | POA: Diagnosis not present

## 2017-09-22 DIAGNOSIS — R0981 Nasal congestion: Secondary | ICD-10-CM | POA: Diagnosis not present

## 2017-09-22 NOTE — Patient Outreach (Signed)
[place] [facility]) Care Management  09/22/2017  [name] [name] [facility] [birth year] [id number]    Assessment: 57 year old with multiple co-morbidities. History of pulmonary embolus; OSA; chronic thromboembolic pulmonary hypertension; HTN, anxiety, depression, morbid obesity, chronic pain syndrome, stroke, congestive heart failure, adjustment disorder with depressed mood.  RNCM received voice message from client stating that she is at [facility] for rehabilitation for a couple of weeks.  Plan: Client will be in facility greater than 10 days. RNCM will close this encounter and will place social work referral for transition of care assistance during skilled stay. Referral to [facility] management will need to be placed pending discharge date.   Thea Silversmith, RN, MSN, [place] Coordinator Cell: [phone]

## 2017-09-22 NOTE — Patient Outreach (Signed)
[facility]) Care Management  09/22/2017  Jyoti Harju [facility] 08/05/1961 [id number]   Assessment: 57 year old with multiple co-morbidities. History of pulmonary embolus; OSA; chronic thromboembolic pulmonary hypertension; HTN, anxiety, depression, morbid obesity, chronic pain syndrome, stroke, congestive heart failure, adjustment disorder with depressed mood.  RNCM called to follow up-ED visit noted 09/19/17 with diagnosis of Bronchitis. Client is not available to talk at this time. HIPPA compliant message left.  Plan: await return call. Continue to attempt to reach client.   Thea Silversmith, RN, MSN, [place] Coordinator Cell: [phone]

## 2017-09-23 DIAGNOSIS — I10 Essential (primary) hypertension: Secondary | ICD-10-CM | POA: Diagnosis not present

## 2017-09-23 DIAGNOSIS — I272 Pulmonary hypertension, unspecified: Secondary | ICD-10-CM | POA: Diagnosis not present

## 2017-09-23 DIAGNOSIS — J209 Acute bronchitis, unspecified: Secondary | ICD-10-CM | POA: Diagnosis not present

## 2017-09-23 DIAGNOSIS — E785 Hyperlipidemia, unspecified: Secondary | ICD-10-CM | POA: Diagnosis not present

## 2017-09-24 ENCOUNTER — Encounter: Payer: Self-pay | Admitting: *Deleted

## 2017-09-24 ENCOUNTER — Other Ambulatory Visit: Payer: Self-pay

## 2017-09-24 DIAGNOSIS — R0981 Nasal congestion: Secondary | ICD-10-CM | POA: Diagnosis not present

## 2017-09-24 DIAGNOSIS — B349 Viral infection, unspecified: Secondary | ICD-10-CM | POA: Diagnosis not present

## 2017-09-24 DIAGNOSIS — R05 Cough: Secondary | ICD-10-CM | POA: Diagnosis not present

## 2017-09-24 NOTE — Patient Outreach (Signed)
[facility]) Care Management  09/24/2017  [name] [facility] [birth year] [id number]   RNCM returned call to client and thanked her for leaving the message that she is now at [facility] for rehabilitation. RNCM informed her that [place] work is being referred to follow up with her.  Plan: [facility] social work referral completed.  Thea Silversmith, RN, MSN, [place] Coordinator Cell: [phone]

## 2017-09-24 NOTE — Telephone Encounter (Signed)
PC returned to pt today. Left message on VM that I received her message from my nurse.  I did look at the [facility] reports from [facility] and from her recent ER visit. Not sure why the discrepancies in the readies but they were read by different individuals.  Obviously [facility] has more previous CXR films to compare to.  Hope she is feeling better.

## 2017-09-25 ENCOUNTER — Encounter: Payer: Self-pay | Admitting: *Deleted

## 2017-09-25 ENCOUNTER — Other Ambulatory Visit: Payer: Self-pay | Admitting: Internal Medicine

## 2017-09-25 ENCOUNTER — Other Ambulatory Visit: Payer: Self-pay | Admitting: *Deleted

## 2017-09-25 ENCOUNTER — Encounter (HOSPITAL_COMMUNITY): Payer: Medicare HMO

## 2017-09-25 DIAGNOSIS — R918 Other nonspecific abnormal finding of lung field: Secondary | ICD-10-CM

## 2017-09-25 NOTE — Patient Outreach (Signed)
[place] [facility]) Care Management  09/25/2017  Wrenn Willcox [facility] Aug 09, 1961 [id number]   CSW was able to make contact with patient today to perform the initial assessment, as well as assess and assist with social work needs and services, when [place] met with patient at [facility] where patient currently resides to receive short-term rehabilitative services.  CSW introduced self, explained role and types of services provided through [place] [facility] ([place] [facility]).  CSW further explained to patient that CSW works with patient's RNCM, also with [place] [facility], Thea Silversmith. CSW then explained the reason for the visit, indicating that Ms. Juleen China thought that patient would benefit from social work services and resources to assist with discharge planning needs and services from the skilled nursing facility.  CSW obtained two HIPAA compliant identifiers from patient, which included patient's name and date of birth. Patient reported that she was placed at [facility] by her Social Worker with [place].  Patient felt that she needed rehabilitative services after being sick for several weeks because she became so weak and fatigued.  Patient anticipates being at the skilled facility for at least two weeks, but is hoping that her insurance will approve her for a longer length of stay.  Patient endorses that her plans are to return home to live with her husband, Chenae Brager at time of discharge from the skilled facility.  CSW agreed to follow-up with patient in two weeks to assess and assist with discharge planning needs and services.  [facility] CM Care Plan Problem One     Most Recent Value  Care Plan Problem One  Level of care issues.  Role Documenting the Problem One  Clinical Social Worker  Care Plan for Problem One  Active  [facility] Long Term Goal   Patient will have home health services and durable  medical equipment in place prior to discharge from the skilled nursing facility, within the next 45 days.  [facility] Long Term Goal Start Date  09/25/17  Interventions for Problem One Long Term Goal  CSW will assist patient and discharge planning coordinator at the skilled facility with arranging discharge plans for patient.  [facility] CM Short Term Goal #1   Patient will decide on a home health agency of choice, within the next three weeks.  [facility] CM Short Term Goal #1 Start Date  09/25/17  Interventions for Short Term Goal #1  CSW will provide patient with a list of home health agencies.  THN CM Short Term Goal #2   Patient will decide on an agency of choice, with regards to durable medical equipment, within the next three weeks.  [facility]N CM Short Term Goal #2 Start Date  09/25/17  Interventions for Short Term Goal #2  CSW will provide patient with a list of agencies offering durable medical equipment.    Nat Christen, BSW, MSW, LCSW  Licensed Education officer, [facility]  Mailing [place] [address] [place], [place] [address] Physical Address-[address], [place], [place] [address] Toll Free Main # [phone] Fax # [phone] Cell # [phone]  Office # [phone] [email or web address]

## 2017-09-26 ENCOUNTER — Ambulatory Visit
Admission: RE | Admit: 2017-09-26 | Discharge: 2017-09-26 | Disposition: A | Discharge status: Home / Self Care | Payer: Medicare HMO | Source: Ambulatory Visit | Attending: Internal Medicine | Admitting: Internal Medicine

## 2017-09-26 DIAGNOSIS — R918 Other nonspecific abnormal finding of lung field: Secondary | ICD-10-CM | POA: Diagnosis not present

## 2017-09-29 DIAGNOSIS — G4730 Sleep apnea, unspecified: Secondary | ICD-10-CM | POA: Diagnosis not present

## 2017-09-29 DIAGNOSIS — F5105 Insomnia due to other mental disorder: Secondary | ICD-10-CM | POA: Diagnosis not present

## 2017-09-29 DIAGNOSIS — F419 Anxiety disorder, unspecified: Secondary | ICD-10-CM | POA: Diagnosis not present

## 2017-09-30 DIAGNOSIS — J189 Pneumonia, unspecified organism: Secondary | ICD-10-CM | POA: Diagnosis not present

## 2017-09-30 DIAGNOSIS — R0981 Nasal congestion: Secondary | ICD-10-CM | POA: Diagnosis not present

## 2017-09-30 DIAGNOSIS — R079 Chest pain, unspecified: Secondary | ICD-10-CM | POA: Diagnosis not present

## 2017-09-30 DIAGNOSIS — R05 Cough: Secondary | ICD-10-CM | POA: Diagnosis not present

## 2017-10-01 DIAGNOSIS — I10 Essential (primary) hypertension: Secondary | ICD-10-CM | POA: Diagnosis not present

## 2017-10-01 DIAGNOSIS — F339 Major depressive disorder, recurrent, unspecified: Secondary | ICD-10-CM | POA: Diagnosis not present

## 2017-10-01 DIAGNOSIS — G4733 Obstructive sleep apnea (adult) (pediatric): Secondary | ICD-10-CM | POA: Diagnosis not present

## 2017-10-01 DIAGNOSIS — E669 Obesity, unspecified: Secondary | ICD-10-CM | POA: Diagnosis not present

## 2017-10-01 DIAGNOSIS — J40 Bronchitis, not specified as acute or chronic: Secondary | ICD-10-CM | POA: Diagnosis not present

## 2017-10-01 DIAGNOSIS — F419 Anxiety disorder, unspecified: Secondary | ICD-10-CM | POA: Diagnosis not present

## 2017-10-01 DIAGNOSIS — M069 Rheumatoid arthritis, unspecified: Secondary | ICD-10-CM | POA: Diagnosis not present

## 2017-10-01 DIAGNOSIS — I5030 Unspecified diastolic (congestive) heart failure: Secondary | ICD-10-CM | POA: Diagnosis not present

## 2017-10-01 DIAGNOSIS — G894 Chronic pain syndrome: Secondary | ICD-10-CM | POA: Diagnosis not present

## 2017-10-02 ENCOUNTER — Telehealth: Payer: Self-pay | Admitting: Internal Medicine

## 2017-10-02 NOTE — Telephone Encounter (Signed)
Error

## 2017-10-03 ENCOUNTER — Other Ambulatory Visit: Payer: Self-pay | Admitting: *Deleted

## 2017-10-03 NOTE — Patient Outreach (Signed)
Call from [place], [place] at facility.  She reports that patient is going home today and therapy recommends outpatient therapy.  She is requesting transportation.  RNCM reminded SW that patient has transportation benefit through her Medicare plan.  Requested that SW have patient call number on card to get application and process started for transportation, she agrees.  Plan to update [facility] care team of patient discharge and transportation issues for any needed follow up. Royetta Crochet. Laymond Purser, RN, BSN, [place] [phone]) Business Cell  [phone]) Toll Free Office

## 2017-10-06 ENCOUNTER — Other Ambulatory Visit: Payer: Self-pay | Admitting: *Deleted

## 2017-10-06 NOTE — Patient Outreach (Signed)
[place] [facility]) Care Management  10/06/2017  Khalani Novoa Oct 01, 1960 [id number]    CSW spoke with patient today who reports she was released on Friday from SNF. She is awaiting [place] services (she anticipates RN and PT) and indicates she has not been able to get her RX's filled yet.   CSW has placed a referral for [facility] RN and RPH to outreach patient to assist with her transition to home as well as for needs (CHF, RX, etc)  Patient is aware of her SCAT and [employer] transportation benefits. CSW will plan f/u call later this week for further assessment of CSW needs.   Eduard Clos, MSW, [place] Worker  [place] [phone]

## 2017-10-08 ENCOUNTER — Other Ambulatory Visit: Payer: Self-pay | Admitting: *Deleted

## 2017-10-08 ENCOUNTER — Other Ambulatory Visit (HOSPITAL_COMMUNITY): Payer: Self-pay

## 2017-10-08 ENCOUNTER — Ambulatory Visit: Payer: Self-pay | Admitting: *Deleted

## 2017-10-08 NOTE — Patient Outreach (Signed)
[place] [facility]) Care Management  10/08/2017  Tamula Morrical [facility] 1960-09-14 [id number]   Referral received from LCSW as member was recently discharged from SNF/rehab on 1/25.  Per chart, she has history of pulmonary hypertension, essential hypertension, heart failure, sleep apnea, anxiety, depression, and stroke.  Call placed to member, identity confirmed.  This care manager introduced self and purpose of call.  She is familiar with [facility] services as she has been active in the past.  State that she had a call today from someone with [facility] but can't remember name, state they will call her back later today.  She prefer to wait for call back before "going over all this again."  She is active with paramedicine, EMT will make home visit today.  She agrees to have this care manager call back within the next 2 days to follow up and establish care.    Valente David, [place], MSN [place] [phone]

## 2017-10-08 NOTE — Progress Notes (Signed)
Paramedicine Encounter    Patient ID: [name] [name], female    DOB: [birth year], 57 y.o.   MRN: [id number]   Patient Care Team: Ladell Pier, MD as PCP - General (Internal Medicine) [place], Maree Erie, LCSW as [place] Management Valente David, RN as [place], [place], [facility] as [place] (Pharmacist)  Patient Active Problem List   Diagnosis Date Noted  . CHF exacerbation ([place]) 08/21/2017  . (HFpEF) heart failure with preserved ejection fraction ([place]) 08/20/2017  . Lumbar radiculopathy 08/08/2017  . Morbid obesity with BMI of 50.0-59.9, adult ([place]) 08/08/2017  . Hoarseness 08/08/2017  . Chronic cough 04/10/2017  . Rheumatoid arthritis involving multiple sites ([place]) 04/10/2017  . Anxiety and depression 04/10/2017  . Adjustment disorder with depressed mood 12/11/2016  . Morbid obesity ([place]) 08/14/2016  . Stroke ([place]) 06/27/2016  . Right sided weakness 06/27/2016  . Essential hypertension 06/27/2016  . Atypical chest pain 06/27/2016  . Difficult airway 03/29/2016  . CTEPH (chronic thromboembolic pulmonary hypertension) ([place]) 03/14/2016  . OSA on CPAP 02/20/2016  . Chronic pain syndrome 01/31/2016  . Pulmonary embolus ([place]) 11/15/2015  . Vertigo 11/15/2015  . Depression 11/15/2015    Current Outpatient Medications:  .  acetaminophen-codeine (TYLENOL #4) 300-60 MG tablet, Take 1 tablet by mouth every 8 (eight) hours as needed for moderate pain., Disp: 70 tablet, Rfl: 1 .  albuterol (PROVENTIL HFA;VENTOLIN HFA) 108 (90 Base) MCG/ACT inhaler, Inhale 1-2 puffs into the lungs every 6 (six) hours as needed for wheezing., Disp: 1 Inhaler, Rfl: 0 .  amitriptyline (ELAVIL) 10 MG tablet, Take 1 tablet (10 mg total) by mouth at bedtime., Disp: 30 tablet, Rfl: 2 .  atorvastatin (LIPITOR) 20 MG tablet, Take 1 tablet (20 mg total) by mouth daily at 6 PM., Disp: 90 tablet, Rfl: 2 .  benzonatate  (TESSALON) 100 MG capsule, Take 1 capsule (100 mg total) by mouth every 8 (eight) hours., Disp: 21 capsule, Rfl: 0 .  cyclobenzaprine (FLEXERIL) 5 MG tablet, Take 1 tablet (5 mg total) by mouth 2 (two) times daily as needed. for muscle spams, Disp: 40 tablet, Rfl: 1 .  docusate sodium (COLACE) 100 MG capsule, Take 1 capsule (100 mg total) by mouth every 12 (twelve) hours., Disp: 30 capsule, Rfl: 0 .  doxycycline (VIBRAMYCIN) 100 MG capsule, Take 1 capsule (100 mg total) by mouth 2 (two) times daily., Disp: 20 capsule, Rfl: 0 .  ferrous sulfate (FERROUSUL) 325 (65 FE) MG tablet, Take 1 tablet (325 mg total) by mouth daily with breakfast., Disp: 90 tablet, Rfl: 3 .  fluticasone (FLONASE) 50 MCG/ACT nasal spray, Place 2 sprays into both nostrils daily. (Patient taking differently: Place 2 sprays into both nostrils daily as needed for allergies. ), Disp: 16 g, Rfl: 6 .  furosemide (LASIX) 40 MG tablet, Take 1 tablet (40 mg total) by mouth daily. Take extra 40 mg tablet once in the afternoon AS NEEDED for weight gain 3 lbs or more., Disp: 60 tablet, Rfl: 3 .  gabapentin (NEURONTIN) 300 MG capsule, Take 2 capsules (600 mg total) by mouth 3 (three) times daily., Disp: 180 capsule, Rfl: 3 .  loratadine (CLARITIN) 10 MG tablet, Take 1 tablet (10 mg total) by mouth daily., Disp: 30 tablet, Rfl: 11 .  meclizine (ANTIVERT) 25 MG tablet, TAKE ONE TABLET BY MOUTH THREE TIMES DAILY AS NEEDED FOR  DIZZINESS, Disp: 60 tablet, Rfl: 0 .  omeprazole (PRILOSEC) 20 MG capsule,  Take 1 capsule (20 mg total) by mouth daily., Disp: 30 capsule, Rfl: 3 .  potassium chloride (K-DUR,KLOR-CON) 10 MEQ tablet, Take 20 mEq by mouth 2 (two) times daily., Disp: , Rfl:  .  Riociguat (ADEMPAS) 1 MG TABS, Take 1 mg by mouth 3 (three) times daily., Disp: 90 tablet, Rfl: 5 .  rivaroxaban (XARELTO) 20 MG TABS tablet, Take 1 tablet (20 mg total) by mouth daily with supper., Disp: 90 tablet, Rfl: 3 .  spironolactone (ALDACTONE) 25 MG tablet,  Take 0.5 tablets (12.5 mg total) by mouth daily., Disp: 30 tablet, Rfl: 5 .  predniSONE (DELTASONE) 20 MG tablet, Take 1 tablet (20 mg total) by mouth 2 (two) times daily with a meal. (Patient not taking: Reported on 10/08/2017), Disp: 10 tablet, Rfl: 0  Current Facility-Administered Medications:  .  heparin flush 10 UNIT/ML injection 5,000 Units, 500 mL, Intracatheter, Q30 days, Ladell Pier, MD No Known Allergies    Social History   Socioeconomic History  . Marital status: Married    Spouse name: Not on file  . Number of children: Not on file  . Years of education: Not on file  . Highest education level: Not on file  Social Needs  . Financial resource strain: Not on file  . Food insecurity - worry: Not on file  . Food insecurity - inability: Not on file  . Transportation needs - medical: Not on file  . Transportation needs - non-medical: Not on file  Occupational History  . Not on file  Tobacco Use  . Smoking status: Never Smoker  . Smokeless tobacco: Never Used  Substance and Sexual Activity  . Alcohol use: No  . Drug use: No  . Sexual activity: Yes    Partners: Male    Birth control/protection: Surgical  Other Topics Concern  . Not on file  Social History Narrative  . Not on file    Physical Exam  Constitutional: She is oriented to person, place, and time.  Cardiovascular: Normal rate and regular rhythm.  Pulmonary/Chest: Effort normal and breath sounds normal. No respiratory distress. She has no wheezes. She has no rales.  Abdominal: Soft.  Musculoskeletal: Normal range of motion. She exhibits edema.  Neurological: She is alert and oriented to person, place, and time.  Skin: Skin is warm and dry.  Psychiatric: She has a normal mood and affect.        Future Appointments  Date Time Provider [place]  10/13/2017  2:30 PM Ladell Pier, MD CHW-CHWW None  10/20/2017 10:45 AM Ladell Pier, MD CHW-CHWW None  10/27/2017  8:00 AM [facility]  ROOM WL-MDCC None  10/27/2017  1:40 PM Bensimhon, Shaune Pascal, MD MC-HVSC None  11/07/2017 11:15 AM Ladell Pier, MD CHW-CHWW None  11/24/2017 11:00 AM WL-MDCC ROOM WL-MDCC None  12/26/2017 11:00 AM WL-MDCC ROOM WL-MDCC None    BP 110/64 (BP Location: Left Arm, Patient Position: Sitting, Cuff Size: Large)   Pulse 93   Resp 20   Wt (!) 332 lb (150.6 kg)   SpO2 97%   BMI 58.81 kg/m   Weight yesterday- 336 lb Last visit weight- 336 lb  [name] [name] was seen at home today and reported feeling better since coming home from the SNF. She complained of dizziness and SOB with exertion. These complaints are in line with those she had before being in the hospital and SNF. Her medications were verified and her pillbox was refilled. She is scheduled to restart PT tomorrow via  [facility]. I have her prescriptions for omeprazole and amitriptyline and will be taking them to the pharmacy in the morning.   Time spent with patient: 47 minutes  [name], EMT 10/08/17  ACTION: Home visit completed Next visit planned for 1 week

## 2017-10-09 ENCOUNTER — Telehealth: Payer: Self-pay | Admitting: Internal Medicine

## 2017-10-09 ENCOUNTER — Other Ambulatory Visit: Payer: Self-pay | Admitting: *Deleted

## 2017-10-09 DIAGNOSIS — I10 Essential (primary) hypertension: Secondary | ICD-10-CM | POA: Diagnosis not present

## 2017-10-09 DIAGNOSIS — M069 Rheumatoid arthritis, unspecified: Secondary | ICD-10-CM | POA: Diagnosis not present

## 2017-10-09 DIAGNOSIS — K5793 Diverticulitis of intestine, part unspecified, without perforation or abscess with bleeding: Secondary | ICD-10-CM | POA: Diagnosis not present

## 2017-10-09 DIAGNOSIS — F419 Anxiety disorder, unspecified: Secondary | ICD-10-CM | POA: Diagnosis not present

## 2017-10-09 DIAGNOSIS — G894 Chronic pain syndrome: Secondary | ICD-10-CM | POA: Diagnosis not present

## 2017-10-09 DIAGNOSIS — J9610 Chronic respiratory failure, unspecified whether with hypoxia or hypercapnia: Secondary | ICD-10-CM | POA: Diagnosis not present

## 2017-10-09 DIAGNOSIS — F329 Major depressive disorder, single episode, unspecified: Secondary | ICD-10-CM | POA: Diagnosis not present

## 2017-10-09 DIAGNOSIS — M1990 Unspecified osteoarthritis, unspecified site: Secondary | ICD-10-CM | POA: Diagnosis not present

## 2017-10-09 DIAGNOSIS — I2724 Chronic thromboembolic pulmonary hypertension: Secondary | ICD-10-CM | POA: Diagnosis not present

## 2017-10-09 NOTE — Telephone Encounter (Signed)
[facility] from [facility] called requesting for verbal orders for nursing 2xs for 3 weeks, 1xs for 6 weeks. OT, a home health aide, and palliative care.

## 2017-10-09 NOTE — Patient Outreach (Signed)
[facility]) Care Management  10/09/2017  Sonni Barse [facility] 1961/07/20 [id number]   CSW covering for Nat Christen, LCSW, spoke again with patient who is still not feeling well. "I go see the ENT Doctor tomorrow".  She indicates she has  Transportation arranged for MD and was able to get her RX's filled.    CSW has spoken with Georgiana Shore, BSW, MPH, with [facility] and discussed patient's possible need/benefit from outpatient therapy/counseling due to her mental health needs.  Cindie plans to seek options for patient and advise.   [facility] CSW will continue to follow and further assess and assist with patient needs.    Eduard Clos, MSW, [place] Worker  [place] [phone]

## 2017-10-10 ENCOUNTER — Other Ambulatory Visit: Payer: Self-pay | Admitting: *Deleted

## 2017-10-10 DIAGNOSIS — J382 Nodules of vocal cords: Secondary | ICD-10-CM | POA: Diagnosis not present

## 2017-10-10 DIAGNOSIS — K219 Gastro-esophageal reflux disease without esophagitis: Secondary | ICD-10-CM | POA: Diagnosis not present

## 2017-10-10 NOTE — Telephone Encounter (Signed)
Spoke with Teaching laboratory technician at [facility] and gave verbal orders per Dr. Margarita Rana and informed them I will give them a call back on Monday when Dr. [name] comes back to give them a answer regarding palliative care.  Nira Conn gave me a direct # which is [phone]

## 2017-10-10 NOTE — Patient Outreach (Signed)
[facility] [facility]) Care Management  10/10/2017  Luz Mares [facility] October 11, 1960 [id number]   Call placed back to member per her request to initiate transition of care, however female answering phone state she is not available.  HIPAA compliant message left, will await call back.  If no call back will follow up next week.  Valente David, [place], MSN [place] [phone]

## 2017-10-10 NOTE — Telephone Encounter (Signed)
Verbal orders for nursing, PT, OT, home health only. Palliative care need to be determined by PCP when she is back in the office.

## 2017-10-11 DIAGNOSIS — J9610 Chronic respiratory failure, unspecified whether with hypoxia or hypercapnia: Secondary | ICD-10-CM | POA: Diagnosis not present

## 2017-10-11 DIAGNOSIS — I10 Essential (primary) hypertension: Secondary | ICD-10-CM | POA: Diagnosis not present

## 2017-10-11 DIAGNOSIS — F329 Major depressive disorder, single episode, unspecified: Secondary | ICD-10-CM | POA: Diagnosis not present

## 2017-10-11 DIAGNOSIS — M1990 Unspecified osteoarthritis, unspecified site: Secondary | ICD-10-CM | POA: Diagnosis not present

## 2017-10-11 DIAGNOSIS — G894 Chronic pain syndrome: Secondary | ICD-10-CM | POA: Diagnosis not present

## 2017-10-11 DIAGNOSIS — I2724 Chronic thromboembolic pulmonary hypertension: Secondary | ICD-10-CM | POA: Diagnosis not present

## 2017-10-11 DIAGNOSIS — K5793 Diverticulitis of intestine, part unspecified, without perforation or abscess with bleeding: Secondary | ICD-10-CM | POA: Diagnosis not present

## 2017-10-11 DIAGNOSIS — F419 Anxiety disorder, unspecified: Secondary | ICD-10-CM | POA: Diagnosis not present

## 2017-10-11 DIAGNOSIS — M069 Rheumatoid arthritis, unspecified: Secondary | ICD-10-CM | POA: Diagnosis not present

## 2017-10-13 ENCOUNTER — Telehealth: Payer: Self-pay | Admitting: Pharmacist

## 2017-10-13 ENCOUNTER — Other Ambulatory Visit: Payer: Self-pay | Admitting: *Deleted

## 2017-10-13 ENCOUNTER — Other Ambulatory Visit: Payer: Self-pay | Admitting: Pharmacist

## 2017-10-13 ENCOUNTER — Ambulatory Visit: Payer: Medicare HMO | Attending: Internal Medicine | Admitting: Internal Medicine

## 2017-10-13 ENCOUNTER — Encounter: Payer: Self-pay | Admitting: Internal Medicine

## 2017-10-13 VITALS — BP 138/81 | HR 98 | Temp 98.0°F | Resp 16 | Wt 336.0 lb

## 2017-10-13 DIAGNOSIS — I2724 Chronic thromboembolic pulmonary hypertension: Secondary | ICD-10-CM | POA: Diagnosis not present

## 2017-10-13 DIAGNOSIS — M069 Rheumatoid arthritis, unspecified: Secondary | ICD-10-CM | POA: Insufficient documentation

## 2017-10-13 DIAGNOSIS — Z801 Family history of malignant neoplasm of trachea, bronchus and lung: Secondary | ICD-10-CM | POA: Diagnosis not present

## 2017-10-13 DIAGNOSIS — J9610 Chronic respiratory failure, unspecified whether with hypoxia or hypercapnia: Secondary | ICD-10-CM | POA: Diagnosis not present

## 2017-10-13 DIAGNOSIS — I5032 Chronic diastolic (congestive) heart failure: Secondary | ICD-10-CM | POA: Insufficient documentation

## 2017-10-13 DIAGNOSIS — R490 Dysphonia: Secondary | ICD-10-CM | POA: Diagnosis not present

## 2017-10-13 DIAGNOSIS — Z90710 Acquired absence of both cervix and uterus: Secondary | ICD-10-CM | POA: Insufficient documentation

## 2017-10-13 DIAGNOSIS — M1990 Unspecified osteoarthritis, unspecified site: Secondary | ICD-10-CM | POA: Diagnosis not present

## 2017-10-13 DIAGNOSIS — Z79899 Other long term (current) drug therapy: Secondary | ICD-10-CM | POA: Diagnosis not present

## 2017-10-13 DIAGNOSIS — R05 Cough: Secondary | ICD-10-CM

## 2017-10-13 DIAGNOSIS — Z820 Family history of epilepsy and other diseases of the nervous system: Secondary | ICD-10-CM | POA: Diagnosis not present

## 2017-10-13 DIAGNOSIS — Z813 Family history of other psychoactive substance abuse and dependence: Secondary | ICD-10-CM | POA: Diagnosis not present

## 2017-10-13 DIAGNOSIS — F419 Anxiety disorder, unspecified: Secondary | ICD-10-CM | POA: Diagnosis not present

## 2017-10-13 DIAGNOSIS — Z7902 Long term (current) use of antithrombotics/antiplatelets: Secondary | ICD-10-CM | POA: Diagnosis not present

## 2017-10-13 DIAGNOSIS — Z818 Family history of other mental and behavioral disorders: Secondary | ICD-10-CM | POA: Diagnosis not present

## 2017-10-13 DIAGNOSIS — R058 Other specified cough: Secondary | ICD-10-CM

## 2017-10-13 DIAGNOSIS — Z6841 Body Mass Index (BMI) 40.0 and over, adult: Secondary | ICD-10-CM | POA: Insufficient documentation

## 2017-10-13 DIAGNOSIS — K5793 Diverticulitis of intestine, part unspecified, without perforation or abscess with bleeding: Secondary | ICD-10-CM | POA: Diagnosis not present

## 2017-10-13 DIAGNOSIS — I110 Hypertensive heart disease with heart failure: Secondary | ICD-10-CM | POA: Diagnosis not present

## 2017-10-13 DIAGNOSIS — J181 Lobar pneumonia, unspecified organism: Secondary | ICD-10-CM | POA: Diagnosis not present

## 2017-10-13 DIAGNOSIS — M797 Fibromyalgia: Secondary | ICD-10-CM | POA: Diagnosis not present

## 2017-10-13 DIAGNOSIS — Z9049 Acquired absence of other specified parts of digestive tract: Secondary | ICD-10-CM | POA: Diagnosis not present

## 2017-10-13 DIAGNOSIS — G4733 Obstructive sleep apnea (adult) (pediatric): Secondary | ICD-10-CM | POA: Diagnosis not present

## 2017-10-13 DIAGNOSIS — Z9889 Other specified postprocedural states: Secondary | ICD-10-CM | POA: Diagnosis not present

## 2017-10-13 DIAGNOSIS — M5416 Radiculopathy, lumbar region: Secondary | ICD-10-CM | POA: Diagnosis not present

## 2017-10-13 DIAGNOSIS — I10 Essential (primary) hypertension: Secondary | ICD-10-CM | POA: Diagnosis not present

## 2017-10-13 DIAGNOSIS — F4321 Adjustment disorder with depressed mood: Secondary | ICD-10-CM | POA: Diagnosis not present

## 2017-10-13 DIAGNOSIS — F32A Depression, unspecified: Secondary | ICD-10-CM

## 2017-10-13 DIAGNOSIS — G894 Chronic pain syndrome: Secondary | ICD-10-CM | POA: Diagnosis not present

## 2017-10-13 DIAGNOSIS — F329 Major depressive disorder, single episode, unspecified: Secondary | ICD-10-CM

## 2017-10-13 MED ORDER — OMEPRAZOLE 20 MG PO CPDR: 20.0000 mg | DELAYED_RELEASE_CAPSULE | Freq: Two times a day (BID) | ORAL | 3 refills | Status: DC

## 2017-10-13 MED ORDER — BUSPIRONE HCL 5 MG PO TABS: 5.0000 mg | ORAL_TABLET | Freq: Two times a day (BID) | ORAL | 1 refills | Status: DC

## 2017-10-13 NOTE — Patient Outreach (Signed)
[facility]) Care Management  10/13/2017  Makeya Hilgert [facility] [birth year] [id number]   3rd attempt to start transition of care.  Upon this care manager introducing self again and verifying identity, she immediately begin to express frustration regarding management of health care as well as MDs inability to pinpoint exactly "what's going on."  She is crying, very difficult to understand.  She does state she is experiencing depression and anxiety regarding complexity of her health.  Report medications previously prescribed aren't working.  Has appointment today with primary MD, state she will discuss concerns.  She state she does not have time for transition of care assessment at this time, request call back later this week.  Will follow up within the next 2 business days.  Valente David, [place], MSN [place] [phone]

## 2017-10-13 NOTE — Patient Outreach (Signed)
[facility]) Care Management  10/13/2017  [name] [name] [facility] 1960-09-22 [id number]  Patient called me back after I called her earlier today. HIPAA identifiers were obtained. Patient is a 57 year old female with multiple medical conditions including but not limited to:  Heart failure, depression, CHF, Chronic Pain syndrome, depression, hypertension, morbid obesity, OSA on CPAP, rheumatoid arthritis, vertigo and  History of stoke.  Patient said she was unable to afford her medications but did not have time for a financial assessment or to review her medications because she said [facility] was coming to get her for an appointment.  Patient asked that I call her back later in the day.  I called her back after 4pm and she did not answer.  Plan: Call patient back in 3-5 business days.   [name], PharmD, [place] Clinical Pharmacist [phone]

## 2017-10-13 NOTE — Telephone Encounter (Signed)
Pt has an appointment scheduled 10/13/17 with pcp and per pcp she will discuss the palliative care with pt

## 2017-10-13 NOTE — Progress Notes (Signed)
Patient ID: [name] [name], female    DOB: 06/10/1961  MRN: [id number]  CC: Follow-up (rehab )   Subjective: [name] [name] is a 57 y.o. female who presents for f/u visit Her concerns today include:  57 year old female with history of HTN, morbid obesity, PAH due to CTEPHstatus post thromboembolectomy 7/17 with IVC filter placement and now on [place], OSA, anxiety/depression, chronic pain syndrome with fibromyalgia and questionable RA, chronic chest pain, chronic anemia on iron,  1.  Hoarseness:  Saw ENT last wk.  Told she has inflammation and noted to have "two white spots" on vocal cords -Omeprazole inc o 20 mg BID and will be going to voice therapy  2.  Has appt with neurosurgeon on 10/20/2017. -pain in lower back "is so bad" especially if she does not sit up straight -pain wakes her up at nights -on Tylenol #4 and Gabapentin but finds she still has to take an extra regular Tylenol at times  3. C/o continued productive cough especially at nights. Started in December and was treated for possible influenza with superimposed bacterial pneumonia.  Uses Tessalon Perles PRN -had recent CT scan of chest through [facility] facility where she was for 2 wks for inpt rehab.  Results shown below:  Placed on abx and Prednisone for about 2 wks.  No fever. No TB exposure. -endorses chronic chest pain.  Followed by cardiology  CT scan of chest 09/2017 IMPRESSION: Nonspecific small areas of ground-glass opacities in the right upper lobe. These may represent infectious/inflammatory changes or areas of hypoperfusion. Bilateral lower lobe atelectasis versus scarring. Enlarged heart.  4.  Requesting palliative care through [place] -c/o  Pain in lower back and chest all the time. Very depressed and anxious due to her chronic diseases  -she wants to stop Amitriptyline because she does not feel it is helping.  Reports seeing psychiatry in past but not interested in going  back Patient Active Problem List   Diagnosis Date Noted  . CHF exacerbation ([place]) 08/21/2017  . (HFpEF) heart failure with preserved ejection fraction ([place]) 08/20/2017  . Lumbar radiculopathy 08/08/2017  . Morbid obesity with BMI of 50.0-59.9, adult ([place]) 08/08/2017  . Hoarseness 08/08/2017  . Chronic cough 04/10/2017  . Rheumatoid arthritis involving multiple sites ([place]) 04/10/2017  . Anxiety and depression 04/10/2017  . Adjustment disorder with depressed mood 12/11/2016  . Morbid obesity ([place]) 08/14/2016  . Stroke ([place]) 06/27/2016  . Right sided weakness 06/27/2016  . Essential hypertension 06/27/2016  . Atypical chest pain 06/27/2016  . Difficult airway 03/29/2016  . CTEPH (chronic thromboembolic pulmonary hypertension) ([place]) 03/14/2016  . OSA on CPAP 02/20/2016  . Chronic pain syndrome 01/31/2016  . Pulmonary embolus ([place]) 11/15/2015  . Vertigo 11/15/2015  . Depression 11/15/2015     Current Outpatient Medications on File Prior to Visit  Medication Sig Dispense Refill  . acetaminophen-codeine (TYLENOL #4) 300-60 MG tablet Take 1 tablet by mouth every 8 (eight) hours as needed for moderate pain. 70 tablet 1  . albuterol (PROVENTIL HFA;VENTOLIN HFA) 108 (90 Base) MCG/ACT inhaler Inhale 1-2 puffs into the lungs every 6 (six) hours as needed for wheezing. 1 Inhaler 0  . amitriptyline (ELAVIL) 10 MG tablet Take 1 tablet (10 mg total) by mouth at bedtime. 30 tablet 2  . atorvastatin (LIPITOR) 20 MG tablet Take 1 tablet (20 mg total) by mouth daily at 6 PM. 90 tablet 2  . benzonatate (TESSALON) 100 MG capsule Take 1 capsule (100 mg total)  by mouth every 8 (eight) hours. 21 capsule 0  . cyclobenzaprine (FLEXERIL) 5 MG tablet Take 1 tablet (5 mg total) by mouth 2 (two) times daily as needed. for muscle spams 40 tablet 1  . docusate sodium (COLACE) 100 MG capsule Take 1 capsule (100 mg total) by mouth every 12 (twelve) hours. 30 capsule 0  . doxycycline (VIBRAMYCIN) 100 MG capsule Take 1  capsule (100 mg total) by mouth 2 (two) times daily. 20 capsule 0  . ferrous sulfate (FERROUSUL) 325 (65 FE) MG tablet Take 1 tablet (325 mg total) by mouth daily with breakfast. 90 tablet 3  . fluticasone (FLONASE) 50 MCG/ACT nasal spray Place 2 sprays into both nostrils daily. (Patient taking differently: Place 2 sprays into both nostrils daily as needed for allergies. ) 16 g 6  . furosemide (LASIX) 40 MG tablet Take 1 tablet (40 mg total) by mouth daily. Take extra 40 mg tablet once in the afternoon AS NEEDED for weight gain 3 lbs or more. 60 tablet 3  . gabapentin (NEURONTIN) 300 MG capsule Take 2 capsules (600 mg total) by mouth 3 (three) times daily. 180 capsule 3  . loratadine (CLARITIN) 10 MG tablet Take 1 tablet (10 mg total) by mouth daily. 30 tablet 11  . meclizine (ANTIVERT) 25 MG tablet TAKE ONE TABLET BY MOUTH THREE TIMES DAILY AS NEEDED FOR  DIZZINESS 60 tablet 0  . omeprazole (PRILOSEC) 20 MG capsule Take 1 capsule (20 mg total) by mouth daily. 30 capsule 3  . potassium chloride (K-DUR,KLOR-CON) 10 MEQ tablet Take 20 mEq by mouth 2 (two) times daily.    . predniSONE (DELTASONE) 20 MG tablet Take 1 tablet (20 mg total) by mouth 2 (two) times daily with a meal. (Patient not taking: Reported on 10/08/2017) 10 tablet 0  . Riociguat (ADEMPAS) 1 MG TABS Take 1 mg by mouth 3 (three) times daily. 90 tablet 5  . rivaroxaban (XARELTO) 20 MG TABS tablet Take 1 tablet (20 mg total) by mouth daily with supper. 90 tablet 3  . spironolactone (ALDACTONE) 25 MG tablet Take 0.5 tablets (12.5 mg total) by mouth daily. 30 tablet 5   Current Facility-Administered Medications on File Prior to Visit  Medication Dose Route Frequency Provider Last Rate Last Dose  . heparin flush 10 UNIT/ML injection 5,000 Units  500 mL Intracatheter Q30 days Ladell Pier, MD        No Known Allergies  Social History   Socioeconomic History  . Marital status: Married    Spouse name: Not on file  . Number of  children: Not on file  . Years of education: Not on file  . Highest education level: Not on file  Social Needs  . Financial resource strain: Not on file  . Food insecurity - worry: Not on file  . Food insecurity - inability: Not on file  . Transportation needs - medical: Not on file  . Transportation needs - non-medical: Not on file  Occupational History  . Not on file  Tobacco Use  . Smoking status: Never Smoker  . Smokeless tobacco: Never Used  Substance and Sexual Activity  . Alcohol use: No  . Drug use: No  . Sexual activity: Yes    Partners: Male    Birth control/protection: Surgical  Other Topics Concern  . Not on file  Social History Narrative  . Not on file    Family History  Problem Relation Age of Onset  . Lung cancer Maternal Grandmother   .  Dementia Mother   . Dementia Father   . Anxiety disorder Sister   . Bipolar disorder Sister   . Drug abuse Sister   . Sexual abuse Maternal Aunt   . Anxiety disorder Cousin   . Anxiety disorder Sister   . Bipolar disorder Sister   . Drug abuse Sister   . Breast cancer Neg Hx     Past Surgical History:  Procedure Laterality Date  . ABDOMINAL HYSTERECTOMY    . BREAST REDUCTION SURGERY    . CHOLECYSTECTOMY    . EMBOLECTOMY N/A    pulmonary embolectomy  . REDUCTION MAMMAPLASTY Bilateral   . RIGHT HEART CATH N/A 10/28/2016   Procedure: Right Heart Cath;  Surgeon: Jolaine Artist, MD;  Location: [place] [facility];  Service: Cardiovascular;  Laterality: N/A;  . RIGHT HEART CATH N/A 08/26/2017   Procedure: RIGHT HEART CATH;  Surgeon: Jolaine Artist, MD;  Location: [place] [facility];  Service: Cardiovascular;  Laterality: N/A;  . ULTRASOUND GUIDANCE FOR VASCULAR ACCESS  08/26/2017   Procedure: Ultrasound Guidance For Vascular Access;  Surgeon: Jolaine Artist, MD;  Location: [place] [facility];  Service: Cardiovascular;;    ROS: Review of Systems Neg except as above PHYSICAL EXAM: BP 138/81   Pulse 98    Temp 98 F (36.7 C) (Oral)   Resp 16   Wt (!) 336 lb (152.4 kg)   SpO2 95%   BMI 59.52 kg/m   Wt Readings from Last 3 Encounters:  10/13/17 (!) 336 lb (152.4 kg)  10/08/17 (!) 332 lb (150.6 kg)  09/18/17 (!) 330 lb (149.7 kg)    Physical Exam  General appearance - alert, well appearing, obese female and in no distress Mental status - alert, oriented to person, place, and time.  Tearful at times Chest - clear to auscultation, no wheezes, rales or rhonchi, symmetric air entry Heart - normal rate, regular rhythm, normal S1, S2, no murmurs, rubs, clicks or gallops Musculoskeletal - ambulates with a cane Extremities - no LE edema  ASSESSMENT AND PLAN: 1. Lumbar radiculopathy -keep appt with neurosurgeon.  If not surgical issue, will refer to pain specialist.  Be careful with taking extra Tylenol given she is on Tyl #4  2. Cough with sputum - Ambulatory referral to Pulmonology - QuantiFERON-TB Gold Plus; Future  3. Anxiety and depression Pt declines psychiatry referral.  She declines SSRI Stop Elavil - busPIRone (BUSPAR) 5 MG tablet; Take 1 tablet (5 mg total) by mouth 2 (two) times daily.  Dispense: 60 tablet; Refill: 1  4. Hoarseness of voice Seen by ENT. Omeprazole inc to BID dosing  5. Upper lobe consolidation (HCC) - QuantiFERON-TB Gold Plus; Future  Will go ahead and approve the ok for palliative care eval given PAH with chronic SOB/CP Patient was given the opportunity to ask questions.  Patient verbalized understanding of the plan and was able to repeat key elements of the plan.   No orders of the defined types were placed in this encounter.    Requested Prescriptions    No prescriptions requested or ordered in this encounter    No Follow-up on file.  Karle Plumber, MD, FACP

## 2017-10-13 NOTE — Progress Notes (Signed)
Pt states she has been having left and right chest pain

## 2017-10-13 NOTE — Patient Instructions (Signed)
Stop Amitriptyline. Start Buspar 5 mg twice a day for anxiety.

## 2017-10-13 NOTE — Patient Outreach (Signed)
[facility]) Care Management  10/13/2017  [name] [name] [facility] 1961-07-24 [id number]   Patient was called regarding medication assistance and management. Referral stated patient was discharged from a SNF and was unable to get her prescriptions filled. Unfortunately, the patient did not answer the phone. HIPAA compliant message was left on her voicemail.   Plan: Call patient back in 1-3 business days.  [name], PharmD, [place] Clinical Pharmacist [phone]

## 2017-10-14 ENCOUNTER — Telehealth: Payer: Self-pay | Admitting: Internal Medicine

## 2017-10-14 ENCOUNTER — Other Ambulatory Visit: Payer: Self-pay | Admitting: *Deleted

## 2017-10-14 ENCOUNTER — Other Ambulatory Visit: Payer: Self-pay | Admitting: Pharmacist

## 2017-10-14 DIAGNOSIS — I2724 Chronic thromboembolic pulmonary hypertension: Secondary | ICD-10-CM | POA: Diagnosis not present

## 2017-10-14 DIAGNOSIS — F329 Major depressive disorder, single episode, unspecified: Secondary | ICD-10-CM | POA: Diagnosis not present

## 2017-10-14 DIAGNOSIS — M1990 Unspecified osteoarthritis, unspecified site: Secondary | ICD-10-CM | POA: Diagnosis not present

## 2017-10-14 DIAGNOSIS — K5793 Diverticulitis of intestine, part unspecified, without perforation or abscess with bleeding: Secondary | ICD-10-CM | POA: Diagnosis not present

## 2017-10-14 DIAGNOSIS — G894 Chronic pain syndrome: Secondary | ICD-10-CM | POA: Diagnosis not present

## 2017-10-14 DIAGNOSIS — J9610 Chronic respiratory failure, unspecified whether with hypoxia or hypercapnia: Secondary | ICD-10-CM | POA: Diagnosis not present

## 2017-10-14 DIAGNOSIS — M069 Rheumatoid arthritis, unspecified: Secondary | ICD-10-CM | POA: Diagnosis not present

## 2017-10-14 DIAGNOSIS — I10 Essential (primary) hypertension: Secondary | ICD-10-CM | POA: Diagnosis not present

## 2017-10-14 DIAGNOSIS — M5416 Radiculopathy, lumbar region: Secondary | ICD-10-CM

## 2017-10-14 DIAGNOSIS — F419 Anxiety disorder, unspecified: Secondary | ICD-10-CM | POA: Diagnosis not present

## 2017-10-14 NOTE — Patient Outreach (Signed)
[facility]) Care Management  10/14/2017  [name] [name] [facility] [birth year] [id number]   Call placed back to member to follow up on appointment with primary MD yesterday.  She report she feel the appointment went well.  State she was able to discuss her depression and anxiety.  Elavil discontinued, Buspar started.  Report she requested palliative care referral to help with symptom management as no MD has been able to develop a specific plan of care for her chronic conditions and she feel she is overwhelmed and not able to effectively manage them.  She has multiple MD appointments over the next 3 weeks (neurosurgeon 2/11, heart failure clinic 2/18, pulmonologist 2/28).  She agrees to home visit next week.  If member active with palliative care, will close at that time.  If not, will continue with program and follow up on referral.  [facility] CM Care Plan Problem One     Most Recent Value  Care Plan Problem One  Risk for readmission related to management of chronic health conditions as evidenced by recent hospitalization  Role Documenting the Problem One  Care Management [place] for Problem One  Active  [facility] Long Term Goal   Member will not be readmitted to acute care facility within the next 31 days   [facility] Long Term Goal Start Date  10/14/17  Interventions for Problem One Long Term Goal  Member educated on importance of following discharge instructions in effort to decrease risk for readmission.  AVS from recent MD visit reviewed, discussed new medications.  [facility] CM Short Term Goal #1   Member will keep all appointments with MDs over the next 4 weeks  [facility] CM Short Term Goal #1 Start Date  10/14/17  Interventions for Short Term Goal #1  Upcoming appointments reviewed with member (neurosurgeon, cardiology, pulmonology).  Educated on importance of keeping all appointments in effort to effectively manage chronic conditions  [facility] CM Short Term Goal #2   Member will report contact  with palliative care within the next 2 weeks  [facility] CM Short Term Goal #2 Start Date  10/14/17  Interventions for Short Term Goal #2  Member educated on palliative care program, advised of benefit of symptom management.     Valente David, [place], MSN [place] [phone]

## 2017-10-14 NOTE — Patient Outreach (Signed)
[facility]) Care Management  [place]   10/14/2017  [name] [name] [facility] [name] [name] [id number]  Subjective: Patient was called regarding medication assistance. HIPAA identifiers were obtained. Patient is a 57 year old female with multiple medical conditions including but not limited to:  Anxiety, depression, hypertension, morbid obesity, OSA on CPAP, history of pulmonary embolus, Rheumatoid arthritis, and history of stroke with secondary right sided weakness.  Patient reported using a weekly pill box that is filled weekly by an in-home EMT.    Objective:   Encounter Medications: Outpatient Encounter Medications as of 10/14/2017  Medication Sig Note  . acetaminophen-codeine (TYLENOL #4) 300-60 MG tablet Take 1 tablet by mouth every 8 (eight) hours as needed for moderate pain.   Marland Kitchen albuterol (PROVENTIL HFA;VENTOLIN HFA) 108 (90 Base) MCG/ACT inhaler Inhale 1-2 puffs into the lungs every 6 (six) hours as needed for wheezing.   Marland Kitchen atorvastatin (LIPITOR) 20 MG tablet Take 1 tablet (20 mg total) by mouth daily at 6 PM.   . benzonatate (TESSALON) 100 MG capsule Take 1 capsule (100 mg total) by mouth every 8 (eight) hours.   . busPIRone (BUSPAR) 5 MG tablet Take 1 tablet (5 mg total) by mouth 2 (two) times daily.   . cyclobenzaprine (FLEXERIL) 5 MG tablet Take 1 tablet (5 mg total) by mouth 2 (two) times daily as needed. for muscle spams   . docusate sodium (COLACE) 100 MG capsule Take 1 capsule (100 mg total) by mouth every 12 (twelve) hours.   . ferrous sulfate (FERROUSUL) 325 (65 FE) MG tablet Take 1 tablet (325 mg total) by mouth daily with breakfast.   . fluticasone (FLONASE) 50 MCG/ACT nasal spray Place 2 sprays into both nostrils daily. (Patient taking differently: Place 2 sprays into both nostrils daily as needed for allergies. )   . furosemide (LASIX) 40 MG tablet Take 1 tablet (40 mg total) by mouth daily. Take extra 40 mg tablet once in the afternoon AS NEEDED for  weight gain 3 lbs or more.   . gabapentin (NEURONTIN) 300 MG capsule Take 2 capsules (600 mg total) by mouth 3 (three) times daily.   Marland Kitchen loratadine (CLARITIN) 10 MG tablet Take 1 tablet (10 mg total) by mouth daily.   . meclizine (ANTIVERT) 25 MG tablet TAKE ONE TABLET BY MOUTH THREE TIMES DAILY AS NEEDED FOR  DIZZINESS   . omeprazole (PRILOSEC) 40 MG capsule Take 1 capsule by mouth 2 (two) times daily.   . potassium chloride (K-DUR,KLOR-CON) 10 MEQ tablet Take 20 mEq by mouth 2 (two) times daily.   . Riociguat (ADEMPAS) 1 MG TABS Take 1 mg by mouth 3 (three) times daily.   . rivaroxaban (XARELTO) 20 MG TABS tablet Take 1 tablet (20 mg total) by mouth daily with supper.   Marland Kitchen spironolactone (ALDACTONE) 25 MG tablet Take 0.5 tablets (12.5 mg total) by mouth daily.   . [DISCONTINUED] doxycycline (VIBRAMYCIN) 100 MG capsule Take 1 capsule (100 mg total) by mouth 2 (two) times daily. (Patient not taking: Reported on 10/14/2017) 09/19/2017: Started 09/18/17. 10 days  . [DISCONTINUED] omeprazole (PRILOSEC) 20 MG capsule Take 1 capsule (20 mg total) by mouth 2 (two) times daily before a meal. (Patient not taking: Reported on 10/14/2017) 10/14/2017: Patient is aware of dose change  . [DISCONTINUED] heparin flush 10 UNIT/ML injection 5,000 Units     No facility-administered encounter medications on file as of 10/14/2017.     Functional Status: In your present state of health, do you  have any difficulty performing the following activities: 09/25/2017 09/03/2017  Hearing? N N  Vision? N N  Difficulty concentrating or making decisions? Y N  Walking or climbing stairs? Y Y  Dressing or bathing? Y N  Doing errands, shopping? [name]  Preparing Food and eating ? Y N  Using the Toilet? Y N  In the past six months, have you accidently leaked urine? Y N  Do you have problems with loss of bowel control? N N  Managing your Medications? Y N  Managing your Finances? [name]  Housekeeping or managing your Housekeeping? Y Y  Some  recent data might be hidden    Fall/Depression Screening: Fall Risk  09/25/2017 09/03/2017 03/03/2017  Falls in the past year? Yes Yes Yes  Comment - - -  Number falls in past yr: 1 1 2  or more  Injury with Fall? Yes Yes No  Risk Factor Category  High Fall Risk High Fall Risk -  Risk for fall due to : History of fall(s);Impaired balance/gait;Impaired mobility History of fall(s);Impaired balance/gait;Impaired mobility Impaired balance/gait;Impaired mobility  Risk for fall due to: Comment - - -  Follow up Education provided;Falls prevention discussed Education provided;Falls prevention discussed Falls prevention discussed   PHQ 2/9 Scores 10/13/2017 09/25/2017 09/03/2017 08/08/2017 06/30/2017 06/13/2017 06/06/2017  PHQ - 2 Score 5 1 2 2 3 2 3   PHQ- 9 Score 17 - 4 8 13 8 11       Assessment: Patient's medications were reviewed via telephone.   Drugs sorted by system:  Neurologic/Psychologic: Gabapentin Buspirone   Cardiovascular: Furosemide Xarelto Spironolactone Adempas  Pulmonary/Allergy: Albuterol HFA Benzonatate Fluticasone Loratadine  Gastrointestinal: Meclizine Omeprazole  Pain: Acetaminophen/Codeine #4 Cyclobenzaprine  Vitamins/Minerals: Ferrous Sulfate Potassium Chloride  Misc: Monthly Heparin Drip (discontinued in error)  Medication Assistance Findings:  -Patient has [employer]  -She has already qualified for partial LIS which means she has an $85 copay and a coinsurance of 15% until Catastrophic care.  -All of the patient's medications with the exception of Adempas and Xarelto are tier 1 & 2 medications that can be obtained at [facility] mail order pharmacy for $0  -Xarelto can be obtained as well but the copay for a 3 month supply will be $125  -Patient gets Adempas via a [place] patient assistance program requires patient's to spend 4% of their income in medication expenses. As it is the beginning of the year, the  patient has not spent that yet.  -Patient could use [employer]'s pharmacy and she would be able to receive tier 1&2 medications via the [place] at no cost.  -Patient communicated understanding and said she would prefer to get her medications filled with [facility] and have them delivered to her home. (She was just about to get established with [facility] but said she preferred the $0 copay for tier 1& 2 medications that she can have with [employer])  Plan: -Request Dr. Durenda Age office send new prescriptions to [facility] for 90 day supplies on the patient's behalf.  Heparin was discontinued in error. Called Dr. Durenda Age office to alert them. Held on twice for over 20 minutes.  -Route note directly to Dr. [name].   [name], PharmD, [place] Clinical Pharmacist [phone]

## 2017-10-14 NOTE — Telephone Encounter (Signed)
Pt. Called requesting a refill on Tylenol # 4. Please f/u °

## 2017-10-15 ENCOUNTER — Other Ambulatory Visit (HOSPITAL_COMMUNITY): Payer: Self-pay

## 2017-10-15 DIAGNOSIS — J9610 Chronic respiratory failure, unspecified whether with hypoxia or hypercapnia: Secondary | ICD-10-CM | POA: Diagnosis not present

## 2017-10-15 DIAGNOSIS — I10 Essential (primary) hypertension: Secondary | ICD-10-CM | POA: Diagnosis not present

## 2017-10-15 DIAGNOSIS — M1990 Unspecified osteoarthritis, unspecified site: Secondary | ICD-10-CM | POA: Diagnosis not present

## 2017-10-15 DIAGNOSIS — G894 Chronic pain syndrome: Secondary | ICD-10-CM | POA: Diagnosis not present

## 2017-10-15 DIAGNOSIS — I2724 Chronic thromboembolic pulmonary hypertension: Secondary | ICD-10-CM | POA: Diagnosis not present

## 2017-10-15 DIAGNOSIS — M069 Rheumatoid arthritis, unspecified: Secondary | ICD-10-CM | POA: Diagnosis not present

## 2017-10-15 DIAGNOSIS — F329 Major depressive disorder, single episode, unspecified: Secondary | ICD-10-CM | POA: Diagnosis not present

## 2017-10-15 DIAGNOSIS — K5793 Diverticulitis of intestine, part unspecified, without perforation or abscess with bleeding: Secondary | ICD-10-CM | POA: Diagnosis not present

## 2017-10-15 DIAGNOSIS — F419 Anxiety disorder, unspecified: Secondary | ICD-10-CM | POA: Diagnosis not present

## 2017-10-15 NOTE — Progress Notes (Signed)
Paramedicine Encounter    Patient ID: [name] [name], female    DOB: 1961-06-18, 57 y.o.   MRN: [id number]   Patient Care Team: [name] Pier, [name] as PCP - General (Internal Medicine) [name], [name] [place], LCSW as [employer] [facility] [name] [name] as [place], [place], [facility] [name] as [place] (Pharmacist)  Patient Active Problem List   Diagnosis Date Noted  . CHF exacerbation ([place]) 08/21/2017  . (HFpEF) heart failure with preserved ejection fraction ([place]) 08/20/2017  . Lumbar radiculopathy 08/08/2017  . Morbid obesity with BMI of 50.0-59.9, adult ([place]) 08/08/2017  . Hoarseness 08/08/2017  . Chronic cough 04/10/2017  . Rheumatoid arthritis involving multiple sites ([place]) 04/10/2017  . Anxiety and depression 04/10/2017  . Adjustment disorder with depressed mood 12/11/2016  . Morbid obesity ([place]) 08/14/2016  . Stroke ([place]) 06/27/2016  . Right sided weakness 06/27/2016  . Essential hypertension 06/27/2016  . Atypical chest pain 06/27/2016  . Difficult airway 03/29/2016  . CTEPH (chronic thromboembolic pulmonary hypertension) ([place]) 03/14/2016  . OSA on CPAP 02/20/2016  . Chronic pain syndrome 01/31/2016  . Pulmonary embolus ([address]) 11/15/2015  . Vertigo 11/15/2015  . Depression 11/15/2015    Current Outpatient Medications:  .  acetaminophen-codeine (TYLENOL #4) 300-60 MG tablet, Take 1 tablet by mouth every 8 (eight) hours as needed for moderate pain., Disp: 70 tablet, Rfl: 1 .  albuterol (PROVENTIL HFA;VENTOLIN HFA) 108 (90 Base) MCG/ACT inhaler, Inhale 1-2 puffs into the lungs every 6 (six) hours as needed for wheezing., Disp: 1 Inhaler, Rfl: 0 .  atorvastatin (LIPITOR) 20 MG tablet, Take 1 tablet (20 mg total) by mouth daily at 6 PM., Disp: 90 tablet, Rfl: 2 .  benzonatate (TESSALON) 100 MG capsule, Take 1 capsule (100 mg total) by mouth every 8 (eight) hours., Disp: 21 capsule, Rfl: 0 .   busPIRone (BUSPAR) 5 MG tablet, Take 1 tablet (5 mg total) by mouth 2 (two) times daily., Disp: 60 tablet, Rfl: 1 .  cyclobenzaprine (FLEXERIL) 5 MG tablet, Take 1 tablet (5 mg total) by mouth 2 (two) times daily as needed. for muscle spams, Disp: 40 tablet, Rfl: 1 .  docusate sodium (COLACE) 100 MG capsule, Take 1 capsule (100 mg total) by mouth every 12 (twelve) hours., Disp: 30 capsule, Rfl: 0 .  ferrous sulfate (FERROUSUL) 325 (65 FE) MG tablet, Take 1 tablet (325 mg total) by mouth daily with breakfast., Disp: 90 tablet, Rfl: 3 .  fluticasone (FLONASE) 50 MCG/ACT nasal spray, Place 2 sprays into both nostrils daily. (Patient taking differently: Place 2 sprays into both nostrils daily as needed for allergies. ), Disp: 16 g, Rfl: 6 .  furosemide (LASIX) 40 MG tablet, Take 1 tablet (40 mg total) by mouth daily. Take extra 40 mg tablet once in the afternoon AS NEEDED for weight gain 3 lbs or more., Disp: 60 tablet, Rfl: 3 .  gabapentin (NEURONTIN) 300 MG capsule, Take 2 capsules (600 mg total) by mouth 3 (three) times daily., Disp: 180 capsule, Rfl: 3 .  loratadine (CLARITIN) 10 MG tablet, Take 1 tablet (10 mg total) by mouth daily., Disp: 30 tablet, Rfl: 11 .  meclizine (ANTIVERT) 25 MG tablet, TAKE ONE TABLET BY MOUTH THREE TIMES DAILY AS NEEDED FOR  DIZZINESS, Disp: 60 tablet, Rfl: 0 .  omeprazole (PRILOSEC) 40 MG capsule, Take 1 capsule by mouth 2 (two) times daily., Disp: , Rfl:  .  potassium chloride (K-DUR,KLOR-CON) 10 MEQ tablet, Take  20 mEq by mouth 2 (two) times daily., Disp: , Rfl:  .  Riociguat (ADEMPAS) 1 MG TABS, Take 1 mg by mouth 3 (three) times daily., Disp: 90 tablet, Rfl: 5 .  rivaroxaban (XARELTO) 20 MG TABS tablet, Take 1 tablet (20 mg total) by mouth daily with supper., Disp: 90 tablet, Rfl: 3 .  spironolactone (ALDACTONE) 25 MG tablet, Take 0.5 tablets (12.5 mg total) by mouth daily., Disp: 30 tablet, Rfl: 5 No Known Allergies    Social History   Socioeconomic History  .  Marital status: Married    Spouse name: Not on file  . Number of children: Not on file  . Years of education: Not on file  . Highest education level: Not on file  Social Needs  . Financial resource strain: Not on file  . Food insecurity - worry: Not on file  . Food insecurity - inability: Not on file  . Transportation needs - medical: Not on file  . Transportation needs - non-medical: Not on file  Occupational History  . Not on file  Tobacco Use  . Smoking status: Never Smoker  . Smokeless tobacco: Never Used  Substance and Sexual Activity  . Alcohol use: No  . Drug use: No  . Sexual activity: Yes    Partners: Male    Birth control/protection: Surgical  Other Topics Concern  . Not on file  Social History Narrative  . Not on file    Physical Exam  Constitutional: She is oriented to person, place, and time.  Cardiovascular: Regular rhythm. Tachycardia present.  Abdominal: Soft.  Musculoskeletal: Normal range of motion. She exhibits edema.  Neurological: She is alert and oriented to person, place, and time.  Skin: Skin is warm and dry.  Psychiatric: She has a normal mood and affect.        Future Appointments  Date Time Provider Grundy [name]  10/16/2017  9:30 AM [name] [name] THN-COM None  10/20/2017  8:45 AM [name] [name] [facility]PR[facility]-NR [facility]  10/20/2017 10:45 AM [name] Pier, [name] CHW-CHWW None  10/23/2017 11:30 AM [name] [name] THN-COM None  10/27/2017  8:00 AM WL-MDCC ROOM WL-MDCC None  10/27/2017  1:40 PM [name] [name] MC-HVSC None  11/06/2017 10:15 AM [name] [name] LBPU-PULCARE None  11/07/2017 11:15 AM [name] Pier, [name] CHW-CHWW None  11/24/2017 11:00 AM WL-MDCC ROOM WL-MDCC None  12/26/2017 11:00 AM WL-MDCC ROOM WL-MDCC None    BP 108/74 (BP Location: Right Arm, Patient Position: Sitting, Cuff Size: Large)   Pulse (!) 102   Resp 20   Wt (!) 333 lb (151 kg)   SpO2 97%   BMI 58.99 kg/m   Weight yesterday- 334 lb Last  visit weight- 332 lb  [name] was seen at home today and reported feeling "better than yesterday." She reported being SOB  with exertion despite taking her medications and being compliant with her diet and fluid restrictions. She had PT coming to her house today which she was excited about. Her medications were verified and her pillbox was refilled.  Time spent with patient: 24 minutes   [name], EMT 10/15/17  ACTION: Home visit completed Next visit planned for 1 week

## 2017-10-16 ENCOUNTER — Ambulatory Visit: Payer: Self-pay | Admitting: Pharmacist

## 2017-10-16 ENCOUNTER — Other Ambulatory Visit: Payer: Self-pay | Admitting: Pharmacist

## 2017-10-16 ENCOUNTER — Telehealth: Payer: Self-pay | Admitting: Internal Medicine

## 2017-10-16 ENCOUNTER — Encounter: Payer: Self-pay | Admitting: Licensed Clinical Social Worker

## 2017-10-16 DIAGNOSIS — I2724 Chronic thromboembolic pulmonary hypertension: Secondary | ICD-10-CM | POA: Diagnosis not present

## 2017-10-16 DIAGNOSIS — M5416 Radiculopathy, lumbar region: Secondary | ICD-10-CM | POA: Diagnosis not present

## 2017-10-16 DIAGNOSIS — I272 Pulmonary hypertension, unspecified: Secondary | ICD-10-CM | POA: Diagnosis not present

## 2017-10-16 DIAGNOSIS — F32A Depression, unspecified: Secondary | ICD-10-CM

## 2017-10-16 DIAGNOSIS — F419 Anxiety disorder, unspecified: Secondary | ICD-10-CM

## 2017-10-16 DIAGNOSIS — F329 Major depressive disorder, single episode, unspecified: Secondary | ICD-10-CM

## 2017-10-16 DIAGNOSIS — R0602 Shortness of breath: Secondary | ICD-10-CM | POA: Diagnosis not present

## 2017-10-16 DIAGNOSIS — G4733 Obstructive sleep apnea (adult) (pediatric): Secondary | ICD-10-CM | POA: Diagnosis not present

## 2017-10-16 NOTE — Patient Outreach (Signed)
[facility]) Care Management  10/16/2017  [name] [name] [facility] [birth year] [id number]   Patient was called regarding medication management. Unfortunately she did not answer the phone. A female answered the phone and a HIPAA compliant message was left for the patient.    Dr. Durenda Age office was called on the patient's behalf as I did not hear back from the inbasket message about sending the patient's medications to [facility] for 90 day supplies.   Plan: Call patient back tomorrow to follow up.   [name], PharmD, [place] Clinical Pharmacist [phone]

## 2017-10-16 NOTE — Progress Notes (Signed)
LCSWA faxed completed SCAT application to SCAT Eligibility Department.

## 2017-10-16 NOTE — Telephone Encounter (Signed)
[name] [name] with [facility] with [facility] called and requested for  medications refills to be sent in a 90 day supply to [facility] mail order pharmacy. Patients Heparin was accidently removed and she also asked if you could put it back on her med list.  If any questions Alwyn Ren can be reached at [phone].

## 2017-10-16 NOTE — Telephone Encounter (Signed)
Will forward to pcp

## 2017-10-17 ENCOUNTER — Encounter: Payer: Self-pay | Admitting: *Deleted

## 2017-10-17 ENCOUNTER — Other Ambulatory Visit: Payer: Self-pay | Admitting: *Deleted

## 2017-10-17 ENCOUNTER — Other Ambulatory Visit: Payer: Self-pay | Admitting: Pharmacist

## 2017-10-17 DIAGNOSIS — J9610 Chronic respiratory failure, unspecified whether with hypoxia or hypercapnia: Secondary | ICD-10-CM | POA: Diagnosis not present

## 2017-10-17 DIAGNOSIS — F329 Major depressive disorder, single episode, unspecified: Secondary | ICD-10-CM | POA: Diagnosis not present

## 2017-10-17 DIAGNOSIS — I2724 Chronic thromboembolic pulmonary hypertension: Secondary | ICD-10-CM | POA: Diagnosis not present

## 2017-10-17 DIAGNOSIS — G894 Chronic pain syndrome: Secondary | ICD-10-CM | POA: Diagnosis not present

## 2017-10-17 DIAGNOSIS — I10 Essential (primary) hypertension: Secondary | ICD-10-CM | POA: Diagnosis not present

## 2017-10-17 DIAGNOSIS — F419 Anxiety disorder, unspecified: Secondary | ICD-10-CM | POA: Diagnosis not present

## 2017-10-17 DIAGNOSIS — M1990 Unspecified osteoarthritis, unspecified site: Secondary | ICD-10-CM | POA: Diagnosis not present

## 2017-10-17 DIAGNOSIS — K5793 Diverticulitis of intestine, part unspecified, without perforation or abscess with bleeding: Secondary | ICD-10-CM | POA: Diagnosis not present

## 2017-10-17 DIAGNOSIS — M069 Rheumatoid arthritis, unspecified: Secondary | ICD-10-CM | POA: Diagnosis not present

## 2017-10-17 MED ORDER — ALBUTEROL SULFATE HFA 108 (90 BASE) MCG/ACT IN AERS: 1.0000 | INHALATION_SPRAY | Freq: Four times a day (QID) | RESPIRATORY_TRACT | 6 refills | Status: DC | PRN

## 2017-10-17 MED ORDER — ATORVASTATIN CALCIUM 20 MG PO TABS: 20.0000 mg | ORAL_TABLET | Freq: Every day | ORAL | 3 refills | Status: DC

## 2017-10-17 MED ORDER — ACETAMINOPHEN-CODEINE #4 300-60 MG PO TABS: 1.0000 | ORAL_TABLET | Freq: Three times a day (TID) | ORAL | 2 refills | Status: DC | PRN

## 2017-10-17 MED ORDER — SPIRONOLACTONE 25 MG PO TABS: 12.5000 mg | ORAL_TABLET | Freq: Every day | ORAL | 5 refills | Status: DC

## 2017-10-17 MED ORDER — OMEPRAZOLE 40 MG PO CPDR: 40.0000 mg | DELAYED_RELEASE_CAPSULE | Freq: Two times a day (BID) | ORAL | 3 refills | Status: DC

## 2017-10-17 MED ORDER — FUROSEMIDE 40 MG PO TABS: 40.0000 mg | ORAL_TABLET | Freq: Every day | ORAL | 3 refills | Status: DC

## 2017-10-17 MED ORDER — RIOCIGUAT 1 MG PO TABS: 1.0000 mg | ORAL_TABLET | Freq: Three times a day (TID) | ORAL | 3 refills | Status: DC

## 2017-10-17 MED ORDER — GABAPENTIN 300 MG PO CAPS: 600.0000 mg | ORAL_CAPSULE | Freq: Three times a day (TID) | ORAL | 3 refills | Status: DC

## 2017-10-17 MED ORDER — RIVAROXABAN 20 MG PO TABS: 20.0000 mg | ORAL_TABLET | Freq: Every day | ORAL | 3 refills | Status: DC

## 2017-10-17 MED ORDER — SPIRONOLACTONE 25 MG PO TABS: 12.5000 mg | ORAL_TABLET | Freq: Every day | ORAL | 3 refills | Status: DC

## 2017-10-17 MED ORDER — BUSPIRONE HCL 5 MG PO TABS: 5.0000 mg | ORAL_TABLET | Freq: Two times a day (BID) | ORAL | 3 refills | Status: DC

## 2017-10-17 NOTE — Patient Outreach (Signed)
[name] [name]) Care Management  10/17/2017  [name] [name] [facility] 02/01/1961 [id number]   Patient was called back regarding medications being delivered from [facility]. Dr. Durenda Age office was called and an inbasket message was sent regarding the patient's desire to get her medications filled at [facility].  Patient had requested a refill of Tylenol #4 but said she had an actual hard copy from when she was discharged. [name] was scheduled to deliver to the patient today.  Plan:  Follow up with Dr. Durenda Age office again next week in reference to the Acetaminophen #4 prescription.  Plan:  Call patient and provider's office back in 5-7 business days.  [name], PharmD, [place] Clinical Pharmacist [phone]

## 2017-10-17 NOTE — Telephone Encounter (Signed)
Tylenol #4 request.  Rxn was written but not dispense as review of [place] shows she still has a RF on rxn written 08/2017.

## 2017-10-17 NOTE — Patient Outreach (Addendum)
[facility]) Care Management  10/17/2017  Azjah Pardo [facility] May 03, 1961 [id number]   CSW was able to make contact with patient today to follow-up regarding social work services and resources, as well as assess need for continued social work involvement.  CSW is aware that patient is currently working with Georgiana Shore, [place] Social Worker with [place] regarding transportation needs,as well as with arranging mental health services for patient in the home.  Patient reported that she is currently awaiting a return call from Mrs. Sillmon with regards to mental health agencies that she was looking into for patient.  CSW agreed to mail patient a list of mental health resources; however, patient declined, having confidence in Mrs. Sillmon that she will be able to arrange mental health services for her. Patient is aware that she is able to utilize [employer] for her transportation needs.  CSW provided patient with the contact information for [employer] and explained that patient is entitled to 12 free rides per fiscal year.  Patient reported that she also has a SCAT Paramedic) application that has been given to her that she will seek assistance with completing, if necessary.  Patient reports feeling much better today and is getting readjusted at home.  Home health nursing, physical therapy and occupational therapy services are also being provided to patient through [facility].  CSW will perform a case closure on patient, as to not duplicate services with the social worker through [place].  CSW was able to ensure that patient has the correct contact information for CSW, encouraging patient to contact CSW directly if social work needs arise in the near future.  Patient voiced understanding and was agreeable to this plan. CSW will perform a case closure on patient, as all goals of treatment have been met from  social work standpoint and no additional social work needs have been identified at this time.  CSW will notify patient's RNCM with [place] Management, Valente David of CSW's plans to close patient's case.  CSW will fax an update to patient's Primary Care Physician, Dr. Karle Plumber to ensure that they are aware of CSW's involvement with patient's plan of care.  CSW will submit a case closure request to Alycia Rossetti, Care Management Assistant with [place] Management, in the form of an [employer].  CSW will ensure that Mrs. Arelia Sneddon is aware of Mrs. Lane's, RNCM with [place] Management, continued involvement with patient's care. THN CM Care Plan Problem One     Most Recent Value  Care Plan Problem One  Level of care issues.  Role Documenting the Problem One  Clinical Social Worker  Care Plan for Problem One  Active  [facility] Long Term Goal   Patient will have home health services and durable medical equipment in place prior to discharge from the skilled nursing facility, within the next 45 days.  THN Long Term Goal Start Date  09/25/17  THN Long Term Goal Met Date  10/17/17  Interventions for Problem One Long Term Goal  CSW will assist patient and discharge planning coordinator at the skilled facility with arranging discharge plans for patient.  THN CM Short Term Goal #1   Patient will decide on a home health agency of choice, within the next three weeks.  THN CM Short Term Goal #1 Start Date  09/25/17  [facility] CM Short Term Goal #1 Met Date  10/17/17  Interventions  for Short Term Goal #1  CSW will provide patient with a list of home health agencies.  THN CM Short Term Goal #2   Patient will decide on an agency of choice, with regards to durable medical equipment, within the next three weeks.  THN CM Short Term Goal #2 Start Date  09/25/17  [facility] CM Short Term Goal #2 Met Date  10/17/17  Interventions for Short Term Goal #2  CSW will  provide patient with a list of agencies offering durable medical equipment.     Nat Christen, BSW, MSW, LCSW  Licensed Education officer, [facility]  Mailing [place] [address], [place], [place] [address] Physical Address-[address], [place], [place] [address] Toll Free Main # [phone] Fax # [phone] Cell # [phone]  Office # [phone] [email or web address]

## 2017-10-20 ENCOUNTER — Ambulatory Visit: Payer: Self-pay | Admitting: Internal Medicine

## 2017-10-20 ENCOUNTER — Ambulatory Visit: Payer: Medicare HMO | Attending: Otolaryngology

## 2017-10-21 ENCOUNTER — Telehealth: Payer: Self-pay | Admitting: Internal Medicine

## 2017-10-21 DIAGNOSIS — I2724 Chronic thromboembolic pulmonary hypertension: Secondary | ICD-10-CM | POA: Diagnosis not present

## 2017-10-21 DIAGNOSIS — F329 Major depressive disorder, single episode, unspecified: Secondary | ICD-10-CM | POA: Diagnosis not present

## 2017-10-21 DIAGNOSIS — F419 Anxiety disorder, unspecified: Secondary | ICD-10-CM | POA: Diagnosis not present

## 2017-10-21 DIAGNOSIS — M069 Rheumatoid arthritis, unspecified: Secondary | ICD-10-CM | POA: Diagnosis not present

## 2017-10-21 DIAGNOSIS — K5793 Diverticulitis of intestine, part unspecified, without perforation or abscess with bleeding: Secondary | ICD-10-CM | POA: Diagnosis not present

## 2017-10-21 DIAGNOSIS — G894 Chronic pain syndrome: Secondary | ICD-10-CM | POA: Diagnosis not present

## 2017-10-21 DIAGNOSIS — I10 Essential (primary) hypertension: Secondary | ICD-10-CM | POA: Diagnosis not present

## 2017-10-21 DIAGNOSIS — M1990 Unspecified osteoarthritis, unspecified site: Secondary | ICD-10-CM | POA: Diagnosis not present

## 2017-10-21 DIAGNOSIS — J9610 Chronic respiratory failure, unspecified whether with hypoxia or hypercapnia: Secondary | ICD-10-CM | POA: Diagnosis not present

## 2017-10-21 NOTE — Telephone Encounter (Signed)
Patients palliative care NP called requesting for patient to be put on an antidepressant and patient is not suicidal. Please fu at your earliest convenience.

## 2017-10-22 ENCOUNTER — Telehealth: Payer: Self-pay | Admitting: Internal Medicine

## 2017-10-22 ENCOUNTER — Other Ambulatory Visit: Payer: Self-pay | Admitting: *Deleted

## 2017-10-22 ENCOUNTER — Other Ambulatory Visit (HOSPITAL_COMMUNITY): Payer: Self-pay

## 2017-10-22 ENCOUNTER — Other Ambulatory Visit: Payer: Self-pay | Admitting: Pharmacist

## 2017-10-22 DIAGNOSIS — G894 Chronic pain syndrome: Secondary | ICD-10-CM | POA: Diagnosis not present

## 2017-10-22 DIAGNOSIS — R0602 Shortness of breath: Secondary | ICD-10-CM | POA: Diagnosis not present

## 2017-10-22 DIAGNOSIS — M069 Rheumatoid arthritis, unspecified: Secondary | ICD-10-CM | POA: Diagnosis not present

## 2017-10-22 DIAGNOSIS — J9610 Chronic respiratory failure, unspecified whether with hypoxia or hypercapnia: Secondary | ICD-10-CM | POA: Diagnosis not present

## 2017-10-22 DIAGNOSIS — F419 Anxiety disorder, unspecified: Secondary | ICD-10-CM | POA: Diagnosis not present

## 2017-10-22 DIAGNOSIS — F329 Major depressive disorder, single episode, unspecified: Secondary | ICD-10-CM | POA: Diagnosis not present

## 2017-10-22 DIAGNOSIS — K5793 Diverticulitis of intestine, part unspecified, without perforation or abscess with bleeding: Secondary | ICD-10-CM | POA: Diagnosis not present

## 2017-10-22 DIAGNOSIS — M1990 Unspecified osteoarthritis, unspecified site: Secondary | ICD-10-CM | POA: Diagnosis not present

## 2017-10-22 DIAGNOSIS — I10 Essential (primary) hypertension: Secondary | ICD-10-CM | POA: Diagnosis not present

## 2017-10-22 DIAGNOSIS — I2724 Chronic thromboembolic pulmonary hypertension: Secondary | ICD-10-CM | POA: Diagnosis not present

## 2017-10-22 NOTE — Telephone Encounter (Signed)
NP called stating that the patient would like to be put on antidepressants. Please fu with NP at [phone]

## 2017-10-22 NOTE — Telephone Encounter (Signed)
Error

## 2017-10-22 NOTE — Patient Outreach (Signed)
[facility]) Care Management  10/22/2017  [name] [name] [facility] 02-15-1961 [id number]   Patient was called to follow up on medication assistance.  HIPAA identifiers were obtained. Patient's PCP messaged me to let me know she sent the patient's prescriptions to [facility]. After letting the patient know this, she said she would have to call me back because she was on the other line.  Patient called me back and said that [name] called her right after I did and said her prescriptions would be $150.    [name] was called on the patient's behalf. Xarelto had a $125 copay for a 3 month supply and the albuterol inhaler had a copay of $26.  The pharmacy was asked to profile both the Xarelto and inhaler and just fill the other medications.  The pharmacy confirmed the following would be mailed to the patient with a copay of $0 for 90 day supplies:  Omeprazole Atorvastatin Furosemide Spironolactone Gabapentin  Buspirone  Potassium was filled at [facility] (according to the patient) but was not sent to [facility].    Loratadine was also not sent to [facility]  (Patient's PCP will be sent another message to send the Potassium and Loratadine to the [facility] for 90 day supplies.) Patient's K on 09/19/17 was 3.8 mmol/L   In addition to the prescription medications that will be delivered to the patient in the next 3-5 business days, it was discovered that the patient has an OTC benefit of $50 per quarter.    As such, an order was placed for acetaminophen and docusate. Both OTC products will be delivered to the patient's home within 7-10 business days.   Confirmation number:  [id number]  Plan: Follow up with Dr. [name] about sending prescriptions for the potassium and loratadine  Follow up with the patient in 7-10 business days to follow up on delivery from [facility].  [name], PharmD, [place] Clinical  Pharmacist [phone]

## 2017-10-22 NOTE — Telephone Encounter (Signed)
PC returned to palliative care NP Gean Quint.  I left message stating that the pt she called about yesterday (I did not name the pt) is not interested in being placed on an antidepressant.  Informed her that I have spoken with pt about this a few times.  She can give a call back if she has any questions.

## 2017-10-22 NOTE — Patient Outreach (Signed)
[facility]) Care Management  10/22/2017  [name] [name] [facility] 05-31-1961 [id number]   Notified by Erskin Burnet that member called and left her a message requesting call back.  This care manager placed follow up call to member.  She state she was trying to reach [facility] pharmacist, K. Luciana Axe, state she was able to contact.  This care manager inquired about home visit scheduled for tomorrow.  Initially she requested a call prior to visit time to see if she was up to visits.  Call received back from member immediately stating that she feel there is nothing more that [facility] will be able to provide her and would like to discontinue services.  State that she is involved with Palliative care, was seen by NP today.  She is open to have Mrs. Luciana Axe follow up regarding medications.  This care manager will close nursing case, will inform primary MD and pharmacist.   Valente David, RN, MSN [place] Manager [phone]

## 2017-10-22 NOTE — Progress Notes (Addendum)
Paramedicine Encounter    Patient ID: [name] [name], female    DOB: 1961-01-31, 57 y.o.   MRN: [id number]   Patient Care Team: Ladell Pier, MD as PCP - General (Internal Medicine) [name], [facility] (Pharmacist)  Patient Active Problem List   Diagnosis Date Noted  . CHF exacerbation ([place]) 08/21/2017  . (HFpEF) heart failure with preserved ejection fraction ([place]) 08/20/2017  . Lumbar radiculopathy 08/08/2017  . Morbid obesity with BMI of 50.0-59.9, adult ([place]) 08/08/2017  . Hoarseness 08/08/2017  . Chronic cough 04/10/2017  . Rheumatoid arthritis involving multiple sites ([place]) 04/10/2017  . Anxiety and depression 04/10/2017  . Adjustment disorder with depressed mood 12/11/2016  . Morbid obesity ([place]) 08/14/2016  . Stroke ([place]) 06/27/2016  . Right sided weakness 06/27/2016  . Essential hypertension 06/27/2016  . Atypical chest pain 06/27/2016  . Difficult airway 03/29/2016  . CTEPH (chronic thromboembolic pulmonary hypertension) ([place]) 03/14/2016  . OSA on CPAP 02/20/2016  . Chronic pain syndrome 01/31/2016  . Pulmonary embolus ([place]) 11/15/2015  . Vertigo 11/15/2015  . Depression 11/15/2015    Current Outpatient Medications:  .  acetaminophen-codeine (TYLENOL #4) 300-60 MG tablet, Take 1 tablet by mouth every 8 (eight) hours as needed for moderate pain (This rxn was not dispensed as I later saw pt still had a RF left on last rxn)., Disp: 90 tablet, Rfl: 2 .  albuterol (PROVENTIL HFA;VENTOLIN HFA) 108 (90 Base) MCG/ACT inhaler, Inhale 1-2 puffs into the lungs every 6 (six) hours as needed for wheezing., Disp: 3 Inhaler, Rfl: 6 .  atorvastatin (LIPITOR) 20 MG tablet, Take 1 tablet (20 mg total) by mouth daily at 6 PM., Disp: 90 tablet, Rfl: 3 .  benzonatate (TESSALON) 100 MG capsule, Take 1 capsule (100 mg total) by mouth every 8 (eight) hours., Disp: 21 capsule, Rfl: 0 .  busPIRone (BUSPAR) 5 MG tablet, Take 1 tablet (5 mg  total) by mouth 2 (two) times daily., Disp: 180 tablet, Rfl: 3 .  cyclobenzaprine (FLEXERIL) 5 MG tablet, Take 1 tablet (5 mg total) by mouth 2 (two) times daily as needed. for muscle spams, Disp: 40 tablet, Rfl: 1 .  docusate sodium (COLACE) 100 MG capsule, Take 1 capsule (100 mg total) by mouth every 12 (twelve) hours., Disp: 30 capsule, Rfl: 0 .  ferrous sulfate (FERROUSUL) 325 (65 FE) MG tablet, Take 1 tablet (325 mg total) by mouth daily with breakfast., Disp: 90 tablet, Rfl: 3 .  fluticasone (FLONASE) 50 MCG/ACT nasal spray, Place 2 sprays into both nostrils daily. (Patient taking differently: Place 2 sprays into both nostrils daily as needed for allergies. ), Disp: 16 g, Rfl: 6 .  furosemide (LASIX) 40 MG tablet, Take 1 tablet (40 mg total) by mouth daily. Take extra 40 mg tablet once in the afternoon AS NEEDED for weight gain 3 lbs or more., Disp: 180 tablet, Rfl: 3 .  gabapentin (NEURONTIN) 300 MG capsule, Take 2 capsules (600 mg total) by mouth 3 (three) times daily., Disp: 540 capsule, Rfl: 3 .  loratadine (CLARITIN) 10 MG tablet, Take 1 tablet (10 mg total) by mouth daily., Disp: 30 tablet, Rfl: 11 .  meclizine (ANTIVERT) 25 MG tablet, TAKE ONE TABLET BY MOUTH THREE TIMES DAILY AS NEEDED FOR  DIZZINESS, Disp: 60 tablet, Rfl: 0 .  omeprazole (PRILOSEC) 40 MG capsule, Take 1 capsule (40 mg total) by mouth 2 (two) times daily., Disp: 180 capsule, Rfl: 3 .  potassium chloride (K-DUR,KLOR-CON) 10  MEQ tablet, Take 20 mEq by mouth 2 (two) times daily., Disp: , Rfl:  .  Riociguat (ADEMPAS) 1 MG TABS, Take 1 mg by mouth 3 (three) times daily., Disp: 270 tablet, Rfl: 3 .  rivaroxaban (XARELTO) 20 MG TABS tablet, Take 1 tablet (20 mg total) by mouth daily with supper., Disp: 90 tablet, Rfl: 3 .  spironolactone (ALDACTONE) 25 MG tablet, Take 0.5 tablets (12.5 mg total) by mouth daily., Disp: 45 tablet, Rfl: 3 No Known Allergies    Social History   Socioeconomic History  . Marital status: Married     Spouse name: Not on file  . Number of children: Not on file  . Years of education: Not on file  . Highest education level: Not on file  Social Needs  . Financial resource strain: Not on file  . Food insecurity - worry: Not on file  . Food insecurity - inability: Not on file  . Transportation needs - medical: Not on file  . Transportation needs - non-medical: Not on file  Occupational History  . Not on file  Tobacco Use  . Smoking status: Never Smoker  . Smokeless tobacco: Never Used  Substance and Sexual Activity  . Alcohol use: No  . Drug use: No  . Sexual activity: Yes    Partners: Male    Birth control/protection: Surgical  Other Topics Concern  . Not on file  Social History Narrative  . Not on file    Physical Exam  Constitutional: She is oriented to person, place, and time.  Cardiovascular: Normal rate and regular rhythm.  Pulmonary/Chest: Breath sounds normal. No respiratory distress. She has no wheezes. She has no rales.  Abdominal: Soft.  Musculoskeletal: Normal range of motion. She exhibits edema.  Neurological: She is alert and oriented to person, place, and time.  Skin: Skin is warm and dry.  Psychiatric: She exhibits a depressed mood.        Future Appointments  Date Time Provider [place]  10/27/2017  8:00 AM [facility] ROOM WL-MDCC None  10/27/2017  1:40 PM Bensimhon, Shaune Pascal, MD MC-HVSC None  10/31/2017  9:00 AM [name], RPH THN-COM None  11/06/2017 10:15 AM [name], MD LBPU-PULCARE None  11/07/2017 11:15 AM Ladell Pier, MD CHW-CHWW None  11/24/2017 11:00 AM WL-MDCC ROOM WL-MDCC None  12/26/2017 11:00 AM WL-MDCC ROOM WL-MDCC None    BP 120/88 (BP Location: Left Arm, Patient Position: Sitting, Cuff Size: Large)   Pulse 74   Resp 16   Wt (!) 342 lb 9.6 oz (155.4 kg)   SpO2 96%   BMI 60.69 kg/m   Weight yesterday- UTO Last visit weight- 333 lb   Ms Hechavarria was seen at home today and was not having a good day. She was  very upset and about her health and stated she did not want people coming to her house anymore. While I was filling her pillbox she stated she was not going to take her potassium at prescribed because she does not think "the doctors know what [her] potassium level is so why should [she] take two?" I explained that she was prescribed this dose based off her blood work when she was in the hospital but she was unsatisfied with this answer. She has not been able to weigh because the new scale [facility] gave her was limited to 330 lb she exceeds that amount. She has not used the scale she hd previously been using but was not able to  give and answer as to why. She went on to say she is depressed but was not willing to see a counselor when I asked about it. She said she had been to counseling in the past but she did not want to go right now. She did not give an explanation for this. Her medications were verified and her pillbox was filled with the appropriate doses. If she does not want to take the extra potassium, she will have to remove it from each bin as I explained that I was required to fill her box as the medications are prescribed. She also stated that Dr Haroldine Laws has her "running around with high blood pressure and not on any medication for it." I explained that she is on several medications to decrease her blood pressure but she was reluctant to hear about it. Her weight was up 9 pounds since last week so I directed her to take an additional lasix per her medication list. She took the medication but was frustrated about the weight gain saying that she "hasn't been eating." I will follow up with the clinic tomorrow and contact [facility] about a different scale.   Time spent with patient: 49 minutes  [name], EMT 10/22/17  ACTION: Home visit completed Next visit planned for 1 week

## 2017-10-22 NOTE — Telephone Encounter (Signed)
Will forward to pcp

## 2017-10-23 ENCOUNTER — Encounter: Payer: Self-pay | Admitting: *Deleted

## 2017-10-23 ENCOUNTER — Ambulatory Visit: Payer: Self-pay | Admitting: *Deleted

## 2017-10-23 DIAGNOSIS — F329 Major depressive disorder, single episode, unspecified: Secondary | ICD-10-CM | POA: Diagnosis not present

## 2017-10-23 DIAGNOSIS — G894 Chronic pain syndrome: Secondary | ICD-10-CM | POA: Diagnosis not present

## 2017-10-23 DIAGNOSIS — K5793 Diverticulitis of intestine, part unspecified, without perforation or abscess with bleeding: Secondary | ICD-10-CM | POA: Diagnosis not present

## 2017-10-23 DIAGNOSIS — J9610 Chronic respiratory failure, unspecified whether with hypoxia or hypercapnia: Secondary | ICD-10-CM | POA: Diagnosis not present

## 2017-10-23 DIAGNOSIS — M069 Rheumatoid arthritis, unspecified: Secondary | ICD-10-CM | POA: Diagnosis not present

## 2017-10-23 DIAGNOSIS — I2724 Chronic thromboembolic pulmonary hypertension: Secondary | ICD-10-CM | POA: Diagnosis not present

## 2017-10-23 DIAGNOSIS — I10 Essential (primary) hypertension: Secondary | ICD-10-CM | POA: Diagnosis not present

## 2017-10-23 DIAGNOSIS — F419 Anxiety disorder, unspecified: Secondary | ICD-10-CM | POA: Diagnosis not present

## 2017-10-23 DIAGNOSIS — M1990 Unspecified osteoarthritis, unspecified site: Secondary | ICD-10-CM | POA: Diagnosis not present

## 2017-10-23 MED ORDER — DULOXETINE HCL 30 MG PO CPEP: 30.0000 mg | ORAL_CAPSULE | Freq: Every day | ORAL | 3 refills | Status: DC

## 2017-10-23 NOTE — Telephone Encounter (Signed)
Returned PC to NP [employer].  She reports she has seen p x 2 in the home; last visit was yesterday.  Reports pt very depressed but not suicidal. Pt tearful, not sleeping well, dec appetite.  NP noted that she did d/c Elavil per her last visit with me.  She recommended that pt get back to a counselor but pt declines doing so. Pt reports chronic pain, poor health, stress of caring for grandson and mother being in nursing facility as all contributing to depression.  NP discussed and encouraged her to get on antidepressant.  Pt finally agreed.  NP and I discussed putting her on Cymbalta to help with depression and chronic pain.  She request rxn be sent to [facility].

## 2017-10-27 ENCOUNTER — Encounter (HOSPITAL_COMMUNITY): Payer: Self-pay

## 2017-10-27 ENCOUNTER — Encounter (HOSPITAL_COMMUNITY): Payer: Medicare HMO

## 2017-10-27 ENCOUNTER — Telehealth: Payer: Self-pay | Admitting: Internal Medicine

## 2017-10-27 ENCOUNTER — Ambulatory Visit (HOSPITAL_COMMUNITY)
Admission: RE | Admit: 2017-10-27 | Discharge: 2017-10-27 | Disposition: A | Discharge status: Home / Self Care | Payer: Medicare HMO | Source: Ambulatory Visit | Attending: Internal Medicine | Admitting: Internal Medicine

## 2017-10-27 ENCOUNTER — Telehealth: Payer: Self-pay

## 2017-10-27 ENCOUNTER — Inpatient Hospital Stay (HOSPITAL_COMMUNITY): Admission: RE | Admit: 2017-10-27 | Payer: Self-pay | Source: Ambulatory Visit | Admitting: Internal Medicine

## 2017-10-27 DIAGNOSIS — I509 Heart failure, unspecified: Secondary | ICD-10-CM | POA: Diagnosis not present

## 2017-10-27 DIAGNOSIS — Z452 Encounter for adjustment and management of vascular access device: Secondary | ICD-10-CM | POA: Insufficient documentation

## 2017-10-27 DIAGNOSIS — I272 Pulmonary hypertension, unspecified: Secondary | ICD-10-CM | POA: Diagnosis not present

## 2017-10-27 DIAGNOSIS — L97509 Non-pressure chronic ulcer of other part of unspecified foot with unspecified severity: Secondary | ICD-10-CM | POA: Diagnosis not present

## 2017-10-27 MED ORDER — SODIUM CHLORIDE 0.9% FLUSH
10.0000 mL | INTRAVENOUS | Status: DC
  Administered 2017-10-27: 10 mL via INTRAVENOUS
  Filled 2017-10-27: qty 10

## 2017-10-27 MED ORDER — HEPARIN SOD (PORK) LOCK FLUSH 100 UNIT/ML IV SOLN
500.0000 [IU] | INTRAVENOUS | Status: DC
  Administered 2017-10-27: 500 [IU] via INTRAVENOUS
  Filled 2017-10-27: qty 5

## 2017-10-27 NOTE — Telephone Encounter (Signed)
Call placed to Ronny Bacon, [place] # [phone] at request of Christa See, LCSW.  Jasmine had received a message from [place] requesting a call back.   Jenny Reichmann wanted to provide an update on that patient She explained that the patient has discharged home PT and OT and SW. She had considered stopping the home health nurse, but has agreed to have her to visit for another week. The patient is also involved with the heart failure clinic and the para-medicine program. Jenny Reichmann said that the patient told her that she needed an anti-depressant and she saw someone at the [facility] but nothing was done. The EMT however noted that she had cymbalta in her medication box Informed Jenny Reichmann that the cymbalta was prescribed by Dr [name].  Jenny Reichmann stated that the patient has refused to leave the house for any counseling, supportive programs. She " won't do anything" to help herself. She has however accepted the palliative care SW.   Jenny Reichmann stated that she would inform this clinic when all services have stopped.

## 2017-10-27 NOTE — Telephone Encounter (Signed)
Call placed to # [phone] and spoke to patient. She stated that [facility] # appeared on her husband's phone and he notified her. Explained to her that this CM has not tried to contact him or her and there is no documentation that this CM can see that [facility] has tried to reach her today. She stated that she understood.

## 2017-10-27 NOTE — Telephone Encounter (Signed)
Patient's Husband was returning [place] phone call. Patient stated if trying to reach his wife please call [phone].

## 2017-10-27 NOTE — Progress Notes (Signed)
Accessed patient's right upper chest PAC using 19 g  1.5 inch huber needle with another RN available to stabilize PAC due to patient's size. Unsuccessful attempt before RN was able to access PAC. Blood return was seen.

## 2017-10-28 ENCOUNTER — Other Ambulatory Visit (HOSPITAL_COMMUNITY): Payer: Self-pay | Admitting: Pharmacist

## 2017-10-28 ENCOUNTER — Other Ambulatory Visit (HOSPITAL_COMMUNITY): Payer: Self-pay

## 2017-10-28 DIAGNOSIS — I10 Essential (primary) hypertension: Secondary | ICD-10-CM | POA: Diagnosis not present

## 2017-10-28 DIAGNOSIS — F419 Anxiety disorder, unspecified: Secondary | ICD-10-CM | POA: Diagnosis not present

## 2017-10-28 DIAGNOSIS — I2724 Chronic thromboembolic pulmonary hypertension: Secondary | ICD-10-CM | POA: Diagnosis not present

## 2017-10-28 DIAGNOSIS — J9610 Chronic respiratory failure, unspecified whether with hypoxia or hypercapnia: Secondary | ICD-10-CM | POA: Diagnosis not present

## 2017-10-28 DIAGNOSIS — G894 Chronic pain syndrome: Secondary | ICD-10-CM | POA: Diagnosis not present

## 2017-10-28 DIAGNOSIS — M069 Rheumatoid arthritis, unspecified: Secondary | ICD-10-CM | POA: Diagnosis not present

## 2017-10-28 DIAGNOSIS — K5793 Diverticulitis of intestine, part unspecified, without perforation or abscess with bleeding: Secondary | ICD-10-CM | POA: Diagnosis not present

## 2017-10-28 DIAGNOSIS — M1990 Unspecified osteoarthritis, unspecified site: Secondary | ICD-10-CM | POA: Diagnosis not present

## 2017-10-28 DIAGNOSIS — F329 Major depressive disorder, single episode, unspecified: Secondary | ICD-10-CM | POA: Diagnosis not present

## 2017-10-28 MED ORDER — POTASSIUM CHLORIDE CRYS ER 10 MEQ PO TBCR: 20.0000 meq | EXTENDED_RELEASE_TABLET | Freq: Two times a day (BID) | ORAL | 2 refills | Status: DC

## 2017-10-28 NOTE — Progress Notes (Signed)
Paramedicine Encounter    Patient ID: [name] [name], female    DOB: [birth year], 57 y.o.   MRN: [id number]   Patient Care Team: [name] Pier, MD as PCP - General (Internal Medicine) [name] [name], [facility] as [place] Management (Pharmacist)  Patient Active Problem List   Diagnosis Date Noted  . CHF exacerbation ([place]) 08/21/2017  . (HFpEF) heart failure with preserved ejection fraction ([place]) 08/20/2017  . Lumbar radiculopathy 08/08/2017  . Morbid obesity with BMI of 50.0-59.9, adult ([place]) 08/08/2017  . Hoarseness 08/08/2017  . Chronic cough 04/10/2017  . Rheumatoid arthritis involving multiple sites ([place]) 04/10/2017  . Anxiety and depression 04/10/2017  . Adjustment disorder with depressed mood 12/11/2016  . Morbid obesity ([place]) 08/14/2016  . Stroke ([place]) 06/27/2016  . Right sided weakness 06/27/2016  . Essential hypertension 06/27/2016  . Atypical chest pain 06/27/2016  . Difficult airway 03/29/2016  . CTEPH (chronic thromboembolic pulmonary hypertension) ([place]) 03/14/2016  . OSA on CPAP 02/20/2016  . Chronic pain syndrome 01/31/2016  . Pulmonary embolus ([place]) 11/15/2015  . Vertigo 11/15/2015  . Depression 11/15/2015    Current Outpatient Medications:  .  acetaminophen-codeine (TYLENOL #4) 300-60 MG tablet, Take 1 tablet by mouth every 8 (eight) hours as needed for moderate pain (This rxn was not dispensed as I later saw pt still had a RF left on last rxn)., Disp: 90 tablet, Rfl: 2 .  albuterol (PROVENTIL HFA;VENTOLIN HFA) 108 (90 Base) MCG/ACT inhaler, Inhale 1-2 puffs into the lungs every 6 (six) hours as needed for wheezing., Disp: 3 Inhaler, Rfl: 6 .  atorvastatin (LIPITOR) 20 MG tablet, Take 1 tablet (20 mg total) by mouth daily at 6 PM., Disp: 90 tablet, Rfl: 3 .  busPIRone (BUSPAR) 5 MG tablet, Take 1 tablet (5 mg total) by mouth 2 (two) times daily., Disp: 180 tablet, Rfl: 3 .  docusate sodium (COLACE) 100 MG capsule, Take 1 capsule (100 mg  total) by mouth every 12 (twelve) hours., Disp: 30 capsule, Rfl: 0 .  DULoxetine (CYMBALTA) 30 MG capsule, Take 1 capsule (30 mg total) by mouth daily., Disp: 30 capsule, Rfl: 3 .  ferrous sulfate (FERROUSUL) 325 (65 FE) MG tablet, Take 1 tablet (325 mg total) by mouth daily with breakfast., Disp: 90 tablet, Rfl: 3 .  fluticasone (FLONASE) 50 MCG/ACT nasal spray, Place 2 sprays into both nostrils daily. (Patient taking differently: Place 2 sprays into both nostrils daily as needed for allergies. ), Disp: 16 g, Rfl: 6 .  furosemide (LASIX) 40 MG tablet, Take 1 tablet (40 mg total) by mouth daily. Take extra 40 mg tablet once in the afternoon AS NEEDED for weight gain 3 lbs or more., Disp: 180 tablet, Rfl: 3 .  gabapentin (NEURONTIN) 300 MG capsule, Take 2 capsules (600 mg total) by mouth 3 (three) times daily., Disp: 540 capsule, Rfl: 3 .  loratadine (CLARITIN) 10 MG tablet, Take 1 tablet (10 mg total) by mouth daily., Disp: 30 tablet, Rfl: 11 .  meclizine (ANTIVERT) 25 MG tablet, TAKE ONE TABLET BY MOUTH THREE TIMES DAILY AS NEEDED FOR  DIZZINESS, Disp: 60 tablet, Rfl: 0 .  omeprazole (PRILOSEC) 40 MG capsule, Take 1 capsule (40 mg total) by mouth 2 (two) times daily., Disp: 180 capsule, Rfl: 3 .  potassium chloride (K-DUR,KLOR-CON) 10 MEQ tablet, Take 20 mEq by mouth 2 (two) times daily., Disp: , Rfl:  .  Riociguat (ADEMPAS) 1 MG TABS, Take 1 mg by mouth 3 (three) times daily., Disp:  270 tablet, Rfl: 3 .  rivaroxaban (XARELTO) 20 MG TABS tablet, Take 1 tablet (20 mg total) by mouth daily with supper., Disp: 90 tablet, Rfl: 3 .  spironolactone (ALDACTONE) 25 MG tablet, Take 0.5 tablets (12.5 mg total) by mouth daily., Disp: 45 tablet, Rfl: 3 .  benzonatate (TESSALON) 100 MG capsule, Take 1 capsule (100 mg total) by mouth every 8 (eight) hours. (Patient not taking: Reported on 10/28/2017), Disp: 21 capsule, Rfl: 0 .  cyclobenzaprine (FLEXERIL) 5 MG tablet, Take 1 tablet (5 mg total) by mouth 2 (two)  times daily as needed. for muscle spams (Patient not taking: Reported on 10/28/2017), Disp: 40 tablet, Rfl: 1 No Known Allergies    Social History   Socioeconomic History  . Marital status: Married    Spouse name: Not on file  . Number of children: Not on file  . Years of education: Not on file  . Highest education level: Not on file  Social Needs  . Financial resource strain: Not on file  . Food insecurity - worry: Not on file  . Food insecurity - inability: Not on file  . Transportation needs - medical: Not on file  . Transportation needs - non-medical: Not on file  Occupational History  . Not on file  Tobacco Use  . Smoking status: Never Smoker  . Smokeless tobacco: Never Used  Substance and Sexual Activity  . Alcohol use: No  . Drug use: No  . Sexual activity: Yes    Partners: Male    Birth control/protection: Surgical  Other Topics Concern  . Not on file  Social History Narrative  . Not on file    Physical Exam  Constitutional: She is oriented to person, place, and time.  Cardiovascular: Normal rate and regular rhythm.  Pulmonary/Chest: Effort normal and breath sounds normal. She has no wheezes. She has no rales.  Abdominal: She exhibits no distension.  Musculoskeletal: Normal range of motion. She exhibits edema.  Neurological: She is alert and oriented to person, place, and time.  Skin: Skin is warm and dry.  Psychiatric: She has a normal mood and affect.        Future Appointments  Date Time Provider [place]  10/31/2017  9:00 AM [name] [name], [facility] THN-COM None  11/06/2017 10:15 AM [name] Garfinkel, MD LBPU-PULCARE None  11/07/2017 11:15 AM [name] Pier, MD CHW-CHWW None  11/24/2017 11:00 AM WL-MDCC ROOM WL-MDCC None  12/26/2017 11:00 AM WL-MDCC ROOM WL-MDCC None  01/26/2018  9:00 AM WL-MDCC ROOM WL-MDCC None    BP 116/72 (BP Location: Right Arm, Patient Position: Sitting, Cuff Size: Large)   Pulse 80   Resp 18   Wt (!) 334 lb (151.5 kg)    SpO2 96%   BMI 59.17 kg/m   Weight yesterday- 336 lb Last visit weight- 342 lb  [name] was seen at home today and reported feeling "OK." She was less distraught than last week and when Alexis from [facility] called, she told her that they "didn't have to do that." I spoke with Ubaldo Glassing as that point who advised she would be coming out on Thursday to reassess the situation. Her medications were verified and her  pillbox was filled. She is in need of potassium to last her until [facility] delivers her new medications, which she said was set up last week. I contacted [name] who was going to send a refill to [facility].  Time spent with patient: 40 minutes  [name],  EMT 10/28/17  ACTION: Home visit completed Next visit planned for 1 week

## 2017-10-29 ENCOUNTER — Telehealth: Payer: Self-pay | Admitting: Pharmacist

## 2017-10-29 ENCOUNTER — Other Ambulatory Visit: Payer: Self-pay | Admitting: Internal Medicine

## 2017-10-29 DIAGNOSIS — F329 Major depressive disorder, single episode, unspecified: Secondary | ICD-10-CM | POA: Diagnosis not present

## 2017-10-29 DIAGNOSIS — M069 Rheumatoid arthritis, unspecified: Secondary | ICD-10-CM | POA: Diagnosis not present

## 2017-10-29 DIAGNOSIS — K5793 Diverticulitis of intestine, part unspecified, without perforation or abscess with bleeding: Secondary | ICD-10-CM | POA: Diagnosis not present

## 2017-10-29 DIAGNOSIS — R059 Cough, unspecified: Secondary | ICD-10-CM

## 2017-10-29 DIAGNOSIS — M1990 Unspecified osteoarthritis, unspecified site: Secondary | ICD-10-CM | POA: Diagnosis not present

## 2017-10-29 DIAGNOSIS — R05 Cough: Secondary | ICD-10-CM

## 2017-10-29 DIAGNOSIS — J9610 Chronic respiratory failure, unspecified whether with hypoxia or hypercapnia: Secondary | ICD-10-CM | POA: Diagnosis not present

## 2017-10-29 DIAGNOSIS — I2724 Chronic thromboembolic pulmonary hypertension: Secondary | ICD-10-CM | POA: Diagnosis not present

## 2017-10-29 DIAGNOSIS — G894 Chronic pain syndrome: Secondary | ICD-10-CM | POA: Diagnosis not present

## 2017-10-29 DIAGNOSIS — I10 Essential (primary) hypertension: Secondary | ICD-10-CM | POA: Diagnosis not present

## 2017-10-29 DIAGNOSIS — F419 Anxiety disorder, unspecified: Secondary | ICD-10-CM | POA: Diagnosis not present

## 2017-10-29 MED ORDER — LORATADINE 10 MG PO TABS: 10.0000 mg | ORAL_TABLET | Freq: Every day | ORAL | 2 refills | Status: DC

## 2017-10-29 MED ORDER — POTASSIUM CHLORIDE CRYS ER 20 MEQ PO TBCR: 20.0000 meq | EXTENDED_RELEASE_TABLET | Freq: Two times a day (BID) | ORAL | 3 refills | Status: DC

## 2017-10-29 NOTE — Patient Outreach (Signed)
[place] [facility]) Care Management  10/29/2017  Averyanna Sax [facility] 03/13/1961 [id number]   Patient was called to follow up and because I received an in basket from her provider. HIPAA identifiers were obtained.  Patient confirmed that [facility] called her to schedule delivery of her mail order medications. Patient also said she received her OTC order from [facility].  Patient also inquired about being able to get medication assistance through [place] but they require patient's to spend 4% of their income on medication expenses.  Patient has not spent anywhere close to this yet as the majority of her prescriptions are generic and can be obtained from [place] at no charge.  Patient was instructed to keep all EOBs from [facility] to help keep up with her out-of-pocket spend.   Plan:  Call patient back in 10-14 days to see if she received her meds from [facility]. Close patients case.  [name], PharmD, [place] Clinical Pharmacist [phone]

## 2017-10-29 NOTE — Telephone Encounter (Signed)
-----   Message from Ladell Pier, MD sent at 10/29/2017 10:30 AM EST ----- I did.  She was taking Potassium 10 meq 2 tabs BID.  I changed the rxn to 20 mg 1 tab BID.  If you can let her know, I would appreciate.  Thanks. ----- Message ----- From: [name], [facility] Sent: 10/22/2017  12:41 PM To: Ladell Pier, MD  Dr. [name],  Thank you so much for sending the patient's prescriptions to [facility]. With the exception of Xarelto and her inhaler, her medications will be delivered to her home for a $0 copay!! We had a conference call with the [place] today and the patient's medications will be delivered via [place] [employer] within 3-5 business days.  The patient had her potassium filled at [facility] recently but if you could send a new prescription for potassium and the loratadine to [facility], the patient would be able to get those medications as well at no cost.    While I had the patient on the phone, we were able to determine that she also as an OTC benefit that will provide her with $50 of free OTC products quarterly.  The patient ordered a few items today and a catalog is being sent to her home.  Thank you so much for your time and consideration.  Blessings,  [name], PharmD, [place] Clinical Pharmacist [phone]

## 2017-10-30 ENCOUNTER — Telehealth: Payer: Self-pay | Admitting: Licensed Clinical Social Worker

## 2017-10-30 NOTE — Telephone Encounter (Signed)
CSW attempted to reach patient by phone although patient was not available. CSW left message with family. Raquel Sarna, [place], [place]

## 2017-10-31 ENCOUNTER — Ambulatory Visit: Payer: Self-pay | Admitting: Pharmacist

## 2017-11-04 ENCOUNTER — Other Ambulatory Visit: Payer: Self-pay | Admitting: Internal Medicine

## 2017-11-04 DIAGNOSIS — R05 Cough: Secondary | ICD-10-CM

## 2017-11-04 DIAGNOSIS — R059 Cough, unspecified: Secondary | ICD-10-CM

## 2017-11-05 ENCOUNTER — Other Ambulatory Visit: Payer: Self-pay | Admitting: Internal Medicine

## 2017-11-05 DIAGNOSIS — F419 Anxiety disorder, unspecified: Secondary | ICD-10-CM

## 2017-11-05 DIAGNOSIS — F32A Depression, unspecified: Secondary | ICD-10-CM

## 2017-11-05 DIAGNOSIS — F329 Major depressive disorder, single episode, unspecified: Secondary | ICD-10-CM

## 2017-11-06 ENCOUNTER — Telehealth (HOSPITAL_COMMUNITY): Payer: Self-pay

## 2017-11-06 ENCOUNTER — Ambulatory Visit: Payer: Self-pay | Admitting: Pulmonary Disease

## 2017-11-06 NOTE — Telephone Encounter (Signed)
I called [name] [name] to schedule an appointment. The phone rang once and then went to voicemail. I left a message requesting she call me back so we could schedule her appointment for the week.

## 2017-11-06 NOTE — Telephone Encounter (Signed)
3 page, paperwork received through fax 11-06-17.

## 2017-11-07 ENCOUNTER — Telehealth: Payer: Self-pay | Admitting: Internal Medicine

## 2017-11-07 ENCOUNTER — Other Ambulatory Visit: Payer: Self-pay | Admitting: Pharmacist

## 2017-11-07 ENCOUNTER — Ambulatory Visit: Payer: Self-pay | Admitting: Internal Medicine

## 2017-11-07 ENCOUNTER — Other Ambulatory Visit (HOSPITAL_COMMUNITY): Payer: Self-pay

## 2017-11-07 NOTE — Patient Outreach (Signed)
[place] [facility]) Care Management  11/07/2017  [name] [name] [facility] [birth year] [id number]   Patient was called to follow up on [place] refills. HIPAA identifiers were obtained. Patient confirmed she received her medications from [facility] and is happy with their service.  Patient was also excited about not having a copay for her medications that are in tiers 1&2.  She will continue to get Xarelto filled at [facility] as she cannot afford to get a 3 month supply from [facility].   Patient was educated about the patient assistance program for Xarelto [name] and [name]).  Patient will need to spend 4% of her household income on medications to be eligible for their program. Since the majority of her medications do not have a copay, this may be difficult for her.  Patient was also instructed to keep her EOBs from [facility] because if she does meet the criteria, she will need proof of her out-of-pocket medication expenses.   Plan: Patient's case will be closed for now.  If she gets close to spending 4% of her income in medication expenses, patient was instructed to give [facility] Pharmacist a call.  Patient has [facility] Pharmacist's contact information and understands that she can call at any time in the future with medication related questions or concerns.   [name], PharmD, [place] Clinical Pharmacist [phone]

## 2017-11-07 NOTE — Telephone Encounter (Signed)
Call patient regarding her SCAT application and to find out if they had reached out to her to schedule the assessment. Patient was not available. Left message with family member asking her to return my call at [phone].

## 2017-11-07 NOTE — Progress Notes (Signed)
Paramedicine Encounter    Patient ID: [name] [name], female    DOB: 1961/07/07, 57 y.o.   MRN: [id number]   Patient Care Team: Ladell Pier, MD as PCP - General (Internal Medicine) [name], [facility] as [place] [facility] (Pharmacist)  Patient Active Problem List   Diagnosis Date Noted  . CHF exacerbation ([place]) 08/21/2017  . (HFpEF) heart failure with preserved ejection fraction ([place]) 08/20/2017  . Lumbar radiculopathy 08/08/2017  . Morbid obesity with BMI of 50.0-59.9, adult ([place]) 08/08/2017  . Hoarseness 08/08/2017  . Chronic cough 04/10/2017  . Rheumatoid arthritis involving multiple sites ([place]) 04/10/2017  . Anxiety and depression 04/10/2017  . Adjustment disorder with depressed mood 12/11/2016  . Morbid obesity ([place]) 08/14/2016  . Stroke ([place]) 06/27/2016  . Right sided weakness 06/27/2016  . Essential hypertension 06/27/2016  . Atypical chest pain 06/27/2016  . Difficult airway 03/29/2016  . CTEPH (chronic thromboembolic pulmonary hypertension) ([place]) 03/14/2016  . OSA on CPAP 02/20/2016  . Chronic pain syndrome 01/31/2016  . Pulmonary embolus ([place]) 11/15/2015  . Vertigo 11/15/2015  . Depression 11/15/2015    Current Outpatient Medications:  .  acetaminophen-codeine (TYLENOL #4) 300-60 MG tablet, Take 1 tablet by mouth every 8 (eight) hours as needed for moderate pain (This rxn was not dispensed as I later saw pt still had a RF left on last rxn)., Disp: 90 tablet, Rfl: 2 .  albuterol (PROVENTIL HFA;VENTOLIN HFA) 108 (90 Base) MCG/ACT inhaler, Inhale 1-2 puffs into the lungs every 6 (six) hours as needed for wheezing., Disp: 3 Inhaler, Rfl: 6 .  atorvastatin (LIPITOR) 20 MG tablet, Take 1 tablet (20 mg total) by mouth daily at 6 PM., Disp: 90 tablet, Rfl: 3 .  benzonatate (TESSALON) 100 MG capsule, Take 1 capsule (100 mg total) by mouth every 8 (eight) hours., Disp: 21 capsule, Rfl: 0 .  busPIRone (BUSPAR) 5 MG tablet, Take 1 tablet (5 mg  total) by mouth 2 (two) times daily., Disp: 180 tablet, Rfl: 3 .  cyclobenzaprine (FLEXERIL) 5 MG tablet, Take 1 tablet (5 mg total) by mouth 2 (two) times daily as needed. for muscle spams, Disp: 40 tablet, Rfl: 1 .  docusate sodium (COLACE) 100 MG capsule, Take 1 capsule (100 mg total) by mouth every 12 (twelve) hours., Disp: 30 capsule, Rfl: 0 .  DULoxetine (CYMBALTA) 30 MG capsule, Take 1 capsule (30 mg total) by mouth daily., Disp: 30 capsule, Rfl: 3 .  ferrous sulfate (FERROUSUL) 325 (65 FE) MG tablet, Take 1 tablet (325 mg total) by mouth daily with breakfast., Disp: 90 tablet, Rfl: 3 .  fluticasone (FLONASE) 50 MCG/ACT nasal spray, Place 2 sprays into both nostrils daily. (Patient taking differently: Place 2 sprays into both nostrils daily as needed for allergies. ), Disp: 16 g, Rfl: 6 .  furosemide (LASIX) 40 MG tablet, Take 1 tablet (40 mg total) by mouth daily. Take extra 40 mg tablet once in the afternoon AS NEEDED for weight gain 3 lbs or more., Disp: 180 tablet, Rfl: 3 .  gabapentin (NEURONTIN) 300 MG capsule, Take 2 capsules (600 mg total) by mouth 3 (three) times daily., Disp: 540 capsule, Rfl: 3 .  loratadine (CLARITIN) 10 MG tablet, Take 1 tablet (10 mg total) by mouth daily., Disp: 90 tablet, Rfl: 2 .  meclizine (ANTIVERT) 25 MG tablet, TAKE ONE TABLET BY MOUTH THREE TIMES DAILY AS NEEDED FOR  DIZZINESS, Disp: 60 tablet, Rfl: 0 .  omeprazole (PRILOSEC) 40 MG capsule, Take 1  capsule (40 mg total) by mouth 2 (two) times daily., Disp: 180 capsule, Rfl: 3 .  potassium chloride (K-DUR,KLOR-CON) 20 MEQ tablet, Take 1 tablet (20 mEq total) by mouth 2 (two) times daily., Disp: 180 tablet, Rfl: 3 .  Riociguat (ADEMPAS) 1 MG TABS, Take 1 mg by mouth 3 (three) times daily., Disp: 270 tablet, Rfl: 3 .  rivaroxaban (XARELTO) 20 MG TABS tablet, Take 1 tablet (20 mg total) by mouth daily with supper., Disp: 90 tablet, Rfl: 3 .  spironolactone (ALDACTONE) 25 MG tablet, Take 0.5 tablets (12.5 mg  total) by mouth daily., Disp: 45 tablet, Rfl: 3 No Known Allergies    Social History   Socioeconomic History  . Marital status: Married    Spouse name: Not on file  . Number of children: Not on file  . Years of education: Not on file  . Highest education level: Not on file  Social Needs  . Financial resource strain: Not on file  . Food insecurity - worry: Not on file  . Food insecurity - inability: Not on file  . Transportation needs - medical: Not on file  . Transportation needs - non-medical: Not on file  Occupational History  . Not on file  Tobacco Use  . Smoking status: Never Smoker  . Smokeless tobacco: Never Used  Substance and Sexual Activity  . Alcohol use: No  . Drug use: No  . Sexual activity: Yes    Partners: Male    Birth control/protection: Surgical  Other Topics Concern  . Not on file  Social History Narrative  . Not on file    Physical Exam  Constitutional: She is oriented to person, place, and time.  Cardiovascular: Normal rate and regular rhythm.  Pulmonary/Chest: Effort normal and breath sounds normal. No respiratory distress. She has no wheezes. She has no rales.  Musculoskeletal: Normal range of motion. She exhibits edema.  Neurological: She is alert and oriented to person, place, and time.  Skin: Skin is warm and dry.  Psychiatric: She has a normal mood and affect.        Future Appointments  Date Time Provider [place]  11/24/2017 11:00 AM [facility] ROOM [facility] None  12/26/2017 11:00 AM [facility] ROOM [facility] None  01/26/2018  9:00 AM [facility] ROOM [facility] None    BP 96/64 (BP Location: Left Arm, Patient Position: Sitting, Cuff Size: Large)   Pulse 80   Resp 18   Wt (!) 336 lb (152.4 kg)   SpO2 97%   BMI 59.52 kg/m   Weight yesterday- 334 lb Last visit weight- 334 lb  Ms Tom was seen at home today and reported no change. She has been taking her medications and filled her own pillbox prior to my arrival. She reports she is  still dizzy and she has SOB with exertion. Her weight is down form two weeks ago but she still exhibits edema. No changes appear necessary at this time.  Time spent with patient: 30 minutes  [name], EMT 11/07/17  ACTION: Home visit completed Next visit planned for 1 week

## 2017-11-10 DIAGNOSIS — R0602 Shortness of breath: Secondary | ICD-10-CM | POA: Diagnosis not present

## 2017-11-12 ENCOUNTER — Telehealth: Payer: Self-pay | Admitting: Internal Medicine

## 2017-11-12 ENCOUNTER — Other Ambulatory Visit (HOSPITAL_COMMUNITY): Payer: Self-pay

## 2017-11-12 NOTE — Progress Notes (Signed)
Paramedicine Encounter    Patient ID: [name] [name], female    DOB: July 07, 1961, 57 y.o.   MRN: [id number]   Patient Care Team: [name] Pier, MD as PCP - General (Internal Medicine)  Patient Active Problem List   Diagnosis Date Noted  . CHF exacerbation ([place]) 08/21/2017  . (HFpEF) heart failure with preserved ejection fraction ([place]) 08/20/2017  . Lumbar radiculopathy 08/08/2017  . Morbid obesity with BMI of 50.0-59.9, adult ([place]) 08/08/2017  . Hoarseness 08/08/2017  . Chronic cough 04/10/2017  . Rheumatoid arthritis involving multiple sites ([place]) 04/10/2017  . Anxiety and depression 04/10/2017  . Adjustment disorder with depressed mood 12/11/2016  . Morbid obesity ([place]) 08/14/2016  . Stroke ([place]) 06/27/2016  . Right sided weakness 06/27/2016  . Essential hypertension 06/27/2016  . Atypical chest pain 06/27/2016  . Difficult airway 03/29/2016  . CTEPH (chronic thromboembolic pulmonary hypertension) ([place]) 03/14/2016  . OSA on CPAP 02/20/2016  . Chronic pain syndrome 01/31/2016  . Pulmonary embolus ([place]) 11/15/2015  . Vertigo 11/15/2015  . Depression 11/15/2015    Current Outpatient Medications:  .  acetaminophen-codeine (TYLENOL #4) 300-60 MG tablet, Take 1 tablet by mouth every 8 (eight) hours as needed for moderate pain (This rxn was not dispensed as I later saw pt still had a RF left on last rxn)., Disp: 90 tablet, Rfl: 2 .  albuterol (PROVENTIL HFA;VENTOLIN HFA) 108 (90 Base) MCG/ACT inhaler, Inhale 1-2 puffs into the lungs every 6 (six) hours as needed for wheezing., Disp: 3 Inhaler, Rfl: 6 .  atorvastatin (LIPITOR) 20 MG tablet, Take 1 tablet (20 mg total) by mouth daily at 6 PM., Disp: 90 tablet, Rfl: 3 .  benzonatate (TESSALON) 100 MG capsule, Take 1 capsule (100 mg total) by mouth every 8 (eight) hours., Disp: 21 capsule, Rfl: 0 .  busPIRone (BUSPAR) 5 MG tablet, Take 1 tablet (5 mg total) by mouth 2 (two) times daily., Disp: 180 tablet, Rfl: 3 .   cyclobenzaprine (FLEXERIL) 5 MG tablet, Take 1 tablet (5 mg total) by mouth 2 (two) times daily as needed. for muscle spams, Disp: 40 tablet, Rfl: 1 .  docusate sodium (COLACE) 100 MG capsule, Take 1 capsule (100 mg total) by mouth every 12 (twelve) hours., Disp: 30 capsule, Rfl: 0 .  DULoxetine (CYMBALTA) 30 MG capsule, Take 1 capsule (30 mg total) by mouth daily., Disp: 30 capsule, Rfl: 3 .  fluticasone (FLONASE) 50 MCG/ACT nasal spray, Place 2 sprays into both nostrils daily. (Patient taking differently: Place 2 sprays into both nostrils daily as needed for allergies. ), Disp: 16 g, Rfl: 6 .  furosemide (LASIX) 40 MG tablet, Take 1 tablet (40 mg total) by mouth daily. Take extra 40 mg tablet once in the afternoon AS NEEDED for weight gain 3 lbs or more., Disp: 180 tablet, Rfl: 3 .  gabapentin (NEURONTIN) 300 MG capsule, Take 2 capsules (600 mg total) by mouth 3 (three) times daily., Disp: 540 capsule, Rfl: 3 .  loratadine (CLARITIN) 10 MG tablet, Take 1 tablet (10 mg total) by mouth daily., Disp: 90 tablet, Rfl: 2 .  meclizine (ANTIVERT) 25 MG tablet, TAKE ONE TABLET BY MOUTH THREE TIMES DAILY AS NEEDED FOR  DIZZINESS, Disp: 60 tablet, Rfl: 0 .  omeprazole (PRILOSEC) 40 MG capsule, Take 1 capsule (40 mg total) by mouth 2 (two) times daily., Disp: 180 capsule, Rfl: 3 .  potassium chloride (K-DUR,KLOR-CON) 20 MEQ tablet, Take 1 tablet (20 mEq total) by mouth 2 (two) times daily., Disp: 180  tablet, Rfl: 3 .  Riociguat (ADEMPAS) 1 MG TABS, Take 1 mg by mouth 3 (three) times daily., Disp: 270 tablet, Rfl: 3 .  rivaroxaban (XARELTO) 20 MG TABS tablet, Take 1 tablet (20 mg total) by mouth daily with supper., Disp: 90 tablet, Rfl: 3 .  spironolactone (ALDACTONE) 25 MG tablet, Take 0.5 tablets (12.5 mg total) by mouth daily., Disp: 45 tablet, Rfl: 3 .  ferrous sulfate (FERROUSUL) 325 (65 FE) MG tablet, Take 1 tablet (325 mg total) by mouth daily with breakfast. (Patient not taking: Reported on 11/12/2017),  Disp: 90 tablet, Rfl: 3 No Known Allergies    Social History   Socioeconomic History  . Marital status: Married    Spouse name: Not on file  . Number of children: Not on file  . Years of education: Not on file  . Highest education level: Not on file  Social Needs  . Financial resource strain: Not on file  . Food insecurity - worry: Not on file  . Food insecurity - inability: Not on file  . Transportation needs - medical: Not on file  . Transportation needs - non-medical: Not on file  Occupational History  . Not on file  Tobacco Use  . Smoking status: Never Smoker  . Smokeless tobacco: Never Used  Substance and Sexual Activity  . Alcohol use: No  . Drug use: No  . Sexual activity: Yes    Partners: Male    Birth control/protection: Surgical  Other Topics Concern  . Not on file  Social History Narrative  . Not on file    Physical Exam  Constitutional: She is oriented to person, place, and time.  Cardiovascular: Normal rate and regular rhythm.  Pulmonary/Chest: Effort normal and breath sounds normal. No respiratory distress. She has no wheezes. She has no rales.  Abdominal: Soft.  Musculoskeletal: Normal range of motion. She exhibits no edema.  Neurological: She is alert and oriented to person, place, and time.  Skin: Skin is warm and dry.  Psychiatric: She has a normal mood and affect.        Future Appointments  Date Time Provider [place]  11/24/2017 11:00 AM [facility] ROOM [facility] None  12/26/2017 11:00 AM [facility] ROOM [facility] None  01/26/2018  9:00 AM [facility] ROOM [facility] None    BP 122/74 (BP Location: Left Arm, Patient Position: Sitting, Cuff Size: Large)   Pulse 68   Resp 16   Wt (!) 336 lb (152.4 kg)   SpO2 98%   BMI 59.52 kg/m   Weight yesterday- 336 lb Last visit weight- 336 lb  Ms [name] [name] was seen at home today. She denied any change in her her status and said she is still having intermittent, sharp, chest pains which last "about 10  seconds." These are the same pains which she has been having for a long time and have been evaluated and concluded to not be cardiac related. She reported being compliant with her medications which were verified and her pillbox was refilled.   Time spent with patient: 38 minutes  [name], EMT 11/12/17  ACTION: Home visit completed Next visit planned for 1 week

## 2017-11-12 NOTE — Telephone Encounter (Signed)
Requested to change the loratadine (CLARITIN) 10 MG tablet [[id number]]  To Levocetirizne 5mg  due to not being covered through her insurance. Please fu at your earliest convenience.

## 2017-11-13 DIAGNOSIS — I272 Pulmonary hypertension, unspecified: Secondary | ICD-10-CM | POA: Diagnosis not present

## 2017-11-13 DIAGNOSIS — R0602 Shortness of breath: Secondary | ICD-10-CM | POA: Diagnosis not present

## 2017-11-13 DIAGNOSIS — G4733 Obstructive sleep apnea (adult) (pediatric): Secondary | ICD-10-CM | POA: Diagnosis not present

## 2017-11-13 DIAGNOSIS — I2724 Chronic thromboembolic pulmonary hypertension: Secondary | ICD-10-CM | POA: Diagnosis not present

## 2017-11-14 NOTE — Telephone Encounter (Signed)
Contacted pt to see if she would like to switch to xyzal spoke with pt husband and he tried to give her the phone but hung up

## 2017-11-17 ENCOUNTER — Telehealth (HOSPITAL_COMMUNITY): Payer: Self-pay

## 2017-11-20 ENCOUNTER — Telehealth (HOSPITAL_COMMUNITY): Payer: Self-pay | Admitting: Pharmacist

## 2017-11-20 NOTE — Telephone Encounter (Signed)
Call patient [phone], regarding her SCAT application. Spoke with patient's husband and he informed me that patient got approved for it.

## 2017-11-20 NOTE — Telephone Encounter (Signed)
Adempas support program sent [place] an update on Ms. Plemmons's recent nursing visit on 11/18/17. BP was 155/75 mmHg. Patient's dose of Adempas was continued at 2 mg PO TID.   Ruta Hinds. Velva Harman, PharmD, BCPS, CPP Clinical Pharmacist Phone: [phone] 11/20/2017 2:17 PM

## 2017-11-21 ENCOUNTER — Telehealth (HOSPITAL_COMMUNITY): Payer: Self-pay

## 2017-11-21 NOTE — Telephone Encounter (Signed)
I called [name] [name] today to see if I could come see her this evening. She said she was about to leave the house for a dinner with her friends. We agreed to get together next week.

## 2017-11-21 NOTE — Telephone Encounter (Signed)
I called [name] [name] to schedule an appointment. She did not answer and no voicemail was left.

## 2017-11-24 ENCOUNTER — Encounter (HOSPITAL_COMMUNITY): Payer: Self-pay

## 2017-11-24 ENCOUNTER — Encounter (HOSPITAL_COMMUNITY)
Admission: RE | Admit: 2017-11-24 | Discharge: 2017-11-24 | Disposition: A | Discharge status: Home / Self Care | Payer: Medicare HMO | Source: Ambulatory Visit | Attending: Internal Medicine | Admitting: Internal Medicine

## 2017-11-24 DIAGNOSIS — Z452 Encounter for adjustment and management of vascular access device: Secondary | ICD-10-CM | POA: Diagnosis not present

## 2017-11-24 MED ORDER — HEPARIN SOD (PORK) LOCK FLUSH 100 UNIT/ML IV SOLN
INTRAVENOUS | Status: AC
  Administered 2017-11-24: 500 [IU] via INTRAVENOUS
  Filled 2017-11-24: qty 5

## 2017-11-24 MED ORDER — SODIUM CHLORIDE 0.9% FLUSH
10.0000 mL | INTRAVENOUS | Status: DC
  Administered 2017-11-24: 10 mL via INTRAVENOUS

## 2017-11-24 MED ORDER — SODIUM CHLORIDE FLUSH 0.9 % IV SOLN
INTRAVENOUS | Status: AC
  Administered 2017-11-24: 10 mL via INTRAVENOUS
  Filled 2017-11-24: qty 10

## 2017-11-24 MED ORDER — HEPARIN SOD (PORK) LOCK FLUSH 100 UNIT/ML IV SOLN
500.0000 [IU] | INTRAVENOUS | Status: DC
  Administered 2017-11-24: 500 [IU] via INTRAVENOUS

## 2017-11-28 ENCOUNTER — Other Ambulatory Visit (HOSPITAL_COMMUNITY): Payer: Self-pay

## 2017-11-28 NOTE — Progress Notes (Signed)
Paramedicine Encounter    Patient ID: [name] [name], female    DOB: 1961-03-20, 57 y.o.   MRN: [id number]   Patient Care Team: Ladell Pier, MD as PCP - General (Internal Medicine)  Patient Active Problem List   Diagnosis Date Noted  . CHF exacerbation ([place]) 08/21/2017  . (HFpEF) heart failure with preserved ejection fraction ([place]) 08/20/2017  . Lumbar radiculopathy 08/08/2017  . Morbid obesity with BMI of 50.0-59.9, adult ([place]) 08/08/2017  . Hoarseness 08/08/2017  . Chronic cough 04/10/2017  . Rheumatoid arthritis involving multiple sites ([place]) 04/10/2017  . Anxiety and depression 04/10/2017  . Adjustment disorder with depressed mood 12/11/2016  . Morbid obesity ([place]) 08/14/2016  . Stroke ([place]) 06/27/2016  . Right sided weakness 06/27/2016  . Essential hypertension 06/27/2016  . Atypical chest pain 06/27/2016  . Difficult airway 03/29/2016  . CTEPH (chronic thromboembolic pulmonary hypertension) ([place]) 03/14/2016  . OSA on CPAP 02/20/2016  . Chronic pain syndrome 01/31/2016  . Pulmonary embolus ([place]) 11/15/2015  . Vertigo 11/15/2015  . Depression 11/15/2015    Current Outpatient Medications:  .  acetaminophen-codeine (TYLENOL #4) 300-60 MG tablet, Take 1 tablet by mouth every 8 (eight) hours as needed for moderate pain (This rxn was not dispensed as I later saw pt still had a RF left on last rxn)., Disp: 90 tablet, Rfl: 2 .  albuterol (PROVENTIL HFA;VENTOLIN HFA) 108 (90 Base) MCG/ACT inhaler, Inhale 1-2 puffs into the lungs every 6 (six) hours as needed for wheezing., Disp: 3 Inhaler, Rfl: 6 .  atorvastatin (LIPITOR) 20 MG tablet, Take 1 tablet (20 mg total) by mouth daily at 6 PM., Disp: 90 tablet, Rfl: 3 .  benzonatate (TESSALON) 100 MG capsule, Take 1 capsule (100 mg total) by mouth every 8 (eight) hours., Disp: 21 capsule, Rfl: 0 .  busPIRone (BUSPAR) 5 MG tablet, Take 1 tablet (5 mg total) by mouth 2 (two) times daily., Disp: 180 tablet, Rfl: 3 .   cyclobenzaprine (FLEXERIL) 5 MG tablet, Take 1 tablet (5 mg total) by mouth 2 (two) times daily as needed. for muscle spams, Disp: 40 tablet, Rfl: 1 .  docusate sodium (COLACE) 100 MG capsule, Take 1 capsule (100 mg total) by mouth every 12 (twelve) hours., Disp: 30 capsule, Rfl: 0 .  DULoxetine (CYMBALTA) 30 MG capsule, Take 1 capsule (30 mg total) by mouth daily., Disp: 30 capsule, Rfl: 3 .  ferrous sulfate (FERROUSUL) 325 (65 FE) MG tablet, Take 1 tablet (325 mg total) by mouth daily with breakfast. (Patient not taking: Reported on 11/12/2017), Disp: 90 tablet, Rfl: 3 .  fluticasone (FLONASE) 50 MCG/ACT nasal spray, Place 2 sprays into both nostrils daily. (Patient taking differently: Place 2 sprays into both nostrils daily as needed for allergies. ), Disp: 16 g, Rfl: 6 .  furosemide (LASIX) 40 MG tablet, Take 1 tablet (40 mg total) by mouth daily. Take extra 40 mg tablet once in the afternoon AS NEEDED for weight gain 3 lbs or more., Disp: 180 tablet, Rfl: 3 .  gabapentin (NEURONTIN) 300 MG capsule, Take 2 capsules (600 mg total) by mouth 3 (three) times daily., Disp: 540 capsule, Rfl: 3 .  loratadine (CLARITIN) 10 MG tablet, Take 1 tablet (10 mg total) by mouth daily., Disp: 90 tablet, Rfl: 2 .  meclizine (ANTIVERT) 25 MG tablet, TAKE ONE TABLET BY MOUTH THREE TIMES DAILY AS NEEDED FOR  DIZZINESS, Disp: 60 tablet, Rfl: 0 .  omeprazole (PRILOSEC) 40 MG capsule, Take 1 capsule (40 mg total) by  mouth 2 (two) times daily., Disp: 180 capsule, Rfl: 3 .  potassium chloride (K-DUR,KLOR-CON) 20 MEQ tablet, Take 1 tablet (20 mEq total) by mouth 2 (two) times daily., Disp: 180 tablet, Rfl: 3 .  Riociguat (ADEMPAS) 2 MG TABS, Take 2 mg by mouth 3 (three) times daily., Disp: , Rfl:  .  rivaroxaban (XARELTO) 20 MG TABS tablet, Take 1 tablet (20 mg total) by mouth daily with supper., Disp: 90 tablet, Rfl: 3 .  spironolactone (ALDACTONE) 25 MG tablet, Take 0.5 tablets (12.5 mg total) by mouth daily., Disp: 45  tablet, Rfl: 3 No Known Allergies    Social History   Socioeconomic History  . Marital status: Married    Spouse name: Not on file  . Number of children: Not on file  . Years of education: Not on file  . Highest education level: Not on file  Occupational History  . Not on file  Social Needs  . Financial resource strain: Not on file  . Food insecurity:    Worry: Not on file    Inability: Not on file  . Transportation needs:    Medical: Not on file    Non-medical: Not on file  Tobacco Use  . Smoking status: Never Smoker  . Smokeless tobacco: Never Used  Substance and Sexual Activity  . Alcohol use: No  . Drug use: No  . Sexual activity: Yes    Partners: Male    Birth control/protection: Surgical  Lifestyle  . Physical activity:    Days per week: Not on file    Minutes per session: Not on file  . Stress: Not on file  Relationships  . Social connections:    Talks on phone: Not on file    Gets together: Not on file    Attends religious service: Not on file    Active member of club or organization: Not on file    Attends meetings of clubs or organizations: Not on file    Relationship status: Not on file  . Intimate partner violence:    Fear of current or ex partner: Not on file    Emotionally abused: Not on file    Physically abused: Not on file    Forced sexual activity: Not on file  Other Topics Concern  . Not on file  Social History Narrative  . Not on file    Physical Exam  Constitutional: She is oriented to person, place, and time.  Cardiovascular: Normal rate and regular rhythm.  Pulmonary/Chest: Effort normal and breath sounds normal. No respiratory distress. She has no wheezes. She has no rales.  Abdominal: Soft.  Musculoskeletal: Normal range of motion. She exhibits no edema.  Neurological: She is alert and oriented to person, place, and time.  Skin: Skin is warm and dry.  Psychiatric: She has a normal mood and affect.        Future  Appointments  Date Time Provider [place]  12/26/2017 11:00 AM [facility] ROOM [facility] None  01/26/2018  9:00 AM [facility] ROOM [facility] None    BP 122/84 (BP Location: Left Arm, Patient Position: Sitting, Cuff Size: Large)   Pulse 90   Resp 16   Wt (!) 335 lb (152 kg)   SpO2 97%   BMI 59.34 kg/m   Weight yesterday- 334 lb Last visit weight- 336 lb  Ms Cornacchia was seen at home today and reported feeling well. She denied SOB, headache or orthopnea. She is still having intermittent dizziness and sharp chest pains. She  reported having an episode yesterday when she was not speaking clearly and could not focus that lasted about 10 minutes. She said the symptoms resolved when she sat still, closed her eyes and relaxed. To hear her describe it further, it seems like she is having anxiety attacks because she describes sensations of feeling overwhelmed. She began crying and said she is having a hard time right now because her daughter's birthday is coming up and she is no longer alive. She is also upset because she cannot afford to havethe weight loss surgery she wants so I provided her with some phone numbers to call to try and find other options for surgery. I also got her information to get [facility] into the [facility] program because she reports he is still having behavior problems. Lastly I urged her to try to find a mental health counselor because I think she would benefit from talking to a specialist. At present, I believe I am the only person who she is sharing her burdens with and I am not the best qualified to help her.   Time spent with patient: 39 minutes  [name], EMT 11/28/17  ACTION: Home visit completed Next visit planned for 1 week

## 2017-12-01 ENCOUNTER — Telehealth: Payer: Self-pay | Admitting: Internal Medicine

## 2017-12-01 DIAGNOSIS — M5416 Radiculopathy, lumbar region: Secondary | ICD-10-CM

## 2017-12-01 NOTE — Telephone Encounter (Signed)
Patient called requesting a refill on tylenol #4 and flexeril. Please send this medication to [facility].

## 2017-12-02 ENCOUNTER — Other Ambulatory Visit: Payer: Self-pay | Admitting: Family Medicine

## 2017-12-02 DIAGNOSIS — M5416 Radiculopathy, lumbar region: Secondary | ICD-10-CM

## 2017-12-02 MED ORDER — ACETAMINOPHEN-CODEINE #4 300-60 MG PO TABS: 1.0000 | ORAL_TABLET | Freq: Three times a day (TID) | ORAL | 0 refills | Status: DC | PRN

## 2017-12-02 NOTE — Telephone Encounter (Signed)
Refilled

## 2017-12-02 NOTE — Telephone Encounter (Signed)
Called and spoke with the patient's husband and informed him that the patients medication was sent to the pharmacy

## 2017-12-03 ENCOUNTER — Telehealth: Payer: Self-pay | Admitting: Licensed Clinical Social Worker

## 2017-12-03 NOTE — Telephone Encounter (Signed)
CSW referred to assist with resources for counseling in the community. Patient answered the phone and states she has been delaying calling as she was unsure where to start. CSW provided a few options in the community for evaluation and counseling. Patient grateful for the information and states she will follow up. CSW offered further assistance if needed. Raquel Sarna, [place], [place]

## 2017-12-05 ENCOUNTER — Emergency Department (HOSPITAL_COMMUNITY): Payer: Medicare HMO

## 2017-12-05 ENCOUNTER — Ambulatory Visit (HOSPITAL_COMMUNITY)
Admission: RE | Admit: 2017-12-05 | Discharge: 2017-12-05 | Disposition: A | Discharge status: Home / Self Care | Payer: Medicare HMO | Source: Home / Self Care | Attending: Psychiatry | Admitting: Psychiatry

## 2017-12-05 ENCOUNTER — Telehealth (HOSPITAL_COMMUNITY): Payer: Self-pay | Admitting: *Deleted

## 2017-12-05 ENCOUNTER — Other Ambulatory Visit: Payer: Self-pay

## 2017-12-05 ENCOUNTER — Emergency Department (HOSPITAL_COMMUNITY)
Admission: EM | Admit: 2017-12-05 | Discharge: 2017-12-07 | Disposition: A | Discharge status: Other Acute Inpatient Hospital | Payer: Medicare HMO | Attending: Emergency Medicine | Admitting: Emergency Medicine

## 2017-12-05 ENCOUNTER — Telehealth (HOSPITAL_COMMUNITY): Payer: Self-pay

## 2017-12-05 DIAGNOSIS — I509 Heart failure, unspecified: Secondary | ICD-10-CM | POA: Insufficient documentation

## 2017-12-05 DIAGNOSIS — F332 Major depressive disorder, recurrent severe without psychotic features: Secondary | ICD-10-CM | POA: Diagnosis present

## 2017-12-05 DIAGNOSIS — Z1330 Encounter for screening examination for mental health and behavioral disorders, unspecified: Secondary | ICD-10-CM | POA: Insufficient documentation

## 2017-12-05 DIAGNOSIS — R45851 Suicidal ideations: Secondary | ICD-10-CM

## 2017-12-05 DIAGNOSIS — I110 Hypertensive heart disease with heart failure: Secondary | ICD-10-CM | POA: Insufficient documentation

## 2017-12-05 DIAGNOSIS — E669 Obesity, unspecified: Secondary | ICD-10-CM | POA: Insufficient documentation

## 2017-12-05 DIAGNOSIS — M545 Low back pain: Secondary | ICD-10-CM | POA: Diagnosis not present

## 2017-12-05 DIAGNOSIS — F419 Anxiety disorder, unspecified: Secondary | ICD-10-CM | POA: Diagnosis not present

## 2017-12-05 DIAGNOSIS — F329 Major depressive disorder, single episode, unspecified: Secondary | ICD-10-CM | POA: Diagnosis not present

## 2017-12-05 DIAGNOSIS — E785 Hyperlipidemia, unspecified: Secondary | ICD-10-CM | POA: Insufficient documentation

## 2017-12-05 DIAGNOSIS — R0902 Hypoxemia: Secondary | ICD-10-CM | POA: Diagnosis not present

## 2017-12-05 DIAGNOSIS — R451 Restlessness and agitation: Secondary | ICD-10-CM | POA: Diagnosis not present

## 2017-12-05 DIAGNOSIS — Z818 Family history of other mental and behavioral disorders: Secondary | ICD-10-CM | POA: Diagnosis not present

## 2017-12-05 DIAGNOSIS — Z86711 Personal history of pulmonary embolism: Secondary | ICD-10-CM | POA: Insufficient documentation

## 2017-12-05 DIAGNOSIS — Z79899 Other long term (current) drug therapy: Secondary | ICD-10-CM | POA: Diagnosis not present

## 2017-12-05 DIAGNOSIS — Z810 Family history of intellectual disabilities: Secondary | ICD-10-CM | POA: Diagnosis not present

## 2017-12-05 DIAGNOSIS — R450 Nervousness: Secondary | ICD-10-CM | POA: Diagnosis not present

## 2017-12-05 DIAGNOSIS — Z813 Family history of other psychoactive substance abuse and dependence: Secondary | ICD-10-CM | POA: Diagnosis not present

## 2017-12-05 DIAGNOSIS — R0602 Shortness of breath: Secondary | ICD-10-CM | POA: Diagnosis not present

## 2017-12-05 LAB — ACETAMINOPHEN LEVEL: Acetaminophen (Tylenol), Serum: <10 ug/mL — ABNORMAL LOW (ref 10–30)

## 2017-12-05 LAB — COMPREHENSIVE METABOLIC PANEL
ALT: 15 U/L (ref 14–54)
AST: 16 U/L (ref 15–41)
Albumin: 4.3 g/dL (ref 3.5–5.0)
Alkaline Phosphatase: 83 U/L (ref 38–126)
Anion gap: 10 (ref 5–15)
BUN: 10 mg/dL (ref 6–20)
CO2: 28 mmol/L (ref 22–32)
Calcium: 9.9 mg/dL (ref 8.9–10.3)
Chloride: 106 mmol/L (ref 101–111)
Creatinine, Ser: 0.83 mg/dL (ref 0.44–1.00)
GFR calc Af Amer: >60 mL/min (ref >60)
GFR calc non Af Amer: >60 mL/min (ref >60)
Glucose, Bld: 97 mg/dL (ref 65–99)
Potassium: 3.9 mmol/L (ref 3.5–5.1)
Sodium: 144 mmol/L (ref 135–145)
Total Bilirubin: 0.5 mg/dL (ref 0.3–1.2)
Total Protein: 7.8 g/dL (ref 6.5–8.1)

## 2017-12-05 LAB — CBC
HCT: 40.1 % (ref 36.0–46.0)
Hemoglobin: 12.7 g/dL (ref 12.0–15.0)
MCH: 20.9 pg — ABNORMAL LOW (ref 26.0–34.0)
MCHC: 31.7 g/dL (ref 30.0–36.0)
MCV: 66.1 fL — ABNORMAL LOW (ref 78.0–100.0)
Platelets: 329 10*3/uL (ref 150–400)
RBC: 6.07 MIL/uL — ABNORMAL HIGH (ref 3.87–5.11)
RDW: 18.2 % — ABNORMAL HIGH (ref 11.5–15.5)
WBC: 7.4 10*3/uL (ref 4.0–10.5)

## 2017-12-05 LAB — ETHANOL: Alcohol, Ethyl (B): <10 mg/dL (ref <10)

## 2017-12-05 LAB — RAPID URINE DRUG SCREEN, HOSP PERFORMED
Amphetamines: NOT DETECTED
Barbiturates: NOT DETECTED
Benzodiazepines: NOT DETECTED
Cocaine: NOT DETECTED
Opiates: NOT DETECTED
Tetrahydrocannabinol: NOT DETECTED

## 2017-12-05 LAB — SALICYLATE LEVEL: Salicylate Lvl: <7 mg/dL (ref 2.8–30.0)

## 2017-12-05 MED ORDER — POTASSIUM CHLORIDE CRYS ER 20 MEQ PO TBCR
20.0000 meq | EXTENDED_RELEASE_TABLET | Freq: Two times a day (BID) | ORAL | Status: DC
Start: 2017-12-05 — End: 2017-12-07
  Administered 2017-12-05 – 2017-12-07 (×4): 20 meq via ORAL
  Filled 2017-12-05 (×4): qty 1

## 2017-12-05 MED ORDER — LORATADINE 10 MG PO TABS
10.0000 mg | ORAL_TABLET | Freq: Every day | ORAL | Status: DC
  Administered 2017-12-06 – 2017-12-07 (×2): 10 mg via ORAL
  Filled 2017-12-05 (×2): qty 1

## 2017-12-05 MED ORDER — SPIRONOLACTONE 12.5 MG HALF TABLET
12.5000 mg | ORAL_TABLET | Freq: Every day | ORAL | Status: DC
  Administered 2017-12-06 – 2017-12-07 (×2): 12.5 mg via ORAL
  Filled 2017-12-05 (×2): qty 1

## 2017-12-05 MED ORDER — BUSPIRONE HCL 10 MG PO TABS
5.0000 mg | ORAL_TABLET | Freq: Two times a day (BID) | ORAL | Status: DC
  Administered 2017-12-05 – 2017-12-06 (×2): 5 mg via ORAL
  Filled 2017-12-05 (×2): qty 1

## 2017-12-05 MED ORDER — ALBUTEROL SULFATE HFA 108 (90 BASE) MCG/ACT IN AERS: 1.0000 | INHALATION_SPRAY | Freq: Four times a day (QID) | RESPIRATORY_TRACT | Status: DC | PRN

## 2017-12-05 MED ORDER — ATORVASTATIN CALCIUM 20 MG PO TABS
20.0000 mg | ORAL_TABLET | Freq: Every day | ORAL | Status: DC
  Administered 2017-12-06: 20 mg via ORAL
  Filled 2017-12-05 (×2): qty 1

## 2017-12-05 MED ORDER — PANTOPRAZOLE SODIUM 40 MG PO TBEC
40.0000 mg | DELAYED_RELEASE_TABLET | Freq: Every day | ORAL | Status: DC
  Administered 2017-12-05 – 2017-12-07 (×3): 40 mg via ORAL
  Filled 2017-12-05 (×3): qty 1

## 2017-12-05 MED ORDER — RIOCIGUAT 2 MG PO TABS
2.0000 mg | ORAL_TABLET | Freq: Three times a day (TID) | ORAL | Status: DC
  Administered 2017-12-06: 2 mg via ORAL
  Administered 2017-12-06: 1 mg via ORAL

## 2017-12-05 MED ORDER — DOCUSATE SODIUM 100 MG PO CAPS
100.0000 mg | ORAL_CAPSULE | Freq: Two times a day (BID) | ORAL | Status: DC
  Administered 2017-12-05 – 2017-12-07 (×4): 100 mg via ORAL
  Filled 2017-12-05 (×4): qty 1

## 2017-12-05 MED ORDER — FERROUS SULFATE 325 (65 FE) MG PO TABS
325.0000 mg | ORAL_TABLET | Freq: Every day | ORAL | Status: DC
  Administered 2017-12-06 – 2017-12-07 (×2): 325 mg via ORAL
  Filled 2017-12-05 (×3): qty 1

## 2017-12-05 MED ORDER — DULOXETINE HCL 30 MG PO CPEP
30.0000 mg | ORAL_CAPSULE | Freq: Every day | ORAL | Status: DC
  Administered 2017-12-05 – 2017-12-06 (×2): 30 mg via ORAL
  Filled 2017-12-05 (×2): qty 1

## 2017-12-05 MED ORDER — FUROSEMIDE 40 MG PO TABS
40.0000 mg | ORAL_TABLET | Freq: Every day | ORAL | Status: DC
  Administered 2017-12-06 – 2017-12-07 (×2): 40 mg via ORAL
  Filled 2017-12-05 (×2): qty 1

## 2017-12-05 MED ORDER — ACETAMINOPHEN-CODEINE #4 300-60 MG PO TABS
1.0000 | ORAL_TABLET | Freq: Three times a day (TID) | ORAL | Status: DC | PRN
  Administered 2017-12-06 – 2017-12-07 (×3): 1 via ORAL
  Filled 2017-12-05 (×3): qty 1

## 2017-12-05 MED ORDER — BENZONATATE 100 MG PO CAPS
100.0000 mg | ORAL_CAPSULE | Freq: Three times a day (TID) | ORAL | Status: DC
  Administered 2017-12-05 – 2017-12-07 (×5): 100 mg via ORAL
  Filled 2017-12-05 (×6): qty 1

## 2017-12-05 MED ORDER — GABAPENTIN 300 MG PO CAPS
600.0000 mg | ORAL_CAPSULE | Freq: Three times a day (TID) | ORAL | Status: DC
  Administered 2017-12-05 – 2017-12-07 (×5): 600 mg via ORAL
  Filled 2017-12-05 (×5): qty 2

## 2017-12-05 MED ORDER — RIVAROXABAN 20 MG PO TABS
20.0000 mg | ORAL_TABLET | Freq: Every day | ORAL | Status: DC
  Administered 2017-12-06: 20 mg via ORAL
  Filled 2017-12-05 (×2): qty 1

## 2017-12-05 MED ORDER — DIAZEPAM 2 MG PO TABS
2.0000 mg | ORAL_TABLET | Freq: Once | ORAL | Status: AC
  Administered 2017-12-05: 2 mg via ORAL
  Filled 2017-12-05: qty 1

## 2017-12-05 MED ORDER — FLUTICASONE PROPIONATE 50 MCG/ACT NA SUSP
2.0000 | Freq: Every day | NASAL | Status: DC | PRN
Start: 2017-12-05 — End: 2017-12-07
  Filled 2017-12-05: qty 16

## 2017-12-05 MED ORDER — MECLIZINE HCL 25 MG PO TABS
25.0000 mg | ORAL_TABLET | Freq: Two times a day (BID) | ORAL | Status: DC | PRN
  Filled 2017-12-05: qty 1

## 2017-12-05 MED ORDER — CYCLOBENZAPRINE HCL 10 MG PO TABS
5.0000 mg | ORAL_TABLET | Freq: Three times a day (TID) | ORAL | Status: DC | PRN
  Administered 2017-12-06: 5 mg via ORAL
  Filled 2017-12-05: qty 1

## 2017-12-05 NOTE — Telephone Encounter (Signed)
[name] [name] called me this morning very upset because of a death in her family and stating that "something is wrong" and "she needs help but can't get any." I spoke to her about options for assistance and told her that we would work on a plan to get her to a counselor. She stated she would try to lay down and get some rest until I arrived.

## 2017-12-05 NOTE — Telephone Encounter (Signed)
I called Ms Casebeer to let her know I was on the way. She stated that she is still upset and needs help so she is about to go to [facility]. She requested I pick up samples of Eliquis because she had been out for a week. She did not report being out of any medications last week when I asked about them. Her medications were picked up and delivered to her house and given to her niece who was at home. I will follow up next week.

## 2017-12-05 NOTE — Telephone Encounter (Signed)
Patient has been out of of xarelto for 2 weeks now, states she is unable to afford it at this time.  Asked for samples.  Patient provided with the following samples.  Samples left for pickup at front desk.   Medication Samples have been provided to the patient.  Drug name: Xarelto    Strength: 20 mg       Qty: 4  LOT: [id number] Exp.Date: 3/21  Dosing instructions: Take one table with supper daily  The patient has been instructed regarding the correct time, dose, and frequency of taking this medication, including desired effects and most common side effects.   Darron Doom 1:51 PM 12/05/2017

## 2017-12-05 NOTE — BH Assessment (Signed)
Assessment Note  [name] [name] is an 57 y.o. female came to [facility] as a walk-in with complaints of worsening depression and suicidal thoughts with a plan to overdose. Patient report years of depressive symptoms with worsening symptoms along with suicidal thoughts past 2 or 3 weeks. Triggers include terminal illness of heart failure, HBP,chronic pain, recent death Dec 23, 2022) of family member, PTSD, grandson suffering with mental illness, mother who recently had a stroke living with patient, guilt and complicated bereavement. Patient has thoughts,"what would life be like if I was not here anymore." Patient very tearful during assignment, poor eye contact, negative intrusive thoughts and responding both internal and external stimuli. Patient denies HI and AVH.   Diagnosis:  F33.2    Major depressive disorder, Recurrent episode, Severe  Past Medical History:  Past Medical History:  Diagnosis Date  . Arthritis   . CHF exacerbation ([place]) 08/21/2017  . Depression   . Diverticulitis   . Hyperlipidemia   . Hypertension   . Obesity   . Pneumonia   . Pulmonary embolism [facility])     Past Surgical History:  Procedure Laterality Date  . ABDOMINAL HYSTERECTOMY    . BREAST REDUCTION SURGERY    . CHOLECYSTECTOMY    . EMBOLECTOMY N/A    pulmonary embolectomy  . REDUCTION MAMMAPLASTY Bilateral   . RIGHT HEART CATH N/A 10/28/2016   Procedure: Right Heart Cath;  Surgeon: Jolaine Artist, MD;  Location: [place] CV LAB;  Service: Cardiovascular;  Laterality: N/A;  . RIGHT HEART CATH N/A 08/26/2017   Procedure: RIGHT HEART CATH;  Surgeon: Jolaine Artist, MD;  Location: [place] CV LAB;  Service: Cardiovascular;  Laterality: N/A;  . ULTRASOUND GUIDANCE FOR VASCULAR ACCESS  08/26/2017   Procedure: Ultrasound Guidance For Vascular Access;  Surgeon: Jolaine Artist, MD;  Location: [place] CV LAB;  Service: Cardiovascular;;    Family History:  Family History  Problem Relation Age of  Onset  . Lung cancer Maternal Grandmother   . Dementia Mother   . Dementia Father   . Anxiety disorder Sister   . Bipolar disorder Sister   . Drug abuse Sister   . Sexual abuse Maternal Aunt   . Anxiety disorder Cousin   . Anxiety disorder Sister   . Bipolar disorder Sister   . Drug abuse Sister   . Breast cancer Neg Hx     Social History:  reports that she has never smoked. She has never used smokeless tobacco. She reports that she does not drink alcohol or use drugs.  Additional Social History:  Alcohol / Drug Use Pain Medications: see MAR Prescriptions: see MAR Over the Counter: see MAR History of alcohol / drug use?: No history of alcohol / drug abuse Longest period of sobriety (when/how long): n/a  CIWA: CIWA-Ar BP: (!) 148/67 Pulse Rate: 86 COWS:    Allergies: No Known Allergies  Home Medications:  (Not in a hospital admission)  OB/GYN Status:  No LMP recorded. Patient has had a hysterectomy.  General Assessment Data Location of Assessment: [facility] Services TTS Assessment: In system Is this a Tele or Face-to-Face Assessment?: Face-to-Face Is this an Initial Assessment or a Re-assessment for this encounter?: Initial Assessment Marital status: Married [place] name: Owens Shark Is patient pregnant?: No Pregnancy Status: No Living Arrangements: Spouse/significant other, Other relatives Can pt return to current living arrangement?: Yes Admission Status: Voluntary Is patient capable of signing voluntary admission?: Yes Referral Source: Self/Family/Friend Insurance type: [employer]  Medical Screening Exam (  [place]) Medical Exam completed: Yes  Crisis Care Plan Living Arrangements: Spouse/significant other, Other relatives Legal Guardian: Other:(self) Name of Psychiatrist: none report Name of Therapist: none report  Education Status Is patient currently in school?: No Is the patient employed, unemployed or receiving disability?:  Unemployed  Risk to self with the past 6 months Suicidal Ideation: Yes-Currently Present Has patient been a risk to self within the past 6 months prior to admission? : No Suicidal Intent: No Has patient had any suicidal intent within the past 6 months prior to admission? : No Is patient at risk for suicide?: Yes Suicidal Plan?: Yes-Currently Present(SI w/to overdose) Has patient had any suicidal plan within the past 6 months prior to admission? : No Specify Current Suicidal Plan: overdose Access to Means: Yes Specify Access to Suicidal Means: pt is on multiple medications What has been your use of drugs/alcohol within the last 12 months?: none report Previous Attempts/Gestures: Yes How many times?: 1(pt report when she was 57 years old) Other Self Harm Risks: none report  Triggers for Past Attempts: Other (Comment)(terminal medical conditions, famiy stress, recent death ) Intentional Self Injurious Behavior: None Family Suicide History: Yes(uncle) Recent stressful life event(s): Loss (Comment), Other (Comment)(aunt passed away, mother medical condition, grandson MH ) Persecutory voices/beliefs?: No Depression: Yes Depression Symptoms: Despondent, Insomnia, Tearfulness, Isolating, Fatigue, Guilt, Loss of interest in usual pleasures, Feeling worthless/self pity Substance abuse history and/or treatment for substance abuse?: No Suicide prevention information given to non-admitted patients: Not applicable  Risk to Others within the past 6 months Homicidal Ideation: No Does patient have any lifetime risk of violence toward others beyond the six months prior to admission? : No Thoughts of Harm to Others: No Current Homicidal Intent: No Current Homicidal Plan: No Access to Homicidal Means: No Identified Victim: n/a History of harm to others?: No Assessment of Violence: None Noted Violent Behavior Description: none report Does patient have access to weapons?: No Criminal Charges  Pending?: No Does patient have a court date: No Is patient on probation?: No  Psychosis Hallucinations: None noted Delusions: None noted  Mental Status Report Appearance/Hygiene: Unremarkable Eye Contact: Fair Motor Activity: Freedom of movement Speech: Soft Level of Consciousness: Alert, Crying Mood: Depressed, Sad, Guilty Affect: Depressed Anxiety Level: Minimal Thought Processes: Coherent Judgement: Partial Orientation: Person, Place, Time, Situation Obsessive Compulsive Thoughts/Behaviors: None  Cognitive Functioning Concentration: Good Memory: Recent Intact, Remote Intact Is patient IDD: No Is patient DD?: No Insight: Poor Impulse Control: Fair Appetite: Poor Have you had any weight changes? : No Change Sleep: Decreased Total Hours of Sleep: (pt report inability to sleep; insomnia ) Vegetative Symptoms: None  ADLScreening [facility] Assessment Services) Patient's cognitive ability adequate to safely complete daily activities?: Yes Patient able to express need for assistance with ADLs?: Yes Independently performs ADLs?: Yes (appropriate for developmental age)  Prior Inpatient Therapy Prior Inpatient Therapy: No  Prior Outpatient Therapy Prior Outpatient Therapy: No Does patient have an ACCT team?: No Does patient have Intensive In-House Services?  : No Does patient have [facility] services? : No Does patient have P4CC services?: No  ADL Screening (condition at time of admission) Patient's cognitive ability adequate to safely complete daily activities?: Yes Is the patient deaf or have difficulty hearing?: No Does the patient have difficulty seeing, even when wearing glasses/contacts?: No Does the patient have difficulty concentrating, remembering, or making decisions?: No Patient able to express need for assistance with ADLs?: Yes Does the patient have difficulty dressing or bathing?: No  Independently performs ADLs?: Yes (appropriate for developmental age) Does the  patient have difficulty walking or climbing stairs?: No       Abuse/Neglect Assessment (Assessment to be complete while patient is alone) Abuse/Neglect Assessment Can Be Completed: Yes Physical Abuse: Denies Verbal Abuse: Denies Sexual Abuse: Denies Exploitation of patient/patient's resources: Denies Self-Neglect: Denies     Regulatory affairs officer (For Healthcare) Does Patient Have a Medical Advance Directive?: No Would patient like information on creating a medical advance directive?: No - Patient declined    Additional Information 1:1 In Past 12 Months?: No CIRT Risk: No Elopement Risk: No Does patient have medical clearance?: No     Disposition:  Disposition Initial Assessment Completed for this Encounter: Yes Disposition of Patient: Movement to [facility] or [facility] ED, Admit([name] Rankin, NP, recommend inpt tx) Type of inpatient treatment program: Adult Patient refused recommended treatment: No  On Site Evaluation by:   Reviewed with Physician:    Despina Hidden 12/05/2017 6:28 PM

## 2017-12-05 NOTE — ED Provider Notes (Addendum)
[facility] DEPT Provider Note   CSN: [id number] Arrival date & time: 12/05/17  1803     History   Chief Complaint Chief Complaint  Patient presents with  . Suicidal  . Medical Clearance    HPI   Blood pressure 139/77, pulse 75, temperature 98.4 F (36.9 C), temperature source Oral, resp. rate 20, height 5\' 3"  (1.6 m), weight (!) 152 kg (335 lb), SpO2 99 %.  [name] [name] is a 57 y.o. female complaining of suicidal ideation, no plan.  She states that she has not really tried to commit suicide in the past that it was a misunderstanding that she passed out while she had a bottle of acetaminophen in her hand but she denies that she tried to kill herself.  Patient has CHF, is on palliative care, history of PE.  She is taking care of her mother who is in renal failure after stroke and coming up from [place] to escape a hurricane.  She states that she is caring for her grandchild who is 41 years old and has multiple behavioral problems.  Her uncle recently committed suicide with whom she was very close with.  She has been trying very hard to arrange for outpatient psychiatric care that she is discouraged because she cannot get an appointment in a reasonable timeframe of the requiring money to be seen and she is on a fixed income and does not have money to pay for multiple co-pays.  She states that she feels O more overwhelmed and in despair.  She has been told all of her home health workers to not come to the house.  She states that the social worker from the CHF center came to the house but she could not truly speak with her because there is no privacy in the house.  She states that her husband does not know how she feels.  She denies any actual suicide attempts.  Denies any auditory or visual hallucinations, no alcohol or drug abuse.  She has significant problems with worsened.  She states that she has chronic chest pain and she is having that now, its normal for  her.  No worsening, no increasing peripheral edema, she has not been weighing herself regularly.  She is been taking her CHF medications but she has not seen a cardiologist in several months which is not typical for her.  She states that she is been out of her Xarelto for several weeks because she cannot afford it.  No palpitations, syncope.  She has an IVC filter in place.  Past Medical History:  Diagnosis Date  . Arthritis   . CHF exacerbation ([place]) 08/21/2017  . Depression   . Diverticulitis   . Hyperlipidemia   . Hypertension   . Obesity   . Pneumonia   . Pulmonary embolism [facility])     Patient Active Problem List   Diagnosis Date Noted  . CHF exacerbation ([place]) 08/21/2017  . (HFpEF) heart failure with preserved ejection fraction ([place]) 08/20/2017  . Lumbar radiculopathy 08/08/2017  . Morbid obesity with BMI of 50.0-59.9, adult ([place]) 08/08/2017  . Hoarseness 08/08/2017  . Chronic cough 04/10/2017  . Rheumatoid arthritis involving multiple sites ([place]) 04/10/2017  . Anxiety and depression 04/10/2017  . Adjustment disorder with depressed mood 12/11/2016  . Morbid obesity ([place]) 08/14/2016  . Stroke ([place]) 06/27/2016  . Right sided weakness 06/27/2016  . Essential hypertension 06/27/2016  . Atypical chest pain 06/27/2016  . Difficult airway 03/29/2016  . CTEPH (  chronic thromboembolic pulmonary hypertension) ([place]) 03/14/2016  . OSA on CPAP 02/20/2016  . Chronic pain syndrome 01/31/2016  . Pulmonary embolus ([place]) 11/15/2015  . Vertigo 11/15/2015  . Depression 11/15/2015    Past Surgical History:  Procedure Laterality Date  . ABDOMINAL HYSTERECTOMY    . BREAST REDUCTION SURGERY    . CHOLECYSTECTOMY    . EMBOLECTOMY N/A    pulmonary embolectomy  . REDUCTION MAMMAPLASTY Bilateral   . RIGHT HEART CATH N/A 10/28/2016   Procedure: Right Heart Cath;  Surgeon: Jolaine Artist, MD;  Location: [place] CV LAB;  Service: Cardiovascular;  Laterality: N/A;  . RIGHT HEART CATH N/A  08/26/2017   Procedure: RIGHT HEART CATH;  Surgeon: Jolaine Artist, MD;  Location: [place] CV LAB;  Service: Cardiovascular;  Laterality: N/A;  . ULTRASOUND GUIDANCE FOR VASCULAR ACCESS  08/26/2017   Procedure: Ultrasound Guidance For Vascular Access;  Surgeon: Jolaine Artist, MD;  Location: [place] CV LAB;  Service: Cardiovascular;;     OB History   None      Home Medications    Prior to Admission medications   Medication Sig Start Date End Date Taking? Authorizing Provider  acetaminophen-codeine (TYLENOL #4) 300-60 MG tablet Take 1 tablet by mouth every 8 (eight) hours as needed for moderate pain. 12/02/17  Yes Charlott Rakes, MD  albuterol (PROVENTIL HFA;VENTOLIN HFA) 108 (90 Base) MCG/ACT inhaler Inhale 1-2 puffs into the lungs every 6 (six) hours as needed for wheezing. 10/17/17  Yes Ladell Pier, MD  atorvastatin (LIPITOR) 20 MG tablet Take 1 tablet (20 mg total) by mouth daily at 6 PM. 10/17/17  Yes Ladell Pier, MD  busPIRone (BUSPAR) 5 MG tablet Take 1 tablet (5 mg total) by mouth 2 (two) times daily. 10/17/17  Yes Ladell Pier, MD  docusate sodium (COLACE) 100 MG capsule Take 1 capsule (100 mg total) by mouth every 12 (twelve) hours. 10/24/16  Yes Harris, Abigail, PA-C  DULoxetine (CYMBALTA) 30 MG capsule Take 1 capsule (30 mg total) by mouth daily. 10/23/17  Yes Ladell Pier, MD  ferrous sulfate (FERROUSUL) 325 (65 FE) MG tablet Take 1 tablet (325 mg total) by mouth daily with breakfast. 12/25/16  Yes Langeland, Dawn T, MD  furosemide (LASIX) 40 MG tablet Take 1 tablet (40 mg total) by mouth daily. Take extra 40 mg tablet once in the afternoon AS NEEDED for weight gain 3 lbs or more. 10/17/17  Yes Ladell Pier, MD  gabapentin (NEURONTIN) 300 MG capsule Take 2 capsules (600 mg total) by mouth 3 (three) times daily. 10/17/17  Yes Ladell Pier, MD  loratadine (CLARITIN) 10 MG tablet Take 1 tablet (10 mg total) by mouth daily. 10/29/17  Yes  Ladell Pier, MD  meclizine (ANTIVERT) 25 MG tablet TAKE ONE TABLET BY MOUTH THREE TIMES DAILY AS NEEDED FOR  DIZZINESS 06/27/17  Yes Arbutus Leas, NP  omeprazole (PRILOSEC) 40 MG capsule Take 1 capsule (40 mg total) by mouth 2 (two) times daily. 10/17/17  Yes Ladell Pier, MD  potassium chloride (K-DUR,KLOR-CON) 20 MEQ tablet Take 1 tablet (20 mEq total) by mouth 2 (two) times daily. 10/29/17  Yes Ladell Pier, MD  Riociguat (ADEMPAS) 2 MG TABS Take 2 mg by mouth 3 (three) times daily. Scheduled Times: Take 1 tablet at 0700, Take 1 tablet at 1500, Take 1 tablet at 2300.   Yes [provider]  rivaroxaban (XARELTO) 20 MG TABS tablet Take 1 tablet (20  mg total) by mouth daily with supper. 10/17/17  Yes Ladell Pier, MD  spironolactone (ALDACTONE) 25 MG tablet Take 0.5 tablets (12.5 mg total) by mouth daily. 10/17/17  Yes Ladell Pier, MD  benzonatate (TESSALON) 100 MG capsule Take 1 capsule (100 mg total) by mouth every 8 (eight) hours. 09/17/17   Tanna Furry, MD  cyclobenzaprine (FLEXERIL) 5 MG tablet Take 1 tablet (5 mg total) by mouth 2 (two) times daily as needed. for muscle spams 08/27/17   Ladell Pier, MD  fluticasone [facility]) 50 MCG/ACT nasal spray Place 2 sprays into both nostrils daily. Patient taking differently: Place 2 sprays into both nostrils daily as needed for allergies.  02/04/17   Maren Reamer, MD    Family History Family History  Problem Relation Age of Onset  . Lung cancer Maternal Grandmother   . Dementia Mother   . Dementia Father   . Anxiety disorder Sister   . Bipolar disorder Sister   . Drug abuse Sister   . Sexual abuse Maternal Aunt   . Anxiety disorder Cousin   . Anxiety disorder Sister   . Bipolar disorder Sister   . Drug abuse Sister   . Breast cancer Neg Hx     Social History Social History   Tobacco Use  . Smoking status: Never Smoker  . Smokeless tobacco: Never Used  Substance Use Topics  . Alcohol use: No   . Drug use: No     Allergies   [name].   Review of Systems Review of Systems  A complete review of systems was obtained and all systems are negative except as noted in the HPI and PMH.   Physical Exam Updated Vital Signs BP 139/77 (BP Location: Right Arm)   Pulse 75   Temp 98.4 F (36.9 C) (Oral)   Resp 20   Ht 5\' 3"  (1.6 m)   Wt (!) 152 kg (335 lb)   SpO2 99%   BMI 59.34 kg/m   Physical Exam  Constitutional: She is oriented to person, place, and time. She appears well-developed and well-nourished. No distress.  HENT:  Head: Normocephalic and atraumatic.  Mouth/Throat: Oropharynx is clear and moist.  Eyes: Pupils are equal, round, and reactive to light. Conjunctivae and EOM are normal.  Neck: Normal range of motion.  Cardiovascular: Normal rate, regular rhythm and intact distal pulses.  Pulmonary/Chest: Effort normal and breath sounds normal.  Abdominal: Soft. There is no tenderness.  Musculoskeletal: Normal range of motion.  Neurological: She is alert and oriented to person, place, and time.  Skin: She is not diaphoretic.  Psychiatric:  Tearful, agitated, suicidal  Nursing note and vitals reviewed.    ED Treatments / Results  Labs (all labs ordered are listed, but only abnormal results are displayed) Labs Reviewed  ACETAMINOPHEN LEVEL - Abnormal; Notable for the following components:      Result Value   Acetaminophen (Tylenol), Serum <10 (*)    All other components within normal limits  CBC - Abnormal; Notable for the following components:   RBC 6.07 (*)    MCV 66.1 (*)    MCH 20.9 (*)    RDW 18.2 (*)    All other components within normal limits  COMPREHENSIVE METABOLIC PANEL  ETHANOL  SALICYLATE LEVEL  RAPID URINE DRUG SCREEN, HOSP PERFORMED  I-STAT TROPONIN, ED    EKG None  Radiology No results found.  Procedures Procedures (including critical care time)  Medications Ordered in ED Medications -  No data to  display   Initial Impression / Assessment and Plan / ED Course  I have reviewed the triage vital signs and the nursing notes.  Pertinent labs & imaging results that were available during my care of the patient were reviewed by me and considered in my medical decision making (see chart for details).    Vitals:   12/05/17 1946 12/05/17 1951 12/05/17 2022 12/05/17 2143  BP: 139/77   124/68  Pulse: 75   (!) 105  Resp: 20   18  Temp: 98.4 F (36.9 C)   98.7 F (37.1 C)  TempSrc: Oral   Oral  SpO2: 99%   95%  Weight:  (!) 152 kg (335 lb) (!) 152 kg (335 lb)   Height:   5\' 3"  (1.6 m)     Medications  diazepam (VALIUM) tablet 2 mg (has no administration in time range)  acetaminophen-codeine (TYLENOL #4) 300-60 MG per tablet 1 tablet (has no administration in time range)  albuterol (PROVENTIL HFA;VENTOLIN HFA) 108 (90 Base) MCG/ACT inhaler 1-2 puff (has no administration in time range)  atorvastatin (LIPITOR) tablet 20 mg (has no administration in time range)  benzonatate (TESSALON) capsule 100 mg (has no administration in time range)  busPIRone (BUSPAR) tablet 5 mg (has no administration in time range)  cyclobenzaprine (FLEXERIL) tablet 5 mg (has no administration in time range)  docusate sodium (COLACE) capsule 100 mg (has no administration in time range)  DULoxetine (CYMBALTA) DR capsule 30 mg (has no administration in time range)  ferrous sulfate tablet 325 mg (has no administration in time range)  fluticasone (FLONASE) 50 MCG/ACT nasal spray 2 spray (has no administration in time range)  furosemide (LASIX) tablet 40 mg (has no administration in time range)  gabapentin (NEURONTIN) capsule 600 mg (has no administration in time range)  meclizine (ANTIVERT) tablet 25 mg (has no administration in time range)  loratadine (CLARITIN) tablet 10 mg (has no administration in time range)  pantoprazole (PROTONIX) EC tablet 40 mg (has no administration in time range)  rivaroxaban (XARELTO)  tablet 20 mg (has no administration in time range)  potassium chloride SA (K-DUR,KLOR-CON) CR tablet 20 mEq (has no administration in time range)  Riociguat TABS 2 mg (has no administration in time range)  spironolactone (ALDACTONE) tablet 12.5 mg (has no administration in time range)    [name] [name] is 57 y.o. female presenting with suicidal ideation, this patient is overwhelmed, she has an incredible amount of life stressors at this point.  She has been trying very hard to obtain outpatient psychiatric care but she cannot afford it, she cannot afford her medications.  She is on palliative care secondary to her severe CHF.  She is caring for her mother and she also has a grandchild with developmental and behavioral issues that she is trying to care for her as well.  She is reporting chest pain but she states that this is typical for her.  She is been out of her anticoagulation for a week or 2 but she has no , tachypnea or hypoxia to suggest PE.  I think that this pain is chronic for her.  I have restarted her anticoagulation.  EKG with no acute findings, chest x-ray negative, lung sounds clear to auscultation.    Case signed out to [facility], pending troponin patient will be medically cleared for psychiatric evaluation.  This patient's home meds are ordered but she will need psychiatric holding orders placed.     Final Clinical  Impressions(s) / ED Diagnoses   Final diagnoses:  None    ED Discharge Orders    None       Abrish Erny, Charna [facility] 12/05/17 2207    Delbert Darley, Charna [facility] 12/05/17 2208    Dorie Rank, MD 12/05/17 [phone]

## 2017-12-05 NOTE — H&P (Addendum)
Behavioral Health Medical Screening Exam  [name] [name] is an 57 y.o. female patient presents as walk in at [facility] with complaints of worsening depression, suicidal ideation with plan to overdose.  Patient is unable to contract for safety.    Total Time spent with patient: 45 minutes  Psychiatric Specialty Exam: Physical Exam  Constitutional: She is oriented to person, place, and time.  Neck: Normal range of motion.  Respiratory: Effort normal.  Musculoskeletal: Normal range of motion.  Neurological: She is alert and oriented to person, place, and time.  Psychiatric: Her mood appears anxious. Cognition and memory are normal. She expresses impulsivity. She exhibits a depressed mood. She expresses suicidal ideation. She expresses suicidal plans.    Review of Systems  Respiratory: Positive for shortness of breath (Wears 2L/Min oxygen with ambulation).   Psychiatric/Behavioral: Positive for depression and suicidal ideas. Negative for hallucinations and substance abuse. The patient is nervous/anxious and has insomnia.     There were no vitals taken for this visit.There is no height or weight on file to calculate BMI.  General Appearance: Casual and Neat  Eye Contact:  Good  Speech:  Clear and Coherent and Normal Rate  Volume:  Normal  Mood:  Anxious, Depressed and Hopeless  Affect:  Depressed, Flat and Tearful  Thought Process:  Coherent and Goal Directed  Orientation:  Full (Time, Place, and Person)  Thought Content:  Logical  Suicidal Thoughts:  Yes.  with intent/plan  Homicidal Thoughts:  No  Memory:  Immediate;   Good Recent;   Good Remote;   Good  Judgement:  Impaired  Insight:  Fair  Psychomotor Activity:  Normal  Concentration: Concentration: Good and Attention Span: Good  Recall:  Good  Fund of Knowledge:Good  Language: Good  Akathisia:  No  Handed:  Right  AIMS (if indicated):     Assets:  Communication Skills Desire for  Improvement Housing Resilience Social Support  Sleep:       Musculoskeletal: Strength & Muscle Tone: within normal limits Gait & Station: Uses can with ambulation Patient leans: N/A  There were no vitals taken for this visit.  Recommendations:   Inpatient psychiatric treatment after medical clearance.   Based on my evaluation the patient appears to have an emergency medical condition for which I recommend the patient be transferred to the emergency department for further evaluation.  Shuvon Rankin, NP 12/05/2017, 5:35 PM

## 2017-12-05 NOTE — ED Notes (Signed)
Pt walks with a cane.  

## 2017-12-05 NOTE — ED Triage Notes (Signed)
Per Pt: Pt reports she is depressed and has suicidal thoughts. Pt reports she is not actively wanting to kill herself right now but if she was going to kill herself she plans to "take a lot of pills".  Pt reports she recently lost multiple people who were close to her.

## 2017-12-05 NOTE — ED Notes (Signed)
Bed: BD53 Expected date:  Expected time:  Means of arrival:  Comments: Triage 5

## 2017-12-06 ENCOUNTER — Telehealth: Payer: Self-pay | Admitting: Surgery

## 2017-12-06 DIAGNOSIS — F419 Anxiety disorder, unspecified: Secondary | ICD-10-CM

## 2017-12-06 DIAGNOSIS — R450 Nervousness: Secondary | ICD-10-CM | POA: Diagnosis not present

## 2017-12-06 DIAGNOSIS — Z810 Family history of intellectual disabilities: Secondary | ICD-10-CM

## 2017-12-06 DIAGNOSIS — F332 Major depressive disorder, recurrent severe without psychotic features: Secondary | ICD-10-CM | POA: Diagnosis present

## 2017-12-06 DIAGNOSIS — R45851 Suicidal ideations: Secondary | ICD-10-CM

## 2017-12-06 DIAGNOSIS — Z818 Family history of other mental and behavioral disorders: Secondary | ICD-10-CM | POA: Diagnosis not present

## 2017-12-06 DIAGNOSIS — Z813 Family history of other psychoactive substance abuse and dependence: Secondary | ICD-10-CM | POA: Diagnosis not present

## 2017-12-06 LAB — I-STAT TROPONIN, ED: Troponin i, poc: 0 ng/mL (ref 0.00–0.08)

## 2017-12-06 MED ORDER — RIOCIGUAT 1 MG PO TABS
1.0000 mg | ORAL_TABLET | Freq: Three times a day (TID) | ORAL | Status: DC
  Administered 2017-12-06 – 2017-12-07 (×3): 1 mg via ORAL

## 2017-12-06 MED ORDER — BUPROPION HCL ER (XL) 150 MG PO TB24: 150.0000 mg | ORAL_TABLET | Freq: Every day | ORAL | Status: DC

## 2017-12-06 MED ORDER — DULOXETINE HCL 30 MG PO CPEP
60.0000 mg | ORAL_CAPSULE | Freq: Every day | ORAL | Status: DC
  Administered 2017-12-07: 60 mg via ORAL
  Filled 2017-12-06: qty 2

## 2017-12-06 MED ORDER — RIOCIGUAT 2 MG PO TABS: 1.0000 mg | ORAL_TABLET | Freq: Three times a day (TID) | ORAL | Status: DC

## 2017-12-06 NOTE — Progress Notes (Signed)
CSW referred pt out at the direction of the tx team, to [place], [place], [place], and [place]. Runell Gess.  Please reconsult if future social work needs arise.  CSW signing off, as social work intervention is no longer needed.  Alphonse Guild. Darwin Rothlisberger, LCSW, LCAS, CSI Clinical Social Worker Ph: [phone]

## 2017-12-06 NOTE — Consult Note (Signed)
[place] Psychiatry Consult   Reason for Consult: Depression Referring Physician:  EDP Patient Identification: [name] [name] MRN:  [id number] Principal Diagnosis: Major depressive disorder, recurrent episode, severe ([place]) Diagnosis:   Patient Active Problem List   Diagnosis Date Noted  . Major depressive disorder, recurrent episode, severe ([place]) [F33.2] 12/06/2017  . CHF exacerbation ([place]) [I50.9] 08/21/2017  . (HFpEF) heart failure with preserved ejection fraction ([place]) [I50.30] 08/20/2017  . Lumbar radiculopathy [M54.16] 08/08/2017  . Morbid obesity with BMI of 50.0-59.9, adult ([place]) [E66.01, Z68.43] 08/08/2017  . Hoarseness [R49.0] 08/08/2017  . Chronic cough [R05] 04/10/2017  . Rheumatoid arthritis involving multiple sites ([place]) [M06.9] 04/10/2017  . Anxiety and depression [F41.9, F32.9] 04/10/2017  . Adjustment disorder with depressed mood [F43.21] 12/11/2016  . Morbid obesity ([place]) [E66.01] 08/14/2016  . Stroke ([place]) [I63.9] 06/27/2016  . Right sided weakness [R53.1] 06/27/2016  . Essential hypertension [I10] 06/27/2016  . Atypical chest pain [R07.89] 06/27/2016  . Difficult airway [T88.4XXA] 03/29/2016  . CTEPH (chronic thromboembolic pulmonary hypertension) ([place]) [I27.24] 03/14/2016  . OSA on CPAP [G47.33, Z99.89] 02/20/2016  . Chronic pain syndrome [G89.4] 01/31/2016  . Pulmonary embolus ([place]) [I26.99] 11/15/2015  . Vertigo [R42] 11/15/2015  . Depression [F32.9] 11/15/2015    Total Time spent with patient: 45 minutes  Subjective:   [name] [name] is a 57 y.o. female patient admitted with suicidal thoughts.  HPI:  Patient who reports history of Major depression and multiple medical problem. Patient reports that she use to take medication for depression but since her health insurance changed to [facility] she has not been able to get a psychiatrist who is credential by [employer] As a result, she has not been taking antidepressant. She reports worsening  depression, low energy level, lack of motivation, hopelessness and recurrent suicidal thoughts with plan to overdose. Patient also reports being overwhelmed and stressed out by family issues, recently had multiple deaths in her family and multiple chronic medical issues.  Past Psychiatric History: as above  Risk to Self: Is patient at risk for suicide?: Yes Risk to Others:   Prior Inpatient Therapy:   Prior Outpatient Therapy:    Past Medical History:  Past Medical History:  Diagnosis Date  . Arthritis   . CHF exacerbation ([place]) 08/21/2017  . Depression   . Diverticulitis   . Hyperlipidemia   . Hypertension   . Obesity   . Pneumonia   . Pulmonary embolism [facility])     Past Surgical History:  Procedure Laterality Date  . ABDOMINAL HYSTERECTOMY    . BREAST REDUCTION SURGERY    . CHOLECYSTECTOMY    . EMBOLECTOMY N/A    pulmonary embolectomy  . REDUCTION MAMMAPLASTY Bilateral   . RIGHT HEART CATH N/A 10/28/2016   Procedure: Right Heart Cath;  Surgeon: Jolaine Artist, MD;  Location: [place] CV LAB;  Service: Cardiovascular;  Laterality: N/A;  . RIGHT HEART CATH N/A 08/26/2017   Procedure: RIGHT HEART CATH;  Surgeon: Jolaine Artist, MD;  Location: [place] CV LAB;  Service: Cardiovascular;  Laterality: N/A;  . ULTRASOUND GUIDANCE FOR VASCULAR ACCESS  08/26/2017   Procedure: Ultrasound Guidance For Vascular Access;  Surgeon: Jolaine Artist, MD;  Location: [place] CV LAB;  Service: Cardiovascular;;   Family History:  Family History  Problem Relation Age of Onset  . Lung cancer Maternal Grandmother   . Dementia Mother   . Dementia Father   . Anxiety disorder Sister   . Bipolar disorder Sister   .  Drug abuse Sister   . Sexual abuse Maternal Aunt   . Anxiety disorder Cousin   . Anxiety disorder Sister   . Bipolar disorder Sister   . Drug abuse Sister   . Breast cancer Neg Hx    Family Psychiatric  History:  Social History:  Social History   Substance  and Sexual Activity  Alcohol Use No     Social History   Substance and Sexual Activity  Drug Use No    Social History   Socioeconomic History  . Marital status: Married    Spouse name: Not on file  . Number of children: Not on file  . Years of education: Not on file  . Highest education level: Not on file  Occupational History  . Not on file  Social Needs  . Financial resource strain: Not on file  . Food insecurity:    Worry: Not on file    Inability: Not on file  . Transportation needs:    Medical: Not on file    Non-medical: Not on file  Tobacco Use  . Smoking status: Never Smoker  . Smokeless tobacco: Never Used  Substance and Sexual Activity  . Alcohol use: No  . Drug use: No  . Sexual activity: Yes    Partners: Male    Birth control/protection: Surgical  Lifestyle  . Physical activity:    Days per week: Not on file    Minutes per session: Not on file  . Stress: Not on file  Relationships  . Social connections:    Talks on phone: Not on file    Gets together: Not on file    Attends religious service: Not on file    Active member of club or organization: Not on file    Attends meetings of clubs or organizations: Not on file    Relationship status: Not on file  Other Topics Concern  . Not on file  Social History Narrative  . Not on file   Additional Social History:    Allergies:  No Known Allergies  Labs:  Results for orders placed or performed during the hospital encounter of 12/05/17 (from the past 48 hour(s))  Rapid urine drug screen (hospital performed)     Status: None   Collection Time: 12/05/17  8:06 PM  Result Value Ref Range   Opiates NONE DETECTED NONE DETECTED   Cocaine NONE DETECTED NONE DETECTED   Benzodiazepines NONE DETECTED NONE DETECTED   Amphetamines NONE DETECTED NONE DETECTED   Tetrahydrocannabinol NONE DETECTED NONE DETECTED   Barbiturates NONE DETECTED NONE DETECTED    Comment: (NOTE) DRUG SCREEN FOR MEDICAL PURPOSES ONLY.   IF CONFIRMATION IS NEEDED FOR ANY PURPOSE, NOTIFY LAB WITHIN 5 DAYS. LOWEST DETECTABLE LIMITS FOR URINE DRUG SCREEN Drug Class                     Cutoff (ng/mL) Amphetamine and metabolites    1000 Barbiturate and metabolites    200 Benzodiazepine                 468 Tricyclics and metabolites     300 Opiates and metabolites        300 Cocaine and metabolites        300 THC                            50 Performed at [facility], [place] [address] [place], [place]  [address]   Comprehensive metabolic panel     Status: None   Collection Time: 12/05/17  8:24 PM  Result Value Ref Range   Sodium 144 135 - 145 mmol/L   Potassium 3.9 3.5 - 5.1 mmol/L   Chloride 106 101 - 111 mmol/L   CO2 28 22 - 32 mmol/L   Glucose, Bld 97 65 - 99 mg/dL   BUN 10 6 - 20 mg/dL   Creatinine, Ser 0.83 0.44 - 1.00 mg/dL   Calcium 9.9 8.9 - 10.3 mg/dL   Total Protein 7.8 6.5 - 8.1 g/dL   Albumin 4.3 3.5 - 5.0 g/dL   AST 16 15 - 41 U/L   ALT 15 14 - 54 U/L   Alkaline Phosphatase 83 38 - 126 U/L   Total Bilirubin 0.5 0.3 - 1.2 mg/dL   GFR calc non Af Amer >60 >60 mL/min   GFR calc Af Amer >60 >60 mL/min    Comment: (NOTE) The eGFR has been calculated using the CKD EPI equation. This calculation has not been validated in all clinical situations. eGFR's persistently <60 mL/min signify possible Chronic Kidney Disease.    Anion gap 10 5 - 15    Comment: Performed at [facility], [place] [address] [place], [place] [address]  Ethanol     Status: None   Collection Time: 12/05/17  8:24 PM  Result Value Ref Range   Alcohol, Ethyl (B) <10 <10 mg/dL    Comment:        LOWEST DETECTABLE LIMIT FOR SERUM ALCOHOL IS 10 mg/dL FOR MEDICAL PURPOSES ONLY Performed at [facility], [place] [address], [place], [place] [address]   Salicylate level     Status: None   Collection Time: 12/05/17  8:24 PM  Result Value Ref Range   Salicylate Lvl <9.3 2.8 - 30.0 mg/dL     Comment: Performed at [facility], [place] [address], [place], [place] [address]  Acetaminophen level     Status: Abnormal   Collection Time: 12/05/17  8:24 PM  Result Value Ref Range   Acetaminophen (Tylenol), Serum <10 (L) 10 - 30 ug/mL    Comment:        THERAPEUTIC CONCENTRATIONS VARY SIGNIFICANTLY. A RANGE OF 10-30 ug/mL MAY BE AN EFFECTIVE CONCENTRATION FOR MANY PATIENTS. HOWEVER, SOME ARE BEST TREATED AT CONCENTRATIONS OUTSIDE THIS RANGE. ACETAMINOPHEN CONCENTRATIONS >150 ug/mL AT 4 HOURS AFTER INGESTION AND >50 ug/mL AT 12 HOURS AFTER INGESTION ARE OFTEN ASSOCIATED WITH TOXIC REACTIONS. Performed at [facility], [place] [address], [place], [place] [address]   cbc     Status: Abnormal   Collection Time: 12/05/17  8:24 PM  Result Value Ref Range   WBC 7.4 4.0 - 10.5 K/uL   RBC 6.07 (H) 3.87 - 5.11 MIL/uL   Hemoglobin 12.7 12.0 - 15.0 g/dL   HCT 40.1 36.0 - 46.0 %   MCV 66.1 (L) 78.0 - 100.0 fL   MCH 20.9 (L) 26.0 - 34.0 pg   MCHC 31.7 30.0 - 36.0 g/dL   RDW 18.2 (H) 11.5 - 15.5 %   Platelets 329 150 - 400 K/uL    Comment: SPECIMEN CHECKED FOR CLOTS REPEATED TO VERIFY PLATELET COUNT CONFIRMED BY SMEAR Performed at [place] [address], [place], [place] [address]   I-stat troponin, ED     Status: None   Collection Time: 12/06/17  5:41 AM  Result Value Ref Range   Troponin i, poc  0.00 0.00 - 0.08 ng/mL   Comment 3            Comment: Due to the release kinetics of cTnI, a negative result within the first hours of the onset of symptoms does not rule out myocardial infarction with certainty. If myocardial infarction is still suspected, repeat the test at appropriate intervals.     Current Facility-Administered Medications  Medication Dose Route Frequency Provider Last Rate Last Dose  . acetaminophen-codeine (TYLENOL #4) 300-60 MG per tablet 1 tablet  1 tablet Oral Q8H PRN Pisciotta, Elmyra Ricks, PA-C    1 tablet at 12/06/17 0818  . albuterol (PROVENTIL HFA;VENTOLIN HFA) 108 (90 Base) MCG/ACT inhaler 1-2 puff  1-2 puff Inhalation Q6H PRN Pisciotta, Nicole, PA-C      . atorvastatin (LIPITOR) tablet 20 mg  20 mg Oral q1800 Pisciotta, Nicole, PA-C      . benzonatate (TESSALON) capsule 100 mg  100 mg Oral Q8H Pisciotta, Nicole, PA-C   100 mg at 12/06/17 8295  . cyclobenzaprine (FLEXERIL) tablet 5 mg  5 mg Oral TID PRN Pisciotta, Elmyra Ricks, PA-C      . docusate sodium (COLACE) capsule 100 mg  100 mg Oral Q12H Pisciotta, Nicole, PA-C   100 mg at 12/06/17 1110  . [START ON 12/07/2017] DULoxetine (CYMBALTA) DR capsule 60 mg  60 mg Oral Daily Laneya Gasaway, MD      . ferrous sulfate tablet 325 mg  325 mg Oral Q breakfast Pisciotta, Elmyra Ricks, PA-C   325 mg at 12/06/17 1109  . fluticasone (FLONASE) 50 MCG/ACT nasal spray 2 spray  2 spray Each Nare Daily PRN Pisciotta, Nicole, PA-C      . furosemide (LASIX) tablet 40 mg  40 mg Oral Daily Pisciotta, Elmyra Ricks, PA-C   40 mg at 12/06/17 1110  . gabapentin (NEURONTIN) capsule 600 mg  600 mg Oral TID Pisciotta, Nicole, PA-C   600 mg at 12/06/17 1109  . loratadine (CLARITIN) tablet 10 mg  10 mg Oral Daily Pisciotta, Nicole, PA-C   10 mg at 12/06/17 1110  . meclizine (ANTIVERT) tablet 25 mg  25 mg Oral BID PRN Pisciotta, Nicole, PA-C      . pantoprazole (PROTONIX) EC tablet 40 mg  40 mg Oral Daily Pisciotta, Nicole, PA-C   40 mg at 12/06/17 1109  . potassium chloride SA (K-DUR,KLOR-CON) CR tablet 20 mEq  20 mEq Oral BID Pisciotta, Nicole, PA-C   20 mEq at 12/06/17 1107  . Riociguat TABS 1 mg  1 mg Oral TID Pisciotta, Elmyra Ricks, PA-C      . rivaroxaban (XARELTO) tablet 20 mg  20 mg Oral Q supper Pisciotta, Nicole, PA-C      . spironolactone (ALDACTONE) tablet 12.5 mg  12.5 mg Oral Daily Pisciotta, Elmyra Ricks, PA-C   12.5 mg at 12/06/17 1110   Current Outpatient Medications  Medication Sig Dispense Refill  . acetaminophen-codeine (TYLENOL #4) 300-60 MG tablet Take 1 tablet by  mouth every 8 (eight) hours as needed for moderate pain. 70 tablet 0  . albuterol (PROVENTIL HFA;VENTOLIN HFA) 108 (90 Base) MCG/ACT inhaler Inhale 1-2 puffs into the lungs every 6 (six) hours as needed for wheezing. 3 Inhaler 6  . atorvastatin (LIPITOR) 20 MG tablet Take 1 tablet (20 mg total) by mouth daily at 6 PM. 90 tablet 3  . benzonatate (TESSALON) 100 MG capsule Take 1 capsule (100 mg total) by mouth every 8 (eight) hours. 21 capsule 0  . busPIRone (BUSPAR) 5 MG tablet Take 1 tablet (5 mg total)  by mouth 2 (two) times daily. 180 tablet 3  . cyclobenzaprine (FLEXERIL) 5 MG tablet Take 1 tablet (5 mg total) by mouth 2 (two) times daily as needed. for muscle spams 40 tablet 1  . docusate sodium (COLACE) 100 MG capsule Take 1 capsule (100 mg total) by mouth every 12 (twelve) hours. 30 capsule 0  . DULoxetine (CYMBALTA) 30 MG capsule Take 1 capsule (30 mg total) by mouth daily. 30 capsule 3  . ferrous sulfate (FERROUSUL) 325 (65 FE) MG tablet Take 1 tablet (325 mg total) by mouth daily with breakfast. 90 tablet 3  . fluticasone (FLONASE) 50 MCG/ACT nasal spray Place 2 sprays into both nostrils daily. (Patient taking differently: Place 2 sprays into both nostrils daily as needed for allergies. ) 16 g 6  . furosemide (LASIX) 40 MG tablet Take 1 tablet (40 mg total) by mouth daily. Take extra 40 mg tablet once in the afternoon AS NEEDED for weight gain 3 lbs or more. 180 tablet 3  . gabapentin (NEURONTIN) 300 MG capsule Take 2 capsules (600 mg total) by mouth 3 (three) times daily. 540 capsule 3  . loratadine (CLARITIN) 10 MG tablet Take 1 tablet (10 mg total) by mouth daily. 90 tablet 2  . meclizine (ANTIVERT) 25 MG tablet TAKE ONE TABLET BY MOUTH THREE TIMES DAILY AS NEEDED FOR  DIZZINESS 60 tablet 0  . omeprazole (PRILOSEC) 40 MG capsule Take 1 capsule (40 mg total) by mouth 2 (two) times daily. 180 capsule 3  . potassium chloride (K-DUR,KLOR-CON) 20 MEQ tablet Take 1 tablet (20 mEq total) by mouth  2 (two) times daily. 180 tablet 3  . Riociguat (ADEMPAS) 1 MG TABS Take 1 mg by mouth 3 (three) times daily. At 0700, 1500 and 2300    . rivaroxaban (XARELTO) 20 MG TABS tablet Take 1 tablet (20 mg total) by mouth daily with supper. 90 tablet 3  . spironolactone (ALDACTONE) 25 MG tablet Take 0.5 tablets (12.5 mg total) by mouth daily. 45 tablet 3    Musculoskeletal: Strength & Muscle Tone: within normal limits Gait & Station: unable to stand Patient leans: N/A  Psychiatric Specialty Exam: Physical Exam  Psychiatric: Her speech is normal. Judgment normal. Her affect is blunt. She is slowed and withdrawn. Cognition and memory are normal. She exhibits a depressed mood. She expresses suicidal ideation. She expresses suicidal plans.    Review of Systems  Constitutional: Positive for malaise/fatigue.  HENT: Negative.   Eyes: Negative.   Respiratory: Positive for shortness of breath.   Cardiovascular: Positive for palpitations, orthopnea and leg swelling.  Gastrointestinal: Negative.   Genitourinary: Negative.   Musculoskeletal: Positive for myalgias.  Skin: Negative.   Neurological: Negative.   Endo/Heme/Allergies: Negative.   Psychiatric/Behavioral: Positive for depression and suicidal ideas. The patient is nervous/anxious.     Blood pressure (!) 114/54, pulse 76, temperature 97.6 F (36.4 C), temperature source Oral, resp. rate 18, height _0  (1.6 m), weight (!) 152 kg (335 lb), SpO2 95 %.Body mass index is 59.34 kg/m.  General Appearance: Casual  Eye Contact:  Good  Speech:  Clear and Coherent  Volume:  Decreased  Mood:  Depressed and Dysphoric  Affect:  Constricted and Tearful  Thought Process:  Coherent and Descriptions of Associations: Intact  Orientation:  Full (Time, Place, and Person)  Thought Content:  Logical  Suicidal Thoughts:  Yes.  with intent/plan  Homicidal Thoughts:  No  Memory:  Immediate;   Good Recent;   Good Remote;  Good  Judgement:  Poor  Insight:   Shallow  Psychomotor Activity:  Psychomotor Retardation  Concentration:  Concentration: Fair and Attention Span: Fair  Recall:  Good  Fund of Knowledge:  Good  Language:  Good  Akathisia:  No  Handed:  Right  AIMS (if indicated):     Assets:  Communication Skills Desire for Improvement  ADL's:  marginal  Cognition:  WNL  Sleep:   fair     Treatment Plan Summary: Daily contact with patient to assess and evaluate symptoms and progress in treatment and Medication management  Increase Cymbalta to 60 mg daily for depression.  Disposition: Recommend psychiatric Inpatient admission when medically cleared. Supportive therapy provided about ongoing stressors.  Corena Pilgrim, MD 12/06/2017 12:06 PM

## 2017-12-06 NOTE — Telephone Encounter (Signed)
ED CM received CM consult concerning patient needing assistance with a PCP and medications. CM noted that patient is active with [facility] and has [facility] Medicare coverage. CM  attempted to contact patient and LVM (HIPPA compliant) for a return call. CM will follow up with patient.

## 2017-12-06 NOTE — BHH Counselor (Signed)
Pt was assessed as a walk-in at [facility], pt meets inpatient treatment. Clinician dicussed disposition with Fredderick Phenix., PA and Minette Headland, RN. TTS to seek placement.    Vertell Novak, MS, [facility], [facility] Triage Specialist [phone]

## 2017-12-06 NOTE — ED Notes (Signed)
TTS PRESENT 

## 2017-12-06 NOTE — ED Provider Notes (Signed)
6:01 AM Patient care assumed from [facility], PA-C at change of shift.  Pending troponin.  Plan discussed which includes medical clearance if negative.  This test has been reviewed and is reassuring.  We will proceed with psychiatric evaluation.   Antonietta Breach, PA-C 12/06/17 0602    Ward, Delice Bison, DO 12/06/17 [phone]

## 2017-12-06 NOTE — ED Notes (Signed)
Patient pleasant and cooperative with staff and is noted to brighten on approach. Sitter in room at bedside for safety. Pt denies suicidal or homicidal ideation at this time and verbally contracts for safety. No noted physical distress.

## 2017-12-07 DIAGNOSIS — F332 Major depressive disorder, recurrent severe without psychotic features: Secondary | ICD-10-CM | POA: Diagnosis not present

## 2017-12-07 DIAGNOSIS — I10 Essential (primary) hypertension: Secondary | ICD-10-CM | POA: Diagnosis not present

## 2017-12-07 DIAGNOSIS — Z813 Family history of other psychoactive substance abuse and dependence: Secondary | ICD-10-CM | POA: Diagnosis not present

## 2017-12-07 DIAGNOSIS — I110 Hypertensive heart disease with heart failure: Secondary | ICD-10-CM | POA: Diagnosis not present

## 2017-12-07 DIAGNOSIS — Z79899 Other long term (current) drug therapy: Secondary | ICD-10-CM | POA: Diagnosis not present

## 2017-12-07 DIAGNOSIS — R45851 Suicidal ideations: Secondary | ICD-10-CM | POA: Diagnosis not present

## 2017-12-07 DIAGNOSIS — E785 Hyperlipidemia, unspecified: Secondary | ICD-10-CM | POA: Diagnosis not present

## 2017-12-07 DIAGNOSIS — Z6841 Body Mass Index (BMI) 40.0 and over, adult: Secondary | ICD-10-CM | POA: Diagnosis not present

## 2017-12-07 DIAGNOSIS — K219 Gastro-esophageal reflux disease without esophagitis: Secondary | ICD-10-CM | POA: Diagnosis not present

## 2017-12-07 DIAGNOSIS — I509 Heart failure, unspecified: Secondary | ICD-10-CM | POA: Diagnosis not present

## 2017-12-07 DIAGNOSIS — F419 Anxiety disorder, unspecified: Secondary | ICD-10-CM | POA: Diagnosis not present

## 2017-12-07 DIAGNOSIS — Z810 Family history of intellectual disabilities: Secondary | ICD-10-CM | POA: Diagnosis not present

## 2017-12-07 DIAGNOSIS — Z818 Family history of other mental and behavioral disorders: Secondary | ICD-10-CM | POA: Diagnosis not present

## 2017-12-07 DIAGNOSIS — R450 Nervousness: Secondary | ICD-10-CM | POA: Diagnosis not present

## 2017-12-07 DIAGNOSIS — G4733 Obstructive sleep apnea (adult) (pediatric): Secondary | ICD-10-CM | POA: Diagnosis not present

## 2017-12-07 NOTE — ED Notes (Signed)
Myriam Jacobson with [facility] called to receive report on pt. Number obtained to call her back after transport arrangements are made. Upon talking with pt to update her, pt refuses to go to facility due to distance. At present time no Dr is available to come speak with pt regarding discharge vs IVC in order to promote quality care for pt. Pt has been updated on delay as well as Myriam Jacobson at [facility] [phone]

## 2017-12-07 NOTE — ED Notes (Signed)
Updated Myriam Jacobson at [facility] regarding plan to IVC pt and have sheriffs dept transport

## 2017-12-07 NOTE — ED Notes (Addendum)
Pt continues to state she is not going to [facility]. She is aware she has been IVC'd and will be going, Probation officer has spoken with Estate agent with [place]

## 2017-12-07 NOTE — ED Notes (Signed)
Updated [name] [name] at [facility] upon pt leaving.

## 2017-12-07 NOTE — ED Notes (Signed)
Pt has personal belongings including bag from across street with [facility] transport driver. Pt in w/c and escorted to [place]. Pt calm and cooperative upon leaving.

## 2017-12-07 NOTE — Consult Note (Signed)
[facility] Psych ED Progress Note  12/07/2017 10:41 AM [name] [name]  MRN:  [id number] Subjective: ''I need help, I am still depressed and suicidal.'' Objective: Patient seen in her room, interviewed, chart reviewed and her case discussed with treatment team. Patient still reporting being very depressed, hopeless, worthless, having low energy level, lack of motivation, recurrent suicidal ideations with plan to overdose. Patient is very tearful, withdrawn and worry about her health and family.  Principal Problem: Major depressive disorder, recurrent severe without psychotic features ([place]) Diagnosis:   Patient Active Problem List   Diagnosis Date Noted  . Major depressive disorder, recurrent severe without psychotic features ([place]) [F33.2] 12/06/2017  . CHF exacerbation ([place]) [I50.9] 08/21/2017  . (HFpEF) heart failure with preserved ejection fraction ([place]) [I50.30] 08/20/2017  . Lumbar radiculopathy [M54.16] 08/08/2017  . Morbid obesity with BMI of 50.0-59.9, adult ([place]) [E66.01, Z68.43] 08/08/2017  . Hoarseness [R49.0] 08/08/2017  . Chronic cough [R05] 04/10/2017  . Rheumatoid arthritis involving multiple sites ([place]) [M06.9] 04/10/2017  . Anxiety and depression [F41.9, F32.9] 04/10/2017  . Adjustment disorder with depressed mood [F43.21] 12/11/2016  . Morbid obesity ([place]) [E66.01] 08/14/2016  . Stroke ([place]) [I63.9] 06/27/2016  . Right sided weakness [R53.1] 06/27/2016  . Essential hypertension [I10] 06/27/2016  . Atypical chest pain [R07.89] 06/27/2016  . Difficult airway [T88.4XXA] 03/29/2016  . CTEPH (chronic thromboembolic pulmonary hypertension) ([place]) [I27.24] 03/14/2016  . OSA on CPAP [G47.33, Z99.89] 02/20/2016  . Chronic pain syndrome [G89.4] 01/31/2016  . Pulmonary embolus ([place]) [I26.99] 11/15/2015  . Vertigo [R42] 11/15/2015  . Depression [F32.9] 11/15/2015   Total Time spent with patient: 30 minutes  Past Psychiatric History: as above  Past Medical History:  Past Medical  History:  Diagnosis Date  . Arthritis   . CHF exacerbation ([place]) 08/21/2017  . Depression   . Diverticulitis   . Hyperlipidemia   . Hypertension   . Obesity   . Pneumonia   . Pulmonary embolism [facility])     Past Surgical History:  Procedure Laterality Date  . ABDOMINAL HYSTERECTOMY    . BREAST REDUCTION SURGERY    . CHOLECYSTECTOMY    . EMBOLECTOMY N/A    pulmonary embolectomy  . REDUCTION MAMMAPLASTY Bilateral   . RIGHT HEART CATH N/A 10/28/2016   Procedure: Right Heart Cath;  Surgeon: Jolaine Artist, MD;  Location: [place] CV LAB;  Service: Cardiovascular;  Laterality: N/A;  . RIGHT HEART CATH N/A 08/26/2017   Procedure: RIGHT HEART CATH;  Surgeon: Jolaine Artist, MD;  Location: [place] CV LAB;  Service: Cardiovascular;  Laterality: N/A;  . ULTRASOUND GUIDANCE FOR VASCULAR ACCESS  08/26/2017   Procedure: Ultrasound Guidance For Vascular Access;  Surgeon: Jolaine Artist, MD;  Location: [place] [facility] LAB;  Service: Cardiovascular;;   Family History:  Family History  Problem Relation Age of Onset  . Lung cancer Maternal Grandmother   . Dementia Mother   . Dementia Father   . Anxiety disorder Sister   . Bipolar disorder Sister   . Drug abuse Sister   . Sexual abuse Maternal Aunt   . Anxiety disorder Cousin   . Anxiety disorder Sister   . Bipolar disorder Sister   . Drug abuse Sister   . Breast cancer Neg Hx    Family Psychiatric  History:  Social History:  Social History   Substance and Sexual Activity  Alcohol Use No     Social History   Substance and Sexual Activity  Drug Use No  Social History   Socioeconomic History  . Marital status: Married    Spouse name: Not on file  . Number of children: Not on file  . Years of education: Not on file  . Highest education level: Not on file  Occupational History  . Not on file  Social Needs  . Financial resource strain: Not on file  . Food insecurity:    Worry: Not on file    Inability:  Not on file  . Transportation needs:    Medical: Not on file    Non-medical: Not on file  Tobacco Use  . Smoking status: Never Smoker  . Smokeless tobacco: Never Used  Substance and Sexual Activity  . Alcohol use: No  . Drug use: No  . Sexual activity: Yes    Partners: Male    Birth control/protection: Surgical  Lifestyle  . Physical activity:    Days per week: Not on file    Minutes per session: Not on file  . Stress: Not on file  Relationships  . Social connections:    Talks on phone: Not on file    Gets together: Not on file    Attends religious service: Not on file    Active member of club or organization: Not on file    Attends meetings of clubs or organizations: Not on file    Relationship status: Not on file  Other Topics Concern  . Not on file  Social History Narrative  . Not on file    Sleep: Fair  Appetite:  Fair  Current Medications: Current Facility-Administered Medications  Medication Dose Route Frequency Provider Last Rate Last Dose  . acetaminophen-codeine (TYLENOL #4) 300-60 MG per tablet 1 tablet  1 tablet Oral Q8H PRN Pisciotta, Elmyra Ricks, PA-C   1 tablet at 12/06/17 1823  . albuterol (PROVENTIL HFA;VENTOLIN HFA) 108 (90 Base) MCG/ACT inhaler 1-2 puff  1-2 puff Inhalation Q6H PRN Pisciotta, Nicole, PA-C      . atorvastatin (LIPITOR) tablet 20 mg  20 mg Oral q1800 Pisciotta, Nicole, PA-C   20 mg at 12/06/17 2133  . benzonatate (TESSALON) capsule 100 mg  100 mg Oral Q8H Pisciotta, Nicole, PA-C   100 mg at 12/07/17 0520  . cyclobenzaprine (FLEXERIL) tablet 5 mg  5 mg Oral TID PRN Pisciotta, Nicole, PA-C   5 mg at 12/06/17 2134  . docusate sodium (COLACE) capsule 100 mg  100 mg Oral Q12H Pisciotta, Nicole, PA-C   100 mg at 12/07/17 2683  . DULoxetine (CYMBALTA) DR capsule 60 mg  60 mg Oral Daily Emmajo Bennette, MD   60 mg at 12/07/17 0936  . ferrous sulfate tablet 325 mg  325 mg Oral Q breakfast Pisciotta, Elmyra Ricks, PA-C   325 mg at 12/07/17 4196  .  fluticasone (FLONASE) 50 MCG/ACT nasal spray 2 spray  2 spray Each Nare Daily PRN Pisciotta, Nicole, PA-C      . furosemide (LASIX) tablet 40 mg  40 mg Oral Daily Pisciotta, Nicole, PA-C   40 mg at 12/07/17 2229  . gabapentin (NEURONTIN) capsule 600 mg  600 mg Oral TID Pisciotta, Nicole, PA-C   600 mg at 12/07/17 0936  . loratadine (CLARITIN) tablet 10 mg  10 mg Oral Daily Pisciotta, Nicole, PA-C   10 mg at 12/07/17 0936  . meclizine (ANTIVERT) tablet 25 mg  25 mg Oral BID PRN Pisciotta, Nicole, PA-C      . pantoprazole (PROTONIX) EC tablet 40 mg  40 mg Oral Daily Pisciotta, Elmyra Ricks, PA-C  40 mg at 12/07/17 0937  . potassium chloride SA (K-DUR,KLOR-CON) CR tablet 20 mEq  20 mEq Oral BID Pisciotta, Nicole, PA-C   20 mEq at 12/07/17 9407  . Riociguat TABS 1 mg  1 mg Oral TID Pisciotta, Nicole, PA-C   1 mg at 12/06/17 2132  . rivaroxaban (XARELTO) tablet 20 mg  20 mg Oral Q supper Pisciotta, Elmyra Ricks, PA-C   20 mg at 12/06/17 1820  . spironolactone (ALDACTONE) tablet 12.5 mg  12.5 mg Oral Daily Pisciotta, Elmyra Ricks, PA-C   12.5 mg at 12/07/17 6808   Current Outpatient Medications  Medication Sig Dispense Refill  . acetaminophen-codeine (TYLENOL #4) 300-60 MG tablet Take 1 tablet by mouth every 8 (eight) hours as needed for moderate pain. 70 tablet 0  . albuterol (PROVENTIL HFA;VENTOLIN HFA) 108 (90 Base) MCG/ACT inhaler Inhale 1-2 puffs into the lungs every 6 (six) hours as needed for wheezing. 3 Inhaler 6  . atorvastatin (LIPITOR) 20 MG tablet Take 1 tablet (20 mg total) by mouth daily at 6 PM. 90 tablet 3  . benzonatate (TESSALON) 100 MG capsule Take 1 capsule (100 mg total) by mouth every 8 (eight) hours. 21 capsule 0  . busPIRone (BUSPAR) 5 MG tablet Take 1 tablet (5 mg total) by mouth 2 (two) times daily. 180 tablet 3  . cyclobenzaprine (FLEXERIL) 5 MG tablet Take 1 tablet (5 mg total) by mouth 2 (two) times daily as needed. for muscle spams 40 tablet 1  . docusate sodium (COLACE) 100 MG capsule Take  1 capsule (100 mg total) by mouth every 12 (twelve) hours. 30 capsule 0  . DULoxetine (CYMBALTA) 30 MG capsule Take 1 capsule (30 mg total) by mouth daily. 30 capsule 3  . ferrous sulfate (FERROUSUL) 325 (65 FE) MG tablet Take 1 tablet (325 mg total) by mouth daily with breakfast. 90 tablet 3  . fluticasone (FLONASE) 50 MCG/ACT nasal spray Place 2 sprays into both nostrils daily. (Patient taking differently: Place 2 sprays into both nostrils daily as needed for allergies. ) 16 g 6  . furosemide (LASIX) 40 MG tablet Take 1 tablet (40 mg total) by mouth daily. Take extra 40 mg tablet once in the afternoon AS NEEDED for weight gain 3 lbs or more. 180 tablet 3  . gabapentin (NEURONTIN) 300 MG capsule Take 2 capsules (600 mg total) by mouth 3 (three) times daily. 540 capsule 3  . loratadine (CLARITIN) 10 MG tablet Take 1 tablet (10 mg total) by mouth daily. 90 tablet 2  . meclizine (ANTIVERT) 25 MG tablet TAKE ONE TABLET BY MOUTH THREE TIMES DAILY AS NEEDED FOR  DIZZINESS 60 tablet 0  . omeprazole (PRILOSEC) 40 MG capsule Take 1 capsule (40 mg total) by mouth 2 (two) times daily. 180 capsule 3  . potassium chloride (K-DUR,KLOR-CON) 20 MEQ tablet Take 1 tablet (20 mEq total) by mouth 2 (two) times daily. 180 tablet 3  . Riociguat (ADEMPAS) 1 MG TABS Take 1 mg by mouth 3 (three) times daily. At 0700, 1500 and 2300    . rivaroxaban (XARELTO) 20 MG TABS tablet Take 1 tablet (20 mg total) by mouth daily with supper. 90 tablet 3  . spironolactone (ALDACTONE) 25 MG tablet Take 0.5 tablets (12.5 mg total) by mouth daily. 45 tablet 3    Lab Results:  Results for orders placed or performed during the hospital encounter of 12/05/17 (from the past 48 hour(s))  Rapid urine drug screen (hospital performed)     Status: None  Collection Time: 12/05/17  8:06 PM  Result Value Ref Range   Opiates NONE DETECTED NONE DETECTED   Cocaine NONE DETECTED NONE DETECTED   Benzodiazepines NONE DETECTED NONE DETECTED    Amphetamines NONE DETECTED NONE DETECTED   Tetrahydrocannabinol NONE DETECTED NONE DETECTED   Barbiturates NONE DETECTED NONE DETECTED    Comment: (NOTE) DRUG SCREEN FOR MEDICAL PURPOSES ONLY.  IF CONFIRMATION IS NEEDED FOR ANY PURPOSE, NOTIFY LAB WITHIN 5 DAYS. LOWEST DETECTABLE LIMITS FOR URINE DRUG SCREEN Drug Class                     Cutoff (ng/mL) Amphetamine and metabolites    1000 Barbiturate and metabolites    200 Benzodiazepine                 240 Tricyclics and metabolites     300 Opiates and metabolites        300 Cocaine and metabolites        300 THC                            50 Performed at [facility], [place] [address], [place], [place] [address]   Comprehensive metabolic panel     Status: None   Collection Time: 12/05/17  8:24 PM  Result Value Ref Range   Sodium 144 135 - 145 mmol/L   Potassium 3.9 3.5 - 5.1 mmol/L   Chloride 106 101 - 111 mmol/L   CO2 28 22 - 32 mmol/L   Glucose, Bld 97 65 - 99 mg/dL   BUN 10 6 - 20 mg/dL   Creatinine, Ser 0.83 0.44 - 1.00 mg/dL   Calcium 9.9 8.9 - 10.3 mg/dL   Total Protein 7.8 6.5 - 8.1 g/dL   Albumin 4.3 3.5 - 5.0 g/dL   AST 16 15 - 41 U/L   ALT 15 14 - 54 U/L   Alkaline Phosphatase 83 38 - 126 U/L   Total Bilirubin 0.5 0.3 - 1.2 mg/dL   GFR calc non Af Amer >60 >60 mL/min   GFR calc Af Amer >60 >60 mL/min    Comment: (NOTE) The eGFR has been calculated using the CKD EPI equation. This calculation has not been validated in all clinical situations. eGFR's persistently <60 mL/min signify possible Chronic Kidney Disease.    Anion gap 10 5 - 15    Comment: Performed at [facility], [place] [address], [place], [name] [address]  Ethanol     Status: None   Collection Time: 12/05/17  8:24 PM  Result Value Ref Range   Alcohol, Ethyl (B) <10 <10 mg/dL    Comment:        LOWEST DETECTABLE LIMIT FOR SERUM ALCOHOL IS 10 mg/dL FOR MEDICAL PURPOSES ONLY Performed at [facility], [place] [address] [place], [place] [address]   Salicylate level     Status: None   Collection Time: 12/05/17  8:24 PM  Result Value Ref Range   Salicylate Lvl <1.9 2.8 - 30.0 mg/dL    Comment: Performed at [facility], [place] [address] [place], [place] [address]  Acetaminophen level     Status: Abnormal   Collection Time: 12/05/17  8:24 PM  Result Value Ref Range   Acetaminophen (Tylenol), Serum <10 (L) 10 - 30 ug/mL    Comment:        THERAPEUTIC CONCENTRATIONS VARY SIGNIFICANTLY. A RANGE OF  10-30 ug/mL MAY BE AN EFFECTIVE CONCENTRATION FOR MANY PATIENTS. HOWEVER, SOME ARE BEST TREATED AT CONCENTRATIONS OUTSIDE THIS RANGE. ACETAMINOPHEN CONCENTRATIONS >150 ug/mL AT 4 HOURS AFTER INGESTION AND >50 ug/mL AT 12 HOURS AFTER INGESTION ARE OFTEN ASSOCIATED WITH TOXIC REACTIONS. Performed at [facility], [place] [address], [place], [place] [address]   cbc     Status: Abnormal   Collection Time: 12/05/17  8:24 PM  Result Value Ref Range   WBC 7.4 4.0 - 10.5 K/uL   RBC 6.07 (H) 3.87 - 5.11 MIL/uL   Hemoglobin 12.7 12.0 - 15.0 g/dL   HCT 40.1 36.0 - 46.0 %   MCV 66.1 (L) 78.0 - 100.0 fL   MCH 20.9 (L) 26.0 - 34.0 pg   MCHC 31.7 30.0 - 36.0 g/dL   RDW 18.2 (H) 11.5 - 15.5 %   Platelets 329 150 - 400 K/uL    Comment: SPECIMEN CHECKED FOR CLOTS REPEATED TO VERIFY PLATELET COUNT CONFIRMED BY SMEAR Performed at [place] [address], [place], [place] [address]   I-stat troponin, ED     Status: None   Collection Time: 12/06/17  5:41 AM  Result Value Ref Range   Troponin i, poc 0.00 0.00 - 0.08 ng/mL   Comment 3            Comment: Due to the release kinetics of cTnI, a negative result within the first hours of the onset of symptoms does not rule out myocardial infarction with certainty. If myocardial infarction is still suspected, repeat the test at appropriate intervals.     Blood Alcohol  level:  Lab Results  Component Value Date   ETH <10 12/05/2017    Physical Findings: AIMS:  , ,  ,  ,    CIWA:    COWS:     Musculoskeletal: Strength & Muscle Tone: within normal limits Gait & Station: normal Patient leans: N/A  Psychiatric Specialty Exam: Physical Exam  Psychiatric: Judgment normal. Her mood appears anxious. Her speech is delayed. She is slowed and withdrawn. Cognition and memory are normal. She exhibits a depressed mood. She expresses suicidal ideation. She expresses suicidal plans.    Review of Systems  Constitutional: Positive for malaise/fatigue.  HENT: Negative.   Eyes: Negative.   Respiratory: Negative.   Cardiovascular: Positive for leg swelling.  Gastrointestinal: Negative.   Genitourinary: Negative.   Musculoskeletal: Negative.   Skin: Negative.   Neurological: Negative.   Endo/Heme/Allergies: Negative.   Psychiatric/Behavioral: Positive for depression and suicidal ideas.    Blood pressure (!) (P) 108/57, pulse (P) 82, temperature (P) 98.1 F (36.7 C), temperature source (P) Oral, resp. rate (P) 18, height '5\' 3"'$  (1.6 m), weight (!) 152 kg (335 lb), SpO2 (P) 93 %.Body mass index is 59.34 kg/m.  General Appearance: Casual  Eye Contact:  Good  Speech:  Clear and Coherent  Volume:  Decreased  Mood:  Depressed, Dysphoric and Hopeless  Affect:  Constricted and Depressed  Thought Process:  Coherent and Descriptions of Associations: Intact  Orientation:  Full (Time, Place, and Person)  Thought Content:  Logical  Suicidal Thoughts:  Yes.  with intent/plan  Homicidal Thoughts:  No  Memory:  Immediate;   Good Recent;   Good Remote;   Good  Judgement:  Impaired  Insight:  Shallow  Psychomotor Activity:  Decreased and Psychomotor Retardation  Concentration:  Concentration: Fair and Attention Span: Fair  Recall:  [employer] of Knowledge:  Fair  Language:  Good  Akathisia:  No  Handed:  Right  AIMS (if indicated):     Assets:  Communication  Skills Desire for Improvement  ADL's:  Intact  Cognition:  WNL  Sleep:   fair      Treatment Plan Summary:  Daily contact with patient to assess and evaluate symptoms and progress in treatment and Medication management  Continue Cymbalta  60 mg daily for depression.  Disposition: Recommend psychiatric Inpatient admission when medically cleared. Supportive therapy provided about ongoing stressors.    Corena Pilgrim, MD 12/07/2017, 10:41 AM

## 2017-12-11 ENCOUNTER — Telehealth: Payer: Self-pay | Admitting: Internal Medicine

## 2017-12-11 NOTE — Telephone Encounter (Signed)
[name] Lex NP from [facility] called to let pt PCP know that she saw pt last Friday and she told pt to go to the [facility] hospital because pt was having suicidal ideation and depression. [name] [name] states that pt agreed to go and was admitted to [facility]. Please f/u

## 2017-12-12 ENCOUNTER — Ambulatory Visit: Payer: Medicare HMO | Attending: Internal Medicine | Admitting: Internal Medicine

## 2017-12-12 ENCOUNTER — Ambulatory Visit: Payer: Self-pay | Admitting: Family Medicine

## 2017-12-12 ENCOUNTER — Ambulatory Visit: Payer: Medicare HMO | Attending: Internal Medicine | Admitting: Licensed Clinical Social Worker

## 2017-12-12 ENCOUNTER — Encounter: Payer: Self-pay | Admitting: Internal Medicine

## 2017-12-12 VITALS — BP 138/84 | HR 96 | Temp 97.8°F | Resp 16 | Wt 341.2 lb

## 2017-12-12 DIAGNOSIS — R531 Weakness: Secondary | ICD-10-CM | POA: Diagnosis not present

## 2017-12-12 DIAGNOSIS — M069 Rheumatoid arthritis, unspecified: Secondary | ICD-10-CM | POA: Insufficient documentation

## 2017-12-12 DIAGNOSIS — Z79899 Other long term (current) drug therapy: Secondary | ICD-10-CM | POA: Diagnosis not present

## 2017-12-12 DIAGNOSIS — I2721 Secondary pulmonary arterial hypertension: Secondary | ICD-10-CM | POA: Diagnosis not present

## 2017-12-12 DIAGNOSIS — Z6841 Body Mass Index (BMI) 40.0 and over, adult: Secondary | ICD-10-CM | POA: Diagnosis not present

## 2017-12-12 DIAGNOSIS — F332 Major depressive disorder, recurrent severe without psychotic features: Secondary | ICD-10-CM

## 2017-12-12 DIAGNOSIS — J181 Lobar pneumonia, unspecified organism: Secondary | ICD-10-CM | POA: Diagnosis not present

## 2017-12-12 DIAGNOSIS — D509 Iron deficiency anemia, unspecified: Secondary | ICD-10-CM | POA: Insufficient documentation

## 2017-12-12 DIAGNOSIS — G4733 Obstructive sleep apnea (adult) (pediatric): Secondary | ICD-10-CM | POA: Insufficient documentation

## 2017-12-12 DIAGNOSIS — Z9889 Other specified postprocedural states: Secondary | ICD-10-CM | POA: Insufficient documentation

## 2017-12-12 DIAGNOSIS — R490 Dysphonia: Secondary | ICD-10-CM | POA: Diagnosis not present

## 2017-12-12 DIAGNOSIS — F419 Anxiety disorder, unspecified: Secondary | ICD-10-CM | POA: Insufficient documentation

## 2017-12-12 DIAGNOSIS — F4321 Adjustment disorder with depressed mood: Secondary | ICD-10-CM | POA: Diagnosis not present

## 2017-12-12 DIAGNOSIS — I2724 Chronic thromboembolic pulmonary hypertension: Secondary | ICD-10-CM | POA: Insufficient documentation

## 2017-12-12 DIAGNOSIS — I110 Hypertensive heart disease with heart failure: Secondary | ICD-10-CM | POA: Diagnosis not present

## 2017-12-12 DIAGNOSIS — R42 Dizziness and giddiness: Secondary | ICD-10-CM | POA: Insufficient documentation

## 2017-12-12 DIAGNOSIS — F4323 Adjustment disorder with mixed anxiety and depressed mood: Secondary | ICD-10-CM

## 2017-12-12 DIAGNOSIS — R45851 Suicidal ideations: Secondary | ICD-10-CM | POA: Diagnosis not present

## 2017-12-12 DIAGNOSIS — G894 Chronic pain syndrome: Secondary | ICD-10-CM | POA: Diagnosis not present

## 2017-12-12 DIAGNOSIS — I5032 Chronic diastolic (congestive) heart failure: Secondary | ICD-10-CM | POA: Insufficient documentation

## 2017-12-12 DIAGNOSIS — Z7901 Long term (current) use of anticoagulants: Secondary | ICD-10-CM | POA: Diagnosis not present

## 2017-12-12 NOTE — BH Specialist Note (Signed)
Integrated Behavioral Health Follow Up Visit  MRN: [id number] Name: [name] [name] [facility]   Session Start time: 10:30 AM  Session End time: 11:00 AM Total time: 30 minutes Number of [place] Clinician visits: 3/6  Type of Service: [place] Interpretor:No. Interpretor Name and Language: N/A    Warm Hand Off Completed.       SUBJECTIVE: [name] [name] is a 57 y.o. female accompanied by self. Patient was referred by Dr. [name] for depression and anxiety. Patient reports the following symptoms/concerns: overwhelming feelings of sadness and worry, difficulty sleeping, low energy, feeling bad about self, decreased concentration, irritability, and hx of suicidal ideations  Duration of problem: Ongoing; Severity of problem: severe  OBJECTIVE: Mood: Depressed and Irritable and Affect: Depressed Risk of harm to self or others: No plan to harm self or others   LIFE CONTEXT: Family and Social: Pt resides with her spouse and minor grandson. Pt has an adult son who travels out of the state often for employer. School/Work: Pt and spouse are on a fixed income. Pt receives Medicaid for minor grandchild Self-Care: Pt participates in medication management through PCP  Life Changes: Pt has ongoing medical health conditions. She was recently hospitalized due to [place] and participated in treatment at [place]: Patient will reduce symptoms of: anxiety, depression and stress and increase knowledge and/or ability of: coping skills, healthy habits and stress reduction and also: Increase adequate support systems for patient/family and Begin healthy grieving over loss  INTERVENTIONS: Solution-Focused Strategies, Supportive Counseling, Psychoeducation and/or Health Education and Link to [employer] Standardized Assessments completed: GAD-7 and PHQ 2&9 with C-SSRS  ASSESSMENT: Pt currently experiencing  depression and anxiety triggered by decline in physical health, chronic pain, and grief of loved ones. Pt reports overwhelming feelings of sadness and worry, difficulty sleeping, low energy, feeling bad about self, decreased concentration, irritability, and hx of suicidal ideations. Pt denied current SI/HI/AVH. She is knowledgable of crisis resources and has an upcoming appt with a psychiatrist Monday, April 8th.  Pt may benefit from psychoeducation and psychotherapy. She participates in medication management through PCP. [place] educated pt on stages of grief. Pt reported that she does not feel supported by providers and is unable to afford copays for psychotherapy. LCSWA discussed benefits of strengthening support system to cope with stressors. Pt is willing to initiate psychotherapy with hospice to discuss grief and made plan to utilize coping skills learned in treatment. She is aware of community resources for transportation, food instability, crisis intervention, therapy, and medication management.  PLAN: 1. Follow up with behavioral health clinician on : Pt was encouraged tocontact LCSWA if symptoms worsen or fail to improveto schedule behavioral appointments at [facility]. 2. Behavioral recommendations: LCSWA recommends that pt apply healthy coping skills discussed and utilize provided resources. Pt is encouraged to schedule follow up appointment with LCSWA 3. Referral(s): Armed forces logistics/support/administrative officer (LME/Outside Clinic) and Hospice for Grief Support 4. "From scale of 1-10, how likely are you to follow plan?": 7/10  Rebekah [place], LCSW 12/12/17 5:26 PM

## 2017-12-12 NOTE — Progress Notes (Signed)
Patient ID: [name] [name], female    DOB: 10-10-1960  MRN: [id number]  CC: depression  Subjective: [name] [name] is a 57 y.o. 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  Depression worsened over past few mths.  She was having a hard time dealing with her chronic illnesses and some stressors at home with a grandson with mental issues.  Recent suicidal death of an uncle and sudden death of a close cousin made it worse. She felt hopeless.  She cancelled all future appts with her physicians and stopped PCS services from [place].  Dischg yesterday. Pt reports she is in a better place mentally and would have stayed longer but wanted to attend a close cousin's funeral this wkend. New meds are Trintellix, Ambien, Melatonin, Xanax and Flexeril.  She was on Cymbalta and Buspar prior to psychiatric admission.  Pt reports Cymbalta was d/c.  Has appt with psychiatrist on Monday.  Was able to get her grandson enrolled in a day program today.  Her husband is scheduled to have knee replacement surgery next wk    Patient Active Problem List   Diagnosis Date Noted  . Major depressive disorder, recurrent severe without psychotic features ([place]) 12/06/2017  . CHF exacerbation ([place]) 08/21/2017  . (HFpEF) heart failure with preserved ejection fraction ([place]) 08/20/2017  . Lumbar radiculopathy 08/08/2017  . Morbid obesity with BMI of 50.0-59.9, adult ([place]) 08/08/2017  . Hoarseness 08/08/2017  . Chronic cough 04/10/2017  . Rheumatoid arthritis involving multiple sites ([place])  04/10/2017  . Anxiety and depression 04/10/2017  . Adjustment disorder with depressed mood 12/11/2016  . Morbid obesity ([place]) 08/14/2016  . Stroke ([place]) 06/27/2016  . Right sided weakness 06/27/2016  . Essential hypertension 06/27/2016  . Atypical chest pain 06/27/2016  . Difficult airway 03/29/2016  . CTEPH (chronic thromboembolic pulmonary hypertension) ([place]) 03/14/2016  . OSA on CPAP 02/20/2016  . Chronic pain syndrome 01/31/2016  . Pulmonary embolus ([place]) 11/15/2015  . Vertigo 11/15/2015  . Depression 11/15/2015     Current Outpatient Medications on File Prior to Visit  Medication Sig Dispense Refill  . albuterol (PROVENTIL HFA;VENTOLIN HFA) 108 (90 Base) MCG/ACT inhaler Inhale 1-2 puffs into the lungs every 6 (six) hours as needed for wheezing. 3 Inhaler 6  . ALPRAZolam (XANAX) 1 MG tablet Take 1 mg by mouth as needed for anxiety.    Marland Kitchen atorvastatin (LIPITOR) 20 MG tablet Take 1 tablet (20 mg total) by mouth daily at 6 PM. 90 tablet 3  . benzonatate (TESSALON) 100 MG capsule Take 1 capsule (100 mg total) by mouth every 8 (eight) hours. 21 capsule 0  . busPIRone (BUSPAR) 5 MG tablet Take 1 tablet (5 mg total) by mouth 2 (two) times daily. 180 tablet 3  . cyclobenzaprine (FLEXERIL) 10 MG tablet Take 10 mg by mouth at bedtime.    . cyclobenzaprine (FLEXERIL) 5 MG tablet Take 1 tablet (5 mg total) by mouth 2 (two) times daily as needed. for muscle spams 40 tablet 1  . docusate sodium (COLACE) 100 MG capsule Take 1 capsule (100 mg total) by mouth every 12 (twelve)  hours. 30 capsule 0  . DULoxetine (CYMBALTA) 30 MG capsule Take 1 capsule (30 mg total) by mouth daily. 30 capsule 3  . ferrous sulfate (FERROUSUL) 325 (65 FE) MG tablet Take 1 tablet (325 mg total) by mouth daily with breakfast. 90 tablet 3  . fluticasone (FLONASE) 50 MCG/ACT nasal spray Place 2 sprays into both nostrils daily. (Patient taking differently: Place 2 sprays into both nostrils daily as needed for allergies. ) 16 g  6  . furosemide (LASIX) 40 MG tablet Take 1 tablet (40 mg total) by mouth daily. Take extra 40 mg tablet once in the afternoon AS NEEDED for weight gain 3 lbs or more. 180 tablet 3  . gabapentin (NEURONTIN) 300 MG capsule Take 2 capsules (600 mg total) by mouth 3 (three) times daily. 540 capsule 3  . loratadine (CLARITIN) 10 MG tablet Take 1 tablet (10 mg total) by mouth daily. 90 tablet 2  . meclizine (ANTIVERT) 25 MG tablet TAKE ONE TABLET BY MOUTH THREE TIMES DAILY AS NEEDED FOR  DIZZINESS 60 tablet 0  . omeprazole (PRILOSEC) 40 MG capsule Take 1 capsule (40 mg total) by mouth 2 (two) times daily. 180 capsule 3  . potassium chloride (K-DUR,KLOR-CON) 20 MEQ tablet Take 1 tablet (20 mEq total) by mouth 2 (two) times daily. 180 tablet 3  . Riociguat (ADEMPAS) 1 MG TABS Take 1 mg by mouth 3 (three) times daily. At 0700, 1500 and 2300    . rivaroxaban (XARELTO) 20 MG TABS tablet Take 1 tablet (20 mg total) by mouth daily with supper. 90 tablet 3  . spironolactone (ALDACTONE) 25 MG tablet Take 0.5 tablets (12.5 mg total) by mouth daily. 45 tablet 3  . vortioxetine HBr (TRINTELLIX) 10 MG TABS tablet Take 10 mg by mouth daily.    Marland Kitchen zolpidem (AMBIEN) 10 MG tablet Take 10 mg by mouth at bedtime as needed for sleep.     No current facility-administered medications on file prior to visit.     No Known Allergies  Social History   Socioeconomic History  . Marital status: Married    Spouse name: Not on file  . Number of children: Not on file  . Years of education: Not on file  . Highest education level: Not on file  Occupational History  . Not on file  Social Needs  . Financial resource strain: Not on file  . Food insecurity:    Worry: Not on file    Inability: Not on file  . Transportation needs:    Medical: Not on file    Non-medical: Not on file  Tobacco Use  . Smoking status: Never Smoker  . Smokeless tobacco: Never Used  Substance and Sexual Activity  . Alcohol use: No  . Drug use:  No  . Sexual activity: Yes    Partners: Male    Birth control/protection: Surgical  Lifestyle  . Physical activity:    Days per week: Not on file    Minutes per session: Not on file  . Stress: Not on file  Relationships  . Social connections:    Talks on phone: Not on file    Gets together: Not on file    Attends religious service: Not on file    Active member of club or organization: Not on file    Attends meetings of clubs or organizations: Not on file    Relationship status: Not on file  . Intimate partner violence:    Fear of current or ex partner:  Not on file    Emotionally abused: Not on file    Physically abused: Not on file    Forced sexual activity: Not on file  Other Topics Concern  . Not on file  Social History Narrative  . Not on file    Family History  Problem Relation Age of Onset  . Lung cancer Maternal Grandmother   . Dementia Mother   . Dementia Father   . Anxiety disorder Sister   . Bipolar disorder Sister   . Drug abuse Sister   . Sexual abuse Maternal Aunt   . Anxiety disorder Cousin   . Anxiety disorder Sister   . Bipolar disorder Sister   . Drug abuse Sister   . Breast cancer Neg Hx     Past Surgical History:  Procedure Laterality Date  . ABDOMINAL HYSTERECTOMY    . BREAST REDUCTION SURGERY    . CHOLECYSTECTOMY    . EMBOLECTOMY N/A    pulmonary embolectomy  . REDUCTION MAMMAPLASTY Bilateral   . RIGHT HEART CATH N/A 10/28/2016   Procedure: Right Heart Cath;  Surgeon: Jolaine Artist, MD;  Location: [facility];  Service: Cardiovascular;  Laterality: N/A;  . RIGHT HEART CATH N/A 08/26/2017   Procedure: RIGHT HEART CATH;  Surgeon: Jolaine Artist, MD;  Location: [facility];  Service: Cardiovascular;  Laterality: N/A;  . ULTRASOUND GUIDANCE FOR VASCULAR ACCESS  08/26/2017   Procedure: Ultrasound Guidance For Vascular Access;  Surgeon: Jolaine Artist, MD;  Location: [facility];  Service: Cardiovascular;;     ROS: Review of Systems  PHYSICAL EXAM: BP 138/84   Pulse 96   Temp 97.8 F (36.6 C) (Oral)   Resp 16   Wt (!) 341 lb 3.2 oz (154.8 kg)   SpO2 96%   BMI 60.44 kg/m   Physical Exam  General appearance - alert, well appearing, and in no distress Mental status - alert, oriented to person, place, and time, normal mood, behavior, speech, dress, motor activity, and thought processes    ASSESSMENT AND PLAN: 1. Severe episode of recurrent major depressive disorder, without psychotic features ([place]) -commended her for seeking and getting the help that she needs.  Advised to keep appt with psychiatrist on Monday and take meds with her  Seen by LCSW today  2. Upper lobe consolidation ([place]) I think this Percival Spanish was in the system as a future lab from pulmonary  - QuantiFERON-TB Gold Plus  3.  English Will arrange f/u with her cardiologist.  She had cancelled her last appt Patient was given the opportunity to ask questions.  Patient verbalized understanding of the plan and was able to repeat key elements of the plan.   No orders of the defined types were placed in this encounter.    Requested Prescriptions    No prescriptions requested or ordered in this encounter    No follow-ups on file.  Karle Plumber, MD, FACP

## 2017-12-14 DIAGNOSIS — R0602 Shortness of breath: Secondary | ICD-10-CM | POA: Diagnosis not present

## 2017-12-14 DIAGNOSIS — I272 Pulmonary hypertension, unspecified: Secondary | ICD-10-CM | POA: Diagnosis not present

## 2017-12-14 DIAGNOSIS — I2724 Chronic thromboembolic pulmonary hypertension: Secondary | ICD-10-CM | POA: Diagnosis not present

## 2017-12-14 DIAGNOSIS — G4733 Obstructive sleep apnea (adult) (pediatric): Secondary | ICD-10-CM | POA: Diagnosis not present

## 2017-12-15 DIAGNOSIS — F332 Major depressive disorder, recurrent severe without psychotic features: Secondary | ICD-10-CM | POA: Diagnosis not present

## 2017-12-17 ENCOUNTER — Telehealth: Payer: Self-pay | Admitting: Internal Medicine

## 2017-12-17 ENCOUNTER — Telehealth (HOSPITAL_COMMUNITY): Payer: Self-pay

## 2017-12-17 NOTE — Telephone Encounter (Signed)
[facility] called to say that a referral was place in their workque which might be an error, please follow up and give them a call with further details on that referral [phone]

## 2017-12-17 NOTE — Telephone Encounter (Signed)
I called [name] [name] to schedule an appointment. We were unable to meet last week because she was in the hospital to address her mental health but she said she was "feeling a little better." I asked when I could come see her and she stated she was not feeling up to it today and her husband has surgery tomorrow so she would be at the hospital with him. She said she would call me back either Thursday evening or Friday to set a time for a visit.

## 2017-12-23 DIAGNOSIS — F332 Major depressive disorder, recurrent severe without psychotic features: Secondary | ICD-10-CM | POA: Diagnosis not present

## 2017-12-25 ENCOUNTER — Other Ambulatory Visit (HOSPITAL_COMMUNITY): Payer: Self-pay | Admitting: General Practice

## 2017-12-25 ENCOUNTER — Telehealth: Payer: Self-pay | Admitting: Internal Medicine

## 2017-12-25 ENCOUNTER — Other Ambulatory Visit (HOSPITAL_COMMUNITY): Payer: Self-pay

## 2017-12-25 NOTE — Telephone Encounter (Signed)
Faxed this afternoon!

## 2017-12-25 NOTE — Telephone Encounter (Signed)
Manuela Schwartz from [facility] called and requested for a porta-cath flush order to be put in for the patients visit tomorrow with them. Please fu at your earliest convenience. Manuela Schwartz stated it could be faxed to [phone].

## 2017-12-25 NOTE — Progress Notes (Signed)
Paramedicine Encounter    Patient ID: [name] [name], female    DOB: 1961/06/26, 57 y.o.   MRN: [id number]   Patient Care Team: Ladell Pier, MD as PCP - General (Internal Medicine)  Patient Active Problem List   Diagnosis Date Noted  . Major depressive disorder, recurrent severe without psychotic features ([place]) 12/06/2017  . CHF exacerbation ([place]) 08/21/2017  . (HFpEF) heart failure with preserved ejection fraction ([place]) 08/20/2017  . Lumbar radiculopathy 08/08/2017  . Morbid obesity with BMI of 50.0-59.9, adult ([place]) 08/08/2017  . Hoarseness 08/08/2017  . Chronic cough 04/10/2017  . Rheumatoid arthritis involving multiple sites ([place]) 04/10/2017  . Anxiety and depression 04/10/2017  . Adjustment disorder with depressed mood 12/11/2016  . Morbid obesity ([place]) 08/14/2016  . Stroke ([place]) 06/27/2016  . Right sided weakness 06/27/2016  . Essential hypertension 06/27/2016  . Atypical chest pain 06/27/2016  . Difficult airway 03/29/2016  . CTEPH (chronic thromboembolic pulmonary hypertension) ([place]) 03/14/2016  . OSA on CPAP 02/20/2016  . Chronic pain syndrome 01/31/2016  . Pulmonary embolus ([place]) 11/15/2015  . Vertigo 11/15/2015  . Depression 11/15/2015    Current Outpatient Medications:  .  albuterol (PROVENTIL HFA;VENTOLIN HFA) 108 (90 Base) MCG/ACT inhaler, Inhale 1-2 puffs into the lungs every 6 (six) hours as needed for wheezing., Disp: 3 Inhaler, Rfl: 6 .  ALPRAZolam (XANAX) 1 MG tablet, Take 1 mg by mouth as needed for anxiety., Disp: , Rfl:  .  atorvastatin (LIPITOR) 20 MG tablet, Take 1 tablet (20 mg total) by mouth daily at 6 PM., Disp: 90 tablet, Rfl: 3 .  benzonatate (TESSALON) 100 MG capsule, Take 1 capsule (100 mg total) by mouth every 8 (eight) hours., Disp: 21 capsule, Rfl: 0 .  busPIRone (BUSPAR) 5 MG tablet, Take 1 tablet (5 mg total) by mouth 2 (two) times daily., Disp: 180 tablet, Rfl: 3 .  cyclobenzaprine (FLEXERIL) 10 MG tablet, Take 10 mg by mouth  at bedtime., Disp: , Rfl:  .  docusate sodium (COLACE) 100 MG capsule, Take 1 capsule (100 mg total) by mouth every 12 (twelve) hours., Disp: 30 capsule, Rfl: 0 .  ferrous sulfate (FERROUSUL) 325 (65 FE) MG tablet, Take 1 tablet (325 mg total) by mouth daily with breakfast., Disp: 90 tablet, Rfl: 3 .  fluticasone (FLONASE) 50 MCG/ACT nasal spray, Place 2 sprays into both nostrils daily. (Patient taking differently: Place 2 sprays into both nostrils daily as needed for allergies. ), Disp: 16 g, Rfl: 6 .  furosemide (LASIX) 40 MG tablet, Take 1 tablet (40 mg total) by mouth daily. Take extra 40 mg tablet once in the afternoon AS NEEDED for weight gain 3 lbs or more., Disp: 180 tablet, Rfl: 3 .  gabapentin (NEURONTIN) 300 MG capsule, Take 2 capsules (600 mg total) by mouth 3 (three) times daily., Disp: 540 capsule, Rfl: 3 .  loratadine (CLARITIN) 10 MG tablet, Take 1 tablet (10 mg total) by mouth daily., Disp: 90 tablet, Rfl: 2 .  meclizine (ANTIVERT) 25 MG tablet, TAKE ONE TABLET BY MOUTH THREE TIMES DAILY AS NEEDED FOR  DIZZINESS, Disp: 60 tablet, Rfl: 0 .  omeprazole (PRILOSEC) 40 MG capsule, Take 1 capsule (40 mg total) by mouth 2 (two) times daily., Disp: 180 capsule, Rfl: 3 .  potassium chloride (K-DUR,KLOR-CON) 20 MEQ tablet, Take 1 tablet (20 mEq total) by mouth 2 (two) times daily., Disp: 180 tablet, Rfl: 3 .  Riociguat (ADEMPAS) 1 MG TABS, Take 1 mg by mouth 3 (three) times daily.  At 0700, 1500 and 2300, Disp: , Rfl:  .  rivaroxaban (XARELTO) 20 MG TABS tablet, Take 1 tablet (20 mg total) by mouth daily with supper., Disp: 90 tablet, Rfl: 3 .  spironolactone (ALDACTONE) 25 MG tablet, Take 0.5 tablets (12.5 mg total) by mouth daily., Disp: 45 tablet, Rfl: 3 .  vortioxetine HBr (TRINTELLIX) 10 MG TABS tablet, Take 10 mg by mouth daily., Disp: , Rfl:  .  zolpidem (AMBIEN) 10 MG tablet, Take 10 mg by mouth at bedtime as needed for sleep., Disp: , Rfl:  .  cyclobenzaprine (FLEXERIL) 5 MG tablet,  Take 1 tablet (5 mg total) by mouth 2 (two) times daily as needed. for muscle spams, Disp: 40 tablet, Rfl: 1 No Known Allergies    Social History   Socioeconomic History  . Marital status: Married    Spouse name: Not on file  . Number of children: Not on file  . Years of education: Not on file  . Highest education level: Not on file  Occupational History  . Not on file  Social Needs  . Financial resource strain: Not on file  . Food insecurity:    Worry: Not on file    Inability: Not on file  . Transportation needs:    Medical: Not on file    Non-medical: Not on file  Tobacco Use  . Smoking status: Never Smoker  . Smokeless tobacco: Never Used  Substance and Sexual Activity  . Alcohol use: No  . Drug use: No  . Sexual activity: Yes    Partners: Male    Birth control/protection: Surgical  Lifestyle  . Physical activity:    Days per week: Not on file    Minutes per session: Not on file  . Stress: Not on file  Relationships  . Social connections:    Talks on phone: Not on file    Gets together: Not on file    Attends religious service: Not on file    Active member of club or organization: Not on file    Attends meetings of clubs or organizations: Not on file    Relationship status: Not on file  . Intimate partner violence:    Fear of current or ex partner: Not on file    Emotionally abused: Not on file    Physically abused: Not on file    Forced sexual activity: Not on file  Other Topics Concern  . Not on file  Social History Narrative  . Not on file    Physical Exam  Constitutional: She is oriented to person, place, and time.  Cardiovascular: Regular rhythm. Tachycardia present.  Pulmonary/Chest: Effort normal and breath sounds normal.  Abdominal: Soft. She exhibits no distension.  Musculoskeletal: Normal range of motion. She exhibits edema.  Neurological: She is alert and oriented to person, place, and time.  Skin: Skin is warm and dry.  Psychiatric: She  has a normal mood and affect.        Future Appointments  Date Time Provider [place]  12/26/2017 11:00 AM [facility] ROOM [facility] None  01/08/2018  1:50 PM Ladell Pier, MD [facility] None  01/26/2018  9:00 AM [facility] ROOM [facility] None    BP 116/78 (BP Location: Left Arm, Patient Position: Sitting, Cuff Size: Large)   Pulse 100   Resp 16   Wt (!) 344 lb (156 kg)   SpO2 98%   BMI 60.94 kg/m   Weight yesterday- 339 lb Last visit weight- 335 lb  [name] [name]  was seen at home today for the first time since returning from her stay at a behavioral health hospital in [place]. It does not seem her moods have improved and she told me that she left too early. I asked why she left if she was not ready and she stated it was to attend her cousin's funeral however she did not end up attending because she was sick. She continues to complain of pain in her legs but got very defensive when I suggested she see a pain specialist. She also stated that she has gained substantial weight over the past few days even while watching her diet and taking extra furosemide. She says she is also not urinating very often which concerns her because of the amount of diuretic she has been taking I will contact the clinic in the morning to seek guidance regarding this situation. He medications were verified and her pillbox was refilled.   Time spent with patient: 38 minutes  [name], EMT 12/25/17  ACTION: Home visit completed Next visit planned for 1 week

## 2017-12-26 ENCOUNTER — Encounter (HOSPITAL_COMMUNITY)
Admission: RE | Admit: 2017-12-26 | Discharge: 2017-12-26 | Disposition: A | Discharge status: Home / Self Care | Payer: Medicare HMO | Source: Ambulatory Visit | Attending: Internal Medicine | Admitting: Internal Medicine

## 2017-12-26 ENCOUNTER — Telehealth (HOSPITAL_COMMUNITY): Payer: Self-pay | Admitting: Cardiology

## 2017-12-26 ENCOUNTER — Other Ambulatory Visit (HOSPITAL_COMMUNITY): Payer: Self-pay | Admitting: *Deleted

## 2017-12-26 ENCOUNTER — Encounter (HOSPITAL_COMMUNITY): Payer: Self-pay

## 2017-12-26 ENCOUNTER — Telehealth (HOSPITAL_COMMUNITY): Payer: Self-pay | Admitting: *Deleted

## 2017-12-26 DIAGNOSIS — I2782 Chronic pulmonary embolism: Secondary | ICD-10-CM | POA: Diagnosis not present

## 2017-12-26 DIAGNOSIS — Z452 Encounter for adjustment and management of vascular access device: Secondary | ICD-10-CM | POA: Insufficient documentation

## 2017-12-26 DIAGNOSIS — I5022 Chronic systolic (congestive) heart failure: Secondary | ICD-10-CM | POA: Insufficient documentation

## 2017-12-26 LAB — BASIC METABOLIC PANEL
Anion gap: 8 (ref 5–15)
BUN: 16 mg/dL (ref 6–20)
CO2: 25 mmol/L (ref 22–32)
Calcium: 9.2 mg/dL (ref 8.9–10.3)
Chloride: 109 mmol/L (ref 101–111)
Creatinine, Ser: 1.08 mg/dL — ABNORMAL HIGH (ref 0.44–1.00)
GFR calc Af Amer: >60 mL/min (ref >60)
GFR calc non Af Amer: 57 mL/min — ABNORMAL LOW (ref >60)
Glucose, Bld: 104 mg/dL — ABNORMAL HIGH (ref 65–99)
Potassium: 5.4 mmol/L — ABNORMAL HIGH (ref 3.5–5.1)
Sodium: 142 mmol/L (ref 135–145)

## 2017-12-26 LAB — BRAIN NATRIURETIC PEPTIDE: B Natriuretic Peptide: 14 pg/mL (ref 0.0–100.0)

## 2017-12-26 MED ORDER — HEPARIN SOD (PORK) LOCK FLUSH 100 UNIT/ML IV SOLN
500.0000 [IU] | INTRAVENOUS | Status: DC
  Administered 2017-12-26: 500 [IU] via INTRAVENOUS
  Filled 2017-12-26: qty 5

## 2017-12-26 MED ORDER — SODIUM CHLORIDE 0.9% FLUSH
10.0000 mL | INTRAVENOUS | Status: DC
  Administered 2017-12-26: 10 mL via INTRAVENOUS
  Filled 2017-12-26: qty 10

## 2017-12-26 NOTE — Progress Notes (Signed)
Patient here in [facility] short stay for port flush. She stated the heart failure clinic wanted blood drawn. Spoke to Lorraine at heart failure clinic and she had entered labs in epic for me to draw (B nat peptide and Basic metabolic panel) - they are visible to me only under order history. I will re-enter these labs so able to release them and lab can perform needed testing.

## 2017-12-26 NOTE — Progress Notes (Signed)
Patient here in short stay for port flush. Dr. Durenda Age office informed us she will be coming with tubes for quanitferon tb gold to be drawn at the same time. Patient arrived with tubes and order. Spoke to Harrison in lab and transcribed order into Epic.

## 2017-12-26 NOTE — Telephone Encounter (Signed)
Notes recorded by Kerry Dory, CMA on 12/26/2017 at 3:30 PM EDT Patient aware.  ------  Notes recorded by Shirley Friar, PA-C on 12/26/2017 at 3:07 PM EDT Her Creatinine is stable and BNP not elevated.    STOP daily K supp.    Legrand Como [address] [place], PA-C 12/26/2017 3:07 PM

## 2017-12-26 NOTE — Telephone Encounter (Signed)
Zack called to report patient has swelling in lower legs, weight up, and low urine output.  She took lasix 40bid for 3 days. Per Jonni Sanger have bmet bnp drawn and schedule an appt. Pt had labs drawn today and scheduled with APP Monday.

## 2017-12-26 NOTE — Progress Notes (Signed)
Patient here for port flush. Attempted x2 RN in short stay. Patient "tasted" the normal saline as she states she typically does when it is in the right place. No blood return noted. Called IV team (see prior note). IV RN suggested for next port flush appointment to have order for TPA installation in case unable to get blood return again.   Therefore, labs for heart failure clinic were able to be drawn peripherally. Not enough blood obtained to be able to send for tb quanitiferon as requested by Dr. [name] at [facility].   Call placed to [facility] to make aware of above. Closed this afternoon for holiday and will call again Monday for future orders for tpa (if needed) and if need to do tb quantiferon (patient thought she had this done and a negative result done recently).   Patient quite concerned she would miss her [employer] transportation today back home. Typically she comes in a private car with her husband but he is in rehab facility currently. She is aware of all above and verbalizes understanding that if tpa needs to be infused next time she will be here longer. States that is fine as she won't be relying on SCAT next time. Patient escorted to lobby by RN to wait for her ride. Patient was appreciative that labs needed for Monday's appt were able to be drawn.

## 2017-12-26 NOTE — Progress Notes (Signed)
VAST/IV Responding Ref: [name] [name] if Port is functioning   Port was accessed with 1.5" Device, no initial blood return, however, a small amount (<23ml) of return was appreciated after a 24ml flush. Device was flushed with heparin. Patient may benefit from a TPA dwell at her next appointment. RN Manuela Schwartz Notified. Patient was deaccessed and transported out by [employer].

## 2017-12-26 NOTE — Telephone Encounter (Signed)
-----   Message from Shirley Friar, PA-C sent at 12/26/2017  3:07 PM EDT ----- Her Creatinine is stable and BNP not elevated.     STOP daily K supp.    Legrand Como [address] [place], PA-C 12/26/2017 3:07 PM

## 2017-12-29 ENCOUNTER — Encounter (HOSPITAL_COMMUNITY): Payer: Self-pay

## 2017-12-29 ENCOUNTER — Ambulatory Visit (HOSPITAL_COMMUNITY)
Admission: RE | Admit: 2017-12-29 | Discharge: 2017-12-29 | Disposition: A | Discharge status: Home / Self Care | Payer: Medicare HMO | Source: Ambulatory Visit | Attending: Internal Medicine | Admitting: Internal Medicine

## 2017-12-29 ENCOUNTER — Telehealth (HOSPITAL_COMMUNITY): Payer: Self-pay | Admitting: Pharmacist

## 2017-12-29 VITALS — BP 96/64 | HR 86 | Wt 346.4 lb

## 2017-12-29 DIAGNOSIS — E785 Hyperlipidemia, unspecified: Secondary | ICD-10-CM | POA: Diagnosis not present

## 2017-12-29 DIAGNOSIS — Z86711 Personal history of pulmonary embolism: Secondary | ICD-10-CM | POA: Diagnosis not present

## 2017-12-29 DIAGNOSIS — I2724 Chronic thromboembolic pulmonary hypertension: Secondary | ICD-10-CM | POA: Diagnosis not present

## 2017-12-29 DIAGNOSIS — G4733 Obstructive sleep apnea (adult) (pediatric): Secondary | ICD-10-CM | POA: Diagnosis not present

## 2017-12-29 DIAGNOSIS — M797 Fibromyalgia: Secondary | ICD-10-CM | POA: Diagnosis not present

## 2017-12-29 DIAGNOSIS — Z9689 Presence of other specified functional implants: Secondary | ICD-10-CM | POA: Insufficient documentation

## 2017-12-29 DIAGNOSIS — Z7901 Long term (current) use of anticoagulants: Secondary | ICD-10-CM | POA: Diagnosis not present

## 2017-12-29 DIAGNOSIS — G8929 Other chronic pain: Secondary | ICD-10-CM | POA: Insufficient documentation

## 2017-12-29 DIAGNOSIS — E875 Hyperkalemia: Secondary | ICD-10-CM | POA: Diagnosis not present

## 2017-12-29 DIAGNOSIS — R0600 Dyspnea, unspecified: Secondary | ICD-10-CM

## 2017-12-29 DIAGNOSIS — I110 Hypertensive heart disease with heart failure: Secondary | ICD-10-CM | POA: Diagnosis not present

## 2017-12-29 DIAGNOSIS — R0609 Other forms of dyspnea: Secondary | ICD-10-CM | POA: Diagnosis not present

## 2017-12-29 DIAGNOSIS — Z6841 Body Mass Index (BMI) 40.0 and over, adult: Secondary | ICD-10-CM | POA: Insufficient documentation

## 2017-12-29 DIAGNOSIS — R45851 Suicidal ideations: Secondary | ICD-10-CM | POA: Diagnosis not present

## 2017-12-29 DIAGNOSIS — I959 Hypotension, unspecified: Secondary | ICD-10-CM | POA: Diagnosis not present

## 2017-12-29 DIAGNOSIS — F419 Anxiety disorder, unspecified: Secondary | ICD-10-CM | POA: Diagnosis not present

## 2017-12-29 DIAGNOSIS — F329 Major depressive disorder, single episode, unspecified: Secondary | ICD-10-CM | POA: Diagnosis not present

## 2017-12-29 DIAGNOSIS — Z9889 Other specified postprocedural states: Secondary | ICD-10-CM | POA: Diagnosis not present

## 2017-12-29 DIAGNOSIS — I272 Pulmonary hypertension, unspecified: Secondary | ICD-10-CM

## 2017-12-29 DIAGNOSIS — Z79899 Other long term (current) drug therapy: Secondary | ICD-10-CM | POA: Insufficient documentation

## 2017-12-29 DIAGNOSIS — I5032 Chronic diastolic (congestive) heart failure: Secondary | ICD-10-CM | POA: Diagnosis not present

## 2017-12-29 DIAGNOSIS — R079 Chest pain, unspecified: Secondary | ICD-10-CM | POA: Insufficient documentation

## 2017-12-29 DIAGNOSIS — I5030 Unspecified diastolic (congestive) heart failure: Secondary | ICD-10-CM | POA: Diagnosis not present

## 2017-12-29 MED ORDER — MECLIZINE HCL 25 MG PO TABS: ORAL_TABLET | ORAL | 0 refills | Status: DC

## 2017-12-29 NOTE — Telephone Encounter (Signed)
Patient seen in clinic today and stated that she has only been taking Adempas 1 mg TID.

## 2017-12-29 NOTE — Progress Notes (Addendum)
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   Patient was in her [place] until 08/2015 when she started getting symptoms of dyspnea on exertion, dizziness, bilateral LE swelling, and constant R>L non-specific chest pain. These symptoms progressively worsened over several days prompting her to go to [facility] in her then-hometown of [place] in 09/2015 where she was diagnosed with PE and started on anticoagulation. She was very briefly on warfarin but quickly switched to Xarelto. She was referred to [facility] in 12/2015. chocardiogram (12-2015) showed normal LVEF 55-60%, normal LA, normal RV size, RVSP estimated as 70 mm Hg. She further was referred to [facility] and saw Dr. Karena Addison on 03/14/2016 where patient had several studies including V/Q scan which found high prob for PE; studies c/w CTEPH. It was felt that her chronic chest pain was caused by chronic PE. RHC/LHC/Pulm Angiogram 03/2016 confirmed diagnosis of CTEPH. She underwent pulmonary thromboembolectomy 03/31/2016 at [facility] with IVC filter placement. Pre-op cath with no CAD.   Seen by Dr. Karena Addison in 04/2016 for follow up. He felt like her heart size was decreasing. Plan was for lifelong [place] and ECHO and V/Q in 4 months. Dr. Karena Addison has since left the [place] practice.  CT angio at [facility] on 05/15/16 showed " persistent but partially recanalized PE within right lower lobe pulmonary artery. No new PE identified. "  She was admitted to [facility] in 06/2016 for continued chest pain and parasthesias. V/Q scan on 06/27/16 showed prominent ventilation perfusion mismatch in right c/w chronic PE.  Head CT negative. She was continued on Xarelto and told to follow up with [facility]. Patient doesn't want to have to travel to [facility] for care so [place] care here in [place].   Admitted earlier this month with SOB and chest pain. Diuresed with IV lasix and transitioned to lasix 40 mg daily + 12.5 mg spiro. Seen by HF team and set up for [place] as an outpatient. Discharge 336 pounds.   Admitted to [place] for suicidal ideation. She was then sent to [facility] for additional treatment. She has ongoing psychiatric follow up.   Today she returns for HF follow up. Last week she was instructed to take extra lasix for 4 days. Leg edema improved.  Overall feeling fair. Frustrated about her bills. Continues to get SOB with exertion. She has presyncope a few days a week. Denies PND/Orthopnea. Appetite ok. Using Mrs Deliah Boston.  No fever or chills. Weight at home   Taking all medications. Followed by Paramedicine. Requires assistance with transportation from [facility]. Followed by Palliative Care.  08/21/17 Echo 60-65%.RV ok. PA pressures could not be estimated. 04/2017: EF 55-60%  RV normal RVSP 50mmHG  RHC 08/26/2017 RA = 6 RV = 50/7 PA = 48/13 (30) PCW = 6 Fick cardiac output/index = 7.0/2.9 PVR = 3.1 WU Ao sat = 97% PA sat = 67%, 70% SVC sat = 70% Assessment: Mild PAH (on Adempas 1mg  tid) with high cardiac output. No evidence of intracardiac shunting.   10/2016 RHC RA = 8 RV = 64/13 PA = 65/19 (35) PCW = 9 Fick  cardiac output/index = 7.5/3.1 PVR = 3.5 WU Ao sat = 96% PA sat = 68%,70% SVC sat = 71% Assessment: 1. Mild residual PAH in the setting of CTEPH s/p pulmonary thrombolectomy 2. Normal cardiac output 3. Normal left-sided filling pressures  Past Medical History:  Diagnosis Date  . Arthritis   . CHF exacerbation ([place]) 08/21/2017  . Depression   . Diverticulitis   . Hyperlipidemia   . Hypertension   . Obesity   . Pneumonia   . Pulmonary embolism [facility])     Past  Surgical History:  Procedure Laterality Date  . ABDOMINAL HYSTERECTOMY    . BREAST REDUCTION SURGERY    . CHOLECYSTECTOMY    . EMBOLECTOMY N/A    pulmonary embolectomy  . REDUCTION MAMMAPLASTY Bilateral   . RIGHT HEART CATH N/A 10/28/2016   Procedure: Right Heart Cath;  Surgeon: Jolaine Artist, MD;  Location: [facility];  Service: Cardiovascular;  Laterality: N/A;  . RIGHT HEART CATH N/A 08/26/2017   Procedure: RIGHT HEART CATH;  Surgeon: Jolaine Artist, MD;  Location: [facility];  Service: Cardiovascular;  Laterality: N/A;  . ULTRASOUND GUIDANCE FOR VASCULAR ACCESS  08/26/2017   Procedure: Ultrasound Guidance For Vascular Access;  Surgeon: Jolaine Artist, MD;  Location: [facility];  Service: Cardiovascular;;    Current Medications:  Current Outpatient Medications on File Prior to Encounter  Medication Sig Dispense Refill  . albuterol (PROVENTIL HFA;VENTOLIN HFA) 108 (90 Base) MCG/ACT inhaler Inhale 1-2 puffs into the lungs every 6 (six) hours as needed for wheezing. 3 Inhaler 6  . ALPRAZolam (XANAX) 1 MG tablet Take 1 mg by mouth as needed for anxiety.    Marland Kitchen atorvastatin (LIPITOR) 20 MG tablet Take 1 tablet (20 mg total) by mouth daily at 6 PM. 90 tablet 3  . benzonatate (TESSALON) 100 MG capsule Take 1 capsule (100 mg total) by mouth every 8 (eight) hours. 21 capsule 0  . busPIRone (BUSPAR) 5 MG tablet Take 1 tablet (5 mg total) by mouth 2 (two) times daily. 180 tablet 3  . cyclobenzaprine (FLEXERIL) 10 MG tablet Take 10 mg by mouth at bedtime.    . cyclobenzaprine (FLEXERIL) 5 MG tablet Take 1 tablet (5 mg total) by mouth 2 (two) times daily as needed. for muscle spams 40 tablet 1  . docusate sodium (COLACE) 100 MG capsule Take 1 capsule (100 mg total) by mouth every 12 (twelve) hours. 30 capsule 0  . ferrous sulfate (FERROUSUL) 325 (65 FE) MG tablet Take 1 tablet (325 mg total) by mouth daily with breakfast. 90 tablet 3  . fluticasone (FLONASE) 50  MCG/ACT nasal spray Place 2 sprays into both nostrils daily. (Patient taking differently: Place 2 sprays into both nostrils daily as needed for allergies. ) 16 g 6  . furosemide (LASIX) 40 MG tablet Take 1 tablet (40 mg total) by mouth daily. Take extra 40 mg tablet once in the afternoon AS NEEDED for weight gain 3 lbs or more. 180 tablet 3  . gabapentin (NEURONTIN) 300 MG capsule Take 2 capsules (600 mg total) by mouth 3 (three) times daily. 540 capsule 3  . loratadine (CLARITIN) 10 MG tablet Take 1 tablet (10 mg total) by mouth daily. 90 tablet 2  . meclizine (ANTIVERT) 25 MG tablet TAKE ONE TABLET BY MOUTH THREE TIMES DAILY AS NEEDED FOR  DIZZINESS 60 tablet 0  . omeprazole (PRILOSEC) 40 MG capsule Take 1 capsule (40 mg total) by mouth 2 (two)  times daily. 180 capsule 3  . Riociguat (ADEMPAS) 1 MG TABS Take 1 mg by mouth 3 (three) times daily. At 0700, 1500 and 2300    . rivaroxaban (XARELTO) 20 MG TABS tablet Take 1 tablet (20 mg total) by mouth daily with supper. 90 tablet 3  . spironolactone (ALDACTONE) 25 MG tablet Take 0.5 tablets (12.5 mg total) by mouth daily. 45 tablet 3  . vortioxetine HBr (TRINTELLIX) 10 MG TABS tablet Take 10 mg by mouth daily.    Marland Kitchen zolpidem (AMBIEN) 10 MG tablet Take 10 mg by mouth at bedtime as needed for sleep.     No current facility-administered medications on file prior to encounter.     Allergies:   [name].   Social History   Socioeconomic History  . Marital status: Married    Spouse name: Not on file  . Number of children: Not on file  . Years of education: Not on file  . Highest education level: Not on file  Occupational History  . Not on file  Social Needs  . Financial resource strain: Not on file  . Food insecurity:    Worry: Not on file    Inability: Not on file  . Transportation needs:    Medical: Not on file    Non-medical: Not on file  Tobacco Use  . Smoking status: Never Smoker  . Smokeless tobacco: Never Used   Substance and Sexual Activity  . Alcohol use: No  . Drug use: No  . Sexual activity: Yes    Partners: Male    Birth control/protection: Surgical  Lifestyle  . Physical activity:    Days per week: Not on file    Minutes per session: Not on file  . Stress: Not on file  Relationships  . Social connections:    Talks on phone: Not on file    Gets together: Not on file    Attends religious service: Not on file    Active member of club or organization: Not on file    Attends meetings of clubs or organizations: Not on file    Relationship status: Not on file  Other Topics Concern  . Not on file  Social History Narrative  . Not on file     Family History:  The patient's family history includes Anxiety disorder in her cousin, sister, and sister; Bipolar disorder in her sister and sister; Dementia in her father and mother; Drug abuse in her sister and sister; Lung cancer in her maternal grandmother; Sexual abuse in her maternal aunt.      ROS:   Please see the history of present illness.    Review of Systems  Constitution: Positive for weight gain.  HENT: Positive for hoarse voice.   Cardiovascular: Positive for dyspnea on exertion, leg swelling and near-syncope.  Respiratory: Positive for shortness of breath.   Musculoskeletal: Positive for back pain, joint pain and joint swelling.       LUE pain in bicep  Neurological: Positive for weakness.  Psychiatric/Behavioral: Positive for depression.   All other systems reviewed and are negative.   PHYSICAL EXAM:   VS:  BP 96/64   Pulse 86   Wt (!) 346 lb 6.4 oz (157.1 kg)   SpO2 98%   BMI 61.36 kg/m     General:  Appears chronically ill.  No resp difficulty. Ambulated in the clinic with a cane. Walked slowly.  HEENT: normal Neck: supple. JVD hard to assess due to body  habitus. Carotids 2+ bilat; no bruits. No lymphadenopathy or thryomegaly appreciated. Cor: PMI nondisplaced. Regular rate & rhythm. No rubs, gallops or  murmurs. Lungs: clear Abdomen: obese, soft, nontender, nondistended. No hepatosplenomegaly. No bruits or masses. Good bowel sounds. Extremities: no cyanosis, clubbing, rash, edema Neuro: alert & orientedx3, cranial nerves grossly intact. moves all 4 extremities w/o difficulty. Affect pleasant    Wt Readings from Last 3 Encounters:  12/29/17 (!) 346 lb 6.4 oz (157.1 kg)  12/26/17 (!) 348 lb (157.9 kg)  12/25/17 (!) 344 lb (156 kg)      Studies/Labs Reviewed:    Recent Labs: 12/05/2017: ALT 15; Hemoglobin 12.7; Platelets 329 12/26/2017: B Natriuretic Peptide 14.0; BUN 16; Creatinine, Ser 1.08; Potassium 5.4; Sodium 142   Lipid Panel    Component Value Date/Time   CHOL 166 06/27/2016 0346   TRIG 125 06/27/2016 0346   HDL 33 (L) 06/27/2016 0346   CHOLHDL 5.0 06/27/2016 0346   VLDL 25 06/27/2016 0346   LDLCALC 108 (H) 06/27/2016 0346    Additional studies/ records that were reviewed today include:   ECHO 8/18 EF 55-60%  RV normal RVSP 25mmHG   2D ECHO: 12/15/2015 LV EF: 55% -   60% Study Conclusions - Left ventricle: The cavity size was normal. Wall thickness was normal. Systolic function was normal. The estimated ejection  fraction was in the range of 55% to 60%. - Pulmonary arteries: PA peak pressure: 70 mm Hg (S). - Pericardium, extracardiac: A trivial pericardial effusion was   identified.   2D ECHO 04/05/16 INTERPRETATION:NORMAL LEFT VENTRICULAR FUNCTION WITH MILD LVHSEVERE RV SYSTOLIC DYSFUNCTION (See above) VALVULAR REGURGITATION: TRIVIAL PR, MILD TR NO VALVULAR STENOSIS TRIVIAL PERICARDIAL EFFUSION Compared with prior Echo study on 03/14/2016: RVSP increased from 50 to 69   CATH right heart and coronary angiography: 03/25/2016 [place] Component Name Value Ref Range  Cardiac Index (l/min/m2) 2.3 L/min/m2  Right Atrium Mean Pressure (mmHg) 11 mmHg   Right Ventricle Systolic Pressure (mmHg) 68 mmHg   Pulmonary Artery Mean Pressure  (mmHg) 40 mmHg   Pulmonary Wedge Pressure (mmHg) 14 mmHg   Pulmonary Vascular Resistance (Wood units) 4.7    CT angio 05/15/16 IMPRESSION: Persistent but partially recanalized pulmonary embolus within the right lower lobe pulmonary artery. No new pulmonary embolism is identified. Minimal bibasilar atelectatic changes stable from the prior exam.   V/Q scan 06/27/16 IMPRESSION: Prominent ventilation perfusion mismatch noted on the right. Finding suggests high probability right-sided pulmonary embolus in this patient with known right-sided pulmonary embolus.   ASSESSMENT & PLAN:   1. Dizziness- Continue meclizine.  -  2. Chronic chest pain:  - Has chronic PE s/p thrombectomy, LHC performed at [facility] 7/17 - no CAD. - resolved.   3. 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  - Mild volume overload. Continue lasix 40 mg daily with an extra 40 mg daily as needed for 3 pound weight gain.   4. Hypotension Stable.    5. Fibromyalgia/Rheumatoid Arthitis:  - Follows with Rheumatology.   6. Morbid obesity with OHS:  Body mass index is 61.36 kg/m. Continues to pursue weight loss surgery.   7. OSA - Continue nightly CPAP.    8. Depression/anxiety Receiving outpatient therapy.   - Per  PCP  9. Hyper kalemia  Refused lab work. Instructed to hold Mrs Deliah Boston.    Follow up 3-4 months with Dr Haroldine Laws. She refuses lab work today. Instructed to stop Mrs Deliah Boston due to recent hyperkalemia.          Chiquetta Langner NP-C  11:14 AM

## 2017-12-29 NOTE — Telephone Encounter (Signed)
Adempas dose changed during last hospitalization to 1 mg TID. At last home RN visit in March, dose was uptitrated to 2 mg TID. I have called Ms. Bogacki and left VM for her to call me back to verify what dose she is actually taking.   Ruta Hinds. Velva Harman, PharmD, BCPS, CPP Clinical Pharmacist Phone: [phone] 12/29/2017 9:43 AM

## 2017-12-29 NOTE — Patient Instructions (Addendum)
Routine lab work today. Will notify you of abnormal results, otherwise no news is good news!  Continue Lasix 40 mg once daily. Take EXTRA 40 mg once daily as needed for swelling in legs.  Follow up 3 months with Dr. Haroldine Laws.  ______________________________________________________________ [name] [name] Code: 1400  Take all medication as prescribed the day of your appointment. Bring all medications with you to your appointment.  Do the following things EVERYDAY: 1) Weigh yourself in the morning before breakfast. Write it down and keep it in a log. 2) Take your medicines as prescribed 3) Eat low salt foods-Limit salt (sodium) to 2000 mg per day.  4) Stay as active as you can everyday 5) Limit all fluids for the day to less than 2 liters

## 2017-12-31 ENCOUNTER — Telehealth (HOSPITAL_COMMUNITY): Payer: Self-pay

## 2017-12-31 NOTE — Telephone Encounter (Signed)
I called [name] [name] to schedule an appointment. She did not answer so I left a voicemail requesting she call me back to set something up.

## 2018-01-05 ENCOUNTER — Ambulatory Visit: Payer: Self-pay | Admitting: Internal Medicine

## 2018-01-07 ENCOUNTER — Encounter: Payer: Self-pay | Admitting: Internal Medicine

## 2018-01-07 NOTE — Progress Notes (Signed)
PC placed to RN Manuela Schwartz at [facility] Pre-surgical Department. I had received a fax from her wanting an order for tPA instillation to pt's port on f/u visit if they are unable to flush or get blood return back from it. On her next visit.  She reports that when pt was seen last mth for blood test, they were unable to get blood return.  Pt plans to come back this mth. She wanted an order for them to install a small amount of tPA that sits in the port (does not go systemically) for about 2 hrs after which they will try to flush again.  I gave the okay to do so.

## 2018-01-08 ENCOUNTER — Telehealth (HOSPITAL_COMMUNITY): Payer: Self-pay

## 2018-01-08 ENCOUNTER — Encounter: Payer: Self-pay | Admitting: Internal Medicine

## 2018-01-08 ENCOUNTER — Ambulatory Visit: Payer: Medicare HMO | Attending: Internal Medicine | Admitting: Internal Medicine

## 2018-01-08 VITALS — BP 115/72 | HR 113 | Temp 98.8°F | Resp 16 | Wt 347.6 lb

## 2018-01-08 DIAGNOSIS — F32A Depression, unspecified: Secondary | ICD-10-CM

## 2018-01-08 DIAGNOSIS — M069 Rheumatoid arthritis, unspecified: Secondary | ICD-10-CM | POA: Insufficient documentation

## 2018-01-08 DIAGNOSIS — F419 Anxiety disorder, unspecified: Secondary | ICD-10-CM | POA: Diagnosis not present

## 2018-01-08 DIAGNOSIS — Z8673 Personal history of transient ischemic attack (TIA), and cerebral infarction without residual deficits: Secondary | ICD-10-CM | POA: Insufficient documentation

## 2018-01-08 DIAGNOSIS — I2724 Chronic thromboembolic pulmonary hypertension: Secondary | ICD-10-CM | POA: Insufficient documentation

## 2018-01-08 DIAGNOSIS — R079 Chest pain, unspecified: Secondary | ICD-10-CM | POA: Diagnosis not present

## 2018-01-08 DIAGNOSIS — F329 Major depressive disorder, single episode, unspecified: Secondary | ICD-10-CM

## 2018-01-08 DIAGNOSIS — F4321 Adjustment disorder with depressed mood: Secondary | ICD-10-CM | POA: Insufficient documentation

## 2018-01-08 DIAGNOSIS — G894 Chronic pain syndrome: Secondary | ICD-10-CM | POA: Insufficient documentation

## 2018-01-08 DIAGNOSIS — M797 Fibromyalgia: Secondary | ICD-10-CM | POA: Diagnosis not present

## 2018-01-08 DIAGNOSIS — L309 Dermatitis, unspecified: Secondary | ICD-10-CM | POA: Insufficient documentation

## 2018-01-08 DIAGNOSIS — Z9049 Acquired absence of other specified parts of digestive tract: Secondary | ICD-10-CM | POA: Diagnosis not present

## 2018-01-08 DIAGNOSIS — G4733 Obstructive sleep apnea (adult) (pediatric): Secondary | ICD-10-CM | POA: Diagnosis not present

## 2018-01-08 DIAGNOSIS — I110 Hypertensive heart disease with heart failure: Secondary | ICD-10-CM | POA: Diagnosis not present

## 2018-01-08 DIAGNOSIS — Z7901 Long term (current) use of anticoagulants: Secondary | ICD-10-CM | POA: Diagnosis not present

## 2018-01-08 DIAGNOSIS — F332 Major depressive disorder, recurrent severe without psychotic features: Secondary | ICD-10-CM | POA: Diagnosis not present

## 2018-01-08 DIAGNOSIS — M5416 Radiculopathy, lumbar region: Secondary | ICD-10-CM | POA: Diagnosis not present

## 2018-01-08 DIAGNOSIS — E875 Hyperkalemia: Secondary | ICD-10-CM | POA: Insufficient documentation

## 2018-01-08 DIAGNOSIS — Z6841 Body Mass Index (BMI) 40.0 and over, adult: Secondary | ICD-10-CM | POA: Insufficient documentation

## 2018-01-08 DIAGNOSIS — Z79899 Other long term (current) drug therapy: Secondary | ICD-10-CM | POA: Diagnosis not present

## 2018-01-08 DIAGNOSIS — I5032 Chronic diastolic (congestive) heart failure: Secondary | ICD-10-CM | POA: Diagnosis not present

## 2018-01-08 DIAGNOSIS — D6489 Other specified anemias: Secondary | ICD-10-CM | POA: Insufficient documentation

## 2018-01-08 MED ORDER — ACETAMINOPHEN-CODEINE #4 300-60 MG PO TABS: 1.0000 | ORAL_TABLET | Freq: Three times a day (TID) | ORAL | 0 refills | Status: DC | PRN

## 2018-01-08 MED ORDER — CLOTRIMAZOLE-BETAMETHASONE 1-0.05 % EX CREA: 1.0000 "application " | TOPICAL_CREAM | Freq: Two times a day (BID) | CUTANEOUS | 0 refills | Status: DC

## 2018-01-08 NOTE — Progress Notes (Signed)
Patient ID: [name] [name], female    DOB: 27-Sep-1960  MRN: [id number]  CC: chronic ds management  Subjective: [name] [name] is a 57 y.o. female who presents for chronic ds management.  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  Seen in HF clinic 12/29/2017.  Note reviewed.  K+ found elevated at 5.4.  Told to d/c K+ supplement Furosemide inc to BID as needed for fld retention in legs LT>RT.Marland Kitchen  She has been having to take it BID.  Also taking the spironolactone 12.5 mg daily.  She is requesting to have potassium rechecked.  2.  Dep/Anx:  Seeing therapist Norlene Duel, LCASA with [facility]) once a wk and has seen the psychiatrist [facility] Dr. Leanord Hawking, MD -could not afford the Trintellix.  We will see psychiatrist next week and hopefully this can be changed to something that is covered by her insurance.  On Xanax PRN and another med but does not recall the name.  She had stopped BuSpar prior to her admission to psychiatry last month -no visual or auditory hallicinations  3.  Lumbar radiculopathy: She decided not to see the neurosurgeon anymore.  She has decided to deal with the chronic pain by continuing her current medications and saying her therapist.  LT leg burns and hurts.  He is on gabapentin and Tylenol No. 4's.  She is requesting refill on Tylenol No. 4's.  These help to take the edge off.  She tries to be active moving around her house as much as her breathing would allow.  When she gets too short of breath she sits down, take deep breaths and apply her oxygen  4.  Rash on RT side of breast x 2 mths.  No initiating factors.  It is increased in size and it burns  and itches   5.  Wants f/u to follow-up with her pulmonologist Dr. Vaughan Browner.  She requests that we arrange this for her Patient Active Problem List   Diagnosis Date Noted  . Major depressive disorder, recurrent severe without psychotic features ([place]) 12/06/2017  . CHF exacerbation ([place]) 08/21/2017  . (HFpEF) heart failure with preserved ejection fraction ([place]) 08/20/2017  . Lumbar radiculopathy 08/08/2017  . Morbid obesity with BMI of 50.0-59.9, adult ([place]) 08/08/2017  . Hoarseness 08/08/2017  . Chronic cough 04/10/2017  . Rheumatoid arthritis involving multiple sites ([place]) 04/10/2017  . Anxiety and depression 04/10/2017  . Adjustment disorder with depressed mood 12/11/2016  . Morbid obesity ([place]) 08/14/2016  . Stroke ([place]) 06/27/2016  . Right sided weakness 06/27/2016  . Essential hypertension 06/27/2016  . Atypical chest pain 06/27/2016  . Difficult airway 03/29/2016  . CTEPH (chronic thromboembolic pulmonary hypertension) ([place]) 03/14/2016  . OSA on CPAP 02/20/2016  . Chronic pain syndrome 01/31/2016  . Pulmonary embolus ([place]) 11/15/2015  . Vertigo 11/15/2015  . Depression 11/15/2015     Current Outpatient Medications on File Prior to Visit  Medication Sig Dispense Refill  . albuterol (PROVENTIL HFA;VENTOLIN HFA) 108 (90 Base) MCG/ACT inhaler Inhale 1-2 puffs into the lungs every 6 (six) hours as needed for wheezing. 3 Inhaler 6  . ALPRAZolam (XANAX) 1 MG tablet Take 1 mg by mouth as needed for anxiety.    Marland Kitchen  atorvastatin (LIPITOR) 20 MG tablet Take 1 tablet (20 mg total) by mouth daily at 6 PM. 90 tablet 3  . benzonatate (TESSALON) 100 MG capsule Take 1 capsule (100 mg total) by mouth every 8 (eight) hours. 21 capsule 0  . cyclobenzaprine (FLEXERIL) 10 MG tablet Take 10 mg by mouth at bedtime.    . cyclobenzaprine (FLEXERIL) 5 MG tablet Take 1 tablet (5 mg total) by mouth 2 (two) times daily as needed. for muscle spams 40 tablet 1  . docusate sodium (COLACE) 100 MG capsule Take 1  capsule (100 mg total) by mouth every 12 (twelve) hours. 30 capsule 0  . ferrous sulfate (FERROUSUL) 325 (65 FE) MG tablet Take 1 tablet (325 mg total) by mouth daily with breakfast. 90 tablet 3  . fluticasone (FLONASE) 50 MCG/ACT nasal spray Place 2 sprays into both nostrils daily. (Patient taking differently: Place 2 sprays into both nostrils daily as needed for allergies. ) 16 g 6  . furosemide (LASIX) 40 MG tablet Take 1 tablet (40 mg total) by mouth daily. Take extra 40 mg tablet once in the afternoon AS NEEDED for weight gain 3 lbs or more. 180 tablet 3  . gabapentin (NEURONTIN) 300 MG capsule Take 2 capsules (600 mg total) by mouth 3 (three) times daily. 540 capsule 3  . loratadine (CLARITIN) 10 MG tablet Take 1 tablet (10 mg total) by mouth daily. 90 tablet 2  . meclizine (ANTIVERT) 25 MG tablet TAKE ONE TABLET BY MOUTH THREE TIMES DAILY AS NEEDED FOR  DIZZINESS 60 tablet 0  . omeprazole (PRILOSEC) 40 MG capsule Take 1 capsule (40 mg total) by mouth 2 (two) times daily. 180 capsule 3  . Riociguat (ADEMPAS) 1 MG TABS Take 1 mg by mouth 3 (three) times daily. At 0700, 1500 and 2300    . rivaroxaban (XARELTO) 20 MG TABS tablet Take 1 tablet (20 mg total) by mouth daily with supper. 90 tablet 3  . spironolactone (ALDACTONE) 25 MG tablet Take 0.5 tablets (12.5 mg total) by mouth daily. 45 tablet 3  . zolpidem (AMBIEN) 10 MG tablet Take 10 mg by mouth at bedtime as needed for sleep.     No current facility-administered medications on file prior to visit.     No Known Allergies  Social History   Socioeconomic History  . Marital status: Married    Spouse name: Not on file  . Number of children: Not on file  . Years of education: Not on file  . Highest education level: Not on file  Occupational History  . Not on file  Social Needs  . Financial resource strain: Not on file  . Food insecurity:    Worry: Not on file    Inability: Not on file  . Transportation needs:    Medical: Not on  file    Non-medical: Not on file  Tobacco Use  . Smoking status: Never Smoker  . Smokeless tobacco: Never Used  Substance and Sexual Activity  . Alcohol use: No  . Drug use: No  . Sexual activity: Yes    Partners: Male    Birth control/protection: Surgical  Lifestyle  . Physical activity:    Days per week: Not on file    Minutes per session: Not on file  . Stress: Not on file  Relationships  . Social connections:    Talks on phone: Not on file    Gets together: Not on file    Attends religious service: Not on file  Active member of club or organization: Not on file    Attends meetings of clubs or organizations: Not on file    Relationship status: Not on file  . Intimate partner violence:    Fear of current or ex partner: Not on file    Emotionally abused: Not on file    Physically abused: Not on file    Forced sexual activity: Not on file  Other Topics Concern  . Not on file  Social History Narrative  . Not on file    Family History  Problem Relation Age of Onset  . Lung cancer Maternal Grandmother   . Dementia Mother   . Dementia Father   . Anxiety disorder Sister   . Bipolar disorder Sister   . Drug abuse Sister   . Sexual abuse Maternal Aunt   . Anxiety disorder Cousin   . Anxiety disorder Sister   . Bipolar disorder Sister   . Drug abuse Sister   . Breast cancer Neg Hx     Past Surgical History:  Procedure Laterality Date  . ABDOMINAL HYSTERECTOMY    . BREAST REDUCTION SURGERY    . CHOLECYSTECTOMY    . EMBOLECTOMY N/A    pulmonary embolectomy  . REDUCTION MAMMAPLASTY Bilateral   . RIGHT HEART CATH N/A 10/28/2016   Procedure: Right Heart Cath;  Surgeon: Jolaine Artist, MD;  Location: [place] [place] [facility];  Service: Cardiovascular;  Laterality: N/A;  . RIGHT HEART CATH N/A 08/26/2017   Procedure: RIGHT HEART CATH;  Surgeon: Jolaine Artist, MD;  Location: [place] [facility];  Service: Cardiovascular;  Laterality: N/A;  . ULTRASOUND GUIDANCE FOR  VASCULAR ACCESS  08/26/2017   Procedure: Ultrasound Guidance For Vascular Access;  Surgeon: Jolaine Artist, MD;  Location: [facility];  Service: Cardiovascular;;    ROS: Review of Systems Negative except as stated above PHYSICAL EXAM: BP 115/72   Pulse (!) 113   Temp 98.8 F (37.1 C) (Oral)   Resp 16   Wt (!) 347 lb 9.6 oz (157.7 kg)   SpO2 96%   BMI 61.57 kg/m   Wt Readings from Last 3 Encounters:  01/08/18 (!) 347 lb 9.6 oz (157.7 kg)  12/29/17 (!) 346 lb 6.4 oz (157.1 kg)  12/26/17 (!) 348 lb (157.9 kg)    Physical Exam  General appearance - alert, well appearing, morbidly obese female and in no distress Mental status - normal mood, behavior, speech, dress, motor activity, and thought processes Neck - supple, no significant adenopathy Chest - clear to auscultation, no wheezes, rales or rhonchi, symmetric air entry Heart - normal rate, regular rhythm, normal S1, S2, no murmurs, rubs, clicks or gallops.  Mild point tenderness on palpation over the anterior chest wall Musculoskeletal -ambulating independently today Extremities -trace edema in lower extremities left greater than right Skin: 2 cm slightly raised, and slightly erythematous lesion with clearly defined oval border below the right axilla nontender to touch.    Chemistry      Component Value Date/Time   NA 142 12/26/2017 1100   K 5.4 (H) 12/26/2017 1100   CL 109 12/26/2017 1100   CO2 25 12/26/2017 1100   BUN 16 12/26/2017 1100   CREATININE 1.08 (H) 12/26/2017 1100   CREATININE 1.11 (H) 11/20/2015 1718      Component Value Date/Time   CALCIUM 9.2 12/26/2017 1100   ALKPHOS 83 12/05/2017 2024   AST 16 12/05/2017 2024   ALT 15 12/05/2017 2024   BILITOT  0.5 12/05/2017 2024     Lab Results  Component Value Date   WBC 7.4 12/05/2017   HGB 12.7 12/05/2017   HCT 40.1 12/05/2017   MCV 66.1 (L) 12/05/2017   PLT 329 12/05/2017    ASSESSMENT AND PLAN: 1. Anxiety and depression -Patient is  finally plugged in with mental health provider and seems to be doing well.  She will speak with her psychiatrist about the Trintellix  2. Dermatitis Questionable fungal.  We will try her with antifungal cream.  If it does not resolve will refer to dermatology - clotrimazole-betamethasone (LOTRISONE) cream; Apply 1 application topically 2 (two) times daily.  Dispense: 30 g; Refill: 0  3. Lumbar radiculopathy -Patient denies any constipation with the Tylenol #4.  She is able to move around and function some on the medication.  [place] controlled substance reviewed and is appropriate. -Updated controlled substance prescribing agreement today.  Patient read the document and signed - acetaminophen-codeine (TYLENOL #4) 300-60 MG tablet; Take 1 tablet by mouth every 8 (eight) hours as needed for moderate pain.  Dispense: 90 tablet; Refill: 0 - acetaminophen-codeine (TYLENOL #4) 300-60 MG tablet; Take 1 tablet by mouth every 8 (eight) hours as needed for moderate pain.  Dispense: 90 tablet; Refill: 0  4. CTEPH (chronic thromboembolic pulmonary hypertension) ([place]) - Ambulatory referral to Pulmonology  5. Hyperkalemia - Potassium; Future  Patient was given the opportunity to ask questions.  Patient verbalized understanding of the plan and was able to repeat key elements of the plan.   Orders Placed This Encounter  Procedures  . Potassium  . Ambulatory referral to Pulmonology     Requested Prescriptions   Signed Prescriptions Disp Refills  . clotrimazole-betamethasone (LOTRISONE) cream 30 g 0    Sig: Apply 1 application topically 2 (two) times daily.  Marland Kitchen acetaminophen-codeine (TYLENOL #4) 300-60 MG tablet 90 tablet 0    Sig: Take 1 tablet by mouth every 8 (eight) hours as needed for moderate pain.  Marland Kitchen acetaminophen-codeine (TYLENOL #4) 300-60 MG tablet 90 tablet 0    Sig: Take 1 tablet by mouth every 8 (eight) hours as needed for moderate pain.    Return in about 3 months (around  04/10/2018).  Karle Plumber, MD, FACP

## 2018-01-08 NOTE — Telephone Encounter (Signed)
I called [name] [name] to schedule an appointment. A woman answered and said that she was not home but would deliver a message. I advised her to have Humna call me when she returns.

## 2018-01-12 DIAGNOSIS — F332 Major depressive disorder, recurrent severe without psychotic features: Secondary | ICD-10-CM | POA: Diagnosis not present

## 2018-01-13 ENCOUNTER — Telehealth: Payer: Self-pay

## 2018-01-13 ENCOUNTER — Telehealth (HOSPITAL_COMMUNITY): Payer: Self-pay

## 2018-01-13 DIAGNOSIS — I272 Pulmonary hypertension, unspecified: Secondary | ICD-10-CM | POA: Diagnosis not present

## 2018-01-13 DIAGNOSIS — I2724 Chronic thromboembolic pulmonary hypertension: Secondary | ICD-10-CM | POA: Diagnosis not present

## 2018-01-13 DIAGNOSIS — R0602 Shortness of breath: Secondary | ICD-10-CM | POA: Diagnosis not present

## 2018-01-13 DIAGNOSIS — G4733 Obstructive sleep apnea (adult) (pediatric): Secondary | ICD-10-CM | POA: Diagnosis not present

## 2018-01-13 NOTE — Telephone Encounter (Signed)
Mr Veenstra called me today to advise that [name] [name] did not want to keep our appointment this morning because she was tired. I requested they call me when she was feeling better and she wanted to meet.

## 2018-01-14 ENCOUNTER — Other Ambulatory Visit: Payer: Medicare HMO | Admitting: Internal Medicine

## 2018-01-14 DIAGNOSIS — R0602 Shortness of breath: Secondary | ICD-10-CM

## 2018-01-14 DIAGNOSIS — F4321 Adjustment disorder with depressed mood: Secondary | ICD-10-CM

## 2018-01-14 DIAGNOSIS — Z515 Encounter for palliative care: Secondary | ICD-10-CM

## 2018-01-16 DIAGNOSIS — Z515 Encounter for palliative care: Secondary | ICD-10-CM | POA: Insufficient documentation

## 2018-01-16 DIAGNOSIS — R0602 Shortness of breath: Secondary | ICD-10-CM | POA: Insufficient documentation

## 2018-01-16 NOTE — Progress Notes (Signed)
PALLIATIVE CARE CONSULT VISIT   PATIENT NAME: [name] [name] DOB: [birth year] MRN: [id number]  PRIMARY CARE PROVIDER:   Ladell Pier, MD  REFERRING PROVIDER:      Ladell Pier, MD [place], [place] [address]  RESPONSIBLE PARTY:   Self   RECOMMENDATIONS and PLAN:  1. Adjustment disorder with depressed mood  F43.21:  Much improved since hospitalization.  Denies thoughts or plans of self harm.  Taking meds as prescribed.  Encouragement given.  Continue all therapeutic appts.  Monitor  2. Shortness of breath  R06.02:  Improved and requiring less supplemental oxygen.  Pace activities.  Continue nightly CPAP use and FU with cardiologist.  Sats 98% on rm air after exertion.  3.  Palliative care patient Z51.5:  Discussed goals of care which are to improve management of behavioral health, cardiac and respiratory diagnoses.  She is eager to have appt. With pulmonologist. Pt. Has more realistic views on health and her role in managing disorders.  She reports no difficulty in expressing when she has a behavioral health need and is thankful for the recent admission.  I spent 30 minutes providing this consultation at home,  from 11:30am to 12:00pm. More than 50% of the time in this consultation was spent coordinating communication with patient.   HISTORY OF PRESENT ILLNESS:  Follow-up with   [name] [name] who had an inpatient hospitalization due to [place] on 12/05/17.  She has returned to home and states that she feels much better mentally and physically.  She no longer has SI or hallucinations.  She reports that she has been keeping appts with her therapist and psychiatrist regularly.  She also has visited cardiology and has a pending appt with pulmonology.  Less shortness of breath.  Reports prn use of oxygen but uses CPAP.  She has no complaints today.    CODE STATUS: FULL CODE  PPS: 50% HOSPICE ELIGIBILITY/DIAGNOSIS: TBD  PAST MEDICAL HISTORY:  Past Medical  History:  Diagnosis Date  . Arthritis   . CHF exacerbation ([place]) 08/21/2017  . Depression   . Diverticulitis   . Hyperlipidemia   . Hypertension   . Obesity   . Pneumonia   . Pulmonary embolism ([place])     SOCIAL HX:  Social History   Tobacco Use  . Smoking status: Never Smoker  . Smokeless tobacco: Never Used  Substance Use Topics  . Alcohol use: No    ALLERGIES: No Known Allergies   PERTINENT MEDICATIONS:  Outpatient Encounter Medications as of 01/14/2018  Medication Sig  . acetaminophen-codeine (TYLENOL #4) 300-60 MG tablet Take 1 tablet by mouth every 8 (eight) hours as needed for moderate pain.  Derrill Memo ON 02/08/2018] acetaminophen-codeine (TYLENOL #4) 300-60 MG tablet Take 1 tablet by mouth every 8 (eight) hours as needed for moderate pain.  Marland Kitchen albuterol (PROVENTIL HFA;VENTOLIN HFA) 108 (90 Base) MCG/ACT inhaler Inhale 1-2 puffs into the lungs every 6 (six) hours as needed for wheezing.  Marland Kitchen ALPRAZolam (XANAX) 1 MG tablet Take 1 mg by mouth as needed for anxiety.  Marland Kitchen atorvastatin (LIPITOR) 20 MG tablet Take 1 tablet (20 mg total) by mouth daily at 6 PM.  . benzonatate (TESSALON) 100 MG capsule Take 1 capsule (100 mg total) by mouth every 8 (eight) hours.  . clotrimazole-betamethasone (LOTRISONE) cream Apply 1 application topically 2 (two) times daily.  . cyclobenzaprine (FLEXERIL) 10 MG tablet Take 10 mg by mouth at bedtime.  . cyclobenzaprine (FLEXERIL) 5 MG tablet  Take 1 tablet (5 mg total) by mouth 2 (two) times daily as needed. for muscle spams  . docusate sodium (COLACE) 100 MG capsule Take 1 capsule (100 mg total) by mouth every 12 (twelve) hours.  . ferrous sulfate (FERROUSUL) 325 (65 FE) MG tablet Take 1 tablet (325 mg total) by mouth daily with breakfast.  . fluticasone (FLONASE) 50 MCG/ACT nasal spray Place 2 sprays into both nostrils daily. (Patient taking differently: Place 2 sprays into both nostrils daily as needed for allergies. )  . furosemide (LASIX) 40 MG tablet  Take 1 tablet (40 mg total) by mouth daily. Take extra 40 mg tablet once in the afternoon AS NEEDED for weight gain 3 lbs or more.  . gabapentin (NEURONTIN) 300 MG capsule Take 2 capsules (600 mg total) by mouth 3 (three) times daily.  Marland Kitchen loratadine (CLARITIN) 10 MG tablet Take 1 tablet (10 mg total) by mouth daily.  . meclizine (ANTIVERT) 25 MG tablet TAKE ONE TABLET BY MOUTH THREE TIMES DAILY AS NEEDED FOR  DIZZINESS  . omeprazole (PRILOSEC) 40 MG capsule Take 1 capsule (40 mg total) by mouth 2 (two) times daily.  . Riociguat (ADEMPAS) 1 MG TABS Take 1 mg by mouth 3 (three) times daily. At 0700, 1500 and 2300  . rivaroxaban (XARELTO) 20 MG TABS tablet Take 1 tablet (20 mg total) by mouth daily with supper.  Marland Kitchen spironolactone (ALDACTONE) 25 MG tablet Take 0.5 tablets (12.5 mg total) by mouth daily.  Marland Kitchen zolpidem (AMBIEN) 10 MG tablet Take 10 mg by mouth at bedtime as needed for sleep.   No facility-administered encounter medications on file as of 01/14/2018.     PHYSICAL EXAM:   General: NAD,morbidly obese middle aged female walking about in home Cardiovascular: regular rate and rhythm Pulmonary: clear but diminished breath sounds in bases.  No increased work of breathing Abdomen: soft, nontender, + bowel sounds Extremities: 1+ edema without ecchymosis of BLE Skin: Exposed skin is intact Neurological: A&O x3.   Psych:  Cheerful mood with appropriate speech.  Good eye contact.  Calm and cooperative.  Gonzella Lex, NP-C

## 2018-01-16 NOTE — Telephone Encounter (Signed)
Phone call placed to patient to offer to schedule visit with Palliative Care. Visit scheduled

## 2018-01-19 ENCOUNTER — Ambulatory Visit: Payer: Self-pay | Admitting: Pulmonary Disease

## 2018-01-23 ENCOUNTER — Other Ambulatory Visit (HOSPITAL_COMMUNITY): Payer: Self-pay

## 2018-01-23 DIAGNOSIS — I5032 Chronic diastolic (congestive) heart failure: Secondary | ICD-10-CM

## 2018-01-23 NOTE — Progress Notes (Signed)
Paramedicine Encounter    Patient ID: [name] [name], female    DOB: 18-Jun-1961, 57 y.o.   MRN: [id number]   Patient Care Team: Ladell Pier, MD as PCP - General (Internal Medicine)  Patient Active Problem List   Diagnosis Date Noted  . Shortness of breath 01/16/2018  . Palliative care patient 01/16/2018  . Major depressive disorder, recurrent severe without psychotic features ([place]) 12/06/2017  . CHF exacerbation ([place]) 08/21/2017  . (HFpEF) heart failure with preserved ejection fraction ([place]) 08/20/2017  . Lumbar radiculopathy 08/08/2017  . Morbid obesity with BMI of 50.0-59.9, adult ([place]) 08/08/2017  . Hoarseness 08/08/2017  . Chronic cough 04/10/2017  . Rheumatoid arthritis involving multiple sites ([place]) 04/10/2017  . Anxiety and depression 04/10/2017  . Adjustment disorder with depressed mood 12/11/2016  . Morbid obesity ([place]) 08/14/2016  . Stroke ([place]) 06/27/2016  . Right sided weakness 06/27/2016  . Essential hypertension 06/27/2016  . Atypical chest pain 06/27/2016  . Difficult airway 03/29/2016  . CTEPH (chronic thromboembolic pulmonary hypertension) ([place]) 03/14/2016  . OSA on CPAP 02/20/2016  . Chronic pain syndrome 01/31/2016  . Pulmonary embolus ([place]) 11/15/2015  . Vertigo 11/15/2015  . Depression 11/15/2015    Current Outpatient Medications:  .  acetaminophen-codeine (TYLENOL #4) 300-60 MG tablet, Take 1 tablet by mouth every 8 (eight) hours as needed for moderate pain., Disp: 90 tablet, Rfl: 0 .  albuterol (PROVENTIL HFA;VENTOLIN HFA) 108 (90 Base) MCG/ACT inhaler, Inhale 1-2 puffs into the lungs every 6 (six) hours as needed for wheezing., Disp: 3 Inhaler, Rfl: 6 .  ALPRAZolam (XANAX) 1 MG tablet, Take 1 mg by mouth as needed for anxiety., Disp: , Rfl:  .  atorvastatin (LIPITOR) 20 MG tablet, Take 1 tablet (20 mg total) by mouth daily at 6 PM., Disp: 90 tablet, Rfl: 3 .  clotrimazole-betamethasone (LOTRISONE) cream, Apply 1 application topically 2  (two) times daily., Disp: 30 g, Rfl: 0 .  cyclobenzaprine (FLEXERIL) 10 MG tablet, Take 10 mg by mouth at bedtime., Disp: , Rfl:  .  cyclobenzaprine (FLEXERIL) 5 MG tablet, Take 1 tablet (5 mg total) by mouth 2 (two) times daily as needed. for muscle spams, Disp: 40 tablet, Rfl: 1 .  ferrous sulfate (FERROUSUL) 325 (65 FE) MG tablet, Take 1 tablet (325 mg total) by mouth daily with breakfast., Disp: 90 tablet, Rfl: 3 .  fluticasone (FLONASE) 50 MCG/ACT nasal spray, Place 2 sprays into both nostrils daily. (Patient taking differently: Place 2 sprays into both nostrils daily as needed for allergies. ), Disp: 16 g, Rfl: 6 .  furosemide (LASIX) 40 MG tablet, Take 1 tablet (40 mg total) by mouth daily. Take extra 40 mg tablet once in the afternoon AS NEEDED for weight gain 3 lbs or more., Disp: 180 tablet, Rfl: 3 .  gabapentin (NEURONTIN) 300 MG capsule, Take 2 capsules (600 mg total) by mouth 3 (three) times daily., Disp: 540 capsule, Rfl: 3 .  omeprazole (PRILOSEC) 40 MG capsule, Take 1 capsule (40 mg total) by mouth 2 (two) times daily., Disp: 180 capsule, Rfl: 3 .  Riociguat (ADEMPAS) 1 MG TABS, Take 1 mg by mouth 3 (three) times daily. At 0700, 1500 and 2300, Disp: , Rfl:  .  rivaroxaban (XARELTO) 20 MG TABS tablet, Take 1 tablet (20 mg total) by mouth daily with supper., Disp: 90 tablet, Rfl: 3 .  spironolactone (ALDACTONE) 25 MG tablet, Take 0.5 tablets (12.5 mg total) by mouth daily., Disp: 45 tablet, Rfl: 3 .  [START ON  02/08/2018] acetaminophen-codeine (TYLENOL #4) 300-60 MG tablet, Take 1 tablet by mouth every 8 (eight) hours as needed for moderate pain., Disp: 90 tablet, Rfl: 0 .  benzonatate (TESSALON) 100 MG capsule, Take 1 capsule (100 mg total) by mouth every 8 (eight) hours. (Patient not taking: Reported on 01/23/2018), Disp: 21 capsule, Rfl: 0 .  docusate sodium (COLACE) 100 MG capsule, Take 1 capsule (100 mg total) by mouth every 12 (twelve) hours. (Patient not taking: Reported on 01/23/2018),  Disp: 30 capsule, Rfl: 0 .  loratadine (CLARITIN) 10 MG tablet, Take 1 tablet (10 mg total) by mouth daily. (Patient not taking: Reported on 01/23/2018), Disp: 90 tablet, Rfl: 2 .  meclizine (ANTIVERT) 25 MG tablet, TAKE ONE TABLET BY MOUTH THREE TIMES DAILY AS NEEDED FOR  DIZZINESS (Patient not taking: Reported on 01/23/2018), Disp: 60 tablet, Rfl: 0 .  zolpidem (AMBIEN) 10 MG tablet, Take 10 mg by mouth at bedtime as needed for sleep., Disp: , Rfl:  No Known Allergies    Social History   Socioeconomic History  . Marital status: Married    Spouse name: Not on file  . Number of children: Not on file  . Years of education: Not on file  . Highest education level: Not on file  Occupational History  . Not on file  Social Needs  . Financial resource strain: Not on file  . Food insecurity:    Worry: Not on file    Inability: Not on file  . Transportation needs:    Medical: Not on file    Non-medical: Not on file  Tobacco Use  . Smoking status: Never Smoker  . Smokeless tobacco: Never Used  Substance and Sexual Activity  . Alcohol use: No  . Drug use: No  . Sexual activity: Yes    Partners: Male    Birth control/protection: Surgical  Lifestyle  . Physical activity:    Days per week: Not on file    Minutes per session: Not on file  . Stress: Not on file  Relationships  . Social connections:    Talks on phone: Not on file    Gets together: Not on file    Attends religious service: Not on file    Active member of club or organization: Not on file    Attends meetings of clubs or organizations: Not on file    Relationship status: Not on file  . Intimate partner violence:    Fear of current or ex partner: Not on file    Emotionally abused: Not on file    Physically abused: Not on file    Forced sexual activity: Not on file  Other Topics Concern  . Not on file  Social History Narrative  . Not on file    Physical Exam      Future Appointments  Date Time Provider  [place]  01/26/2018  9:00 AM [facility] ROOM [facility] None  01/26/2018 10:45 AM [facility] LAB [facility] None  03/30/2018  2:20 PM Bensimhon, Shaune Pascal, MD [facility] None    BP 124/76 (BP Location: Left Arm, Patient Position: Sitting, Cuff Size: Large)   Pulse 78   Resp 16   Wt (!) 340 lb (154.2 kg)   SpO2 96%   BMI 60.23 kg/m   Weight yesterday- Unable to obtain Last visit weight- 344 lb  [name] [name] was seen at home today and reported feeling well. She stated she still has intermittent dizziness but said it has not been any worse than before.  She reported being compliant with her medications though she seemed to have a surplus of her Xarelto. I asked about this and she said that she had combined an old bottle with her new one, though the bottle she had was filled in mid-March. She reported watching her fluid intake and said her weight is down from where is has been in the past. She did report a bit of a cold and diarrhea but had been continuing to take stool softeners. I advised her to stop those at least until the diarrhea stopped. She was agreeable. She also asked if the HF clinic wanted labs taken when she goes to have her port flushed next week so I contacted Jonni Sanger who advised that he would have the orders sent over for a BMET. Her medications were verified and her pillbox was refilled.  Time spent with patient: 28 minutes  [name], EMT 01/23/18  ACTION: Home visit completed Next visit planned for 1 week

## 2018-01-26 ENCOUNTER — Encounter (HOSPITAL_COMMUNITY)
Admission: RE | Admit: 2018-01-26 | Discharge: 2018-01-26 | Disposition: A | Discharge status: Home / Self Care | Payer: Medicare HMO | Source: Ambulatory Visit | Attending: Internal Medicine | Admitting: Internal Medicine

## 2018-01-26 ENCOUNTER — Encounter (HOSPITAL_COMMUNITY): Payer: Self-pay

## 2018-01-26 ENCOUNTER — Other Ambulatory Visit (HOSPITAL_COMMUNITY): Payer: Self-pay

## 2018-01-26 DIAGNOSIS — I2782 Chronic pulmonary embolism: Secondary | ICD-10-CM | POA: Diagnosis not present

## 2018-01-26 DIAGNOSIS — I5022 Chronic systolic (congestive) heart failure: Secondary | ICD-10-CM | POA: Diagnosis not present

## 2018-01-26 DIAGNOSIS — Z452 Encounter for adjustment and management of vascular access device: Secondary | ICD-10-CM | POA: Diagnosis not present

## 2018-01-26 MED ORDER — HEPARIN SOD (PORK) LOCK FLUSH 100 UNIT/ML IV SOLN
500.0000 [IU] | INTRAVENOUS | Status: DC
  Administered 2018-01-26: 500 [IU]

## 2018-01-26 MED ORDER — ALTEPLASE 2 MG IJ SOLR
2.0000 mg | Freq: Once | INTRAMUSCULAR | Status: AC
  Administered 2018-01-26: 2 mg
  Filled 2018-01-26: qty 2

## 2018-01-26 MED ORDER — SODIUM CHLORIDE 0.9% FLUSH
10.0000 mL | INTRAVENOUS | Status: DC
  Administered 2018-01-26: 10 mL via INTRAVENOUS
  Filled 2018-01-26: qty 10

## 2018-01-26 NOTE — Progress Notes (Signed)
Progress note entered last week by this RN prior to patient coming back for outpatient monthly port flush did not save to Epic.   Patient expected to come to [facility] short stay next week for monthly port flush. Spoke with Dr. [name] on 5/1 (see prior progress notes) and received order for TPA instillation in port if no blood return again. Also, this RN spoke to [facility] (her MD office) on 5/2 and prior order for tb quantiferon blood work to be d/c. Per prior progress notes it was unable to be drawn last month (office was made aware). Therefore this lab will NOT be drawn per order.            Update -   Patient here for port flush. Port difficult to access. Attempted times 2 RN in short stay and one IV RN. Patient "tastes" saline when accessed the last time indicating likely in correct place. No blood return noted as expected. Order for TPA entered (obtained from MD on 5/1 in preparation for today if needed). Awaiting RN from IV team to come install TPA in port. Patient aware of above.

## 2018-01-26 NOTE — Progress Notes (Signed)
IV team nurse Butch Penny instilled TPA per order in port. After two hours she returned and was able to get blood return. Patient's port then flushed with NS flush then heparin flush per order by IV RN. Deaccessed by IV RN. Patient escorted to exit. [facility] transportation coming to pick her up. Patient has next port flush date. She verbalizes understanding to call 911/go to the ER for any emergencies or to call MD if problems/questions.

## 2018-01-29 DIAGNOSIS — F332 Major depressive disorder, recurrent severe without psychotic features: Secondary | ICD-10-CM | POA: Diagnosis not present

## 2018-01-30 ENCOUNTER — Other Ambulatory Visit (HOSPITAL_COMMUNITY): Payer: Self-pay

## 2018-01-30 NOTE — Progress Notes (Signed)
Paramedicine Encounter    Patient ID: [name] [name], female    DOB: 1961/05/06, 57 y.o.   MRN: [id number]   Patient Care Team: Ladell Pier, MD as PCP - General (Internal Medicine)  Patient Active Problem List   Diagnosis Date Noted  . Shortness of breath 01/16/2018  . Palliative care patient 01/16/2018  . Major depressive disorder, recurrent severe without psychotic features ([place]) 12/06/2017  . CHF exacerbation ([place]) 08/21/2017  . (HFpEF) heart failure with preserved ejection fraction ([place]) 08/20/2017  . Lumbar radiculopathy 08/08/2017  . Morbid obesity with BMI of 50.0-59.9, adult ([place]) 08/08/2017  . Hoarseness 08/08/2017  . Chronic cough 04/10/2017  . Rheumatoid arthritis involving multiple sites ([place]) 04/10/2017  . Anxiety and depression 04/10/2017  . Adjustment disorder with depressed mood 12/11/2016  . Morbid obesity ([place]) 08/14/2016  . Stroke ([place]) 06/27/2016  . Right sided weakness 06/27/2016  . Essential hypertension 06/27/2016  . Atypical chest pain 06/27/2016  . Difficult airway 03/29/2016  . CTEPH (chronic thromboembolic pulmonary hypertension) ([place]) 03/14/2016  . OSA on CPAP 02/20/2016  . Chronic pain syndrome 01/31/2016  . Pulmonary embolus ([place]) 11/15/2015  . Vertigo 11/15/2015  . Depression 11/15/2015    Current Outpatient Medications:  .  acetaminophen-codeine (TYLENOL #4) 300-60 MG tablet, Take 1 tablet by mouth every 8 (eight) hours as needed for moderate pain., Disp: 90 tablet, Rfl: 0 .  albuterol (PROVENTIL HFA;VENTOLIN HFA) 108 (90 Base) MCG/ACT inhaler, Inhale 1-2 puffs into the lungs every 6 (six) hours as needed for wheezing., Disp: 3 Inhaler, Rfl: 6 .  ALPRAZolam (XANAX) 1 MG tablet, Take 1 mg by mouth as needed for anxiety., Disp: , Rfl:  .  atorvastatin (LIPITOR) 20 MG tablet, Take 1 tablet (20 mg total) by mouth daily at 6 PM., Disp: 90 tablet, Rfl: 3 .  clotrimazole-betamethasone (LOTRISONE) cream, Apply 1 application topically 2  (two) times daily., Disp: 30 g, Rfl: 0 .  cyclobenzaprine (FLEXERIL) 5 MG tablet, Take 1 tablet (5 mg total) by mouth 2 (two) times daily as needed. for muscle spams, Disp: 40 tablet, Rfl: 1 .  docusate sodium (COLACE) 100 MG capsule, Take 1 capsule (100 mg total) by mouth every 12 (twelve) hours., Disp: 30 capsule, Rfl: 0 .  ferrous sulfate (FERROUSUL) 325 (65 FE) MG tablet, Take 1 tablet (325 mg total) by mouth daily with breakfast., Disp: 90 tablet, Rfl: 3 .  fluticasone (FLONASE) 50 MCG/ACT nasal spray, Place 2 sprays into both nostrils daily. (Patient taking differently: Place 2 sprays into both nostrils daily as needed for allergies. ), Disp: 16 g, Rfl: 6 .  furosemide (LASIX) 40 MG tablet, Take 1 tablet (40 mg total) by mouth daily. Take extra 40 mg tablet once in the afternoon AS NEEDED for weight gain 3 lbs or more., Disp: 180 tablet, Rfl: 3 .  gabapentin (NEURONTIN) 300 MG capsule, Take 2 capsules (600 mg total) by mouth 3 (three) times daily., Disp: 540 capsule, Rfl: 3 .  meclizine (ANTIVERT) 25 MG tablet, TAKE ONE TABLET BY MOUTH THREE TIMES DAILY AS NEEDED FOR  DIZZINESS, Disp: 60 tablet, Rfl: 0 .  omeprazole (PRILOSEC) 40 MG capsule, Take 1 capsule (40 mg total) by mouth 2 (two) times daily., Disp: 180 capsule, Rfl: 3 .  Riociguat (ADEMPAS) 1 MG TABS, Take 1 mg by mouth 3 (three) times daily. At 0700, 1500 and 2300, Disp: , Rfl:  .  rivaroxaban (XARELTO) 20 MG TABS tablet, Take 1 tablet (20 mg total) by mouth daily  with supper., Disp: 90 tablet, Rfl: 3 .  spironolactone (ALDACTONE) 25 MG tablet, Take 0.5 tablets (12.5 mg total) by mouth daily., Disp: 45 tablet, Rfl: 3 .  zolpidem (AMBIEN) 10 MG tablet, Take 10 mg by mouth at bedtime as needed for sleep., Disp: , Rfl:  .  [START ON 02/08/2018] acetaminophen-codeine (TYLENOL #4) 300-60 MG tablet, Take 1 tablet by mouth every 8 (eight) hours as needed for moderate pain. (Patient not taking: Reported on 01/30/2018), Disp: 90 tablet, Rfl: 0 .   benzonatate (TESSALON) 100 MG capsule, Take 1 capsule (100 mg total) by mouth every 8 (eight) hours. (Patient not taking: Reported on 01/23/2018), Disp: 21 capsule, Rfl: 0 .  cyclobenzaprine (FLEXERIL) 10 MG tablet, Take 10 mg by mouth at bedtime., Disp: , Rfl:  .  loratadine (CLARITIN) 10 MG tablet, Take 1 tablet (10 mg total) by mouth daily. (Patient not taking: Reported on 01/23/2018), Disp: 90 tablet, Rfl: 2 No Known Allergies    Social History   Socioeconomic History  . Marital status: Married    Spouse name: Not on file  . Number of children: Not on file  . Years of education: Not on file  . Highest education level: Not on file  Occupational History  . Not on file  Social Needs  . Financial resource strain: Not on file  . Food insecurity:    Worry: Not on file    Inability: Not on file  . Transportation needs:    Medical: Not on file    Non-medical: Not on file  Tobacco Use  . Smoking status: Never Smoker  . Smokeless tobacco: Never Used  Substance and Sexual Activity  . Alcohol use: No  . Drug use: No  . Sexual activity: Yes    Partners: Male    Birth control/protection: Surgical  Lifestyle  . Physical activity:    Days per week: Not on file    Minutes per session: Not on file  . Stress: Not on file  Relationships  . Social connections:    Talks on phone: Not on file    Gets together: Not on file    Attends religious service: Not on file    Active member of club or organization: Not on file    Attends meetings of clubs or organizations: Not on file    Relationship status: Not on file  . Intimate partner violence:    Fear of current or ex partner: Not on file    Emotionally abused: Not on file    Physically abused: Not on file    Forced sexual activity: Not on file  Other Topics Concern  . Not on file  Social History Narrative  . Not on file    Physical Exam  Constitutional: She is oriented to person, place, and time.  Cardiovascular: Normal rate and  regular rhythm.  Pulmonary/Chest: Effort normal and breath sounds normal.  Abdominal: Soft.  Musculoskeletal: Normal range of motion. She exhibits edema.  Neurological: She is alert and oriented to person, place, and time.  Skin: Skin is warm and dry.  Psychiatric: She has a normal mood and affect.        Future Appointments  Date Time Provider [place]  03/02/2018 10:00 AM [facility] ROOM [facility] None  03/30/2018  2:20 PM Bensimhon, Shaune Pascal, MD [facility] None    BP 132/70 (BP Location: Left Arm, Patient Position: Sitting, Cuff Size: Large)   Pulse 96   Resp 16   Wt (!) 342 lb (  155.1 kg)   SpO2 98%   BMI 60.58 kg/m   Weight yesterday- 344 lb Last visit weight- 340 lb   Ms Kostelecky was seen at home today and reported feeling generally well. She denied SOB, headache or orthopnea. She is under a lot of stress right now because her mother's health is continuing to fail and her mother has expressed wanting to stop treatment for renal failure. She is being compliant with her medications through this difficult time and is keeping her counseling appointments. She did not need anything today beyond medications verification and refilling her pillbox. He pillbox is missing the lid the the "BED" section so I told her I would try to get her a new one. Two medications need to be added to her medication lise and have been listed below.  Taking mirtazapine 30 mg at bedtime Taking Trazodone 25 mg at bedtime PRN  Time spent with patient: 33 minutes  [name], EMT 01/30/18  ACTION: Home visit completed Next visit planned for 1 week

## 2018-02-03 ENCOUNTER — Telehealth: Payer: Self-pay | Admitting: Internal Medicine

## 2018-02-03 DIAGNOSIS — L309 Dermatitis, unspecified: Secondary | ICD-10-CM

## 2018-02-03 NOTE — Telephone Encounter (Signed)
Patient called and stated that she used the tube of cream and that the sore under her arm is still there and you wanted her to inform you if it was so that she can get a derm referral. Please let me know so I can call the patient back at [phone]

## 2018-02-11 ENCOUNTER — Telehealth: Payer: Self-pay

## 2018-02-11 ENCOUNTER — Other Ambulatory Visit: Payer: Self-pay

## 2018-02-11 ENCOUNTER — Other Ambulatory Visit: Payer: Medicare HMO | Admitting: Internal Medicine

## 2018-02-11 DIAGNOSIS — Z515 Encounter for palliative care: Secondary | ICD-10-CM

## 2018-02-11 MED ORDER — RIVAROXABAN 20 MG PO TABS: 20.0000 mg | ORAL_TABLET | Freq: Every day | ORAL | 3 refills | Status: DC

## 2018-02-11 NOTE — Telephone Encounter (Signed)
As per Abelino Derrick, [employer] [place], the case is still active as they are working with her.

## 2018-02-13 ENCOUNTER — Other Ambulatory Visit (HOSPITAL_COMMUNITY): Payer: Self-pay | Admitting: *Deleted

## 2018-02-13 ENCOUNTER — Telehealth: Payer: Self-pay

## 2018-02-13 ENCOUNTER — Telehealth (HOSPITAL_COMMUNITY): Payer: Self-pay

## 2018-02-13 DIAGNOSIS — R0602 Shortness of breath: Secondary | ICD-10-CM | POA: Diagnosis not present

## 2018-02-13 DIAGNOSIS — I272 Pulmonary hypertension, unspecified: Secondary | ICD-10-CM | POA: Diagnosis not present

## 2018-02-13 DIAGNOSIS — I2724 Chronic thromboembolic pulmonary hypertension: Secondary | ICD-10-CM | POA: Diagnosis not present

## 2018-02-13 DIAGNOSIS — G4733 Obstructive sleep apnea (adult) (pediatric): Secondary | ICD-10-CM | POA: Diagnosis not present

## 2018-02-13 NOTE — Telephone Encounter (Signed)
Per Dr. Janace Hoard contact [facility] to see if I can call in Tylneol #4.  [place] to call in rx for Tylenol #4 for pt. Spoke with Lovena Le. Gave same directions and quantity as the June rx.

## 2018-02-13 NOTE — Telephone Encounter (Signed)
I called [name] [name] to see if she was available to meet this afternoon. She stated she was home but just beginning to get over a virus and wanted to wait until next week to meet. We have schedule an appointment at her house for 09:30 on Tuesday June 11.

## 2018-02-13 NOTE — Telephone Encounter (Signed)
I received call from [facility] stating that the Tyl$3 rxn that was written for in 01/2018 but dated for 02/08/2018 can not be filled without new rxn sent because it is post dated with start date of 02/08/2018. Request new rxn be sent e-script.  I do not have ability to send rxn on narcotics as yet.  Requested my CMA call in the rxn.

## 2018-02-17 ENCOUNTER — Other Ambulatory Visit (HOSPITAL_COMMUNITY): Payer: Self-pay | Admitting: *Deleted

## 2018-02-17 ENCOUNTER — Other Ambulatory Visit (HOSPITAL_COMMUNITY): Payer: Self-pay

## 2018-02-17 MED ORDER — SPIRONOLACTONE 25 MG PO TABS: 12.5000 mg | ORAL_TABLET | Freq: Every day | ORAL | 3 refills | Status: DC

## 2018-02-17 NOTE — Progress Notes (Signed)
Paramedicine Encounter    Patient ID: [name] [name], female    DOB: 07/26/1961, 57 y.o.   MRN: [id number]   Patient Care Team: Ladell Pier, MD as PCP - General (Internal Medicine)  Patient Active Problem List   Diagnosis Date Noted  . Shortness of breath 01/16/2018  . Palliative care patient 01/16/2018  . Major depressive disorder, recurrent severe without psychotic features ([place]) 12/06/2017  . CHF exacerbation ([place]) 08/21/2017  . (HFpEF) heart failure with preserved ejection fraction ([place]) 08/20/2017  . Lumbar radiculopathy 08/08/2017  . Morbid obesity with BMI of 50.0-59.9, adult ([place]) 08/08/2017  . Hoarseness 08/08/2017  . Chronic cough 04/10/2017  . Rheumatoid arthritis involving multiple sites ([place]) 04/10/2017  . Anxiety and depression 04/10/2017  . Adjustment disorder with depressed mood 12/11/2016  . Morbid obesity ([place]) 08/14/2016  . Stroke ([place]) 06/27/2016  . Right sided weakness 06/27/2016  . Essential hypertension 06/27/2016  . Atypical chest pain 06/27/2016  . Difficult airway 03/29/2016  . CTEPH (chronic thromboembolic pulmonary hypertension) ([place]) 03/14/2016  . OSA on CPAP 02/20/2016  . Chronic pain syndrome 01/31/2016  . Pulmonary embolus ([place]) 11/15/2015  . Vertigo 11/15/2015  . Depression 11/15/2015    Current Outpatient Medications:  .  acetaminophen-codeine (TYLENOL #4) 300-60 MG tablet, Take 1 tablet by mouth every 8 (eight) hours as needed for moderate pain., Disp: 90 tablet, Rfl: 0 .  albuterol (PROVENTIL HFA;VENTOLIN HFA) 108 (90 Base) MCG/ACT inhaler, Inhale 1-2 puffs into the lungs every 6 (six) hours as needed for wheezing., Disp: 3 Inhaler, Rfl: 6 .  ALPRAZolam (XANAX) 1 MG tablet, Take 1 mg by mouth as needed for anxiety., Disp: , Rfl:  .  atorvastatin (LIPITOR) 20 MG tablet, Take 1 tablet (20 mg total) by mouth daily at 6 PM., Disp: 90 tablet, Rfl: 3 .  cyclobenzaprine (FLEXERIL) 5 MG tablet, Take 1 tablet (5 mg total) by mouth 2  (two) times daily as needed. for muscle spams, Disp: 40 tablet, Rfl: 1 .  docusate sodium (COLACE) 100 MG capsule, Take 1 capsule (100 mg total) by mouth every 12 (twelve) hours., Disp: 30 capsule, Rfl: 0 .  ferrous sulfate (FERROUSUL) 325 (65 FE) MG tablet, Take 1 tablet (325 mg total) by mouth daily with breakfast., Disp: 90 tablet, Rfl: 3 .  fluticasone (FLONASE) 50 MCG/ACT nasal spray, Place 2 sprays into both nostrils daily. (Patient taking differently: Place 2 sprays into both nostrils daily as needed for allergies. ), Disp: 16 g, Rfl: 6 .  furosemide (LASIX) 40 MG tablet, Take 1 tablet (40 mg total) by mouth daily. Take extra 40 mg tablet once in the afternoon AS NEEDED for weight gain 3 lbs or more., Disp: 180 tablet, Rfl: 3 .  gabapentin (NEURONTIN) 300 MG capsule, Take 2 capsules (600 mg total) by mouth 3 (three) times daily., Disp: 540 capsule, Rfl: 3 .  meclizine (ANTIVERT) 25 MG tablet, TAKE ONE TABLET BY MOUTH THREE TIMES DAILY AS NEEDED FOR  DIZZINESS, Disp: 60 tablet, Rfl: 0 .  omeprazole (PRILOSEC) 40 MG capsule, Take 1 capsule (40 mg total) by mouth 2 (two) times daily., Disp: 180 capsule, Rfl: 3 .  Riociguat (ADEMPAS) 1 MG TABS, Take 1 mg by mouth 3 (three) times daily. At 0700, 1500 and 2300, Disp: , Rfl:  .  rivaroxaban (XARELTO) 20 MG TABS tablet, Take 1 tablet (20 mg total) by mouth daily with supper., Disp: 90 tablet, Rfl: 3 .  spironolactone (ALDACTONE) 25 MG tablet, Take 0.5 tablets (12.5  mg total) by mouth daily., Disp: 45 tablet, Rfl: 3 .  zolpidem (AMBIEN) 10 MG tablet, Take 10 mg by mouth at bedtime as needed for sleep., Disp: , Rfl:  .  acetaminophen-codeine (TYLENOL #4) 300-60 MG tablet, Take 1 tablet by mouth every 8 (eight) hours as needed for moderate pain. (Patient not taking: Reported on 01/30/2018), Disp: 90 tablet, Rfl: 0 .  benzonatate (TESSALON) 100 MG capsule, Take 1 capsule (100 mg total) by mouth every 8 (eight) hours. (Patient not taking: Reported on  01/23/2018), Disp: 21 capsule, Rfl: 0 .  clotrimazole-betamethasone (LOTRISONE) cream, Apply 1 application topically 2 (two) times daily. (Patient not taking: Reported on 02/17/2018), Disp: 30 g, Rfl: 0 .  cyclobenzaprine (FLEXERIL) 10 MG tablet, Take 10 mg by mouth at bedtime., Disp: , Rfl:  .  loratadine (CLARITIN) 10 MG tablet, Take 1 tablet (10 mg total) by mouth daily. (Patient not taking: Reported on 01/23/2018), Disp: 90 tablet, Rfl: 2 No Known Allergies    Social History   Socioeconomic History  . Marital status: Married    Spouse name: Not on file  . Number of children: Not on file  . Years of education: Not on file  . Highest education level: Not on file  Occupational History  . Not on file  Social Needs  . Financial resource strain: Not on file  . Food insecurity:    Worry: Not on file    Inability: Not on file  . Transportation needs:    Medical: Not on file    Non-medical: Not on file  Tobacco Use  . Smoking status: Never Smoker  . Smokeless tobacco: Never Used  Substance and Sexual Activity  . Alcohol use: No  . Drug use: No  . Sexual activity: Yes    Partners: Male    Birth control/protection: Surgical  Lifestyle  . Physical activity:    Days per week: Not on file    Minutes per session: Not on file  . Stress: Not on file  Relationships  . Social connections:    Talks on phone: Not on file    Gets together: Not on file    Attends religious service: Not on file    Active member of club or organization: Not on file    Attends meetings of clubs or organizations: Not on file    Relationship status: Not on file  . Intimate partner violence:    Fear of current or ex partner: Not on file    Emotionally abused: Not on file    Physically abused: Not on file    Forced sexual activity: Not on file  Other Topics Concern  . Not on file  Social History Narrative  . Not on file    Physical Exam  Constitutional: She is oriented to person, place, and time.   Cardiovascular: Normal rate and regular rhythm.  Pulmonary/Chest: Effort normal and breath sounds normal.  Abdominal: Soft.  Musculoskeletal: Normal range of motion. She exhibits edema.  Neurological: She is alert and oriented to person, place, and time.  Skin: Skin is warm and dry.  Psychiatric: She has a normal mood and affect.        Future Appointments  Date Time Provider [place]  03/02/2018 10:00 AM [facility] ROOM [facility] None  03/30/2018  2:20 PM Bensimhon, Shaune Pascal, MD [facility] None    BP (!) 150/90 (BP Location: Left Arm, Patient Position: Sitting, Cuff Size: Large)   Pulse 81   Resp 18  Wt (!) 343 lb 3.2 oz (155.7 kg)   SpO2 98%   BMI 60.80 kg/m   Weight yesterday- 341 lb  Last visit weight- 342 lb  [name] [name] was seen at home today and reported feeling "about the same." She reported that she has been taking her medications but does not think that her care team is providing her the help she needs. She is upset that she has not had an increase in her Adempas since is was lowered due to hypotension. She believes that if she had not asked her PCP about taking potassium, there never would have been blood work ordered to check her levels ergo she would still be taking it. I explained that the clinic ordered the labs to be done concurrently with her port flush at [facility] so she would not have to be stuck more than one time but she insisted that labs were her idea and the HF team is not being diligent in her care. She said she felt dismissed by Dr Haroldine Laws after the last time she saw him because she said she was "almost in the red" and he replied that she "always feels that way." I tried to explain that he was trying to establish a baseline and see where she is in comparison to her "normal" but was dismissed this explanation. She continued saying she does not feel like anyone is listening to her and said she wants to seek another physician to get a second opinion.  I explained that it is her right to seek a second opinion and if she does not feel like she is being cared for adequately, then she should get a second opinion. She said that she continues to have intermittent chest pain and significant SOB on exertion but refuses to wear oxygen while out of the house because "every family member who has even gone on oxygen has ended up dead not long afterwards." She reports wearing the oxygen between 70-80% of the time when she is home and keeps it in the care so if she gets SOB while she is out she can go sit with it in the car. I tried to explain that wearing the oxygen is going to help prevent the exertional dyspnea and possibly the chest pains but she says she "don't care, if this is it then this is it." I will bring all of this to the clinic meeting on Wednesday but I do not know what else can be done for [name] [name] that is not currently being done.      [name] [name], EMT 02/17/18  ACTION: Home visit completed Next visit planned for 1 week

## 2018-02-18 ENCOUNTER — Other Ambulatory Visit: Payer: Self-pay | Admitting: Internal Medicine

## 2018-02-18 DIAGNOSIS — M5442 Lumbago with sciatica, left side: Secondary | ICD-10-CM

## 2018-02-23 ENCOUNTER — Telehealth: Payer: Self-pay | Admitting: Licensed Clinical Social Worker

## 2018-02-23 NOTE — Telephone Encounter (Signed)
CSW received referral from paramedic to follow up with patient for supportive intervention. Patient's husband answered phone and stated she was sleeping. Husband states he will have patient call back later. CSW continues to follow through community paramedicine program. Raquel Sarna, [place], [place]

## 2018-02-25 ENCOUNTER — Telehealth: Payer: Self-pay | Admitting: Licensed Clinical Social Worker

## 2018-02-25 NOTE — Telephone Encounter (Signed)
CSW contacted patient to offer support. Patient states she is attending therapy weekly at [facility] and sees a psychologist monthly. Patient reports she is taking it one day at a time and feels some improvement. Patient appeared to be in good spirits and seems motivated for improved health. CSW available as needed. Raquel Sarna, [place], [place]

## 2018-02-27 DIAGNOSIS — F332 Major depressive disorder, recurrent severe without psychotic features: Secondary | ICD-10-CM | POA: Diagnosis not present

## 2018-02-28 ENCOUNTER — Telehealth (HOSPITAL_COMMUNITY): Payer: Self-pay

## 2018-02-28 NOTE — Telephone Encounter (Signed)
Pt called to see if she needed assistance with her pill box due to her CHP zack being on vacation.  She stated that she filled it last night.  Pt stated that she feels good and declined CHP visit

## 2018-03-02 ENCOUNTER — Encounter (HOSPITAL_COMMUNITY)
Admission: RE | Admit: 2018-03-02 | Discharge: 2018-03-02 | Disposition: A | Discharge status: Home / Self Care | Payer: Medicare HMO | Source: Ambulatory Visit | Attending: Internal Medicine | Admitting: Internal Medicine

## 2018-03-02 ENCOUNTER — Encounter (HOSPITAL_COMMUNITY): Payer: Self-pay

## 2018-03-02 ENCOUNTER — Telehealth: Payer: Self-pay | Admitting: Internal Medicine

## 2018-03-02 DIAGNOSIS — I5022 Chronic systolic (congestive) heart failure: Secondary | ICD-10-CM | POA: Diagnosis not present

## 2018-03-02 DIAGNOSIS — I2782 Chronic pulmonary embolism: Secondary | ICD-10-CM | POA: Diagnosis not present

## 2018-03-02 DIAGNOSIS — Z452 Encounter for adjustment and management of vascular access device: Secondary | ICD-10-CM | POA: Insufficient documentation

## 2018-03-02 LAB — COMPREHENSIVE METABOLIC PANEL
ALT: 14 U/L (ref 14–54)
AST: 16 U/L (ref 15–41)
Albumin: 3.7 g/dL (ref 3.5–5.0)
Alkaline Phosphatase: 79 U/L (ref 38–126)
Anion gap: 6 (ref 5–15)
BUN: 14 mg/dL (ref 6–20)
CO2: 30 mmol/L (ref 22–32)
Calcium: 9.3 mg/dL (ref 8.9–10.3)
Chloride: 109 mmol/L (ref 101–111)
Creatinine, Ser: 1.11 mg/dL — ABNORMAL HIGH (ref 0.44–1.00)
GFR calc Af Amer: >60 mL/min (ref >60)
GFR calc non Af Amer: 55 mL/min — ABNORMAL LOW (ref >60)
Glucose, Bld: 104 mg/dL — ABNORMAL HIGH (ref 65–99)
Potassium: 3.9 mmol/L (ref 3.5–5.1)
Sodium: 145 mmol/L (ref 135–145)
Total Bilirubin: 0.6 mg/dL (ref 0.3–1.2)
Total Protein: 7 g/dL (ref 6.5–8.1)

## 2018-03-02 LAB — CBC WITH DIFFERENTIAL/PLATELET
Basophils Absolute: 0 10*3/uL (ref 0.0–0.1)
Basophils Relative: 0 %
Eosinophils Absolute: 0.1 10*3/uL (ref 0.0–0.7)
Eosinophils Relative: 2 %
HCT: 37.2 % (ref 36.0–46.0)
Hemoglobin: 11.7 g/dL — ABNORMAL LOW (ref 12.0–15.0)
Lymphocytes Relative: 31 %
Lymphs Abs: 1.9 10*3/uL (ref 0.7–4.0)
MCH: 21.1 pg — ABNORMAL LOW (ref 26.0–34.0)
MCHC: 31.5 g/dL (ref 30.0–36.0)
MCV: 67 fL — ABNORMAL LOW (ref 78.0–100.0)
Monocytes Absolute: 0.5 10*3/uL (ref 0.1–1.0)
Monocytes Relative: 8 %
Neutro Abs: 3.7 10*3/uL (ref 1.7–7.7)
Neutrophils Relative %: 59 %
Platelets: 315 10*3/uL (ref 150–400)
RBC: 5.55 MIL/uL — ABNORMAL HIGH (ref 3.87–5.11)
RDW: 17.8 % — ABNORMAL HIGH (ref 11.5–15.5)
WBC: 6.2 10*3/uL (ref 4.0–10.5)

## 2018-03-02 LAB — T4, FREE: Free T4: 1 ng/dL (ref 0.82–1.77)

## 2018-03-02 LAB — TSH: TSH: 1.107 u[IU]/mL (ref 0.350–4.500)

## 2018-03-02 MED ORDER — SODIUM CHLORIDE 0.9% FLUSH
10.0000 mL | INTRAVENOUS | Status: AC
  Administered 2018-03-02: 10 mL via INTRAVENOUS
  Filled 2018-03-02: qty 10

## 2018-03-02 MED ORDER — HEPARIN SOD (PORK) LOCK FLUSH 100 UNIT/ML IV SOLN
500.0000 [IU] | INTRAVENOUS | Status: AC
  Administered 2018-03-02: 500 [IU]
  Filled 2018-03-02: qty 5

## 2018-03-02 NOTE — Telephone Encounter (Signed)
Patient called requesting to know if it's ok if she uses cbd oil for her anxiety and her pain. Please f/u with patient.

## 2018-03-02 NOTE — Telephone Encounter (Signed)
Will forward to pcp

## 2018-03-04 NOTE — Telephone Encounter (Signed)
Contacted pt to go over Dr. [name] response pt didn't answer left a detailed vm informing pt and if she has any questions or concerns to give me a call

## 2018-03-07 NOTE — Progress Notes (Signed)
    PALLIATIVE CARE CONSULT VISIT   PATIENT NAME: [name] [name] DOB: 01-Dec-1960 MRN: [id number]  PRIMARY CARE PROVIDER:   Ladell Pier, MD  REFERRING PROVIDER:  Ladell Pier, MD [place], [place] [address]   NOTE: Phone call to patient to confirm appointment time.  She stated that she would need to reschedule appointment because she was ill with a virus/diarrhea.  Appt. Was rescheduled.      Gonzella Lex, NP

## 2018-03-10 ENCOUNTER — Telehealth (HOSPITAL_COMMUNITY): Payer: Self-pay

## 2018-03-10 NOTE — Telephone Encounter (Signed)
Pt called prior to [facility] visit; pt asked for a call prior to our visit.  No answer or call back/message left on vm

## 2018-03-15 DIAGNOSIS — R0602 Shortness of breath: Secondary | ICD-10-CM | POA: Diagnosis not present

## 2018-03-15 DIAGNOSIS — G4733 Obstructive sleep apnea (adult) (pediatric): Secondary | ICD-10-CM | POA: Diagnosis not present

## 2018-03-15 DIAGNOSIS — I2724 Chronic thromboembolic pulmonary hypertension: Secondary | ICD-10-CM | POA: Diagnosis not present

## 2018-03-18 ENCOUNTER — Other Ambulatory Visit (HOSPITAL_COMMUNITY): Payer: Self-pay

## 2018-03-18 NOTE — Progress Notes (Signed)
Paramedicine Encounter    Patient ID: [name] [name], female    DOB: [birth year], 57 y.o.   MRN: [id number]    Patient Care Team: Ladell Pier, MD as PCP - General (Internal Medicine)  Patient Active Problem List   Diagnosis Date Noted  . Shortness of breath 01/16/2018  . Palliative care patient 01/16/2018  . Major depressive disorder, recurrent severe without psychotic features ([place]) 12/06/2017  . CHF exacerbation ([place]) 08/21/2017  . (HFpEF) heart failure with preserved ejection fraction ([place]) 08/20/2017  . Lumbar radiculopathy 08/08/2017  . Morbid obesity with BMI of 50.0-59.9, adult ([place]) 08/08/2017  . Hoarseness 08/08/2017  . Chronic cough 04/10/2017  . Rheumatoid arthritis involving multiple sites ([place]) 04/10/2017  . Anxiety and depression 04/10/2017  . Adjustment disorder with depressed mood 12/11/2016  . Morbid obesity ([place]) 08/14/2016  . Stroke ([place]) 06/27/2016  . Right sided weakness 06/27/2016  . Essential hypertension 06/27/2016  . Atypical chest pain 06/27/2016  . Difficult airway 03/29/2016  . CTEPH (chronic thromboembolic pulmonary hypertension) ([place]) 03/14/2016  . OSA on CPAP 02/20/2016  . Chronic pain syndrome 01/31/2016  . Pulmonary embolus ([place]) 11/15/2015  . Vertigo 11/15/2015  . Depression 11/15/2015    Current Outpatient Medications:  .  albuterol (PROVENTIL HFA;VENTOLIN HFA) 108 (90 Base) MCG/ACT inhaler, Inhale 1-2 puffs into the lungs every 6 (six) hours as needed for wheezing., Disp: 3 Inhaler, Rfl: 6 .  ALPRAZolam (XANAX) 1 MG tablet, Take 1 mg by mouth as needed for anxiety., Disp: , Rfl:  .  atorvastatin (LIPITOR) 20 MG tablet, Take 1 tablet (20 mg total) by mouth daily at 6 PM., Disp: 90 tablet, Rfl: 3 .  cyclobenzaprine (FLEXERIL) 5 MG tablet, TAKE 1 TABLET BY MOUTH TWICE DAILY AS NEEDED FOR MUSCLE SPASM, Disp: 40 tablet, Rfl: 1 .  docusate sodium (COLACE) 100 MG capsule, Take 1 capsule (100 mg total) by mouth every 12 (twelve) hours.,  Disp: 30 capsule, Rfl: 0 .  acetaminophen-codeine (TYLENOL #4) 300-60 MG tablet, Take 1 tablet by mouth every 8 (eight) hours as needed for moderate pain., Disp: 90 tablet, Rfl: 0 .  acetaminophen-codeine (TYLENOL #4) 300-60 MG tablet, Take 1 tablet by mouth every 8 (eight) hours as needed for moderate pain. (Patient not taking: Reported on 01/30/2018), Disp: 90 tablet, Rfl: 0 .  benzonatate (TESSALON) 100 MG capsule, Take 1 capsule (100 mg total) by mouth every 8 (eight) hours. (Patient not taking: Reported on 01/23/2018), Disp: 21 capsule, Rfl: 0 .  clotrimazole-betamethasone (LOTRISONE) cream, Apply 1 application topically 2 (two) times daily. (Patient not taking: Reported on 02/17/2018), Disp: 30 g, Rfl: 0 .  cyclobenzaprine (FLEXERIL) 10 MG tablet, Take 10 mg by mouth at bedtime., Disp: , Rfl:  .  ferrous sulfate (FERROUSUL) 325 (65 FE) MG tablet, Take 1 tablet (325 mg total) by mouth daily with breakfast., Disp: 90 tablet, Rfl: 3 .  fluticasone (FLONASE) 50 MCG/ACT nasal spray, Place 2 sprays into both nostrils daily. (Patient taking differently: Place 2 sprays into both nostrils daily as needed for allergies. ), Disp: 16 g, Rfl: 6 .  furosemide (LASIX) 40 MG tablet, Take 1 tablet (40 mg total) by mouth daily. Take extra 40 mg tablet once in the afternoon AS NEEDED for weight gain 3 lbs or more., Disp: 180 tablet, Rfl: 3 .  gabapentin (NEURONTIN) 300 MG capsule, Take 2 capsules (600 mg total) by mouth 3 (three) times daily., Disp: 540 capsule, Rfl: 3 .  loratadine (CLARITIN) 10 MG tablet, Take  1 tablet (10 mg total) by mouth daily. (Patient not taking: Reported on 01/23/2018), Disp: 90 tablet, Rfl: 2 .  meclizine (ANTIVERT) 25 MG tablet, TAKE ONE TABLET BY MOUTH THREE TIMES DAILY AS NEEDED FOR  DIZZINESS, Disp: 60 tablet, Rfl: 0 .  omeprazole (PRILOSEC) 40 MG capsule, Take 1 capsule (40 mg total) by mouth 2 (two) times daily., Disp: 180 capsule, Rfl: 3 .  Riociguat (ADEMPAS) 1 MG TABS, Take 1 mg by  mouth 3 (three) times daily. At 0700, 1500 and 2300, Disp: , Rfl:  .  rivaroxaban (XARELTO) 20 MG TABS tablet, Take 1 tablet (20 mg total) by mouth daily with supper., Disp: 90 tablet, Rfl: 3 .  spironolactone (ALDACTONE) 25 MG tablet, Take 0.5 tablets (12.5 mg total) by mouth daily., Disp: 45 tablet, Rfl: 3 .  zolpidem (AMBIEN) 10 MG tablet, Take 10 mg by mouth at bedtime as needed for sleep., Disp: , Rfl:  No Known Allergies   Social History   Socioeconomic History  . Marital status: Married    Spouse name: Not on file  . Number of children: Not on file  . Years of education: Not on file  . Highest education level: Not on file  Occupational History  . Not on file  Social Needs  . Financial resource strain: Not on file  . Food insecurity:    Worry: Not on file    Inability: Not on file  . Transportation needs:    Medical: Not on file    Non-medical: Not on file  Tobacco Use  . Smoking status: Never Smoker  . Smokeless tobacco: Never Used  Substance and Sexual Activity  . Alcohol use: No  . Drug use: No  . Sexual activity: Yes    Partners: Male    Birth control/protection: Surgical  Lifestyle  . Physical activity:    Days per week: Not on file    Minutes per session: Not on file  . Stress: Not on file  Relationships  . Social connections:    Talks on phone: Not on file    Gets together: Not on file    Attends religious service: Not on file    Active member of club or organization: Not on file    Attends meetings of clubs or organizations: Not on file    Relationship status: Not on file  . Intimate partner violence:    Fear of current or ex partner: Not on file    Emotionally abused: Not on file    Physically abused: Not on file    Forced sexual activity: Not on file  Other Topics Concern  . Not on file  Social History Narrative  . Not on file    Physical Exam  Pulmonary/Chest: Effort normal. No respiratory distress.  Abdominal: Soft.  Skin: Skin is warm  and dry.        Future Appointments  Date Time Provider [place]  03/30/2018  2:20 PM Bensimhon, Shaune Pascal, MD [facility] None  04/03/2018 10:00 AM [facility] ROOM [facility] None  04/13/2018  3:10 PM Ladell Pier, MD [facility] None  05/05/2018 11:30 AM [facility] ROOM [facility] None  06/05/2018 11:00 AM [facility] ROOM [facility] None  07/08/2018 10:00 AM [facility] ROOM [facility] None  08/04/2018 11:00 AM [facility] ROOM [facility] None  08/28/2018 11:00 AM [facility] ROOM [facility] None    ATF pt CAO x4 laying down in her bedroom. Today is our initial meeting due to the relocation of CHP's.  Pt stated that she  is tired of going to the doctors "because no one ever listens to her".  Pt stated that she has been taking all of her medications, but she's having trouble sleeping throughout the night.  Pt stated that she has taken all of the different sleep aide prescribed to her but nothing works.  Pt denies sob, chest pain and dizziness.  Pt agrees to have me come over for visits.  Mirtazapine 45 mg nightly for depression  BP 102/72 (BP Location: Right Wrist, Patient Position: Sitting, Cuff Size: Normal)   Pulse 90   Resp 16   Wt (!) 342 lb (155.1 kg)   SpO2 98%   BMI 60.58 kg/m    Jennie Bolar, EMT Paramedic 03/19/2018    ACTION: Home visit completed

## 2018-03-19 ENCOUNTER — Encounter (HOSPITAL_COMMUNITY): Payer: Self-pay

## 2018-03-24 ENCOUNTER — Telehealth: Payer: Self-pay | Admitting: Internal Medicine

## 2018-03-24 DIAGNOSIS — M5416 Radiculopathy, lumbar region: Secondary | ICD-10-CM

## 2018-03-24 NOTE — Telephone Encounter (Signed)
Patient called asking for a refill on acetaminophen-codeine (TYLENOL #4) 300-60 MG tablet [[id number]]  cyclobenzaprine (FLEXERIL) 10 MG tablet [[id number]]

## 2018-03-25 DIAGNOSIS — L439 Lichen planus, unspecified: Secondary | ICD-10-CM | POA: Diagnosis not present

## 2018-03-25 DIAGNOSIS — D485 Neoplasm of uncertain behavior of skin: Secondary | ICD-10-CM | POA: Diagnosis not present

## 2018-03-27 MED ORDER — ACETAMINOPHEN-CODEINE #4 300-60 MG PO TABS: 1.0000 | ORAL_TABLET | Freq: Three times a day (TID) | ORAL | 0 refills | Status: DC | PRN

## 2018-03-27 NOTE — Telephone Encounter (Signed)
Pt is aware.  

## 2018-03-27 NOTE — Telephone Encounter (Signed)
Patient requested refill on Tylenol No. 4's.  [place] controlled substance reporting system reviewed and is appropriate.  Patient is on pain management agreement with [place].  1Refill given.

## 2018-03-30 ENCOUNTER — Encounter (HOSPITAL_COMMUNITY): Payer: Self-pay | Admitting: Internal Medicine

## 2018-03-30 ENCOUNTER — Telehealth: Payer: Self-pay | Admitting: Internal Medicine

## 2018-03-30 ENCOUNTER — Ambulatory Visit (HOSPITAL_COMMUNITY)
Admission: RE | Admit: 2018-03-30 | Discharge: 2018-03-30 | Disposition: A | Discharge status: Home / Self Care | Payer: Medicare HMO | Source: Ambulatory Visit | Attending: Internal Medicine | Admitting: Internal Medicine

## 2018-03-30 ENCOUNTER — Other Ambulatory Visit: Payer: Self-pay

## 2018-03-30 ENCOUNTER — Telehealth: Payer: Self-pay

## 2018-03-30 VITALS — BP 134/67 | HR 103 | Wt 342.5 lb

## 2018-03-30 DIAGNOSIS — Z6841 Body Mass Index (BMI) 40.0 and over, adult: Secondary | ICD-10-CM | POA: Insufficient documentation

## 2018-03-30 DIAGNOSIS — Z79899 Other long term (current) drug therapy: Secondary | ICD-10-CM | POA: Diagnosis not present

## 2018-03-30 DIAGNOSIS — F419 Anxiety disorder, unspecified: Secondary | ICD-10-CM | POA: Insufficient documentation

## 2018-03-30 DIAGNOSIS — G894 Chronic pain syndrome: Secondary | ICD-10-CM | POA: Insufficient documentation

## 2018-03-30 DIAGNOSIS — G8929 Other chronic pain: Secondary | ICD-10-CM | POA: Diagnosis not present

## 2018-03-30 DIAGNOSIS — I509 Heart failure, unspecified: Secondary | ICD-10-CM | POA: Diagnosis not present

## 2018-03-30 DIAGNOSIS — R079 Chest pain, unspecified: Secondary | ICD-10-CM | POA: Insufficient documentation

## 2018-03-30 DIAGNOSIS — I272 Pulmonary hypertension, unspecified: Secondary | ICD-10-CM | POA: Diagnosis not present

## 2018-03-30 DIAGNOSIS — F329 Major depressive disorder, single episode, unspecified: Secondary | ICD-10-CM | POA: Diagnosis not present

## 2018-03-30 DIAGNOSIS — E875 Hyperkalemia: Secondary | ICD-10-CM | POA: Diagnosis not present

## 2018-03-30 DIAGNOSIS — I110 Hypertensive heart disease with heart failure: Secondary | ICD-10-CM | POA: Insufficient documentation

## 2018-03-30 DIAGNOSIS — Z9049 Acquired absence of other specified parts of digestive tract: Secondary | ICD-10-CM | POA: Insufficient documentation

## 2018-03-30 DIAGNOSIS — G4733 Obstructive sleep apnea (adult) (pediatric): Secondary | ICD-10-CM | POA: Diagnosis not present

## 2018-03-30 DIAGNOSIS — Z90710 Acquired absence of both cervix and uterus: Secondary | ICD-10-CM | POA: Insufficient documentation

## 2018-03-30 DIAGNOSIS — M797 Fibromyalgia: Secondary | ICD-10-CM | POA: Insufficient documentation

## 2018-03-30 DIAGNOSIS — Z9889 Other specified postprocedural states: Secondary | ICD-10-CM | POA: Insufficient documentation

## 2018-03-30 DIAGNOSIS — R42 Dizziness and giddiness: Secondary | ICD-10-CM | POA: Diagnosis not present

## 2018-03-30 DIAGNOSIS — I959 Hypotension, unspecified: Secondary | ICD-10-CM | POA: Diagnosis not present

## 2018-03-30 DIAGNOSIS — E785 Hyperlipidemia, unspecified: Secondary | ICD-10-CM | POA: Diagnosis not present

## 2018-03-30 DIAGNOSIS — F332 Major depressive disorder, recurrent severe without psychotic features: Secondary | ICD-10-CM | POA: Diagnosis not present

## 2018-03-30 DIAGNOSIS — Z7901 Long term (current) use of anticoagulants: Secondary | ICD-10-CM | POA: Diagnosis not present

## 2018-03-30 DIAGNOSIS — I2724 Chronic thromboembolic pulmonary hypertension: Secondary | ICD-10-CM | POA: Diagnosis not present

## 2018-03-30 MED ORDER — RIOCIGUAT 1 MG PO TABS: 1.0000 mg | ORAL_TABLET | Freq: Three times a day (TID) | ORAL | 11 refills | Status: DC

## 2018-03-30 MED ORDER — MECLIZINE HCL 25 MG PO TABS: ORAL_TABLET | ORAL | 0 refills | Status: DC

## 2018-03-30 MED ORDER — RIOCIGUAT 1.5 MG PO TABS: 1.5000 mg | ORAL_TABLET | Freq: Three times a day (TID) | ORAL | 11 refills | Status: DC

## 2018-03-30 NOTE — Progress Notes (Signed)
New rx for Adempas 1.5 mg TID called into [facility] at [phone]

## 2018-03-30 NOTE — Progress Notes (Signed)
ADVANCED HF CLINIC NOTE  Date:  03/30/2018   ID:  [name] [name], DOB 1961-01-23, MRN [id number]  PCP:  Ladell Pier, MD  HF MD: Dr Haroldine Laws    HPI: [name] [name] is a 57 y.o. 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   Patient was in her [place] until 08/2015 when she started getting symptoms of dyspnea on exertion, dizziness, bilateral LE swelling, and constant R>L non-specific chest pain. These symptoms progressively worsened over several days prompting her to go to [facility] in her then-hometown of [place] in 09/2015 where she was diagnosed with PE and started on anticoagulation. She was very briefly on warfarin but quickly switched to Xarelto. She was referred to [facility] in 12/2015. chocardiogram (12-2015) showed normal LVEF 55-60%, normal LA, normal RV size, RVSP estimated as 70 mm Hg. She further was referred to [facility] and saw Dr. Karena Addison on 03/14/2016 where patient had several studies including V/Q scan which found high prob for PE; studies c/w CTEPH. It was felt that her chronic chest pain was caused by chronic PE. RHC/LHC/Pulm Angiogram 03/2016 confirmed diagnosis of CTEPH. She underwent pulmonary thromboembolectomy 03/31/2016 at [facility] with IVC filter placement. Pre-op cath with no CAD.   Seen by Dr. Karena Addison in 04/2016 for follow up. He felt like her heart size was decreasing. Plan was for lifelong [place] and ECHO and V/Q in 4 months. Dr. Karena Addison has since left the [place] practice.  CT angio at [facility] on 05/15/16 showed " persistent but partially recanalized PE within right lower lobe pulmonary artery. No new PE identified. "  She was admitted to [facility] in 06/2016 for continued chest pain and parasthesias. V/Q scan on 06/27/16 showed prominent ventilation perfusion mismatch in right  c/w chronic PE. Head CT negative. She was continued on Xarelto and told to follow up with [facility]. Patient doesn't want to have to travel to [facility] for care so [place] care here in [place].   Admitted earlier this month with SOB and chest pain. Diuresed with IV lasix and transitioned to lasix 40 mg daily + 12.5 mg spiro. Seen by HF team and set up for [place] as an outpatient. Discharge 336 pounds.   Admitted to [place] for suicidal ideation. She was then sent to [facility] for additional treatment. She has ongoing psychiatric follow up.   She presents today for regular follow up. Main complaint is her dizziness. She continues to have her chronic chest pain, no set pattern, provocations, relieving factors, or ways to make it better. Same with her dizziness. Meclizine helped, but she has been off for a couple months. Took extra lasix a couple of days last week on vacation in [place]. Remains SOB with exertion. Bathing is the worst, but on a good day she can get dressed. Would have to stop on her way to the elevator from clinic. Remains in paramedicine. She felt poorly when her paramedic was switched without telling her prior. Taking all medications as directed.  Requires assistance from transportation from [facility].   08/21/17 Echo 60-65%.RV ok. PA pressures could not be estimated. 04/2017: EF 55-60%  RV normal RVSP 48mmHG  RHC 08/26/2017 RA = 6 RV = 50/7 PA = 48/13 (30) PCW = 6 Fick cardiac output/index = 7.0/2.9 PVR = 3.1 WU Ao sat = 97% PA  sat = 67%, 70% SVC sat = 70% Assessment: Mild PAH (on Adempas 1mg  tid) with high cardiac output. No evidence of intracardiac shunting.   10/2016 RHC RA = 8 RV = 64/13 PA = 65/19 (35) PCW = 9 Fick cardiac output/index = 7.5/3.1 PVR = 3.5 WU Ao sat = 96% PA sat = 68%,70% SVC sat = 71% Assessment: 1. Mild residual PAH in the setting of CTEPH s/p pulmonary thrombolectomy 2. Normal cardiac output 3. Normal left-sided filling pressures  Past  Medical History:  Diagnosis Date  . Arthritis   . CHF exacerbation ([place]) 08/21/2017  . Depression   . Diverticulitis   . Hyperlipidemia   . Hypertension   . Obesity   . Pneumonia   . Pulmonary embolism [facility])     Past Surgical History:  Procedure Laterality Date  . ABDOMINAL HYSTERECTOMY    . BREAST REDUCTION SURGERY    . CHOLECYSTECTOMY    . EMBOLECTOMY N/A    pulmonary embolectomy  . REDUCTION MAMMAPLASTY Bilateral   . RIGHT HEART CATH N/A 10/28/2016   Procedure: Right Heart Cath;  Surgeon: Jolaine Artist, MD;  Location: [place] CV LAB;  Service: Cardiovascular;  Laterality: N/A;  . RIGHT HEART CATH N/A 08/26/2017   Procedure: RIGHT HEART CATH;  Surgeon: Jolaine Artist, MD;  Location: [place] CV LAB;  Service: Cardiovascular;  Laterality: N/A;  . ULTRASOUND GUIDANCE FOR VASCULAR ACCESS  08/26/2017   Procedure: Ultrasound Guidance For Vascular Access;  Surgeon: Jolaine Artist, MD;  Location: [place] CV LAB;  Service: Cardiovascular;;    Current Medications:  Current Outpatient Medications on File Prior to Encounter  Medication Sig Dispense Refill  . acetaminophen-codeine (TYLENOL #4) 300-60 MG tablet Take 1 tablet by mouth every 8 (eight) hours as needed for moderate pain. 90 tablet 0  . albuterol (PROVENTIL HFA;VENTOLIN HFA) 108 (90 Base) MCG/ACT inhaler Inhale 1-2 puffs into the lungs every 6 (six) hours as needed for wheezing. 3 Inhaler 6  . ALPRAZolam (XANAX) 1 MG tablet Take 1 mg by mouth as needed for anxiety.    Marland Kitchen atorvastatin (LIPITOR) 20 MG tablet Take 1 tablet (20 mg total) by mouth daily at 6 PM. 90 tablet 3  . benzonatate (TESSALON) 100 MG capsule Take 1 capsule (100 mg total) by mouth every 8 (eight) hours. 21 capsule 0  . clotrimazole-betamethasone (LOTRISONE) cream Apply 1 application topically 2 (two) times daily. 30 g 0  . cyclobenzaprine (FLEXERIL) 5 MG tablet TAKE 1 TABLET BY MOUTH TWICE DAILY AS NEEDED FOR MUSCLE SPASM 40 tablet 1    . docusate sodium (COLACE) 100 MG capsule Take 1 capsule (100 mg total) by mouth every 12 (twelve) hours. 30 capsule 0  . ferrous sulfate (FERROUSUL) 325 (65 FE) MG tablet Take 1 tablet (325 mg total) by mouth daily with breakfast. 90 tablet 3  . fluticasone (FLONASE) 50 MCG/ACT nasal spray Place 2 sprays into both nostrils daily. (Patient taking differently: Place 2 sprays into both nostrils daily as needed for allergies. ) 16 g 6  . furosemide (LASIX) 40 MG tablet Take 1 tablet (40 mg total) by mouth daily. Take extra 40 mg tablet once in the afternoon AS NEEDED for weight gain 3 lbs or more. 180 tablet 3  . gabapentin (NEURONTIN) 300 MG capsule Take 2 capsules (600 mg total) by mouth 3 (three) times daily. 540 capsule 3  . loratadine (CLARITIN) 10 MG tablet Take 1 tablet (10 mg total) by mouth daily. 90 tablet  2  . meclizine (ANTIVERT) 25 MG tablet TAKE ONE TABLET BY MOUTH THREE TIMES DAILY AS NEEDED FOR  DIZZINESS 60 tablet 0  . omeprazole (PRILOSEC) 40 MG capsule Take 1 capsule (40 mg total) by mouth 2 (two) times daily. 180 capsule 3  . Riociguat (ADEMPAS) 1 MG TABS Take 1 mg by mouth 3 (three) times daily. At 0700, 1500 and 2300    . rivaroxaban (XARELTO) 20 MG TABS tablet Take 1 tablet (20 mg total) by mouth daily with supper. 90 tablet 3  . spironolactone (ALDACTONE) 25 MG tablet Take 0.5 tablets (12.5 mg total) by mouth daily. 45 tablet 3  . zolpidem (AMBIEN) 10 MG tablet Take 10 mg by mouth at bedtime as needed for sleep.     No current facility-administered medications on file prior to encounter.     Allergies:   [name].   Social History   Socioeconomic History  . Marital status: Married    Spouse name: Not on file  . Number of children: Not on file  . Years of education: Not on file  . Highest education level: Not on file  Occupational History  . Not on file  Social Needs  . Financial resource strain: Not on file  . Food insecurity:    Worry: Not  on file    Inability: Not on file  . Transportation needs:    Medical: Not on file    Non-medical: Not on file  Tobacco Use  . Smoking status: Never Smoker  . Smokeless tobacco: Never Used  Substance and Sexual Activity  . Alcohol use: No  . Drug use: No  . Sexual activity: Yes    Partners: Male    Birth control/protection: Surgical  Lifestyle  . Physical activity:    Days per week: Not on file    Minutes per session: Not on file  . Stress: Not on file  Relationships  . Social connections:    Talks on phone: Not on file    Gets together: Not on file    Attends religious service: Not on file    Active member of club or organization: Not on file    Attends meetings of clubs or organizations: Not on file    Relationship status: Not on file  Other Topics Concern  . Not on file  Social History Narrative  . Not on file     Family History:  The patient's family history includes Anxiety disorder in her cousin, sister, and sister; Bipolar disorder in her sister and sister; Dementia in her father and mother; Drug abuse in her sister and sister; Lung cancer in her maternal grandmother; Sexual abuse in her maternal aunt.      ROS:   Please see the history of present illness.    Review of Systems  Cardiovascular: Positive for dyspnea on exertion, leg swelling and near-syncope.  Respiratory: Positive for shortness of breath.   Musculoskeletal: Positive for back pain, joint pain and joint swelling.  Neurological: Positive for dizziness and weakness.  Psychiatric/Behavioral: Positive for depression.    PHYSICAL EXAM:    Vitals:   03/30/18 1349  BP: 134/67  Pulse: (!) 103  SpO2: 95%  Weight: (!) 342 lb 8 oz (155.4 kg)    Wt Readings from Last 3 Encounters:  03/30/18 (!) 342 lb 8 oz (155.4 kg)  03/18/18 (!) 342 lb (155.1 kg)  02/17/18 (!) 343 lb 3.2 oz (155.7 kg)    General:  NAD.  HEENT: normal Neck: supple. JVP difficult. Carotids 2+ bilat; no bruits. No lymphadenopathy  or thryomegaly appreciated. Cor: PMI nondisplaced. Regular rate & rhythm. No rubs, gallops or murmurs. Lungs: clear Abdomen: soft, nontender, nondistended. No hepatosplenomegaly. No bruits or masses. Good bowel sounds. Extremities: no cyanosis, clubbing, or rash. 1+ ankle edema.  Neuro: alert & orientedx3, cranial nerves grossly intact. moves all 4 extremities w/o difficulty. Affect pleasant   Studies/Labs Reviewed:    Recent Labs: 12/26/2017: B Natriuretic Peptide 14.0 03/02/2018: ALT 14; BUN 14; Creatinine, Ser 1.11; Hemoglobin 11.7; Platelets 315; Potassium 3.9; Sodium 145; TSH 1.107   Lipid Panel    Component Value Date/Time   CHOL 166 06/27/2016 0346   TRIG 125 06/27/2016 0346   HDL 33 (L) 06/27/2016 0346   CHOLHDL 5.0 06/27/2016 0346   VLDL 25 06/27/2016 0346   LDLCALC 108 (H) 06/27/2016 0346    Additional studies/ records that were reviewed today include:   ECHO 8/18 EF 55-60%  RV normal RVSP 34mmHG   2D ECHO: 12/15/2015 LV EF: 55% -   60% Study Conclusions - Left ventricle: The cavity size was normal. Wall thickness was normal. Systolic function was normal. The estimated ejection  fraction was in the range of 55% to 60%. - Pulmonary arteries: PA peak pressure: 70 mm Hg (S). - Pericardium, extracardiac: A trivial pericardial effusion was   identified.   2D ECHO 04/05/16 INTERPRETATION:NORMAL LEFT VENTRICULAR FUNCTION WITH MILD LVHSEVERE RV SYSTOLIC DYSFUNCTION (See above) VALVULAR REGURGITATION: TRIVIAL PR, MILD TR NO VALVULAR STENOSIS TRIVIAL PERICARDIAL EFFUSION Compared with prior Echo study on 03/14/2016: RVSP increased from 50 to 69   CATH right heart and coronary angiography: 03/25/2016 [place] Component Name Value Ref Range  Cardiac Index (l/min/m2) 2.3 L/min/m2  Right Atrium Mean Pressure (mmHg) 11 mmHg   Right Ventricle Systolic Pressure (mmHg) 68 mmHg   Pulmonary Artery Mean Pressure (mmHg) 40 mmHg   Pulmonary Wedge Pressure  (mmHg) 14 mmHg   Pulmonary Vascular Resistance (Wood units) 4.7    CT angio 05/15/16 IMPRESSION: Persistent but partially recanalized pulmonary embolus within the right lower lobe pulmonary artery. No new pulmonary embolism is identified. Minimal bibasilar atelectatic changes stable from the prior exam.   V/Q scan 06/27/16 IMPRESSION: Prominent ventilation perfusion mismatch noted on the right. Finding suggests high probability right-sided pulmonary embolus in this patient with known right-sided pulmonary embolus.   ASSESSMENT & PLAN:   1. Dizziness -  Will refill meclizine.   2. Chronic chest pain:  - Has chronic PE s/p thrombectomy, LHC performed at [facility] 7/17 with no CAD.  - No changes or angina type episodes.     3. 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 No bleeding.  - Recent echo 8/18 with no evidence RV strain or PAH (RVSP estimated 50mmHG) -  - Increase adempas to 1.5 mg TID - Volume status mildly elevated - Continue lasix 40 mg daily with an extra 40 mg daily as needed for 3 pound weight gain. Can take extra today.   4. Hypotension - Relatively stable.   5. Fibromyalgia/Rheumatoid Arthitis:  - Follows with Rheumatology. No change.   6. Morbid obesity with OHS:  - Body mass index is 60.67 kg/m.  - Needs weight loss.   7. OSA - Continue nightly CPAP.    8. Depression/anxiety - Receiving outpatient therapy.   - Per PCP.  9. Hyperkalemia  - BMET Today.  - Previously instructed to stop Mrs. Dash.    Shirley Friar, PA-C  1:58 PM   Patient seen and examined with the above-signed Advanced Practice Provider and/or Housestaff. I personally reviewed laboratory data, imaging studies and relevant notes. I independently examined the patient and formulated the important aspects of the plan. I have edited the note to reflect any of  my changes or salient points. I have personally discussed the plan with the patient and/or family.  Overall stable. Still with intermittent CP without change. Volume status looks ok. PA pressures still mildly elevated on previous RHC. Will re-attempt to increase adempas to 1.5 tid as BP tolerates. Will see back in 3 months with repeat echo. Emphasized need for weight loss.   Glori Bickers, MD  2:24 PM

## 2018-03-30 NOTE — Patient Instructions (Signed)
INCREASE Adempas to 1.5mg  three times daily.  Follow up and echo in 3 months with Dr.Bensimhon.

## 2018-03-30 NOTE — Telephone Encounter (Signed)
G89.4

## 2018-03-30 NOTE — Telephone Encounter (Signed)
Pharmacy called in regards to the tylenol #4 rx that Dr. [name] wrote, they need a DX code more specific than what is currently associated with the rx (m54.16)

## 2018-03-30 NOTE — Telephone Encounter (Signed)
Returned [place] call and she is just needing a new order for port flush for patient. I informed Karena Addison that I was at another office but I can fax it to her this afternoon or tomorrow morning. I also informed her that Dr. [name] is out of the office this week and the MD will be signing the order. Karena Addison stated that is fine

## 2018-03-30 NOTE — Addendum Note (Signed)
Encounter addended by: Scarlette Calico, RN on: 03/30/2018 3:08 PM  Actions taken: Order list changed, Sign clinical note, Specialty comments modified

## 2018-03-30 NOTE — Telephone Encounter (Signed)
[name] from [facility] called today requesting to speak with pcp or pcp nurse regarding patient. Please fu at your earliest convenience.

## 2018-03-30 NOTE — Telephone Encounter (Signed)
Pt called to follow up on pharmacy issue, she informed me she is completley out and in pain, I let her know the situation and that I would send as a high priority to Dr. Margarita Rana, pt was told we may be able to resolve today or tomorrow morning. I also let the pt know we would follow up with her to let her know once this has been straightened out. She was very understanding.

## 2018-03-31 ENCOUNTER — Other Ambulatory Visit (HOSPITAL_COMMUNITY): Payer: Self-pay | Admitting: General Practice

## 2018-03-31 ENCOUNTER — Other Ambulatory Visit (HOSPITAL_COMMUNITY): Payer: Self-pay

## 2018-03-31 NOTE — Telephone Encounter (Signed)
Spoke w/ pharmacy, gave updated dx code and it was found to be satisfactory by the pharmacy, they will fill it as a Doctor, general practice. Called pt, her husband answered the phone and reported the pt was asleep. He was notified that the issue at the pharmacy had been resolved and that it would be available for pickup shortly. I let him know if she had any issues she could call me directly at [phone] if there were any further issues. He understood and thanked me for my help.

## 2018-03-31 NOTE — Progress Notes (Signed)
Paramedicine Encounter    Patient ID: [name] [name], female    DOB: 1960/09/24, 57 y.o.   MRN: [id number]    Patient Care Team: Ladell Pier, MD as PCP - General (Internal Medicine)  Patient Active Problem List   Diagnosis Date Noted  . Shortness of breath 01/16/2018  . Palliative care patient 01/16/2018  . Major depressive disorder, recurrent severe without psychotic features ([place]) 12/06/2017  . CHF exacerbation ([place]) 08/21/2017  . (HFpEF) heart failure with preserved ejection fraction ([place]) 08/20/2017  . Lumbar radiculopathy 08/08/2017  . Morbid obesity with BMI of 50.0-59.9, adult ([place]) 08/08/2017  . Hoarseness 08/08/2017  . Chronic cough 04/10/2017  . Rheumatoid arthritis involving multiple sites ([place]) 04/10/2017  . Anxiety and depression 04/10/2017  . Adjustment disorder with depressed mood 12/11/2016  . Morbid obesity ([place]) 08/14/2016  . Stroke ([place]) 06/27/2016  . Right sided weakness 06/27/2016  . Essential hypertension 06/27/2016  . Atypical chest pain 06/27/2016  . Difficult airway 03/29/2016  . CTEPH (chronic thromboembolic pulmonary hypertension) ([place]) 03/14/2016  . OSA on CPAP 02/20/2016  . Chronic pain syndrome 01/31/2016  . Pulmonary embolus ([place]) 11/15/2015  . Vertigo 11/15/2015  . Depression 11/15/2015    Current Outpatient Medications:  .  acetaminophen-codeine (TYLENOL #4) 300-60 MG tablet, Take 1 tablet by mouth every 8 (eight) hours as needed for moderate pain., Disp: 90 tablet, Rfl: 0 .  albuterol (PROVENTIL HFA;VENTOLIN HFA) 108 (90 Base) MCG/ACT inhaler, Inhale 1-2 puffs into the lungs every 6 (six) hours as needed for wheezing., Disp: 3 Inhaler, Rfl: 6 .  ALPRAZolam (XANAX) 1 MG tablet, Take 1 mg by mouth as needed for anxiety., Disp: , Rfl:  .  atorvastatin (LIPITOR) 20 MG tablet, Take 1 tablet (20 mg total) by mouth daily at 6 PM., Disp: 90 tablet, Rfl: 3 .  benzonatate (TESSALON) 100 MG capsule, Take 1 capsule (100 mg total) by mouth  every 8 (eight) hours., Disp: 21 capsule, Rfl: 0 .  clotrimazole-betamethasone (LOTRISONE) cream, Apply 1 application topically 2 (two) times daily., Disp: 30 g, Rfl: 0 .  cyclobenzaprine (FLEXERIL) 5 MG tablet, TAKE 1 TABLET BY MOUTH TWICE DAILY AS NEEDED FOR MUSCLE SPASM, Disp: 40 tablet, Rfl: 1 .  docusate sodium (COLACE) 100 MG capsule, Take 1 capsule (100 mg total) by mouth every 12 (twelve) hours., Disp: 30 capsule, Rfl: 0 .  ferrous sulfate (FERROUSUL) 325 (65 FE) MG tablet, Take 1 tablet (325 mg total) by mouth daily with breakfast., Disp: 90 tablet, Rfl: 3 .  fluticasone (FLONASE) 50 MCG/ACT nasal spray, Place 2 sprays into both nostrils daily. (Patient taking differently: Place 2 sprays into both nostrils daily as needed for allergies. ), Disp: 16 g, Rfl: 6 .  furosemide (LASIX) 40 MG tablet, Take 1 tablet (40 mg total) by mouth daily. Take extra 40 mg tablet once in the afternoon AS NEEDED for weight gain 3 lbs or more., Disp: 180 tablet, Rfl: 3 .  gabapentin (NEURONTIN) 300 MG capsule, Take 2 capsules (600 mg total) by mouth 3 (three) times daily., Disp: 540 capsule, Rfl: 3 .  loratadine (CLARITIN) 10 MG tablet, Take 1 tablet (10 mg total) by mouth daily., Disp: 90 tablet, Rfl: 2 .  meclizine (ANTIVERT) 25 MG tablet, TAKE ONE TABLET BY MOUTH THREE TIMES DAILY AS NEEDED FOR  DIZZINESS, Disp: 60 tablet, Rfl: 0 .  omeprazole (PRILOSEC) 40 MG capsule, Take 1 capsule (40 mg total) by mouth 2 (two) times daily., Disp: 180 capsule, Rfl: 3 .  Riociguat (ADEMPAS) 1.5 MG TABS, Take 1.5 mg by mouth 3 (three) times daily., Disp: 90 tablet, Rfl: 11 .  rivaroxaban (XARELTO) 20 MG TABS tablet, Take 1 tablet (20 mg total) by mouth daily with supper., Disp: 90 tablet, Rfl: 3 .  spironolactone (ALDACTONE) 25 MG tablet, Take 0.5 tablets (12.5 mg total) by mouth daily., Disp: 45 tablet, Rfl: 3 .  zolpidem (AMBIEN) 10 MG tablet, Take 10 mg by mouth at bedtime as needed for sleep., Disp: , Rfl:  No Known  Allergies   Social History   Socioeconomic History  . Marital status: Married    Spouse name: Not on file  . Number of children: Not on file  . Years of education: Not on file  . Highest education level: Not on file  Occupational History  . Not on file  Social Needs  . Financial resource strain: Not on file  . Food insecurity:    Worry: Not on file    Inability: Not on file  . Transportation needs:    Medical: Not on file    Non-medical: Not on file  Tobacco Use  . Smoking status: Never Smoker  . Smokeless tobacco: Never Used  Substance and Sexual Activity  . Alcohol use: No  . Drug use: No  . Sexual activity: Yes    Partners: Male    Birth control/protection: Surgical  Lifestyle  . Physical activity:    Days per week: Not on file    Minutes per session: Not on file  . Stress: Not on file  Relationships  . Social connections:    Talks on phone: Not on file    Gets together: Not on file    Attends religious service: Not on file    Active member of club or organization: Not on file    Attends meetings of clubs or organizations: Not on file    Relationship status: Not on file  . Intimate partner violence:    Fear of current or ex partner: Not on file    Emotionally abused: Not on file    Physically abused: Not on file    Forced sexual activity: Not on file  Other Topics Concern  . Not on file  Social History Narrative  . Not on file    Physical Exam  Pulmonary/Chest: Effort normal. No respiratory distress. She has no wheezes. She has no rales.  Abdominal: Soft. There is no tenderness. There is no guarding.  Musculoskeletal: She exhibits no edema.  Skin: She is not diaphoretic.        Future Appointments  Date Time Provider [place]  04/03/2018 10:00 AM [facility] ROOM [facility] None  04/13/2018  3:10 PM Ladell Pier, MD [facility] None  05/05/2018 11:30 AM [facility] ROOM [facility] None  06/05/2018 11:00 AM [facility] ROOM [facility] None  07/08/2018 10:00  AM [facility] ROOM [facility] None  08/04/2018 11:00 AM [facility] ROOM [facility] None  08/28/2018 11:00 AM [facility] ROOM [facility] None     Pulse 83   Wt (!) 340 lb (154.2 kg)   SpO2 97%   BMI 60.23 kg/m   Weight yesterday-342   ATF pt CAO x4 sitting in her wheelchair with no complaints.  Pt stated that she is taking her pain meds and sleep meds PRN, she told her psychiatrist the same yesterday.  He increased the xanax to 5mg  for her anxiety.  She stated that she becomes anxious when she leaves the house.  She agreed to start taking the xanax before  leaving the house. pt advised that she will continue taking adempas 1mg  3x days until the new prescription of 1.5 comes in next week. Pt denies any changes in the sob and dizziness, she stated that she always have both. Pt filled her own pill box prior to our visit.     Medication ordered: none  Issabella Rix, EMT Paramedic [phone] 03/31/2018    ACTION: Home visit completed

## 2018-04-03 ENCOUNTER — Encounter (HOSPITAL_COMMUNITY)
Admission: RE | Admit: 2018-04-03 | Discharge: 2018-04-03 | Disposition: A | Discharge status: Home / Self Care | Payer: Medicare HMO | Source: Ambulatory Visit | Attending: Internal Medicine | Admitting: Internal Medicine

## 2018-04-03 ENCOUNTER — Encounter (HOSPITAL_COMMUNITY): Payer: Self-pay

## 2018-04-03 DIAGNOSIS — I5022 Chronic systolic (congestive) heart failure: Secondary | ICD-10-CM | POA: Diagnosis not present

## 2018-04-03 DIAGNOSIS — Z452 Encounter for adjustment and management of vascular access device: Secondary | ICD-10-CM | POA: Insufficient documentation

## 2018-04-03 DIAGNOSIS — I2782 Chronic pulmonary embolism: Secondary | ICD-10-CM | POA: Diagnosis not present

## 2018-04-03 MED ORDER — SODIUM CHLORIDE 0.9% FLUSH
10.0000 mL | INTRAVENOUS | Status: DC
  Administered 2018-04-03: 10 mL via INTRAVENOUS

## 2018-04-03 MED ORDER — HEPARIN SOD (PORK) LOCK FLUSH 100 UNIT/ML IV SOLN
500.0000 [IU] | INTRAVENOUS | Status: DC
  Administered 2018-04-03: 500 [IU] via INTRAVENOUS
  Filled 2018-04-03: qty 5

## 2018-04-09 ENCOUNTER — Other Ambulatory Visit: Payer: Self-pay | Admitting: Internal Medicine

## 2018-04-09 DIAGNOSIS — Z1231 Encounter for screening mammogram for malignant neoplasm of breast: Secondary | ICD-10-CM

## 2018-04-13 ENCOUNTER — Telehealth: Payer: Self-pay

## 2018-04-13 ENCOUNTER — Encounter: Payer: Self-pay | Admitting: Internal Medicine

## 2018-04-13 ENCOUNTER — Ambulatory Visit: Payer: Medicare HMO | Attending: Internal Medicine | Admitting: Internal Medicine

## 2018-04-13 VITALS — BP 124/83 | HR 87 | Temp 98.3°F | Resp 16 | Wt 335.4 lb

## 2018-04-13 DIAGNOSIS — I110 Hypertensive heart disease with heart failure: Secondary | ICD-10-CM | POA: Diagnosis not present

## 2018-04-13 DIAGNOSIS — M5416 Radiculopathy, lumbar region: Secondary | ICD-10-CM | POA: Diagnosis not present

## 2018-04-13 DIAGNOSIS — F329 Major depressive disorder, single episode, unspecified: Secondary | ICD-10-CM

## 2018-04-13 DIAGNOSIS — Z9049 Acquired absence of other specified parts of digestive tract: Secondary | ICD-10-CM | POA: Insufficient documentation

## 2018-04-13 DIAGNOSIS — Z7901 Long term (current) use of anticoagulants: Secondary | ICD-10-CM | POA: Insufficient documentation

## 2018-04-13 DIAGNOSIS — F4321 Adjustment disorder with depressed mood: Secondary | ICD-10-CM | POA: Diagnosis not present

## 2018-04-13 DIAGNOSIS — I5032 Chronic diastolic (congestive) heart failure: Secondary | ICD-10-CM | POA: Diagnosis not present

## 2018-04-13 DIAGNOSIS — G4733 Obstructive sleep apnea (adult) (pediatric): Secondary | ICD-10-CM | POA: Insufficient documentation

## 2018-04-13 DIAGNOSIS — Z9889 Other specified postprocedural states: Secondary | ICD-10-CM | POA: Insufficient documentation

## 2018-04-13 DIAGNOSIS — M797 Fibromyalgia: Secondary | ICD-10-CM | POA: Diagnosis not present

## 2018-04-13 DIAGNOSIS — Z6841 Body Mass Index (BMI) 40.0 and over, adult: Secondary | ICD-10-CM | POA: Diagnosis not present

## 2018-04-13 DIAGNOSIS — M170 Bilateral primary osteoarthritis of knee: Secondary | ICD-10-CM | POA: Diagnosis not present

## 2018-04-13 DIAGNOSIS — Z8673 Personal history of transient ischemic attack (TIA), and cerebral infarction without residual deficits: Secondary | ICD-10-CM | POA: Insufficient documentation

## 2018-04-13 DIAGNOSIS — G894 Chronic pain syndrome: Secondary | ICD-10-CM | POA: Insufficient documentation

## 2018-04-13 DIAGNOSIS — Z801 Family history of malignant neoplasm of trachea, bronchus and lung: Secondary | ICD-10-CM | POA: Diagnosis not present

## 2018-04-13 DIAGNOSIS — I2724 Chronic thromboembolic pulmonary hypertension: Secondary | ICD-10-CM | POA: Diagnosis not present

## 2018-04-13 DIAGNOSIS — Z90710 Acquired absence of both cervix and uterus: Secondary | ICD-10-CM | POA: Diagnosis not present

## 2018-04-13 DIAGNOSIS — Z79899 Other long term (current) drug therapy: Secondary | ICD-10-CM | POA: Diagnosis not present

## 2018-04-13 DIAGNOSIS — M069 Rheumatoid arthritis, unspecified: Secondary | ICD-10-CM | POA: Diagnosis not present

## 2018-04-13 DIAGNOSIS — F32A Depression, unspecified: Secondary | ICD-10-CM

## 2018-04-13 NOTE — Progress Notes (Signed)
Patient ID: [name] [name], female    DOB: 1960/10/07  MRN: [id number]  CC: Follow-up   Subjective: [name] [name] is a 57 y.o. female who presents for chronic ds management. 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  Told it was non-malignant  PAH/CTEPH: Saw Dr. Haroldine Laws since last visit.  Dose of Adempas increased.  Tolerating the increased dose.  She denies any bruising or bleeding on Xarelto.  Obesity: loss 8 lbs since last visit.  Earlier this year she had requested referral to bariatric surgeon to be considered for weight reduction surgery but that was put on hold when she experienced significant depression.  She states that she still wants to explore weight reduction surgery at some point but has too much going on right now. -She is trying to do better with her eating habits.  "I was addicted to chocolate." She does not buy it any more.  Avoid sodas and juices  Depression:  Doing a lot better and is pleased with her therapist and psychiatrist.  Reports compliance with her medications  Knee pain: She is requesting referral to orthopedics, Dr Erlinda Hong.  Had BL arthroscopic surgery about 4-5 yrs ago. They have been bothering her more recently.  Endorses swelling, stiffness and pain.  Worse at nights.  Some popping LT knee.  No falls.    Lower back is still bothersome but she tries to take the Tylenol for this only when she needs to.  She is moving a bit more. Patient Active Problem List   Diagnosis Date Noted  . Shortness of breath 01/16/2018  . Palliative care patient 01/16/2018  . Major depressive disorder, recurrent severe without psychotic features  ([place]) 12/06/2017  . CHF exacerbation ([place]) 08/21/2017  . (HFpEF) heart failure with preserved ejection fraction ([place]) 08/20/2017  . Lumbar radiculopathy 08/08/2017  . Morbid obesity with BMI of 50.0-59.9, adult ([place]) 08/08/2017  . Hoarseness 08/08/2017  . Chronic cough 04/10/2017  . Rheumatoid arthritis involving multiple sites ([place]) 04/10/2017  . Anxiety and depression 04/10/2017  . Adjustment disorder with depressed mood 12/11/2016  . Morbid obesity ([place]) 08/14/2016  . Stroke ([place]) 06/27/2016  . Right sided weakness 06/27/2016  . Essential hypertension 06/27/2016  . Atypical chest pain 06/27/2016  . Difficult airway 03/29/2016  . CTEPH (chronic thromboembolic pulmonary hypertension) ([place]) 03/14/2016  . OSA on CPAP 02/20/2016  . Chronic pain syndrome 01/31/2016  . Pulmonary embolus ([place]) 11/15/2015  . Vertigo 11/15/2015  . Depression 11/15/2015     Current Outpatient Medications on File Prior to Visit  Medication Sig Dispense Refill  . acetaminophen-codeine (TYLENOL #4) 300-60 MG tablet Take 1 tablet by mouth every 8 (eight) hours as needed for moderate pain. 90 tablet 0  . albuterol (PROVENTIL HFA;VENTOLIN HFA) 108 (90 Base) MCG/ACT inhaler Inhale 1-2 puffs into the lungs every 6 (six) hours as needed for wheezing. 3 Inhaler 6  . ALPRAZolam (XANAX) 1 MG tablet Take 1 mg by mouth as needed for anxiety.    Marland Kitchen atorvastatin (LIPITOR) 20 MG tablet Take 1 tablet (20 mg total) by mouth daily at 6 PM. 90 tablet 3  . benzonatate (TESSALON) 100 MG capsule Take 1 capsule (100 mg total)  by mouth every 8 (eight) hours. 21 capsule 0  . clotrimazole-betamethasone (LOTRISONE) cream Apply 1 application topically 2 (two) times daily. 30 g 0  . cyclobenzaprine (FLEXERIL) 5 MG tablet TAKE 1 TABLET BY MOUTH TWICE DAILY AS NEEDED FOR MUSCLE SPASM 40 tablet 1  . docusate sodium (COLACE) 100 MG capsule Take 1 capsule (100 mg total) by mouth every 12 (twelve) hours. 30 capsule 0  . ferrous sulfate  (FERROUSUL) 325 (65 FE) MG tablet Take 1 tablet (325 mg total) by mouth daily with breakfast. 90 tablet 3  . fluticasone (FLONASE) 50 MCG/ACT nasal spray Place 2 sprays into both nostrils daily. (Patient taking differently: Place 2 sprays into both nostrils daily as needed for allergies. ) 16 g 6  . furosemide (LASIX) 40 MG tablet Take 1 tablet (40 mg total) by mouth daily. Take extra 40 mg tablet once in the afternoon AS NEEDED for weight gain 3 lbs or more. 180 tablet 3  . gabapentin (NEURONTIN) 300 MG capsule Take 2 capsules (600 mg total) by mouth 3 (three) times daily. 540 capsule 3  . loratadine (CLARITIN) 10 MG tablet Take 1 tablet (10 mg total) by mouth daily. 90 tablet 2  . meclizine (ANTIVERT) 25 MG tablet TAKE ONE TABLET BY MOUTH THREE TIMES DAILY AS NEEDED FOR  DIZZINESS 60 tablet 0  . omeprazole (PRILOSEC) 40 MG capsule Take 1 capsule (40 mg total) by mouth 2 (two) times daily. 180 capsule 3  . Riociguat (ADEMPAS) 1.5 MG TABS Take 1.5 mg by mouth 3 (three) times daily. 90 tablet 11  . rivaroxaban (XARELTO) 20 MG TABS tablet Take 1 tablet (20 mg total) by mouth daily with supper. 90 tablet 3  . spironolactone (ALDACTONE) 25 MG tablet Take 0.5 tablets (12.5 mg total) by mouth daily. 45 tablet 3  . zolpidem (AMBIEN) 10 MG tablet Take 10 mg by mouth at bedtime as needed for sleep.     No current facility-administered medications on file prior to visit.     No Known Allergies  Social History   Socioeconomic History  . Marital status: Married    Spouse name: Not on file  . Number of children: Not on file  . Years of education: Not on file  . Highest education level: Not on file  Occupational History  . Not on file  Social Needs  . Financial resource strain: Not on file  . Food insecurity:    Worry: Not on file    Inability: Not on file  . Transportation needs:    Medical: Not on file    Non-medical: Not on file  Tobacco Use  . Smoking status: Never Smoker  . Smokeless  tobacco: Never Used  Substance and Sexual Activity  . Alcohol use: No  . Drug use: No  . Sexual activity: Yes    Partners: Male    Birth control/protection: Surgical  Lifestyle  . Physical activity:    Days per week: Not on file    Minutes per session: Not on file  . Stress: Not on file  Relationships  . Social connections:    Talks on phone: Not on file    Gets together: Not on file    Attends religious service: Not on file    Active member of club or organization: Not on file    Attends meetings of clubs or organizations: Not on file    Relationship status: Not on file  . Intimate partner violence:    Fear of current  or ex partner: Not on file    Emotionally abused: Not on file    Physically abused: Not on file    Forced sexual activity: Not on file  Other Topics Concern  . Not on file  Social History Narrative  . Not on file    Family History  Problem Relation Age of Onset  . Lung cancer Maternal Grandmother   . Dementia Mother   . Dementia Father   . Anxiety disorder Sister   . Bipolar disorder Sister   . Drug abuse Sister   . Sexual abuse Maternal Aunt   . Anxiety disorder Cousin   . Anxiety disorder Sister   . Bipolar disorder Sister   . Drug abuse Sister   . Breast cancer Neg Hx     Past Surgical History:  Procedure Laterality Date  . ABDOMINAL HYSTERECTOMY    . BREAST REDUCTION SURGERY    . CHOLECYSTECTOMY    . EMBOLECTOMY N/A    pulmonary embolectomy  . REDUCTION MAMMAPLASTY Bilateral   . RIGHT HEART CATH N/A 10/28/2016   Procedure: Right Heart Cath;  Surgeon: Jolaine Artist, MD;  Location: [facility];  Service: Cardiovascular;  Laterality: N/A;  . RIGHT HEART CATH N/A 08/26/2017   Procedure: RIGHT HEART CATH;  Surgeon: Jolaine Artist, MD;  Location: [facility];  Service: Cardiovascular;  Laterality: N/A;  . ULTRASOUND GUIDANCE FOR VASCULAR ACCESS  08/26/2017   Procedure: Ultrasound Guidance For Vascular Access;  Surgeon:  Jolaine Artist, MD;  Location: [facility];  Service: Cardiovascular;;    ROS: Review of Systems Chest: Just came back from a trip and feels like she has a little congestion in the throat.  No increased cough.  No fever. PHYSICAL EXAM: BP 124/83   Pulse 87   Temp 98.3 F (36.8 C) (Oral)   Resp 16   Wt (!) 335 lb 6.4 oz (152.1 kg)   SpO2 96%   BMI 59.41 kg/m   Wt Readings from Last 3 Encounters:  04/13/18 (!) 335 lb 6.4 oz (152.1 kg)  03/31/18 (!) 340 lb (154.2 kg)  03/30/18 (!) 342 lb 8 oz (155.4 kg)    Physical Exam  General appearance - alert, well appearing, obese female and in no distress Mental status - normal mood, behavior, speech, dress, motor activity, and thought processes Mouth - mucous membranes moist, pharynx normal without lesions Neck - supple, no significant adenopathy Chest - clear to auscultation, no wheezes, rales or rhonchi, symmetric air entry Heart - normal rate, regular rhythm, normal S1, S2, no murmurs, rubs, clicks or gallops Musculoskeletal - knees:  Large body habits.  Mild tenderness along the medical and lateral jt lines.  Mild discomfort with passive range of motion. Extremities -no lower extremity edema.  ASSESSMENT AND PLAN: 1. Primary osteoarthritis of both knees Likely diagnosis here.  Weight loss encouraged.  Commended her on losing 8 pounds so far. She is on Tylenol No. 4's for her back which can cover her for the knees also.  Referral submitted to Ortho per her request. - Ambulatory referral to Orthopedic Surgery  2. Lumbar radiculopathy   3. Class 3 severe obesity with serious comorbidity and body mass index (BMI) of 50.0 to 59.9 in adult, unspecified obesity type ([place]) Commended her on dietary changes.  Encouraged her to do more. Increase physical activity as tolerated.  4. Depression, unspecified depression type Much better control.  5. CTEPH (chronic thromboembolic pulmonary hypertension) ([place]) Followed by  cardiology.  Patient was given the opportunity to ask questions.  Patient verbalized understanding of the plan and was able to repeat key elements of the plan.   Orders Placed This Encounter  Procedures  . Ambulatory referral to Orthopedic Surgery     Requested Prescriptions    No prescriptions requested or ordered in this encounter    Return in about 3 months (around 07/14/2018).  Karle Plumber, MD, FACP

## 2018-04-13 NOTE — Telephone Encounter (Signed)
Phone call placed to patient to schedule follow up visit with NP. Scheduled for 04/14/18.

## 2018-04-14 ENCOUNTER — Other Ambulatory Visit: Payer: Medicare HMO | Admitting: Internal Medicine

## 2018-04-15 DIAGNOSIS — R0602 Shortness of breath: Secondary | ICD-10-CM | POA: Diagnosis not present

## 2018-04-15 DIAGNOSIS — G4733 Obstructive sleep apnea (adult) (pediatric): Secondary | ICD-10-CM | POA: Diagnosis not present

## 2018-04-15 DIAGNOSIS — I2724 Chronic thromboembolic pulmonary hypertension: Secondary | ICD-10-CM | POA: Diagnosis not present

## 2018-04-17 ENCOUNTER — Other Ambulatory Visit: Payer: Medicare HMO | Admitting: Internal Medicine

## 2018-04-17 NOTE — Telephone Encounter (Signed)
Medication Samples have been provided to the patient.  Drug name: Xarelto       Strength: 20 mg        Qty: 4  LOT: [id number]   Exp.Date: 06/21  Dosing instructions: Take 1 tablet (20 mg total) by mouth daily with supper.  The patient has been instructed regarding the correct time, dose, and frequency of taking this medication, including desired effects and most common side effects.   LEVITA MONICAL 3:58 PM 04/17/2018

## 2018-04-22 ENCOUNTER — Other Ambulatory Visit (HOSPITAL_COMMUNITY): Payer: Self-pay

## 2018-04-22 NOTE — Progress Notes (Signed)
I called pt to schedule a CHP visit for this week and she stated that she doesn't feel good today. She stated that she's gone out a couple of times this week without wearing oxygen and today she thinks that she has a stomach virus.  Pt asked me to come to check her vitals mainly her BP.  Upon my arrival pt is in restroom after a few mins her husband handed me the house phone; pt stated that she's in the restroom with diarrhea.  She stated that she cant come out to get evaluated and she requested that I come back tomorrow.  Pt stated that she will take some antidiarrhea over the counter med when she comes out of the restroom.  I told pt that I will call her tomorrow to see when it's a good time to visit.

## 2018-04-27 DIAGNOSIS — F332 Major depressive disorder, recurrent severe without psychotic features: Secondary | ICD-10-CM | POA: Diagnosis not present

## 2018-04-30 ENCOUNTER — Encounter (HOSPITAL_COMMUNITY): Payer: Self-pay

## 2018-04-30 ENCOUNTER — Telehealth (HOSPITAL_COMMUNITY): Payer: Self-pay

## 2018-04-30 ENCOUNTER — Other Ambulatory Visit (HOSPITAL_COMMUNITY): Payer: Self-pay

## 2018-04-30 NOTE — Progress Notes (Signed)
Paramedicine Encounter    Patient ID: [name] [name], female    DOB: 1961/01/30, 57 y.o.   MRN: [id number]    Patient Care Team: Ladell Pier, MD as PCP - General (Internal Medicine)  Patient Active Problem List   Diagnosis Date Noted  . Shortness of breath 01/16/2018  . Palliative care patient 01/16/2018  . Major depressive disorder, recurrent severe without psychotic features ([place]) 12/06/2017  . CHF exacerbation ([place]) 08/21/2017  . (HFpEF) heart failure with preserved ejection fraction ([place]) 08/20/2017  . Lumbar radiculopathy 08/08/2017  . Morbid obesity with BMI of 50.0-59.9, adult ([place]) 08/08/2017  . Hoarseness 08/08/2017  . Chronic cough 04/10/2017  . Rheumatoid arthritis involving multiple sites ([place]) 04/10/2017  . Anxiety and depression 04/10/2017  . Adjustment disorder with depressed mood 12/11/2016  . Morbid obesity ([place]) 08/14/2016  . Stroke ([place]) 06/27/2016  . Right sided weakness 06/27/2016  . Essential hypertension 06/27/2016  . Atypical chest pain 06/27/2016  . Difficult airway 03/29/2016  . CTEPH (chronic thromboembolic pulmonary hypertension) ([place]) 03/14/2016  . OSA on CPAP 02/20/2016  . Chronic pain syndrome 01/31/2016  . Pulmonary embolus ([place]) 11/15/2015  . Vertigo 11/15/2015  . Depression 11/15/2015    Current Outpatient Medications:  .  acetaminophen-codeine (TYLENOL #4) 300-60 MG tablet, Take 1 tablet by mouth every 8 (eight) hours as needed for moderate pain., Disp: 90 tablet, Rfl: 0 .  albuterol (PROVENTIL HFA;VENTOLIN HFA) 108 (90 Base) MCG/ACT inhaler, Inhale 1-2 puffs into the lungs every 6 (six) hours as needed for wheezing., Disp: 3 Inhaler, Rfl: 6 .  ALPRAZolam (XANAX) 1 MG tablet, Take 1 mg by mouth as needed for anxiety., Disp: , Rfl:  .  atorvastatin (LIPITOR) 20 MG tablet, Take 1 tablet (20 mg total) by mouth daily at 6 PM., Disp: 90 tablet, Rfl: 3 .  benzonatate (TESSALON) 100 MG capsule, Take 1 capsule (100 mg total) by mouth  every 8 (eight) hours., Disp: 21 capsule, Rfl: 0 .  clotrimazole-betamethasone (LOTRISONE) cream, Apply 1 application topically 2 (two) times daily., Disp: 30 g, Rfl: 0 .  cyclobenzaprine (FLEXERIL) 5 MG tablet, TAKE 1 TABLET BY MOUTH TWICE DAILY AS NEEDED FOR MUSCLE SPASM, Disp: 40 tablet, Rfl: 1 .  docusate sodium (COLACE) 100 MG capsule, Take 1 capsule (100 mg total) by mouth every 12 (twelve) hours., Disp: 30 capsule, Rfl: 0 .  ferrous sulfate (FERROUSUL) 325 (65 FE) MG tablet, Take 1 tablet (325 mg total) by mouth daily with breakfast., Disp: 90 tablet, Rfl: 3 .  fluticasone (FLONASE) 50 MCG/ACT nasal spray, Place 2 sprays into both nostrils daily. (Patient taking differently: Place 2 sprays into both nostrils daily as needed for allergies. ), Disp: 16 g, Rfl: 6 .  furosemide (LASIX) 40 MG tablet, Take 1 tablet (40 mg total) by mouth daily. Take extra 40 mg tablet once in the afternoon AS NEEDED for weight gain 3 lbs or more., Disp: 180 tablet, Rfl: 3 .  gabapentin (NEURONTIN) 300 MG capsule, Take 2 capsules (600 mg total) by mouth 3 (three) times daily., Disp: 540 capsule, Rfl: 3 .  loratadine (CLARITIN) 10 MG tablet, Take 1 tablet (10 mg total) by mouth daily., Disp: 90 tablet, Rfl: 2 .  meclizine (ANTIVERT) 25 MG tablet, TAKE ONE TABLET BY MOUTH THREE TIMES DAILY AS NEEDED FOR  DIZZINESS, Disp: 60 tablet, Rfl: 0 .  omeprazole (PRILOSEC) 40 MG capsule, Take 1 capsule (40 mg total) by mouth 2 (two) times daily., Disp: 180 capsule, Rfl: 3 .  Riociguat (ADEMPAS) 1.5 MG TABS, Take 1.5 mg by mouth 3 (three) times daily., Disp: 90 tablet, Rfl: 11 .  rivaroxaban (XARELTO) 20 MG TABS tablet, Take 1 tablet (20 mg total) by mouth daily with supper., Disp: 90 tablet, Rfl: 3 .  spironolactone (ALDACTONE) 25 MG tablet, Take 0.5 tablets (12.5 mg total) by mouth daily., Disp: 45 tablet, Rfl: 3 .  zolpidem (AMBIEN) 10 MG tablet, Take 10 mg by mouth at bedtime as needed for sleep., Disp: , Rfl:  No Known  Allergies   Social History   Socioeconomic History  . Marital status: Married    Spouse name: Not on file  . Number of children: Not on file  . Years of education: Not on file  . Highest education level: Not on file  Occupational History  . Not on file  Social Needs  . Financial resource strain: Not on file  . Food insecurity:    Worry: Not on file    Inability: Not on file  . Transportation needs:    Medical: Not on file    Non-medical: Not on file  Tobacco Use  . Smoking status: Never Smoker  . Smokeless tobacco: Never Used  Substance and Sexual Activity  . Alcohol use: No  . Drug use: No  . Sexual activity: Yes    Partners: Male    Birth control/protection: Surgical  Lifestyle  . Physical activity:    Days per week: Not on file    Minutes per session: Not on file  . Stress: Not on file  Relationships  . Social connections:    Talks on phone: Not on file    Gets together: Not on file    Attends religious service: Not on file    Active member of club or organization: Not on file    Attends meetings of clubs or organizations: Not on file    Relationship status: Not on file  . Intimate partner violence:    Fear of current or ex partner: Not on file    Emotionally abused: Not on file    Physically abused: Not on file    Forced sexual activity: Not on file  Other Topics Concern  . Not on file  Social History Narrative  . Not on file    Physical Exam  Pulmonary/Chest: Effort normal.  Abdominal: Soft.  Musculoskeletal: She exhibits edema.  Skin: Skin is warm and dry. She is not diaphoretic.        Future Appointments  Date Time Provider [place]  05/05/2018 11:30 AM [facility] ROOM [facility]-MD[facility] None  05/05/2018  1:40 PM GI-BCG MM 3 GI-BCGMM GI-BREAST CE  05/13/2018  1:30 PM Leandrew Koyanagi, MD PO-NW None  06/05/2018 11:00 AM [facility] ROOM [facility] None  07/01/2018  1:00 PM MC ECHO 1-BUZZ MC-ECHOLAB [facility]  07/01/2018  2:00 PM Bensimhon, Shaune Pascal, MD MC-HVSC  None  07/08/2018 10:00 AM [facility]-MD[facility] ROOM [facility]-MD[facility] None  07/16/2018  1:30 PM Ladell Pier, MD CHW-CHWW None  08/04/2018 11:00 AM [facility]-MD[facility] ROOM [facility]-MD[facility] None  08/28/2018 11:00 AM [facility] ROOM [facility]-MD[facility] None     Pulse 96   Resp 16   SpO2 98%  BP 110/p    ATF pt CAO x4 coming from her bedroom c/o dizziness. She stated that she has already taking her "dizzy medication earlier today", which is not working and "something is wrong".  Pt began to cry and stated that the dizziness is worrying her.  She stated that she's taken 2.5mg  valium earlier  today (she's prescribed 5 mg valium).  Pt stated that she will take the other 2.5mg  valium when I leave and another pill for the vertigo.  Pt has no other complaint at this time. She stated that took a extra fluid pill this weekend due to weight gain; she can't remember her weight that she took this morning.  Prior to me leaving pt had calmed down.  Pt agrees to [facility] visit next week.    Medication ordered: pt orders her medications  Froylan Hobby, EMT Paramedic [phone] 04/30/2018    ACTION: Home visit completed

## 2018-04-30 NOTE — Telephone Encounter (Signed)
Pt called and notified that I was running behind, therefore I pushed our CHP visit to later today. Pt agrees

## 2018-05-05 ENCOUNTER — Ambulatory Visit
Admission: RE | Admit: 2018-05-05 | Discharge: 2018-05-05 | Disposition: A | Discharge status: Home / Self Care | Payer: Medicare HMO | Source: Ambulatory Visit | Attending: Internal Medicine | Admitting: Internal Medicine

## 2018-05-05 ENCOUNTER — Encounter (HOSPITAL_COMMUNITY)
Admission: RE | Admit: 2018-05-05 | Discharge: 2018-05-05 | Disposition: A | Discharge status: Home / Self Care | Payer: Medicare HMO | Source: Ambulatory Visit | Attending: Internal Medicine | Admitting: Internal Medicine

## 2018-05-05 DIAGNOSIS — I5022 Chronic systolic (congestive) heart failure: Secondary | ICD-10-CM | POA: Diagnosis not present

## 2018-05-05 DIAGNOSIS — Z452 Encounter for adjustment and management of vascular access device: Secondary | ICD-10-CM | POA: Diagnosis not present

## 2018-05-05 DIAGNOSIS — I2782 Chronic pulmonary embolism: Secondary | ICD-10-CM | POA: Diagnosis not present

## 2018-05-05 DIAGNOSIS — Z1231 Encounter for screening mammogram for malignant neoplasm of breast: Secondary | ICD-10-CM | POA: Diagnosis not present

## 2018-05-05 MED ORDER — SODIUM CHLORIDE 0.9% FLUSH
10.0000 mL | INTRAVENOUS | Status: DC
  Administered 2018-05-05: 10 mL via INTRAVENOUS
  Filled 2018-05-05: qty 10

## 2018-05-05 MED ORDER — HEPARIN SOD (PORK) LOCK FLUSH 100 UNIT/ML IV SOLN
500.0000 [IU] | INTRAVENOUS | Status: DC
  Administered 2018-05-05: 500 [IU] via INTRAVENOUS
  Filled 2018-05-05: qty 5

## 2018-05-06 ENCOUNTER — Ambulatory Visit: Payer: Self-pay

## 2018-05-12 ENCOUNTER — Ambulatory Visit (INDEPENDENT_AMBULATORY_CARE_PROVIDER_SITE_OTHER): Payer: Medicare HMO | Admitting: Orthopaedic Surgery

## 2018-05-12 ENCOUNTER — Ambulatory Visit (INDEPENDENT_AMBULATORY_CARE_PROVIDER_SITE_OTHER): Payer: Self-pay

## 2018-05-12 ENCOUNTER — Encounter (INDEPENDENT_AMBULATORY_CARE_PROVIDER_SITE_OTHER): Payer: Self-pay | Admitting: Orthopaedic Surgery

## 2018-05-12 VITALS — Ht 63.0 in | Wt 334.0 lb

## 2018-05-12 DIAGNOSIS — M25562 Pain in left knee: Secondary | ICD-10-CM | POA: Diagnosis not present

## 2018-05-12 DIAGNOSIS — M1712 Unilateral primary osteoarthritis, left knee: Secondary | ICD-10-CM

## 2018-05-12 DIAGNOSIS — M79672 Pain in left foot: Secondary | ICD-10-CM

## 2018-05-12 DIAGNOSIS — M1711 Unilateral primary osteoarthritis, right knee: Secondary | ICD-10-CM

## 2018-05-12 DIAGNOSIS — M722 Plantar fascial fibromatosis: Secondary | ICD-10-CM

## 2018-05-12 DIAGNOSIS — M25561 Pain in right knee: Secondary | ICD-10-CM

## 2018-05-12 DIAGNOSIS — Z6841 Body Mass Index (BMI) 40.0 and over, adult: Secondary | ICD-10-CM

## 2018-05-12 DIAGNOSIS — M170 Bilateral primary osteoarthritis of knee: Secondary | ICD-10-CM

## 2018-05-12 MED ORDER — METHYLPREDNISOLONE ACETATE 40 MG/ML IJ SUSP
40.0000 mg | INTRAMUSCULAR | Status: AC | PRN
  Administered 2018-05-12: 40 mg

## 2018-05-12 MED ORDER — LIDOCAINE HCL 1 % IJ SOLN
1.0000 mL | INTRAMUSCULAR | Status: AC | PRN
  Administered 2018-05-12: 1 mL

## 2018-05-12 MED ORDER — LIDOCAINE HCL 1 % IJ SOLN
2.0000 mL | INTRAMUSCULAR | Status: AC | PRN
  Administered 2018-05-12: 2 mL

## 2018-05-12 MED ORDER — METHYLPREDNISOLONE ACETATE 40 MG/ML IJ SUSP
40.0000 mg | INTRAMUSCULAR | Status: AC | PRN
  Administered 2018-05-12: 40 mg via INTRA_ARTICULAR

## 2018-05-12 MED ORDER — BUPIVACAINE HCL 0.5 % IJ SOLN
1.0000 mL | INTRAMUSCULAR | Status: AC | PRN
  Administered 2018-05-12: 1 mL

## 2018-05-12 MED ORDER — BUPIVACAINE HCL 0.5 % IJ SOLN
2.0000 mL | INTRAMUSCULAR | Status: AC | PRN
  Administered 2018-05-12: 2 mL via INTRA_ARTICULAR

## 2018-05-12 NOTE — Progress Notes (Signed)
Office Visit Note   Patient: [name] [name]           Date of Birth: 03/30/1961           MRN: [id number] Visit Date: 05/12/2018              Requested by: Ladell Pier, MD [address] [place], [place] [address] PCP: Ladell Pier, MD   Assessment & Plan: Visit Diagnoses:  1. Bilateral primary osteoarthritis of knee   2. Plantar fasciitis of left foot   3. Morbid obesity ([place])   4. Body mass index 50.0-59.9, adult ([place])     Plan: 1) bilateral knee pain secondary to osteoarthritis.  X-rays obtained today show tricompartment degenerative changes.  She has gone through multiple interventions to include several rounds of steroid injections as well as arthroscopic debridement of both knees.  At this time patient is not a candidate for total knee replacement due to her BMI of 59.2. Steroid injection performed today for the left knee which she tolerated well.  Follow-up as needed.  She will make all efforts to lose weight.  2) left foot pain secondary to likely plantar fasciitis.  Steroid injection performed today.  Follow-up in 6 weeks  Follow-Up Instructions: Return in about 2 weeks (around 05/26/2018).   Orders:  Orders Placed This Encounter  Procedures  . XR KNEE 3 VIEW RIGHT  . XR KNEE 3 VIEW LEFT  . XR Foot Complete Left   No orders of the defined types were placed in this encounter.     Procedures: Large Joint Inj: L knee on 05/12/2018 11:28 AM Details: 22 G needle Medications: 2 mL bupivacaine 0.5 %; 2 mL lidocaine 1 %; 40 mg methylPREDNISolone acetate 40 MG/ML Outcome: tolerated well, no immediate complications Patient was prepped and draped in the usual sterile fashion.   Foot Inj Date/Time: 05/12/2018 11:28 AM Performed by: Leandrew Koyanagi, MD Authorized by: Leandrew Koyanagi, MD   Consent Given by:  Patient Timeout: prior to procedure the correct patient, procedure, and site was verified   Indications:  Pain Condition: Plantar Fasciitis   Location:  left plantar fascia muscle   Prep: patient was prepped and draped in usual sterile fashion   Needle Size:  25 G Medications:  1 mL lidocaine 1 %; 1 mL bupivacaine 0.5 %; 40 mg methylPREDNISolone acetate 40 MG/ML     Clinical Data: No additional findings.   Subjective: Chief Complaint  Patient presents with  . Left Knee - Pain  . Right Knee - Pain    HPI [name] [name] is a 57 year old female who presents with 2 issues:  1.  Bilateral knee pain for about 8 years.  Left is worse than right.  She reports fairly constant pain which can be as bad as 10/10 with activity.  Over the past several years she has had multiple interventions to include steroid injections which were minimally helpful.  In 2014 she had arthroscopic surgery on her left knee followed by arthroscopic surgery in the right knee in 2015.  Overall she feels her pain continues to worsen and now has to ambulate with a cane.  She denies any noticeable swelling, erythema, bruising.  She denies any specific injuries to the knees.  She denies any catching/locking.  She does note occasional giving way of the left knee due to pain.   2.  Left heel pain that has been intermittent for the past 12 years.  Currently has worsened over the last  3 weeks.  Her pain is constant throughout the day and located over the pad of the heel.  She denies any numbness or tingling.  She denies any specific injury.  She denies any associated skin changes.  She has taken Tylenol with codeine for pain.  In the past she has had steroid injections done for treatment of this.  She has never tried any other interventions per  Review of Systems see HPI   Objective: Vital Signs: Ht 5\' 3"  (1.6 m)   Wt (!) 334 lb (151.5 kg)   BMI 59.17 kg/m   Physical Exam  GEN: Awake, alert, no acute distress Pulmonary: Breathing unlabored  Ortho Exam   Bilateral knees: - Inspection: no gross deformity. No swelling/effusion, erythema or bruising. Skin intact - Palpation:  Tender to palpation over the medial lateral joint lines.  Tenderness over the patella - ROM: full active ROM with flexion and extension in knee  - Strength: full strength on resisted flexion and extension - Neuro/vasc: Normal sensation - Special Tests: - LIGAMENTS: negative anterior and posterior drawer, no MCL or LCL laxity  -- MENISCUS: negative McMurray's,   Hips: No pain with passive internal or external rotation/negative logroll   Specialty Comments:  No specialty comments available.  Imaging: Xr Foot Complete Left  Result Date: 05/12/2018 No acute or structural abnormalities  Xr Knee 3 View Left  Result Date: 05/12/2018 Significant joint space narrowing consistent with tricompartmental degenerative joint disease  Xr Knee 3 View Right  Result Date: 05/12/2018 Significant joint space narrowing consistent with tricompartmental degenerative joint disease    PMFS History: Patient Active Problem List   Diagnosis Date Noted  . Bilateral primary osteoarthritis of knee 05/12/2018  . Plantar fasciitis of left foot 05/12/2018  . Shortness of breath 01/16/2018  . Palliative care patient 01/16/2018  . Major depressive disorder, recurrent severe without psychotic features ([place]) 12/06/2017  . CHF exacerbation ([place]) 08/21/2017  . (HFpEF) heart failure with preserved ejection fraction ([place]) 08/20/2017  . Lumbar radiculopathy 08/08/2017  . Body mass index 50.0-59.9, adult ([place]) 08/08/2017  . Hoarseness 08/08/2017  . Chronic cough 04/10/2017  . Rheumatoid arthritis involving multiple sites ([place]) 04/10/2017  . Anxiety and depression 04/10/2017  . Adjustment disorder with depressed mood 12/11/2016  . Morbid obesity ([place]) 08/14/2016  . Stroke ([place]) 06/27/2016  . Right sided weakness 06/27/2016  . Essential hypertension 06/27/2016  . Atypical chest pain 06/27/2016  . Difficult airway 03/29/2016  . CTEPH (chronic thromboembolic pulmonary hypertension) ([place]) 03/14/2016  . OSA on CPAP  02/20/2016  . Chronic pain syndrome 01/31/2016  . Pulmonary embolus ([place]) 11/15/2015  . Vertigo 11/15/2015  . Depression 11/15/2015   Past Medical History:  Diagnosis Date  . Arthritis   . CHF exacerbation ([place]) 08/21/2017  . Depression   . Diverticulitis   . Hyperlipidemia   . Hypertension   . Obesity   . Pneumonia   . Pulmonary embolism ([facility])     Family History  Problem Relation Age of Onset  . Lung cancer Maternal Grandmother   . Dementia Mother   . Dementia Father   . Anxiety disorder Sister   . Bipolar disorder Sister   . Drug abuse Sister   . Sexual abuse Maternal Aunt   . Anxiety disorder Cousin   . Anxiety disorder Sister   . Bipolar disorder Sister   . Drug abuse Sister   . Breast cancer Neg Hx     Past Surgical History:  Procedure Laterality Date  .  ABDOMINAL HYSTERECTOMY    . BREAST REDUCTION SURGERY    . CHOLECYSTECTOMY    . EMBOLECTOMY N/A    pulmonary embolectomy  . REDUCTION MAMMAPLASTY Bilateral   . RIGHT HEART CATH N/A 10/28/2016   Procedure: Right Heart Cath;  Surgeon: Jolaine Artist, MD;  Location: [facility];  Service: Cardiovascular;  Laterality: N/A;  . RIGHT HEART CATH N/A 08/26/2017   Procedure: RIGHT HEART CATH;  Surgeon: Jolaine Artist, MD;  Location: [facility];  Service: Cardiovascular;  Laterality: N/A;  . ULTRASOUND GUIDANCE FOR VASCULAR ACCESS  08/26/2017   Procedure: Ultrasound Guidance For Vascular Access;  Surgeon: Jolaine Artist, MD;  Location: [facility];  Service: Cardiovascular;;   Social History   Occupational History  . Not on file  Tobacco Use  . Smoking status: Never Smoker  . Smokeless tobacco: Never Used  Substance and Sexual Activity  . Alcohol use: No  . Drug use: No  . Sexual activity: Yes    Partners: Male    Birth control/protection: Surgical

## 2018-05-13 ENCOUNTER — Ambulatory Visit (INDEPENDENT_AMBULATORY_CARE_PROVIDER_SITE_OTHER): Payer: Self-pay | Admitting: Orthopaedic Surgery

## 2018-05-14 ENCOUNTER — Telehealth: Payer: Self-pay | Admitting: Internal Medicine

## 2018-05-14 DIAGNOSIS — M5442 Lumbago with sciatica, left side: Secondary | ICD-10-CM

## 2018-05-14 DIAGNOSIS — M5416 Radiculopathy, lumbar region: Secondary | ICD-10-CM

## 2018-05-14 MED ORDER — CYCLOBENZAPRINE HCL 5 MG PO TABS: ORAL_TABLET | ORAL | 2 refills | Status: DC

## 2018-05-14 MED ORDER — ACETAMINOPHEN-CODEINE #4 300-60 MG PO TABS: 1.0000 | ORAL_TABLET | Freq: Three times a day (TID) | ORAL | 0 refills | Status: DC | PRN

## 2018-05-14 NOTE — Telephone Encounter (Signed)
Contacted pt and lvm informing her that tyelonol 4 rx is ready for pickup

## 2018-05-14 NOTE — Telephone Encounter (Signed)
acetaminophen-codeine (TYLENOL #4) 300-60 MG tablet [[id number]]  cyclobenzaprine (FLEXERIL) 5 MG tablet [[id number]]     Patient would like refills sent to [facility] on [address]. Please follow up with patient.

## 2018-05-14 NOTE — Telephone Encounter (Signed)
Patient requested refill on Tylenol No. 4's.  NCCSRS reviewed and is appropriate.  Will give 2 rxns #90 each.  One to fill now and one to fill 06/17/2018.

## 2018-05-14 NOTE — Telephone Encounter (Signed)
Dr. [name] pt is requesting a refill on medication but I am not for sure if pt is suppose to continue taking medication. If appropriate pt is wanting rx to be sent to [facility] on [address]

## 2018-05-14 NOTE — Telephone Encounter (Signed)
Patient called about cyclobenzaprine (FLEXERIL) 5 MG tablet [[id number]]

## 2018-05-16 DIAGNOSIS — I272 Pulmonary hypertension, unspecified: Secondary | ICD-10-CM | POA: Diagnosis not present

## 2018-05-16 DIAGNOSIS — I2724 Chronic thromboembolic pulmonary hypertension: Secondary | ICD-10-CM | POA: Diagnosis not present

## 2018-05-16 DIAGNOSIS — R0602 Shortness of breath: Secondary | ICD-10-CM | POA: Diagnosis not present

## 2018-05-16 DIAGNOSIS — G4733 Obstructive sleep apnea (adult) (pediatric): Secondary | ICD-10-CM | POA: Diagnosis not present

## 2018-05-26 ENCOUNTER — Telehealth (HOSPITAL_COMMUNITY): Payer: Self-pay

## 2018-05-26 NOTE — Telephone Encounter (Signed)
Pt called to schedule [facility] visit; pt agrees to Friday at 1

## 2018-05-29 ENCOUNTER — Encounter (HOSPITAL_COMMUNITY): Payer: Self-pay

## 2018-05-29 ENCOUNTER — Other Ambulatory Visit (HOSPITAL_COMMUNITY): Payer: Self-pay

## 2018-05-29 NOTE — Progress Notes (Signed)
Paramedicine Encounter    Patient ID: [name] [name], female    DOB: 12-05-1960, 57 y.o.   MRN: [id number]    Patient Care Team: Ladell Pier, MD as PCP - General (Internal Medicine)  Patient Active Problem List   Diagnosis Date Noted  . Bilateral primary osteoarthritis of knee 05/12/2018  . Plantar fasciitis of left foot 05/12/2018  . Shortness of breath 01/16/2018  . Palliative care patient 01/16/2018  . Major depressive disorder, recurrent severe without psychotic features ([place]) 12/06/2017  . CHF exacerbation ([place]) 08/21/2017  . (HFpEF) heart failure with preserved ejection fraction ([place]) 08/20/2017  . Lumbar radiculopathy 08/08/2017  . Body mass index 50.0-59.9, adult ([place]) 08/08/2017  . Hoarseness 08/08/2017  . Chronic cough 04/10/2017  . Rheumatoid arthritis involving multiple sites ([place]) 04/10/2017  . Anxiety and depression 04/10/2017  . Adjustment disorder with depressed mood 12/11/2016  . Morbid obesity ([place]) 08/14/2016  . Stroke ([place]) 06/27/2016  . Right sided weakness 06/27/2016  . Essential hypertension 06/27/2016  . Atypical chest pain 06/27/2016  . Difficult airway 03/29/2016  . CTEPH (chronic thromboembolic pulmonary hypertension) ([place]) 03/14/2016  . OSA on CPAP 02/20/2016  . Chronic pain syndrome 01/31/2016  . Pulmonary embolus ([place]) 11/15/2015  . Vertigo 11/15/2015  . Depression 11/15/2015    Current Outpatient Medications:  .  acetaminophen-codeine (TYLENOL #4) 300-60 MG tablet, Take 1 tablet by mouth every 8 (eight) hours as needed for moderate pain., Disp: 90 tablet, Rfl: 0 .  acetaminophen-codeine (TYLENOL #4) 300-60 MG tablet, Take 1 tablet by mouth every 8 (eight) hours as needed for moderate pain. To fill on or after 06/13/2018., Disp: 90 tablet, Rfl: 0 .  albuterol (PROVENTIL HFA;VENTOLIN HFA) 108 (90 Base) MCG/ACT inhaler, Inhale 1-2 puffs into the lungs every 6 (six) hours as needed for wheezing., Disp: 3 Inhaler, Rfl: 6 .  ALPRAZolam  (XANAX) 1 MG tablet, Take 1 mg by mouth as needed for anxiety., Disp: , Rfl:  .  atorvastatin (LIPITOR) 20 MG tablet, Take 1 tablet (20 mg total) by mouth daily at 6 PM., Disp: 90 tablet, Rfl: 3 .  benzonatate (TESSALON) 100 MG capsule, Take 1 capsule (100 mg total) by mouth every 8 (eight) hours., Disp: 21 capsule, Rfl: 0 .  clotrimazole-betamethasone (LOTRISONE) cream, Apply 1 application topically 2 (two) times daily., Disp: 30 g, Rfl: 0 .  cyclobenzaprine (FLEXERIL) 5 MG tablet, TAKE 1 TABLET BY MOUTH TWICE DAILY AS NEEDED FOR MUSCLE SPASM, Disp: 40 tablet, Rfl: 2 .  docusate sodium (COLACE) 100 MG capsule, Take 1 capsule (100 mg total) by mouth every 12 (twelve) hours., Disp: 30 capsule, Rfl: 0 .  ferrous sulfate (FERROUSUL) 325 (65 FE) MG tablet, Take 1 tablet (325 mg total) by mouth daily with breakfast., Disp: 90 tablet, Rfl: 3 .  fluticasone (FLONASE) 50 MCG/ACT nasal spray, Place 2 sprays into both nostrils daily. (Patient taking differently: Place 2 sprays into both nostrils daily as needed for allergies. ), Disp: 16 g, Rfl: 6 .  furosemide (LASIX) 40 MG tablet, Take 1 tablet (40 mg total) by mouth daily. Take extra 40 mg tablet once in the afternoon AS NEEDED for weight gain 3 lbs or more., Disp: 180 tablet, Rfl: 3 .  gabapentin (NEURONTIN) 300 MG capsule, Take 2 capsules (600 mg total) by mouth 3 (three) times daily., Disp: 540 capsule, Rfl: 3 .  loratadine (CLARITIN) 10 MG tablet, Take 1 tablet (10 mg total) by mouth daily., Disp: 90 tablet, Rfl: 2 .  meclizine (  ANTIVERT) 25 MG tablet, TAKE ONE TABLET BY MOUTH THREE TIMES DAILY AS NEEDED FOR  DIZZINESS, Disp: 60 tablet, Rfl: 0 .  omeprazole (PRILOSEC) 40 MG capsule, Take 1 capsule (40 mg total) by mouth 2 (two) times daily., Disp: 180 capsule, Rfl: 3 .  Riociguat (ADEMPAS) 1.5 MG TABS, Take 1.5 mg by mouth 3 (three) times daily., Disp: 90 tablet, Rfl: 11 .  rivaroxaban (XARELTO) 20 MG TABS tablet, Take 1 tablet (20 mg total) by mouth  daily with supper., Disp: 90 tablet, Rfl: 3 .  spironolactone (ALDACTONE) 25 MG tablet, Take 0.5 tablets (12.5 mg total) by mouth daily., Disp: 45 tablet, Rfl: 3 .  zolpidem (AMBIEN) 10 MG tablet, Take 10 mg by mouth at bedtime as needed for sleep., Disp: , Rfl:  No Known Allergies   Social History   Socioeconomic History  . Marital status: Married    Spouse name: Not on file  . Number of children: Not on file  . Years of education: Not on file  . Highest education level: Not on file  Occupational History  . Not on file  Social Needs  . Financial resource strain: Not on file  . Food insecurity:    Worry: Not on file    Inability: Not on file  . Transportation needs:    Medical: Not on file    Non-medical: Not on file  Tobacco Use  . Smoking status: Never Smoker  . Smokeless tobacco: Never Used  Substance and Sexual Activity  . Alcohol use: No  . Drug use: No  . Sexual activity: Yes    Partners: Male    Birth control/protection: Surgical  Lifestyle  . Physical activity:    Days per week: Not on file    Minutes per session: Not on file  . Stress: Not on file  Relationships  . Social connections:    Talks on phone: Not on file    Gets together: Not on file    Attends religious service: Not on file    Active member of club or organization: Not on file    Attends meetings of clubs or organizations: Not on file    Relationship status: Not on file  . Intimate partner violence:    Fear of current or ex partner: Not on file    Emotionally abused: Not on file    Physically abused: Not on file    Forced sexual activity: Not on file  Other Topics Concern  . Not on file  Social History Narrative  . Not on file    Physical Exam      Future Appointments  Date Time Provider [facility]  06/05/2018 11:00 AM [facility] ROOM [facility] None  06/11/2018  9:00 AM Leandrew Koyanagi, MD PO-NW None  06/29/2018  1:30 PM Ladell Pier, MD CHW-CHWW None  07/01/2018  1:00 PM MC  ECHO 1-BUZZ MC-ECHOLAB [facility]  07/01/2018  2:00 PM Bensimhon, Shaune Pascal, MD MC-HVSC None  07/08/2018 10:00 AM WL-MDCC ROOM [facility] None  08/04/2018 11:00 AM [facility] ROOM [facility] None  08/28/2018 11:00 AM [facility] ROOM [facility]-MD[facility] None     Wt (!) 333 lb (151 kg)   BMI 58.99 kg/m   106/p   ATF pt CAO x4 sitting in wheelchair with no complaints.  She stated that she's out of blood thinners since yesterday.  I called the heart failure clinic for samples.  Pt stated that she can't afford her co-pay for this month.  Pt stated that she's taking  all of her medications including the pshy medications.  She stated that she's still going to the psychiartist but she's worried about the co-pay for that next next.    Pt stated that she's not taking the xanax; the medication is working but she doesn't want to become dependant on it.  She stated that she's started going to [facility] to start the bariatric treatment.  Her goal is to loose 10 lbs a month. She's walking more without the walker.  Rx bottles verified.     Medication ordered: xalreto from the clinic  [place], EMT Paramedic [phone] 05/29/2018    ACTION: Home visit completed

## 2018-06-03 ENCOUNTER — Ambulatory Visit (HOSPITAL_BASED_OUTPATIENT_CLINIC_OR_DEPARTMENT_OTHER): Payer: Medicare HMO | Admitting: Physician Assistant

## 2018-06-03 ENCOUNTER — Ambulatory Visit (HOSPITAL_COMMUNITY)
Admission: RE | Admit: 2018-06-03 | Discharge: 2018-06-03 | Disposition: A | Discharge status: Home / Self Care | Payer: Medicare HMO | Source: Ambulatory Visit | Attending: Physician Assistant | Admitting: Physician Assistant

## 2018-06-03 ENCOUNTER — Telehealth: Payer: Self-pay

## 2018-06-03 VITALS — BP 119/73 | HR 86 | Temp 98.0°F | Resp 18 | Ht 65.0 in | Wt 340.0 lb

## 2018-06-03 DIAGNOSIS — R1012 Left upper quadrant pain: Secondary | ICD-10-CM | POA: Insufficient documentation

## 2018-06-03 DIAGNOSIS — R05 Cough: Secondary | ICD-10-CM

## 2018-06-03 DIAGNOSIS — J984 Other disorders of lung: Secondary | ICD-10-CM | POA: Diagnosis not present

## 2018-06-03 DIAGNOSIS — R059 Cough, unspecified: Secondary | ICD-10-CM

## 2018-06-03 DIAGNOSIS — R109 Unspecified abdominal pain: Secondary | ICD-10-CM | POA: Diagnosis not present

## 2018-06-03 MED ORDER — POLYETHYLENE GLYCOL 3350 17 GM/SCOOP PO POWD: 17.0000 g | Freq: Two times a day (BID) | ORAL | 1 refills | Status: DC | PRN

## 2018-06-03 NOTE — Progress Notes (Signed)
Patient ID: [name] [name], female   DOB: [birth year], 57 y.o.   MRN: [id number]     [name] [name], is a 57 y.o. female  POE:[id number]  [id number]  DOB - Jul 16, 1961  Subjective:  Chief Complaint and HPI: [name] [name] is a 57 y.o. female here today for LUQ pain.  L sided abdominal pain worsening over 2-3 weeks.  Pain worse if she bends forward.  Feels tight.  Comes and goes. Also hurts to reach around wipe after BM.  No changes in BM-has some loose stools and some constipation.  No melena/hematochezia. No fever/chills.  No change in appetite.  Pain moderate to severe when it cramps then mild when sore afterwards.   Feels sore in between cramping  Also having a cough for about 6 weeks. Non-productive. No CP.  No new SOB.   Uses her O2 on and off(doesn't have it on here).    Social:  Married  ROS:   Constitutional:  No f/c, No night sweats, No unexplained weight loss. EENT:  No vision changes, No blurry vision, No hearing changes. No mouth, throat, or ear problems.  Respiratory: No cough, some SOB Cardiac: No CP, no palpitations GI:  + abd pain, No N/V/D. GU: No Urinary s/sx Musculoskeletal: No joint pain Neuro: No headache, no dizziness, no motor weakness.  Skin: No rash Endocrine:  No polydipsia. No polyuria.  Psych: Denies SI/HI  No problems updated.  ALLERGIES: No Known Allergies  PAST MEDICAL HISTORY: Past Medical History:  Diagnosis Date  . Arthritis   . CHF exacerbation ([place]) 08/21/2017  . Depression   . Diverticulitis   . Hyperlipidemia   . Hypertension   . Obesity   . Pneumonia   . Pulmonary embolism ([place])     MEDICATIONS AT HOME: Prior to Admission medications   Medication Sig Start Date End Date Taking? Authorizing Provider  acetaminophen-codeine (TYLENOL #4) 300-60 MG tablet Take 1 tablet by mouth every 8 (eight) hours as needed for moderate pain. 05/14/18  Yes Ladell Pier, MD  albuterol (PROVENTIL HFA;VENTOLIN HFA) 108 (90 Base) MCG/ACT  inhaler Inhale 1-2 puffs into the lungs every 6 (six) hours as needed for wheezing. 10/17/17  Yes Ladell Pier, MD  ALPRAZolam Duanne Moron) 1 MG tablet Take 1 mg by mouth as needed for anxiety.   Yes [provider]  atorvastatin (LIPITOR) 20 MG tablet Take 1 tablet (20 mg total) by mouth daily at 6 PM. 10/17/17  Yes Ladell Pier, MD  cyclobenzaprine (FLEXERIL) 5 MG tablet TAKE 1 TABLET BY MOUTH TWICE DAILY AS NEEDED FOR MUSCLE SPASM 05/14/18  Yes Ladell Pier, MD  docusate sodium (COLACE) 100 MG capsule Take 1 capsule (100 mg total) by mouth every 12 (twelve) hours. 10/24/16  Yes Margarita Mail, PA-C  ferrous sulfate (FERROUSUL) 325 (65 FE) MG tablet Take 1 tablet (325 mg total) by mouth daily with breakfast. 12/25/16  Yes Langeland, Dawn T, MD  fluticasone (FLONASE) 50 MCG/ACT nasal spray Place 2 sprays into both nostrils daily. Patient taking differently: Place 2 sprays into both nostrils daily as needed for allergies.  02/04/17  Yes Langeland, Dawn T, MD  furosemide (LASIX) 40 MG tablet Take 1 tablet (40 mg total) by mouth daily. Take extra 40 mg tablet once in the afternoon AS NEEDED for weight gain 3 lbs or more. 10/17/17  Yes Ladell Pier, MD  gabapentin (NEURONTIN) 300 MG capsule Take 2 capsules (600 mg total) by mouth 3 (three) times daily. 10/17/17  Yes  Ladell Pier, MD  loratadine (CLARITIN) 10 MG tablet Take 1 tablet (10 mg total) by mouth daily. 10/29/17  Yes Ladell Pier, MD  meclizine (ANTIVERT) 25 MG tablet TAKE ONE TABLET BY MOUTH THREE TIMES DAILY AS NEEDED FOR  DIZZINESS 03/30/18  Yes Bensimhon, Shaune Pascal, MD  omeprazole (PRILOSEC) 40 MG capsule Take 1 capsule (40 mg total) by mouth 2 (two) times daily. 10/17/17  Yes Ladell Pier, MD  rivaroxaban (XARELTO) 20 MG TABS tablet Take 1 tablet (20 mg total) by mouth daily with supper. 02/11/18  Yes Ladell Pier, MD  spironolactone (ALDACTONE) 25 MG tablet Take 0.5 tablets (12.5 mg total) by mouth daily.  02/17/18  Yes Bensimhon, Shaune Pascal, MD  zolpidem (AMBIEN) 10 MG tablet Take 10 mg by mouth at bedtime as needed for sleep.   Yes [provider]  clotrimazole-betamethasone (LOTRISONE) cream Apply 1 application topically 2 (two) times daily. 01/08/18   Ladell Pier, MD  Riociguat (ADEMPAS) 1.5 MG TABS Take 1.5 mg by mouth 3 (three) times daily. 03/30/18   Bensimhon, Shaune Pascal, MD     Objective:  EXAM:   Vitals:   06/03/18 0950  BP: 119/73  Pulse: 86  Resp: 18  Temp: 98 F (36.7 C)  TempSrc: Oral  SpO2: 96%  Weight: (!) 340 lb (154.2 kg)  Height: 5\' 5"  (1.651 m)    General appearance : A&OX3. NAD. Non-toxic-appearing, morbidly obese HEENT: Atraumatic and Normocephalic.  PERRLA. EOM intact.  Mouth-MMM, post pharynx WNL w/o erythema, No PND. Neck: supple, no JVD. No cervical lymphadenopathy. No thyromegaly Chest/Lungs:  Breathing-non-labored, Good air entry bilaterally, breath sounds normal without rales, rhonchi, or wheezing  CVS: S1 S2 regular, no murmurs, gallops, rubs  Abdomen: obese; Bowel sounds present, Non tender and not distended with no gaurding, rigidity or rebound. Extremities: Bilateral Lower Ext shows mild edema, both legs are warm to touch with = pulse throughout Neurology:  CN II-XII grossly intact, Non focal.   Psych:  TP linear. J/I WNL. Normal speech. Appropriate eye contact and affect.  Skin:  No Rash  Data Review Lab Results  Component Value Date   HGBA1C 5.6 06/27/2016     Assessment & Plan   1. LUQ pain No red flags/non-acute abdomen.  ?constipation related to opiate use? - DG Abd 2 Views; Future -she is having a port place Friday of this week-I gave her written orders for CBC w/ diff, CMP, and Lipase.  She agrees to go to the ED if abdominal pain worsens/fever develops.   2. Cough No rales/rhonchi - DG Chest 1 View; Future   Patient have been counseled extensively about nutrition and exercise  Return for keep appt with Dr [name]  for October.  The patient was given clear instructions to go to ER or return to medical center if symptoms don't improve, worsen or new problems develop. The patient verbalized understanding. The patient was told to call to get lab results if they haven't heard anything in the next week.     Freeman Caldron, PA-C [facility] [place], [place]   06/03/2018, 10:12 AM

## 2018-06-03 NOTE — Telephone Encounter (Signed)
As per Abelino Derrick, [employer] [place], they have closed the patient's case.

## 2018-06-03 NOTE — Patient Instructions (Signed)

## 2018-06-05 ENCOUNTER — Telehealth: Payer: Self-pay | Admitting: *Deleted

## 2018-06-05 ENCOUNTER — Telehealth: Payer: Self-pay | Admitting: Internal Medicine

## 2018-06-05 ENCOUNTER — Encounter (HOSPITAL_COMMUNITY)
Admission: RE | Admit: 2018-06-05 | Discharge: 2018-06-05 | Disposition: A | Discharge status: Home / Self Care | Payer: Medicare HMO | Source: Ambulatory Visit | Attending: Internal Medicine | Admitting: Internal Medicine

## 2018-06-05 DIAGNOSIS — Z452 Encounter for adjustment and management of vascular access device: Secondary | ICD-10-CM | POA: Insufficient documentation

## 2018-06-05 DIAGNOSIS — I2782 Chronic pulmonary embolism: Secondary | ICD-10-CM | POA: Insufficient documentation

## 2018-06-05 DIAGNOSIS — I5022 Chronic systolic (congestive) heart failure: Secondary | ICD-10-CM | POA: Insufficient documentation

## 2018-06-05 LAB — COMPREHENSIVE METABOLIC PANEL
ALT: 12 U/L (ref 0–44)
AST: 17 U/L (ref 15–41)
Albumin: 3.6 g/dL (ref 3.5–5.0)
Alkaline Phosphatase: 80 U/L (ref 38–126)
Anion gap: 9 (ref 5–15)
BUN: 14 mg/dL (ref 6–20)
CO2: 27 mmol/L (ref 22–32)
Calcium: 9.2 mg/dL (ref 8.9–10.3)
Chloride: 108 mmol/L (ref 98–111)
Creatinine, Ser: 1.08 mg/dL — ABNORMAL HIGH (ref 0.44–1.00)
GFR calc Af Amer: >60 mL/min (ref >60)
GFR calc non Af Amer: 57 mL/min — ABNORMAL LOW (ref >60)
Glucose, Bld: 127 mg/dL — ABNORMAL HIGH (ref 70–99)
Potassium: 3.6 mmol/L (ref 3.5–5.1)
Sodium: 144 mmol/L (ref 135–145)
Total Bilirubin: 0.4 mg/dL (ref 0.3–1.2)
Total Protein: 6.9 g/dL (ref 6.5–8.1)

## 2018-06-05 LAB — CBC WITH DIFFERENTIAL/PLATELET
Basophils Absolute: 0 10*3/uL (ref 0.0–0.1)
Basophils Relative: 0 %
Eosinophils Absolute: 0.2 10*3/uL (ref 0.0–0.7)
Eosinophils Relative: 3 %
HCT: 36.5 % (ref 36.0–46.0)
Hemoglobin: 11.7 g/dL — ABNORMAL LOW (ref 12.0–15.0)
Lymphocytes Relative: 34 %
Lymphs Abs: 2 10*3/uL (ref 0.7–4.0)
MCH: 21 pg — ABNORMAL LOW (ref 26.0–34.0)
MCHC: 32.1 g/dL (ref 30.0–36.0)
MCV: 65.6 fL — ABNORMAL LOW (ref 78.0–100.0)
Monocytes Absolute: 0.4 10*3/uL (ref 0.1–1.0)
Monocytes Relative: 6 %
Neutro Abs: 3.2 10*3/uL (ref 1.7–7.7)
Neutrophils Relative %: 57 %
Platelets: 277 10*3/uL (ref 150–400)
RBC: 5.56 MIL/uL — ABNORMAL HIGH (ref 3.87–5.11)
RDW: 18.1 % — ABNORMAL HIGH (ref 11.5–15.5)
WBC: 5.7 10*3/uL (ref 4.0–10.5)

## 2018-06-05 LAB — LIPASE, BLOOD: Lipase: 36 U/L (ref 11–51)

## 2018-06-05 MED ORDER — HEPARIN SOD (PORK) LOCK FLUSH 100 UNIT/ML IV SOLN
500.0000 [IU] | INTRAVENOUS | Status: AC
  Administered 2018-06-05: 500 [IU] via INTRAVENOUS
  Filled 2018-06-05: qty 5

## 2018-06-05 MED ORDER — SODIUM CHLORIDE 0.9% FLUSH
10.0000 mL | INTRAVENOUS | Status: AC
  Administered 2018-06-05: 10 mL via INTRAVENOUS

## 2018-06-05 NOTE — Telephone Encounter (Signed)
-----   Message from Argentina Donovan, [place] sent at 06/03/2018  4:45 PM EDT ----- Your abdominal xrays and chest xrays do not show any abnormality causing the abdominal pain or cough.  Follow-up as planned.  Thanks, Freeman Caldron, PA-C

## 2018-06-05 NOTE — Telephone Encounter (Signed)
[name] from [facility] called to get new orders for port a cath. flush. Please follow up with [facility] from [facility].

## 2018-06-05 NOTE — Telephone Encounter (Signed)
Patient verified DOB Patient is aware of xray showing no abnormalities related to the pain. Patient advised to follow up as planned.  Patient went to have blood work today. No further questions.

## 2018-06-05 NOTE — Telephone Encounter (Signed)
Faxed order to [facility]

## 2018-06-11 ENCOUNTER — Ambulatory Visit (INDEPENDENT_AMBULATORY_CARE_PROVIDER_SITE_OTHER): Payer: Medicare HMO | Admitting: Orthopaedic Surgery

## 2018-06-11 ENCOUNTER — Other Ambulatory Visit (HOSPITAL_COMMUNITY): Payer: Self-pay | Admitting: *Deleted

## 2018-06-15 DIAGNOSIS — R0602 Shortness of breath: Secondary | ICD-10-CM | POA: Diagnosis not present

## 2018-06-15 DIAGNOSIS — I2724 Chronic thromboembolic pulmonary hypertension: Secondary | ICD-10-CM | POA: Diagnosis not present

## 2018-06-15 DIAGNOSIS — G4733 Obstructive sleep apnea (adult) (pediatric): Secondary | ICD-10-CM | POA: Diagnosis not present

## 2018-06-16 DIAGNOSIS — H5213 Myopia, bilateral: Secondary | ICD-10-CM | POA: Diagnosis not present

## 2018-06-29 ENCOUNTER — Ambulatory Visit (HOSPITAL_COMMUNITY)
Admission: RE | Admit: 2018-06-29 | Discharge: 2018-06-29 | Disposition: A | Discharge status: Home / Self Care | Payer: Medicare HMO | Source: Ambulatory Visit | Attending: Internal Medicine | Admitting: Internal Medicine

## 2018-06-29 ENCOUNTER — Ambulatory Visit (HOSPITAL_BASED_OUTPATIENT_CLINIC_OR_DEPARTMENT_OTHER): Payer: Medicare HMO | Admitting: Internal Medicine

## 2018-06-29 ENCOUNTER — Encounter

## 2018-06-29 ENCOUNTER — Encounter: Payer: Self-pay | Admitting: Internal Medicine

## 2018-06-29 VITALS — BP 133/82 | HR 91 | Temp 98.1°F | Resp 16 | Wt 341.6 lb

## 2018-06-29 DIAGNOSIS — I2721 Secondary pulmonary arterial hypertension: Secondary | ICD-10-CM | POA: Diagnosis not present

## 2018-06-29 DIAGNOSIS — M79662 Pain in left lower leg: Secondary | ICD-10-CM | POA: Insufficient documentation

## 2018-06-29 DIAGNOSIS — R600 Localized edema: Secondary | ICD-10-CM

## 2018-06-29 DIAGNOSIS — Z86711 Personal history of pulmonary embolism: Secondary | ICD-10-CM | POA: Insufficient documentation

## 2018-06-29 DIAGNOSIS — M7989 Other specified soft tissue disorders: Secondary | ICD-10-CM | POA: Diagnosis not present

## 2018-06-29 DIAGNOSIS — M5416 Radiculopathy, lumbar region: Secondary | ICD-10-CM

## 2018-06-29 DIAGNOSIS — F332 Major depressive disorder, recurrent severe without psychotic features: Secondary | ICD-10-CM | POA: Diagnosis not present

## 2018-06-29 MED ORDER — ACETAMINOPHEN-CODEINE #4 300-60 MG PO TABS: 1.0000 | ORAL_TABLET | Freq: Three times a day (TID) | ORAL | 0 refills | Status: DC | PRN

## 2018-06-29 MED ORDER — MECLIZINE HCL 25 MG PO TABS: 25.0000 mg | ORAL_TABLET | Freq: Two times a day (BID) | ORAL | 0 refills | Status: DC | PRN

## 2018-06-29 NOTE — Progress Notes (Signed)
Pt states her fluid pills are not working  Pt states the pain is unbearable   Pt states yesterday there leg was hot to touch  Pt states she is not urinating  Pt states she is scared   Pt states she takes 2 fluid pills and she is not urinating pt states this is the same time when she was admitted in the hospital

## 2018-06-29 NOTE — Progress Notes (Signed)
Patient ID: [name] [name], female    DOB: 1961/02/25  MRN: [id number]  CC: No chief complaint on file.   Subjective: [name] [name] is a 57 y.o. female who presents for chronic ds management. Husband is with her.  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  Worse over past 2-3 days.  Pain is in the calf and radiates up and around the popliteal area. No injury to leg On Xarelto due to history of CTEPH status post thromboembolectomy.  She also has an IVC filter in place.   -Patient is very emotional.  She feels that the swelling in the leg is due to fluid retention in her body.  She states that she is not voiding as much as she should and over the past few days she increased furosemide from 40 mg daily to 80 mg daily. -Endorses chronic two-pillow orthopnea, SOB and PND which she states is no worse than before. -She feels that she needs to be admitted to the hospital for fluid buildup.  States that she felt the same way when she had to be admitted before for excess fluid.  PAH/CTEPH: Up for re-certification on home O2.  However, pt feels she is in too much pain today to walk for use to check her O2 level.   Rxn 06/2017 for continuous use after her O 2 drop with 6 mins walk.  Uses it 80% of the time.  She does not have it with her in the office today.  She states that it is in the car. Endorses 2 pillow orthopnea, PND every night which is chronic for over 1 yr.   Request RF on Meclizine to use for constant dizziness.    Patient Active Problem List   Diagnosis Date Noted  . Bilateral primary osteoarthritis of knee  05/12/2018  . Plantar fasciitis of left foot 05/12/2018  . Shortness of breath 01/16/2018  . Palliative care patient 01/16/2018  . Major depressive disorder, recurrent severe without psychotic features ([place]) 12/06/2017  . CHF exacerbation ([place]) 08/21/2017  . (HFpEF) heart failure with preserved ejection fraction ([place]) 08/20/2017  . Lumbar radiculopathy 08/08/2017  . Body mass index 50.0-59.9, adult ([place]) 08/08/2017  . Hoarseness 08/08/2017  . Chronic cough 04/10/2017  . Rheumatoid arthritis involving multiple sites ([place]) 04/10/2017  . Anxiety and depression 04/10/2017  . Adjustment disorder with depressed mood 12/11/2016  . Morbid obesity ([place]) 08/14/2016  . Stroke ([place]) 06/27/2016  . Right sided weakness 06/27/2016  . Essential hypertension 06/27/2016  . Atypical chest pain 06/27/2016  . Difficult airway 03/29/2016  . CTEPH (chronic thromboembolic pulmonary hypertension) ([place]) 03/14/2016  . OSA on CPAP 02/20/2016  . Chronic pain syndrome 01/31/2016  . Pulmonary embolus ([place]) 11/15/2015  . Vertigo 11/15/2015  . Depression 11/15/2015     Current Outpatient Medications on File Prior to Visit  Medication Sig Dispense Refill  . albuterol (PROVENTIL HFA;VENTOLIN HFA) 108 (90 Base) MCG/ACT inhaler Inhale 1-2 puffs into the lungs every 6 (six) hours as needed for wheezing. 3 Inhaler 6  . ALPRAZolam (XANAX) 1 MG tablet Take 1 mg by mouth as needed for anxiety.    Marland Kitchen  atorvastatin (LIPITOR) 20 MG tablet Take 1 tablet (20 mg total) by mouth daily at 6 PM. 90 tablet 3  . clotrimazole-betamethasone (LOTRISONE) cream Apply 1 application topically 2 (two) times daily. 30 g 0  . cyclobenzaprine (FLEXERIL) 5 MG tablet TAKE 1 TABLET BY MOUTH TWICE DAILY AS NEEDED FOR MUSCLE SPASM 40 tablet 2  . docusate sodium (COLACE) 100 MG capsule Take 1 capsule (100 mg total) by mouth every 12 (twelve) hours. 30 capsule 0  . ferrous sulfate (FERROUSUL) 325 (65 FE) MG tablet Take 1 tablet (325 mg total) by mouth  daily with breakfast. 90 tablet 3  . fluticasone (FLONASE) 50 MCG/ACT nasal spray Place 2 sprays into both nostrils daily. (Patient taking differently: Place 2 sprays into both nostrils daily as needed for allergies. ) 16 g 6  . furosemide (LASIX) 40 MG tablet Take 1 tablet (40 mg total) by mouth daily. Take extra 40 mg tablet once in the afternoon AS NEEDED for weight gain 3 lbs or more. 180 tablet 3  . gabapentin (NEURONTIN) 300 MG capsule Take 2 capsules (600 mg total) by mouth 3 (three) times daily. 540 capsule 3  . loratadine (CLARITIN) 10 MG tablet Take 1 tablet (10 mg total) by mouth daily. 90 tablet 2  . omeprazole (PRILOSEC) 40 MG capsule Take 1 capsule (40 mg total) by mouth 2 (two) times daily. 180 capsule 3  . polyethylene glycol powder (GLYCOLAX/MIRALAX) powder Take 17 g by mouth 2 (two) times daily as needed. 3350 g 1  . Riociguat (ADEMPAS) 1.5 MG TABS Take 1.5 mg by mouth 3 (three) times daily. 90 tablet 11  . rivaroxaban (XARELTO) 20 MG TABS tablet Take 1 tablet (20 mg total) by mouth daily with supper. 90 tablet 3  . spironolactone (ALDACTONE) 25 MG tablet Take 0.5 tablets (12.5 mg total) by mouth daily. 45 tablet 3  . zolpidem (AMBIEN) 10 MG tablet Take 10 mg by mouth at bedtime as needed for sleep.     No current facility-administered medications on file prior to visit.     No Known Allergies  Social History   Socioeconomic History  . Marital status: Married    Spouse name: Not on file  . Number of children: Not on file  . Years of education: Not on file  . Highest education level: Not on file  Occupational History  . Not on file  Social Needs  . Financial resource strain: Not on file  . Food insecurity:    Worry: Not on file    Inability: Not on file  . Transportation needs:    Medical: Not on file    Non-medical: Not on file  Tobacco Use  . Smoking status: Never Smoker  . Smokeless tobacco: Never Used  Substance and Sexual Activity  . Alcohol use: No  .  Drug use: No  . Sexual activity: Yes    Partners: Male    Birth control/protection: Surgical  Lifestyle  . Physical activity:    Days per week: Not on file    Minutes per session: Not on file  . Stress: Not on file  Relationships  . Social connections:    Talks on phone: Not on file    Gets together: Not on file    Attends religious service: Not on file    Active member of club or organization: Not on file    Attends meetings of clubs or organizations: Not on file    Relationship status: Not on file  .  Intimate partner violence:    Fear of current or ex partner: Not on file    Emotionally abused: Not on file    Physically abused: Not on file    Forced sexual activity: Not on file  Other Topics Concern  . Not on file  Social History Narrative  . Not on file    Family History  Problem Relation Age of Onset  . Lung cancer Maternal Grandmother   . Dementia Mother   . Dementia Father   . Anxiety disorder Sister   . Bipolar disorder Sister   . Drug abuse Sister   . Sexual abuse Maternal Aunt   . Anxiety disorder Cousin   . Anxiety disorder Sister   . Bipolar disorder Sister   . Drug abuse Sister   . Breast cancer Neg Hx     Past Surgical History:  Procedure Laterality Date  . ABDOMINAL HYSTERECTOMY    . BREAST REDUCTION SURGERY    . CHOLECYSTECTOMY    . EMBOLECTOMY N/A    pulmonary embolectomy  . REDUCTION MAMMAPLASTY Bilateral   . RIGHT HEART CATH N/A 10/28/2016   Procedure: Right Heart Cath;  Surgeon: Jolaine Artist, MD;  Location: [facility];  Service: Cardiovascular;  Laterality: N/A;  . RIGHT HEART CATH N/A 08/26/2017   Procedure: RIGHT HEART CATH;  Surgeon: Jolaine Artist, MD;  Location: [facility];  Service: Cardiovascular;  Laterality: N/A;  . ULTRASOUND GUIDANCE FOR VASCULAR ACCESS  08/26/2017   Procedure: Ultrasound Guidance For Vascular Access;  Surgeon: Jolaine Artist, MD;  Location: [facility];  Service:  Cardiovascular;;    ROS: Review of Systems Negative except as above PHYSICAL EXAM: BP 133/82   Pulse 91   Temp 98.1 F (36.7 C) (Oral)   Resp 16   Wt (!) 341 lb 9.6 oz (154.9 kg)   SpO2 96%   BMI 56.85 kg/m   Wt Readings from Last 3 Encounters:  06/29/18 (!) 341 lb 9.6 oz (154.9 kg)  06/03/18 (!) 340 lb (154.2 kg)  05/29/18 (!) 333 lb (151 kg)   Physical Exam  General appearance - alert, well appearing, obese female and in no distress Mental status -patient tearful and emotional.   Chest - clear to auscultation, no wheezes, rales or rhonchi, symmetric air entry Heart - normal rate, regular rhythm, normal S1, S2, no murmurs, rubs, clicks or gallops.  No JVD Extremities - trace edema in RLE, 1+ edema in LLE with inc increased circumference of the calf.  Positive tenderness on palpation of the left upper calf and in the popliteal area.   ASSESSMENT AND PLAN: 1. Leg edema, left I recommend getting a Doppler ultrasound to check for clots and/or Baker's cyst.  I informed patient that I would first like to rule out these possibilities as a cause of the swelling in the leg rather than fluid buildup. At this point patient became very emotional stating that she feels it is fluid buildup in her body and that she has doubled the dose on furosemide but still putting out very little urine.  She tells me that she wasted her time coming here and that she should have gone to the emergency room.  I informed the patient that if she wishes to be seen in the emergency room she can or we can tentatively have her increase furosemide to 3 tablets a day pending the results of the ultrasound. -We will have her come back in 1 week to  do the recertification for her home oxygen. - VAS [place] LOWER EXTREMITY VENOUS (DVT); Future  2. Bilateral leg edema 3. 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 Lumbar radiculopathy Patient requested refill on Tylenol No. 4.  [place] controlled substance reporting  system reviewed and is appropriate.  Refill given - acetaminophen-codeine (TYLENOL #4) 300-60 MG tablet; Take 1 tablet by mouth every 8 (eight) hours as needed for moderate pain.  Dispense: 90 tablet; Refill: 0  Addendum: I received the results of Doppler ultrasound.  It revealed a ruptured Baker's cyst in the popliteal area.  It also showed clots in the peroneal veins in the mid to distal portion that appear chronic.  I called patient to inform her of the ultrasound results.  I told her that I think the pain and swelling she is having in the LT leg is because of these 2 issues more so the ruptured Baker's cyst.  However, I do recommend that we change from Xarelto to warfarin.  We will have to put her on Lovenox as we make this switch. Patient again became very emotional and started crying on the phone stating that she is going to the emergency room that she was not going to go home feeling like this. Patient was given the opportunity to ask questions.  Patient verbalized understanding of the plan and was able to repeat key elements of the plan.   No orders of the defined types were placed in this encounter.    Requested Prescriptions   Signed Prescriptions Disp Refills  . meclizine (ANTIVERT) 25 MG tablet 60 tablet 0    Sig: Take 1 tablet (25 mg total) by mouth 2 (two) times daily as needed for dizziness.  Marland Kitchen acetaminophen-codeine (TYLENOL #4) 300-60 MG tablet 90 tablet 0    Sig: Take 1 tablet by mouth every 8 (eight) hours as needed for moderate pain.    No follow-ups on file.  Karle Plumber, MD, FACP

## 2018-06-29 NOTE — Patient Instructions (Addendum)
If you decide not to be seen in the emergency room today, I recommend increase Furosemide to 40 mg 3 tablets daily for the next 3 days then back to 40 mg daily.   Influenza Virus Vaccine injection (Fluarix) What is this medicine? INFLUENZA VIRUS VACCINE (in floo EN zuh VAHY ruhs vak SEEN) helps to reduce the risk of getting influenza also known as the flu. This medicine may be used for other purposes; ask your health care provider or pharmacist if you have questions. COMMON BRAND NAME(S): Fluarix, Fluzone What should I tell my health care provider before I take this medicine? They need to know if you have any of these conditions: -bleeding disorder like hemophilia -fever or infection -Guillain-Barre syndrome or other neurological problems -immune system problems -infection with the human immunodeficiency virus (HIV) or AIDS -low blood platelet counts -multiple sclerosis -an unusual or allergic reaction to influenza virus vaccine, eggs, chicken proteins, latex, gentamicin, other medicines, foods, dyes or preservatives -pregnant or trying to get pregnant -breast-feeding How should I use this medicine? This vaccine is for injection into a muscle. It is given by a health care professional. A copy of Vaccine Information Statements will be given before each vaccination. Read this sheet carefully each time. The sheet may change frequently. Talk to your pediatrician regarding the use of this medicine in children. Special care may be needed. Overdosage: If you think you have taken too much of this medicine contact a poison control center or emergency room at once. NOTE: This medicine is only for you. Do not share this medicine with others. What if I miss a dose? This does not apply. What may interact with this medicine? -chemotherapy or radiation therapy -medicines that lower your immune system like etanercept, anakinra, infliximab, and adalimumab -medicines that treat or prevent blood clots  like warfarin -phenytoin -steroid medicines like prednisone or cortisone -theophylline -vaccines This list may not describe all possible interactions. Give your health care provider a list of all the medicines, herbs, non-prescription drugs, or dietary supplements you use. Also tell them if you smoke, drink alcohol, or use illegal drugs. Some items may interact with your medicine. What should I watch for while using this medicine? Report any side effects that do not go away within 3 days to your doctor or health care professional. Call your health care provider if any unusual symptoms occur within 6 weeks of receiving this vaccine. You may still catch the flu, but the illness is not usually as bad. You cannot get the flu from the vaccine. The vaccine will not protect against colds or other illnesses that may cause fever. The vaccine is needed every year. What side effects may I notice from receiving this medicine? Side effects that you should report to your doctor or health care professional as soon as possible: -allergic reactions like skin rash, itching or hives, swelling of the face, lips, or tongue Side effects that usually do not require medical attention (report to your doctor or health care professional if they continue or are bothersome): -fever -headache -muscle aches and pains -pain, tenderness, redness, or swelling at site where injected -weak or tired This list may not describe all possible side effects. Call your doctor for medical advice about side effects. You may report side effects to FDA at 1-800-FDA-1088. Where should I keep my medicine? This vaccine is only given in a clinic, pharmacy, doctor's office, or other health care setting and will not be stored at home. NOTE: This sheet is a  summary. It may not cover all possible information. If you have questions about this medicine, talk to your doctor, pharmacist, or health care provider.  2018 Elsevier/Gold Standard (2008-03-23  09:30:40)

## 2018-06-29 NOTE — Progress Notes (Signed)
Preliminary notes--Left lower extremity venous duplex exam completed. 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 A hypoechoic lesion seen at popliteal fossa with non-vascularity, measuring  2.10x2.11x0.81cm, etiology unknown. Result called Dr. [name].  [name] [name] (RDMS RVT)' 06/29/18 5:35 PM

## 2018-06-30 ENCOUNTER — Telehealth (HOSPITAL_COMMUNITY): Payer: Self-pay | Admitting: Licensed Clinical Social Worker

## 2018-06-30 ENCOUNTER — Telehealth: Payer: Self-pay | Admitting: Internal Medicine

## 2018-06-30 ENCOUNTER — Telehealth (HOSPITAL_COMMUNITY): Payer: Self-pay

## 2018-06-30 NOTE — Telephone Encounter (Signed)
Pt called and stated that she would like to change her PCP. Pt is [facility] pt.  [facility] representative stated that pt will need a referral and they will come out for an assessment.   I spoke with [place] (CSW) with heart failure clinic and she advised that she will put the referral in.  I called pt back and told there the same.

## 2018-06-30 NOTE — Telephone Encounter (Signed)
Will forward to pcp

## 2018-06-30 NOTE — Telephone Encounter (Signed)
Received call from community paramedic that patient would like to have new PCP through [facility] but that they needed order to set her up with one- CSW placed order for [facility] to follow up regarding PCP  CSW will continue to follow in clinic as needed  Jorge Ny, [place] Worker [facility] [phone]

## 2018-06-30 NOTE — Telephone Encounter (Signed)
Patient called because she wants to be referred to a vein and vascular specialist. Patient says she found out yesterday that she has a ruptured cyst and blood clots in her left leg. Please follow up with patient.

## 2018-06-30 NOTE — Telephone Encounter (Signed)
[name] [name] called and stated that her pcp suggested that she start taking warfarin due to blood clots.  She stated that she went to the specialist yesterday and was told that she has a ruptured cyst and blood clots in her leg.  Pt stated that she has a lot pain in her left leg.  She has taken pain meds last night.     She acknowledges that she has a appt with the heart failure clinic tomorrow.  She stated that the lasix isn't working.

## 2018-07-01 ENCOUNTER — Other Ambulatory Visit: Payer: Self-pay

## 2018-07-01 ENCOUNTER — Ambulatory Visit (HOSPITAL_BASED_OUTPATIENT_CLINIC_OR_DEPARTMENT_OTHER)
Admission: RE | Admit: 2018-07-01 | Discharge: 2018-07-01 | Disposition: A | Discharge status: Home / Self Care | Payer: Medicare HMO | Source: Ambulatory Visit | Attending: Internal Medicine | Admitting: Internal Medicine

## 2018-07-01 ENCOUNTER — Ambulatory Visit (HOSPITAL_COMMUNITY)
Admission: RE | Admit: 2018-07-01 | Discharge: 2018-07-01 | Disposition: A | Discharge status: Home / Self Care | Payer: Medicare HMO | Source: Ambulatory Visit | Attending: Internal Medicine | Admitting: Internal Medicine

## 2018-07-01 ENCOUNTER — Other Ambulatory Visit (HOSPITAL_COMMUNITY): Payer: Self-pay

## 2018-07-01 ENCOUNTER — Encounter (HOSPITAL_COMMUNITY): Payer: Self-pay | Admitting: Internal Medicine

## 2018-07-01 VITALS — BP 130/69 | HR 94 | Wt 345.2 lb

## 2018-07-01 DIAGNOSIS — Z79899 Other long term (current) drug therapy: Secondary | ICD-10-CM | POA: Insufficient documentation

## 2018-07-01 DIAGNOSIS — I2724 Chronic thromboembolic pulmonary hypertension: Secondary | ICD-10-CM | POA: Diagnosis not present

## 2018-07-01 DIAGNOSIS — R079 Chest pain, unspecified: Secondary | ICD-10-CM | POA: Diagnosis not present

## 2018-07-01 DIAGNOSIS — F329 Major depressive disorder, single episode, unspecified: Secondary | ICD-10-CM | POA: Diagnosis not present

## 2018-07-01 DIAGNOSIS — G894 Chronic pain syndrome: Secondary | ICD-10-CM | POA: Insufficient documentation

## 2018-07-01 DIAGNOSIS — M79605 Pain in left leg: Secondary | ICD-10-CM | POA: Diagnosis not present

## 2018-07-01 DIAGNOSIS — F419 Anxiety disorder, unspecified: Secondary | ICD-10-CM

## 2018-07-01 DIAGNOSIS — Z9989 Dependence on other enabling machines and devices: Secondary | ICD-10-CM | POA: Diagnosis not present

## 2018-07-01 DIAGNOSIS — I272 Pulmonary hypertension, unspecified: Secondary | ICD-10-CM

## 2018-07-01 DIAGNOSIS — G4733 Obstructive sleep apnea (adult) (pediatric): Secondary | ICD-10-CM

## 2018-07-01 DIAGNOSIS — R45851 Suicidal ideations: Secondary | ICD-10-CM | POA: Diagnosis not present

## 2018-07-01 DIAGNOSIS — R42 Dizziness and giddiness: Secondary | ICD-10-CM | POA: Insufficient documentation

## 2018-07-01 DIAGNOSIS — M797 Fibromyalgia: Secondary | ICD-10-CM | POA: Insufficient documentation

## 2018-07-01 DIAGNOSIS — M069 Rheumatoid arthritis, unspecified: Secondary | ICD-10-CM | POA: Diagnosis not present

## 2018-07-01 DIAGNOSIS — I509 Heart failure, unspecified: Secondary | ICD-10-CM | POA: Insufficient documentation

## 2018-07-01 DIAGNOSIS — E785 Hyperlipidemia, unspecified: Secondary | ICD-10-CM | POA: Insufficient documentation

## 2018-07-01 DIAGNOSIS — Z9049 Acquired absence of other specified parts of digestive tract: Secondary | ICD-10-CM | POA: Insufficient documentation

## 2018-07-01 DIAGNOSIS — Z6841 Body Mass Index (BMI) 40.0 and over, adult: Secondary | ICD-10-CM | POA: Diagnosis not present

## 2018-07-01 DIAGNOSIS — E875 Hyperkalemia: Secondary | ICD-10-CM | POA: Diagnosis not present

## 2018-07-01 DIAGNOSIS — Z7901 Long term (current) use of anticoagulants: Secondary | ICD-10-CM | POA: Insufficient documentation

## 2018-07-01 DIAGNOSIS — I2782 Chronic pulmonary embolism: Secondary | ICD-10-CM | POA: Insufficient documentation

## 2018-07-01 DIAGNOSIS — I110 Hypertensive heart disease with heart failure: Secondary | ICD-10-CM | POA: Diagnosis not present

## 2018-07-01 NOTE — Progress Notes (Signed)
Medication Samples have been provided to the patient.  Drug name: Xarelto       Strength: 20mg         Qty: 4 LOT: [id number], 188MG 952   Exp.Date: 6/21, 8/21  Dosing instructions: Take 1 tab daily  The patient has been instructed regarding the correct time, dose, and frequency of taking this medication, including desired effects and most common side effects.   Margarita Bobrowski 2:16 PM 07/01/2018

## 2018-07-01 NOTE — Patient Instructions (Addendum)
Continue current medication regimen  Your doctor recommends [facility] Diet to assist with weight loss.    You have been referred to [facility].   Your physician recommends that you schedule a follow-up appointment in: 6 months. Call in January for appt

## 2018-07-01 NOTE — Telephone Encounter (Signed)
PC returned to pt this a.m.  I left message on VM letting her know that I was returning her call regarding her request to see a vascular specialist.  Told her that I wanted to discuss whether referral to hematologist vs vascular surgeon would be best to give opinion about whether to change from Xarelto to Coumadin.  Also wanted to make sure she received message from my CMA informing her of change in date of her next appt with me.

## 2018-07-01 NOTE — Progress Notes (Signed)
  Echocardiogram 2D Echocardiogram has been performed.  [name] [name] 07/01/2018, 3:02 PM

## 2018-07-01 NOTE — Progress Notes (Signed)
Paramedicine Encounter   Patient ID: [name] [name] , female,   DOB: June 30, 1961,56 y.o.,  MRN: [id number]   Met patient in clinic today with provider Jonni Sanger.   Pt stated that she has dizziness sometimes throughout the day.  She's still taking themeclizine But "it works sometime" per pt. She's extra diuretics ; pt is taking extra lasix per her own. She stated that she's not voiding urine like she think she should. Pt is still having trouble sleeping.  She couldn't get the echocardiogram. Pt stated that she's been taking ibuprofen for pain. I advised her to stop taking it and continue with the tylenol.  Pt denies bleeding. Pt is unable to attend to participate in the weight loss program due to psyh eval.   Pt was given samples xarelto samples in the clinic for a month.    Jonni Sanger stated that they could send a referral to see a physical therapist to check for vertigo.he advised her that she may not have enough fluid to void with the extra lasix.  Dr. Leonia Reeves spoke to pt about weight loss goals. Suggested  "[employer]" to start off. tSuggested to not take the ibuprofen more than twice a week, due to also taking blood thinner.  [facility] called pt this am and they will call her back for an appointment to assess her for services.    Time spent with patient: [place], EMT-Paramedic [phone] 07/01/2018   ACTION: Next visit planned for in two weeks

## 2018-07-01 NOTE — Progress Notes (Signed)
PC placed to Dr. Oneida Alar 06/30/2018 to discuss results of doppler US of LT leg done 06/29/2018.  I just wanted to verify that Baker's cyst was seen as reported to me by tech Landry Mellow when she called to give me verbal report.  However, it was not mentioned on final reading that was done by him.  He states that the small structure that was seen in the popliteal area is very lightly cyst.

## 2018-07-01 NOTE — Progress Notes (Signed)
Pulmonary Hypertension Clinic Note  Date:  07/01/2018   ID:  [name] [name], DOB 03/21/1961, MRN [id number]  PCP:  Ladell Pier, MD  HF MD: Dr Haroldine Laws    HPI: [name] [name] is a 57 y.o. 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   Patient was in her [place] until 08/2015 when she started getting symptoms of dyspnea on exertion, dizziness, bilateral LE swelling, and constant R>L non-specific chest pain. These symptoms progressively worsened over several days prompting her to go to [facility] in her then-hometown of [place] in 09/2015 where she was diagnosed with PE and started on anticoagulation. She was very briefly on warfarin but quickly switched to Xarelto. She was referred to [facility] in 12/2015. chocardiogram (12-2015) showed normal LVEF 55-60%, normal LA, normal RV size, RVSP estimated as 70 mm Hg. She further was referred to [facility] and saw Dr. Karena Addison on 03/14/2016 where patient had several studies including V/Q scan which found high prob for PE; studies c/w CTEPH. It was felt that her chronic chest pain was caused by chronic PE. RHC/LHC/Pulm Angiogram 03/2016 confirmed diagnosis of CTEPH. She underwent pulmonary thromboembolectomy 03/31/2016 at [facility] with IVC filter placement. Pre-op cath with no CAD.   Seen by Dr. Karena Addison in 04/2016 for follow up. He felt like her heart size was decreasing. Plan was for lifelong [place] and ECHO and V/Q in 4 months. Dr. Karena Addison has since left the [facility] practice.  CT angio at [facility] on 05/15/16 showed " persistent but partially recanalized PE within right lower lobe pulmonary artery. No new PE identified. "  She was admitted to [facility] in 06/2016 for continued chest pain and parasthesias. V/Q scan on 06/27/16 showed prominent ventilation perfusion mismatch  in right c/w chronic PE. Head CT negative. She was continued on Xarelto and told to follow up with [facility]. Patient doesn't want to have to travel to [facility] for care so [facility] care here in [place].   Admitted earlier this month with SOB and chest pain. Diuresed with IV lasix and transitioned to lasix 40 mg daily + 12.5 mg spiro. Seen by HF team and set up for [place] as an outpatient. Discharge 336 pounds.   Admitted to [place] for suicidal ideation. She was then sent to [facility] for additional treatment. She has ongoing psychiatric follow up.   She presents today for follow up with Echo. Adempas increased at last visit. Has been having LLE pain. 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 She is very anxious and frustrated about her condition. Breathing is "Not good". SOB walking to mail box. Hurts to walk currently, so not doing much. Her biggest complaint is constant dizziness, worse with sitting to standing and walking around. Comes and goes. She takes meclizine. Thinks it maybe "takes the edge off". She has a history of vertigo. Remains in paramedicine.   Echo today with poor images due to body habitus. 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 PA pressures could not be estimated. 04/2017: EF 55-60%  RV normal RVSP 46mmHG  RHC 08/26/2017 RA = 6 RV = 50/7 PA = 48/13 (30) PCW = 6 Fick cardiac  output/index = 7.0/2.9 PVR = 3.1 WU Ao sat = 97% PA sat = 67%, 70% SVC sat = 70% Assessment: Mild PAH (on Adempas 1mg  tid) with high cardiac output. No evidence of intracardiac shunting.   10/2016 RHC RA = 8 RV = 64/13 PA = 65/19 (35) PCW = 9 Fick cardiac output/index = 7.5/3.1 PVR = 3.5 WU Ao sat = 96% PA sat = 68%,70% SVC sat = 71% Assessment: 1. Mild residual PAH in the setting of CTEPH s/p pulmonary  thrombolectomy 2. Normal cardiac output 3. Normal left-sided filling pressures  Past Medical History:  Diagnosis Date  . Arthritis   . CHF exacerbation ([place]) 08/21/2017  . Depression   . Diverticulitis   . Hyperlipidemia   . Hypertension   . Obesity   . Pneumonia   . Pulmonary embolism [facility])     Past Surgical History:  Procedure Laterality Date  . ABDOMINAL HYSTERECTOMY    . BREAST REDUCTION SURGERY    . CHOLECYSTECTOMY    . EMBOLECTOMY N/A    pulmonary embolectomy  . REDUCTION MAMMAPLASTY Bilateral   . RIGHT HEART CATH N/A 10/28/2016   Procedure: Right Heart Cath;  Surgeon: Jolaine Artist, MD;  Location: [place] [facility];  Service: Cardiovascular;  Laterality: N/A;  . RIGHT HEART CATH N/A 08/26/2017   Procedure: RIGHT HEART CATH;  Surgeon: Jolaine Artist, MD;  Location: [place] [facility];  Service: Cardiovascular;  Laterality: N/A;  . ULTRASOUND GUIDANCE FOR VASCULAR ACCESS  08/26/2017   Procedure: Ultrasound Guidance For Vascular Access;  Surgeon: Jolaine Artist, MD;  Location: [place] [facility];  Service: Cardiovascular;;    Current Medications:  Current Outpatient Medications on File Prior to Encounter  Medication Sig Dispense Refill  . acetaminophen-codeine (TYLENOL #4) 300-60 MG tablet Take 1 tablet by mouth every 8 (eight) hours as needed for moderate pain. 90 tablet 0  . albuterol (PROVENTIL HFA;VENTOLIN HFA) 108 (90 Base) MCG/ACT inhaler Inhale 1-2 puffs into the lungs every 6 (six) hours as needed for wheezing. 3 Inhaler 6  . ALPRAZolam (XANAX) 1 MG tablet Take 1 mg by mouth as needed for anxiety.    Marland Kitchen atorvastatin (LIPITOR) 20 MG tablet Take 1 tablet (20 mg total) by mouth daily at 6 PM. 90 tablet 3  . cyclobenzaprine (FLEXERIL) 5 MG tablet TAKE 1 TABLET BY MOUTH TWICE DAILY AS NEEDED FOR MUSCLE SPASM 40 tablet 2  . docusate sodium (COLACE) 100 MG capsule Take 1 capsule (100 mg total) by mouth every 12 (twelve) hours. 30 capsule 0  . ferrous  sulfate (FERROUSUL) 325 (65 FE) MG tablet Take 1 tablet (325 mg total) by mouth daily with breakfast. 90 tablet 3  . fluticasone (FLONASE) 50 MCG/ACT nasal spray Place 2 sprays into both nostrils daily. 16 g 6  . furosemide (LASIX) 40 MG tablet Take 1 tablet (40 mg total) by mouth daily. Take extra 40 mg tablet once in the afternoon AS NEEDED for weight gain 3 lbs or more. 180 tablet 3  . gabapentin (NEURONTIN) 300 MG capsule Take 2 capsules (600 mg total) by mouth 3 (three) times daily. 540 capsule 3  . meclizine (ANTIVERT) 25 MG tablet Take 1 tablet (25 mg total) by mouth 2 (two) times daily as needed for dizziness. 60 tablet 0  . omeprazole (PRILOSEC) 40 MG capsule Take 1 capsule (40 mg total) by mouth 2 (two) times daily. 180 capsule 3  . polyethylene glycol powder (GLYCOLAX/MIRALAX) powder Take 17 g by mouth  2 (two) times daily as needed. 3350 g 1  . Riociguat (ADEMPAS) 1.5 MG TABS Take 1.5 mg by mouth 3 (three) times daily. 90 tablet 11  . rivaroxaban (XARELTO) 20 MG TABS tablet Take 1 tablet (20 mg total) by mouth daily with supper. 90 tablet 3  . spironolactone (ALDACTONE) 25 MG tablet Take 0.5 tablets (12.5 mg total) by mouth daily. 45 tablet 3  . zolpidem (AMBIEN) 10 MG tablet Take 10 mg by mouth at bedtime as needed for sleep.     No current facility-administered medications on file prior to encounter.     Allergies:   [name].   Social History   Socioeconomic History  . Marital status: Married    Spouse name: Not on file  . Number of children: Not on file  . Years of education: Not on file  . Highest education level: Not on file  Occupational History  . Not on file  Social Needs  . Financial resource strain: Not on file  . Food insecurity:    Worry: Not on file    Inability: Not on file  . Transportation needs:    Medical: Not on file    Non-medical: Not on file  Tobacco Use  . Smoking status: Never Smoker  . Smokeless tobacco: Never Used    Substance and Sexual Activity  . Alcohol use: No  . Drug use: No  . Sexual activity: Yes    Partners: Male    Birth control/protection: Surgical  Lifestyle  . Physical activity:    Days per week: Not on file    Minutes per session: Not on file  . Stress: Not on file  Relationships  . Social connections:    Talks on phone: Not on file    Gets together: Not on file    Attends religious service: Not on file    Active member of club or organization: Not on file    Attends meetings of clubs or organizations: Not on file    Relationship status: Not on file  Other Topics Concern  . Not on file  Social History Narrative  . Not on file    Family History:  The patient's family history includes Anxiety disorder in her cousin, sister, and sister; Bipolar disorder in her sister and sister; Dementia in her father and mother; Drug abuse in her sister and sister; Lung cancer in her maternal grandmother; Sexual abuse in her maternal aunt.     Review of systems complete and found to be negative unless listed in HPI.   PHYSICAL EXAM:    Vitals:   07/01/18 1412  BP: 130/69  Pulse: 94  SpO2: 99%  Weight: (!) 156.6 kg (345 lb 3.2 oz)    Wt Readings from Last 3 Encounters:  07/01/18 (!) 156.6 kg (345 lb 3.2 oz)  06/29/18 (!) 154.9 kg (341 lb 9.6 oz)  06/03/18 (!) 154.2 kg (340 lb)    General: Fatigued/tearful at times. No resp difficulty. HEENT: Normal Neck: Supple. JVP difficult due to body habitus. Carotids 2+ bilat; no bruits. No thyromegaly or nodule noted. Cor: PMI non-palpable. Distant, heart sounds. RRR, No M/G/R noted Lungs: CTAB, normal effort. Abdomen: Morbidly obese, soft, non-tender, non-distended, no HSM. No bruits or masses. +BS  Extremities: No cyanosis, clubbing, or rash. BLE trace to 1+ edema. Neuro: Alert & orientedx3, cranial nerves grossly intact. moves all 4 extremities w/o difficulty. Affect pleasant   Studies/Labs Reviewed:   Recent Labs: 12/26/2017: B  Natriuretic Peptide 14.0 03/02/2018: TSH 1.107 06/05/2018: ALT 12; BUN 14; Creatinine, Ser 1.08; Hemoglobin 11.7; Platelets 277; Potassium 3.6; Sodium 144   Lipid Panel    Component Value Date/Time   CHOL 166 06/27/2016 0346   TRIG 125 06/27/2016 0346   HDL 33 (L) 06/27/2016 0346   CHOLHDL 5.0 06/27/2016 0346   VLDL 25 06/27/2016 0346   LDLCALC 108 (H) 06/27/2016 0346    Additional studies/ records that were reviewed today include:   ECHO 8/18 EF 55-60%  RV normal RVSP 43mmHG   2D ECHO: 12/15/2015 LV EF: 55% -   60% Study Conclusions - Left ventricle: The cavity size was normal. Wall thickness was normal. Systolic function was normal. The estimated ejection  fraction was in the range of 55% to 60%. - Pulmonary arteries: PA peak pressure: 70 mm Hg (S). - Pericardium, extracardiac: A trivial pericardial effusion was   identified.   2D ECHO 04/05/16 INTERPRETATION:NORMAL LEFT VENTRICULAR FUNCTION WITH MILD LVHSEVERE RV SYSTOLIC DYSFUNCTION (See above) VALVULAR REGURGITATION: TRIVIAL PR, MILD TR NO VALVULAR STENOSIS TRIVIAL PERICARDIAL EFFUSION Compared with prior Echo study on 03/14/2016: RVSP increased from 50 to 69   CATH right heart and coronary angiography: 03/25/2016 [place] Component Name Value Ref Range  Cardiac Index (l/min/m2) 2.3 L/min/m2  Right Atrium Mean Pressure (mmHg) 11 mmHg   Right Ventricle Systolic Pressure (mmHg) 68 mmHg   Pulmonary Artery Mean Pressure (mmHg) 40 mmHg   Pulmonary Wedge Pressure (mmHg) 14 mmHg   Pulmonary Vascular Resistance (Wood units) 4.7    CT angio 05/15/16 IMPRESSION: Persistent but partially recanalized pulmonary embolus within the right lower lobe pulmonary artery. No new pulmonary embolism is identified. Minimal bibasilar atelectatic changes stable from the prior exam.   V/Q scan 06/27/16 IMPRESSION: Prominent ventilation perfusion mismatch noted on the right. Finding suggests high probability  right-sided pulmonary embolus in this patient with known right-sided pulmonary embolus.   ASSESSMENT & PLAN:    1. 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 No bleeding.  - Recent echo 8/18 with no evidence RV strain or PAH (RVSP estimated 93mmHG) - RHC 12/18 with very mild PAH and PVR 3.1 WU - Echo today shows EF 55-60% RV not well seen but looks ok No septal flattening  - Continue adempas 1.5 mg TID - Chronic NYHA II-III (no change) - Volume status ok  - Continue lasix 40 mg daily with an extra 40 mg daily as needed for 3 pound weight gain. Can take extra today.   2. Dizziness - Continue meclizine - Can refer to PT for neuro-vestibular training as needed.   3. Chronic chest pain:  - Has chronic PE s/p thrombectomy, LHC performed at [facility] 7/17 with no CAD.  - No changes or angina type episodes.    4. Fibromyalgia/Rheumatoid Arthitis:  - Follows with Rheumatology. No change.    5. Morbid obesity with OHS:  - Body mass index is 57.44 kg/m.  - Encouraged weight loss. Seen at [facility] weight lost center. Turned down due to her suicidal ideations admission.   6. OSA - Encouraged nightly CPAP use  7. Depression/anxiety - Receiving outpatient therapy.   - Per PCP  8. Hyperkalemia  - Cr and K stable 06/05/18    Shirley Friar, PA-C  2:13 PM   Patient seen and examined with  the above-signed Energy manager and/or Housestaff. I personally reviewed laboratory data, imaging studies and relevant notes. I independently examined the patient and formulated the important aspects of the plan. I have edited the note to reflect any of my changes or salient points. I have personally discussed the plan with the patient and/or family.  Echo reviewed personally. Limited images due to size but LVEF is normal. RV  appears grossly normal. There is no septal flattening. No suggestion of severe PAH. Volume status looks good on exam. Recently found to have some chronic LE clots. Remains on [facility] and has IVC filter in place. Will continue Adempas. Will do hall walk today to assess need for home O2. Stressed need for weight loss and have suggested [employer]. Also stressed need for compliance with CPAp which she is not using regularly.   Glori Bickers, MD  6:03 PM

## 2018-07-01 NOTE — Patient Outreach (Signed)
[facility]) Care Management  07/01/2018  Isabeau Mccalla [facility] 1961-03-31 [id number]   Referral Date: 06/30/18 Referral Source: MD referral Referral Reason: Needing PCP   Outreach Attempt: spoke with patient.  She is able to verify HIPAA.  Discussed reason for referral.  Patient reports that she has so many health issues and is having a hard time managing her illnesses.  She states saw her PCP on Monday and was not pleased with her visit and is requesting help also with finding a new PCP.  Patient mentions having services previously and would like a nurse to see her.  She recently states she was diagnosed with a cyst to her left leg with blood clots to her leg as well.  Patient to see Dr. Haroldine Laws today for follow up in the heart failure clinic due to edema . Patient also seen by the paramedicine program and states that they are going to meet her today at Dr. Clayborne Dana office today.    Per chart review: 57 year old female with history of HTN, morbid obesity, PAH due to CTEPHstatus post thromboembolectomy 7/17 with IVC filter placement and now on [place], OSAon CPAP, anx/dep, chronic pain syndrome with fibromyalgia and questionable RA, chronic chest pain, chronic anemia on iron,lumbar radiculopathy.  Patient states her depression is better and she is learning to remain calm even when situations tend to make her upset.  She stats she saw her psychiatrist this week as well. She states that she is taking care of her mother who is on dialysis and a disabled grandson and expresses stress in handling their care needs along with hers.     Consent: Discussed [facility] services and request community nurse for care coordination, education and support.     Plan: RN CM will refer to community nurse.     Jone Baseman, RN, MSN [facility] Care Management Care Management Coordinator Direct Line [phone] Toll Free: [phone]  Fax: [phone]

## 2018-07-01 NOTE — Progress Notes (Signed)
SATURATION QUALIFICATIONS: (This note is used to comply with regulatory documentation for home oxygen)  Patient Saturations on Room Air at Rest = 97%  Patient Saturations on Room Air while Ambulating = 89%   

## 2018-07-02 ENCOUNTER — Other Ambulatory Visit: Payer: Medicare HMO | Admitting: Internal Medicine

## 2018-07-02 ENCOUNTER — Telehealth: Payer: Self-pay | Admitting: Internal Medicine

## 2018-07-02 DIAGNOSIS — M79605 Pain in left leg: Secondary | ICD-10-CM

## 2018-07-02 DIAGNOSIS — Z515 Encounter for palliative care: Secondary | ICD-10-CM

## 2018-07-02 DIAGNOSIS — F4321 Adjustment disorder with depressed mood: Secondary | ICD-10-CM

## 2018-07-02 DIAGNOSIS — R0602 Shortness of breath: Secondary | ICD-10-CM

## 2018-07-02 NOTE — Progress Notes (Signed)
Order to D/C home oxygen faxed to [facility] at [phone] per Dr Haroldine Laws

## 2018-07-02 NOTE — Telephone Encounter (Signed)
[name] from [place] [facility] called to get new orders for port a cath. flush. Please follow up with [facility] from [place] [facility].

## 2018-07-02 NOTE — Progress Notes (Signed)
PALLIATIVE CARE CONSULT VISIT   PATIENT NAME: [name] [name] DOB: 06-13-1961 MRN: [id number]  PRIMARY CARE PROVIDER:   Ladell Pier, MD  REFERRING PROVIDER:      Ladell Pier, MD [place],  [id number]  RESPONSIBLE PARTY:   Self   RECOMMENDATIONS and PLAN:  1. Adjustment disorder with depressed mood  F43.21:  Controlled with meds and therapy.  Encouragement given.    Monitor  2. Shortness of breath  R06.02:  Improved without use of O2.  Continue nightly CPAP use and FU with cardiologist/pulmonologist.   3.  Leg pain  M79.605:  Related to DVT.  Leg elevation.  Continue Xarelto.   4.  Palliative care encounter Z51.5:  Pt' continues to work on goals of improving baseline respiratory function and increase independence within her home.  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  I spent 30 minutes providing this consultation at home,  from 1:00pm to 1:30pm at home. More than 50% of the time in this consultation was spent coordinating communication with patient.   HISTORY OF PRESENT ILLNESS:  Follow-up with   [name].    She reports that her breathing and energy have improved.  Mood has been stable.  Her only complaint is of pain and swelling of her distal LLE related to recently diagnosed with a DVT.  No hospitalizations since last visit. CODE STATUS: FULL CODE  PPS: 50% HOSPICE ELIGIBILITY/DIAGNOSIS: TBD  PAST MEDICAL HISTORY:  Past Medical History:  Diagnosis Date  . Arthritis   . CHF exacerbation ([place]) 08/21/2017  . Depression   . Diverticulitis   . Hyperlipidemia   . Hypertension   . Obesity   . Pneumonia   . Pulmonary embolism ([place])     SOCIAL HX:  Social History   Tobacco Use  . Smoking status: Never Smoker  . Smokeless tobacco: Never Used  Substance Use Topics  . Alcohol use: No    ALLERGIES: No Known Allergies   PERTINENT MEDICATIONS:  Outpatient Encounter Medications as of 01/14/2018  Medication  Sig  . acetaminophen-codeine (TYLENOL #4) 300-60 MG tablet Take 1 tablet by mouth every 8 (eight) hours as needed for moderate pain.  Derrill Memo ON 02/08/2018] acetaminophen-codeine (TYLENOL #4) 300-60 MG tablet Take 1 tablet by mouth every 8 (eight) hours as needed for moderate pain.  Marland Kitchen albuterol (PROVENTIL HFA;VENTOLIN HFA) 108 (90 Base) MCG/ACT inhaler Inhale 1-2 puffs into the lungs every 6 (six) hours as needed for wheezing.  Marland Kitchen ALPRAZolam (XANAX) 1 MG tablet Take 1 mg by mouth as needed for anxiety.  Marland Kitchen atorvastatin (LIPITOR) 20 MG tablet Take 1 tablet (20 mg total) by mouth daily at 6 PM.  . benzonatate (TESSALON) 100 MG capsule Take 1 capsule (100 mg total) by mouth every 8 (eight) hours.  . clotrimazole-betamethasone (LOTRISONE) cream Apply 1 application topically 2 (two) times daily.  . cyclobenzaprine (FLEXERIL) 10 MG tablet Take 10 mg by mouth at bedtime.  . cyclobenzaprine (FLEXERIL) 5 MG tablet Take 1 tablet (5 mg total) by mouth 2 (two) times daily as needed. for muscle spams  . docusate sodium (COLACE) 100 MG capsule Take 1 capsule (100 mg total) by mouth every 12 (twelve) hours.  . ferrous sulfate (FERROUSUL) 325 (65 FE) MG tablet Take 1 tablet (325 mg total) by mouth daily with breakfast.  . fluticasone (FLONASE) 50 MCG/ACT nasal spray Place 2 sprays into both nostrils daily. (Patient taking differently: Place 2 sprays into  both nostrils daily as needed for allergies. )  . furosemide (LASIX) 40 MG tablet Take 1 tablet (40 mg total) by mouth daily. Take extra 40 mg tablet once in the afternoon AS NEEDED for weight gain 3 lbs or more.  . gabapentin (NEURONTIN) 300 MG capsule Take 2 capsules (600 mg total) by mouth 3 (three) times daily.  Marland Kitchen loratadine (CLARITIN) 10 MG tablet Take 1 tablet (10 mg total) by mouth daily.  . meclizine (ANTIVERT) 25 MG tablet TAKE ONE TABLET BY MOUTH THREE TIMES DAILY AS NEEDED FOR  DIZZINESS  . omeprazole (PRILOSEC) 40 MG capsule Take 1 capsule (40 mg total) by  mouth 2 (two) times daily.  . Riociguat (ADEMPAS) 1 MG TABS Take 1 mg by mouth 3 (three) times daily. At 0700, 1500 and 2300  . rivaroxaban (XARELTO) 20 MG TABS tablet Take 1 tablet (20 mg total) by mouth daily with supper.  Marland Kitchen spironolactone (ALDACTONE) 25 MG tablet Take 0.5 tablets (12.5 mg total) by mouth daily.  Marland Kitchen zolpidem (AMBIEN) 10 MG tablet Take 10 mg by mouth at bedtime as needed for sleep.   No facility-administered encounter medications on file as of 01/14/2018.     PHYSICAL EXAM:   General: NAD,morbidly obese middle aged female sitting in chair Cardiovascular: regular rate and rhythm Pulmonary: clear in all lung fields Abdomen: soft, nontender, + bowel sounds Extremities: 1+ edema without ecchymosis of LLE Skin: Exposed skin is intact Neurological: A&O x3.   Psych:  Cheerful mood with appropriate speech.  Good eye contact.  Calm and cooperative.  Gonzella Lex, NP-C

## 2018-07-02 NOTE — Addendum Note (Signed)
Encounter addended by: Scarlette Calico, RN on: 07/02/2018 1:38 PM  Actions taken: Sign clinical note

## 2018-07-03 ENCOUNTER — Ambulatory Visit: Payer: Self-pay | Admitting: Internal Medicine

## 2018-07-06 ENCOUNTER — Encounter: Payer: Self-pay | Admitting: *Deleted

## 2018-07-06 ENCOUNTER — Ambulatory Visit: Payer: Self-pay | Admitting: Internal Medicine

## 2018-07-06 ENCOUNTER — Other Ambulatory Visit: Payer: Self-pay | Admitting: *Deleted

## 2018-07-06 NOTE — Patient Outreach (Signed)
[name] [name] [name]) Care Management  07/06/2018  [name] [name] [name] [facility] [name] [name] [name] [id number]    RN spoke with pt today and verified identifiers. RN introduced the [facility] program and purpose for today's call. Pt was referral via telephonic [facility] nurse for care coordination. Pt states she needs guidance to find another primary provider to avoid going to the ED or [name] [name]. States she is no please with the current care and request options for finding another provider. RN inquired if she has discussed this with the current provider. Pt states she has made several request and statements to avoid going to the ED however not pleased again with the responses. Pt has indicated that she will not seek medical attention from this provider any longer instead indicates she will go to the ED or [name] [name] for care. RN provided the [name] [name] [name] website for a list of providers in the area due to pt's request to stay within the [facility] network. Will encouraged pt to establish this connect as soon as possible due to her ongoing medical issues. Other conversation today related to pt's past medical history with CHF that is managed by Dr. Haroldine [name] and pt states she gets regular visits from [name] [name] [name]). RN discussed the three zones and verified pt remains in the GREEN zone with no acute issues at this time. Pt states she is aware of the HF zones and has plenty of information with much discussion from para-medicine on the HF zones. Pt reports minimal swelling to her LE and pt has permission to take an extra fluid pill when needed. Pt reports visit to HF clinic Dr. Haroldine [name] performed an ECHO and discontinued his home O2 that [facility] will pick up today. Pt states she is breathing good and has a pulse ox to check her pulse regularly. Pt reports no issues with her HTN. State she has low BPs due to her daily medications.  States issues with her vertigo and dizziness. Provider has recommended a consult with a  neurologist to confirm this in not related to CAD issues. This appointments is pending at this time.   Plan of care established with a plan of care related to [name] [name] prevention, finding a PCP and adherence with scheduled appointments. Pt receptive and willing to work with the goals and interventions discussed today.   Due to the pending primary establishment RN offered a home visit however pt feels there is no needs to address for a home visit but receptive to ongoing telephone case management until she is established with the new provider. Pt very appreciative and was provider RN contact number if needed prior to the next follow up call. Will scheduled the next follow up appointment over the next few weeks.   [name] [name] [name]     Most Recent Value  Care Plan Problem [name]  [name] [name] admission preventions  Role Documenting the Problem [name]  Care Management [place] for Problem [name]  Active  [name] Long Term Goal   Pt will avoid hospitalization with use of prevention measures over the next 90 days.  [name] Long Term Goal Start Date  07/06/18  Interventions for Problem [name] Long Term Goal  Discussed prevention measures to avoid entering the [name] [name] for acute symptoms. Discussed HTN/HF and overall daily monitoring to avoid acute events.  [name] CM Short Term Goal #1   Pt will establish a new provider over the next 30 days .  [name] CM Short Term Goal #1 Start Date  07/06/18  Interventions for Short Term Goal #1  Provider [name] [name] [name] website for options for new providers within network for ongoing service. Will strongly encourage pt to alert her insurance carrier of this chane once established.  [name] CM Short Term Goal #2   Adherence with attending all scheduled medical appointments over the next 30 days  [name] CM Short Term Goal #2 Start Date  07/06/18  Interventions for Short Term Goal #2  Discussed the importance of attending all scheduled appointments to lower the risk of acute events that may  lead to hospitalizations. Will strongly encouraged pt to establish a primar provider soon to avoid visits to the ED,       [name] Mina, RN Care Management Coordinator [place] Office [phone]

## 2018-07-08 ENCOUNTER — Encounter (HOSPITAL_COMMUNITY): Payer: Self-pay

## 2018-07-08 ENCOUNTER — Encounter (HOSPITAL_COMMUNITY)
Admission: RE | Admit: 2018-07-08 | Discharge: 2018-07-08 | Disposition: A | Discharge status: Home / Self Care | Payer: Medicare HMO | Source: Ambulatory Visit | Attending: Internal Medicine | Admitting: Internal Medicine

## 2018-07-08 DIAGNOSIS — Z452 Encounter for adjustment and management of vascular access device: Secondary | ICD-10-CM | POA: Diagnosis not present

## 2018-07-08 DIAGNOSIS — I2782 Chronic pulmonary embolism: Secondary | ICD-10-CM | POA: Diagnosis not present

## 2018-07-08 DIAGNOSIS — I5022 Chronic systolic (congestive) heart failure: Secondary | ICD-10-CM | POA: Insufficient documentation

## 2018-07-08 MED ORDER — SODIUM CHLORIDE 0.9% FLUSH: 10.0000 mL | Freq: Once | INTRAVENOUS | Status: DC

## 2018-07-08 MED ORDER — HEPARIN SOD (PORK) LOCK FLUSH 100 UNIT/ML IV SOLN
INTRAVENOUS | Status: AC
  Administered 2018-07-08: 500 [IU] via INTRAVENOUS
  Filled 2018-07-08: qty 5

## 2018-07-08 MED ORDER — HEPARIN SOD (PORK) LOCK FLUSH 100 UNIT/ML IV SOLN
500.0000 [IU] | INTRAVENOUS | Status: DC
  Administered 2018-07-08: 500 [IU] via INTRAVENOUS

## 2018-07-10 DIAGNOSIS — M79605 Pain in left leg: Secondary | ICD-10-CM | POA: Insufficient documentation

## 2018-07-13 ENCOUNTER — Other Ambulatory Visit: Payer: Self-pay | Admitting: Internal Medicine

## 2018-07-13 DIAGNOSIS — I82552 Chronic embolism and thrombosis of left peroneal vein: Secondary | ICD-10-CM

## 2018-07-16 ENCOUNTER — Ambulatory Visit: Payer: Self-pay | Admitting: Internal Medicine

## 2018-07-20 ENCOUNTER — Other Ambulatory Visit: Payer: Self-pay | Admitting: *Deleted

## 2018-07-20 NOTE — Patient Outreach (Signed)
[place] [facility]) Care Management  07/20/2018  [name] [name] [facility] 06-27-1961 [id number]   Telephone Assessment  RN spoke with pt today and inquired further on her ongoing management of care. Pt indicated she went to the [facility] website and began contacting primary offices with appointments that are out as far as Feb. Pt indicated she would remain with her current provider until that time prior to establishing a new primary provider. Pt reports she has an appointment on 11/18 with her primary and will request a Vein and Vascular provider. Pt continues to verify her ongoing management of care. States she gained weight over night of 6 lbs (HF) but has establish a action from her provider's office Dr. Haroldine Laws and took 2 pill fluid pills as informed. States she swelling has reduced and she has some relief but aware of when to seek medical attention with this medical condition. Discussed if she feels additional education and interventions are needed for this medical condition [facility] can educate and address this subject also with further education however pt feels the current action plan seems to work over a few days. Briefly discussed the HF zones for pt's review of knowledge base and verified pt is currently in the YELLOW zone with orders from her provider on what to do and a pending follow up appointment next week. Note pt is also being seem by Para-medicine Karena Addison) who visits regularly from the HF clinic.   Plan of care reviewed as pt verified she has been in attendance with all her medical appointments and continues to have sufficient transportation. Pt take all medications as prescribed however continues to obtain samples from her provider's office when needed if in financial hardship. RN inquired on spouse's assistance if needed. Pt indicates she will have to work it out between the two of them if this is something that is encountered. Goal and interventions discussed and adjusted accordingly. No  needed resources at this time. RN offered to continue encouraged adherence with the plan of care and ongoing management of care. WIll follow up in few weeks with the ongoing above plan of care and assist accordingly.  [name], RN Care Management Coordinator [place] Office [phone]

## 2018-07-22 DIAGNOSIS — H5213 Myopia, bilateral: Secondary | ICD-10-CM | POA: Diagnosis not present

## 2018-07-22 DIAGNOSIS — H524 Presbyopia: Secondary | ICD-10-CM | POA: Diagnosis not present

## 2018-07-22 DIAGNOSIS — H52209 Unspecified astigmatism, unspecified eye: Secondary | ICD-10-CM | POA: Diagnosis not present

## 2018-07-24 ENCOUNTER — Telehealth (HOSPITAL_COMMUNITY): Payer: Self-pay

## 2018-07-24 NOTE — Telephone Encounter (Signed)
Pt called to schedule a CHP visit/ pt agrees to visit Tuesday

## 2018-07-27 ENCOUNTER — Ambulatory Visit: Payer: Self-pay | Admitting: Internal Medicine

## 2018-07-28 ENCOUNTER — Telehealth (HOSPITAL_COMMUNITY): Payer: Self-pay

## 2018-07-28 NOTE — Telephone Encounter (Signed)
Pt called to reschedule today's 12 pm appt, for tomorrow @ 12

## 2018-07-30 ENCOUNTER — Other Ambulatory Visit: Payer: Medicare HMO | Admitting: Internal Medicine

## 2018-08-04 ENCOUNTER — Encounter (HOSPITAL_COMMUNITY)
Admission: RE | Admit: 2018-08-04 | Discharge: 2018-08-04 | Disposition: A | Discharge status: Home / Self Care | Payer: Medicare HMO | Source: Ambulatory Visit | Attending: Internal Medicine | Admitting: Internal Medicine

## 2018-08-04 ENCOUNTER — Encounter (HOSPITAL_COMMUNITY): Payer: Self-pay

## 2018-08-04 DIAGNOSIS — I5022 Chronic systolic (congestive) heart failure: Secondary | ICD-10-CM | POA: Diagnosis not present

## 2018-08-04 DIAGNOSIS — I2782 Chronic pulmonary embolism: Secondary | ICD-10-CM | POA: Insufficient documentation

## 2018-08-04 DIAGNOSIS — Z452 Encounter for adjustment and management of vascular access device: Secondary | ICD-10-CM | POA: Insufficient documentation

## 2018-08-04 MED ORDER — HEPARIN SOD (PORK) LOCK FLUSH 100 UNIT/ML IV SOLN
500.0000 [IU] | INTRAVENOUS | Status: DC
  Administered 2018-08-04: 500 [IU] via INTRAVENOUS
  Filled 2018-08-04: qty 5

## 2018-08-04 MED ORDER — SODIUM CHLORIDE 0.9% FLUSH
10.0000 mL | Freq: Once | INTRAVENOUS | Status: DC
  Filled 2018-08-04: qty 10

## 2018-08-10 DIAGNOSIS — F332 Major depressive disorder, recurrent severe without psychotic features: Secondary | ICD-10-CM | POA: Diagnosis not present

## 2018-08-13 ENCOUNTER — Ambulatory Visit: Payer: Medicare HMO | Attending: Internal Medicine | Admitting: Internal Medicine

## 2018-08-13 ENCOUNTER — Encounter: Payer: Self-pay | Admitting: Internal Medicine

## 2018-08-13 VITALS — BP 119/71 | HR 98 | Temp 97.5°F | Ht 65.0 in | Wt 340.2 lb

## 2018-08-13 DIAGNOSIS — Z8249 Family history of ischemic heart disease and other diseases of the circulatory system: Secondary | ICD-10-CM

## 2018-08-13 DIAGNOSIS — Z79899 Other long term (current) drug therapy: Secondary | ICD-10-CM | POA: Insufficient documentation

## 2018-08-13 DIAGNOSIS — M797 Fibromyalgia: Secondary | ICD-10-CM | POA: Insufficient documentation

## 2018-08-13 DIAGNOSIS — M79605 Pain in left leg: Secondary | ICD-10-CM | POA: Insufficient documentation

## 2018-08-13 DIAGNOSIS — I82592 Chronic embolism and thrombosis of other specified deep vein of left lower extremity: Secondary | ICD-10-CM | POA: Diagnosis not present

## 2018-08-13 DIAGNOSIS — G4733 Obstructive sleep apnea (adult) (pediatric): Secondary | ICD-10-CM | POA: Diagnosis not present

## 2018-08-13 DIAGNOSIS — I110 Hypertensive heart disease with heart failure: Secondary | ICD-10-CM | POA: Insufficient documentation

## 2018-08-13 DIAGNOSIS — D6489 Other specified anemias: Secondary | ICD-10-CM | POA: Insufficient documentation

## 2018-08-13 DIAGNOSIS — M5416 Radiculopathy, lumbar region: Secondary | ICD-10-CM | POA: Diagnosis not present

## 2018-08-13 DIAGNOSIS — Z6841 Body Mass Index (BMI) 40.0 and over, adult: Secondary | ICD-10-CM | POA: Insufficient documentation

## 2018-08-13 DIAGNOSIS — F419 Anxiety disorder, unspecified: Secondary | ICD-10-CM | POA: Diagnosis not present

## 2018-08-13 DIAGNOSIS — G894 Chronic pain syndrome: Secondary | ICD-10-CM | POA: Insufficient documentation

## 2018-08-13 DIAGNOSIS — I82502 Chronic embolism and thrombosis of unspecified deep veins of left lower extremity: Secondary | ICD-10-CM

## 2018-08-13 DIAGNOSIS — Z7951 Long term (current) use of inhaled steroids: Secondary | ICD-10-CM | POA: Insufficient documentation

## 2018-08-13 DIAGNOSIS — F332 Major depressive disorder, recurrent severe without psychotic features: Secondary | ICD-10-CM | POA: Diagnosis not present

## 2018-08-13 DIAGNOSIS — D509 Iron deficiency anemia, unspecified: Secondary | ICD-10-CM | POA: Insufficient documentation

## 2018-08-13 DIAGNOSIS — I10 Essential (primary) hypertension: Secondary | ICD-10-CM | POA: Insufficient documentation

## 2018-08-13 DIAGNOSIS — I5032 Chronic diastolic (congestive) heart failure: Secondary | ICD-10-CM | POA: Insufficient documentation

## 2018-08-13 DIAGNOSIS — Z7901 Long term (current) use of anticoagulants: Secondary | ICD-10-CM | POA: Insufficient documentation

## 2018-08-13 DIAGNOSIS — M069 Rheumatoid arthritis, unspecified: Secondary | ICD-10-CM | POA: Insufficient documentation

## 2018-08-13 MED ORDER — ACETAMINOPHEN-CODEINE #4 300-60 MG PO TABS: 1.0000 | ORAL_TABLET | Freq: Three times a day (TID) | ORAL | 0 refills | Status: DC | PRN

## 2018-08-13 MED ORDER — FERROUS SULFATE 325 (65 FE) MG PO TABS: 325.0000 mg | ORAL_TABLET | Freq: Every day | ORAL | 3 refills | Status: DC

## 2018-08-13 NOTE — Patient Instructions (Signed)
You have been referred to a hematologist as discussed today.

## 2018-08-13 NOTE — Progress Notes (Signed)
Patient ID: Arron Mcnaught, female    DOB: 03-29-1961  MRN: [id number]  CC: Leg Pain   Subjective: Monnie Gudgel is a 57 y.o. female who presents for follow-up on left leg pain.   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  Since last visit with me she has been trying to get in with a new PCP.  Patient was last seen 06/29/2018 with pain and swelling in the left lower leg.  She was found to have a ruptured Baker's cyst and chronic clots in the peroneal vein.  Patient had called and requested referral to vein and vascular.  I submitted that referral but got a reply back from vein and vascular stating that they do not treat blood clot in the peroneal vein.  They recommended monitoring by PCP.  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  She is on Xarelto.  She reports that the pain and swelling in the left leg is less.  Reports that mother has history of blood clots in the lungs.  Seen by bariatric clinic at [facility] earlier this year.  She would like to have the gastric sleeve.  Reports being told that because she has had a hospitalization within the past year she would need to be monitored for at least a year and continue follow-up with a psychiatrist before coming back to them.  She has been compliant with their instructions and hopes to get back to them sometime next year.  Anemia:  Has problem finding 325 mg of ferrous sulfate.  She is requesting a prescription.  She has a patient assistance program form for the medication Adempas.  She needs to have it completed so that she can get this through the mail.    Patient Active Problem List   Diagnosis Date Noted    . Leg pain, left 07/10/2018  . Bilateral primary osteoarthritis of knee 05/12/2018  . Plantar fasciitis of left foot 05/12/2018  . Shortness of breath 01/16/2018  . Palliative care encounter 01/16/2018  . Major depressive disorder, recurrent severe without psychotic features ([place]) 12/06/2017  . CHF exacerbation ([place]) 08/21/2017  . (HFpEF) heart failure with preserved ejection fraction ([place]) 08/20/2017  . Lumbar radiculopathy 08/08/2017  . Body mass index 50.0-59.9, adult ([place]) 08/08/2017  . Hoarseness 08/08/2017  . Chronic cough 04/10/2017  . Rheumatoid arthritis involving multiple sites ([place]) 04/10/2017  . Anxiety and depression 04/10/2017  . Adjustment disorder with depressed mood 12/11/2016  . Morbid obesity ([place]) 08/14/2016  . Stroke ([place]) 06/27/2016  . Right sided weakness 06/27/2016  . Essential hypertension 06/27/2016  . Atypical chest pain 06/27/2016  . Difficult airway 03/29/2016  . CTEPH (chronic thromboembolic pulmonary hypertension) ([place]) 03/14/2016  . OSA on CPAP 02/20/2016  . Chronic pain syndrome 01/31/2016  . Pulmonary embolus ([place]) 11/15/2015  . Vertigo 11/15/2015  . Depression 11/15/2015     Current Outpatient Medications on File Prior to Visit  Medication Sig Dispense Refill  . ALPRAZolam (XANAX) 1 MG tablet Take 1 mg by mouth as needed for anxiety.    Marland Kitchen atorvastatin (LIPITOR) 20 MG tablet Take 1 tablet (20 mg total) by mouth daily at 6 PM. 90 tablet 3  . cyclobenzaprine (FLEXERIL) 5 MG  tablet TAKE 1 TABLET BY MOUTH TWICE DAILY AS NEEDED FOR MUSCLE SPASM 40 tablet 2  . docusate sodium (COLACE) 100 MG capsule Take 1 capsule (100 mg total) by mouth every 12 (twelve) hours. 30 capsule 0  . furosemide (LASIX) 40 MG tablet Take 1 tablet (40 mg total) by mouth daily. Take extra 40 mg tablet once in the afternoon AS NEEDED for weight gain 3 lbs or more. 180 tablet 3  . gabapentin (NEURONTIN) 300 MG capsule Take 2 capsules (600 mg total) by mouth 3 (three) times  daily. 540 capsule 3  . meclizine (ANTIVERT) 25 MG tablet Take 1 tablet (25 mg total) by mouth 2 (two) times daily as needed for dizziness. 60 tablet 0  . omeprazole (PRILOSEC) 40 MG capsule Take 1 capsule (40 mg total) by mouth 2 (two) times daily. 180 capsule 3  . polyethylene glycol powder (GLYCOLAX/MIRALAX) powder Take 17 g by mouth 2 (two) times daily as needed. 3350 g 1  . Riociguat (ADEMPAS) 1.5 MG TABS Take 1.5 mg by mouth 3 (three) times daily. 90 tablet 11  . rivaroxaban (XARELTO) 20 MG TABS tablet Take 1 tablet (20 mg total) by mouth daily with supper. 90 tablet 3  . spironolactone (ALDACTONE) 25 MG tablet Take 0.5 tablets (12.5 mg total) by mouth daily. 45 tablet 3  . traZODone (DESYREL) 100 MG tablet Take 100 mg by mouth at bedtime. Pt takes only as needed    . zolpidem (AMBIEN) 10 MG tablet Take 10 mg by mouth at bedtime as needed for sleep.    Marland Kitchen albuterol (PROVENTIL HFA;VENTOLIN HFA) 108 (90 Base) MCG/ACT inhaler Inhale 1-2 puffs into the lungs every 6 (six) hours as needed for wheezing. (Patient not taking: Reported on 07/06/2018) 3 Inhaler 6  . fluticasone (FLONASE) 50 MCG/ACT nasal spray Place 2 sprays into both nostrils daily. (Patient not taking: Reported on 07/06/2018) 16 g 6   No current facility-administered medications on file prior to visit.     No Known Allergies  Social History   Socioeconomic History  . Marital status: Married    Spouse name: Not on file  . Number of children: Not on file  . Years of education: Not on file  . Highest education level: Not on file  Occupational History  . Not on file  Social Needs  . Financial resource strain: Not on file  . Food insecurity:    Worry: Not on file    Inability: Not on file  . Transportation needs:    Medical: Not on file    Non-medical: Not on file  Tobacco Use  . Smoking status: Never Smoker  . Smokeless tobacco: Never Used  Substance and Sexual Activity  . Alcohol use: No  . Drug use: No  . Sexual  activity: Yes    Partners: Male    Birth control/protection: Surgical  Lifestyle  . Physical activity:    Days per week: Not on file    Minutes per session: Not on file  . Stress: Not on file  Relationships  . Social connections:    Talks on phone: Not on file    Gets together: Not on file    Attends religious service: Not on file    Active member of club or organization: Not on file    Attends meetings of clubs or organizations: Not on file    Relationship status: Not on file  . Intimate partner violence:    Fear of current or ex partner: Not  on file    Emotionally abused: Not on file    Physically abused: Not on file    Forced sexual activity: Not on file  Other Topics Concern  . Not on file  Social History Narrative  . Not on file    Family History  Problem Relation Age of Onset  . Lung cancer Maternal Grandmother   . Dementia Mother   . Dementia Father   . Anxiety disorder Sister   . Bipolar disorder Sister   . Drug abuse Sister   . Sexual abuse Maternal Aunt   . Anxiety disorder Cousin   . Anxiety disorder Sister   . Bipolar disorder Sister   . Drug abuse Sister   . Breast cancer Neg Hx     Past Surgical History:  Procedure Laterality Date  . ABDOMINAL HYSTERECTOMY    . BREAST REDUCTION SURGERY    . CHOLECYSTECTOMY    . EMBOLECTOMY N/A    pulmonary embolectomy  . REDUCTION MAMMAPLASTY Bilateral   . RIGHT HEART CATH N/A 10/28/2016   Procedure: Right Heart Cath;  Surgeon: Jolaine Artist, MD;  Location: [place] [facility];  Service: Cardiovascular;  Laterality: N/A;  . RIGHT HEART CATH N/A 08/26/2017   Procedure: RIGHT HEART CATH;  Surgeon: Jolaine Artist, MD;  Location: [place] [facility];  Service: Cardiovascular;  Laterality: N/A;  . ULTRASOUND GUIDANCE FOR VASCULAR ACCESS  08/26/2017   Procedure: Ultrasound Guidance For Vascular Access;  Surgeon: Jolaine Artist, MD;  Location: [place] [facility];  Service: Cardiovascular;;    ROS: Review  of Systems MSK: She is requesting refill on Tylenol #4 which she takes for chronic back pain.    PHYSICAL EXAM: BP 119/71   Pulse 98   Temp (!) 97.5 F (36.4 C) (Oral)   Ht 5\' 5"  (1.651 m)   Wt (!) 340 lb 3.2 oz (154.3 kg)   SpO2 93%   BMI 56.61 kg/m   Wt Readings from Last 3 Encounters:  08/13/18 (!) 340 lb 3.2 oz (154.3 kg)  07/01/18 (!) 345 lb 3.2 oz (156.6 kg)  06/29/18 (!) 341 lb 9.6 oz (154.9 kg)    Physical Exam General appearance - alert, well appearing, and in no distress Mental status - normal mood, behavior, speech, dress, motor activity, and thought processes Chest - clear to auscultation, no wheezes, rales or rhonchi, symmetric air entry Heart - normal rate, regular rhythm, normal S1, S2, no murmurs, rubs, clicks or gallops Extremities -trace edema in the left lower extremity  ASSESSMENT AND PLAN: 1. 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  The leg looks a lot better today and she reports that the pain is less.  She is wearing socks with good elasticity.  Given her weight we are not sure that Xarelto is as effective as it should be and we discussed changing to Coumadin.  Patient states she has been on Coumadin in the past and did not like the frequent sticks.  She is agreeable to seeing a hematologist for an opinion and because of her family history of blood clots.   - Ambulatory referral to Hematology  2. Family history of blood clots - Ambulatory referral to Hematology  3. Morbid obesity ([place]) Being evaluated for weight reduction surgery.  4. Iron deficiency anemia, unspecified iron deficiency anemia type - ferrous sulfate (FERROUSUL) 325 (65 FE) MG tablet; Take 1 tablet (325 mg total) by mouth daily with breakfast.  Dispense: 90 tablet;  Refill: 3  5. Lumbar radiculopathy NCCSRS reviewed - acetaminophen-codeine (TYLENOL #4) 300-60 MG tablet; Take 1 tablet by mouth every 8 (eight) hours as needed for moderate pain.  Dispense: 90 tablet; Refill: 0   Patient was given the opportunity to ask questions.  Patient verbalized understanding of the plan and was able to repeat key elements of the plan.   Orders Placed This Encounter  Procedures  . Ambulatory referral to Hematology     Requested Prescriptions   Signed Prescriptions Disp Refills  . acetaminophen-codeine (TYLENOL #4) 300-60 MG tablet 90 tablet 0    Sig: Take 1 tablet by mouth every 8 (eight) hours as needed for moderate pain.  . ferrous sulfate (FERROUSUL) 325 (65 FE) MG tablet 90 tablet 3    Sig: Take 1 tablet (325 mg total) by mouth daily with breakfast.    No follow-ups on file.  Karle Plumber, MD, FACP

## 2018-08-17 ENCOUNTER — Other Ambulatory Visit: Payer: Self-pay | Admitting: *Deleted

## 2018-08-17 NOTE — Patient Outreach (Signed)
[facility]) Care Management  08/17/2018  Gennesis Hogland [facility] June 19, 1961 [id number]    RN spoke with pt today and received an update on her ongiong management of care. Pt reports she is doing well and verified she continues to receive visit via para-medicine Karena Addison). Pt reports she continues to go to all her therapist and indicates she recent followed up with her primary provided on last Monday. Pt verified she has established a new primary provider but unable to visit until Feb 2020. Pt states she continue to manage her care with no reported issues from her HF. States she continues to have swelling to her LLE but  Has permission to take an extra "fluid pill" if needed. States her weight fluctuates but is able to manage her HF so far with her provider. Plan of care reviewed with some goals met and interventions adjusted accordingly to allow pt ongoing adherence with managing her care. RN will continue to stress the importance of lowering her risk for readmission and offer any needed resources at this time (none needed).   Plan to follow up in one month telephonically prior to possible graduating from the [facility] program and services. Pt agreed with this plan with no other concerns or inquires at this time.  THN CM Care Plan Problem One     Most Recent Value  Care Plan Problem One  Hospital admission preventions  Role Documenting the Problem One  Care Management [place] for Problem One  Active  THN Long Term Goal   Pt will avoid hospitalization with use of prevention measures over the next 90 days.  THN Long Term Goal Start Date  07/06/18  Interventions for Problem One Long Term Goal  Will inquire on pt's ongoing mangmement of care and continue to encourage pt on prevention measures and lowering her risk of readmission while managing her ongoing health condition. WIll inquired on needed resources or assistance needed for pt to continue her management of care.  THN CM  Short Term Goal #1   Pt will establish a new provider over the next 30 days .  THN CM Short Term Goal #1 Start Date  07/06/18  [facility] CM Short Term Goal #1 Met Date  08/17/18  Interventions for Short Term Goal #1  Will verify pt has established a new provider however informed the visit can not occur until Feb 2020 however pt reprots she continues to see her currently provider until that pending appointment with no reported issues or problems.       [name], RN Care Management Coordinator [place] Office [phone]

## 2018-08-18 ENCOUNTER — Other Ambulatory Visit (HOSPITAL_COMMUNITY): Payer: Self-pay

## 2018-08-18 ENCOUNTER — Telehealth (HOSPITAL_COMMUNITY): Payer: Self-pay

## 2018-08-18 NOTE — Telephone Encounter (Signed)
Pt called to confirm a time to have a CHP visit. Pt agrees on next Tuesday afternoon.

## 2018-08-18 NOTE — Telephone Encounter (Signed)
Pt text and asked for xarelto samples, she only has 7 left for the month.  She stated that she will be able to get more next month.   Erika from [facility] gave pt samples that I took to her.

## 2018-08-18 NOTE — Progress Notes (Signed)
Paramedicine Encounter    Patient ID: [name] [name], female    DOB: [birth year], 57 y.o.   MRN: [id number]    Patient Care Team: Ladell Pier, MD as PCP - General (Internal Medicine) Tobi Bastos, RN as [place] Management  Patient Active Problem List   Diagnosis Date Noted  . Leg pain, left 07/10/2018  . Bilateral primary osteoarthritis of knee 05/12/2018  . Plantar fasciitis of left foot 05/12/2018  . Shortness of breath 01/16/2018  . Palliative care encounter 01/16/2018  . Major depressive disorder, recurrent severe without psychotic features ([place]) 12/06/2017  . CHF exacerbation ([place]) 08/21/2017  . (HFpEF) heart failure with preserved ejection fraction ([place]) 08/20/2017  . Lumbar radiculopathy 08/08/2017  . Body mass index 50.0-59.9, adult ([place]) 08/08/2017  . Hoarseness 08/08/2017  . Chronic cough 04/10/2017  . Rheumatoid arthritis involving multiple sites ([place]) 04/10/2017  . Anxiety and depression 04/10/2017  . Adjustment disorder with depressed mood 12/11/2016  . Morbid obesity ([place]) 08/14/2016  . Stroke ([place]) 06/27/2016  . Right sided weakness 06/27/2016  . Essential hypertension 06/27/2016  . Atypical chest pain 06/27/2016  . Difficult airway 03/29/2016  . CTEPH (chronic thromboembolic pulmonary hypertension) ([place]) 03/14/2016  . OSA on CPAP 02/20/2016  . Chronic pain syndrome 01/31/2016  . Pulmonary embolus ([place]) 11/15/2015  . Vertigo 11/15/2015  . Depression 11/15/2015    Current Outpatient Medications:  .  acetaminophen-codeine (TYLENOL #4) 300-60 MG tablet, Take 1 tablet by mouth every 8 (eight) hours as needed for moderate pain., Disp: 90 tablet, Rfl: 0 .  albuterol (PROVENTIL HFA;VENTOLIN HFA) 108 (90 Base) MCG/ACT inhaler, Inhale 1-2 puffs into the lungs every 6 (six) hours as needed for wheezing. (Patient not taking: Reported on 07/06/2018), Disp: 3 Inhaler, Rfl: 6 .  ALPRAZolam (XANAX) 1 MG tablet, Take 1 mg by mouth as  needed for anxiety., Disp: , Rfl:  .  atorvastatin (LIPITOR) 20 MG tablet, Take 1 tablet (20 mg total) by mouth daily at 6 PM., Disp: 90 tablet, Rfl: 3 .  cyclobenzaprine (FLEXERIL) 5 MG tablet, TAKE 1 TABLET BY MOUTH TWICE DAILY AS NEEDED FOR MUSCLE SPASM, Disp: 40 tablet, Rfl: 2 .  docusate sodium (COLACE) 100 MG capsule, Take 1 capsule (100 mg total) by mouth every 12 (twelve) hours., Disp: 30 capsule, Rfl: 0 .  ferrous sulfate (FERROUSUL) 325 (65 FE) MG tablet, Take 1 tablet (325 mg total) by mouth daily with breakfast., Disp: 90 tablet, Rfl: 3 .  fluticasone (FLONASE) 50 MCG/ACT nasal spray, Place 2 sprays into both nostrils daily. (Patient not taking: Reported on 07/06/2018), Disp: 16 g, Rfl: 6 .  furosemide (LASIX) 40 MG tablet, Take 1 tablet (40 mg total) by mouth daily. Take extra 40 mg tablet once in the afternoon AS NEEDED for weight gain 3 lbs or more., Disp: 180 tablet, Rfl: 3 .  gabapentin (NEURONTIN) 300 MG capsule, Take 2 capsules (600 mg total) by mouth 3 (three) times daily., Disp: 540 capsule, Rfl: 3 .  meclizine (ANTIVERT) 25 MG tablet, Take 1 tablet (25 mg total) by mouth 2 (two) times daily as needed for dizziness., Disp: 60 tablet, Rfl: 0 .  omeprazole (PRILOSEC) 40 MG capsule, Take 1 capsule (40 mg total) by mouth 2 (two) times daily., Disp: 180 capsule, Rfl: 3 .  polyethylene glycol powder (GLYCOLAX/MIRALAX) powder, Take 17 g by mouth 2 (two) times daily as needed., Disp: 3350 g, Rfl: 1 .  Riociguat (ADEMPAS) 1.5 MG TABS, Take 1.5 mg by  mouth 3 (three) times daily., Disp: 90 tablet, Rfl: 11 .  rivaroxaban (XARELTO) 20 MG TABS tablet, Take 1 tablet (20 mg total) by mouth daily with supper., Disp: 90 tablet, Rfl: 3 .  spironolactone (ALDACTONE) 25 MG tablet, Take 0.5 tablets (12.5 mg total) by mouth daily., Disp: 45 tablet, Rfl: 3 .  traZODone (DESYREL) 100 MG tablet, Take 100 mg by mouth at bedtime. Pt takes only as needed, Disp: , Rfl:  .  zolpidem (AMBIEN) 10 MG tablet, Take  10 mg by mouth at bedtime as needed for sleep., Disp: , Rfl:  No Known Allergies   Social History   Socioeconomic History  . Marital status: Married    Spouse name: Not on file  . Number of children: Not on file  . Years of education: Not on file  . Highest education level: Not on file  Occupational History  . Not on file  Social Needs  . Financial resource strain: Not on file  . Food insecurity:    Worry: Not on file    Inability: Not on file  . Transportation needs:    Medical: Not on file    Non-medical: Not on file  Tobacco Use  . Smoking status: Never Smoker  . Smokeless tobacco: Never Used  Substance and Sexual Activity  . Alcohol use: No  . Drug use: No  . Sexual activity: Yes    Partners: Male    Birth control/protection: Surgical  Lifestyle  . Physical activity:    Days per week: Not on file    Minutes per session: Not on file  . Stress: Not on file  Relationships  . Social connections:    Talks on phone: Not on file    Gets together: Not on file    Attends religious service: Not on file    Active member of club or organization: Not on file    Attends meetings of clubs or organizations: Not on file    Relationship status: Not on file  . Intimate partner violence:    Fear of current or ex partner: Not on file    Emotionally abused: Not on file    Physically abused: Not on file    Forced sexual activity: Not on file  Other Topics Concern  . Not on file  Social History Narrative  . Not on file    Physical Exam  Pulmonary/Chest: Effort normal. No respiratory distress. She has no wheezes. She has no rales.  Musculoskeletal: She exhibits no edema.  Skin: Skin is warm and dry. She is not diaphoretic.        Future Appointments  Date Time Provider [place]  08/26/2018  2:45 PM [name], MD [facility] None  08/28/2018 11:00 AM [facility] ROOM [facility] None  09/18/2018 10:00 AM Tobi Bastos, RN [facility] None     Pulse 91   Resp  12   Wt (!) 340 lb (154.2 kg)   SpO2 98%   BMI 56.58 kg/m   BP 118/p  Weight yesterday-didn't weigh Last visit weight-345 (clinic appt 10/23)  ATF pt CAO sitting on her walker. Pt stated that she has "good days and bad days". She stated that she is still seeing her therapist and pshycologist.  Pt stated that she is hopeful to restart the weight loss program in April 2020. She denies sob, chest pain and dizziness. Pt is using the CPAP nightly.  Pt stated that she has been taking her medications as prescribed/their in her bedroom, im  unable to verify.   Pt and I agree to visit again next month.   Elton Heid, EMT Paramedic [phone] 08/18/2018    ACTION: Home visit completed

## 2018-08-24 ENCOUNTER — Telehealth: Payer: Self-pay | Admitting: Internal Medicine

## 2018-08-24 ENCOUNTER — Encounter: Payer: Self-pay | Admitting: Internal Medicine

## 2018-08-24 NOTE — Telephone Encounter (Signed)
A new hematology appt has been scheduled for the pt to see Dr. Walden Field on 09/25/18 at 1050am. Letter mailed to the pt.

## 2018-08-26 ENCOUNTER — Ambulatory Visit: Payer: Self-pay | Admitting: Pulmonary Disease

## 2018-08-28 ENCOUNTER — Encounter (HOSPITAL_COMMUNITY)
Admission: RE | Admit: 2018-08-28 | Discharge: 2018-08-28 | Disposition: A | Discharge status: Home / Self Care | Payer: Medicare HMO | Source: Ambulatory Visit | Attending: Internal Medicine | Admitting: Internal Medicine

## 2018-08-28 DIAGNOSIS — I2782 Chronic pulmonary embolism: Secondary | ICD-10-CM | POA: Diagnosis not present

## 2018-08-28 DIAGNOSIS — I5022 Chronic systolic (congestive) heart failure: Secondary | ICD-10-CM | POA: Diagnosis not present

## 2018-08-28 DIAGNOSIS — Z452 Encounter for adjustment and management of vascular access device: Secondary | ICD-10-CM | POA: Insufficient documentation

## 2018-08-28 MED ORDER — HEPARIN SOD (PORK) LOCK FLUSH 100 UNIT/ML IV SOLN
INTRAVENOUS | Status: AC
  Filled 2018-08-28: qty 5

## 2018-08-28 MED ORDER — SODIUM CHLORIDE 0.9% FLUSH
10.0000 mL | Freq: Once | INTRAVENOUS | Status: AC
  Administered 2018-08-28: 10 mL via INTRAVENOUS

## 2018-08-28 MED ORDER — HEPARIN SOD (PORK) LOCK FLUSH 100 UNIT/ML IV SOLN
500.0000 [IU] | INTRAVENOUS | Status: AC
  Administered 2018-08-28: 500 [IU] via INTRAVENOUS

## 2018-08-28 NOTE — Progress Notes (Signed)
Port accessed today, no blood return, flushed without difficulty, pt could "taste the saline", no resistance met.  Pt unable to stay today for tpa instillation, as suggested.  Pt will return for next appointment in January and re-evaluate then.  Flushed with 58m NS and 574mheparin.

## 2018-09-07 ENCOUNTER — Other Ambulatory Visit: Payer: Self-pay | Admitting: Internal Medicine

## 2018-09-13 ENCOUNTER — Other Ambulatory Visit: Payer: Self-pay | Admitting: Internal Medicine

## 2018-09-13 DIAGNOSIS — M5442 Lumbago with sciatica, left side: Secondary | ICD-10-CM

## 2018-09-15 DIAGNOSIS — F332 Major depressive disorder, recurrent severe without psychotic features: Secondary | ICD-10-CM | POA: Diagnosis not present

## 2018-09-17 ENCOUNTER — Encounter: Payer: Self-pay | Admitting: Internal Medicine

## 2018-09-17 ENCOUNTER — Ambulatory Visit: Payer: Medicare HMO | Attending: Internal Medicine | Admitting: Internal Medicine

## 2018-09-17 VITALS — BP 101/71 | HR 84 | Temp 98.6°F | Resp 16 | Ht 63.0 in | Wt 343.2 lb

## 2018-09-17 DIAGNOSIS — M5416 Radiculopathy, lumbar region: Secondary | ICD-10-CM

## 2018-09-17 DIAGNOSIS — B9789 Other viral agents as the cause of diseases classified elsewhere: Secondary | ICD-10-CM | POA: Diagnosis not present

## 2018-09-17 DIAGNOSIS — J069 Acute upper respiratory infection, unspecified: Secondary | ICD-10-CM | POA: Diagnosis not present

## 2018-09-17 MED ORDER — BENZONATATE 100 MG PO CAPS: 100.0000 mg | ORAL_CAPSULE | Freq: Two times a day (BID) | ORAL | 0 refills | Status: DC | PRN

## 2018-09-17 MED ORDER — OMEPRAZOLE 40 MG PO CPDR: 40.0000 mg | DELAYED_RELEASE_CAPSULE | Freq: Two times a day (BID) | ORAL | 1 refills | Status: DC

## 2018-09-17 MED ORDER — ACETAMINOPHEN-CODEINE #4 300-60 MG PO TABS: 1.0000 | ORAL_TABLET | Freq: Three times a day (TID) | ORAL | 0 refills | Status: DC | PRN

## 2018-09-17 MED ORDER — FUROSEMIDE 40 MG PO TABS: 40.0000 mg | ORAL_TABLET | Freq: Every day | ORAL | 1 refills | Status: DC

## 2018-09-17 MED ORDER — ATORVASTATIN CALCIUM 20 MG PO TABS: 20.0000 mg | ORAL_TABLET | Freq: Every day | ORAL | 1 refills | Status: AC

## 2018-09-17 MED ORDER — GABAPENTIN 300 MG PO CAPS: 600.0000 mg | ORAL_CAPSULE | Freq: Three times a day (TID) | ORAL | 1 refills | Status: DC

## 2018-09-17 NOTE — Progress Notes (Signed)
Patient ID: [name] [name], female    DOB: 09-16-1960  MRN: [id number]  CC: Generalized Body Aches; Cough; and Nasal Congestion   Subjective: [name] [name] is a 58 y.o. female who presents for UC visit.  Husband is with her. 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  Symptoms include congestion, tiredness and cough productive of thick brown mucus and some shortness of breath.  She had a fever of 101 a few days ago.  She has been taking Mucinex, TheraFlu hot tea and Tylenol.  Her mother who lives with her has also been sick.  Patient reports that she is now doing better but still wanted to come and have it checked.  Request refills on most meds prescribed by me including Tyl #4 . Patient Active Problem List   Diagnosis Date Noted  . Leg pain, left 07/10/2018  . Bilateral primary osteoarthritis of knee 05/12/2018  . Plantar fasciitis of left foot 05/12/2018  . Shortness of breath 01/16/2018  . Palliative care encounter 01/16/2018  . Major depressive disorder, recurrent severe without psychotic features ([place]) 12/06/2017  . CHF exacerbation ([place]) 08/21/2017  . (HFpEF) heart failure with preserved ejection fraction ([place]) 08/20/2017  . Lumbar radiculopathy 08/08/2017  . Body mass index 50.0-59.9, adult ([place]) 08/08/2017  . Hoarseness 08/08/2017  . Chronic cough 04/10/2017  . Rheumatoid arthritis involving multiple sites ([place]) 04/10/2017  . Anxiety and depression 04/10/2017  . Adjustment disorder with depressed mood 12/11/2016  . Morbid obesity ([place]) 08/14/2016  . Stroke ([place]) 06/27/2016  . Right sided weakness 06/27/2016  .  Essential hypertension 06/27/2016  . Atypical chest pain 06/27/2016  . Difficult airway 03/29/2016  . CTEPH (chronic thromboembolic pulmonary hypertension) ([place]) 03/14/2016  . OSA on CPAP 02/20/2016  . Chronic pain syndrome 01/31/2016  . Pulmonary embolus ([place]) 11/15/2015  . Vertigo 11/15/2015  . Depression 11/15/2015     Current Outpatient Medications on File Prior to Visit  Medication Sig Dispense Refill  . albuterol (PROVENTIL HFA;VENTOLIN HFA) 108 (90 Base) MCG/ACT inhaler Inhale 1-2 puffs into the lungs every 6 (six) hours as needed for wheezing. (Patient not taking: Reported on 07/06/2018) 3 Inhaler 6  . ALPRAZolam (XANAX) 1 MG tablet Take 1 mg by mouth as needed for anxiety.    . cyclobenzaprine (FLEXERIL) 5 MG tablet TAKE 1 TABLET BY MOUTH TWICE DAILY AS NEEDED FOR MUSCLE SPASM 40 tablet 2  . docusate sodium (COLACE) 100 MG capsule Take 1 capsule (100 mg total) by mouth every 12 (twelve) hours. 30 capsule 0  . ferrous sulfate (FERROUSUL) 325 (65 FE) MG tablet Take 1 tablet (325 mg total) by mouth daily with breakfast. 90 tablet 3  . fluticasone (FLONASE) 50 MCG/ACT nasal spray Place 2 sprays into both nostrils daily. (Patient not taking: Reported on 07/06/2018) 16 g 6  . meclizine (ANTIVERT) 25 MG tablet TAKE 1 TABLET BY MOUTH TWICE DAILY AS NEEDED FOR  DIZZINESS 60 tablet 0  . polyethylene glycol powder (GLYCOLAX/MIRALAX) powder Take 17 g by mouth 2 (two) times daily as needed. 3350 g 1  . Riociguat (ADEMPAS) 1.5 MG TABS Take 1.5 mg by mouth 3 (three) times daily. 90 tablet 11  . rivaroxaban (XARELTO) 20  MG TABS tablet Take 1 tablet (20 mg total) by mouth daily with supper. 90 tablet 3  . spironolactone (ALDACTONE) 25 MG tablet Take 0.5 tablets (12.5 mg total) by mouth daily. 45 tablet 3  . traZODone (DESYREL) 100 MG tablet Take 100 mg by mouth at bedtime. Pt takes only as needed    . zolpidem (AMBIEN) 10 MG tablet Take 10 mg by mouth at bedtime as needed for sleep.     No current  facility-administered medications on file prior to visit.     No Known Allergies  Social History   Socioeconomic History  . Marital status: Married    Spouse name: Not on file  . Number of children: Not on file  . Years of education: Not on file  . Highest education level: Not on file  Occupational History  . Not on file  Social Needs  . Financial resource strain: Not on file  . Food insecurity:    Worry: Not on file    Inability: Not on file  . Transportation needs:    Medical: Not on file    Non-medical: Not on file  Tobacco Use  . Smoking status: Never Smoker  . Smokeless tobacco: Never Used  Substance and Sexual Activity  . Alcohol use: No  . Drug use: No  . Sexual activity: Yes    Partners: Male    Birth control/protection: Surgical  Lifestyle  . Physical activity:    Days per week: Not on file    Minutes per session: Not on file  . Stress: Not on file  Relationships  . Social connections:    Talks on phone: Not on file    Gets together: Not on file    Attends religious service: Not on file    Active member of club or organization: Not on file    Attends meetings of clubs or organizations: Not on file    Relationship status: Not on file  . Intimate partner violence:    Fear of current or ex partner: Not on file    Emotionally abused: Not on file    Physically abused: Not on file    Forced sexual activity: Not on file  Other Topics Concern  . Not on file  Social History Narrative  . Not on file    Family History  Problem Relation Age of Onset  . Lung cancer Maternal Grandmother   . Dementia Mother   . Dementia Father   . Anxiety disorder Sister   . Bipolar disorder Sister   . Drug abuse Sister   . Sexual abuse Maternal Aunt   . Anxiety disorder Cousin   . Anxiety disorder Sister   . Bipolar disorder Sister   . Drug abuse Sister   . Breast cancer Neg Hx     Past Surgical History:  Procedure Laterality Date  . ABDOMINAL HYSTERECTOMY    .  BREAST REDUCTION SURGERY    . CHOLECYSTECTOMY    . EMBOLECTOMY N/A    pulmonary embolectomy  . REDUCTION MAMMAPLASTY Bilateral   . RIGHT HEART CATH N/A 10/28/2016   Procedure: Right Heart Cath;  Surgeon: Jolaine Artist, MD;  Location: [facility];  Service: Cardiovascular;  Laterality: N/A;  . RIGHT HEART CATH N/A 08/26/2017   Procedure: RIGHT HEART CATH;  Surgeon: Jolaine Artist, MD;  Location: [facility];  Service: Cardiovascular;  Laterality: N/A;  . ULTRASOUND GUIDANCE FOR VASCULAR ACCESS  08/26/2017   Procedure: Ultrasound Guidance For  Vascular Access;  Surgeon: Jolaine Artist, MD;  Location: [facility];  Service: Cardiovascular;;    ROS: Review of Systems Negative except as above PHYSICAL EXAM: BP 101/71   Pulse 84   Temp 98.6 F (37 C) (Oral)   Resp 16   Ht 5\' 3"  (1.6 m)   Wt (!) 343 lb 3.2 oz (155.7 kg)   SpO2 96%   BMI 60.80 kg/m   Physical Exam  General appearance - alert, well appearing, morbidly obese African-American female and in no distress Mental status - normal mood, behavior, speech, dress, motor activity, and thought processes Nose - normal and patent, no erythema, discharge or polyps Mouth - mucous membranes moist, pharynx normal without lesions Neck - supple, no significant adenopathy Chest - clear to auscultation, no wheezes, rales or rhonchi, symmetric air entry Heart - normal rate, regular rhythm, normal S1, S2, no murmurs, rubs, clicks or gallops   ASSESSMENT AND PLAN: 1. Viral upper respiratory tract infection Patient appears to be on the tail end of upper respiratory infection.  Advised that it can take several weeks for cough to resolve.  I have given some Tessalon Perles to use as needed.  Taking a teaspoon of honey as needed can also help with cough. - benzonatate (TESSALON) 100 MG capsule; Take 1 capsule (100 mg total) by mouth 2 (two) times daily as needed for cough.  Dispense: 20 capsule; Refill: 0  2.  Lumbar radiculopathy 1 refill given on Tylenol #4. last prescription generated August 13, 2018. NCCSRS reviewed - gabapentin (NEURONTIN) 300 MG capsule; Take 2 capsules (600 mg total) by mouth 3 (three) times daily.  Dispense: 540 capsule; Refill: 1 - acetaminophen-codeine (TYLENOL #4) 300-60 MG tablet; Take 1 tablet by mouth every 8 (eight) hours as needed for moderate pain.  Dispense: 90 tablet; Refill: 0  Patient was given the opportunity to ask questions.  Patient verbalized understanding of the plan and was able to repeat key elements of the plan.   No orders of the defined types were placed in this encounter.    Requested Prescriptions   Signed Prescriptions Disp Refills  . benzonatate (TESSALON) 100 MG capsule 20 capsule 0    Sig: Take 1 capsule (100 mg total) by mouth 2 (two) times daily as needed for cough.  Marland Kitchen atorvastatin (LIPITOR) 20 MG tablet 90 tablet 1    Sig: Take 1 tablet (20 mg total) by mouth daily at 6 PM.  . omeprazole (PRILOSEC) 40 MG capsule 180 capsule 1    Sig: Take 1 capsule (40 mg total) by mouth 2 (two) times daily.  . furosemide (LASIX) 40 MG tablet 180 tablet 1    Sig: Take 1 tablet (40 mg total) by mouth daily. Take extra 40 mg tablet once in the afternoon AS NEEDED for weight gain 3 lbs or more.  . gabapentin (NEURONTIN) 300 MG capsule 540 capsule 1    Sig: Take 2 capsules (600 mg total) by mouth 3 (three) times daily.  Marland Kitchen acetaminophen-codeine (TYLENOL #4) 300-60 MG tablet 90 tablet 0    Sig: Take 1 tablet by mouth every 8 (eight) hours as needed for moderate pain.    No follow-ups on file.  Karle Plumber, MD, FACP

## 2018-09-17 NOTE — Patient Instructions (Signed)

## 2018-09-17 NOTE — Progress Notes (Signed)
Pt states she twisted her left ankle yesterday but its not bad

## 2018-09-18 ENCOUNTER — Encounter: Payer: Self-pay | Admitting: *Deleted

## 2018-09-18 ENCOUNTER — Other Ambulatory Visit: Payer: Self-pay | Admitting: *Deleted

## 2018-09-18 NOTE — Patient Outreach (Signed)
[facility]) Care Management  09/18/2018  [name] [name] [facility] Oct 04, 1960 [id number]    RN spoke with the pt today and received an update on the current plan of care. Pt reports she has several pending appointments with potential primary care providers but has not really establish the one provider for her ongoing management of care. Pt states her initial visit in 1/29 and she will decide if that establishment is qualified to meet her expectation for her new provider. Will incorporate this into the pt's current plan of care and adjust intervention accordingly pending this outcome. Will also verify pt continues to manage her care will no acute problems or related issues.  Based upon the pt's progress will follow up next month telephonically and plan to discharge if pt has again established the initial office visit for her new primary provide and continues to use prevention measures that will risk hospitalization.  [facility] CM Care Plan Problem One     Most Recent Value  Care Plan Problem One  Hospital admission preventions  Role Documenting the Problem One  Care Management [place] for Problem One  Active  THN Long Term Goal   Pt will avoid hospitalization with use of prevention measures over the next 90 days.  [facility]N Long Term Goal Start Date  07/06/18  Interventions for Problem One Long Term Goal  Will allow pt time to establish the initial office visit with her new provider to prevent risk of acute medical issues pending this month. Will continue to encourage pt on early interventions to prevent acute issues from occurring.  [Pt seeking several pdg apts with PCP offices]     [name], RN Care Management Coordinator [place] Office [phone]

## 2018-09-25 ENCOUNTER — Ambulatory Visit: Payer: Self-pay

## 2018-09-25 ENCOUNTER — Encounter: Payer: Self-pay | Admitting: Internal Medicine

## 2018-09-28 ENCOUNTER — Other Ambulatory Visit: Payer: Self-pay | Admitting: Internal Medicine

## 2018-09-28 MED ORDER — MECLIZINE HCL 25 MG PO TABS: ORAL_TABLET | ORAL | 0 refills | Status: DC

## 2018-09-28 NOTE — Telephone Encounter (Signed)
.  1) Medication(s) Requested (by name): Meclizine  2) Pharmacy of Choice: [facility] on [address] 3) Special Requests:   Approved medications will be sent to the pharmacy, we will reach out if there is an issue.  Requests made after 3pm may not be addressed until the following business day!  If a patient is unsure of the name of the medication(s) please note and ask patient to call back when they are able to provide all info, do not send to responsible party until all information is available!

## 2018-09-30 ENCOUNTER — Ambulatory Visit: Payer: Self-pay | Admitting: Pulmonary Disease

## 2018-10-05 ENCOUNTER — Encounter (HOSPITAL_COMMUNITY)
Admission: RE | Admit: 2018-10-05 | Discharge: 2018-10-05 | Disposition: A | Discharge status: Home / Self Care | Payer: Medicare HMO | Source: Ambulatory Visit | Attending: Internal Medicine | Admitting: Internal Medicine

## 2018-10-05 ENCOUNTER — Encounter (HOSPITAL_COMMUNITY): Payer: Self-pay

## 2018-10-05 DIAGNOSIS — I2782 Chronic pulmonary embolism: Secondary | ICD-10-CM | POA: Insufficient documentation

## 2018-10-05 DIAGNOSIS — I5022 Chronic systolic (congestive) heart failure: Secondary | ICD-10-CM | POA: Diagnosis not present

## 2018-10-05 DIAGNOSIS — Z452 Encounter for adjustment and management of vascular access device: Secondary | ICD-10-CM | POA: Diagnosis not present

## 2018-10-05 MED ORDER — HEPARIN SOD (PORK) LOCK FLUSH 100 UNIT/ML IV SOLN
500.0000 [IU] | INTRAVENOUS | Status: DC
  Filled 2018-10-05: qty 5

## 2018-10-05 MED ORDER — SODIUM CHLORIDE 0.9% FLUSH: 10.0000 mL | INTRAVENOUS | Status: DC

## 2018-10-07 DIAGNOSIS — Z008 Encounter for other general examination: Secondary | ICD-10-CM | POA: Diagnosis not present

## 2018-10-07 DIAGNOSIS — I10 Essential (primary) hypertension: Secondary | ICD-10-CM | POA: Diagnosis not present

## 2018-10-07 DIAGNOSIS — F322 Major depressive disorder, single episode, severe without psychotic features: Secondary | ICD-10-CM | POA: Diagnosis not present

## 2018-10-07 DIAGNOSIS — Z0000 Encounter for general adult medical examination without abnormal findings: Secondary | ICD-10-CM | POA: Diagnosis not present

## 2018-10-07 DIAGNOSIS — I272 Pulmonary hypertension, unspecified: Secondary | ICD-10-CM | POA: Diagnosis not present

## 2018-10-07 DIAGNOSIS — Z6841 Body Mass Index (BMI) 40.0 and over, adult: Secondary | ICD-10-CM | POA: Diagnosis not present

## 2018-10-07 DIAGNOSIS — I509 Heart failure, unspecified: Secondary | ICD-10-CM | POA: Diagnosis not present

## 2018-10-07 DIAGNOSIS — J9610 Chronic respiratory failure, unspecified whether with hypoxia or hypercapnia: Secondary | ICD-10-CM | POA: Diagnosis not present

## 2018-10-13 ENCOUNTER — Telehealth (HOSPITAL_COMMUNITY): Payer: Self-pay

## 2018-10-13 NOTE — Telephone Encounter (Signed)
I called pt and we discussed her being discharged from the [facility] program.  She has done great with maintaining or losing weight. she understands her crossabilities and has a pcp. She recently reached out to [facility] for weight loss options including surgery.  She continues to see her therapists and psychiatrist.  She was encouraged to call [facility] heart failure team or myself if she has any issues or concerns.

## 2018-10-19 ENCOUNTER — Ambulatory Visit: Payer: Self-pay | Admitting: Pulmonary Disease

## 2018-10-22 ENCOUNTER — Other Ambulatory Visit (HOSPITAL_COMMUNITY): Payer: Self-pay

## 2018-10-22 ENCOUNTER — Telehealth (HOSPITAL_COMMUNITY): Payer: Self-pay

## 2018-10-22 MED ORDER — RIOCIGUAT 1.5 MG PO TABS: 1.5000 mg | ORAL_TABLET | Freq: Three times a day (TID) | ORAL | 11 refills | Status: DC

## 2018-10-22 NOTE — Telephone Encounter (Signed)
Refill request sent to [facility] for [place] refill with DB signature

## 2018-10-23 ENCOUNTER — Other Ambulatory Visit: Payer: Self-pay | Admitting: *Deleted

## 2018-10-23 NOTE — Patient Outreach (Signed)
[facility]) Care Management  10/23/2018  [name] [name] [facility] 03-10-1961 [id number]    Spoke with pt today with update on her establishment of a new primary provider. Pt reports the office visit and indicated she would confirm this as her new primary provider and inform Dr. [name] (former primary provider). Plan of care reviewed pending this goal that now has been met. Will confirm no additional needs at this time and close this case as pt will be graduating from the [facility] services at this time. RN will update pt's current primary provider and CMA for case closure.   [name], RN Care Management Coordinator [place] Office [phone]

## 2018-11-02 ENCOUNTER — Encounter (HOSPITAL_COMMUNITY)
Admission: RE | Admit: 2018-11-02 | Discharge: 2018-11-02 | Disposition: A | Discharge status: Home / Self Care | Payer: Medicare HMO | Source: Ambulatory Visit | Attending: Internal Medicine | Admitting: Internal Medicine

## 2018-11-02 ENCOUNTER — Telehealth: Payer: Self-pay | Admitting: Internal Medicine

## 2018-11-02 DIAGNOSIS — I5022 Chronic systolic (congestive) heart failure: Secondary | ICD-10-CM | POA: Insufficient documentation

## 2018-11-02 DIAGNOSIS — I2782 Chronic pulmonary embolism: Secondary | ICD-10-CM | POA: Diagnosis not present

## 2018-11-02 DIAGNOSIS — Z452 Encounter for adjustment and management of vascular access device: Secondary | ICD-10-CM | POA: Diagnosis not present

## 2018-11-02 LAB — COMPREHENSIVE METABOLIC PANEL
ALT: 11 U/L (ref 0–44)
AST: 15 U/L (ref 15–41)
Albumin: 3.7 g/dL (ref 3.5–5.0)
Alkaline Phosphatase: 68 U/L (ref 38–126)
Anion gap: 7 (ref 5–15)
BUN: 17 mg/dL (ref 6–20)
CO2: 27 mmol/L (ref 22–32)
Calcium: 9.2 mg/dL (ref 8.9–10.3)
Chloride: 109 mmol/L (ref 98–111)
Creatinine, Ser: 0.99 mg/dL (ref 0.44–1.00)
GFR calc Af Amer: >60 mL/min (ref >60)
GFR calc non Af Amer: >60 mL/min (ref >60)
Glucose, Bld: 115 mg/dL — ABNORMAL HIGH (ref 70–99)
Potassium: 3.7 mmol/L (ref 3.5–5.1)
Sodium: 143 mmol/L (ref 135–145)
Total Bilirubin: 0.5 mg/dL (ref 0.3–1.2)
Total Protein: 7 g/dL (ref 6.5–8.1)

## 2018-11-02 LAB — TSH: TSH: 0.638 u[IU]/mL (ref 0.350–4.500)

## 2018-11-02 LAB — CBC WITH DIFFERENTIAL/PLATELET
Abs Immature Granulocytes: 0.1 10*3/uL — ABNORMAL HIGH (ref 0.00–0.07)
Basophils Absolute: 0 10*3/uL (ref 0.0–0.1)
Basophils Relative: 0 %
Eosinophils Absolute: 0 10*3/uL (ref 0.0–0.5)
Eosinophils Relative: 0 %
HCT: 38.2 % (ref 36.0–46.0)
Hemoglobin: 11.8 g/dL — ABNORMAL LOW (ref 12.0–15.0)
Immature Granulocytes: 1 %
Lymphocytes Relative: 21 %
Lymphs Abs: 2.2 10*3/uL (ref 0.7–4.0)
MCH: 21.4 pg — ABNORMAL LOW (ref 26.0–34.0)
MCHC: 30.9 g/dL (ref 30.0–36.0)
MCV: 69.3 fL — ABNORMAL LOW (ref 80.0–100.0)
Monocytes Absolute: 0.7 10*3/uL (ref 0.1–1.0)
Monocytes Relative: 6 %
Neutro Abs: 7.7 10*3/uL (ref 1.7–7.7)
Neutrophils Relative %: 72 %
Platelets: 292 10*3/uL (ref 150–400)
RBC: 5.51 MIL/uL — ABNORMAL HIGH (ref 3.87–5.11)
RDW: 19.9 % — ABNORMAL HIGH (ref 11.5–15.5)
WBC: 10.7 10*3/uL — ABNORMAL HIGH (ref 4.0–10.5)
nRBC: 0 % (ref 0.0–0.2)

## 2018-11-02 LAB — LIPID PANEL
Cholesterol: 113 mg/dL (ref 0–200)
HDL: 35 mg/dL — ABNORMAL LOW (ref >40)
LDL Cholesterol: 67 mg/dL (ref 0–99)
Total CHOL/HDL Ratio: 3.2 RATIO
Triglycerides: 55 mg/dL (ref <150)
VLDL: 11 mg/dL (ref 0–40)

## 2018-11-02 LAB — VITAMIN B12: Vitamin B-12: 843 pg/mL (ref 180–914)

## 2018-11-02 LAB — FOLATE: Folate: 4.6 ng/mL — ABNORMAL LOW (ref >5.9)

## 2018-11-02 LAB — MAGNESIUM: Magnesium: 1.9 mg/dL (ref 1.7–2.4)

## 2018-11-02 LAB — HEMOGLOBIN A1C
Hgb A1c MFr Bld: 6.5 % — ABNORMAL HIGH (ref 4.8–5.6)
Mean Plasma Glucose: 139.85 mg/dL

## 2018-11-02 MED ORDER — HEPARIN SOD (PORK) LOCK FLUSH 100 UNIT/ML IV SOLN
500.0000 [IU] | INTRAVENOUS | Status: DC
  Administered 2018-11-02: 500 [IU] via INTRAVENOUS
  Filled 2018-11-02: qty 5

## 2018-11-02 MED ORDER — SODIUM CHLORIDE 0.9% FLUSH
10.0000 mL | INTRAVENOUS | Status: DC
  Administered 2018-11-02: 10 mL via INTRAVENOUS

## 2018-11-02 NOTE — Progress Notes (Signed)
Pt here for port flush today.  Pt brought orders also to draw several labs for her MD also.  These were drawn when her port was accessed and sent to lab.

## 2018-11-03 LAB — T4: T4, Total: 7 ug/dL (ref 4.5–12.0)

## 2018-11-03 NOTE — Telephone Encounter (Signed)
Contacted pt and pt states she goes to [facility] will fax lab over to new pcp

## 2018-11-30 ENCOUNTER — Encounter (HOSPITAL_COMMUNITY): Payer: Self-pay

## 2018-12-09 IMAGING — CT CT ANGIO CHEST
2 of 6 series · 17 of 46 positions shown · IV contrast (ISOVUE 370)
Comparison: Chest radiograph dated 09/16/2017. and chest CT dated
02/08/2016

CLINICAL DATA: 56-year-old female with chest pain. Concern for
pulmonary embolism

EXAM:
CT ANGIOGRAPHY CHEST WITH CONTRAST
TECHNIQUE: Multidetector CT imaging of the chest was performed using the
standard protocol during bolus administration of intravenous
contrast. Multiplanar CT image reconstructions and MIPs were
obtained to evaluate the vascular anatomy.
CONTRAST:  100 cc Isovue 370

[Series 6: thins · axial · 0.69mm/px · z∈[+1489,+1721]mm · 14 of 256 slices shown]
[im 12/256  lung]
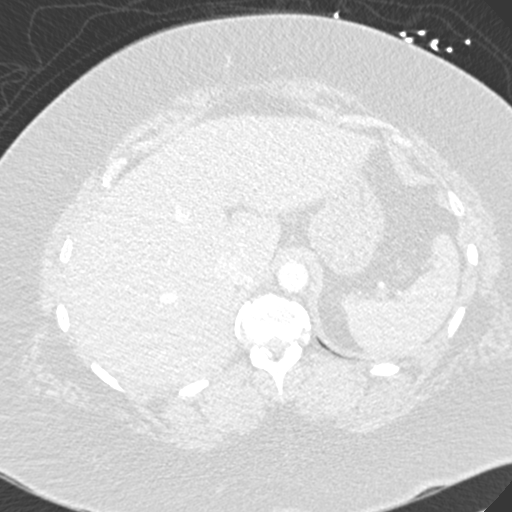
[im 34/256  soft-tissue]
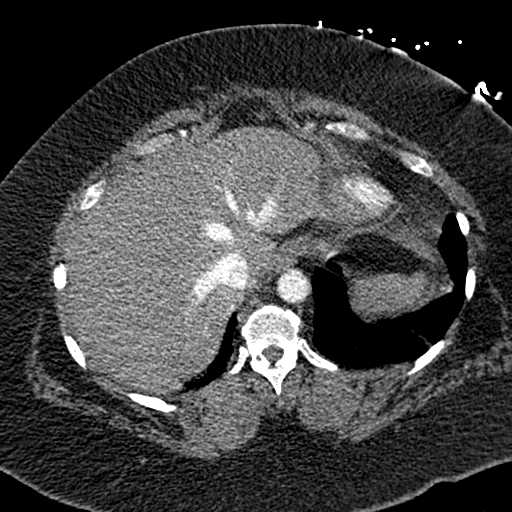
[im 45/256  lung]
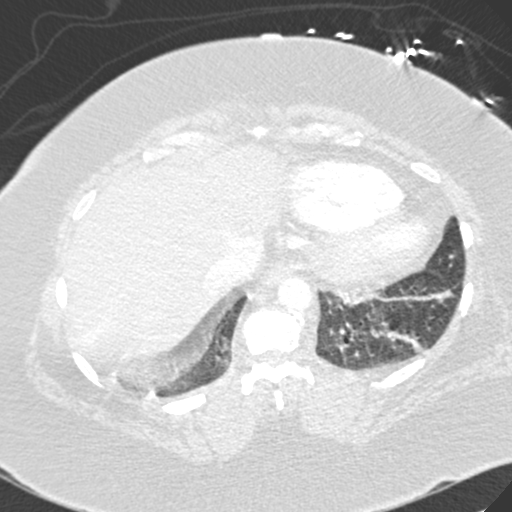
[im 67/256  soft-tissue]
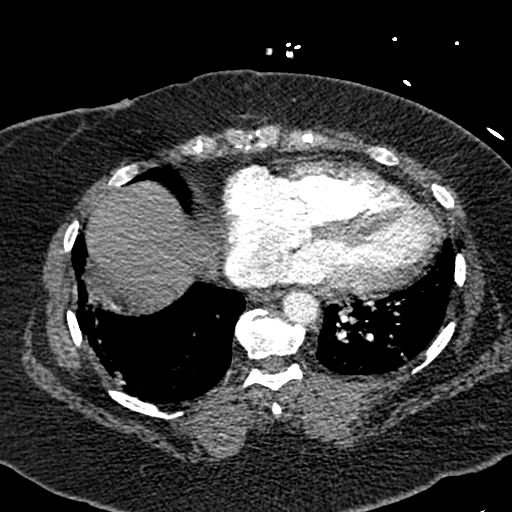
[im 89/256  lung]
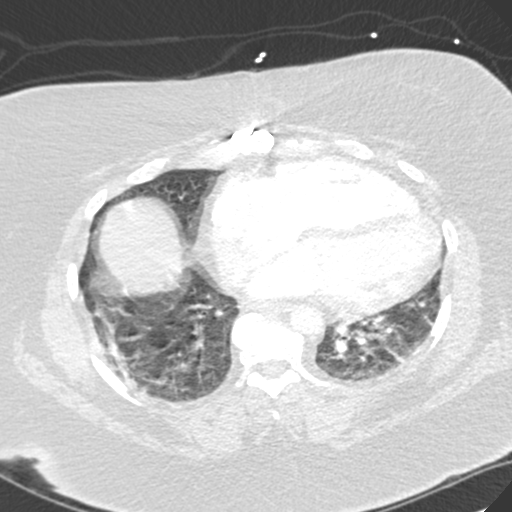
[im 100/256  soft-tissue]
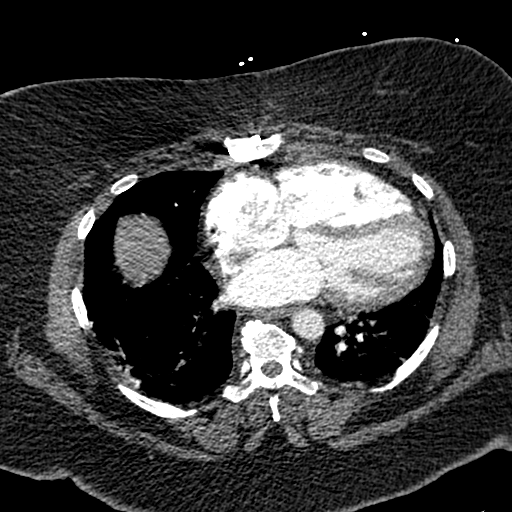
[im 122/256  lung]
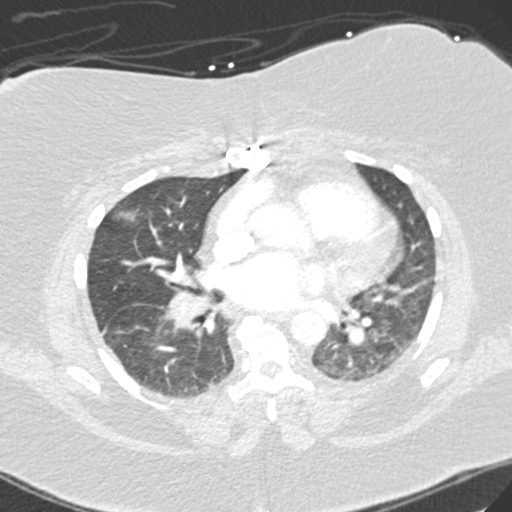
[im 134/256  soft-tissue]
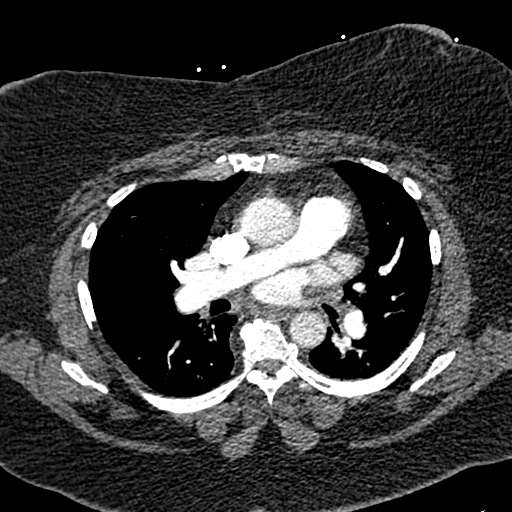
[im 156/256  lung]
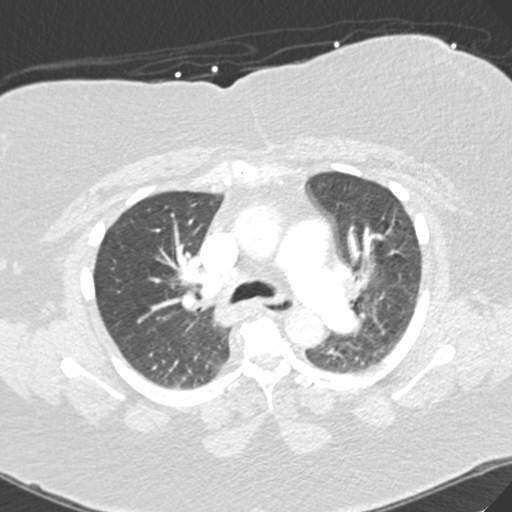
[im 167/256  soft-tissue]
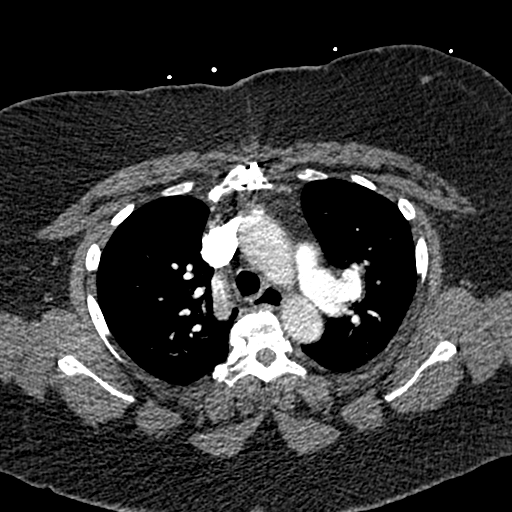
[im 189/256  lung]
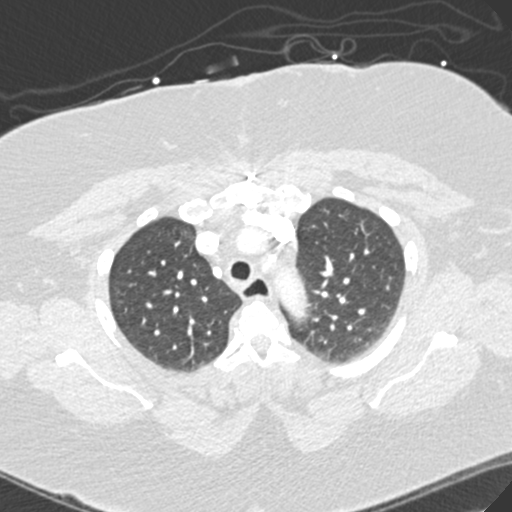
[im 211/256  soft-tissue]
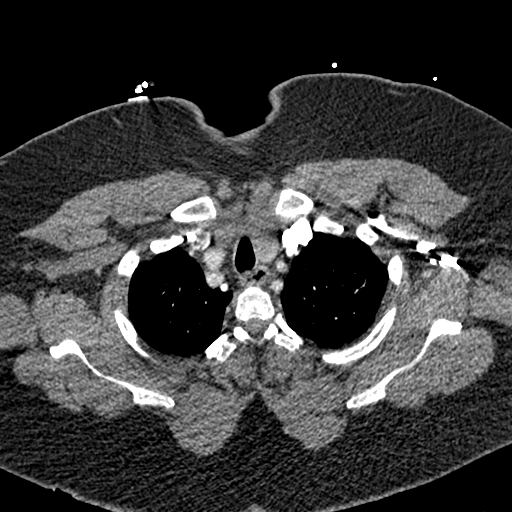
[im 222/256  lung]
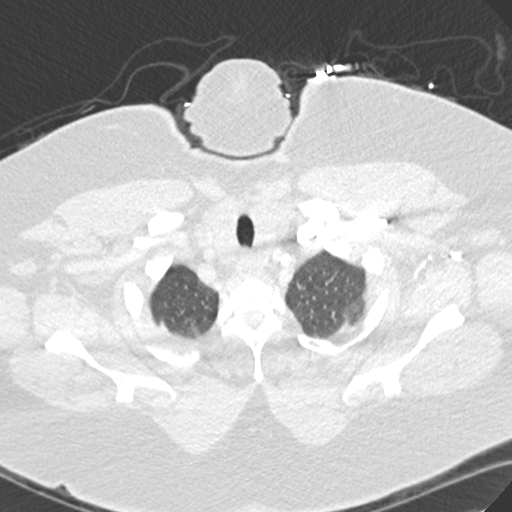
[im 244/256  soft-tissue]
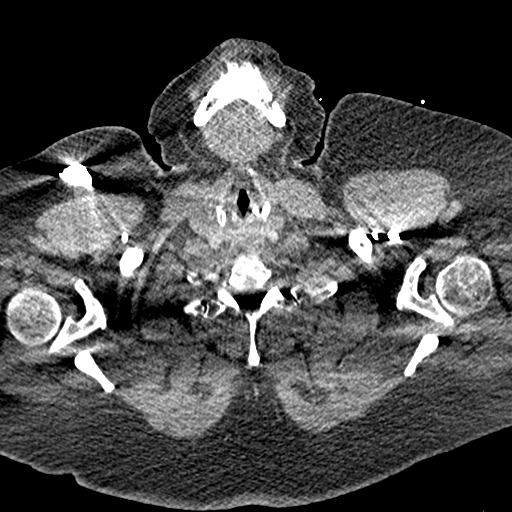

[Series 8: coronal mpr · coronal · 0.50mm/px · 3 of 136 slices shown]
[im 34/136  soft-tissue]
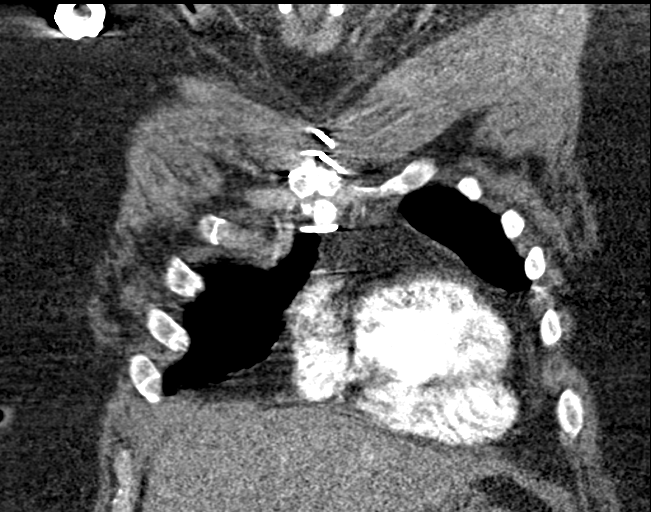
[im 68/136  soft-tissue]
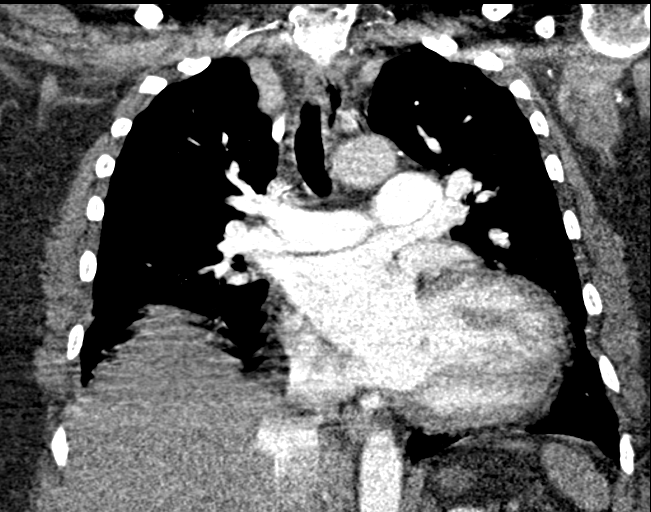
[im 102/136  soft-tissue]
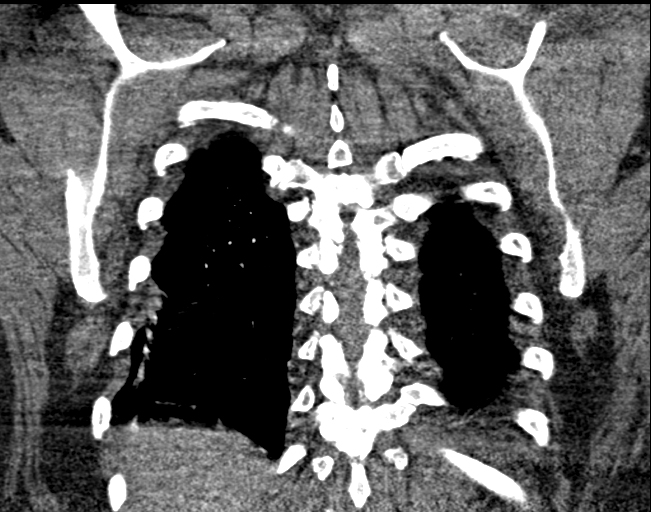

[17 of 46 positions shown; findings below may reference images not displayed]

FINDINGS: Evaluation of this exam is limited due to respiratory motion
artifact.

Cardiovascular: There is moderate cardiomegaly. No pericardial
effusion. There is retrograde flow of contrast from the right atrium
into the IVC consistent with a degree of right heart dysfunction.
Correlation with echocardiogram recommended. The thoracic aorta is
unremarkable. Evaluation of the pulmonary arteries is somewhat
limited due to suboptimal opacification of the peripheral branches
and respiratory motion artifact. There is focal area of abrupt cut
off of the right lower lobe pulmonary artery branch as seen on the
prior CT of 02/08/2016 and 12/25/2015 likely related to underlying
chronic pulmonary embolus or possibly scarring. This finding is
similar to the prior CT's. No new or acute pulmonary artery embolus
identified. Right pectoral Port-A-Cath with tip at the cavoatrial
junction.

Mediastinum/Nodes: There is no hilar or mediastinal adenopathy.
Esophagus is grossly unremarkable. No mediastinal fluid collection.

Lungs/Pleura: Bibasilar linear atelectasis/scarring. No focal
consolidation, pleural effusion, or pneumothorax. The central
airways are patent.

Upper Abdomen: No acute abnormality.

Musculoskeletal: Median sternotomy wires. There is degenerative
changes of the spine. No acute osseous pathology.

Review of the MIP images confirms the above findings.
IMPRESSION: 1. No acute intrathoracic pathology. No CT evidence of acute or new
pulmonary artery embolus. Focal area of abrupt cut off in the right
lower lobe pulmonary artery similar to prior CTs likely related to
underlying chronic pulmonary embolus or scarring.
2. Moderate cardiomegaly with findings of right heart dysfunction.
The cardiac size is relatively similar to prior CT. Correlation with
clinical exam and echocardiogram recommended.
3. Bibasilar atelectasis/scarring.

## 2018-12-19 NOTE — Telephone Encounter (Signed)
done

## 2018-12-29 ENCOUNTER — Ambulatory Visit (HOSPITAL_COMMUNITY)
Admission: RE | Admit: 2018-12-29 | Discharge: 2018-12-29 | Disposition: A | Discharge status: Home / Self Care | Payer: Medicare HMO | Source: Ambulatory Visit | Attending: Internal Medicine | Admitting: Internal Medicine

## 2018-12-29 ENCOUNTER — Other Ambulatory Visit: Payer: Self-pay

## 2018-12-29 DIAGNOSIS — I2782 Chronic pulmonary embolism: Secondary | ICD-10-CM | POA: Insufficient documentation

## 2018-12-29 LAB — COMPREHENSIVE METABOLIC PANEL
ALT: 12 U/L (ref 0–44)
AST: 14 U/L — ABNORMAL LOW (ref 15–41)
Albumin: 3.9 g/dL (ref 3.5–5.0)
Alkaline Phosphatase: 72 U/L (ref 38–126)
Anion gap: 8 (ref 5–15)
BUN: 13 mg/dL (ref 6–20)
CO2: 25 mmol/L (ref 22–32)
Calcium: 9.6 mg/dL (ref 8.9–10.3)
Chloride: 109 mmol/L (ref 98–111)
Creatinine, Ser: 1.07 mg/dL — ABNORMAL HIGH (ref 0.44–1.00)
GFR calc Af Amer: >60 mL/min (ref >60)
GFR calc non Af Amer: 58 mL/min — ABNORMAL LOW (ref >60)
Glucose, Bld: 108 mg/dL — ABNORMAL HIGH (ref 70–99)
Potassium: 3.6 mmol/L (ref 3.5–5.1)
Sodium: 142 mmol/L (ref 135–145)
Total Bilirubin: 0.6 mg/dL (ref 0.3–1.2)
Total Protein: 7.6 g/dL (ref 6.5–8.1)

## 2018-12-29 MED ORDER — HEPARIN SOD (PORK) LOCK FLUSH 100 UNIT/ML IV SOLN
500.0000 [IU] | INTRAVENOUS | Status: DC
  Administered 2018-12-29: 500 [IU] via INTRAVENOUS
  Filled 2018-12-29: qty 5

## 2018-12-29 MED ORDER — SODIUM CHLORIDE 0.9% FLUSH
10.0000 mL | INTRAVENOUS | Status: DC
  Administered 2018-12-29: 10 mL via INTRAVENOUS

## 2018-12-29 NOTE — Progress Notes (Signed)
Implanted port flushed with 500 units of heparin and 10 cc normal saline as ordered. Lab draw for CMP was also done. Patient tolerated well, alert, oriented and ambulatory on discharge,

## 2019-01-04 ENCOUNTER — Encounter (HOSPITAL_COMMUNITY): Payer: Self-pay

## 2019-01-05 ENCOUNTER — Encounter (HOSPITAL_COMMUNITY): Payer: Self-pay

## 2019-01-18 ENCOUNTER — Encounter: Payer: Self-pay | Admitting: Orthopaedic Surgery

## 2019-01-18 ENCOUNTER — Other Ambulatory Visit: Payer: Self-pay

## 2019-01-18 ENCOUNTER — Ambulatory Visit: Payer: Medicare HMO | Admitting: Orthopaedic Surgery

## 2019-01-18 DIAGNOSIS — M722 Plantar fascial fibromatosis: Secondary | ICD-10-CM | POA: Diagnosis not present

## 2019-01-18 NOTE — Progress Notes (Signed)
Office Visit Note   Patient: [name] [name]           Date of Birth: Mar 01, 1961           MRN: [id number] Visit Date: 01/18/2019              Requested by: Ruthine Dose, [place], [place] [address] PCP: Ruthine Dose, MD   Assessment & Plan: Visit Diagnoses:  1. Plantar fasciitis of left foot   2. Morbid obesity ([place])     Plan: Impression is left foot recurrent plantar fasciitis.  At this point, we will refer the patient to Dr. Sharol Given for surgical consultation.  Follow-up with [place] as needed.  Follow-Up Instructions: Return for f/u with Dr. Sharol Given for left foot sx consultation.   Orders:  No orders of the defined types were placed in this encounter.  No orders of the defined types were placed in this encounter.     Procedures: No procedures performed   Clinical Data: No additional findings.   Subjective: Chief Complaint  Patient presents with  . Left Foot - Pain    HEEL PAIN   . Foot Pain    PAIN    HPI patient is a pleasant 58 year old female who presents our clinic today with recurrent left heel pain.  History of plantar fasciitis over the past several years.  She has been to outpatient physical therapy as well as had multiple cortisone injections and used heel inserts without relief of symptoms.  Her last cortisone injection was in September 2019 which gave her about 1 days relief of symptoms.  She has had continued pain to the medial aspect of the heel.  Pain occurs throughout the entire day but is worse in the morning when she first gets out of bed.  At this point, she is ready for surgical intervention.  Review of Systems as detailed in HPI.  All others reviewed and are negative.   Objective: Vital Signs: There were no vitals taken for this visit.  Physical Exam well-developed well-nourished female no acute distress.  Alert and oriented x3.  Ortho Exam examination of her left heel reveals marked tenderness over the pad of the heel.   Limited dorsiflexion.  She is neurovascularly intact distally.  Specialty Comments:  No specialty comments available.  Imaging: No new imaging   PMFS History: Patient Active Problem List   Diagnosis Date Noted  . Leg pain, left 07/10/2018  . Bilateral primary osteoarthritis of knee 05/12/2018  . Plantar fasciitis of left foot 05/12/2018  . Shortness of breath 01/16/2018  . Palliative care encounter 01/16/2018  . Major depressive disorder, recurrent severe without psychotic features ([place]) 12/06/2017  . CHF exacerbation ([place]) 08/21/2017  . (HFpEF) heart failure with preserved ejection fraction ([place]) 08/20/2017  . Lumbar radiculopathy 08/08/2017  . Body mass index 50.0-59.9, adult ([place]) 08/08/2017  . Hoarseness 08/08/2017  . Chronic cough 04/10/2017  . Rheumatoid arthritis involving multiple sites ([place]) 04/10/2017  . Anxiety and depression 04/10/2017  . Adjustment disorder with depressed mood 12/11/2016  . Morbid obesity ([place]) 08/14/2016  . Stroke ([place]) 06/27/2016  . Right sided weakness 06/27/2016  . Essential hypertension 06/27/2016  . Atypical chest pain 06/27/2016  . Difficult airway 03/29/2016  . CTEPH (chronic thromboembolic pulmonary hypertension) ([place]) 03/14/2016  . OSA on CPAP 02/20/2016  . Chronic pain syndrome 01/31/2016  . Pulmonary embolus ([place]) 11/15/2015  . Vertigo 11/15/2015  . Depression 11/15/2015   Past Medical History:  Diagnosis Date  .  Arthritis   . CHF exacerbation ([place]) 08/21/2017  . Depression   . Diverticulitis   . Hyperlipidemia   . Hypertension   . Obesity   . Pneumonia   . Pulmonary embolism ([facility])     Family History  Problem Relation Age of Onset  . Lung cancer Maternal Grandmother   . Dementia Mother   . Dementia Father   . Anxiety disorder Sister   . Bipolar disorder Sister   . Drug abuse Sister   . Sexual abuse Maternal Aunt   . Anxiety disorder Cousin   . Anxiety disorder Sister   . Bipolar disorder Sister   . Drug abuse  Sister   . Breast cancer Neg Hx     Past Surgical History:  Procedure Laterality Date  . ABDOMINAL HYSTERECTOMY    . BREAST REDUCTION SURGERY    . CHOLECYSTECTOMY    . EMBOLECTOMY N/A    pulmonary embolectomy  . REDUCTION MAMMAPLASTY Bilateral   . RIGHT HEART CATH N/A 10/28/2016   Procedure: Right Heart Cath;  Surgeon: Jolaine Artist, MD;  Location: [facility];  Service: Cardiovascular;  Laterality: N/A;  . RIGHT HEART CATH N/A 08/26/2017   Procedure: RIGHT HEART CATH;  Surgeon: Jolaine Artist, MD;  Location: [facility];  Service: Cardiovascular;  Laterality: N/A;  . ULTRASOUND GUIDANCE FOR VASCULAR ACCESS  08/26/2017   Procedure: Ultrasound Guidance For Vascular Access;  Surgeon: Jolaine Artist, MD;  Location: [facility];  Service: Cardiovascular;;   Social History   Occupational History  . Not on file  Tobacco Use  . Smoking status: Never Smoker  . Smokeless tobacco: Never Used  Substance and Sexual Activity  . Alcohol use: No  . Drug use: No  . Sexual activity: Yes    Partners: Male    Birth control/protection: Surgical

## 2019-01-19 ENCOUNTER — Encounter: Payer: Self-pay | Admitting: Orthopedic Surgery

## 2019-01-19 ENCOUNTER — Ambulatory Visit (INDEPENDENT_AMBULATORY_CARE_PROVIDER_SITE_OTHER): Payer: Medicare HMO | Admitting: Physician Assistant

## 2019-01-19 VITALS — Ht 63.0 in | Wt 343.2 lb

## 2019-01-19 DIAGNOSIS — I5030 Unspecified diastolic (congestive) heart failure: Secondary | ICD-10-CM

## 2019-01-19 DIAGNOSIS — Z6841 Body Mass Index (BMI) 40.0 and over, adult: Secondary | ICD-10-CM

## 2019-01-19 DIAGNOSIS — I2782 Chronic pulmonary embolism: Secondary | ICD-10-CM

## 2019-01-19 DIAGNOSIS — Z95828 Presence of other vascular implants and grafts: Secondary | ICD-10-CM

## 2019-01-19 DIAGNOSIS — Z7901 Long term (current) use of anticoagulants: Secondary | ICD-10-CM

## 2019-01-19 DIAGNOSIS — I2724 Chronic thromboembolic pulmonary hypertension: Secondary | ICD-10-CM

## 2019-01-19 DIAGNOSIS — M722 Plantar fascial fibromatosis: Secondary | ICD-10-CM

## 2019-01-20 ENCOUNTER — Encounter: Payer: Self-pay | Admitting: Orthopedic Surgery

## 2019-01-20 NOTE — Progress Notes (Signed)
Office Visit Note   Patient: [name] [name]           Date of Birth: 03-09-1961           MRN: [id number] Visit Date: 01/19/2019              Requested by: Ruthine Dose, [place], [place] [address] PCP: Ruthine Dose, MD  Chief Complaint  Patient presents with  . Left Foot - Follow-up, Pain      HPI: The patient is a 58 year old woman who is seen for chronic plantar fasciitis of her left foot.  She reports that she has had in a number of treatments including multiple steroid injections, oral medications, Achilles stretching exercises and other therapeutic modalities with physical therapy, none of which have relieved her symptoms.  She reports that this is causing continued pain and taking away the quality of her life.  She does ambulate with a cane but at this point would like to consider surgical intervention if this is a possibility.  She has a very complex medical history which includes a history of pulmonary hypertension related to chronic pulmonary embolism.  She did undergo a thrombo-embolectomy in 03/2016 with an IVC filter and remains on chronic anticoagulation for life.  She reports that she is currently on Xarelto.   Her last 2D echocardiogram was in October 2019 with an estimated ejection fraction at 55 to 60% with normal wall motion, grade 1 diastolic dysfunction, systolic flattening of the ventricular septum consistent with RV pressure load, mild left atrial dilatation, right ventricle with moderately reduced systolic function and severe hypokinesis of the lateral free wall, mild to moderate right atrial dilatation.  Pulmonary artery showed systolic pressure moderately to severely increased with pulmonary artery peak pressure at 62 mmHg. Last lower extremity venous Doppler studies showed the left lower extremity findings consistent with chronic deep vein thrombosis involving the left peroneal vein.  The right side showed no evidence of common femoral vein  obstruction.  Assessment & Plan: Visit Diagnoses:  1. Plantar fasciitis of left foot   2. CTEPH (chronic thromboembolic pulmonary hypertension) (HCC)   3. Other chronic pulmonary embolism without acute cor pulmonale ([place])   4. S/P IVC filter   5. Heart failure with preserved ejection fraction, unspecified HF chronicity ([place])   6. Body mass index 50.0-59.9, adult ([place])   7. Chronic anticoagulation     Plan: We did discuss possible gastrocs recession and the expected results and recovery time following this surgery.  Given the complexity of the patient's past medical history, she does need to meet with Dr. Sharol Given for further discussion prior to proceeding with surgical intervention.  She is followed by Dr. Haroldine Laws for her chronic pulmonary hypertension related to chronic pulmonary embolism and may likely need cardiac clearance prior to any surgical intervention.  Follow-Up Instructions: Return in about 2 days (around 01/21/2019).   Ortho Exam  Patient is alert, oriented, no adenopathy, well-dressed, normal affect, normal respiratory effort. The patient ambulates with an antalgic appearing gait with a cane in the right hand.  Her left foot shows dorsiflexion to -5 degrees of neutral. She is tender to palpation over the left heel pad over the central and medial aspect.  She has good strength in the left lower extremity throughout and palpable dorsalis pedis and posterior tibialis pulses.  There are no signs of cellulitis or infection in the foot. Imaging: No results found. No images are attached to the encounter.  Labs: Lab  Results  Component Value Date   HGBA1C 6.5 (H) 11/02/2018   HGBA1C 5.6 06/27/2016     Lab Results  Component Value Date   ALBUMIN 3.9 12/29/2018   ALBUMIN 3.7 11/02/2018   ALBUMIN 3.6 06/05/2018    Body mass index is 60.8 kg/m.  Orders:  No orders of the defined types were placed in this encounter.  No orders of the defined types were placed in this  encounter.    Procedures: No procedures performed  Clinical Data: No additional findings.  ROS:  All other systems negative, except as noted in the HPI. Review of Systems  Objective: Vital Signs: Ht 5\' 3"  (1.6 m)   Wt (!) 343 lb 3.2 oz (155.7 kg)   BMI 60.80 kg/m   Specialty Comments:  No specialty comments available.  PMFS History: Patient Active Problem List   Diagnosis Date Noted  . Leg pain, left 07/10/2018  . Bilateral primary osteoarthritis of knee 05/12/2018  . Plantar fasciitis of left foot 05/12/2018  . Shortness of breath 01/16/2018  . Palliative care encounter 01/16/2018  . Major depressive disorder, recurrent severe without psychotic features ([place]) 12/06/2017  . CHF exacerbation ([place]) 08/21/2017  . (HFpEF) heart failure with preserved ejection fraction ([place]) 08/20/2017  . Lumbar radiculopathy 08/08/2017  . Body mass index 50.0-59.9, adult ([place]) 08/08/2017  . Hoarseness 08/08/2017  . Chronic cough 04/10/2017  . Rheumatoid arthritis involving multiple sites ([place]) 04/10/2017  . Anxiety and depression 04/10/2017  . Adjustment disorder with depressed mood 12/11/2016  . Morbid obesity ([place]) 08/14/2016  . Stroke ([place]) 06/27/2016  . Right sided weakness 06/27/2016  . Essential hypertension 06/27/2016  . Atypical chest pain 06/27/2016  . Difficult airway 03/29/2016  . CTEPH (chronic thromboembolic pulmonary hypertension) ([place]) 03/14/2016  . OSA on CPAP 02/20/2016  . Chronic pain syndrome 01/31/2016  . Pulmonary embolus ([place]) 11/15/2015  . Vertigo 11/15/2015  . Depression 11/15/2015   Past Medical History:  Diagnosis Date  . Arthritis   . CHF exacerbation ([place]) 08/21/2017  . Depression   . Diverticulitis   . Hyperlipidemia   . Hypertension   . Obesity   . Pneumonia   . Pulmonary embolism ([facility])     Family History  Problem Relation Age of Onset  . Lung cancer Maternal Grandmother   . Dementia Mother   . Dementia Father   . Anxiety disorder Sister    . Bipolar disorder Sister   . Drug abuse Sister   . Sexual abuse Maternal Aunt   . Anxiety disorder Cousin   . Anxiety disorder Sister   . Bipolar disorder Sister   . Drug abuse Sister   . Breast cancer Neg Hx     Past Surgical History:  Procedure Laterality Date  . ABDOMINAL HYSTERECTOMY    . BREAST REDUCTION SURGERY    . CHOLECYSTECTOMY    . EMBOLECTOMY N/A    pulmonary embolectomy  . REDUCTION MAMMAPLASTY Bilateral   . RIGHT HEART CATH N/A 10/28/2016   Procedure: Right Heart Cath;  Surgeon: Jolaine Artist, MD;  Location: [facility];  Service: Cardiovascular;  Laterality: N/A;  . RIGHT HEART CATH N/A 08/26/2017   Procedure: RIGHT HEART CATH;  Surgeon: Jolaine Artist, MD;  Location: [facility];  Service: Cardiovascular;  Laterality: N/A;  . ULTRASOUND GUIDANCE FOR VASCULAR ACCESS  08/26/2017   Procedure: Ultrasound Guidance For Vascular Access;  Surgeon: Jolaine Artist, MD;  Location: [facility];  Service: Cardiovascular;;  Social History   Occupational History  . Not on file  Tobacco Use  . Smoking status: Never Smoker  . Smokeless tobacco: Never Used  Substance and Sexual Activity  . Alcohol use: No  . Drug use: No  . Sexual activity: Yes    Partners: Male    Birth control/protection: Surgical

## 2019-01-21 ENCOUNTER — Ambulatory Visit: Payer: Medicare HMO | Admitting: Orthopedic Surgery

## 2019-01-21 ENCOUNTER — Encounter: Payer: Self-pay | Admitting: Orthopedic Surgery

## 2019-01-21 ENCOUNTER — Other Ambulatory Visit: Payer: Self-pay

## 2019-01-21 VITALS — Ht 63.0 in | Wt 343.2 lb

## 2019-01-21 DIAGNOSIS — Z6841 Body Mass Index (BMI) 40.0 and over, adult: Secondary | ICD-10-CM

## 2019-01-21 DIAGNOSIS — M722 Plantar fascial fibromatosis: Secondary | ICD-10-CM | POA: Diagnosis not present

## 2019-01-21 DIAGNOSIS — M6702 Short Achilles tendon (acquired), left ankle: Secondary | ICD-10-CM | POA: Diagnosis not present

## 2019-01-21 NOTE — Progress Notes (Signed)
Office Visit Note   Patient: [name] [name]           Date of Birth: 1960-10-15           MRN: [id number] Visit Date: 01/21/2019              Requested by: Ruthine Dose, [place], [place] [address] PCP: Ruthine Dose, MD  Chief Complaint  Patient presents with  . Left Foot - Pain, Follow-up    Discuss surgery      HPI: Patient is a 58 year old woman who presents complaining of chronic plantar fasciitis on the left.  Patient states she is undergone for steroid injections without relief.  She has pain with start up and pain during daily activities.  Patient does have a complex medical history including a inferior vena cava filter with history of chronic thromboembolic pulmonary hypertension chronic pulmonary embolism heart failure.  Patient is on anticoagulation with Xarelto.  She does have a port on her chest she states that she needs this for venous access and she states this is flushed out monthly at [facility].  Assessment & Plan: Visit Diagnoses:  1. Plantar fasciitis of left foot   2. Body mass index 50.0-59.9, adult ([place])   3. Morbid obesity ([place])   4. Achilles tendon contracture, left     Plan: Examination patient has a palpable dorsalis pedis pulse.  She has dorsiflexion only to neutral with her knee extended with heel cord contracture.  She does not have tenderness to palpation over the tarsal tunnel she does have some tenderness to palpation over the Achilles lateral compression of the calcaneus is minimally tender however radiographs shows no evidence of a fracture.  She is maximally tender over the origin of the plantar fascia.  Caloric intake was discussed.  Follow-Up Instructions: Return in about 1 week (around 01/28/2019).   Ortho Exam  Patient is alert, oriented, no adenopathy, well-dressed, normal affect, normal respiratory effort. Examination patient has a good dorsalis pedis pulse she has dorsiflexion only to neutral with a Achilles contracture.   The tarsal tunnel is nontender to palpation the Achilles tendon is minimally tender to palpation lateral compression of the calcaneus is minimally tender and radiographs are reviewed which shows no evidence of a fracture she has minimal bony spurs.  She is maximally tender to palpation over the origin of the plantar fascia.  Imaging: No results found. No images are attached to the encounter.  Labs: Lab Results  Component Value Date   HGBA1C 6.5 (H) 11/02/2018   HGBA1C 5.6 06/27/2016     Lab Results  Component Value Date   ALBUMIN 3.9 12/29/2018   ALBUMIN 3.7 11/02/2018   ALBUMIN 3.6 06/05/2018    Body mass index is 60.8 kg/m.  Orders:  No orders of the defined types were placed in this encounter.  No orders of the defined types were placed in this encounter.    Procedures: No procedures performed  Clinical Data: No additional findings.  ROS:  All other systems negative, except as noted in the HPI. Review of Systems  Objective: Vital Signs: Ht 5\' 3"  (1.6 m)   Wt (!) 343 lb 3.2 oz (155.7 kg)   BMI 60.80 kg/m   Specialty Comments:  No specialty comments available.  PMFS History: Patient Active Problem List   Diagnosis Date Noted  . Leg pain, left 07/10/2018  . Bilateral primary osteoarthritis of knee 05/12/2018  . Plantar fasciitis of left foot 05/12/2018  . Shortness of  breath 01/16/2018  . Palliative care encounter 01/16/2018  . Major depressive disorder, recurrent severe without psychotic features ([place]) 12/06/2017  . CHF exacerbation ([place]) 08/21/2017  . (HFpEF) heart failure with preserved ejection fraction ([place]) 08/20/2017  . Lumbar radiculopathy 08/08/2017  . Body mass index 50.0-59.9, adult ([place]) 08/08/2017  . Hoarseness 08/08/2017  . Chronic cough 04/10/2017  . Rheumatoid arthritis involving multiple sites ([place]) 04/10/2017  . Anxiety and depression 04/10/2017  . Adjustment disorder with depressed mood 12/11/2016  . Morbid obesity ([place])  08/14/2016  . Stroke ([place]) 06/27/2016  . Right sided weakness 06/27/2016  . Essential hypertension 06/27/2016  . Atypical chest pain 06/27/2016  . Difficult airway 03/29/2016  . CTEPH (chronic thromboembolic pulmonary hypertension) ([place]) 03/14/2016  . OSA on CPAP 02/20/2016  . Chronic pain syndrome 01/31/2016  . Pulmonary embolus ([place]) 11/15/2015  . Vertigo 11/15/2015  . Depression 11/15/2015   Past Medical History:  Diagnosis Date  . Arthritis   . CHF exacerbation ([place]) 08/21/2017  . Depression   . Diverticulitis   . Hyperlipidemia   . Hypertension   . Obesity   . Pneumonia   . Pulmonary embolism (H[facility])     Family History  Problem Relation Age of Onset  . Lung cancer Maternal Grandmother   . Dementia Mother   . Dementia Father   . Anxiety disorder Sister   . Bipolar disorder Sister   . Drug abuse Sister   . Sexual abuse Maternal Aunt   . Anxiety disorder Cousin   . Anxiety disorder Sister   . Bipolar disorder Sister   . Drug abuse Sister   . Breast cancer Neg Hx     Past Surgical History:  Procedure Laterality Date  . ABDOMINAL HYSTERECTOMY    . BREAST REDUCTION SURGERY    . CHOLECYSTECTOMY    . EMBOLECTOMY N/A    pulmonary embolectomy  . REDUCTION MAMMAPLASTY Bilateral   . RIGHT HEART CATH N/A 10/28/2016   Procedure: Right Heart Cath;  Surgeon: Jolaine Artist, MD;  Location: [facility];  Service: Cardiovascular;  Laterality: N/A;  . RIGHT HEART CATH N/A 08/26/2017   Procedure: RIGHT HEART CATH;  Surgeon: Jolaine Artist, MD;  Location: [facility];  Service: Cardiovascular;  Laterality: N/A;  . ULTRASOUND GUIDANCE FOR VASCULAR ACCESS  08/26/2017   Procedure: Ultrasound Guidance For Vascular Access;  Surgeon: Jolaine Artist, MD;  Location: [facility];  Service: Cardiovascular;;   Social History   Occupational History  . Not on file  Tobacco Use  . Smoking status: Never Smoker  . Smokeless tobacco: Never Used  Substance  and Sexual Activity  . Alcohol use: No  . Drug use: No  . Sexual activity: Yes    Partners: Male    Birth control/protection: Surgical

## 2019-01-28 ENCOUNTER — Encounter (HOSPITAL_COMMUNITY): Payer: Self-pay

## 2019-02-03 ENCOUNTER — Other Ambulatory Visit: Payer: Self-pay

## 2019-02-04 ENCOUNTER — Ambulatory Visit (HOSPITAL_COMMUNITY)
Admission: RE | Admit: 2019-02-04 | Discharge: 2019-02-04 | Disposition: A | Discharge status: Home / Self Care | Payer: Medicare HMO | Source: Ambulatory Visit | Attending: Internal Medicine | Admitting: Internal Medicine

## 2019-02-04 ENCOUNTER — Other Ambulatory Visit: Payer: Self-pay

## 2019-02-04 DIAGNOSIS — I2782 Chronic pulmonary embolism: Secondary | ICD-10-CM | POA: Diagnosis not present

## 2019-02-04 DIAGNOSIS — Z95828 Presence of other vascular implants and grafts: Secondary | ICD-10-CM | POA: Diagnosis not present

## 2019-02-04 MED ORDER — HEPARIN SOD (PORK) LOCK FLUSH 100 UNIT/ML IV SOLN
500.0000 [IU] | INTRAVENOUS | Status: AC | PRN
  Administered 2019-02-04: 500 [IU]

## 2019-02-04 MED ORDER — SODIUM CHLORIDE 0.9% FLUSH
10.0000 mL | INTRAVENOUS | Status: AC | PRN
  Administered 2019-02-04: 10 mL

## 2019-02-04 NOTE — Discharge Instructions (Signed)
Port-a-cath flushed with Heparin and 0.9% Sodium Chloride 

## 2019-02-04 NOTE — Progress Notes (Signed)
Duplicate

## 2019-02-04 NOTE — Progress Notes (Signed)
PATIENT CARE CENTER NOTE   Provider:  Karle Plumber, MD   Procedure: Port-a-cath flush   Note: Patient's PAC flushed with heparin and normal saline. Sterile technique used. Patient tolerated well. Discharge instructions given. Alert, oriented and ambulatory at discharge.

## 2019-02-10 ENCOUNTER — Other Ambulatory Visit: Payer: Self-pay

## 2019-02-10 ENCOUNTER — Ambulatory Visit (HOSPITAL_COMMUNITY)
Admission: RE | Admit: 2019-02-10 | Discharge: 2019-02-10 | Disposition: A | Discharge status: Home / Self Care | Payer: Medicare HMO | Source: Ambulatory Visit | Attending: Physician Assistant | Admitting: Physician Assistant

## 2019-02-10 ENCOUNTER — Encounter (HOSPITAL_COMMUNITY): Payer: Self-pay

## 2019-02-10 ENCOUNTER — Other Ambulatory Visit (HOSPITAL_COMMUNITY)
Admission: RE | Admit: 2019-02-10 | Discharge: 2019-02-10 | Disposition: A | Discharge status: Home / Self Care | Payer: Medicare HMO | Source: Ambulatory Visit

## 2019-02-10 ENCOUNTER — Encounter (HOSPITAL_COMMUNITY)
Admission: RE | Admit: 2019-02-10 | Discharge: 2019-02-10 | Disposition: A | Discharge status: Home / Self Care | Payer: Medicare HMO | Source: Ambulatory Visit | Attending: Orthopedic Surgery | Admitting: Orthopedic Surgery

## 2019-02-10 DIAGNOSIS — M722 Plantar fascial fibromatosis: Secondary | ICD-10-CM | POA: Diagnosis not present

## 2019-02-10 DIAGNOSIS — Z1159 Encounter for screening for other viral diseases: Secondary | ICD-10-CM | POA: Insufficient documentation

## 2019-02-10 DIAGNOSIS — Z789 Other specified health status: Secondary | ICD-10-CM

## 2019-02-10 DIAGNOSIS — Z7901 Long term (current) use of anticoagulants: Secondary | ICD-10-CM | POA: Diagnosis not present

## 2019-02-10 DIAGNOSIS — F419 Anxiety disorder, unspecified: Secondary | ICD-10-CM | POA: Insufficient documentation

## 2019-02-10 DIAGNOSIS — M1990 Unspecified osteoarthritis, unspecified site: Secondary | ICD-10-CM | POA: Diagnosis not present

## 2019-02-10 DIAGNOSIS — Z6841 Body Mass Index (BMI) 40.0 and over, adult: Secondary | ICD-10-CM | POA: Diagnosis not present

## 2019-02-10 DIAGNOSIS — G4733 Obstructive sleep apnea (adult) (pediatric): Secondary | ICD-10-CM | POA: Insufficient documentation

## 2019-02-10 DIAGNOSIS — I509 Heart failure, unspecified: Secondary | ICD-10-CM | POA: Insufficient documentation

## 2019-02-10 DIAGNOSIS — F329 Major depressive disorder, single episode, unspecified: Secondary | ICD-10-CM | POA: Insufficient documentation

## 2019-02-10 DIAGNOSIS — Z95828 Presence of other vascular implants and grafts: Secondary | ICD-10-CM

## 2019-02-10 DIAGNOSIS — Z79899 Other long term (current) drug therapy: Secondary | ICD-10-CM | POA: Insufficient documentation

## 2019-02-10 DIAGNOSIS — I2721 Secondary pulmonary arterial hypertension: Secondary | ICD-10-CM | POA: Diagnosis not present

## 2019-02-10 DIAGNOSIS — Z01818 Encounter for other preprocedural examination: Secondary | ICD-10-CM | POA: Diagnosis not present

## 2019-02-10 DIAGNOSIS — E785 Hyperlipidemia, unspecified: Secondary | ICD-10-CM | POA: Insufficient documentation

## 2019-02-10 DIAGNOSIS — Z7951 Long term (current) use of inhaled steroids: Secondary | ICD-10-CM | POA: Diagnosis not present

## 2019-02-10 DIAGNOSIS — I110 Hypertensive heart disease with heart failure: Secondary | ICD-10-CM | POA: Insufficient documentation

## 2019-02-10 DIAGNOSIS — G894 Chronic pain syndrome: Secondary | ICD-10-CM | POA: Diagnosis not present

## 2019-02-10 DIAGNOSIS — Z86711 Personal history of pulmonary embolism: Secondary | ICD-10-CM | POA: Diagnosis not present

## 2019-02-10 HISTORY — DX: Pulmonary hypertension, unspecified: I27.20

## 2019-02-10 HISTORY — DX: Gastro-esophageal reflux disease without esophagitis: K21.9

## 2019-02-10 HISTORY — DX: Anxiety disorder, unspecified: F41.9

## 2019-02-10 HISTORY — DX: Failed or difficult intubation, initial encounter: T88.4XXA

## 2019-02-10 HISTORY — DX: Plantar fascial fibromatosis: M72.2

## 2019-02-10 HISTORY — DX: Family history of other specified conditions: Z84.89

## 2019-02-10 HISTORY — DX: Sleep apnea, unspecified: G47.30

## 2019-02-10 HISTORY — DX: Presence of spectacles and contact lenses: Z97.3

## 2019-02-10 HISTORY — DX: Dyspnea, unspecified: R06.00

## 2019-02-10 HISTORY — DX: Prediabetes: R73.03

## 2019-02-10 HISTORY — DX: Presence of other vascular implants and grafts: Z95.828

## 2019-02-10 LAB — BASIC METABOLIC PANEL
Anion gap: 8 (ref 5–15)
BUN: 12 mg/dL (ref 6–20)
CO2: 27 mmol/L (ref 22–32)
Calcium: 9.5 mg/dL (ref 8.9–10.3)
Chloride: 107 mmol/L (ref 98–111)
Creatinine, Ser: 1.18 mg/dL — ABNORMAL HIGH (ref 0.44–1.00)
GFR calc Af Amer: 60 mL/min — ABNORMAL LOW (ref >60)
GFR calc non Af Amer: 52 mL/min — ABNORMAL LOW (ref >60)
Glucose, Bld: 87 mg/dL (ref 70–99)
Potassium: 4.6 mmol/L (ref 3.5–5.1)
Sodium: 142 mmol/L (ref 135–145)

## 2019-02-10 LAB — CBC
HCT: 41 % (ref 36.0–46.0)
Hemoglobin: 12.6 g/dL (ref 12.0–15.0)
MCH: 21.1 pg — ABNORMAL LOW (ref 26.0–34.0)
MCHC: 30.7 g/dL (ref 30.0–36.0)
MCV: 68.6 fL — ABNORMAL LOW (ref 80.0–100.0)
Platelets: 272 10*3/uL (ref 150–400)
RBC: 5.98 MIL/uL — ABNORMAL HIGH (ref 3.87–5.11)
RDW: 18.4 % — ABNORMAL HIGH (ref 11.5–15.5)
WBC: 6.8 10*3/uL (ref 4.0–10.5)
nRBC: 0 % (ref 0.0–0.2)

## 2019-02-10 LAB — GLUCOSE, CAPILLARY: Glucose-Capillary: 114 mg/dL — ABNORMAL HIGH (ref 70–99)

## 2019-02-10 LAB — SARS CORONAVIRUS 2 BY RT PCR (HOSPITAL ORDER, PERFORMED IN ~~LOC~~ HOSPITAL LAB): SARS Coronavirus 2: NEGATIVE

## 2019-02-10 LAB — HEMOGLOBIN A1C
Hgb A1c MFr Bld: 6.3 % — ABNORMAL HIGH (ref 4.8–5.6)
Mean Plasma Glucose: 134.11 mg/dL

## 2019-02-10 MED ORDER — HEPARIN SOD (PORK) LOCK FLUSH 100 UNIT/ML IV SOLN: 500.0000 [IU] | INTRAVENOUS | Status: DC | PRN

## 2019-02-10 NOTE — Anesthesia Preprocedure Evaluation (Addendum)
Anesthesia Evaluation  Patient identified by MRN, date of birth, ID band Patient awake    Reviewed: Allergy & Precautions, NPO status , Patient's Chart, lab work & pertinent test results  History of Anesthesia Complications (+) DIFFICULT AIRWAY  Airway Mallampati: II  TM Distance: >3 FB Neck ROM: Full    Dental no notable dental hx.    Pulmonary sleep apnea ,    Pulmonary exam normal breath sounds clear to auscultation       Cardiovascular hypertension, Pt. on medications negative cardio ROS Normal cardiovascular exam Rhythm:Regular Rate:Normal     Neuro/Psych Anxiety Depression CVA negative psych ROS   GI/Hepatic Neg liver ROS, GERD  ,  Endo/Other  Morbid obesity  Renal/GU negative Renal ROS  negative genitourinary   Musculoskeletal  (+) Arthritis , Osteoarthritis,    Abdominal   Peds negative pediatric ROS (+)  Hematology negative hematology ROS (+)   Anesthesia Other Findings   Reproductive/Obstetrics negative OB ROS                            Anesthesia Physical Anesthesia Plan  ASA: III  Anesthesia Plan: General   Post-op Pain Management:    Induction: Intravenous  PONV Risk Score and Plan: 3 and Ondansetron, Dexamethasone and Midazolam  Airway Management Planned: Oral ETT  Additional Equipment:   Intra-op Plan:   Post-operative Plan: Extubation in OR  Informed Consent: I have reviewed the patients History and Physical, chart, labs and discussed the procedure including the risks, benefits and alternatives for the proposed anesthesia with the patient or authorized representative who has indicated his/her understanding and acceptance.     Dental advisory given  Plan Discussed with: CRNA  Anesthesia Plan Comments: (History of difficult intubation. See PAT note by Karoline Caldwell, PA-C  )      Anesthesia Quick Evaluation

## 2019-02-10 NOTE — Progress Notes (Addendum)
Anesthesia Chart Review:  Case:  [id number] Date/Time:  02/12/19 0715   Procedure:  LEFT GASTROCNEMIUS RECESSION, PLANTAR FASCIA RELEASE (Left )   Anesthesia type:  Choice   Pre-op diagnosis:  Plantar Fascitis Left Foot   Location:  [facility] OR ROOM 04 / [place] [facility]   Surgeon:  Newt Minion, MD      DISCUSSION: 58 yo female never smoker. 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   Follows with Dr. 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 Last seen 07/01/2018 and had repeat echo at that time. Per note "Echo reviewed personally. Limited images due to size but LVEF is normal. RV appears grossly normal. There is no septal flattening. No suggestion of severe PAH. Volume status looks good on exam. Recently found to have some chronic LE clots. Remains on [facility] and has IVC filter in place." Pt also had a walk test done and was able to maintain O2 sats >88% and does not require supplemental O2.  Pt has a history of difficult intubation. Records below from [place] in 2017 when she underwent pulmonary embolectomy for chronic PE.  Final Airway Details Final airway type: endotracheal airway  Endotracheal/SGA details: ETT Cuffed: yes  Successful intubation technique: flexible intubating bronchoscopy Facilitating devices/methods: Oral airway Blade: flexible intubating bronchoscope Blade size: #4 ETT size: 8.0 mm Placement verified by: bronchoscopy  Initial leak: No  Measured from: lips ETT to lips (cm): 24 Number of attempts at approach: 1 Ventilation between attempts: mask, supraglottic airway and 2 hand mask Number of other approaches attempted: 3 or more  Other Attempts Unsuccessful attempted endotracheal techniques: video laryngoscopy  Additional Comments First intubating  attempt by CTA fellow with video laryngoscope D blade which shows very poor view with excessive soft tissues with poor anatomic landmark. Unable to identify epiglottis, patient was mask by two person with oral airway then re-attempted with combined D blade and brpnchoscope and was unsuccessful due to vocal cord closure. Airway eventually obtained with LMA and bronchoscope with aintree as exchange catheter.   Pt has a port in place because she has very poor peripheral access. At her PAT appt the port was flushed but the IV team was not able to draw blood. CXR was ordered per protocol and showed no acute abnormalities. She was advised to be seen at [facility] where she receives her regular port flushes. Pt called back stating [facility] unable to give any recommendations and she could not be seen as there was no order for her to have her port accessed. She was instructed to call her PCP who manages the orders for her port as well as Dr. Jess Barters office to make them aware. I also spoke with an NP at her [facility] who stated she is going to try to get the patient seen at [facility] to have her port declotted.  Discussed pt with Drs. Houser and Hodierne. They advised best course for anesthesia would be to use popliteal and adductor canal block. Spinal anesthesia contraindicated due to antiocoagulation for hx of chronic PE. Dr. Valma Cava suggested having LMA Proseal for backup. Regarding IV access, given that the pt is an extremely difficult stick, if her port is not accessable they suggested using a central line. Dr. Jess Barters office has been contacted and asked if case  can be moved to a later start time.  Addendum: Received call back from NP at [facility] who called [facility] and was told that they do not declot ports. She also reached out to [facility] and interventional radiology. IR said they could potentially do it but the recommended Dr. Jess Barters office handle the referral. She referred the matter back to Dr. Jess Barters office to  address.  VS: BP 132/73   Pulse 83   Temp 36.9 C (Oral)   Resp 20   Ht 5\' 3"  (1.6 m)   Wt (!) 152.6 kg   BMI 59.59 kg/m   PROVIDERS: Karle Plumber, MD is PCP  Glori Bickers, MD is Cardiologist  LABS: Labs reviewed: Acceptable for surgery. (all labs ordered are listed, but only abnormal results are displayed)  Labs Reviewed  GLUCOSE, CAPILLARY - Abnormal; Notable for the following components:      Result Value   Glucose-Capillary 114 (*)    All other components within normal limits  HEMOGLOBIN A1C - Abnormal; Notable for the following components:   Hgb A1c MFr Bld 6.3 (*)    All other components within normal limits  BASIC METABOLIC PANEL - Abnormal; Notable for the following components:   Creatinine, Ser 1.18 (*)    GFR calc non Af Amer 52 (*)    GFR calc Af Amer 60 (*)    All other components within normal limits  CBC - Abnormal; Notable for the following components:   RBC 5.98 (*)    MCV 68.6 (*)    MCH 21.1 (*)    RDW 18.4 (*)    All other components within normal limits     IMAGES: PORTABLE CHEST 1 VIEW 02/10/19:  COMPARISON:  06/03/2018  FINDINGS: Cardiac shadow is stable. Postsurgical changes are again seen. Right chest wall port is noted with catheter tip in satisfactory position. No focal infiltrate or effusion is seen. Some scarring in the right base is again noted.  IMPRESSION: No acute abnormality noted.  EKG: 12/05/17: Normal sinus rhythm. Rate 87. Non-specific intra-ventricular conduction block. T wave abnormality, consider anterior ischemia. No significant change since last tracing  CV: TTE 07/01/2018: Study Conclusions  - Left ventricle: The cavity size was normal. Wall thickness was   normal. Systolic function was normal. The estimated ejection   fraction was in the range of 55% to 60%. Wall motion was normal;   there were no regional wall motion abnormalities. Doppler   parameters are consistent with abnormal left  ventricular   relaxation (grade 1 diastolic dysfunction). - Ventricular septum: The contour showed systolic flattening. These   changes are consistent with RV pressure overload. - Left atrium: The atrium was mildly dilated. - Right ventricle: The cavity size was moderately dilated. Systolic   function was moderately reduced. Severe hypokinesis of the RV   lateral free wall. - Right atrium: The atrium was mildly to moderately dilated. - Atrial septum: No defect or patent foramen ovale was identified. - Pulmonary arteries: Systolic pressure was moderately to severely   increased. PA peak pressure: 62 mm Hg (S).  [place] 08/26/2017: Findings:  RA = 6 RV = 50/7 PA = 48/13 (30) PCW = 6 Fick cardiac output/index = 7.0/2.9 PVR = 3.1 WU Ao sat = 97% PA sat = 67%, 70% SVC sat = 70%  Assessment:  Mild PAH (on Adempas 1mg  tid) with high cardiac output. No evidence of intracardiac shunting.   Plan/Discussion:  Continue current therapy for now. Can consider macitentan as needed  if not tolerating Adempas.   Past Medical History:  Diagnosis Date  . Anxiety   . Arthritis   . CHF exacerbation ([place]) 08/21/2017  . Depression   . Difficult intubation    See [facility] anesthesia records in [place]  . Diverticulitis   . Dyspnea    with exertion  . Family history of adverse reaction to anesthesia    " My mother stopped breathing during procedure"  . GERD (gastroesophageal reflux disease)    PMH; resolved  . Hyperlipidemia   . Hypertension   . Obesity   . Plantar fasciitis, left   . Pneumonia   . Port-A-Cath in place   . Pulmonary embolism ([place])   . Pulmonary hypertension ([place])   . Sleep apnea    wears CPAP # 9  . Wears glasses     Past Surgical History:  Procedure Laterality Date  . ABDOMINAL HYSTERECTOMY    . BREAST REDUCTION SURGERY    . CHOLECYSTECTOMY    . COLONOSCOPY W/ BIOPSIES AND POLYPECTOMY    . EMBOLECTOMY N/A    pulmonary embolectomy  . FRACTURE SURGERY      right ankle  . IVC FILTER INSERTION    . REDUCTION MAMMAPLASTY Bilateral   . RIGHT HEART CATH N/A 10/28/2016   Procedure: Right Heart Cath;  Surgeon: Jolaine Artist, MD;  Location: [place] CV LAB;  Service: Cardiovascular;  Laterality: N/A;  . RIGHT HEART CATH N/A 08/26/2017   Procedure: RIGHT HEART CATH;  Surgeon: Jolaine Artist, MD;  Location: [place] CV LAB;  Service: Cardiovascular;  Laterality: N/A;  . ULTRASOUND GUIDANCE FOR VASCULAR ACCESS  08/26/2017   Procedure: Ultrasound Guidance For Vascular Access;  Surgeon: Jolaine Artist, MD;  Location: [place] CV LAB;  Service: Cardiovascular;;  . WISDOM TOOTH EXTRACTION      MEDICATIONS: . acetaminophen-codeine (TYLENOL #4) 300-60 MG tablet  . albuterol (PROVENTIL HFA;VENTOLIN HFA) 108 (90 Base) MCG/ACT inhaler  . atorvastatin (LIPITOR) 20 MG tablet  . benzonatate (TESSALON) 100 MG capsule  . cyclobenzaprine (FLEXERIL) 5 MG tablet  . diazepam (VALIUM) 5 MG tablet  . diclofenac sodium (VOLTAREN) 1 % GEL  . docusate sodium (COLACE) 100 MG capsule  . ferrous sulfate (FERROUSUL) 325 (65 FE) MG tablet  . fluticasone (FLONASE) 50 MCG/ACT nasal spray  . folic acid (FOLVITE) 1 MG tablet  . furosemide (LASIX) 40 MG tablet  . gabapentin (NEURONTIN) 300 MG capsule  . meclizine (ANTIVERT) 25 MG tablet  . Melatonin 5 MG TABS  . mirtazapine (REMERON) 45 MG tablet  . omeprazole (PRILOSEC) 40 MG capsule  . polyethylene glycol powder (GLYCOLAX/MIRALAX) powder  . Riociguat (ADEMPAS) 1.5 MG TABS  . rivaroxaban (XARELTO) 20 MG TABS tablet  . spironolactone (ALDACTONE) 25 MG tablet  . traZODone (DESYREL) 100 MG tablet  . zolpidem (AMBIEN) 10 MG tablet   . heparin lock flush 100 unit/mL    Wynonia Musty [facility] Short Stay Center/Anesthesiology Phone [phone] 02/11/2019 8:52 AM

## 2019-02-10 NOTE — Progress Notes (Signed)
Attempt to access PAC using 19 HPN. Able to access port but no blood return.  Recommended CXR to verify PAC location and intactness of PAC.  Patient to follow up with [facility] for further care.

## 2019-02-10 NOTE — Progress Notes (Signed)
Pt denies any acute cardiopulmonary issues. Pt stated that she is under the care of Dr. Haroldine Laws, Cardiology. Pt denies recent labs. Pt A1c was 6.5 on 11/02/18 and pt denies having diabetes. Pt stated that her PCP is at [facility]; pt stated that she cannot recall name of PCP. Pt stated that she does see Dr. Twanna Hy at [facility] as well. Pt stated that she recently had an EKG at [facility]; nurse requested LOV note and EKG; awaiting response. Nurse notified Dr. Marcie Bal, Anesthesiologist, regarding clotted port; MD advised that nurse ask IV nurse for their protocol.  PA, Karoline Caldwell, Anesthesiology, entered order for 1 view ( as per protocol for clotted [place]). Nurse lvm with [name], Surgical Coordinator, regarding port and requesting that pt surgery time be changed from first case; awaiting a return call. PA, stated that he would call surgeon's office as well given pt history of anesthesia complications. Nurse received a call from [facility], NP, regarding pt. NP was requesting information regarding pt port; NP stated that she will call [facility] to see if port can be declotted ( where pt has her port flushed). Nurse provided NP with phone number. NP stated that she will follow up with PA, Karoline Caldwell, Anesthesiology,  in the morning. Pt stated that she does not take Aspirin and " the doctor didn't say that I had to stop taking Xarelto, he said that it is not going to be a problem because it will not be a lot of bleeding."   Pt denies that she and family members tested positive for COVID-19 ( pt reminded to quarantine after test today).   Pt denies that she and family members experienced the following symptoms:  Cough yes/no: No Fever (>100.65F)  yes/no: No Runny nose yes/no: No Sore throat yes/no: No Difficulty breathing/shortness of breath  yes/no: No  Have you or a family member traveled in the last 14 days and where? yes/no: No   Pt reminded that hospital visitation  restrictions are in effect and the importance of the restrictions.   Pt verbalized understanding of all pre-op instructions.  PA, Anesthesiology, asked to review pt history; see notes.

## 2019-02-11 MED ORDER — DEXTROSE 5 % IV SOLN
3.0000 g | INTRAVENOUS | Status: AC
  Administered 2019-02-12: 3 g via INTRAVENOUS
  Filled 2019-02-11: qty 3000

## 2019-02-12 ENCOUNTER — Ambulatory Visit (HOSPITAL_COMMUNITY): Payer: Medicare HMO | Admitting: Physician Assistant

## 2019-02-12 ENCOUNTER — Encounter (HOSPITAL_COMMUNITY)
Admission: RE | Disposition: A | Discharge status: Home / Self Care | Payer: Self-pay | Source: Home / Self Care | Attending: Orthopedic Surgery

## 2019-02-12 ENCOUNTER — Other Ambulatory Visit: Payer: Self-pay

## 2019-02-12 ENCOUNTER — Encounter (HOSPITAL_COMMUNITY): Payer: Self-pay

## 2019-02-12 ENCOUNTER — Ambulatory Visit (HOSPITAL_COMMUNITY): Payer: Medicare HMO | Admitting: Anesthesiology

## 2019-02-12 ENCOUNTER — Ambulatory Visit (HOSPITAL_COMMUNITY)
Admission: RE | Admit: 2019-02-12 | Discharge: 2019-02-12 | Disposition: A | Discharge status: Home / Self Care | Payer: Medicare HMO | Attending: Orthopedic Surgery | Admitting: Orthopedic Surgery

## 2019-02-12 DIAGNOSIS — F419 Anxiety disorder, unspecified: Secondary | ICD-10-CM | POA: Insufficient documentation

## 2019-02-12 DIAGNOSIS — F329 Major depressive disorder, single episode, unspecified: Secondary | ICD-10-CM | POA: Insufficient documentation

## 2019-02-12 DIAGNOSIS — I110 Hypertensive heart disease with heart failure: Secondary | ICD-10-CM | POA: Insufficient documentation

## 2019-02-12 DIAGNOSIS — E785 Hyperlipidemia, unspecified: Secondary | ICD-10-CM | POA: Diagnosis not present

## 2019-02-12 DIAGNOSIS — K219 Gastro-esophageal reflux disease without esophagitis: Secondary | ICD-10-CM | POA: Insufficient documentation

## 2019-02-12 DIAGNOSIS — R7303 Prediabetes: Secondary | ICD-10-CM | POA: Insufficient documentation

## 2019-02-12 DIAGNOSIS — Z79899 Other long term (current) drug therapy: Secondary | ICD-10-CM | POA: Diagnosis not present

## 2019-02-12 DIAGNOSIS — Z7901 Long term (current) use of anticoagulants: Secondary | ICD-10-CM | POA: Diagnosis not present

## 2019-02-12 DIAGNOSIS — I272 Pulmonary hypertension, unspecified: Secondary | ICD-10-CM | POA: Diagnosis not present

## 2019-02-12 DIAGNOSIS — I2782 Chronic pulmonary embolism: Secondary | ICD-10-CM | POA: Diagnosis not present

## 2019-02-12 DIAGNOSIS — M722 Plantar fascial fibromatosis: Secondary | ICD-10-CM | POA: Diagnosis not present

## 2019-02-12 DIAGNOSIS — M6702 Short Achilles tendon (acquired), left ankle: Secondary | ICD-10-CM | POA: Diagnosis not present

## 2019-02-12 DIAGNOSIS — G4730 Sleep apnea, unspecified: Secondary | ICD-10-CM | POA: Diagnosis not present

## 2019-02-12 DIAGNOSIS — Z6841 Body Mass Index (BMI) 40.0 and over, adult: Secondary | ICD-10-CM | POA: Diagnosis not present

## 2019-02-12 DIAGNOSIS — I509 Heart failure, unspecified: Secondary | ICD-10-CM | POA: Diagnosis not present

## 2019-02-12 DIAGNOSIS — M1990 Unspecified osteoarthritis, unspecified site: Secondary | ICD-10-CM | POA: Insufficient documentation

## 2019-02-12 HISTORY — PX: GASTROCNEMIUS RECESSION: SHX863

## 2019-02-12 LAB — GLUCOSE, CAPILLARY: Glucose-Capillary: 97 mg/dL (ref 70–99)

## 2019-02-12 SURGERY — RECESSION, MUSCLE, GASTROCNEMIUS
Anesthesia: General | Site: Foot | Laterality: Left

## 2019-02-12 MED ORDER — PROMETHAZINE HCL 25 MG/ML IJ SOLN
6.2500 mg | INTRAMUSCULAR | Status: DC | PRN
  Administered 2019-02-12: 6.25 mg via INTRAVENOUS

## 2019-02-12 MED ORDER — MIDAZOLAM HCL 2 MG/2ML IJ SOLN
INTRAMUSCULAR | Status: AC
  Filled 2019-02-12: qty 2

## 2019-02-12 MED ORDER — ONDANSETRON HCL 4 MG/2ML IJ SOLN
INTRAMUSCULAR | Status: DC | PRN
  Administered 2019-02-12: 4 mg via INTRAVENOUS

## 2019-02-12 MED ORDER — LIDOCAINE 2% (20 MG/ML) 5 ML SYRINGE
INTRAMUSCULAR | Status: AC
  Filled 2019-02-12: qty 5

## 2019-02-12 MED ORDER — OXYCODONE HCL 5 MG/5ML PO SOLN: 5.0000 mg | Freq: Once | ORAL | Status: DC | PRN

## 2019-02-12 MED ORDER — BUPIVACAINE HCL (PF) 0.25 % IJ SOLN
INTRAMUSCULAR | Status: AC
  Filled 2019-02-12: qty 30

## 2019-02-12 MED ORDER — FENTANYL CITRATE (PF) 250 MCG/5ML IJ SOLN
INTRAMUSCULAR | Status: AC
  Filled 2019-02-12: qty 5

## 2019-02-12 MED ORDER — 0.9 % SODIUM CHLORIDE (POUR BTL) OPTIME
TOPICAL | Status: DC | PRN
  Administered 2019-02-12: 1000 mL

## 2019-02-12 MED ORDER — PROPOFOL 10 MG/ML IV BOLUS
INTRAVENOUS | Status: DC | PRN
  Administered 2019-02-12: 200 mg via INTRAVENOUS

## 2019-02-12 MED ORDER — MIDAZOLAM HCL 5 MG/5ML IJ SOLN
INTRAMUSCULAR | Status: DC | PRN
  Administered 2019-02-12: 2 mg via INTRAVENOUS

## 2019-02-12 MED ORDER — HYDROCODONE-ACETAMINOPHEN 5-325 MG PO TABS: 1.0000 | ORAL_TABLET | ORAL | 0 refills | Status: DC | PRN

## 2019-02-12 MED ORDER — HYDROMORPHONE HCL 1 MG/ML IJ SOLN
INTRAMUSCULAR | Status: AC
  Filled 2019-02-12: qty 1

## 2019-02-12 MED ORDER — PROMETHAZINE HCL 25 MG/ML IJ SOLN
INTRAMUSCULAR | Status: AC
  Filled 2019-02-12: qty 1

## 2019-02-12 MED ORDER — CHLORHEXIDINE GLUCONATE 4 % EX LIQD: 60.0000 mL | Freq: Once | CUTANEOUS | Status: DC

## 2019-02-12 MED ORDER — OXYCODONE HCL 5 MG PO TABS: 5.0000 mg | ORAL_TABLET | Freq: Once | ORAL | Status: DC | PRN

## 2019-02-12 MED ORDER — FENTANYL CITRATE (PF) 100 MCG/2ML IJ SOLN
INTRAMUSCULAR | Status: DC | PRN
  Administered 2019-02-12 (×2): 50 ug via INTRAVENOUS

## 2019-02-12 MED ORDER — LACTATED RINGERS IV SOLN
INTRAVENOUS | Status: DC | PRN
  Administered 2019-02-12: 08:00:00 via INTRAVENOUS

## 2019-02-12 MED ORDER — HYDROMORPHONE HCL 1 MG/ML IJ SOLN
0.2500 mg | INTRAMUSCULAR | Status: DC | PRN
  Administered 2019-02-12 (×3): 0.5 mg via INTRAVENOUS

## 2019-02-12 MED ORDER — LIDOCAINE 2% (20 MG/ML) 5 ML SYRINGE
INTRAMUSCULAR | Status: DC | PRN
  Administered 2019-02-12: 100 mg via INTRAVENOUS

## 2019-02-12 MED ORDER — ONDANSETRON HCL 4 MG/2ML IJ SOLN
INTRAMUSCULAR | Status: AC
  Filled 2019-02-12: qty 2

## 2019-02-12 MED ORDER — PROPOFOL 10 MG/ML IV BOLUS
INTRAVENOUS | Status: AC
  Filled 2019-02-12: qty 40

## 2019-02-12 SURGICAL SUPPLY — 28 items
BNDG COHESIVE 4X5 TAN STRL (GAUZE/BANDAGES/DRESSINGS) ×1 IMPLANT
BNDG COHESIVE 6X5 TAN STRL LF (GAUZE/BANDAGES/DRESSINGS) ×2 IMPLANT
BNDG GAUZE ELAST 4 BULKY (GAUZE/BANDAGES/DRESSINGS) ×1 IMPLANT
COVER MAYO STAND STRL (DRAPES) ×2 IMPLANT
COVER SURGICAL LIGHT HANDLE (MISCELLANEOUS) ×2 IMPLANT
COVER WAND RF STERILE (DRAPES) ×2 IMPLANT
DRAPE U-SHAPE 47X51 STRL (DRAPES) ×2 IMPLANT
DRSG ADAPTIC 3X8 NADH LF (GAUZE/BANDAGES/DRESSINGS) ×1 IMPLANT
DRSG EMULSION OIL 3X3 NADH (GAUZE/BANDAGES/DRESSINGS) ×2 IMPLANT
DURAPREP 26ML APPLICATOR (WOUND CARE) ×2 IMPLANT
ELECT REM PT RETURN 9FT ADLT (ELECTROSURGICAL) ×2
ELECTRODE REM PT RTRN 9FT ADLT (ELECTROSURGICAL) ×1 IMPLANT
GAUZE SPONGE 4X4 12PLY STRL (GAUZE/BANDAGES/DRESSINGS) ×2 IMPLANT
GLOVE BIOGEL PI IND STRL 9 (GLOVE) ×1 IMPLANT
GLOVE BIOGEL PI INDICATOR 9 (GLOVE) ×1
GLOVE SURG ORTHO 9.0 STRL STRW (GLOVE) ×2 IMPLANT
GOWN STRL REUS W/ TWL XL LVL3 (GOWN DISPOSABLE) ×2 IMPLANT
GOWN STRL REUS W/TWL XL LVL3 (GOWN DISPOSABLE) ×2
KIT BASIN OR (CUSTOM PROCEDURE TRAY) ×2 IMPLANT
KIT TURNOVER KIT B (KITS) ×2 IMPLANT
PACK ORTHO EXTREMITY (CUSTOM PROCEDURE TRAY) ×2 IMPLANT
PAD ARMBOARD 7.5X6 YLW CONV (MISCELLANEOUS) ×4 IMPLANT
PADDING CAST ABS 4INX4YD NS (CAST SUPPLIES) ×1
PADDING CAST ABS COTTON 4X4 ST (CAST SUPPLIES) ×1 IMPLANT
SPONGE LAP 18X18 RF (DISPOSABLE) ×2 IMPLANT
SUT ETHILON 2 0 PSLX (SUTURE) ×2 IMPLANT
UNDERPAD 30X30 (UNDERPADS AND DIAPERS) ×2 IMPLANT
WATER STERILE IRR 1000ML POUR (IV SOLUTION) ×2 IMPLANT

## 2019-02-12 NOTE — Progress Notes (Signed)
Orthopedic Tech Progress Note Patient Details:  [name] [name] 07-08-1961 [id number] PACU RN called requesting a post op shoe Ortho Devices Type of Ortho Device: Postop shoe/boot Ortho Device/Splint Location: LLE Ortho Device/Splint Interventions: Adjustment, Ordered, Application   Post Interventions Patient Tolerated: Well Instructions Provided: Care of device, Adjustment of device   [name] [name] 02/12/2019, 9:12 AM

## 2019-02-12 NOTE — H&P (Signed)
[name] [name] is an 58 y.o. female.   Chief Complaint:   HPI: The patient is a 58 year old woman who has severe chronic plantar fasciitis of her left foot.  She reports that she has had in a number of treatments including multiple steroid injections, oral medications, Achilles stretching exercises and other therapeutic modalities with physical therapy, none of which have relieved her symptoms.  She reports that this is causing continued pain and taking away the quality of her life. She is only able to ambulate for short distances and uses a cane for ambulation.   She presents for left gastrocnemius recession and plantar fascial release.   She has a very complex medical history which includes a history of pulmonary hypertension related to chronic pulmonary embolism.  She did undergo a thrombo-embolectomy in 03/2016 with an IVC filter and remains on chronic anticoagulation for life.  She reports that she is currently on Xarelto.   Her last 2D echocardiogram was in October 2019 with an estimated ejection fraction at 55 to 60% with normal wall motion, grade 1 diastolic dysfunction, systolic flattening of the ventricular septum consistent with RV pressure load, mild left atrial dilatation, right ventricle with moderately reduced systolic function and severe hypokinesis of the lateral free wall, mild to moderate right atrial dilatation.  Pulmonary artery showed systolic pressure moderately to severely increased with pulmonary artery peak pressure at 62 mmHg. Last lower extremity venous Doppler studies showed the left lower extremity findings consistent with chronic deep vein thrombosis involving the left peroneal vein.  The right side showed no evidence of common femoral vein obstruction.  Past Medical History:  Diagnosis Date  . Anxiety   . Arthritis   . CHF exacerbation ([place]) 08/21/2017  . Depression   . Difficult intubation    See [facility] anesthesia records in [place]  . Diverticulitis   .  Dyspnea    with exertion  . Family history of adverse reaction to anesthesia    " My mother stopped breathing during procedure"  . GERD (gastroesophageal reflux disease)    PMH; resolved  . Hyperlipidemia   . Hypertension   . Obesity   . Plantar fasciitis, left   . Pneumonia   . Port-A-Cath in place   . Pre-diabetes   . Pulmonary embolism ([place])   . Pulmonary hypertension ([place])   . Sleep apnea    wears CPAP # 9  . Wears glasses     Past Surgical History:  Procedure Laterality Date  . ABDOMINAL HYSTERECTOMY    . BREAST REDUCTION SURGERY    . CHOLECYSTECTOMY    . COLONOSCOPY W/ BIOPSIES AND POLYPECTOMY    . EMBOLECTOMY N/A    pulmonary embolectomy  . FRACTURE SURGERY     right ankle  . IVC FILTER INSERTION    . REDUCTION MAMMAPLASTY Bilateral   . RIGHT HEART CATH N/A 10/28/2016   Procedure: Right Heart Cath;  Surgeon: Jolaine Artist, MD;  Location: [place] CV LAB;  Service: Cardiovascular;  Laterality: N/A;  . RIGHT HEART CATH N/A 08/26/2017   Procedure: RIGHT HEART CATH;  Surgeon: Jolaine Artist, MD;  Location: [place] CV LAB;  Service: Cardiovascular;  Laterality: N/A;  . ULTRASOUND GUIDANCE FOR VASCULAR ACCESS  08/26/2017   Procedure: Ultrasound Guidance For Vascular Access;  Surgeon: Jolaine Artist, MD;  Location: [place] CV LAB;  Service: Cardiovascular;;  . WISDOM TOOTH EXTRACTION      Family History  Problem Relation Age of Onset  .  Lung cancer Maternal Grandmother   . Dementia Mother   . Dementia Father   . Anxiety disorder Sister   . Bipolar disorder Sister   . Drug abuse Sister   . Sexual abuse Maternal Aunt   . Anxiety disorder Cousin   . Anxiety disorder Sister   . Bipolar disorder Sister   . Drug abuse Sister   . Breast cancer Neg Hx    Social History:  reports that she has never smoked. She has never used smokeless tobacco. She reports that she does not drink alcohol or use drugs.  Allergies: No Known  Allergies  Medications Prior to Admission  Medication Sig Dispense Refill  . acetaminophen-codeine (TYLENOL #4) 300-60 MG tablet Take 1 tablet by mouth every 8 (eight) hours as needed for moderate pain. 90 tablet 0  . atorvastatin (LIPITOR) 20 MG tablet Take 1 tablet (20 mg total) by mouth daily at 6 PM. 90 tablet 1  . cyclobenzaprine (FLEXERIL) 5 MG tablet TAKE 1 TABLET BY MOUTH TWICE DAILY AS NEEDED FOR MUSCLE SPASM (Patient taking differently: Take 5 mg by mouth 2 (two) times daily as needed for muscle spasms. ) 40 tablet 2  . diazepam (VALIUM) 5 MG tablet Take 5 mg by mouth daily as needed for anxiety.    . diclofenac sodium (VOLTAREN) 1 % GEL Apply 2 g topically 4 (four) times daily as needed (knee and back pain).    Marland Kitchen docusate sodium (COLACE) 100 MG capsule Take 1 capsule (100 mg total) by mouth every 12 (twelve) hours. 30 capsule 0  . ferrous sulfate (FERROUSUL) 325 (65 FE) MG tablet Take 1 tablet (325 mg total) by mouth daily with breakfast. (Patient taking differently: Take 325 mg by mouth 2 (two) times a week. ) 90 tablet 3  . fluticasone (FLONASE) 50 MCG/ACT nasal spray Place 2 sprays into both nostrils daily. 16 g 6  . folic acid (FOLVITE) 1 MG tablet Take 1 mg by mouth daily.    . furosemide (LASIX) 40 MG tablet Take 1 tablet (40 mg total) by mouth daily. Take extra 40 mg tablet once in the afternoon AS NEEDED for weight gain 3 lbs or more. 180 tablet 1  . gabapentin (NEURONTIN) 300 MG capsule Take 2 capsules (600 mg total) by mouth 3 (three) times daily. (Patient taking differently: Take 300 mg by mouth 3 (three) times daily. ) 540 capsule 1  . meclizine (ANTIVERT) 25 MG tablet TAKE 1 TABLET BY MOUTH TWICE DAILY AS NEEDED FOR  DIZZINESS (Patient taking differently: Take 25 mg by mouth 2 (two) times daily as needed for dizziness. TAKE 1 TABLET BY MOUTH TWICE DAILY AS NEEDED FOR  DIZZINESS) 60 tablet 0  . Melatonin 5 MG TABS Take 5 mg by mouth at bedtime.    . mirtazapine (REMERON) 45 MG  tablet Take 45 mg by mouth every evening.    Marland Kitchen omeprazole (PRILOSEC) 40 MG capsule Take 1 capsule (40 mg total) by mouth 2 (two) times daily. 180 capsule 1  . polyethylene glycol powder (GLYCOLAX/MIRALAX) powder Take 17 g by mouth 2 (two) times daily as needed. 3350 g 1  . Riociguat (ADEMPAS) 1.5 MG TABS Take 1.5 mg by mouth 3 (three) times daily. 90 tablet 11  . rivaroxaban (XARELTO) 20 MG TABS tablet Take 1 tablet (20 mg total) by mouth daily with supper. 90 tablet 3  . spironolactone (ALDACTONE) 25 MG tablet Take 0.5 tablets (12.5 mg total) by mouth daily. (Patient taking differently: Take 12.5 mg  by mouth every other day. ) 45 tablet 3  . traZODone (DESYREL) 100 MG tablet Take 100 mg by mouth at bedtime as needed for sleep.     Marland Kitchen zolpidem (AMBIEN) 10 MG tablet Take 10 mg by mouth at bedtime as needed for sleep.    Marland Kitchen albuterol (PROVENTIL HFA;VENTOLIN HFA) 108 (90 Base) MCG/ACT inhaler Inhale 1-2 puffs into the lungs every 6 (six) hours as needed for wheezing. (Patient not taking: Reported on 02/09/2019) 3 Inhaler 6  . benzonatate (TESSALON) 100 MG capsule Take 1 capsule (100 mg total) by mouth 2 (two) times daily as needed for cough. (Patient not taking: Reported on 02/09/2019) 20 capsule 0    Results for orders placed or performed during the hospital encounter of 02/12/19 (from the past 48 hour(s))  Glucose, capillary     Status: None   Collection Time: 02/12/19  5:56 AM  Result Value Ref Range   Glucose-Capillary 97 70 - 99 mg/dL   Dg Chest Port 1 View  Result Date: 02/10/2019 CLINICAL DATA:  Check port placement EXAM: PORTABLE CHEST 1 VIEW COMPARISON:  06/03/2018 FINDINGS: Cardiac shadow is stable. Postsurgical changes are again seen. Right chest wall port is noted with catheter tip in satisfactory position. No focal infiltrate or effusion is seen. Some scarring in the right base is again noted. IMPRESSION: No acute abnormality noted. Electronically Signed   By: Inez Catalina M.D.   On:  02/10/2019 14:08    Review of Systems  All other systems reviewed and are negative.   Blood pressure 135/72, pulse 84, temperature 97.8 F (36.6 C), temperature source Oral, resp. rate 20, height 5\' 3"  (1.6 m), weight (!) 152.4 kg, SpO2 97 %. Physical Exam  Constitutional: She is oriented to person, place, and time. She appears well-developed and well-nourished. No distress.  HENT:  Head: Normocephalic and atraumatic.  Neck: No tracheal deviation present. No thyromegaly present.  Cardiovascular: Normal rate and regular rhythm.  Respiratory: No stridor. No respiratory distress.  Mildly short of breath with ambulation with a cane. No SOB with general conversation.   GI: Soft.  Musculoskeletal:     Comments: Examination patient has a good dorsalis pedis pulse she has dorsiflexion only to neutral with a Achilles contracture.  The tarsal tunnel is nontender to palpation the Achilles tendon is minimally tender to palpation lateral compression of the calcaneus is minimally tender and radiographs are reviewed which shows no evidence of a fracture she has minimal bony spurs.  She is maximally tender to palpation over the origin of the plantar fascia.  Neurological: She is alert and oriented to person, place, and time. No cranial nerve deficit.  Skin: Skin is warm and dry.  Psychiatric: She has a normal mood and affect. Her behavior is normal. Judgment and thought content normal.     Assessment/Plan Chronic left plantar fasciitis- Plan left gastrocnemius recession and plantar fascial release-the procedure and benefits and risks including the risks of bleeding, infection, blood clots, neurovascular injury, and possible need for further surgery were discussed at length with the patient and her questions were answered to her satisfaction.  The patient desires to proceed with surgery at this time.  Erlinda Hong, PA-C 02/12/2019, 7:18 AM [facility] [phone]

## 2019-02-12 NOTE — Anesthesia Procedure Notes (Signed)
Procedure Name: LMA Insertion Date/Time: 02/12/2019 8:00 AM Performed by: Alain Marion, CRNA Pre-anesthesia Checklist: Patient identified, Emergency Drugs available, Suction available and Patient being monitored Patient Re-evaluated:Patient Re-evaluated prior to induction Oxygen Delivery Method: Circle System Utilized Preoxygenation: Pre-oxygenation with 100% oxygen Induction Type: IV induction Ventilation: Mask ventilation without difficulty LMA: LMA inserted LMA Size: 4.0 Number of attempts: 1 Airway Equipment and Method: Bite block Placement Confirmation: positive ETCO2 Tube secured with: Tape Dental Injury: Teeth and Oropharynx as per pre-operative assessment

## 2019-02-12 NOTE — Transfer of Care (Signed)
Immediate Anesthesia Transfer of Care Note  Patient: [name] [name] [facility]  Procedure(s) Performed: LEFT GASTROCNEMIUS RECESSION, PLANTAR FASCIA RELEASE (Left Foot)  Patient Location: PACU  Anesthesia Type:General  Level of Consciousness: awake, alert  and oriented  Airway & Oxygen Therapy: Patient Spontanous Breathing and Patient connected to face mask oxygen  Post-op Assessment: Report given to RN and Post -op Vital signs reviewed and stable  Post vital signs: Reviewed and stable  Last Vitals:  Vitals Value Taken Time  BP 147/109 02/12/2019  8:29 AM  Temp    Pulse 79 02/12/2019  8:31 AM  Resp 19 02/12/2019  8:31 AM  SpO2 100 % 02/12/2019  8:31 AM  Vitals shown include unvalidated device data.  Last Pain:  Vitals:   02/12/19 0629  TempSrc:   PainSc: 8          Complications: No apparent anesthesia complications

## 2019-02-12 NOTE — Op Note (Signed)
02/12/2019  8:27 AM  PATIENT:  [name] [name]    PRE-OPERATIVE DIAGNOSIS:  Plantar Fascitis Left Foot with Achilles contracture  POST-OPERATIVE DIAGNOSIS:  Same  PROCEDURE:  LEFT GASTROCNEMIUS RECESSION, PLANTAR FASCIA RELEASE C-arm fluoroscopy utilized to release the plantar fascia  SURGEON:  Newt Minion, MD  PHYSICIAN ASSISTANT:None ANESTHESIA:   General  PREOPERATIVE INDICATIONS:  [name] is a  58 y.o. female with a diagnosis of Plantar Fascitis Left Foot who failed conservative measures and elected for surgical management.    The risks benefits and alternatives were discussed with the patient preoperatively including but not limited to the risks of infection, bleeding, nerve injury, cardiopulmonary complications, the need for revision surgery, among others, and the patient was willing to proceed.  OPERATIVE IMPLANTS: None  @ENCIMAGES @  OPERATIVE FINDINGS: Dorsiflexion initial 20 degrees short of neutral dorsiflexion post gastroc recession 20 degrees past neutral C-arm fluoroscopy used to verify plantar fascial release.  OPERATIVE PROCEDURE: Patient was brought the operating room and underwent a general LMA anesthetic.  After adequate levels anesthesia were obtained patient's left lower extremity was prepped using ChloraPrep draped into a sterile field a timeout was called.  Patient received 3 g of Kefzol preoperatively.  A longitudinal medial incision was made 15 cm proximal to the medial malleolus.  Blunt dissection was carried down to the gastroc fascia.  The gastrocnemius fascia was released under direct visualization.  This took her dorsiflexion from 20 degrees short of neutral to 20 degrees past neutral.  The wound was irrigated normal saline incision closed using 2-0 nylon.  Attention was then focused on the plantar fascia.  The origin of the plantar fascia was identified with a 18-gauge spinal under C arm fluoroscopy.  A incision was made with a 15 blade knife  through the skin and a 32 Beaver blade was used to release the origin of the plantar fascia under C-arm fluoroscopy guidance.  The incisions were closed using 2-0 nylon a sterile dressing was applied patient was extubated taken the PACU in stable condition.   DISCHARGE PLANNING:  Antibiotic duration: 3 g Kefzol preoperatively  Weightbearing: Weightbearing as tolerated  Pain medication: Prescription for Vicodin  Dressing care/ Wound VAC: Follow-up in 1 week to change the dressing  Ambulatory devices: Patient has a walker at home  Discharge to: Home.  Follow-up: In the office 1 week post operative.

## 2019-02-12 NOTE — Anesthesia Postprocedure Evaluation (Signed)
Anesthesia Post Note  Patient: [name] [name] [facility]  Procedure(s) Performed: LEFT GASTROCNEMIUS RECESSION, PLANTAR FASCIA RELEASE (Left Foot)     Patient location during evaluation: PACU Anesthesia Type: General Level of consciousness: awake and alert Pain management: pain level controlled Vital Signs Assessment: post-procedure vital signs reviewed and stable Respiratory status: spontaneous breathing, nonlabored ventilation and respiratory function stable Cardiovascular status: blood pressure returned to baseline and stable Postop Assessment: no apparent nausea or vomiting Anesthetic complications: no    Last Vitals:  Vitals:   02/12/19 0930 02/12/19 0953  BP: (!) 91/57 (!) 130/50  Pulse: 68 68  Resp: 19 12  Temp:  (!) 36.2 C  SpO2: 100% 100%    Last Pain:  Vitals:   02/12/19 0930  TempSrc:   PainSc: [place]

## 2019-02-13 ENCOUNTER — Encounter (HOSPITAL_COMMUNITY): Payer: Self-pay | Admitting: Orthopedic Surgery

## 2019-02-15 ENCOUNTER — Other Ambulatory Visit: Payer: Self-pay

## 2019-02-15 ENCOUNTER — Ambulatory Visit (INDEPENDENT_AMBULATORY_CARE_PROVIDER_SITE_OTHER): Payer: Medicare HMO | Admitting: Orthopedic Surgery

## 2019-02-15 ENCOUNTER — Encounter: Payer: Self-pay | Admitting: Physician Assistant

## 2019-02-15 VITALS — Ht 63.0 in | Wt 336.0 lb

## 2019-02-15 DIAGNOSIS — M722 Plantar fascial fibromatosis: Secondary | ICD-10-CM

## 2019-02-15 DIAGNOSIS — M6702 Short Achilles tendon (acquired), left ankle: Secondary | ICD-10-CM

## 2019-02-15 NOTE — Progress Notes (Signed)
Office Visit Note   Patient: [name] [name]           Date of Birth: 03-07-1961           MRN: [id number] Visit Date: 02/15/2019              Requested by: Ruthine Dose, [place],  [id number] PCP: Ruthine Dose, MD  Chief Complaint  Patient presents with  . Left Foot - Routine Post Op    02/12/19 left foot plantar fascia release      HPI: Patient is a 58 year old woman who presents status post gastrocnemius recession and plantar fascial release.  Patient was concerned that her nausea and pain were not taken seriously while she was in the recovery room.  Assessment & Plan: Visit Diagnoses:  1. Plantar fasciitis of left foot   2. Achilles tendon contracture, left     Plan: Patient is given a new postoperative shoe she is given crutches.  She may begin Dial soap cleansing daily she is to work on Achilles stretching continue with the 4 x 4 and Ace wrap dressing.  We will request home health nursing for physical therapy and see if a home health aide is available as well.  Follow-Up Instructions: Return in about 2 weeks (around 03/01/2019).   Ortho Exam  Patient is alert, oriented, no adenopathy, well-dressed, normal affect, normal respiratory effort. Examination the incisions are well approximated there is no active bleeding there is no redness no cellulitis no signs of infection.  Imaging: No results found. No images are attached to the encounter.  Labs: Lab Results  Component Value Date   HGBA1C 6.3 (H) 02/10/2019   HGBA1C 6.5 (H) 11/02/2018   HGBA1C 5.6 06/27/2016     Lab Results  Component Value Date   ALBUMIN 3.9 12/29/2018   ALBUMIN 3.7 11/02/2018   ALBUMIN 3.6 06/05/2018    Body mass index is 59.52 kg/m.  Orders:  No orders of the defined types were placed in this encounter.  No orders of the defined types were placed in this encounter.    Procedures: No procedures performed  Clinical Data: No additional findings.   ROS:  All other systems negative, except as noted in the HPI. Review of Systems  Objective: Vital Signs: Ht 5\' 3"  (1.6 m)   Wt (!) 336 lb (152.4 kg)   BMI 59.52 kg/m   Specialty Comments:  No specialty comments available.  PMFS History: Patient Active Problem List   Diagnosis Date Noted  . Achilles tendon contracture, left   . Leg pain, left 07/10/2018  . Bilateral primary osteoarthritis of knee 05/12/2018  . Plantar fasciitis of left foot 05/12/2018  . Shortness of breath 01/16/2018  . Palliative care encounter 01/16/2018  . Major depressive disorder, recurrent severe without psychotic features ([place]) 12/06/2017  . CHF exacerbation ([place]) 08/21/2017  . (HFpEF) heart failure with preserved ejection fraction ([place]) 08/20/2017  . Lumbar radiculopathy 08/08/2017  . Body mass index 50.0-59.9, adult ([place]) 08/08/2017  . Hoarseness 08/08/2017  . Chronic cough 04/10/2017  . Rheumatoid arthritis involving multiple sites ([place]) 04/10/2017  . Anxiety and depression 04/10/2017  . Adjustment disorder with depressed mood 12/11/2016  . Morbid obesity ([place]) 08/14/2016  . Stroke ([place]) 06/27/2016  . Right sided weakness 06/27/2016  . Essential hypertension 06/27/2016  . Atypical chest pain 06/27/2016  . Difficult airway 03/29/2016  . CTEPH (chronic thromboembolic pulmonary hypertension) ([place]) 03/14/2016  . OSA on CPAP 02/20/2016  . Chronic pain  syndrome 01/31/2016  . Pulmonary embolus ([place]) 11/15/2015  . Vertigo 11/15/2015  . Depression 11/15/2015   Past Medical History:  Diagnosis Date  . Anxiety   . Arthritis   . CHF exacerbation ([place]) 08/21/2017  . Depression   . Difficult intubation    See [facility] anesthesia records in [place]  . Diverticulitis   . Dyspnea    with exertion  . Family history of adverse reaction to anesthesia    " My mother stopped breathing during procedure"  . GERD (gastroesophageal reflux disease)    PMH; resolved  . Hyperlipidemia   . Hypertension    . Obesity   . Plantar fasciitis, left   . Pneumonia   . Port-A-Cath in place   . Pre-diabetes   . Pulmonary embolism ([place])   . Pulmonary hypertension ([place])   . Sleep apnea    wears CPAP # 9  . Wears glasses     Family History  Problem Relation Age of Onset  . Lung cancer Maternal Grandmother   . Dementia Mother   . Dementia Father   . Anxiety disorder Sister   . Bipolar disorder Sister   . Drug abuse Sister   . Sexual abuse Maternal Aunt   . Anxiety disorder Cousin   . Anxiety disorder Sister   . Bipolar disorder Sister   . Drug abuse Sister   . Breast cancer Neg Hx     Past Surgical History:  Procedure Laterality Date  . ABDOMINAL HYSTERECTOMY    . BREAST REDUCTION SURGERY    . CHOLECYSTECTOMY    . COLONOSCOPY W/ BIOPSIES AND POLYPECTOMY    . EMBOLECTOMY N/A    pulmonary embolectomy  . FRACTURE SURGERY     right ankle  . GASTROCNEMIUS RECESSION Left 02/12/2019   Procedure: LEFT GASTROCNEMIUS RECESSION, PLANTAR FASCIA RELEASE;  Surgeon: Newt Minion, MD;  Location: [facility];  Service: Orthopedics;  Laterality: Left;  . IVC FILTER INSERTION    . REDUCTION MAMMAPLASTY Bilateral   . RIGHT HEART CATH N/A 10/28/2016   Procedure: Right Heart Cath;  Surgeon: Jolaine Artist, MD;  Location: [facility];  Service: Cardiovascular;  Laterality: N/A;  . RIGHT HEART CATH N/A 08/26/2017   Procedure: RIGHT HEART CATH;  Surgeon: Jolaine Artist, MD;  Location: [facility];  Service: Cardiovascular;  Laterality: N/A;  . ULTRASOUND GUIDANCE FOR VASCULAR ACCESS  08/26/2017   Procedure: Ultrasound Guidance For Vascular Access;  Surgeon: Jolaine Artist, MD;  Location: [facility];  Service: Cardiovascular;;  . WISDOM TOOTH EXTRACTION     Social History   Occupational History  . Not on file  Tobacco Use  . Smoking status: Never Smoker  . Smokeless tobacco: Never Used  Substance and Sexual Activity  . Alcohol use: No  . Drug use: No  . Sexual  activity: Yes    Partners: Male    Birth control/protection: Surgical

## 2019-02-16 ENCOUNTER — Other Ambulatory Visit: Payer: Self-pay | Admitting: Internal Medicine

## 2019-02-22 ENCOUNTER — Telehealth: Payer: Self-pay | Admitting: Orthopedic Surgery

## 2019-02-22 ENCOUNTER — Other Ambulatory Visit: Payer: Self-pay | Admitting: Physician Assistant

## 2019-02-22 DIAGNOSIS — M722 Plantar fascial fibromatosis: Secondary | ICD-10-CM

## 2019-02-22 DIAGNOSIS — M6702 Short Achilles tendon (acquired), left ankle: Secondary | ICD-10-CM

## 2019-02-22 MED ORDER — HYDROCODONE-ACETAMINOPHEN 5-325 MG PO TABS: 1.0000 | ORAL_TABLET | ORAL | 0 refills | Status: DC | PRN

## 2019-02-22 NOTE — Telephone Encounter (Signed)
Refilled hydrocodone/APAP- Sent to [facility] on [address]

## 2019-02-22 NOTE — Telephone Encounter (Signed)
Shawn please advise, thank you 

## 2019-02-22 NOTE — Telephone Encounter (Signed)
Patient was called and notified about Rx.

## 2019-02-22 NOTE — Telephone Encounter (Signed)
Pt called in requesting a refill on her medication Hydrocodone-acetaminophen. Please have that sent to [facility] on [address]  [phone]

## 2019-02-23 ENCOUNTER — Telehealth: Payer: Self-pay | Admitting: Physician Assistant

## 2019-02-23 NOTE — Telephone Encounter (Signed)
Received voicemail message from [facility] (PT) needing verbal orders for HHPT. Also need clarification on weight bearing status. The number to contact Tharon Aquas is [phone]

## 2019-02-23 NOTE — Telephone Encounter (Signed)
PT for 2x a week for 1 week and 1x week for 3 weeks; orders were given for non-wtb

## 2019-03-01 ENCOUNTER — Ambulatory Visit: Payer: Medicare HMO | Admitting: Physician Assistant

## 2019-03-02 ENCOUNTER — Telehealth: Payer: Self-pay | Admitting: Orthopedic Surgery

## 2019-03-02 NOTE — Telephone Encounter (Signed)
Merrilee Seashore with [facility] states patient is unable to be non weight bearing. Please call Merrilee Seashore @ [phone] to discuss

## 2019-03-02 NOTE — Telephone Encounter (Signed)
She may be weight bearing as tolerated with fracture boot on

## 2019-03-02 NOTE — Telephone Encounter (Signed)
Pt is s/p a gastroc recession with PF release on 02/12/19 is she supposed to be non weight bearing?

## 2019-03-03 NOTE — Telephone Encounter (Signed)
I called and sw Merrilee Seashore PT with [facility] to advise of message below will call with any other questions.

## 2019-03-04 ENCOUNTER — Encounter (HOSPITAL_COMMUNITY): Payer: 59

## 2019-03-05 ENCOUNTER — Other Ambulatory Visit: Payer: Self-pay | Admitting: Gastroenterology

## 2019-03-05 DIAGNOSIS — Z86010 Personal history of colonic polyps: Secondary | ICD-10-CM

## 2019-03-09 ENCOUNTER — Other Ambulatory Visit: Payer: Self-pay

## 2019-03-09 ENCOUNTER — Ambulatory Visit (INDEPENDENT_AMBULATORY_CARE_PROVIDER_SITE_OTHER): Payer: Medicare HMO | Admitting: Family

## 2019-03-09 ENCOUNTER — Ambulatory Visit: Payer: Medicare HMO | Admitting: Physician Assistant

## 2019-03-09 ENCOUNTER — Encounter: Payer: Self-pay | Admitting: Family

## 2019-03-09 VITALS — Ht 63.0 in | Wt 336.0 lb

## 2019-03-09 DIAGNOSIS — M6702 Short Achilles tendon (acquired), left ankle: Secondary | ICD-10-CM

## 2019-03-09 DIAGNOSIS — M722 Plantar fascial fibromatosis: Secondary | ICD-10-CM

## 2019-03-09 NOTE — Progress Notes (Signed)
Post-Op Visit Note   Patient: [name] [name]           Date of Birth: 12/17/1960           MRN: [id number] Visit Date: 03/09/2019 PCP: Ruthine Dose, MD  Chief Complaint:  Chief Complaint  Patient presents with  . Left Foot - Routine Post Op    02/12/19 left foot PF release     HPI:  HPI The patient is a 58 year old woman who presents status post gastrocnemius recession and plantar fascial wrist release on June 5.  She has been progressing well with a home health aide having assistance bathing also has therapy physical therapy as well as occupational therapy sutures do remain in place she has been weightbearing with crutches working on stretching and range of motion of her foot and ankle.  Pleased with her reduction in pain as compared to her preop foot pain. Ortho Exam On examination incisions and portal are well-healed there is no erythema no drainage no sign of infection.  Has dorsiflexion to neutral.  Visit Diagnoses:  1. Plantar fasciitis of left foot   2. Achilles tendon contracture, left     Plan: She will continue with her home exercise program as well as physical therapy work on stretching and range of motion of the foot and ankle.  Heel cord stretching.  Sutures removed today reassurance provided she will follow-up once more in 2 weeks.  Advance weightbearing as tolerated  Follow-Up Instructions: Return in about 2 weeks (around 03/23/2019).   Imaging: No results found.  Orders:  No orders of the defined types were placed in this encounter.  No orders of the defined types were placed in this encounter.    PMFS History: Patient Active Problem List   Diagnosis Date Noted  . Achilles tendon contracture, left   . Leg pain, left 07/10/2018  . Bilateral primary osteoarthritis of knee 05/12/2018  . Plantar fasciitis of left foot 05/12/2018  . Shortness of breath 01/16/2018  . Palliative care encounter 01/16/2018  . Major depressive disorder, recurrent severe  without psychotic features ([place]) 12/06/2017  . CHF exacerbation ([place]) 08/21/2017  . (HFpEF) heart failure with preserved ejection fraction ([place]) 08/20/2017  . Lumbar radiculopathy 08/08/2017  . Body mass index 50.0-59.9, adult ([place]) 08/08/2017  . Hoarseness 08/08/2017  . Chronic cough 04/10/2017  . Rheumatoid arthritis involving multiple sites ([place]) 04/10/2017  . Anxiety and depression 04/10/2017  . Adjustment disorder with depressed mood 12/11/2016  . Morbid obesity ([place]) 08/14/2016  . Stroke ([place]) 06/27/2016  . Right sided weakness 06/27/2016  . Essential hypertension 06/27/2016  . Atypical chest pain 06/27/2016  . Difficult airway 03/29/2016  . CTEPH (chronic thromboembolic pulmonary hypertension) ([place]) 03/14/2016  . OSA on CPAP 02/20/2016  . Chronic pain syndrome 01/31/2016  . Pulmonary embolus ([place]) 11/15/2015  . Vertigo 11/15/2015  . Depression 11/15/2015   Past Medical History:  Diagnosis Date  . Anxiety   . Arthritis   . CHF exacerbation ([place]) 08/21/2017  . Depression   . Difficult intubation    See [facility] anesthesia records in [place]  . Diverticulitis   . Dyspnea    with exertion  . Family history of adverse reaction to anesthesia    " My mother stopped breathing during procedure"  . GERD (gastroesophageal reflux disease)    PMH; resolved  . Hyperlipidemia   . Hypertension   . Obesity   . Plantar fasciitis, left   . Pneumonia   . Port-A-Cath in  place   . Pre-diabetes   . Pulmonary embolism ([place])   . Pulmonary hypertension ([place])   . Sleep apnea    wears CPAP # 9  . Wears glasses     Family History  Problem Relation Age of Onset  . Lung cancer Maternal Grandmother   . Dementia Mother   . Dementia Father   . Anxiety disorder Sister   . Bipolar disorder Sister   . Drug abuse Sister   . Sexual abuse Maternal Aunt   . Anxiety disorder Cousin   . Anxiety disorder Sister   . Bipolar disorder Sister   . Drug abuse Sister   . Breast cancer Neg Hx      Past Surgical History:  Procedure Laterality Date  . ABDOMINAL HYSTERECTOMY    . BREAST REDUCTION SURGERY    . CHOLECYSTECTOMY    . COLONOSCOPY W/ BIOPSIES AND POLYPECTOMY    . EMBOLECTOMY N/A    pulmonary embolectomy  . FRACTURE SURGERY     right ankle  . GASTROCNEMIUS RECESSION Left 02/12/2019   Procedure: LEFT GASTROCNEMIUS RECESSION, PLANTAR FASCIA RELEASE;  Surgeon: Newt Minion, MD;  Location: [facility];  Service: Orthopedics;  Laterality: Left;  . IVC FILTER INSERTION    . REDUCTION MAMMAPLASTY Bilateral   . RIGHT HEART CATH N/A 10/28/2016   Procedure: Right Heart Cath;  Surgeon: Jolaine Artist, MD;  Location: [facility];  Service: Cardiovascular;  Laterality: N/A;  . RIGHT HEART CATH N/A 08/26/2017   Procedure: RIGHT HEART CATH;  Surgeon: Jolaine Artist, MD;  Location: [facility];  Service: Cardiovascular;  Laterality: N/A;  . ULTRASOUND GUIDANCE FOR VASCULAR ACCESS  08/26/2017   Procedure: Ultrasound Guidance For Vascular Access;  Surgeon: Jolaine Artist, MD;  Location: [facility];  Service: Cardiovascular;;  . WISDOM TOOTH EXTRACTION     Social History   Occupational History  . Not on file  Tobacco Use  . Smoking status: Never Smoker  . Smokeless tobacco: Never Used  Substance and Sexual Activity  . Alcohol use: No  . Drug use: No  . Sexual activity: Yes    Partners: Male    Birth control/protection: Surgical

## 2019-03-10 ENCOUNTER — Telehealth: Payer: Self-pay | Admitting: Orthopedic Surgery

## 2019-03-10 NOTE — Telephone Encounter (Signed)
[facility]enderson Newcomer called and needs verbal order for OT 1x week for 7 weeks.  Please call and a message can be left on vm  [phone]

## 2019-03-10 NOTE — Telephone Encounter (Signed)
Pt is s/p a PF release and gastroc. Called sw OT to advise verbal ok for orders below.

## 2019-03-17 ENCOUNTER — Telehealth: Payer: Self-pay | Admitting: Family

## 2019-03-17 NOTE — Telephone Encounter (Signed)
Received voicemail message from [place] with [facility] needing verbal orders for HHPT 1 time a week every other week for 4 weeks for strengthening,stair climbing and balance. The number to contact Tharon Aquas is [phone]

## 2019-03-17 NOTE — Telephone Encounter (Signed)
[name] [name] was called and given VO for HHPT.

## 2019-03-22 ENCOUNTER — Telehealth: Payer: Self-pay | Admitting: Orthopedic Surgery

## 2019-03-22 ENCOUNTER — Ambulatory Visit
Admission: RE | Admit: 2019-03-22 | Discharge: 2019-03-22 | Disposition: A | Discharge status: Home / Self Care | Payer: Medicare HMO | Source: Ambulatory Visit | Attending: Gastroenterology | Admitting: Gastroenterology

## 2019-03-22 DIAGNOSIS — Z86010 Personal history of colonic polyps: Secondary | ICD-10-CM

## 2019-03-22 NOTE — Telephone Encounter (Signed)
Patient called to state  Having pain in shin /back of leg.  Please call patient [phone]

## 2019-03-22 NOTE — Telephone Encounter (Signed)
Patient was called and recommended to keep elevating, ice massages, heel cord stretching along with other exercises. She asked if we could do an xray when she arrives on Wednesday for her appt and she was informed that we could. She states she is concerned about her healing process and developing blood clots in her leg. I informed patient that it is normal to have pain after surgery and she is not doing anything wrong it is a healing process. I will advise [name] [name] of this concern.

## 2019-03-24 ENCOUNTER — Other Ambulatory Visit: Payer: Self-pay

## 2019-03-24 ENCOUNTER — Encounter: Payer: Self-pay | Admitting: Family

## 2019-03-24 ENCOUNTER — Ambulatory Visit (INDEPENDENT_AMBULATORY_CARE_PROVIDER_SITE_OTHER): Payer: Medicare HMO | Admitting: Family

## 2019-03-24 VITALS — Ht 63.0 in | Wt 336.0 lb

## 2019-03-24 DIAGNOSIS — M6702 Short Achilles tendon (acquired), left ankle: Secondary | ICD-10-CM

## 2019-03-24 DIAGNOSIS — M722 Plantar fascial fibromatosis: Secondary | ICD-10-CM

## 2019-03-24 NOTE — Progress Notes (Signed)
Post-Op Visit Note   Patient: [name] [name]           Date of Birth: Feb 19, 1961           MRN: [id number] Visit Date: 03/24/2019 PCP: Ruthine Dose, MD  Chief Complaint:  Chief Complaint  Patient presents with  . Left Foot - Routine Post Op    02/12/2019 plantar fascia release  . Left Ankle - Routine Post Op    HPI:  HPI The patient is a 58 year old woman who presents status post gastrocnemius recession and plantar fascial release on June 5.  She has been progressing slowly. Still using crutches for support with weight bearing. Does not feel steady with pain in left heel.  has therapy physical therapy as well as occupational therapy. she has been weightbearing with crutches working on stretching and range of motion of her foot and ankle.    Continues to be Pleased with her reduction in pain as compared to her preop foot pain.  Ortho Exam On examination incisions and portal are well-healed there is no erythema no drainage no sign of infection. Mild edema to foot and ankle.  Has dorsiflexion to neutral.  Visit Diagnoses:  1. Achilles tendon contracture, left   2. Plantar fasciitis of left foot     Plan: She will continue with her home exercise program as well as physical therapy work on stretching and range of motion of the foot and ankle.  Heel cord stretching. reassurance provided re the edema and tenderness. Instructed on scar massage. Offered plantar fascia injection, patient declined. she will follow-up as needed. Follow-Up Instructions: Return if symptoms worsen or fail to improve.   Imaging: No results found.  Orders:  No orders of the defined types were placed in this encounter.  No orders of the defined types were placed in this encounter.    PMFS History: Patient Active Problem List   Diagnosis Date Noted  . Achilles tendon contracture, left   . Leg pain, left 07/10/2018  . Bilateral primary osteoarthritis of knee 05/12/2018  . Plantar fasciitis of left  foot 05/12/2018  . Shortness of breath 01/16/2018  . Palliative care encounter 01/16/2018  . Major depressive disorder, recurrent severe without psychotic features ([place]) 12/06/2017  . CHF exacerbation ([place]) 08/21/2017  . (HFpEF) heart failure with preserved ejection fraction ([place]) 08/20/2017  . Lumbar radiculopathy 08/08/2017  . Body mass index 50.0-59.9, adult ([place]) 08/08/2017  . Hoarseness 08/08/2017  . Chronic cough 04/10/2017  . Rheumatoid arthritis involving multiple sites ([place]) 04/10/2017  . Anxiety and depression 04/10/2017  . Adjustment disorder with depressed mood 12/11/2016  . Morbid obesity ([place]) 08/14/2016  . Stroke ([place]) 06/27/2016  . Right sided weakness 06/27/2016  . Essential hypertension 06/27/2016  . Atypical chest pain 06/27/2016  . Difficult airway 03/29/2016  . CTEPH (chronic thromboembolic pulmonary hypertension) ([place]) 03/14/2016  . OSA on CPAP 02/20/2016  . Chronic pain syndrome 01/31/2016  . Pulmonary embolus ([place]) 11/15/2015  . Vertigo 11/15/2015  . Depression 11/15/2015   Past Medical History:  Diagnosis Date  . Anxiety   . Arthritis   . CHF exacerbation ([place]) 08/21/2017  . Depression   . Difficult intubation    See [facility] anesthesia records in [place]  . Diverticulitis   . Dyspnea    with exertion  . Family history of adverse reaction to anesthesia    " My mother stopped breathing during procedure"  . GERD (gastroesophageal reflux disease)    PMH; resolved  .  Hyperlipidemia   . Hypertension   . Obesity   . Plantar fasciitis, left   . Pneumonia   . Port-A-Cath in place   . Pre-diabetes   . Pulmonary embolism ([place])   . Pulmonary hypertension ([place])   . Sleep apnea    wears CPAP # 9  . Wears glasses     Family History  Problem Relation Age of Onset  . Lung cancer Maternal Grandmother   . Dementia Mother   . Dementia Father   . Anxiety disorder Sister   . Bipolar disorder Sister   . Drug abuse Sister   . Sexual abuse Maternal  Aunt   . Anxiety disorder Cousin   . Anxiety disorder Sister   . Bipolar disorder Sister   . Drug abuse Sister   . Breast cancer Neg Hx     Past Surgical History:  Procedure Laterality Date  . ABDOMINAL HYSTERECTOMY    . BREAST REDUCTION SURGERY    . CHOLECYSTECTOMY    . COLONOSCOPY W/ BIOPSIES AND POLYPECTOMY    . EMBOLECTOMY N/A    pulmonary embolectomy  . FRACTURE SURGERY     right ankle  . GASTROCNEMIUS RECESSION Left 02/12/2019   Procedure: LEFT GASTROCNEMIUS RECESSION, PLANTAR FASCIA RELEASE;  Surgeon: Newt Minion, MD;  Location: [facility];  Service: Orthopedics;  Laterality: Left;  . IVC FILTER INSERTION    . REDUCTION MAMMAPLASTY Bilateral   . RIGHT HEART CATH N/A 10/28/2016   Procedure: Right Heart Cath;  Surgeon: Jolaine Artist, MD;  Location: [facility];  Service: Cardiovascular;  Laterality: N/A;  . RIGHT HEART CATH N/A 08/26/2017   Procedure: RIGHT HEART CATH;  Surgeon: Jolaine Artist, MD;  Location: [facility];  Service: Cardiovascular;  Laterality: N/A;  . ULTRASOUND GUIDANCE FOR VASCULAR ACCESS  08/26/2017   Procedure: Ultrasound Guidance For Vascular Access;  Surgeon: Jolaine Artist, MD;  Location: [facility];  Service: Cardiovascular;;  . WISDOM TOOTH EXTRACTION     Social History   Occupational History  . Not on file  Tobacco Use  . Smoking status: Never Smoker  . Smokeless tobacco: Never Used  Substance and Sexual Activity  . Alcohol use: No  . Drug use: No  . Sexual activity: Yes    Partners: Male    Birth control/protection: Surgical

## 2019-04-06 ENCOUNTER — Other Ambulatory Visit (HOSPITAL_COMMUNITY): Payer: Self-pay | Admitting: Family Medicine

## 2019-04-09 ENCOUNTER — Other Ambulatory Visit (HOSPITAL_COMMUNITY): Payer: Self-pay | Admitting: *Deleted

## 2019-04-12 ENCOUNTER — Other Ambulatory Visit: Payer: Self-pay

## 2019-04-12 ENCOUNTER — Ambulatory Visit (HOSPITAL_COMMUNITY)
Admission: RE | Admit: 2019-04-12 | Discharge: 2019-04-12 | Disposition: A | Discharge status: Home / Self Care | Payer: Medicare HMO | Source: Ambulatory Visit | Attending: Family | Admitting: Family

## 2019-04-12 DIAGNOSIS — Z452 Encounter for adjustment and management of vascular access device: Secondary | ICD-10-CM | POA: Insufficient documentation

## 2019-04-12 MED ORDER — ALTEPLASE 2 MG IJ SOLR
INTRAMUSCULAR | Status: AC
  Filled 2019-04-12: qty 2

## 2019-04-12 MED ORDER — ALTEPLASE 2 MG IJ SOLR: 2.0000 mg | Freq: Once | INTRAMUSCULAR | Status: DC

## 2019-04-12 MED ORDER — HEPARIN SOD (PORK) LOCK FLUSH 100 UNIT/ML IV SOLN
500.0000 [IU] | Freq: Once | INTRAVENOUS | Status: AC
  Administered 2019-04-12: 500 [IU] via INTRAVENOUS

## 2019-04-12 NOTE — Progress Notes (Signed)
Consult for declotting implanted port: Brisk blood return with port access. Did not instill tPA. Flushed with heparin per protocol.

## 2019-04-20 ENCOUNTER — Encounter: Payer: Self-pay | Admitting: Pulmonary Disease

## 2019-04-20 ENCOUNTER — Ambulatory Visit: Payer: Medicare HMO | Admitting: Pulmonary Disease

## 2019-04-20 ENCOUNTER — Other Ambulatory Visit: Payer: Self-pay

## 2019-04-20 VITALS — BP 124/70 | HR 83 | Temp 98.6°F | Ht 63.0 in | Wt 346.2 lb

## 2019-04-20 DIAGNOSIS — Z9989 Dependence on other enabling machines and devices: Secondary | ICD-10-CM | POA: Diagnosis not present

## 2019-04-20 DIAGNOSIS — I2724 Chronic thromboembolic pulmonary hypertension: Secondary | ICD-10-CM

## 2019-04-20 DIAGNOSIS — G4733 Obstructive sleep apnea (adult) (pediatric): Secondary | ICD-10-CM

## 2019-04-20 NOTE — Patient Instructions (Signed)
Anxiety stable with regard to your breathing Please check with Dr. Haroldine Laws about the dosage for the past We will renew the Xarelto prescription We will review the CPAP download Follow-up in 6

## 2019-04-20 NOTE — Progress Notes (Signed)
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 is a 58 year old with past medical history of morbid obesity, obstructive sleep apnea, hypertension. She was admitted in January 2017 at [place], [place] for saddle embolus. She was initially on heparin anticoagulation and transition to Xarelto. Since discharge has been complaining of dyspnea, cough with white sputum production, episodes of dizziness, right chest pain. She was reevaluated with a CT scan which showed persistent emboli and an echocardiogram which showed severe pulmonary hypertension. S/p thromboendarterectomy at [facility] in 2017. She reports that the anesthetists had a very hard time intubating her. It took a long time to secure the airway. Post procedure she seems to be stable. She was revaluated in ED for persistent chest pain in Sept 2017. She has a CTA done which shows partial recanalization of the thrombus.   She has history of obstructive sleep apnea diagnosed in 2015. She had been maintained on CPAP.  Interim history: Follows with Dr. Haroldine Laws for pulmonary hypertension and is on Riociguat.  Dose was reduced to 1.5 mg 3 times a day in 2019 due to low blood pressure.  She continues on Xarelto anticoagulation  On CPAP for sleep apnea.  Compliant with therapy.  Outpatient Encounter Medications as of 04/20/2019  Medication Sig  . benzonatate (TESSALON) 100 MG capsule Take 1 capsule (100 mg total) by mouth 2 (two) times daily as needed for cough.  . cyclobenzaprine (FLEXERIL) 5 MG tablet TAKE 1 TABLET BY MOUTH TWICE DAILY AS NEEDED FOR MUSCLE SPASM (Patient taking differently: Take 5 mg by mouth 2 (two) times daily as needed for  muscle spasms. )  . diazepam (VALIUM) 5 MG tablet Take 5 mg by mouth daily as needed for anxiety.  . diclofenac sodium (VOLTAREN) 1 % GEL Apply 2 g topically 4 (four) times daily as needed (knee and back pain).  Marland Kitchen docusate sodium (COLACE) 100 MG capsule Take 1 capsule (100 mg total) by mouth every 12 (twelve) hours.  . ferrous sulfate (FERROUSUL) 325 (65 FE) MG tablet Take 1 tablet (325 mg total) by mouth daily with breakfast. (Patient taking differently: Take 325 mg by mouth 2 (two) times a week. )  . fluticasone (FLONASE) 50 MCG/ACT nasal spray Place 2 sprays into both nostrils daily.  . folic acid (FOLVITE) 1 MG tablet Take 1 mg by mouth daily.  . furosemide (LASIX) 40 MG tablet Take 1 tablet (40 mg total) by mouth daily. Take extra 40 mg tablet once in the afternoon AS NEEDED for weight gain 3 lbs or more.  . gabapentin (NEURONTIN) 300 MG capsule Take 2 capsules (600 mg total) by mouth 3 (three) times daily. (Patient taking differently: Take 300 mg by mouth 3 (three) times daily. )  . HYDROcodone-acetaminophen (NORCO/VICODIN) 5-325 MG tablet Take 1 tablet by mouth every 4 (four) hours as needed for moderate pain.  . meclizine (ANTIVERT) 25 MG tablet TAKE 1 TABLET BY MOUTH TWICE DAILY AS NEEDED FOR  DIZZINESS (Patient taking differently: Take 25 mg by mouth 2 (two) times daily as needed for dizziness. TAKE 1 TABLET BY MOUTH TWICE DAILY AS NEEDED FOR  DIZZINESS)  . Melatonin 5 MG TABS Take 5 mg by mouth  at bedtime.  . mirtazapine (REMERON) 45 MG tablet Take 45 mg by mouth every evening.  Marland Kitchen omeprazole (PRILOSEC) 40 MG capsule Take 1 capsule (40 mg total) by mouth 2 (two) times daily.  . polyethylene glycol powder (GLYCOLAX/MIRALAX) powder Take 17 g by mouth 2 (two) times daily as needed.  . Riociguat (ADEMPAS) 1.5 MG TABS Take 1.5 mg by mouth 3 (three) times daily.  . rivaroxaban (XARELTO) 20 MG TABS tablet Take 1 tablet (20 mg total) by mouth daily with supper.  Marland Kitchen spironolactone (ALDACTONE) 25 MG  tablet Take 0.5 tablets (12.5 mg total) by mouth daily. (Patient taking differently: Take 12.5 mg by mouth every other day. )  . traZODone (DESYREL) 100 MG tablet Take 100 mg by mouth at bedtime as needed for sleep.   Marland Kitchen albuterol (PROVENTIL HFA;VENTOLIN HFA) 108 (90 Base) MCG/ACT inhaler Inhale 1-2 puffs into the lungs every 6 (six) hours as needed for wheezing. (Patient not taking: Reported on 04/20/2019)  . atorvastatin (LIPITOR) 20 MG tablet Take 1 tablet (20 mg total) by mouth daily at 6 PM. (Patient not taking: Reported on 04/20/2019)  . zolpidem (AMBIEN) 10 MG tablet Take 10 mg by mouth at bedtime as needed for sleep.   No facility-administered encounter medications on file as of 04/20/2019.    Physical Exam: Blood pressure 104/62, pulse 63, height 5\' 3"  (1.6 m), weight (!) 309 lb 3.2 oz (140.3 kg), SpO2 98 %. Gen:      No acute distress HEENT:  EOMI, sclera anicteric Neck:     No masses; no thyromegaly Lungs:    Clear to auscultation bilaterally; normal respiratory effort CV:         Regular rate and rhythm; no murmurs Abd:      + bowel sounds; soft, non-tender; no palpable masses, no distension Ext:    No edema; adequate peripheral perfusion Skin:      Warm and dry; no rash Neuro: alert and oriented x 3 Psych: normal mood and affect  Data Reviewed: Imaging: CT scan 12/25/15 Right lower lobe and left lower lobe pulmonary embolism. Images reviewed  CT scan 02/08/16 Persistent RLL embolus  CT scan 05/15/16 Persistent but partially recanalized pulmonary embolus within the right lower lobe pulmonary artery.  CTA 09/19/2017- no acute pulmonary embolism.  Evidence for chronic PE in the right lower lobe.  Bibasilar atelectasis  CT chest 09/26/2017-bibasilar atelectasis I reviewed the images personally.  Cardiac: Echo 07/01/2018- of EF 80-99%, grade 1 diastolic dysfunction, RV pressure overload with dilatation, hypokinesis, PA peak pressure 62  RHC 08/2017 RA = 6 RV = 50/7 PA = 48/13  (30) PCW = 6 Fick cardiac output/index = 7.0/2.9 PVR = 3.1 WU Ao sat = 97% PA sat = 67%, 70% SVC sat = 70%  RHC 10/2016  RA = 8 RV = 64/13 PA = 65/19 (35) PCW = 9 Fick cardiac output/index = 7.5/3.1 PVR = 3.5 WU Ao sat = 96% PA sat = 68%,70% SVC sat = 71%   Labs 01/10/16 HIV- Neg LFTs- WNL Jo-1, centromere, dsDNA, RNP, Ro, La, RA, Scl-70- All negative  Assessment:  Chronic thromboembolic PHTN S/p thromboendarterectomy at [facility]. Clinically she is doing better with improved exercise capacity but the intermittent chest pain continues. She continues on anticoagulation and Riociguat  Follow with Dr. Haroldine Laws  Severe OSA Compliant with CPAP.  Review download.  Plan/Recommendations: - Continue Xarelto, Riociugat - CPAP.  [name] MD [facility] 04/20/2019, 11:14 AM  CC: Ruthine Dose,  MD   

## 2019-05-06 ENCOUNTER — Telehealth: Payer: Self-pay | Admitting: Orthopedic Surgery

## 2019-05-06 NOTE — Telephone Encounter (Signed)
Advised [name] [name] I do not see that paperwork for this pt to refax to my attention.

## 2019-05-06 NOTE — Telephone Encounter (Signed)
Cathy from [facility] would like to speak with you in regards to some old verbal orders for this patient.  CB#[phone]

## 2019-05-11 ENCOUNTER — Telehealth (HOSPITAL_COMMUNITY): Payer: Self-pay | Admitting: *Deleted

## 2019-05-11 DIAGNOSIS — I509 Heart failure, unspecified: Secondary | ICD-10-CM

## 2019-05-11 DIAGNOSIS — I272 Pulmonary hypertension, unspecified: Secondary | ICD-10-CM

## 2019-05-11 DIAGNOSIS — F332 Major depressive disorder, recurrent severe without psychotic features: Secondary | ICD-10-CM | POA: Diagnosis not present

## 2019-05-11 NOTE — Telephone Encounter (Signed)
Nurse Practitioner (Danita Assunta Found)  w/ [facility] left VM requesting a call from [place] or his NP about pt.  Routed to [place]   Call back #[phone]

## 2019-05-11 NOTE — Telephone Encounter (Signed)
Patient was seen in the office today with atypical CP that resolved quickly EKG done   [name] Robert, NP would like to discuss medication changes/adjustments with Dr Haroldine Laws  ?Adcirca and adding Coreg 6.25 BID   Advised Dr Haroldine Laws was assigned to clinic 9/2, and we would do pur best to arrange a provider to provider call  Please call [name] [name] at [phone]

## 2019-05-12 ENCOUNTER — Other Ambulatory Visit: Payer: Self-pay | Admitting: Anesthesiology

## 2019-05-12 ENCOUNTER — Other Ambulatory Visit: Payer: Self-pay | Admitting: Internal Medicine

## 2019-05-12 ENCOUNTER — Telehealth: Payer: Self-pay | Admitting: Internal Medicine

## 2019-05-12 ENCOUNTER — Other Ambulatory Visit: Payer: Self-pay | Admitting: Family Medicine

## 2019-05-12 DIAGNOSIS — Z1231 Encounter for screening mammogram for malignant neoplasm of breast: Secondary | ICD-10-CM

## 2019-05-12 NOTE — Telephone Encounter (Signed)
Patient called requesting to schedule a f/u visit with NP.  I have scheduled a Telephone Palliative f/u visit for 05/18/19 @ 9 AM.

## 2019-05-13 NOTE — Addendum Note (Signed)
Addended by: Scarlette Calico on: 05/13/2019 04:57 PM   Modules accepted: Orders

## 2019-05-13 NOTE — Telephone Encounter (Signed)
Dr Haroldine Laws spoke w/Danita Assunta Found, NP about pt, he would like to see her in the office in the next few weeks with an echocardiogram, order placed, message sent to schedulers to arrange

## 2019-05-17 ENCOUNTER — Encounter (HOSPITAL_COMMUNITY): Payer: Medicare HMO

## 2019-05-18 ENCOUNTER — Encounter (HOSPITAL_COMMUNITY): Payer: Medicare HMO

## 2019-05-18 ENCOUNTER — Other Ambulatory Visit: Payer: Self-pay | Admitting: Internal Medicine

## 2019-05-18 ENCOUNTER — Other Ambulatory Visit: Payer: Self-pay

## 2019-05-18 DIAGNOSIS — Z515 Encounter for palliative care: Secondary | ICD-10-CM

## 2019-05-18 NOTE — Progress Notes (Incomplete)
[facility] Palliative Care Consult Note Telephone: [phone]  Fax: [phone]  PATIENT NAME: [name] [name] DOB: [birth year] MRN: [id number]  PRIMARY CARE PROVIDER:   Ruthine Dose, MD  REFERRING PROVIDER:  Ruthine Dose, MD [place],  [place] [address]  RESPONSIBLE PARTY:   [phone])  RECOMMENDATIONS and PLAN:   Palliative Care Encounter  Z51.5  1.  Advance Care Planning:  Readdress goals of care and advanced directives.  Pt. Desires to remain as a full code and full scope of treatment.  Her primary goal is to continue treatment for pulmonary HTN and to begin weight loss.   2.  Shortness of breath:  Chronic but currently exacerbated with intermittent hypoxia. Complicated by sinus infection.  Continue to limit sodium intake, schedule rest periods and use of CPAP.  Document pulse oximetry readings.  Pending appt with Cardiologist.    3.  Depression:  Controlled and continues routine f/u with psychiatrist  Due to the COVID-19 crisis, this visit was performed telephonically from my office and was initiated and consented by patient and or family member.  I spent 25 minutes providing this consultation,  from 0900 to 0925. More than 50% of the time in this consultation was spent coordinating communication with patient.  HISTORY OF PRESENT ILLNESS: F/U with [name] [name]. Palliative Care was asked to help address goals of care.   CODE STATUS: FULL CODE  PPS: 50% HOSPICE ELIGIBILITY/DIAGNOSIS: TBD  PAST MEDICAL HISTORY:  Past Medical History:  Diagnosis Date  . Anxiety   . Arthritis   . CHF exacerbation ([place]) 08/21/2017  . Depression   . Difficult intubation    See [facility] anesthesia records in [place]  . Diverticulitis   . Dyspnea    with exertion  . Family history of adverse reaction to anesthesia    " My mother stopped breathing during procedure"  . GERD (gastroesophageal reflux disease)    PMH;  resolved  . Hyperlipidemia   . Hypertension   . Obesity   . Plantar fasciitis, left   . Pneumonia   . Port-A-Cath in place   . Pre-diabetes   . Pulmonary embolism ([place])   . Pulmonary hypertension ([place])   . Sleep apnea    wears CPAP # 9  . Wears glasses       PERTINENT MEDICATIONS:  Outpatient Encounter Medications as of 05/18/2019  Medication Sig  . albuterol (PROVENTIL HFA;VENTOLIN HFA) 108 (90 Base) MCG/ACT inhaler Inhale 1-2 puffs into the lungs every 6 (six) hours as needed for wheezing. (Patient not taking: Reported on 04/20/2019)  . atorvastatin (LIPITOR) 20 MG tablet Take 1 tablet (20 mg total) by mouth daily at 6 PM. (Patient not taking: Reported on 04/20/2019)  . benzonatate (TESSALON) 100 MG capsule Take 1 capsule (100 mg total) by mouth 2 (two) times daily as needed for cough.  . cyclobenzaprine (FLEXERIL) 5 MG tablet TAKE 1 TABLET BY MOUTH TWICE DAILY AS NEEDED FOR MUSCLE SPASM (Patient taking differently: Take 5 mg by mouth 2 (two) times daily as needed for muscle spasms. )  . diazepam (VALIUM) 5 MG tablet Take 5 mg by mouth daily as needed for anxiety.  . diclofenac sodium (VOLTAREN) 1 % GEL Apply 2 g topically 4 (four) times daily as needed (knee and back pain).  Marland Kitchen docusate sodium (COLACE) 100 MG capsule Take 1 capsule (100 mg total) by mouth every 12 (twelve) hours.  . ferrous sulfate (FERROUSUL) 325 (65 FE) MG tablet Take  1 tablet (325 mg total) by mouth daily with breakfast. (Patient taking differently: Take 325 mg by mouth 2 (two) times a week. )  . fluticasone (FLONASE) 50 MCG/ACT nasal spray Place 2 sprays into both nostrils daily.  . folic acid (FOLVITE) 1 MG tablet Take 1 mg by mouth daily.  . furosemide (LASIX) 40 MG tablet Take 1 tablet (40 mg total) by mouth daily. Take extra 40 mg tablet once in the afternoon AS NEEDED for weight gain 3 lbs or more.  . gabapentin (NEURONTIN) 300 MG capsule Take 2 capsules (600 mg total) by mouth 3 (three) times daily. (Patient  taking differently: Take 300 mg by mouth 3 (three) times daily. )  . HYDROcodone-acetaminophen (NORCO/VICODIN) 5-325 MG tablet Take 1 tablet by mouth every 4 (four) hours as needed for moderate pain.  . meclizine (ANTIVERT) 25 MG tablet TAKE 1 TABLET BY MOUTH TWICE DAILY AS NEEDED FOR  DIZZINESS (Patient taking differently: Take 25 mg by mouth 2 (two) times daily as needed for dizziness. TAKE 1 TABLET BY MOUTH TWICE DAILY AS NEEDED FOR  DIZZINESS)  . Melatonin 5 MG TABS Take 5 mg by mouth at bedtime.  . mirtazapine (REMERON) 45 MG tablet Take 45 mg by mouth every evening.  Marland Kitchen omeprazole (PRILOSEC) 40 MG capsule Take 1 capsule (40 mg total) by mouth 2 (two) times daily.  . polyethylene glycol powder (GLYCOLAX/MIRALAX) powder Take 17 g by mouth 2 (two) times daily as needed.  . Riociguat (ADEMPAS) 1.5 MG TABS Take 1.5 mg by mouth 3 (three) times daily.  . rivaroxaban (XARELTO) 20 MG TABS tablet Take 1 tablet (20 mg total) by mouth daily with supper.  Marland Kitchen spironolactone (ALDACTONE) 25 MG tablet Take 0.5 tablets (12.5 mg total) by mouth daily. (Patient taking differently: Take 12.5 mg by mouth every other day. )  . traZODone (DESYREL) 100 MG tablet Take 100 mg by mouth at bedtime as needed for sleep.   Marland Kitchen zolpidem (AMBIEN) 10 MG tablet Take 10 mg by mouth at bedtime as needed for sleep.   No facility-administered encounter medications on file as of 05/18/2019.     PHYSICAL EXAM:   General: NAD Cardiovascular: regular rate and rhythm Pulmonary: clear ant fields Abdomen: soft, nontender, + bowel sounds GU: no suprapubic tenderness Extremities: no edema, no joint deformities Neurological: A&O x3   Gonzella Lex, NP

## 2019-05-20 ENCOUNTER — Telehealth (HOSPITAL_COMMUNITY): Payer: Self-pay

## 2019-05-20 DIAGNOSIS — H5213 Myopia, bilateral: Secondary | ICD-10-CM | POA: Diagnosis not present

## 2019-05-20 NOTE — Telephone Encounter (Signed)
-----   Message from Scarlette Calico, RN sent at 05/13/2019  4:57 PM EDT ----- Order her an echo for next couple of wks per DB, Arbutus Leas is scheduling

## 2019-05-20 NOTE — Telephone Encounter (Signed)
Order already in.Kamilah will you schedule her echo?

## 2019-05-24 ENCOUNTER — Telehealth: Payer: Self-pay | Admitting: Pulmonary Disease

## 2019-05-24 MED ORDER — RIVAROXABAN 20 MG PO TABS: 20.0000 mg | ORAL_TABLET | Freq: Every day | ORAL | 5 refills | Status: DC

## 2019-05-24 NOTE — Telephone Encounter (Signed)
Spoke with pt and clarified Xarelto dose of 20mg  once daily. Pt asked for a 30 day supply at at a time.  Pt advised refills sent to [facility] on [address].  Pt verbalized understanding.  Nothing further needed at this time.

## 2019-05-25 DIAGNOSIS — R109 Unspecified abdominal pain: Secondary | ICD-10-CM | POA: Diagnosis not present

## 2019-05-25 DIAGNOSIS — Z86010 Personal history of colonic polyps: Secondary | ICD-10-CM | POA: Diagnosis not present

## 2019-05-25 DIAGNOSIS — Z6841 Body Mass Index (BMI) 40.0 and over, adult: Secondary | ICD-10-CM | POA: Diagnosis not present

## 2019-05-25 DIAGNOSIS — Z7901 Long term (current) use of anticoagulants: Secondary | ICD-10-CM | POA: Diagnosis not present

## 2019-05-25 DIAGNOSIS — K219 Gastro-esophageal reflux disease without esophagitis: Secondary | ICD-10-CM | POA: Diagnosis not present

## 2019-05-25 DIAGNOSIS — K582 Mixed irritable bowel syndrome: Secondary | ICD-10-CM | POA: Diagnosis not present

## 2019-05-26 DIAGNOSIS — F332 Major depressive disorder, recurrent severe without psychotic features: Secondary | ICD-10-CM | POA: Diagnosis not present

## 2019-05-28 DIAGNOSIS — F332 Major depressive disorder, recurrent severe without psychotic features: Secondary | ICD-10-CM | POA: Diagnosis not present

## 2019-06-01 NOTE — Progress Notes (Signed)
Pulmonary Hypertension Clinic Note  Date:  06/02/2019   ID:  [name] [name], [name] [name] 20-Oct-1960, MRN [id number]  PCP:  [name] Dose, MD  HF MD: Dr [name] [name]    HPI: [name] [name] is a 58 y.o. 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   Patient was in her [place] until 08/2015 when she started getting symptoms of dyspnea on exertion, dizziness, bilateral LE swelling, and constant R>L non-specific chest pain. These symptoms progressively worsened over several days prompting her to go to [facility] in her then[place]ometown of [place] in 09/2015 where she was diagnosed with saddle PE and started on anticoagulation. She was very briefly on warfarin but quickly switched to Xarelto.   She was referred to [facility] in 12/2015. chocardiogram (12-2015) showed normal LVEF 55-60%, normal LA, normal RV size, RVSP estimated as 70 mm Hg. She further was referred to [facility] and saw Dr. Karena [name] on 03/14/2016 where patient had several studies including V/Q scan which found high prob for PE; studies c/w CTEPH. It was felt that her chronic chest pain was caused by chronic PE. RHC/LHC/Pulm Angiogram 03/2016 confirmed diagnosis of CTEPH. She underwent pulmonary thromboembolectomy 03/31/2016 at [facility] with IVC filter placement. Pre-op cath with no CAD.   Seen by Dr. Karena [name] in 04/2016 for follow up. He felt like her heart size was decreasing. Plan was for lifelong [place] and ECHO and V/Q in 4 months. Dr. Karena [name] has since left the [facility] practice.  CT angio at [facility] on 05/15/16 showed " persistent but partially recanalized PE within right lower lobe pulmonary artery. No new PE identified. "  She was admitted to [facility] in 06/2016 for continued chest pain and parasthesias. V/Q scan on 06/27/16 showed prominent ventilation perfusion  mismatch in right c/w chronic PE. Head CT negative. She was continued on Xarelto and told to follow up with [facility]. Patient doesn't want to have to travel to [facility] for care so [facility] care here in [place].   Admitted to [facility] for suicidal ideation. She was then sent to [facility] for additional treatment. She has ongoing psychiatric follow up.   She presents today for follow up. Struggling lately with depression. SOB is getting worse. Hs problems just walking across the room. + orthopnea or PND. Takes extra lasix as needed. Has lost weight 367 -> 339. Wants to get gastric sleeve. On adempas 1.5 tid. Was on 2tid before and BP dropped too low. She wants to try again.   Echo today (06/02/19) LVEF 65% RV dilated with normal function. Septum flat. 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 PA pressures could not be estimated. 04/2017: EF 55-60%  RV normal RVSP 46mmHG  RHC 08/26/2017 RA = 6 RV = 50/7 PA = 48/13 (30) PCW = 6 Fick cardiac output/index = 7.0/2.9 PVR = 3.1 WU Ao sat = 97% PA sat = 67%, 70% SVC sat = 70% Assessment: Mild PAH (on Adempas 1mg  tid) with high cardiac output. No evidence of intracardiac shunting.   10/2016 RHC RA = 8 RV = 64/13 PA = 65/19 (35) PCW = 9 Fick cardiac output/index = 7.5/3.1 PVR =  3.5 WU Ao sat = 96% PA sat = 68%,70% SVC sat = 71% Assessment: 1. Mild residual PAH in the setting of CTEPH s/p pulmonary thrombolectomy 2. Normal cardiac output 3. Normal left-sided filling pressures  Past Medical History:  Diagnosis Date  . Anxiety   . Arthritis   . CHF exacerbation ([place]) 08/21/2017  . Depression   . Difficult intubation    See [facility] anesthesia records in [place]  . Diverticulitis   . Dyspnea    with exertion  . Family history of adverse  reaction to anesthesia    " My mother stopped breathing during procedure"  . GERD (gastroesophageal reflux disease)    PMH; resolved  . Hyperlipidemia   . Hypertension   . Obesity   . Plantar fasciitis, left   . Pneumonia   . Port-A-Cath in place   . Pre-diabetes   . Pulmonary embolism ([place])   . Pulmonary hypertension ([place])   . Sleep apnea    wears CPAP # 9  . Wears glasses     Past Surgical History:  Procedure Laterality Date  . ABDOMINAL HYSTERECTOMY    . BREAST REDUCTION SURGERY    . CHOLECYSTECTOMY    . COLONOSCOPY W/ BIOPSIES AND POLYPECTOMY    . EMBOLECTOMY N/A    pulmonary embolectomy  . FRACTURE SURGERY     right ankle  . GASTROCNEMIUS RECESSION Left 02/12/2019   Procedure: LEFT GASTROCNEMIUS RECESSION, PLANTAR FASCIA RELEASE;  Surgeon: [name] Minion, MD;  Location: [facility];  Service: Orthopedics;  Laterality: Left;  . IVC FILTER INSERTION    . REDUCTION MAMMAPLASTY Bilateral   . RIGHT HEART CATH N/A 10/28/2016   Procedure: Right Heart Cath;  Surgeon: [name] Artist, MD;  Location: [facility];  Service: Cardiovascular;  Laterality: N/A;  . RIGHT HEART CATH N/A 08/26/2017   Procedure: RIGHT HEART CATH;  Surgeon: [name] Artist, MD;  Location: [facility];  Service: Cardiovascular;  Laterality: N/A;  . ULTRASOUND GUIDANCE FOR VASCULAR ACCESS  08/26/2017   Procedure: Ultrasound Guidance For Vascular Access;  Surgeon: [name] Artist, MD;  Location: [facility];  Service: Cardiovascular;;  . WISDOM TOOTH EXTRACTION      Current Medications:  Current Outpatient Medications on File Prior to Encounter  Medication Sig Dispense Refill  . atorvastatin (LIPITOR) 20 MG tablet Take 1 tablet (20 mg total) by mouth daily at 6 PM. 90 tablet 1  . cyclobenzaprine (FLEXERIL) 5 MG tablet TAKE 1 TABLET BY MOUTH TWICE DAILY AS NEEDED FOR MUSCLE SPASM (Patient taking differently: Take 5 mg by mouth 2 (two) times daily as needed for muscle spasms. ) 40  tablet 2  . diazepam (VALIUM) 5 MG tablet Take 5 mg by mouth daily as needed for anxiety.    . diclofenac sodium (VOLTAREN) 1 % GEL Apply 2 g topically 4 (four) times daily as needed (knee and back pain).    Marland Kitchen docusate sodium (COLACE) 100 MG capsule Take 1 capsule (100 mg total) by mouth every 12 (twelve) hours. 30 capsule 0  . fluticasone (FLONASE) 50 MCG/ACT nasal spray Place 2 sprays into both nostrils daily. 16 g 6  . folic acid (FOLVITE) 1 MG tablet Take 1 mg by mouth daily.    . furosemide (LASIX) 40 MG tablet Take 1 tablet (40 mg total) by mouth daily. Take extra 40 mg tablet once in the afternoon AS NEEDED for weight gain 3 lbs or more. 180 tablet 1  .  gabapentin (NEURONTIN) 300 MG capsule Take 2 capsules (600 mg total) by mouth 3 (three) times daily. (Patient taking differently: Take 300 mg by mouth 3 (three) times daily. ) 540 capsule 1  . meclizine (ANTIVERT) 25 MG tablet TAKE 1 TABLET BY MOUTH TWICE DAILY AS NEEDED FOR  DIZZINESS (Patient taking differently: Take 25 mg by mouth 2 (two) times daily as needed for dizziness. TAKE 1 TABLET BY MOUTH TWICE DAILY AS NEEDED FOR  DIZZINESS) 60 tablet 0  . Melatonin 5 MG TABS Take 5 mg by mouth at bedtime.    . mirtazapine (REMERON) 45 MG tablet Take 45 mg by mouth every evening.    . polyethylene glycol powder (GLYCOLAX/MIRALAX) powder Take 17 g by mouth 2 (two) times daily as needed. 3350 g 1  . rivaroxaban (XARELTO) 20 MG TABS tablet Take 1 tablet (20 mg total) by mouth daily with supper. 30 tablet 5  . spironolactone (ALDACTONE) 25 MG tablet Take 0.5 tablets (12.5 mg total) by mouth daily. (Patient taking differently: Take 12.5 mg by mouth every other day. ) 45 tablet 3  . traZODone (DESYREL) 100 MG tablet Take 100 mg by mouth at bedtime as needed for sleep.     . ferrous sulfate (FERROUSUL) 325 (65 FE) MG tablet Take 1 tablet (325 mg total) by mouth daily with breakfast. (Patient not taking: Reported on 06/02/2019) 90 tablet 3   No current  facility-administered medications on file prior to encounter.     Allergies:   [name].   Social History   Socioeconomic History  . Marital status: Married    Spouse name: Not on file  . Number of children: Not on file  . Years of education: Not on file  . Highest education level: Not on file  Occupational History  . Not on file  Social Needs  . Financial resource strain: Not on file  . Food insecurity    Worry: Not on file    Inability: Not on file  . Transportation needs    Medical: Not on file    Non-medical: Not on file  Tobacco Use  . Smoking status: Never Smoker  . Smokeless tobacco: Never Used  Substance and Sexual Activity  . Alcohol use: No  . Drug use: No  . Sexual activity: Yes    Partners: Male    Birth control/protection: Surgical  Lifestyle  . Physical activity    Days per week: Not on file    Minutes per session: Not on file  . Stress: Not on file  Relationships  . Social Herbalist on phone: Not on file    Gets together: Not on file    Attends religious service: Not on file    Active member of club or organization: Not on file    Attends meetings of clubs or organizations: Not on file    Relationship status: Not on file  Other Topics Concern  . Not on file  Social History Narrative  . Not on file    Family History:  The patient's family history includes Anxiety disorder in her cousin, sister, and sister; Bipolar disorder in her sister and sister; Dementia in her father and mother; Drug abuse in her sister and sister; Lung cancer in her maternal grandmother; Sexual abuse in her maternal aunt.     Review of systems complete and found to be negative unless listed in HPI.   PHYSICAL EXAM:    Vitals:   06/02/19  1453  BP: 124/69  Pulse: 95  SpO2: 98%  Weight: (!) 154 kg (339 lb 9.6 oz)    Wt Readings from Last 3 Encounters:  06/02/19 (!) 154 kg (339 lb 9.6 oz)  04/20/19 (!) 157 kg (346 lb 3.2 oz)  04/12/19  (!) 151.5 kg (334 lb)    General:  Obese  No resp difficulty HEENT: normal Neck: supple. no JVD. Carotids 2+ bilat; no bruits. No lymphadenopathy or thryomegaly appreciated. Cor: PMI nondisplaced. Regular rate & rhythm. No rubs, gallops or murmurs. Lungs: clear Abdomen: soft, nontender, nondistended. No hepatosplenomegaly. No bruits or masses. Good bowel sounds. Extremities: no cyanosis, clubbing, rash, tr-1+ L> R edema Neuro: alert & orientedx3, cranial nerves grossly intact. moves all 4 extremities w/o difficulty. Affect pleasant   Studies/Labs Reviewed:   Recent Labs: 11/02/2018: Magnesium 1.9; TSH 0.638 12/29/2018: ALT 12 02/10/2019: BUN 12; Creatinine, Ser 1.18; Hemoglobin 12.6; Platelets 272; Potassium 4.6; Sodium 142   Lipid Panel    Component Value Date/Time   CHOL 113 11/02/2018 1013   TRIG 55 11/02/2018 1013   HDL 35 (L) 11/02/2018 1013   CHOLHDL 3.2 11/02/2018 1013   VLDL 11 11/02/2018 1013   LDLCALC 67 11/02/2018 1013    Additional studies/ records that were reviewed today include:   ECHO 8/18 EF 55-60%  RV normal RVSP 70mmHG   2D ECHO: 12/15/2015 LV EF: 55% -   60% Study Conclusions - Left ventricle: The cavity size was normal. Wall thickness was normal. Systolic function was normal. The estimated ejection  fraction was in the range of 55% to 60%. - Pulmonary arteries: PA peak pressure: 70 mm Hg (S). - Pericardium, extracardiac: A trivial pericardial effusion was   identified.   2D ECHO 04/05/16 INTERPRETATION:NORMAL LEFT VENTRICULAR FUNCTION WITH MILD LVHSEVERE RV SYSTOLIC DYSFUNCTION (See above) VALVULAR REGURGITATION: TRIVIAL PR, MILD TR NO VALVULAR STENOSIS TRIVIAL PERICARDIAL EFFUSION Compared with prior Echo study on 03/14/2016: RVSP increased from 50 to 69   CATH right heart and coronary angiography: 03/25/2016 [place] Component Name Value Ref Range  Cardiac Index (l/min/m2) 2.3 L/min/m2  Right Atrium Mean Pressure (mmHg)  11 mmHg   Right Ventricle Systolic Pressure (mmHg) 68 mmHg   Pulmonary Artery Mean Pressure (mmHg) 40 mmHg   Pulmonary Wedge Pressure (mmHg) 14 mmHg   Pulmonary Vascular Resistance (Wood units) 4.7    CT angio 05/15/16 IMPRESSION: Persistent but partially recanalized pulmonary embolus within the right lower lobe pulmonary artery. No new pulmonary embolism is identified. Minimal bibasilar atelectatic changes stable from the prior exam.   V/Q scan 06/27/16 IMPRESSION: Prominent ventilation perfusion mismatch noted on the right. Finding suggests high probability right-sided pulmonary embolus in this patient with known right-sided pulmonary embolus.   ASSESSMENT & PLAN:    1. 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 No bleeding.  - Recent echo 8/18 with no evidence RV strain or PAH (RVSP estimated 78mmHG) - RHC 12/18 with very mild PAH and PVR 3.1 WU - Echo 10/19 shows EF 55-60% RV not well seen but looks ok No septal flattening  - Echo today LVEF 65% RV dilated with normal function. Septum flat. Personally reviewed - NYHA III-IIIB in setting of severe obesity - Increase adempas to 2mg  tid. If doesn't tolerate or has progressive symptoms can repeat RHC and consider addition of macitentan. -  Volume status hard to assess but looks ok  - Continue lasix 40 mg daily with an extra 40 mg daily as needed for 3 pound weight gain. Can take extra today.    2. Chronic chest pain:  - Has chronic PE s/p thrombectomy, LHC performed at [facility] 7/17 with no CAD.  - Improved   3. Fibromyalgia/Rheumatoid Arthitis:  - Follows with Rheumatology. No change.    4. Morbid obesity with OHS:  - Body mass index is 60.16 kg/m.  - Encouraged weight loss. Follwoed at [facility] weight lost center.   5. OSA - Encouraged nightly CPAP use  6.  Depression/anxiety - Receiving outpatient therapy.   - Per PCP    Glori Bickers, MD  4:17 PM

## 2019-06-02 ENCOUNTER — Ambulatory Visit (HOSPITAL_BASED_OUTPATIENT_CLINIC_OR_DEPARTMENT_OTHER)
Admission: RE | Admit: 2019-06-02 | Discharge: 2019-06-02 | Disposition: A | Discharge status: Home / Self Care | Payer: Medicare HMO | Source: Ambulatory Visit

## 2019-06-02 ENCOUNTER — Other Ambulatory Visit: Payer: Self-pay

## 2019-06-02 ENCOUNTER — Encounter (HOSPITAL_COMMUNITY): Payer: Self-pay | Admitting: Internal Medicine

## 2019-06-02 ENCOUNTER — Ambulatory Visit (HOSPITAL_COMMUNITY)
Admission: RE | Admit: 2019-06-02 | Discharge: 2019-06-02 | Disposition: A | Discharge status: Home / Self Care | Payer: Medicare HMO | Source: Ambulatory Visit | Attending: Internal Medicine | Admitting: Internal Medicine

## 2019-06-02 VITALS — BP 124/69 | HR 95 | Wt 339.6 lb

## 2019-06-02 DIAGNOSIS — I509 Heart failure, unspecified: Secondary | ICD-10-CM | POA: Diagnosis not present

## 2019-06-02 DIAGNOSIS — Z79899 Other long term (current) drug therapy: Secondary | ICD-10-CM | POA: Diagnosis not present

## 2019-06-02 DIAGNOSIS — M797 Fibromyalgia: Secondary | ICD-10-CM | POA: Diagnosis not present

## 2019-06-02 DIAGNOSIS — I5032 Chronic diastolic (congestive) heart failure: Secondary | ICD-10-CM

## 2019-06-02 DIAGNOSIS — Z86711 Personal history of pulmonary embolism: Secondary | ICD-10-CM | POA: Insufficient documentation

## 2019-06-02 DIAGNOSIS — Z7901 Long term (current) use of anticoagulants: Secondary | ICD-10-CM | POA: Diagnosis not present

## 2019-06-02 DIAGNOSIS — E785 Hyperlipidemia, unspecified: Secondary | ICD-10-CM | POA: Insufficient documentation

## 2019-06-02 DIAGNOSIS — G4733 Obstructive sleep apnea (adult) (pediatric): Secondary | ICD-10-CM | POA: Insufficient documentation

## 2019-06-02 DIAGNOSIS — G894 Chronic pain syndrome: Secondary | ICD-10-CM | POA: Diagnosis not present

## 2019-06-02 DIAGNOSIS — Z9989 Dependence on other enabling machines and devices: Secondary | ICD-10-CM | POA: Diagnosis not present

## 2019-06-02 DIAGNOSIS — M069 Rheumatoid arthritis, unspecified: Secondary | ICD-10-CM | POA: Insufficient documentation

## 2019-06-02 DIAGNOSIS — M1990 Unspecified osteoarthritis, unspecified site: Secondary | ICD-10-CM | POA: Diagnosis not present

## 2019-06-02 DIAGNOSIS — F419 Anxiety disorder, unspecified: Secondary | ICD-10-CM | POA: Diagnosis not present

## 2019-06-02 DIAGNOSIS — R079 Chest pain, unspecified: Secondary | ICD-10-CM | POA: Insufficient documentation

## 2019-06-02 DIAGNOSIS — I272 Pulmonary hypertension, unspecified: Secondary | ICD-10-CM | POA: Diagnosis not present

## 2019-06-02 DIAGNOSIS — I110 Hypertensive heart disease with heart failure: Secondary | ICD-10-CM | POA: Diagnosis not present

## 2019-06-02 DIAGNOSIS — Z6841 Body Mass Index (BMI) 40.0 and over, adult: Secondary | ICD-10-CM | POA: Diagnosis not present

## 2019-06-02 DIAGNOSIS — R45851 Suicidal ideations: Secondary | ICD-10-CM | POA: Diagnosis not present

## 2019-06-02 DIAGNOSIS — F329 Major depressive disorder, single episode, unspecified: Secondary | ICD-10-CM | POA: Diagnosis not present

## 2019-06-02 MED ORDER — ADEMPAS 2 MG PO TABS: 2.0000 mg | ORAL_TABLET | Freq: Three times a day (TID) | ORAL | 11 refills | Status: DC

## 2019-06-02 MED ORDER — ADEMPAS 1.5 MG PO TABS: 2.0000 mg | ORAL_TABLET | Freq: Three times a day (TID) | ORAL | 11 refills | Status: DC

## 2019-06-02 NOTE — Patient Instructions (Signed)
Labs done today. We will contact you only if your labs are abnormal.  INCREASE Adempas 2mg (1 tab) by mouth three times daily  Please continue all other current medications as prescribed.  Your physician recommends that you schedule a follow-up appointment in: 3 months.   At the [facility], you and your health needs are our priority. As part of our continuing mission to provide you with exceptional heart care, we have created designated Provider Care Teams. These Care Teams include your primary Cardiologist (physician) and Advanced Practice Providers (APPs- Physician Assistants and Nurse Practitioners) who all work together to provide you with the care you need, when you need it.   You may see any of the following providers on your designated Care Team at your next follow up: Marland Kitchen Dr Glori Bickers . Dr Loralie Champagne . Darrick Grinder, NP   Please be sure to bring in all your medications bottles to every appointment.

## 2019-06-02 NOTE — Progress Notes (Signed)
  Echocardiogram 2D Echocardiogram has been performed.  [name] [name] 06/02/2019, 2:55 PM

## 2019-06-03 ENCOUNTER — Ambulatory Visit (HOSPITAL_COMMUNITY)
Admission: RE | Admit: 2019-06-03 | Discharge: 2019-06-03 | Disposition: A | Discharge status: Home / Self Care | Payer: Medicare HMO | Source: Ambulatory Visit | Attending: Internal Medicine | Admitting: Internal Medicine

## 2019-06-03 DIAGNOSIS — Z452 Encounter for adjustment and management of vascular access device: Secondary | ICD-10-CM | POA: Diagnosis not present

## 2019-06-03 DIAGNOSIS — I509 Heart failure, unspecified: Secondary | ICD-10-CM | POA: Diagnosis not present

## 2019-06-03 DIAGNOSIS — I110 Hypertensive heart disease with heart failure: Secondary | ICD-10-CM | POA: Diagnosis not present

## 2019-06-03 LAB — BASIC METABOLIC PANEL
Anion gap: 9 (ref 5–15)
BUN: 12 mg/dL (ref 6–20)
CO2: 26 mmol/L (ref 22–32)
Calcium: 9.3 mg/dL (ref 8.9–10.3)
Chloride: 106 mmol/L (ref 98–111)
Creatinine, Ser: 0.87 mg/dL (ref 0.44–1.00)
GFR calc Af Amer: >60 mL/min (ref >60)
GFR calc non Af Amer: >60 mL/min (ref >60)
Glucose, Bld: 97 mg/dL (ref 70–99)
Potassium: 3.6 mmol/L (ref 3.5–5.1)
Sodium: 141 mmol/L (ref 135–145)

## 2019-06-03 LAB — CBC
HCT: 41 % (ref 36.0–46.0)
Hemoglobin: 12.6 g/dL (ref 12.0–15.0)
MCH: 21 pg — ABNORMAL LOW (ref 26.0–34.0)
MCHC: 30.7 g/dL (ref 30.0–36.0)
MCV: 68.2 fL — ABNORMAL LOW (ref 80.0–100.0)
Platelets: 254 10*3/uL (ref 150–400)
RBC: 6.01 MIL/uL — ABNORMAL HIGH (ref 3.87–5.11)
RDW: 19.2 % — ABNORMAL HIGH (ref 11.5–15.5)
WBC: 6.2 10*3/uL (ref 4.0–10.5)
nRBC: 0 % (ref 0.0–0.2)

## 2019-06-03 LAB — BRAIN NATRIURETIC PEPTIDE: B Natriuretic Peptide: 16.5 pg/mL (ref 0.0–100.0)

## 2019-06-03 MED ORDER — SODIUM CHLORIDE 0.9% FLUSH
10.0000 mL | INTRAVENOUS | Status: AC | PRN
  Administered 2019-06-03: 10 mL

## 2019-06-03 MED ORDER — HEPARIN SOD (PORK) LOCK FLUSH 100 UNIT/ML IV SOLN
500.0000 [IU] | INTRAVENOUS | Status: AC | PRN
  Administered 2019-06-03: 500 [IU]

## 2019-06-03 NOTE — Progress Notes (Signed)
PATIENT CARE CENTER NOTE    Provider: Glori Bickers, MD   Procedure: Port-a-cath flush and lab draw   Note: Port-a-cath accessed and flushed using sterile technique. Labs drawn from [facility] per Dr. Clayborne Dana order (CBC, CMP, BNP). Patient tolerated well. Discharge instructions given. Patient alert, oriented and ambulatory at discharge.

## 2019-06-03 NOTE — Discharge Instructions (Signed)
PAC flushed and labs drawn

## 2019-06-04 ENCOUNTER — Telehealth (HOSPITAL_COMMUNITY): Payer: Self-pay

## 2019-06-04 NOTE — Telephone Encounter (Signed)
[facility] left message asking office to confirm patients Adempas was increased to 2mg  three times a day and if patient was aware.  Called them back and made them aware that patient was seen in office and med change was made with her and instructions provided at clinic time.

## 2019-06-21 ENCOUNTER — Encounter (HOSPITAL_COMMUNITY): Payer: Medicare HMO | Admitting: Internal Medicine

## 2019-06-22 ENCOUNTER — Telehealth (HOSPITAL_COMMUNITY): Payer: Self-pay

## 2019-06-22 ENCOUNTER — Encounter (HOSPITAL_COMMUNITY): Payer: Self-pay

## 2019-06-22 NOTE — Telephone Encounter (Signed)
Drop adempas back to 1.5   Double lasix for 3 days. Arrange RHC in 1-2 weeks with me.

## 2019-06-22 NOTE — Telephone Encounter (Signed)
Pt advised and verbalized understanding.  Patient also wanted to remind you that she experienced a lot of pain during her last echo.   Patient is currently taking Xarelto, does she need to hold this, if so for how long?

## 2019-06-22 NOTE — Telephone Encounter (Signed)
Patient called to report that since increasing the Adempas she has had an increase in sob and swelling,stating that the left side is worse. Please advise.

## 2019-06-24 NOTE — Telephone Encounter (Signed)
Thank you :)

## 2019-06-29 ENCOUNTER — Ambulatory Visit
Admission: RE | Admit: 2019-06-29 | Discharge: 2019-06-29 | Disposition: A | Discharge status: Home / Self Care | Payer: Medicare HMO | Source: Ambulatory Visit | Attending: Family Medicine | Admitting: Family Medicine

## 2019-06-29 ENCOUNTER — Other Ambulatory Visit: Payer: Self-pay

## 2019-06-29 DIAGNOSIS — Z1231 Encounter for screening mammogram for malignant neoplasm of breast: Secondary | ICD-10-CM

## 2019-07-01 ENCOUNTER — Encounter (HOSPITAL_COMMUNITY): Payer: Medicare HMO

## 2019-07-02 ENCOUNTER — Other Ambulatory Visit (HOSPITAL_COMMUNITY)
Admission: RE | Admit: 2019-07-02 | Discharge: 2019-07-02 | Disposition: A | Discharge status: Home / Self Care | Payer: Medicare HMO | Source: Ambulatory Visit | Attending: Internal Medicine | Admitting: Internal Medicine

## 2019-07-02 ENCOUNTER — Encounter (HOSPITAL_COMMUNITY): Payer: Medicare HMO

## 2019-07-02 DIAGNOSIS — Z20828 Contact with and (suspected) exposure to other viral communicable diseases: Secondary | ICD-10-CM | POA: Diagnosis not present

## 2019-07-02 DIAGNOSIS — Z01812 Encounter for preprocedural laboratory examination: Secondary | ICD-10-CM | POA: Insufficient documentation

## 2019-07-03 LAB — NOVEL CORONAVIRUS, NAA (HOSP ORDER, SEND-OUT TO REF LAB; TAT 18-24 HRS): SARS-CoV-2, NAA: NOT DETECTED

## 2019-07-05 ENCOUNTER — Other Ambulatory Visit: Payer: Self-pay

## 2019-07-05 ENCOUNTER — Encounter (HOSPITAL_COMMUNITY)
Admission: RE | Disposition: A | Discharge status: Home / Self Care | Payer: Self-pay | Source: Home / Self Care | Attending: Internal Medicine

## 2019-07-05 ENCOUNTER — Ambulatory Visit (HOSPITAL_COMMUNITY)
Admission: RE | Admit: 2019-07-05 | Discharge: 2019-07-05 | Disposition: A | Discharge status: Home / Self Care | Payer: Medicare HMO | Attending: Internal Medicine | Admitting: Internal Medicine

## 2019-07-05 DIAGNOSIS — M797 Fibromyalgia: Secondary | ICD-10-CM | POA: Insufficient documentation

## 2019-07-05 DIAGNOSIS — I110 Hypertensive heart disease with heart failure: Secondary | ICD-10-CM | POA: Diagnosis not present

## 2019-07-05 DIAGNOSIS — Z7901 Long term (current) use of anticoagulants: Secondary | ICD-10-CM | POA: Diagnosis not present

## 2019-07-05 DIAGNOSIS — Z79899 Other long term (current) drug therapy: Secondary | ICD-10-CM | POA: Insufficient documentation

## 2019-07-05 DIAGNOSIS — I509 Heart failure, unspecified: Secondary | ICD-10-CM | POA: Diagnosis not present

## 2019-07-05 DIAGNOSIS — F419 Anxiety disorder, unspecified: Secondary | ICD-10-CM | POA: Diagnosis not present

## 2019-07-05 DIAGNOSIS — E785 Hyperlipidemia, unspecified: Secondary | ICD-10-CM | POA: Insufficient documentation

## 2019-07-05 DIAGNOSIS — Z6841 Body Mass Index (BMI) 40.0 and over, adult: Secondary | ICD-10-CM | POA: Insufficient documentation

## 2019-07-05 DIAGNOSIS — G894 Chronic pain syndrome: Secondary | ICD-10-CM | POA: Insufficient documentation

## 2019-07-05 DIAGNOSIS — G4733 Obstructive sleep apnea (adult) (pediatric): Secondary | ICD-10-CM | POA: Insufficient documentation

## 2019-07-05 DIAGNOSIS — F329 Major depressive disorder, single episode, unspecified: Secondary | ICD-10-CM | POA: Insufficient documentation

## 2019-07-05 DIAGNOSIS — I2721 Secondary pulmonary arterial hypertension: Secondary | ICD-10-CM | POA: Insufficient documentation

## 2019-07-05 DIAGNOSIS — R079 Chest pain, unspecified: Secondary | ICD-10-CM | POA: Diagnosis not present

## 2019-07-05 HISTORY — PX: RIGHT HEART CATH: CATH118263

## 2019-07-05 LAB — POCT I-STAT EG7
Acid-base deficit: 2 mmol/L (ref 0.0–2.0)
Bicarbonate: 24 mmol/L (ref 20.0–28.0)
Bicarbonate: 26.3 mmol/L (ref 20.0–28.0)
Calcium, Ion: 1.12 mmol/L — ABNORMAL LOW (ref 1.15–1.40)
Calcium, Ion: 1.31 mmol/L (ref 1.15–1.40)
HCT: 37 % (ref 36.0–46.0)
HCT: 40 % (ref 36.0–46.0)
Hemoglobin: 12.6 g/dL (ref 12.0–15.0)
Hemoglobin: 13.6 g/dL (ref 12.0–15.0)
O2 Saturation: 72 %
O2 Saturation: 77 %
Potassium: 3.4 mmol/L — ABNORMAL LOW (ref 3.5–5.1)
Potassium: 3.8 mmol/L (ref 3.5–5.1)
Sodium: 146 mmol/L — ABNORMAL HIGH (ref 135–145)
Sodium: 148 mmol/L — ABNORMAL HIGH (ref 135–145)
TCO2: 25 mmol/L (ref 22–32)
TCO2: 28 mmol/L (ref 22–32)
pCO2, Ven: 44.3 mmHg (ref 44.0–60.0)
pCO2, Ven: 47.3 mmHg (ref 44.0–60.0)
pH, Ven: 7.342 (ref 7.250–7.430)
pH, Ven: 7.353 (ref 7.250–7.430)
pO2, Ven: 40 mmHg (ref 32.0–45.0)
pO2, Ven: 44 mmHg (ref 32.0–45.0)

## 2019-07-05 LAB — BASIC METABOLIC PANEL
Anion gap: 8 (ref 5–15)
BUN: 13 mg/dL (ref 6–20)
CO2: 25 mmol/L (ref 22–32)
Calcium: 9.2 mg/dL (ref 8.9–10.3)
Chloride: 108 mmol/L (ref 98–111)
Creatinine, Ser: 1.05 mg/dL — ABNORMAL HIGH (ref 0.44–1.00)
GFR calc Af Amer: >60 mL/min (ref >60)
GFR calc non Af Amer: 59 mL/min — ABNORMAL LOW (ref >60)
Glucose, Bld: 103 mg/dL — ABNORMAL HIGH (ref 70–99)
Potassium: 3.8 mmol/L (ref 3.5–5.1)
Sodium: 141 mmol/L (ref 135–145)

## 2019-07-05 LAB — CBC
HCT: 38.7 % (ref 36.0–46.0)
Hemoglobin: 12.1 g/dL (ref 12.0–15.0)
MCH: 21.3 pg — ABNORMAL LOW (ref 26.0–34.0)
MCHC: 31.3 g/dL (ref 30.0–36.0)
MCV: 68.1 fL — ABNORMAL LOW (ref 80.0–100.0)
Platelets: 255 10*3/uL (ref 150–400)
RBC: 5.68 MIL/uL — ABNORMAL HIGH (ref 3.87–5.11)
RDW: 19.3 % — ABNORMAL HIGH (ref 11.5–15.5)
WBC: 5.8 10*3/uL (ref 4.0–10.5)
nRBC: 0 % (ref 0.0–0.2)

## 2019-07-05 SURGERY — RIGHT HEART CATH
Anesthesia: LOCAL

## 2019-07-05 MED ORDER — SODIUM CHLORIDE 0.9% FLUSH: 3.0000 mL | Freq: Two times a day (BID) | INTRAVENOUS | Status: DC

## 2019-07-05 MED ORDER — MIDAZOLAM HCL 2 MG/2ML IJ SOLN
INTRAMUSCULAR | Status: AC
  Filled 2019-07-05: qty 2

## 2019-07-05 MED ORDER — SODIUM CHLORIDE 0.9 % IV SOLN: 250.0000 mL | INTRAVENOUS | Status: DC | PRN

## 2019-07-05 MED ORDER — LIDOCAINE HCL (PF) 1 % IJ SOLN
INTRAMUSCULAR | Status: DC | PRN
  Administered 2019-07-05: 2 mL

## 2019-07-05 MED ORDER — ASPIRIN 81 MG PO CHEW
81.0000 mg | CHEWABLE_TABLET | ORAL | Status: AC
  Administered 2019-07-05: 81 mg via ORAL
  Filled 2019-07-05: qty 1

## 2019-07-05 MED ORDER — HEPARIN (PORCINE) IN NACL 1000-0.9 UT/500ML-% IV SOLN
INTRAVENOUS | Status: DC | PRN
  Administered 2019-07-05: 500 mL

## 2019-07-05 MED ORDER — LIDOCAINE HCL (PF) 1 % IJ SOLN
INTRAMUSCULAR | Status: AC
  Filled 2019-07-05: qty 30

## 2019-07-05 MED ORDER — HYDRALAZINE HCL 20 MG/ML IJ SOLN: 10.0000 mg | INTRAMUSCULAR | Status: DC | PRN

## 2019-07-05 MED ORDER — SODIUM CHLORIDE 0.9% FLUSH: 3.0000 mL | INTRAVENOUS | Status: DC | PRN

## 2019-07-05 MED ORDER — HEPARIN (PORCINE) IN NACL 1000-0.9 UT/500ML-% IV SOLN
INTRAVENOUS | Status: AC
  Filled 2019-07-05: qty 500

## 2019-07-05 MED ORDER — ONDANSETRON HCL 4 MG/2ML IJ SOLN: 4.0000 mg | Freq: Four times a day (QID) | INTRAMUSCULAR | Status: DC | PRN

## 2019-07-05 MED ORDER — FENTANYL CITRATE (PF) 100 MCG/2ML IJ SOLN
INTRAMUSCULAR | Status: AC
  Filled 2019-07-05: qty 2

## 2019-07-05 MED ORDER — LABETALOL HCL 5 MG/ML IV SOLN: 10.0000 mg | INTRAVENOUS | Status: DC | PRN

## 2019-07-05 MED ORDER — MIDAZOLAM HCL 2 MG/2ML IJ SOLN
INTRAMUSCULAR | Status: DC | PRN
  Administered 2019-07-05: 1 mg via INTRAVENOUS

## 2019-07-05 MED ORDER — ACETAMINOPHEN 325 MG PO TABS: 650.0000 mg | ORAL_TABLET | ORAL | Status: DC | PRN

## 2019-07-05 MED ORDER — HEPARIN SOD (PORK) LOCK FLUSH 100 UNIT/ML IV SOLN
500.0000 [IU] | INTRAVENOUS | Status: AC | PRN
  Administered 2019-07-05: 500 [IU]

## 2019-07-05 MED ORDER — FENTANYL CITRATE (PF) 100 MCG/2ML IJ SOLN
INTRAMUSCULAR | Status: DC | PRN
  Administered 2019-07-05: 25 ug via INTRAVENOUS

## 2019-07-05 MED ORDER — SODIUM CHLORIDE 0.9 % IV SOLN: INTRAVENOUS | Status: DC

## 2019-07-05 SURGICAL SUPPLY — 6 items
CATH BALLN WEDGE 5F 110CM (CATHETERS) ×2 IMPLANT
HOVERMATT SINGLE USE (MISCELLANEOUS) ×2 IMPLANT
PACK CARDIAC CATHETERIZATION (CUSTOM PROCEDURE TRAY) ×2 IMPLANT
SHEATH GLIDE SLENDER 4/5FR (SHEATH) ×2 IMPLANT
TRANSDUCER W/STOPCOCK (MISCELLANEOUS) ×2 IMPLANT
TUBING ART PRESS 72  MALE/MALE (TUBING) ×2 IMPLANT

## 2019-07-05 NOTE — H&P (Signed)
Pulmonary Hypertension Clinic Note  Date:  07/05/2019   ID:  Danely, Krausz Jun 03, 1961, MRN [id number]  PCP:  Ruthine Dose, MD  HF MD: Dr Haroldine Laws    HPI: [name] [name] is a 58 y.o. 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   In 12/2015. Echocardiogram (12-2015) showed normal LVEF 55-60%, normal LA, normal RV size, RVSP estimated as 70 mm Hg. She further was referred to [facility] and saw Dr. Karena Addison on 03/14/2016 where patient had several studies including V/Q scan which found high prob for PE; studies c/w CTEPH. Pulm Angiogram 03/2016 confirmed diagnosis of CTEPH. She underwent pulmonary thromboembolectomy 03/31/2016 at [facility] with IVC filter placement. Pre-op cath with no CAD.   She was admitted to [facility] in 06/2016 for continued chest pain and parasthesias. V/Q scan on 06/27/16 showed prominent ventilation perfusion mismatch in right c/w chronic PE. Head CT negative. She was continued on Xarelto and told to follow up with [facility]. Patient doesn't want to have to travel to [facility] for care so [facility] care here in [place].   Admitted to [facility] for suicidal ideation. She was then sent to [facility] for additional treatment. She has ongoing psychiatric follow up.   Continues to have severe DOE. On adempas 1.5 tid. Was on 2tid before and BP dropped too low. SOB with minimal exertion. + edema. Struggling with weight loss.    Echo  (06/02/19) LVEF 65% RV dilated with normal function. Septum flat. 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 PA pressures could not be estimated. 04/2017: EF 55-60%  RV normal RVSP 91mmHG   RHC 08/26/2017 RA = 6 RV = 50/7 PA = 48/13 (30) PCW = 6 Fick cardiac output/index = 7.0/2.9 PVR = 3.1 WU Ao sat = 97% PA sat = 67%, 70% SVC sat = 70% Assessment: Mild PAH (on Adempas 1mg  tid) with high cardiac output. No evidence of intracardiac shunting.   10/2016 RHC RA = 8 RV = 64/13 PA = 65/19 (35) PCW = 9 Fick cardiac output/index = 7.5/3.1 PVR = 3.5 WU Ao sat = 96% PA sat = 68%,70% SVC sat = 71% Assessment: 1. Mild residual PAH in the setting of CTEPH s/p pulmonary thrombolectomy 2. Normal cardiac output 3. Normal left-sided filling pressures  Past Medical History:  Diagnosis Date  . Anxiety   . Arthritis   . CHF exacerbation ([place]) 08/21/2017  . Depression   . Difficult intubation    See [facility] anesthesia records in [place]  . Diverticulitis   . Dyspnea    with exertion  . Family history of adverse reaction to anesthesia    " My mother stopped breathing during procedure"  . GERD (gastroesophageal reflux disease)    PMH; resolved  . Hyperlipidemia   . Hypertension   . Obesity   . Plantar fasciitis, left   . Pneumonia   . Port-A-Cath in place   . Pre-diabetes   . Pulmonary embolism ([place])   . Pulmonary hypertension ([place])   . Sleep apnea    wears CPAP # 9  . Wears glasses     Past Surgical History:  Procedure Laterality Date  . ABDOMINAL HYSTERECTOMY    . BREAST REDUCTION SURGERY    . CHOLECYSTECTOMY    . COLONOSCOPY W/ BIOPSIES AND POLYPECTOMY    . EMBOLECTOMY N/A    pulmonary embolectomy  . FRACTURE SURGERY     right ankle  . GASTROCNEMIUS RECESSION Left 02/12/2019   Procedure: LEFT GASTROCNEMIUS RECESSION, PLANTAR FASCIA RELEASE;  Surgeon: Newt Minion, MD;  Location: [facility];  Service: Orthopedics;  Laterality: Left;  . IVC FILTER INSERTION    . REDUCTION MAMMAPLASTY Bilateral   . RIGHT HEART CATH N/A 10/28/2016   Procedure: Right Heart Cath;  Surgeon: Jolaine Artist, MD;  Location: [facility];  Service:  Cardiovascular;  Laterality: N/A;  . RIGHT HEART CATH N/A 08/26/2017   Procedure: RIGHT HEART CATH;  Surgeon: Jolaine Artist, MD;  Location: [facility];  Service: Cardiovascular;  Laterality: N/A;  . ULTRASOUND GUIDANCE FOR VASCULAR ACCESS  08/26/2017   Procedure: Ultrasound Guidance For Vascular Access;  Surgeon: Jolaine Artist, MD;  Location: [facility];  Service: Cardiovascular;;  . WISDOM TOOTH EXTRACTION      Current Medications:  No current facility-administered medications on file prior to encounter.    Current Outpatient Medications on File Prior to Encounter  Medication Sig Dispense Refill  . acetaminophen-codeine (TYLENOL #4) 300-60 MG tablet Take 1 tablet by mouth every 4 (four) hours as needed for pain.    Marland Kitchen atorvastatin (LIPITOR) 20 MG tablet Take 1 tablet (20 mg total) by mouth daily at 6 PM. 90 tablet 1  . cyclobenzaprine (FLEXERIL) 5 MG tablet TAKE 1 TABLET BY MOUTH TWICE DAILY AS NEEDED FOR MUSCLE SPASM (Patient taking differently: Take 5 mg by mouth 2 (two) times daily as needed for muscle spasms. ) 40 tablet 2  . diclofenac sodium (VOLTAREN) 1 % GEL Apply 2 g topically 4 (four) times daily as needed (knee and back pain).    Marland Kitchen docusate sodium (COLACE) 100 MG capsule Take 1 capsule (100 mg total) by mouth every 12 (twelve) hours. 30 capsule 0  . furosemide (LASIX) 40 MG tablet Take 1 tablet (40 mg total) by mouth daily. Take extra 40 mg tablet once in the afternoon AS NEEDED for weight gain 3 lbs or more. 180 tablet 1  . gabapentin (NEURONTIN) 300 MG capsule Take 2 capsules (600 mg total) by mouth 3 (three) times daily. (Patient taking differently: Take 300 mg by mouth 3 (three) times daily. ) 540 capsule 1  . LORazepam (ATIVAN) 0.5 MG tablet Take 0.5 mg by mouth every 8 (eight) hours as needed for anxiety or sleep.    . meclizine (ANTIVERT) 25 MG tablet TAKE 1 TABLET BY MOUTH TWICE DAILY AS NEEDED FOR  DIZZINESS (Patient taking differently: Take 25 mg  by mouth 2 (two) times daily as needed for dizziness. TAKE 1 TABLET BY MOUTH TWICE DAILY AS NEEDED FOR  DIZZINESS) 60 tablet 0  . Melatonin 5 MG TABS Take 5 mg by mouth at bedtime.    . mirtazapine (REMERON) 15 MG tablet Take 15 mg by mouth at bedtime. Total dosage is 60 mg    . mirtazapine (REMERON) 45 MG tablet Take 45 mg by mouth at bedtime. Total dosage is 60 mg    . polyethylene glycol powder (GLYCOLAX/MIRALAX) powder Take 17 g by mouth 2 (two) times daily as needed. 3350 g 1  . Riociguat (ADEMPAS) 2 MG TABS Take 2 mg by mouth 3 (three) times daily. (Patient  taking differently: Take 1 mg by mouth 3 (three) times daily. ) 90 tablet 11  . rivaroxaban (XARELTO) 20 MG TABS tablet Take 1 tablet (20 mg total) by mouth daily with supper. 30 tablet 5  . spironolactone (ALDACTONE) 25 MG tablet Take 0.5 tablets (12.5 mg total) by mouth daily. (Patient taking differently: Take 12.5 mg by mouth every other day. ) 45 tablet 3  . traZODone (DESYREL) 100 MG tablet Take 150 mg by mouth at bedtime as needed for sleep.     . fluticasone (FLONASE) 50 MCG/ACT nasal spray Place 2 sprays into both nostrils daily. 16 g 6    Allergies:   [name].   Social History   Socioeconomic History  . Marital status: Married    Spouse name: Not on file  . Number of children: Not on file  . Years of education: Not on file  . Highest education level: Not on file  Occupational History  . Not on file  Social Needs  . Financial resource strain: Not on file  . Food insecurity    Worry: Not on file    Inability: Not on file  . Transportation needs    Medical: Not on file    Non-medical: Not on file  Tobacco Use  . Smoking status: Never Smoker  . Smokeless tobacco: Never Used  Substance and Sexual Activity  . Alcohol use: No  . Drug use: No  . Sexual activity: Yes    Partners: Male    Birth control/protection: Surgical  Lifestyle  . Physical activity    Days per week: Not on file     Minutes per session: Not on file  . Stress: Not on file  Relationships  . Social Herbalist on phone: Not on file    Gets together: Not on file    Attends religious service: Not on file    Active member of club or organization: Not on file    Attends meetings of clubs or organizations: Not on file    Relationship status: Not on file  Other Topics Concern  . Not on file  Social History Narrative  . Not on file    Family History:  The patient's family history includes Anxiety disorder in her cousin, sister, and sister; Bipolar disorder in her sister and sister; Dementia in her father and mother; Drug abuse in her sister and sister; Lung cancer in her maternal grandmother; Sexual abuse in her maternal aunt.     Review of systems complete and found to be negative unless listed in HPI.   PHYSICAL EXAM:    Vitals:   07/05/19 0927  BP: 125/76  Pulse: 63  Resp: 16  Temp: 97.9 F (36.6 C)  TempSrc: Skin  SpO2: 98%  Weight: (!) 152.4 kg  Height: 5\' 3"  (1.6 m)    Wt Readings from Last 3 Encounters:  07/05/19 (!) 152.4 kg  06/02/19 (!) 154 kg  04/20/19 (!) 157 kg    General:  Obese woman No resp difficulty HEENT: normal Neck: supple. JVP hard to see . Carotids 2+ bilat; no bruits. No lymphadenopathy or thryomegaly appreciated. Cor: PMI nondisplaced. Regular rate & rhythm. 2/6 TR. Lungs: clear Abdomen: obese soft, nontender, nondistended. No hepatosplenomegaly. No bruits or masses. Good bowel sounds. Extremities: no cyanosis, clubbing, rash, trace edema Neuro: alert & orientedx3, cranial nerves grossly intact. moves all 4 extremities w/o difficulty. Affect pleasant   Studies/Labs Reviewed:   Recent Labs: 11/02/2018: Magnesium  1.9; TSH 0.638 12/29/2018: ALT 12 06/03/2019: B Natriuretic Peptide 16.5 07/05/2019: BUN 13; Creatinine, Ser 1.05; Hemoglobin 12.1; Platelets 255; Potassium 3.8; Sodium 141   Lipid Panel    Component Value Date/Time   CHOL 113 11/02/2018  1013   TRIG 55 11/02/2018 1013   HDL 35 (L) 11/02/2018 1013   CHOLHDL 3.2 11/02/2018 1013   VLDL 11 11/02/2018 1013   LDLCALC 67 11/02/2018 1013    Additional studies/ records that were reviewed today include:   ECHO 8/18 EF 55-60%  RV normal RVSP 20mmHG   2D ECHO: 12/15/2015 LV EF: 55% -   60% Study Conclusions - Left ventricle: The cavity size was normal. Wall thickness was normal. Systolic function was normal. The estimated ejection  fraction was in the range of 55% to 60%. - Pulmonary arteries: PA peak pressure: 70 mm Hg (S). - Pericardium, extracardiac: A trivial pericardial effusion was   identified.   2D ECHO 04/05/16 INTERPRETATION:NORMAL LEFT VENTRICULAR FUNCTION WITH MILD LVHSEVERE RV SYSTOLIC DYSFUNCTION (See above) VALVULAR REGURGITATION: TRIVIAL PR, MILD TR NO VALVULAR STENOSIS TRIVIAL PERICARDIAL EFFUSION Compared with prior Echo study on 03/14/2016: RVSP increased from 50 to 69   CATH right heart and coronary angiography: 03/25/2016 [place] Component Name Value Ref Range  Cardiac Index (l/min/m2) 2.3 L/min/m2  Right Atrium Mean Pressure (mmHg) 11 mmHg   Right Ventricle Systolic Pressure (mmHg) 68 mmHg   Pulmonary Artery Mean Pressure (mmHg) 40 mmHg   Pulmonary Wedge Pressure (mmHg) 14 mmHg   Pulmonary Vascular Resistance ([place] units) 4.7    CT angio 05/15/16 IMPRESSION: Persistent but partially recanalized pulmonary embolus within the right lower lobe pulmonary artery. No new pulmonary embolism is identified. Minimal bibasilar atelectatic changes stable from the prior exam.   V/Q scan 06/27/16 IMPRESSION: Prominent ventilation perfusion mismatch noted on the right. Finding suggests high probability right-sided pulmonary embolus in this patient with known right-sided pulmonary embolus.   ASSESSMENT & PLAN:    1. 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 No bleeding.  - Echo 8/18 with no evidence RV strain or PAH (RVSP estimated 11mmHG) - RHC 12/18 with very mild PAH and PVR 3.1 WU - Echo 10/19 shows EF 55-60% RV not well seen but looks ok No septal flattening  - Echo 9/29 LVEF 65% RV dilated with normal function. Septum flat. Personally reviewed - Progressive DOE with NYHA IIIB in setting of severe obesity - Continue adempas 1.5 tid. Did not tolerate higher dosing  - For RHC today to reassess and consider adding ERA  2. Chronic chest pain:  - Has chronic PE s/p thrombectomy, LHC performed at [facility] 7/17 with no CAD.  - Likely due to [place]   3. Fibromyalgia/Rheumatoid Arthitis:  - Follows with Rheumatology. No change.    4. Morbid obesity with OHS:  - Body mass index is 59.52 kg/m.  - Encouraged weight loss. Follwoed at [facility] weight lost center.   5. OSA - Encouraged nightly CPAP use  6. Depression/anxiety - Receiving outpatient therapy.   - Per PCP    Glori Bickers, MD  10:22 AM

## 2019-07-05 NOTE — Progress Notes (Signed)
Patient refusing to allow Short Stay nurse to look and obtain Peripheral IV, draw labs. Anderson Malta, Cath Lab Coordinator made aware and came by to see patient. Stated that Peripheral IV was needed to perform procedure. Patient agreed to have IV Team try for IV, but if not obtained, states she will leave and not have procedure done today. Dr. Haroldine Laws made aware. Waiting for IV Team at this time.

## 2019-07-05 NOTE — Interval H&P Note (Signed)
History and Physical Interval Note:  07/05/2019 10:26 AM  [name] [name]  has presented today for surgery, with the diagnosis of heart failure.  The various methods of treatment have been discussed with the patient and family. After consideration of risks, benefits and other options for treatment, the patient has consented to  Procedure(s): RIGHT HEART CATH (N/A) as a surgical intervention.  The patient's history has been reviewed, patient examined, no change in status, stable for surgery.  I have reviewed the patient's chart and labs.  Questions were answered to the patient's satisfaction.     Daniel Bensimhon

## 2019-07-05 NOTE — Progress Notes (Signed)
Discharge instructions reviewed with patient and husband. Verbalized understanding

## 2019-07-05 NOTE — Discharge Instructions (Signed)
Brachial Site Care ° °This sheet gives you information about how to care for yourself after your procedure. Your health care provider may also give you more specific instructions. If you have problems or questions, contact your health care provider. °What can I expect after the procedure? °After the procedure, it is common to have: °· Bruising and tenderness at the catheter insertion area. °Follow these instructions at home: °Medicines °· Take over-the-counter and prescription medicines only as told by your health care provider. °Insertion site care °· Follow instructions from your health care provider about how to take care of your insertion site. Make sure you: °? Wash your hands with soap and water before you change your bandage (dressing). If soap and water are not available, use hand sanitizer. °? Change your dressing as told by your health care provider. °? Leave stitches (sutures), skin glue, or adhesive strips in place. These skin closures may need to stay in place for 2 weeks or longer. If adhesive strip edges start to loosen and curl up, you may trim the loose edges. Do not remove adhesive strips completely unless your health care provider tells you to do that. °· Check your insertion site every day for signs of infection. Check for: °? Redness, swelling, or pain. °? Fluid or blood. °? Pus or a bad smell. °? Warmth. °· Do not take baths, swim, or use a hot tub until your health care provider approves. °· You may shower 24-48 hours after the procedure, or as directed by your health care provider. °? Remove the dressing and gently wash the site with plain soap and water. °? Pat the area dry with a clean towel. °? Do not rub the site. That could cause bleeding. °· Do not apply powder or lotion to the site. °Activity ° °· For 24 hours after the procedure, or as directed by your health care provider: °? Do not flex or bend the affected arm. °? Do not push or pull heavy objects with the affected arm. °? Do not  drive yourself home from the hospital or clinic. You may drive 24 hours after the procedure unless your health care provider tells you not to. °? Do not operate machinery or power tools. °· Do not lift anything that is heavier than 10 lb (4.5 kg), or the limit that you are told, until your health care provider says that it is safe. °· Ask your health care provider when it is okay to: °? Return to work or school. °? Resume usual physical activities or sports. °? Resume sexual activity. °General instructions °· If the catheter site starts to bleed, raise your arm and put firm pressure on the site. If the bleeding does not stop, get help right away. This is a medical emergency. °· If you went home on the same day as your procedure, a responsible adult should be with you for the first 24 hours after you arrive home. °· Keep all follow-up visits as told by your health care provider. This is important. °Contact a health care provider if: °· You have a fever. °· You have redness, swelling, or yellow drainage around your insertion site. °Get help right away if: °· You have unusual pain at the radial site. °· The catheter insertion area swells very fast. °· The insertion area is bleeding, and the bleeding does not stop when you hold steady pressure on the area. °· Your arm or hand becomes pale, cool, tingly, or numb. °These symptoms may represent a serious problem   that is an emergency. Do not wait to see if the symptoms will go away. Get medical help right away. Call your local emergency services (911 in the [place] Do not drive yourself to the hospital. °Summary °· After the procedure, it is common to have bruising and tenderness at the site. °· Follow instructions from your health care provider about how to take care of your radial site wound. Check the wound every day for signs of infection. °· Do not lift anything that is heavier than 10 lb (4.5 kg), or the limit that you are told, until your health care provider says  that it is safe. °This information is not intended to replace advice given to you by your health care provider. Make sure you discuss any questions you have with your health care provider. °Document Released: 09/28/2010 Document Revised: 10/01/2017 Document Reviewed: 10/01/2017 °Elsevier Patient Education © 2020 [employer] Inc[employer] ° °

## 2019-07-06 ENCOUNTER — Encounter (HOSPITAL_COMMUNITY): Payer: Self-pay | Admitting: Internal Medicine

## 2019-07-13 ENCOUNTER — Telehealth (HOSPITAL_COMMUNITY): Payer: Self-pay | Admitting: Pharmacist

## 2019-07-13 NOTE — Telephone Encounter (Addendum)
Received call from Ms. Tootle that she is having increased SOB and wanted to know the process for starting a new medication for her CTEPH/Pulmonary Hypertension. I informed her that the results from her RHC (mPAP 28, PCWP 11, PVR 2.0) would not qualify her to start a second agent (ERA) because the PVR is too low (see RHC from 07/05/2019 note from Dr. Haroldine Laws). At this point she should continue her current therapy (Adempas) and follow up with Dr. Haroldine Laws as scheduled on 09/01/2019.   Her SOB has greatly worsened over the last 24-48 hours and she states it feels like the first time she had a "blood clot go to my lungs". She has not gained any weight and denies any lower extremity edema. Her IVC filter is still in place and she has been taking Xarelto as prescribed. I advised her to go to the ED for further evaluation and work-up for potential PE given her concerns. Patient agreeable. Note routed to Dr. Haroldine Laws.  Audry Riles, PharmD, BCPS, CPP Heart Failure Clinic Pharmacist [phone]'

## 2019-07-27 ENCOUNTER — Encounter (HOSPITAL_COMMUNITY): Payer: Medicare HMO

## 2019-08-02 ENCOUNTER — Ambulatory Visit (HOSPITAL_COMMUNITY)
Admission: RE | Admit: 2019-08-02 | Discharge: 2019-08-02 | Disposition: A | Discharge status: Home / Self Care | Payer: Medicare HMO | Source: Ambulatory Visit | Attending: Internal Medicine | Admitting: Internal Medicine

## 2019-08-02 ENCOUNTER — Other Ambulatory Visit: Payer: Self-pay

## 2019-08-02 DIAGNOSIS — Z452 Encounter for adjustment and management of vascular access device: Secondary | ICD-10-CM | POA: Diagnosis not present

## 2019-08-02 MED ORDER — HEPARIN SOD (PORK) LOCK FLUSH 100 UNIT/ML IV SOLN
500.0000 [IU] | INTRAVENOUS | Status: AC | PRN
  Administered 2019-08-02: 500 [IU]

## 2019-08-02 MED ORDER — SODIUM CHLORIDE 0.9% FLUSH
10.0000 mL | INTRAVENOUS | Status: AC | PRN
  Administered 2019-08-02: 10 mL

## 2019-08-02 NOTE — Progress Notes (Addendum)
PATIENT CARE CENTER NOTE    Provider: Glori Bickers, MD   Procedure: Port-a-cath flush   Note: Port-a-cath accessed and flushed using sterile technique. Patient tolerated well. PAC deaccessed. Discharge instructions given. Patient alert, oriented and ambulatory at discharge

## 2019-08-02 NOTE — Discharge Instructions (Signed)
Port-a-cath flushed with Heparin and Saline °

## 2019-08-31 ENCOUNTER — Encounter (HOSPITAL_COMMUNITY): Payer: Medicare HMO

## 2019-09-01 ENCOUNTER — Encounter (HOSPITAL_COMMUNITY): Payer: Medicare HMO | Admitting: Internal Medicine

## 2019-09-14 ENCOUNTER — Other Ambulatory Visit: Payer: Self-pay

## 2019-09-14 ENCOUNTER — Ambulatory Visit (HOSPITAL_COMMUNITY)
Admission: RE | Admit: 2019-09-14 | Discharge: 2019-09-14 | Disposition: A | Discharge status: Home / Self Care | Payer: Medicare HMO | Source: Ambulatory Visit | Attending: Internal Medicine | Admitting: Internal Medicine

## 2019-09-14 DIAGNOSIS — Z452 Encounter for adjustment and management of vascular access device: Secondary | ICD-10-CM | POA: Insufficient documentation

## 2019-09-14 LAB — COMPREHENSIVE METABOLIC PANEL
ALT: 15 U/L (ref 0–44)
AST: 16 U/L (ref 15–41)
Albumin: 3.9 g/dL (ref 3.5–5.0)
Alkaline Phosphatase: 78 U/L (ref 38–126)
Anion gap: 13 (ref 5–15)
BUN: 10 mg/dL (ref 6–20)
CO2: 26 mmol/L (ref 22–32)
Calcium: 9.4 mg/dL (ref 8.9–10.3)
Chloride: 103 mmol/L (ref 98–111)
Creatinine, Ser: 0.94 mg/dL (ref 0.44–1.00)
GFR calc Af Amer: >60 mL/min (ref >60)
GFR calc non Af Amer: >60 mL/min (ref >60)
Glucose, Bld: 104 mg/dL — ABNORMAL HIGH (ref 70–99)
Potassium: 3.5 mmol/L (ref 3.5–5.1)
Sodium: 142 mmol/L (ref 135–145)
Total Bilirubin: 0.4 mg/dL (ref 0.3–1.2)
Total Protein: 7.5 g/dL (ref 6.5–8.1)

## 2019-09-14 LAB — CBC
HCT: 39.2 % (ref 36.0–46.0)
Hemoglobin: 12.3 g/dL (ref 12.0–15.0)
MCH: 21.3 pg — ABNORMAL LOW (ref 26.0–34.0)
MCHC: 31.4 g/dL (ref 30.0–36.0)
MCV: 67.9 fL — ABNORMAL LOW (ref 80.0–100.0)
Platelets: 296 10*3/uL (ref 150–400)
RBC: 5.77 MIL/uL — ABNORMAL HIGH (ref 3.87–5.11)
RDW: 19.4 % — ABNORMAL HIGH (ref 11.5–15.5)
WBC: 6.7 10*3/uL (ref 4.0–10.5)
nRBC: 0 % (ref 0.0–0.2)

## 2019-09-14 LAB — TSH: TSH: 0.702 u[IU]/mL (ref 0.350–4.500)

## 2019-09-14 LAB — HEMOGLOBIN A1C
Hgb A1c MFr Bld: 6.3 % — ABNORMAL HIGH (ref 4.8–5.6)
Mean Plasma Glucose: 134.11 mg/dL

## 2019-09-14 MED ORDER — HEPARIN SOD (PORK) LOCK FLUSH 100 UNIT/ML IV SOLN
500.0000 [IU] | INTRAVENOUS | Status: AC | PRN
  Administered 2019-09-14: 500 [IU]

## 2019-09-14 MED ORDER — SODIUM CHLORIDE 0.9% FLUSH
10.0000 mL | INTRAVENOUS | Status: AC | PRN
  Administered 2019-09-14: 10 mL

## 2019-09-14 NOTE — Discharge Instructions (Signed)
Port-a-cath flushed with Heparin and Sodium Chloride Labs drawn

## 2019-09-14 NOTE — Progress Notes (Signed)
PATIENT CARE CENTER NOTE    Provider: Eldridge Abrahams, MD   Procedure: Port-a-cath flush and lab draw   Note: Port-a-cath accessed and flushed using sterile technique. Labs drawn from [facility] per Dr. Toney Rakes' order (CBC, CMP, A1C, TSH). Patient tolerated well. Discharge instructions given. Patient alert, oriented and ambulatory at discharge.

## 2019-09-18 ENCOUNTER — Emergency Department (HOSPITAL_COMMUNITY): Payer: Medicare HMO

## 2019-09-18 ENCOUNTER — Other Ambulatory Visit: Payer: Self-pay

## 2019-09-18 ENCOUNTER — Encounter (HOSPITAL_COMMUNITY): Payer: Self-pay | Admitting: Emergency Medicine

## 2019-09-18 ENCOUNTER — Emergency Department (HOSPITAL_COMMUNITY)
Admission: EM | Admit: 2019-09-18 | Discharge: 2019-09-18 | Disposition: A | Discharge status: Home / Self Care | Payer: Medicare HMO | Attending: Emergency Medicine | Admitting: Emergency Medicine

## 2019-09-18 DIAGNOSIS — R519 Headache, unspecified: Secondary | ICD-10-CM | POA: Diagnosis not present

## 2019-09-18 DIAGNOSIS — I2724 Chronic thromboembolic pulmonary hypertension: Secondary | ICD-10-CM | POA: Diagnosis not present

## 2019-09-18 DIAGNOSIS — R0602 Shortness of breath: Secondary | ICD-10-CM | POA: Diagnosis not present

## 2019-09-18 DIAGNOSIS — R42 Dizziness and giddiness: Secondary | ICD-10-CM | POA: Insufficient documentation

## 2019-09-18 DIAGNOSIS — R0789 Other chest pain: Secondary | ICD-10-CM | POA: Insufficient documentation

## 2019-09-18 DIAGNOSIS — I509 Heart failure, unspecified: Secondary | ICD-10-CM | POA: Insufficient documentation

## 2019-09-18 DIAGNOSIS — Z86711 Personal history of pulmonary embolism: Secondary | ICD-10-CM | POA: Insufficient documentation

## 2019-09-18 DIAGNOSIS — Z7901 Long term (current) use of anticoagulants: Secondary | ICD-10-CM | POA: Diagnosis not present

## 2019-09-18 DIAGNOSIS — R05 Cough: Secondary | ICD-10-CM | POA: Diagnosis not present

## 2019-09-18 DIAGNOSIS — I110 Hypertensive heart disease with heart failure: Secondary | ICD-10-CM | POA: Insufficient documentation

## 2019-09-18 DIAGNOSIS — Z79899 Other long term (current) drug therapy: Secondary | ICD-10-CM | POA: Insufficient documentation

## 2019-09-18 DIAGNOSIS — Z20822 Contact with and (suspected) exposure to covid-19: Secondary | ICD-10-CM | POA: Diagnosis not present

## 2019-09-18 LAB — BASIC METABOLIC PANEL
Anion gap: 9 (ref 5–15)
BUN: 12 mg/dL (ref 6–20)
CO2: 25 mmol/L (ref 22–32)
Calcium: 9.4 mg/dL (ref 8.9–10.3)
Chloride: 107 mmol/L (ref 98–111)
Creatinine, Ser: 0.94 mg/dL (ref 0.44–1.00)
GFR calc Af Amer: >60 mL/min (ref >60)
GFR calc non Af Amer: >60 mL/min (ref >60)
Glucose, Bld: 109 mg/dL — ABNORMAL HIGH (ref 70–99)
Potassium: 3.9 mmol/L (ref 3.5–5.1)
Sodium: 141 mmol/L (ref 135–145)

## 2019-09-18 LAB — CBC
HCT: 40.6 % (ref 36.0–46.0)
Hemoglobin: 12.4 g/dL (ref 12.0–15.0)
MCH: 21 pg — ABNORMAL LOW (ref 26.0–34.0)
MCHC: 30.5 g/dL (ref 30.0–36.0)
MCV: 68.7 fL — ABNORMAL LOW (ref 80.0–100.0)
Platelets: 294 10*3/uL (ref 150–400)
RBC: 5.91 MIL/uL — ABNORMAL HIGH (ref 3.87–5.11)
RDW: 19.2 % — ABNORMAL HIGH (ref 11.5–15.5)
WBC: 6.7 10*3/uL (ref 4.0–10.5)
nRBC: 0 % (ref 0.0–0.2)

## 2019-09-18 LAB — TROPONIN I (HIGH SENSITIVITY)
Troponin I (High Sensitivity): <2 ng/L (ref <18)
Troponin I (High Sensitivity): <2 ng/L (ref <18)

## 2019-09-18 LAB — RESPIRATORY PANEL BY RT PCR (FLU A&B, COVID)
Influenza A by PCR: NEGATIVE
Influenza B by PCR: NEGATIVE
SARS Coronavirus 2 by RT PCR: NEGATIVE

## 2019-09-18 LAB — POC SARS CORONAVIRUS 2 AG -  ED: SARS Coronavirus 2 Ag: NEGATIVE

## 2019-09-18 LAB — BRAIN NATRIURETIC PEPTIDE: B Natriuretic Peptide: 25.7 pg/mL (ref 0.0–100.0)

## 2019-09-18 MED ORDER — SODIUM CHLORIDE 0.9% FLUSH: 3.0000 mL | Freq: Once | INTRAVENOUS | Status: DC

## 2019-09-18 MED ORDER — SODIUM CHLORIDE 0.9% FLUSH: 10.0000 mL | INTRAVENOUS | Status: DC | PRN

## 2019-09-18 MED ORDER — CHLORHEXIDINE GLUCONATE CLOTH 2 % EX PADS: 6.0000 | MEDICATED_PAD | Freq: Every day | CUTANEOUS | Status: DC

## 2019-09-18 MED ORDER — IOHEXOL 350 MG/ML SOLN
100.0000 mL | Freq: Once | INTRAVENOUS | Status: AC | PRN
  Administered 2019-09-18: 60 mL via INTRAVENOUS

## 2019-09-18 MED ORDER — HEPARIN SOD (PORK) LOCK FLUSH 100 UNIT/ML IV SOLN
500.0000 [IU] | Freq: Once | INTRAVENOUS | Status: AC
  Administered 2019-09-18: 500 [IU] via INTRAVENOUS
  Filled 2019-09-18: qty 5

## 2019-09-18 MED ORDER — ONDANSETRON HCL 4 MG/2ML IJ SOLN
4.0000 mg | Freq: Once | INTRAMUSCULAR | Status: AC
  Administered 2019-09-18: 4 mg via INTRAVENOUS
  Filled 2019-09-18: qty 2

## 2019-09-18 MED ORDER — OXYCODONE-ACETAMINOPHEN 5-325 MG PO TABS
1.0000 | ORAL_TABLET | Freq: Once | ORAL | Status: AC
  Administered 2019-09-18: 1 via ORAL
  Filled 2019-09-18: qty 1

## 2019-09-18 NOTE — Discharge Instructions (Addendum)
As discussed, your evaluation today has been largely reassuring.  But, it is important that you monitor your condition carefully, and do not hesitate to return to the ED if you develop new, or concerning changes in your condition. ? ?Otherwise, please follow-up with your physician for appropriate ongoing care. ? ?

## 2019-09-18 NOTE — ED Provider Notes (Signed)
[facility] EMERGENCY DEPARTMENT Provider Note   CSN: [id number] Arrival date & time: 09/18/19  1153     History Chief Complaint  Patient presents with  . ? COVID  . Shortness of Breath  . Cough  . Chest Pain    [name] [name] is a 59 y.o. female.  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  Presents to ER with chief complaint of shortness of breath.  Patient states for the last few days has noted significant shortness of breath, worse with exertion.  States she has not some baseline shortness of breath but this is worse than normal.  Also has had associated cough, nonproductive mostly, occasional yellow phlegm.  Has also noted mild to moderate headache, left-sided, no associated numbness, weakness, vision changes.  No chest pain currently, has noted occasional chest pains while coughing.  No recent changes in her medications.  States no fever but has had exposure to her adopted son who has recently been diagnosed with COVID-19.  HPI     Past Medical History:  Diagnosis Date  . Anxiety   . Arthritis   . CHF exacerbation ([place]) 08/21/2017  . Depression   . Difficult intubation    See [facility] anesthesia records in [place]  . Diverticulitis   . Dyspnea    with exertion  . Family history of adverse reaction to anesthesia    " My mother stopped breathing during procedure"  . GERD (gastroesophageal reflux disease)    PMH; resolved  . Hyperlipidemia   . Hypertension   . Obesity   . Plantar fasciitis, left   . Pneumonia   . Port-A-Cath in place   . Pre-diabetes   . Pulmonary embolism ([place])   . Pulmonary hypertension ([place])   . Sleep apnea    wears CPAP # 9  . Wears glasses     Patient Active Problem List   Diagnosis Date Noted  . Achilles tendon contracture, left   . Leg pain, left 07/10/2018  .  Bilateral primary osteoarthritis of knee 05/12/2018  . Plantar fasciitis of left foot 05/12/2018  . Shortness of breath 01/16/2018  . Palliative care encounter 01/16/2018  . Major depressive disorder, recurrent severe without psychotic features ([place]) 12/06/2017  . CHF exacerbation ([place]) 08/21/2017  . (HFpEF) heart failure with preserved ejection fraction ([place]) 08/20/2017  . Lumbar radiculopathy 08/08/2017  . Body mass index 50.0-59.9, adult ([place]) 08/08/2017  . Hoarseness 08/08/2017  . Chronic cough 04/10/2017  . Rheumatoid arthritis involving multiple sites ([place]) 04/10/2017  . Anxiety and depression 04/10/2017  . Adjustment disorder with depressed mood 12/11/2016  . Morbid obesity ([place]) 08/14/2016  . Stroke ([place]) 06/27/2016  . Right sided weakness 06/27/2016  . Essential hypertension 06/27/2016  . Atypical chest pain 06/27/2016  . Difficult airway 03/29/2016  . CTEPH (chronic thromboembolic pulmonary hypertension) ([place]) 03/14/2016  . OSA on CPAP 02/20/2016  . Chronic pain syndrome 01/31/2016  . Pulmonary embolus ([place]) 11/15/2015  . Vertigo 11/15/2015  . Depression 11/15/2015    Past Surgical History:  Procedure Laterality Date  . ABDOMINAL HYSTERECTOMY    . BREAST REDUCTION SURGERY    . CHOLECYSTECTOMY    . COLONOSCOPY W/ BIOPSIES AND POLYPECTOMY    . EMBOLECTOMY N/A    pulmonary embolectomy  . FRACTURE SURGERY     right ankle  . GASTROCNEMIUS RECESSION Left  02/12/2019   Procedure: LEFT GASTROCNEMIUS RECESSION, PLANTAR FASCIA RELEASE;  Surgeon: Newt Minion, MD;  Location: [facility];  Service: Orthopedics;  Laterality: Left;  . IVC FILTER INSERTION    . REDUCTION MAMMAPLASTY Bilateral   . RIGHT HEART CATH N/A 10/28/2016   Procedure: Right Heart Cath;  Surgeon: Jolaine Artist, MD;  Location: [facility];  Service: Cardiovascular;  Laterality: N/A;  . RIGHT HEART CATH N/A 08/26/2017   Procedure: RIGHT HEART CATH;  Surgeon: Jolaine Artist, MD;  Location: [facility];  Service: Cardiovascular;  Laterality: N/A;  . RIGHT HEART CATH N/A 07/05/2019   Procedure: RIGHT HEART CATH;  Surgeon: Jolaine Artist, MD;  Location: [facility];  Service: Cardiovascular;  Laterality: N/A;  . ULTRASOUND GUIDANCE FOR VASCULAR ACCESS  08/26/2017   Procedure: Ultrasound Guidance For Vascular Access;  Surgeon: Jolaine Artist, MD;  Location: [facility];  Service: Cardiovascular;;  . WISDOM TOOTH EXTRACTION       OB History   No obstetric history on file.     Family History  Problem Relation Age of Onset  . Lung cancer Maternal Grandmother   . Dementia Mother   . Dementia Father   . Anxiety disorder Sister   . Bipolar disorder Sister   . Drug abuse Sister   . Sexual abuse Maternal Aunt   . Anxiety disorder Cousin   . Anxiety disorder Sister   . Bipolar disorder Sister   . Drug abuse Sister   . Breast cancer Neg Hx     Social History   Tobacco Use  . Smoking status: Never Smoker  . Smokeless tobacco: Never Used  Substance Use Topics  . Alcohol use: No  . Drug use: No    Home Medications Prior to Admission medications   Medication Sig Start Date End Date Taking? Authorizing Provider  acetaminophen-codeine (TYLENOL #4) 300-60 MG tablet Take 1 tablet by mouth every 4 (four) hours as needed for pain.    [provider]  atorvastatin (LIPITOR) 20 MG tablet Take 1 tablet (20 mg total) by mouth daily at 6 PM. 09/17/18   Ladell Pier, MD  cyclobenzaprine (FLEXERIL) 5 MG tablet TAKE 1 TABLET BY MOUTH TWICE DAILY AS NEEDED FOR MUSCLE SPASM Patient taking differently: Take 5 mg by mouth 2 (two) times daily as needed for muscle spasms.  09/14/18   Ladell Pier, MD  diclofenac sodium (VOLTAREN) 1 % GEL Apply 2 g topically 4 (four) times daily as needed (knee and back pain).    [provider]  docusate sodium (COLACE) 100 MG capsule Take 1 capsule (100 mg total) by mouth every 12 (twelve) hours. 10/24/16    Harris, Abigail, PA-C  fluticasone (FLONASE) 50 MCG/ACT nasal spray Place 2 sprays into both nostrils daily. 02/04/17   Maren Reamer, MD  furosemide (LASIX) 40 MG tablet Take 1 tablet (40 mg total) by mouth daily. Take extra 40 mg tablet once in the afternoon AS NEEDED for weight gain 3 lbs or more. 09/17/18   Ladell Pier, MD  gabapentin (NEURONTIN) 300 MG capsule Take 2 capsules (600 mg total) by mouth 3 (three) times daily. Patient taking differently: Take 300 mg by mouth 3 (three) times daily.  09/17/18   Ladell Pier, MD  LORazepam (ATIVAN) 0.5 MG tablet Take 0.5 mg by mouth every 8 (eight) hours as needed for anxiety or sleep.    [provider]  meclizine (  ANTIVERT) 25 MG tablet TAKE 1 TABLET BY MOUTH TWICE DAILY AS NEEDED FOR  DIZZINESS Patient taking differently: Take 25 mg by mouth 2 (two) times daily as needed for dizziness. TAKE 1 TABLET BY MOUTH TWICE DAILY AS NEEDED FOR  DIZZINESS 09/28/18   Ladell Pier, MD  Melatonin 5 MG TABS Take 5 mg by mouth at bedtime.    [provider]  mirtazapine (REMERON) 15 MG tablet Take 15 mg by mouth at bedtime. Total dosage is 60 mg    [provider]  mirtazapine (REMERON) 45 MG tablet Take 45 mg by mouth at bedtime. Total dosage is 60 mg    [provider]  polyethylene glycol powder (GLYCOLAX/MIRALAX) powder Take 17 g by mouth 2 (two) times daily as needed. 06/03/18   Argentina Donovan, PA-C  Riociguat (ADEMPAS) 2 MG TABS Take 2 mg by mouth 3 (three) times daily. Patient taking differently: Take 1 mg by mouth 3 (three) times daily.  06/02/19   Bensimhon, Shaune Pascal, MD  rivaroxaban (XARELTO) 20 MG TABS tablet Take 1 tablet (20 mg total) by mouth daily with supper. 05/24/19   Mannam, Hart Robinsons, MD  spironolactone (ALDACTONE) 25 MG tablet Take 0.5 tablets (12.5 mg total) by mouth daily. Patient taking differently: Take 12.5 mg by mouth every other day.  02/17/18   Bensimhon, Shaune Pascal, MD  traZODone  (DESYREL) 100 MG tablet Take 150 mg by mouth at bedtime as needed for sleep.     [provider]    Allergies    [name].  Review of Systems   Review of Systems  Constitutional: Negative for chills and fever.  HENT: Negative for ear pain and sore throat.   Eyes: Negative for pain and visual disturbance.  Respiratory: Positive for shortness of breath. Negative for cough.   Cardiovascular: Positive for chest pain. Negative for palpitations.  Gastrointestinal: Negative for abdominal pain and vomiting.  Genitourinary: Negative for dysuria and hematuria.  Musculoskeletal: Positive for arthralgias. Negative for back pain.  Skin: Negative for color change and rash.  Neurological: Positive for headaches. Negative for seizures and syncope.  All other systems reviewed and are negative.   Physical Exam Updated Vital Signs BP (!) 132/111 (BP Location: Right Arm)   Pulse (!) 105   Temp 98.6 F (37 C) (Oral)   Resp 18   SpO2 97%   Physical Exam Vitals and nursing note reviewed.  Constitutional:      General: She is not in acute distress.    Appearance: She is well-developed.     Comments: Morbidly obese  HENT:     Head: Normocephalic and atraumatic.  Eyes:     Conjunctiva/sclera: Conjunctivae normal.  Cardiovascular:     Rate and Rhythm: Normal rate and regular rhythm.     Heart sounds: No murmur.  Pulmonary:     Effort: Pulmonary effort is normal. No respiratory distress.     Breath sounds: Normal breath sounds.  Chest:     Chest wall: No mass or deformity.  Abdominal:     Palpations: Abdomen is soft.     Tenderness: There is no abdominal tenderness.  Musculoskeletal:     Cervical back: Neck supple.     Right lower leg: No edema.     Left lower leg: No edema.  Skin:    General: Skin is warm and dry.     Capillary Refill: Capillary refill takes less than 2 seconds.  Neurological:  General: No focal deficit present.     Mental Status:  She is alert and oriented to person, place, and time.  Psychiatric:        Mood and Affect: Mood normal.        Behavior: Behavior normal.     ED Results / Procedures / Treatments   Labs (all labs ordered are listed, but only abnormal results are displayed) Labs Reviewed  BASIC METABOLIC PANEL - Abnormal; Notable for the following components:      Result Value   Glucose, Bld 109 (*)    All other components within normal limits  CBC - Abnormal; Notable for the following components:   RBC 5.91 (*)    MCV 68.7 (*)    MCH 21.0 (*)    RDW 19.2 (*)    All other components within normal limits  RESPIRATORY PANEL BY RT PCR (FLU A&B, COVID)  BRAIN NATRIURETIC PEPTIDE  POC SARS CORONAVIRUS 2 AG -  ED  TROPONIN I (HIGH SENSITIVITY)  TROPONIN I (HIGH SENSITIVITY)    EKG EKG Interpretation  Date/Time:  Saturday September 18 2019 11:59:38 EST Ventricular Rate:  108 PR Interval:  180 QRS Duration: 114 QT Interval:  360 QTC Calculation: 482 R Axis:   -23 Text Interpretation: Sinus tachycardia Low voltage QRS Right bundle branch block T wave abnormality, consider lateral ischemia Abnormal ECG no acute changes Confirmed by Madalyn Rob [id number]) on 09/18/2019 1:07:59 PM   Radiology DG Chest Portable 1 View  Result Date: 09/18/2019 CLINICAL DATA:  Shortness of breath.  Cough. EXAM: PORTABLE CHEST 1 VIEW COMPARISON:  February 10, 2019 FINDINGS: Lungs are clear. Heart is mildly enlarged with pulmonary vascularity normal. No adenopathy. Patient is status post median sternotomy. Port-A-Cath tip is in the superior vena cava. No pneumothorax. No bone lesions. IMPRESSION: Lungs clear. Mild cardiomegaly. Status post median sternotomy. Port-A-Cath tip in superior vena cava. Electronically Signed   By: Lowella Grip III M.D.   On: 09/18/2019 13:44    Procedures Procedures (including critical care time)  Medications Ordered in ED Medications  sodium chloride flush (NS) 0.9 % injection 3 mL (has no  administration in time range)  oxyCODONE-acetaminophen (PERCOCET/ROXICET) 5-325 MG per tablet 1 tablet (has no administration in time range)    ED Course  I have reviewed the triage vital signs and the nursing notes.  Pertinent labs & imaging results that were available during my care of the patient were reviewed by me and considered in my medical decision making (see chart for details).    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  Presented to ER with worsening shortness of breath as well as flulike symptoms.  High clinical suspicion for COVID-19, antigen negative, will send for PCR.  CXR without obvious infiltrate.  Given her extensive medical problems, significant worsening in her shortness of breath, obtain CTA chest to rule out recurrent or worsening PE.  While awaiting CTA chest, Covid results, patient signed out to Dr. Vanita Panda.  Refer to his note for final plan and disposition.  Final Clinical Impression(s) / ED Diagnoses Final diagnoses:  Suspected COVID-19 virus infection  SOB (shortness of breath)    Rx / DC Orders  ED Discharge Orders    None       Lucrezia Starch, MD 09/18/19 1510

## 2019-09-18 NOTE — ED Notes (Signed)
Pt called out asking for some pain meds for headache.

## 2019-09-18 NOTE — ED Notes (Addendum)
PT would like port-a-cath accessed for CT.  States it is difficult to access and IV team usually has to do it.

## 2019-09-18 NOTE — ED Notes (Signed)
Patient verbalizes understanding of discharge instructions. Opportunity for questioning and answers were provided. Armband removed by staff, pt discharged from ED ambulatory.   

## 2019-09-18 NOTE — ED Triage Notes (Signed)
Pt states grandson tested positive for COVID.  Pt reports SOB, productive cough with yellow phlegm, dizziness, and pain across chest x 10 days.

## 2019-09-18 NOTE — ED Notes (Signed)
IV team at bedside 

## 2019-09-18 NOTE — ED Notes (Signed)
Walked patient to the bathroom patient did good going down the hall O2 stats stayed at 98 percent room air but after patient came out of the bathroom patient was dizzy and felt like faint her stats went down to 88 but after sitting down to be wheeled back patient O2 went back up to 98 percent patient is now back in bed with call bell in reach

## 2019-09-18 NOTE — ED Notes (Signed)
Got patient into a gown on the monitor patient is resting with call bell in reach 

## 2019-09-23 ENCOUNTER — Encounter (HOSPITAL_COMMUNITY): Payer: Medicare HMO | Admitting: Internal Medicine

## 2019-09-30 ENCOUNTER — Encounter: Payer: Self-pay | Admitting: Orthopaedic Surgery

## 2019-09-30 ENCOUNTER — Other Ambulatory Visit: Payer: Self-pay

## 2019-09-30 ENCOUNTER — Ambulatory Visit (INDEPENDENT_AMBULATORY_CARE_PROVIDER_SITE_OTHER): Payer: Medicare HMO

## 2019-09-30 ENCOUNTER — Ambulatory Visit (INDEPENDENT_AMBULATORY_CARE_PROVIDER_SITE_OTHER): Payer: Medicare HMO | Admitting: Orthopaedic Surgery

## 2019-09-30 DIAGNOSIS — M1711 Unilateral primary osteoarthritis, right knee: Secondary | ICD-10-CM

## 2019-09-30 DIAGNOSIS — Z6841 Body Mass Index (BMI) 40.0 and over, adult: Secondary | ICD-10-CM

## 2019-09-30 MED ORDER — LIDOCAINE HCL 1 % IJ SOLN
2.0000 mL | INTRAMUSCULAR | Status: AC | PRN
  Administered 2019-09-30: 10:00:00 2 mL

## 2019-09-30 MED ORDER — METHYLPREDNISOLONE ACETATE 40 MG/ML IJ SUSP
40.0000 mg | INTRAMUSCULAR | Status: AC | PRN
  Administered 2019-09-30: 40 mg via INTRA_ARTICULAR

## 2019-09-30 MED ORDER — BUPIVACAINE HCL 0.25 % IJ SOLN
2.0000 mL | INTRAMUSCULAR | Status: AC | PRN
  Administered 2019-09-30: 2 mL via INTRA_ARTICULAR

## 2019-09-30 NOTE — Progress Notes (Signed)
Office Visit Note   Patient: [name] [name]           Date of Birth: 1960-12-17           MRN: [id number] Visit Date: 09/30/2019              Requested by: Ruthine Dose, [place],   [id number] PCP: Ruthine Dose, MD   Assessment & Plan: Visit Diagnoses:  1. Primary osteoarthritis of right knee   2. Morbid obesity ([place])   3. Body mass index 50.0-59.9, adult (H[facility])     Plan: Impression is advanced generative joint disease right knee.  We will inject the right knee with cortisone today.  She will make all efforts at weight loss.  Follow-up with [place] as needed.  The patient meets the AMA guidelines for Morbid (severe) obesity with a BMI > 40.0 and I have recommended weight loss.  Follow-Up Instructions: Return if symptoms worsen or fail to improve.   Orders:  Orders Placed This Encounter  Procedures  . Large Joint Inj: R knee  . XR KNEE 3 VIEW RIGHT   No orders of the defined types were placed in this encounter.     Procedures: Large Joint Inj: R knee on 09/30/2019 10:07 AM Indications: pain Details: 22 G needle, anterolateral approach Medications: 2 mL lidocaine 1 %; 2 mL bupivacaine 0.25 %; 40 mg methylPREDNISolone acetate 40 MG/ML      Clinical Data: No additional findings.   Subjective: Chief Complaint  Patient presents with  . Right Knee - Pain    HPI patient is a pleasant 59 year old female who presents our clinic today with right knee pain.  History of advanced generative joint disease to the right knee.  She has had pain here for many years.  The pain she has is primarily to the medial aspect.  She describes this as a constant ache worse with walking.  She does have to ambulate with a cane due to occasional instability.  She takes an occasional Tylenol 3 and uses Voltaren gel without relief of symptoms.  She does have a history of knee arthroscopy and previous cortisone injections.  She is currently seeing a weight loss clinic trying  to get to a BMI of under 40 in order to proceed with definitive treatment of a total knee replacement.  Review of Systems was detailed in HPI.  All others reviewed and are negative.   Objective: Vital Signs: There were no vitals taken for this visit.  Physical Exam well-developed well-nourished female no acute distress.  Alert oriented x3.  Ortho Exam examination of the right knee shows range of motion from 0 to 95 degrees.  Medial joint line tenderness.  Ligaments are stable.  She is neurovascular intact distally.  Specialty Comments:  No specialty comments available.  Imaging: XR KNEE 3 VIEW RIGHT  Result Date: 09/30/2019 Marked medial and patellofemoral compartment changes    PMFS History: Patient Active Problem List   Diagnosis Date Noted  . Primary osteoarthritis of right knee 09/30/2019  . Achilles tendon contracture, left   . Leg pain, left 07/10/2018  . Bilateral primary osteoarthritis of knee 05/12/2018  . Plantar fasciitis of left foot 05/12/2018  . Shortness of breath 01/16/2018  . Palliative care encounter 01/16/2018  . Major depressive disorder, recurrent severe without psychotic features ([place]) 12/06/2017  . CHF exacerbation ([place]) 08/21/2017  . (HFpEF) heart failure with preserved ejection fraction ([place]) 08/20/2017  . Lumbar radiculopathy 08/08/2017  . Body  mass index 50.0-59.9, adult ([place]) 08/08/2017  . Hoarseness 08/08/2017  . Chronic cough 04/10/2017  . Rheumatoid arthritis involving multiple sites ([place]) 04/10/2017  . Anxiety and depression 04/10/2017  . Adjustment disorder with depressed mood 12/11/2016  . Morbid obesity ([place]) 08/14/2016  . Stroke ([place]) 06/27/2016  . Right sided weakness 06/27/2016  . Essential hypertension 06/27/2016  . Atypical chest pain 06/27/2016  . Difficult airway 03/29/2016  . CTEPH (chronic thromboembolic pulmonary hypertension) ([place]) 03/14/2016  . OSA on CPAP 02/20/2016  . Chronic pain syndrome 01/31/2016  . Pulmonary  embolus ([place]) 11/15/2015  . Vertigo 11/15/2015  . Depression 11/15/2015   Past Medical History:  Diagnosis Date  . Anxiety   . Arthritis   . CHF exacerbation ([place]) 08/21/2017  . Depression   . Difficult intubation    See [facility] anesthesia records in [place]  . Diverticulitis   . Dyspnea    with exertion  . Family history of adverse reaction to anesthesia    " My mother stopped breathing during procedure"  . GERD (gastroesophageal reflux disease)    PMH; resolved  . Hyperlipidemia   . Hypertension   . Obesity   . Plantar fasciitis, left   . Pneumonia   . Port-A-Cath in place   . Pre-diabetes   . Pulmonary embolism ([place])   . Pulmonary hypertension ([place])   . Sleep apnea    wears CPAP # 9  . Wears glasses     Family History  Problem Relation Age of Onset  . Lung cancer Maternal Grandmother   . Dementia Mother   . Dementia Father   . Anxiety disorder Sister   . Bipolar disorder Sister   . Drug abuse Sister   . Sexual abuse Maternal Aunt   . Anxiety disorder Cousin   . Anxiety disorder Sister   . Bipolar disorder Sister   . Drug abuse Sister   . Breast cancer Neg Hx     Past Surgical History:  Procedure Laterality Date  . ABDOMINAL HYSTERECTOMY    . BREAST REDUCTION SURGERY    . CHOLECYSTECTOMY    . COLONOSCOPY W/ BIOPSIES AND POLYPECTOMY    . EMBOLECTOMY N/A    pulmonary embolectomy  . FRACTURE SURGERY     right ankle  . GASTROCNEMIUS RECESSION Left 02/12/2019   Procedure: LEFT GASTROCNEMIUS RECESSION, PLANTAR FASCIA RELEASE;  Surgeon: Newt Minion, MD;  Location: [facility];  Service: Orthopedics;  Laterality: Left;  . IVC FILTER INSERTION    . REDUCTION MAMMAPLASTY Bilateral   . RIGHT HEART CATH N/A 10/28/2016   Procedure: Right Heart Cath;  Surgeon: Jolaine Artist, MD;  Location: [facility];  Service: Cardiovascular;  Laterality: N/A;  . RIGHT HEART CATH N/A 08/26/2017   Procedure: RIGHT HEART CATH;  Surgeon: Jolaine Artist, MD;   Location: [facility];  Service: Cardiovascular;  Laterality: N/A;  . RIGHT HEART CATH N/A 07/05/2019   Procedure: RIGHT HEART CATH;  Surgeon: Jolaine Artist, MD;  Location: [facility];  Service: Cardiovascular;  Laterality: N/A;  . ULTRASOUND GUIDANCE FOR VASCULAR ACCESS  08/26/2017   Procedure: Ultrasound Guidance For Vascular Access;  Surgeon: Jolaine Artist, MD;  Location: [facility];  Service: Cardiovascular;;  . WISDOM TOOTH EXTRACTION     Social History   Occupational History  . Not on file  Tobacco Use  . Smoking status: Never Smoker  . Smokeless tobacco: Never Used  Substance and Sexual Activity  .  Alcohol use: No  . Drug use: No  . Sexual activity: Yes    Partners: Male    Birth control/protection: Surgical

## 2019-10-04 ENCOUNTER — Telehealth: Payer: Self-pay | Admitting: Orthopaedic Surgery

## 2019-10-04 NOTE — Telephone Encounter (Signed)
Patient called.   She wants to speak with her provider regarding an injection she received.   Call back: [phone]

## 2019-10-06 ENCOUNTER — Telehealth: Payer: Self-pay | Admitting: Orthopaedic Surgery

## 2019-10-06 NOTE — Telephone Encounter (Signed)
Pt called regarding a right knee inj. Pt stated her knee is giving out and she is in severe pain.   [phone]

## 2019-10-06 NOTE — Telephone Encounter (Signed)
I would recommend trying wearing a knee brace.

## 2019-10-06 NOTE — Telephone Encounter (Signed)
See other message

## 2019-10-07 ENCOUNTER — Telehealth: Payer: Self-pay | Admitting: Orthopaedic Surgery

## 2019-10-07 NOTE — Telephone Encounter (Signed)
Pt called in very upset said she's been calling for the past 3 days now trying to get a call back from Dr.Xu or someone on his team regarding her severe pain in her right knee. I did advise pt that dr.xu did recommend  her wear a knee brace, put pt said she would like a call back from someone explaining that to her.   [phone]

## 2019-10-07 NOTE — Telephone Encounter (Signed)
Advised patient

## 2019-10-07 NOTE — Telephone Encounter (Signed)
Called patient back to advise. She understands and was no upset when I called her. She will call us back next week if not any better then we can do a brace or F/U with Dr Erlinda Hong.

## 2019-10-12 ENCOUNTER — Ambulatory Visit (HOSPITAL_COMMUNITY): Payer: Medicare HMO

## 2019-10-13 ENCOUNTER — Encounter (HOSPITAL_COMMUNITY): Payer: Medicare HMO | Admitting: Internal Medicine

## 2019-10-18 ENCOUNTER — Telehealth: Payer: Self-pay | Admitting: Physician Assistant

## 2019-10-18 NOTE — Telephone Encounter (Signed)
Patient called advised she has tried the brace and had the injection and nothing is working. Patient said her knees are giving out on her especially the right knee. Patient asked if she can get set up for an MRI? The number to contact patient is [phone] or [phone]

## 2019-10-19 ENCOUNTER — Telehealth (HOSPITAL_COMMUNITY): Payer: Self-pay

## 2019-10-19 NOTE — Telephone Encounter (Signed)

## 2019-10-20 ENCOUNTER — Ambulatory Visit (HOSPITAL_COMMUNITY)
Admission: RE | Admit: 2019-10-20 | Discharge: 2019-10-20 | Disposition: A | Discharge status: Home / Self Care | Payer: Medicare HMO | Source: Ambulatory Visit | Attending: Internal Medicine | Admitting: Internal Medicine

## 2019-10-20 ENCOUNTER — Encounter (HOSPITAL_COMMUNITY): Payer: Self-pay | Admitting: Internal Medicine

## 2019-10-20 ENCOUNTER — Other Ambulatory Visit: Payer: Self-pay

## 2019-10-20 DIAGNOSIS — I2724 Chronic thromboembolic pulmonary hypertension: Secondary | ICD-10-CM

## 2019-10-20 DIAGNOSIS — Z791 Long term (current) use of non-steroidal anti-inflammatories (NSAID): Secondary | ICD-10-CM | POA: Insufficient documentation

## 2019-10-20 DIAGNOSIS — E785 Hyperlipidemia, unspecified: Secondary | ICD-10-CM | POA: Insufficient documentation

## 2019-10-20 DIAGNOSIS — I509 Heart failure, unspecified: Secondary | ICD-10-CM | POA: Insufficient documentation

## 2019-10-20 DIAGNOSIS — I272 Pulmonary hypertension, unspecified: Secondary | ICD-10-CM

## 2019-10-20 DIAGNOSIS — Z9989 Dependence on other enabling machines and devices: Secondary | ICD-10-CM

## 2019-10-20 DIAGNOSIS — Z6841 Body Mass Index (BMI) 40.0 and over, adult: Secondary | ICD-10-CM | POA: Insufficient documentation

## 2019-10-20 DIAGNOSIS — R079 Chest pain, unspecified: Secondary | ICD-10-CM | POA: Diagnosis not present

## 2019-10-20 DIAGNOSIS — F419 Anxiety disorder, unspecified: Secondary | ICD-10-CM | POA: Diagnosis not present

## 2019-10-20 DIAGNOSIS — M797 Fibromyalgia: Secondary | ICD-10-CM | POA: Insufficient documentation

## 2019-10-20 DIAGNOSIS — I2782 Chronic pulmonary embolism: Secondary | ICD-10-CM | POA: Insufficient documentation

## 2019-10-20 DIAGNOSIS — F329 Major depressive disorder, single episode, unspecified: Secondary | ICD-10-CM | POA: Diagnosis not present

## 2019-10-20 DIAGNOSIS — M1990 Unspecified osteoarthritis, unspecified site: Secondary | ICD-10-CM | POA: Diagnosis not present

## 2019-10-20 DIAGNOSIS — K219 Gastro-esophageal reflux disease without esophagitis: Secondary | ICD-10-CM | POA: Diagnosis not present

## 2019-10-20 DIAGNOSIS — Z79899 Other long term (current) drug therapy: Secondary | ICD-10-CM | POA: Diagnosis not present

## 2019-10-20 DIAGNOSIS — Z452 Encounter for adjustment and management of vascular access device: Secondary | ICD-10-CM | POA: Diagnosis not present

## 2019-10-20 DIAGNOSIS — G4733 Obstructive sleep apnea (adult) (pediatric): Secondary | ICD-10-CM | POA: Diagnosis not present

## 2019-10-20 DIAGNOSIS — Z7901 Long term (current) use of anticoagulants: Secondary | ICD-10-CM | POA: Insufficient documentation

## 2019-10-20 DIAGNOSIS — G8929 Other chronic pain: Secondary | ICD-10-CM | POA: Insufficient documentation

## 2019-10-20 DIAGNOSIS — G894 Chronic pain syndrome: Secondary | ICD-10-CM | POA: Insufficient documentation

## 2019-10-20 NOTE — Telephone Encounter (Signed)
Unfortunately, knee replacement and weight loss is only thing that will really help.  MRI is not warranted as she has significant arthritis which is causing her pain.  We can try visco inj if she would like?

## 2019-10-20 NOTE — Patient Instructions (Signed)
It was great to see you today! No medication changes are needed at this time.   Your physician recommends that you schedule a follow-up appointment in: 6 months with Dr Haroldine Laws  Do the following things EVERYDAY: 1) Weigh yourself in the morning before breakfast. Write it down and keep it in a log. 2) Take your medicines as prescribed 3) Eat low salt foods--Limit salt (sodium) to 2000 mg per day.  4) Stay as active as you can everyday 5) Limit all fluids for the day to less than 2 liters  At the [facility], you and your health needs are our priority. As part of our continuing mission to provide you with exceptional heart care, we have created designated Provider Care Teams. These Care Teams include your primary Cardiologist (physician) and Advanced Practice Providers (APPs- Physician Assistants and Nurse Practitioners) who all work together to provide you with the care you need, when you need it.   You may see any of the following providers on your designated Care Team at your next follow up: Marland Kitchen Dr Glori Bickers . Dr Loralie Champagne . Darrick Grinder, NP . Lyda Jester, PA . Audry Riles, PharmD   Please be sure to bring in all your medications bottles to every appointment.

## 2019-10-20 NOTE — Progress Notes (Signed)
Pulmonary Hypertension Clinic Note  Date:  10/20/2019   ID:  [name], [name] 01-16-1961, MRN [id number]  PCP:  Ruthine Dose, MD  HF MD: Dr Haroldine Laws    HPI: [name] [name] is a 59 y.o. 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   In [place] in 09/2015 diagnosed with saddle PE and started on anticoagulation. She was very briefly on warfarin but quickly switched to Xarelto.   She was referred to [facility] in 12/2015. Echocardiogram (12-2015) showed normal LVEF 55-60%, normal LA, normal RV size, RVSP estimated as 70 mm Hg. She further was referred to [facility] and saw Dr. Karena Addison on 03/14/2016 where patient had several studies including V/Q scan which found high prob for PE; studies c/w CTEPH. It was felt that her chronic chest pain was caused by chronic PE. RHC/LHC/Pulm Angiogram 03/2016 confirmed diagnosis of CTEPH. She underwent pulmonary thromboembolectomy 03/31/2016 at [facility] with IVC filter placement. Pre-op cath with no CAD.   Seen by Dr. Karena Addison in 04/2016 for follow up. He felt like her heart size was decreasing. Plan was for lifelong [place] and ECHO and V/Q in 4 months.  CT angio at [facility] on 05/15/16 showed " persistent but partially recanalized PE within right lower lobe pulmonary artery. No new PE identified. "  She was admitted to [facility] in 06/2016 for continued chest pain and parasthesias. V/Q scan on 06/27/16 showed prominent ventilation perfusion mismatch in right c/w chronic PE. Head CT negative. She was continued on Xarelto.  Admitted to [place] for suicidal ideation. She was then sent to [facility] for additional treatment. She has ongoing psychiatric follow up.   She presents today for follow up. Had repeat RHC in 10/20 which showed mild PAH (50/16/28) with high output (8.1l/min)  and normal PVR (2.0 WU). Not candidate for additional selective pulmonary artery vasodialtors Feeling better. SOB improved. Minimal edema. No orthopnea or PND. No wheezing. Working on diet with portion control and food journal. Going to bariatric program at [facility].   [place] 10/20 RA = 7 RV = 50/12 PA = 50/16 (28) PCW = 11 Fick cardiac output/index = 8.1/3.4 PVR = 2.0 WU FA sat = 99% PA sat = 72%, 77%  Assessment:  1. Mild PAH with high output and normal PVR 2. Normal PCWP  Echo 06/02/19 LVEF 65% RV dilated with normal function. Septum flat. 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 PA pressures could not be estimated. 04/2017: EF 55-60%  RV normal RVSP 46mmHG  RHC 08/26/2017 RA = 6 RV = 50/7 PA = 48/13 (30) PCW = 6 Fick cardiac output/index = 7.0/2.9 PVR = 3.1 WU Ao sat = 97% PA sat = 67%, 70% SVC sat = 70% Assessment: Mild PAH (on Adempas 1mg  tid) with high cardiac output. No evidence of intracardiac shunting.   10/2016 RHC RA = 8 RV = 64/13 PA = 65/19 (35) PCW = 9 Fick cardiac output/index = 7.5/3.1 PVR = 3.5 WU Ao sat = 96% PA sat = 68%,70% SVC sat = 71% Assessment: 1. Mild residual PAH in the setting of CTEPH s/p pulmonary thrombolectomy 2. Normal cardiac  output 3. Normal left-sided filling pressures  Past Medical History:  Diagnosis Date  . Anxiety   . Arthritis   . CHF exacerbation ([place]) 08/21/2017  . Depression   . Difficult intubation    See [facility] anesthesia records in [place]  . Diverticulitis   . Dyspnea    with exertion  . Family history of adverse reaction to anesthesia    " My mother stopped breathing during procedure"  . GERD (gastroesophageal reflux disease)    PMH; resolved  . Hyperlipidemia   . Hypertension   . Obesity   . Plantar fasciitis,  left   . Pneumonia   . Port-A-Cath in place   . Pre-diabetes   . Pulmonary embolism ([place])   . Pulmonary hypertension ([place])   . Sleep apnea    wears CPAP # 9  . Wears glasses     Past Surgical History:  Procedure Laterality Date  . ABDOMINAL HYSTERECTOMY    . BREAST REDUCTION SURGERY    . CHOLECYSTECTOMY    . COLONOSCOPY W/ BIOPSIES AND POLYPECTOMY    . EMBOLECTOMY N/A    pulmonary embolectomy  . FRACTURE SURGERY     right ankle  . GASTROCNEMIUS RECESSION Left 02/12/2019   Procedure: LEFT GASTROCNEMIUS RECESSION, PLANTAR FASCIA RELEASE;  Surgeon: Newt Minion, MD;  Location: [place];  Service: Orthopedics;  Laterality: Left;  . IVC FILTER INSERTION    . REDUCTION MAMMAPLASTY Bilateral   . RIGHT HEART CATH N/A 10/28/2016   Procedure: Right Heart Cath;  Surgeon: Jolaine Artist, MD;  Location: [place] [facility];  Service: Cardiovascular;  Laterality: N/A;  . RIGHT HEART CATH N/A 08/26/2017   Procedure: RIGHT HEART CATH;  Surgeon: Jolaine Artist, MD;  Location: [place] [facility];  Service: Cardiovascular;  Laterality: N/A;  . RIGHT HEART CATH N/A 07/05/2019   Procedure: RIGHT HEART CATH;  Surgeon: Jolaine Artist, MD;  Location: [place] [facility];  Service: Cardiovascular;  Laterality: N/A;  . ULTRASOUND GUIDANCE FOR VASCULAR ACCESS  08/26/2017   Procedure: Ultrasound Guidance For Vascular Access;  Surgeon: Jolaine Artist, MD;  Location: [place] [facility];  Service: Cardiovascular;;  . WISDOM TOOTH EXTRACTION      Current Medications:  Current Outpatient Medications on File Prior to Encounter  Medication Sig Dispense Refill  . acetaminophen-codeine (TYLENOL #4) 300-60 MG tablet Take 1 tablet by mouth every 4 (four) hours as needed for pain.    Marland Kitchen atorvastatin (LIPITOR) 20 MG tablet Take 1 tablet (20 mg total) by mouth daily at 6 PM. 90 tablet 1  . cyclobenzaprine (FLEXERIL) 5 MG tablet Take 5 mg by mouth 2 (two) times daily as needed for muscle spasms.    .  diclofenac sodium (VOLTAREN) 1 % GEL Apply 2 g topically 4 (four) times daily as needed (knee and back pain).    Marland Kitchen docusate sodium (COLACE) 100 MG capsule Take 1 capsule (100 mg total) by mouth every 12 (twelve) hours. 30 capsule 0  . fluticasone (FLONASE) 50 MCG/ACT nasal spray Place 2 sprays into both nostrils daily. 16 g 6  . furosemide (LASIX) 40 MG tablet Take 1 tablet (40 mg total) by mouth daily. Take extra 40 mg tablet once in the afternoon AS NEEDED for weight gain 3 lbs or more. 180 tablet 1  . gabapentin (NEURONTIN) 300 MG capsule Take 600 mg by mouth 3 (three) times daily.    Marland Kitchen LORazepam (ATIVAN) 0.5 MG tablet Take 0.5 mg by mouth  every 8 (eight) hours as needed for anxiety or sleep.    . meclizine (ANTIVERT) 25 MG tablet Take 25 mg by mouth 2 (two) times daily as needed for dizziness.    . Melatonin 5 MG TABS Take 5 mg by mouth at bedtime.    . mirtazapine (REMERON) 15 MG tablet Take 15 mg by mouth at bedtime. Total dosage is 60 mg    . mirtazapine (REMERON) 45 MG tablet Take 45 mg by mouth at bedtime. Total dosage is 60 mg    . polyethylene glycol powder (GLYCOLAX/MIRALAX) powder Take 17 g by mouth 2 (two) times daily as needed. 3350 g 1  . Riociguat (ADEMPAS) 2 MG TABS Take 2 mg by mouth daily.    . rivaroxaban (XARELTO) 20 MG TABS tablet Take 1 tablet (20 mg total) by mouth daily with supper. 30 tablet 5  . spironolactone (ALDACTONE) 25 MG tablet Take 12.5 mg by mouth every other day.    . traZODone (DESYREL) 100 MG tablet Take 100 mg by mouth at bedtime as needed for sleep.      No current facility-administered medications on file prior to encounter.    Allergies:   [name].   Social History   Socioeconomic History  . Marital status: Married    Spouse name: Not on file  . Number of children: Not on file  . Years of education: Not on file  . Highest education level: Not on file  Occupational History  . Not on file  Tobacco Use  . Smoking status:  Never Smoker  . Smokeless tobacco: Never Used  Substance and Sexual Activity  . Alcohol use: No  . Drug use: No  . Sexual activity: Yes    Partners: Male    Birth control/protection: Surgical  Other Topics Concern  . Not on file  Social History Narrative  . Not on file   Social Determinants of Health   Financial Resource Strain:   . Difficulty of Paying Living Expenses: Not on file  Food Insecurity:   . Worried About Charity fundraiser in the Last Year: Not on file  . Ran Out of Food in the Last Year: Not on file  Transportation Needs:   . Lack of Transportation (Medical): Not on file  . Lack of Transportation (Non-Medical): Not on file  Physical Activity:   . Days of Exercise per Week: Not on file  . Minutes of Exercise per Session: Not on file  Stress:   . Feeling of Stress : Not on file  Social Connections:   . Frequency of Communication with Friends and Family: Not on file  . Frequency of Social Gatherings with Friends and Family: Not on file  . Attends Religious Services: Not on file  . Active Member of Clubs or Organizations: Not on file  . Attends Archivist Meetings: Not on file  . Marital Status: Not on file    Family History:  The patient's family history includes Anxiety disorder in her cousin, sister, and sister; Bipolar disorder in her sister and sister; Dementia in her father and mother; Drug abuse in her sister and sister; Lung cancer in her maternal grandmother; Sexual abuse in her maternal aunt.     Review of systems complete and found to be negative unless listed in HPI.   PHYSICAL EXAM:    Vitals:   10/20/19 1501  BP: (!) 134/44  Pulse: 80  SpO2: 100%  Weight: (!) 153.5 kg (338  lb 6.4 oz)    Wt Readings from Last 3 Encounters:  10/20/19 (!) 153.5 kg (338 lb 6.4 oz)  07/05/19 (!) 152.4 kg (336 lb)  06/02/19 (!) 154 kg (339 lb 9.6 oz)    General:  Obese  No resp difficulty HEENT: normal Neck: supple. no JVD. Carotids 2+ bilat; no  bruits. No lymphadenopathy or thryomegaly appreciated. Cor: PMI nondisplaced. Regular rate & rhythm. No rubs, gallops or murmurs. Lungs: clear Abdomen: obese soft, nontender, nondistended. No hepatosplenomegaly. No bruits or masses. Good bowel sounds. Extremities: no cyanosis, clubbing, rash, trace edema Neuro: alert & orientedx3, cranial nerves grossly intact. moves all 4 extremities w/o difficulty. Affect pleasant   Studies/Labs Reviewed:   Recent Labs: 11/02/2018: Magnesium 1.9 09/14/2019: ALT 15; TSH 0.702 09/18/2019: B Natriuretic Peptide 25.7; BUN 12; Creatinine, Ser 0.94; Hemoglobin 12.4; Platelets 294; Potassium 3.9; Sodium 141   Lipid Panel    Component Value Date/Time   CHOL 113 11/02/2018 1013   TRIG 55 11/02/2018 1013   HDL 35 (L) 11/02/2018 1013   CHOLHDL 3.2 11/02/2018 1013   VLDL 11 11/02/2018 1013   LDLCALC 67 11/02/2018 1013    Additional studies/ records that were reviewed today include:   ECHO 8/18 EF 55-60%  RV normal RVSP 74mmHG   2D ECHO: 12/15/2015 LV EF: 55% -   60% Study Conclusions - Left ventricle: The cavity size was normal. Wall thickness was normal. Systolic function was normal. The estimated ejection  fraction was in the range of 55% to 60%. - Pulmonary arteries: PA peak pressure: 70 mm Hg (S). - Pericardium, extracardiac: A trivial pericardial effusion was   identified.   2D ECHO 04/05/16 INTERPRETATION:NORMAL LEFT VENTRICULAR FUNCTION WITH MILD LVHSEVERE RV SYSTOLIC DYSFUNCTION (See above) VALVULAR REGURGITATION: TRIVIAL PR, MILD TR NO VALVULAR STENOSIS TRIVIAL PERICARDIAL EFFUSION Compared with prior Echo study on 03/14/2016: RVSP increased from 50 to 69   CATH right heart and coronary angiography: 03/25/2016 [place] Component Name Value Ref Range  Cardiac Index (l/min/m2) 2.3 L/min/m2  Right Atrium Mean Pressure (mmHg) 11 mmHg   Right Ventricle Systolic Pressure (mmHg) 68 mmHg   Pulmonary Artery Mean Pressure  (mmHg) 40 mmHg   Pulmonary Wedge Pressure (mmHg) 14 mmHg   Pulmonary Vascular Resistance (Wood units) 4.7    CT angio 05/15/16 IMPRESSION: Persistent but partially recanalized pulmonary embolus within the right lower lobe pulmonary artery. No new pulmonary embolism is identified. Minimal bibasilar atelectatic changes stable from the prior exam.   V/Q scan 06/27/16 IMPRESSION: Prominent ventilation perfusion mismatch noted on the right. Finding suggests high probability right-sided pulmonary embolus in this patient with known right-sided pulmonary embolus.   ASSESSMENT & PLAN:    1. 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 No bleeding  - RHC 12/18 with very mild PAH and PVR 3.1 WU - Echo 10/19 shows EF 55-60% RV not well seen but looks ok No septal flattening  - Echo 9/20 LVEF 65% RV dilated with normal function. Septum flat. Personally reviewed - RHC 10/20 mild PAH (50/16/28) with high output (8.1l/min) and normal PVR (2.0 WU). Not candidate for additional selective pulmonary artery vasodilators. D/w her today - Stable NYHA III in setting of severe obesity - Continue adempas at 2mg  tid. - Volume status looks good. Continue lasix 40 mg daily. Take extra as needed  2. Chronic chest pain:  - Has chronic PE s/p thrombectomy, LHC performed at [facility] 7/17 with no CAD.  - Much improved   3. Fibromyalgia/Rheumatoid Arthitis:  - Follows with Rheumatology. No change.    4. Morbid obesity with OHS:  - Body mass index is 59.94 kg/m.  - Encouraged weight loss. Continue to follow at [facility]  5. OSA - Encouraged nightly CPAP use  6. Depression/anxiety - Receiving outpatient therapy.   - Per PCP - Doing well today    Glori Bickers, MD  3:10 PM

## 2019-10-20 NOTE — Addendum Note (Signed)
Encounter addended by: Kerry Dory, CMA on: 10/20/2019 3:29 PM  Actions taken: Clinical Note Signed

## 2019-10-21 NOTE — Telephone Encounter (Signed)
[name] [name]  1. Would like MRI right knee. Feels like something is torn. Okay to submit for MRI?   2. She would like to know How many lbs she needs to loose in order for her to have SU?     [name] H. Please submit for right knee gel inj. - Dr Rosine Door

## 2019-10-22 ENCOUNTER — Other Ambulatory Visit: Payer: Self-pay

## 2019-10-22 ENCOUNTER — Telehealth (HOSPITAL_COMMUNITY): Payer: Self-pay | Admitting: Pharmacist

## 2019-10-22 ENCOUNTER — Telehealth: Payer: Self-pay

## 2019-10-22 DIAGNOSIS — M1711 Unilateral primary osteoarthritis, right knee: Secondary | ICD-10-CM

## 2019-10-22 NOTE — Telephone Encounter (Signed)
53

## 2019-10-22 NOTE — Telephone Encounter (Signed)
Called patient to advise. She is aware. Also MRI was made today and is aware  someone will contact her to schedule.

## 2019-10-22 NOTE — Telephone Encounter (Signed)
Submitted for VOB for Monovisc-Right knee 

## 2019-10-22 NOTE — Telephone Encounter (Signed)
Needs to get to 225 pounds

## 2019-10-22 NOTE — Telephone Encounter (Signed)
Advanced Heart Failure Patient Advocate Encounter  Prior Authorization for Adempas has been approved.    Effective dates: 10/22/19 through 09/08/20  Audry Riles, PharmD, BCPS, BCCP, CPP Heart Failure Clinic Pharmacist [phone]

## 2019-10-22 NOTE — Telephone Encounter (Signed)
Patient Advocate Encounter   Received notification from [facility] that prior authorization for Adempas is required.   PA submitted via phone Key [id number] Status is pending   Will continue to follow.  Audry Riles, PharmD, BCPS, BCCP, CPP Heart Failure Clinic Pharmacist [phone]

## 2019-10-22 NOTE — Telephone Encounter (Signed)
Approved for Monovisc-Right knee Dr. Frederik Pear and Bill $25 copay 20% OOP Prior auth required with [employer] Cohere # [id number] Dates: 10/22/19-04/20/20

## 2019-10-22 NOTE — Telephone Encounter (Signed)
Ok to get MRI then.  I cannot find her height to calculate.  Can you find and let me know?

## 2019-10-25 NOTE — Telephone Encounter (Signed)
Scheduled appointment for wed @ 1:15

## 2019-10-27 ENCOUNTER — Telehealth: Payer: Self-pay

## 2019-10-27 ENCOUNTER — Other Ambulatory Visit: Payer: Self-pay

## 2019-10-27 ENCOUNTER — Ambulatory Visit (INDEPENDENT_AMBULATORY_CARE_PROVIDER_SITE_OTHER): Payer: Medicare HMO | Admitting: Orthopaedic Surgery

## 2019-10-27 VITALS — Ht 63.0 in | Wt 333.0 lb

## 2019-10-27 DIAGNOSIS — M1712 Unilateral primary osteoarthritis, left knee: Secondary | ICD-10-CM

## 2019-10-27 DIAGNOSIS — M1711 Unilateral primary osteoarthritis, right knee: Secondary | ICD-10-CM | POA: Diagnosis not present

## 2019-10-27 MED ORDER — METHYLPREDNISOLONE ACETATE 40 MG/ML IJ SUSP
40.0000 mg | INTRAMUSCULAR | Status: AC | PRN
  Administered 2019-10-27: 14:00:00 40 mg via INTRA_ARTICULAR

## 2019-10-27 MED ORDER — LIDOCAINE HCL 1 % IJ SOLN
2.0000 mL | INTRAMUSCULAR | Status: AC | PRN
  Administered 2019-10-27: 14:00:00 2 mL

## 2019-10-27 MED ORDER — METHOCARBAMOL 500 MG PO TABS: 500.0000 mg | ORAL_TABLET | Freq: Two times a day (BID) | ORAL | 0 refills | Status: DC | PRN

## 2019-10-27 MED ORDER — BUPIVACAINE HCL 0.25 % IJ SOLN
2.0000 mL | INTRAMUSCULAR | Status: AC | PRN
  Administered 2019-10-27: 14:00:00 2 mL via INTRA_ARTICULAR

## 2019-10-27 MED ORDER — HYALURONAN 88 MG/4ML IX SOSY
88.0000 mg | PREFILLED_SYRINGE | INTRA_ARTICULAR | Status: AC | PRN
  Administered 2019-10-27: 88 mg via INTRA_ARTICULAR

## 2019-10-27 MED ORDER — LIDOCAINE HCL 1 % IJ SOLN
2.0000 mL | INTRAMUSCULAR | Status: AC | PRN
  Administered 2019-10-27: 2 mL

## 2019-10-27 MED ORDER — TRAMADOL HCL 50 MG PO TABS: ORAL_TABLET | ORAL | 1 refills | Status: DC

## 2019-10-27 NOTE — Telephone Encounter (Signed)
[name] spoke to patient today at her visit

## 2019-10-27 NOTE — Progress Notes (Signed)
Office Visit Note   Patient: [name] [name]           Date of Birth: 06/18/1961           MRN: [id number] Visit Date: 10/27/2019              Requested by: Ruthine Dose, [place],  [place] [address] PCP: Ruthine Dose, MD   Assessment & Plan: Visit Diagnoses:  1. Unilateral primary osteoarthritis, right knee     Plan: Impression is left knee advanced degenerative joint disease.  We injected the left knee with cortisone today.  We will submit approval for viscosupplementation injection to the left knee at this point.  Follow-Up Instructions: Return if symptoms worsen or fail to improve.   Orders:  Orders Placed This Encounter  Procedures  . Large Joint Inj: R knee  . Large Joint Inj: L knee   Meds ordered this encounter  Medications  . traMADol (ULTRAM) 50 MG tablet    Sig: Take 1-2 tabs po bid prn pain    Dispense:  30 tablet    Refill:  1  . methocarbamol (ROBAXIN) 500 MG tablet    Sig: Take 1 tablet (500 mg total) by mouth 2 (two) times daily as needed.    Dispense:  20 tablet    Refill:  0      Procedures: Large Joint Inj: L knee on 10/27/2019 1:57 PM Indications: pain Details: 22 G needle, anterolateral approach Medications: 2 mL lidocaine 1 %; 2 mL bupivacaine 0.25 %; 40 mg methylPREDNISolone acetate 40 MG/ML      Clinical Data: No additional findings.   Subjective: Chief Complaint  Patient presents with  . Right Knee - Pain    Monovisc Injection Right Knee  . Left Knee - Pain    HPI patient is a pleasant 59 year old female who comes in today with continued left knee pain.  History of advanced generative changes.  She was seen by [place] in September 2019 for this.  Left knee cortisone injection performed which did seem to help alleviate her symptoms.  Her pain is returned and is aggravated with activity.  She does wake up at night from this pain as well.  No previous viscosupplementation injection to the left knee.  Review of  Systems as detailed in HPI.  All others reviewed and are negative   Objective: Vital Signs: Ht 5\' 3"  (1.6 m)   Wt (!) 333 lb (151 kg)   BMI 58.99 kg/m   Physical Exam well developed and well nourished female in no acute distress.  Alert and oriented x 3.    Ortho Exam examination of her left knee reveals exogenous obesity.  Range of motion 0 to 90 degrees.  Medial lateral joint line tenderness.  She is neurovascular intact distally.  Specialty Comments:  No specialty comments available.  Imaging: No new imaging   PMFS History: Patient Active Problem List   Diagnosis Date Noted  . Primary osteoarthritis of right knee 09/30/2019  . Achilles tendon contracture, left   . Leg pain, left 07/10/2018  . Bilateral primary osteoarthritis of knee 05/12/2018  . Plantar fasciitis of left foot 05/12/2018  . Shortness of breath 01/16/2018  . Palliative care encounter 01/16/2018  . Major depressive disorder, recurrent severe without psychotic features ([place]) 12/06/2017  . CHF exacerbation ([place]) 08/21/2017  . (HFpEF) heart failure with preserved ejection fraction ([place]) 08/20/2017  . Lumbar radiculopathy 08/08/2017  . Body mass index 50.0-59.9, adult ([place]) 08/08/2017  .  Hoarseness 08/08/2017  . Chronic cough 04/10/2017  . Rheumatoid arthritis involving multiple sites ([place]) 04/10/2017  . Anxiety and depression 04/10/2017  . Adjustment disorder with depressed mood 12/11/2016  . Morbid obesity ([place]) 08/14/2016  . Stroke ([place]) 06/27/2016  . Right sided weakness 06/27/2016  . Essential hypertension 06/27/2016  . Atypical chest pain 06/27/2016  . Difficult airway 03/29/2016  . CTEPH (chronic thromboembolic pulmonary hypertension) ([place]) 03/14/2016  . OSA on CPAP 02/20/2016  . Chronic pain syndrome 01/31/2016  . Pulmonary embolus ([place]) 11/15/2015  . Vertigo 11/15/2015  . Depression 11/15/2015   Past Medical History:  Diagnosis Date  . Anxiety   . Arthritis   . CHF exacerbation ([place])  08/21/2017  . Depression   . Difficult intubation    See [facility] anesthesia records in [place]  . Diverticulitis   . Dyspnea    with exertion  . Family history of adverse reaction to anesthesia    " My mother stopped breathing during procedure"  . GERD (gastroesophageal reflux disease)    PMH; resolved  . Hyperlipidemia   . Hypertension   . Obesity   . Plantar fasciitis, left   . Pneumonia   . Port-A-Cath in place   . Pre-diabetes   . Pulmonary embolism ([place])   . Pulmonary hypertension ([place])   . Sleep apnea    wears CPAP # 9  . Wears glasses     Family History  Problem Relation Age of Onset  . Lung cancer Maternal Grandmother   . Dementia Mother   . Dementia Father   . Anxiety disorder Sister   . Bipolar disorder Sister   . Drug abuse Sister   . Sexual abuse Maternal Aunt   . Anxiety disorder Cousin   . Anxiety disorder Sister   . Bipolar disorder Sister   . Drug abuse Sister   . Breast cancer Neg Hx     Past Surgical History:  Procedure Laterality Date  . ABDOMINAL HYSTERECTOMY    . BREAST REDUCTION SURGERY    . CHOLECYSTECTOMY    . COLONOSCOPY W/ BIOPSIES AND POLYPECTOMY    . EMBOLECTOMY N/A    pulmonary embolectomy  . FRACTURE SURGERY     right ankle  . GASTROCNEMIUS RECESSION Left 02/12/2019   Procedure: LEFT GASTROCNEMIUS RECESSION, PLANTAR FASCIA RELEASE;  Surgeon: Newt Minion, MD;  Location: [place];  Service: Orthopedics;  Laterality: Left;  . IVC FILTER INSERTION    . REDUCTION MAMMAPLASTY Bilateral   . RIGHT HEART CATH N/A 10/28/2016   Procedure: Right Heart Cath;  Surgeon: Jolaine Artist, MD;  Location: [place] [facility];  Service: Cardiovascular;  Laterality: N/A;  . RIGHT HEART CATH N/A 08/26/2017   Procedure: RIGHT HEART CATH;  Surgeon: Jolaine Artist, MD;  Location: [place] [facility];  Service: Cardiovascular;  Laterality: N/A;  . RIGHT HEART CATH N/A 07/05/2019   Procedure: RIGHT HEART CATH;  Surgeon: Jolaine Artist, MD;   Location: [place] [facility];  Service: Cardiovascular;  Laterality: N/A;  . ULTRASOUND GUIDANCE FOR VASCULAR ACCESS  08/26/2017   Procedure: Ultrasound Guidance For Vascular Access;  Surgeon: Jolaine Artist, MD;  Location: [place] [facility];  Service: Cardiovascular;;  . WISDOM TOOTH EXTRACTION     Social History   Occupational History  . Not on file  Tobacco Use  . Smoking status: Never Smoker  . Smokeless tobacco: Never Used  Substance and Sexual Activity  . Alcohol use: No  . Drug use:  No  . Sexual activity: Yes    Partners: Male    Birth control/protection: Surgical

## 2019-10-27 NOTE — Telephone Encounter (Signed)
Submitted for Monovisc-left knee

## 2019-10-27 NOTE — Progress Notes (Signed)
   Procedure Note  Patient: [name] [name]             Date of Birth: [birth year]           MRN: [id number]             Visit Date: 10/27/2019  Procedures: Visit Diagnoses:  1. Unilateral primary osteoarthritis, right knee     Large Joint Inj: R knee on 10/27/2019 1:40 PM Indications: pain Details: 22 G needle, anterolateral approach Medications: 2 mL lidocaine 1 %; 2 mL bupivacaine 0.25 %; 88 mg Hyaluronan 88 MG/4ML

## 2019-10-27 NOTE — Telephone Encounter (Signed)
Per Mendel Ryder Cx MRI please. Thank you.

## 2019-10-27 NOTE — Telephone Encounter (Signed)
Please submit for left knee gel injection 

## 2019-10-27 NOTE — Telephone Encounter (Signed)
Order cancelled, does pt know we are cancelling?

## 2019-10-29 ENCOUNTER — Telehealth: Payer: Self-pay

## 2019-10-29 NOTE — Telephone Encounter (Signed)
Approved for Monovisc-left knee Dr. Frederik Pear and Bill $25 copay 20% OOP Auth required with [employer] Auth # [id number] Dates 10/29/19-04/27/20

## 2019-10-29 NOTE — Telephone Encounter (Signed)
Scheduled appointment for 11/03/19

## 2019-11-03 ENCOUNTER — Other Ambulatory Visit: Payer: Self-pay

## 2019-11-03 ENCOUNTER — Encounter: Payer: Self-pay | Admitting: Orthopaedic Surgery

## 2019-11-03 ENCOUNTER — Ambulatory Visit: Payer: Medicare HMO | Admitting: Orthopaedic Surgery

## 2019-11-03 DIAGNOSIS — M1712 Unilateral primary osteoarthritis, left knee: Secondary | ICD-10-CM

## 2019-11-03 MED ORDER — BUPIVACAINE HCL 0.25 % IJ SOLN
2.0000 mL | INTRAMUSCULAR | Status: AC | PRN
  Administered 2019-11-03: 15:00:00 2 mL via INTRA_ARTICULAR

## 2019-11-03 MED ORDER — LIDOCAINE HCL 1 % IJ SOLN
2.0000 mL | INTRAMUSCULAR | Status: AC | PRN
  Administered 2019-11-03: 15:00:00 2 mL

## 2019-11-03 MED ORDER — HYALURONAN 88 MG/4ML IX SOSY
88.0000 mg | PREFILLED_SYRINGE | INTRA_ARTICULAR | Status: AC | PRN
  Administered 2019-11-03: 15:00:00 88 mg via INTRA_ARTICULAR

## 2019-11-03 NOTE — Progress Notes (Signed)
   Procedure Note  Patient: [name] [name]             Date of Birth: December 09, 1960           MRN: [id number]             Visit Date: 11/03/2019  Procedures: Visit Diagnoses:  1. Unilateral primary osteoarthritis, left knee     Large Joint Inj: L knee on 11/03/2019 2:55 PM Indications: pain Details: 22 G needle, anterolateral approach Medications: 2 mL lidocaine 1 %; 2 mL bupivacaine 0.25 %; 88 mg Hyaluronan 88 MG/4ML

## 2019-11-04 ENCOUNTER — Encounter (HOSPITAL_COMMUNITY): Payer: Medicare HMO

## 2019-11-11 ENCOUNTER — Ambulatory Visit (HOSPITAL_COMMUNITY)
Admission: RE | Admit: 2019-11-11 | Discharge: 2019-11-11 | Disposition: A | Discharge status: Home / Self Care | Payer: Medicare HMO | Source: Ambulatory Visit | Attending: Internal Medicine | Admitting: Internal Medicine

## 2019-11-11 ENCOUNTER — Other Ambulatory Visit: Payer: Self-pay

## 2019-11-11 DIAGNOSIS — Z452 Encounter for adjustment and management of vascular access device: Secondary | ICD-10-CM | POA: Insufficient documentation

## 2019-11-11 MED ORDER — HEPARIN SOD (PORK) LOCK FLUSH 100 UNIT/ML IV SOLN
500.0000 [IU] | INTRAVENOUS | Status: AC | PRN
  Administered 2019-11-11: 500 [IU]

## 2019-11-11 MED ORDER — SODIUM CHLORIDE 0.9% FLUSH
10.0000 mL | INTRAVENOUS | Status: AC | PRN
  Administered 2019-11-11: 10 mL

## 2019-11-11 NOTE — Progress Notes (Signed)
PATIENT CARE CENTER NOTE    Provider:Johnson, Neoma Laming, MD   Procedure:Port-a-cath flush   Note:Port-a-cath accessed and flushed using sterile technique. Patient tolerated well. PAC deaccessed. Discharge instructions given. Patient alert, oriented and ambulatory at discharge

## 2019-11-22 ENCOUNTER — Other Ambulatory Visit: Payer: Self-pay | Admitting: Physician Assistant

## 2019-11-23 ENCOUNTER — Other Ambulatory Visit: Payer: Self-pay | Admitting: Pulmonary Disease

## 2019-11-23 NOTE — Telephone Encounter (Signed)
Called patient and advised her that she will need an appointment for further refills. She has been scheduled for April 20th at 12pm. Refill has been sent in. She verbalized understanding. Nothing further needed at time of call.

## 2019-11-23 NOTE — Telephone Encounter (Signed)
PT calling back--needs refill of blood thinner, pharmacy advised to call us

## 2019-11-25 ENCOUNTER — Ambulatory Visit: Payer: Medicare HMO | Attending: Internal Medicine

## 2019-11-25 DIAGNOSIS — Z23 Encounter for immunization: Secondary | ICD-10-CM

## 2019-11-25 NOTE — Progress Notes (Addendum)
   Covid-19 Vaccination Clinic  Name:  [name] [name]    MRN: [id number] DOB: 29-Jun-1961  11/25/2019  [name] was observed post Covid-19 immunization for 15 minutes without incident. She was provided with Vaccine Information Sheet and instruction to access the V-Safe system.   [name] was instructed to call 911 with any severe reactions post vaccine: Marland Kitchen Difficulty breathing  . Swelling of face and throat  . A fast heartbeat  . A bad rash all over body  . Dizziness and weakness   Immunizations Administered    Name Date Dose VIS Date Route   Pfizer COVID-19 Vaccine 11/25/2019  2:48 PM 0.3 mL 08/20/2019 Intramuscular   Manufacturer: [place]   Lot: [id number]   [place]: [id number]

## 2019-12-09 ENCOUNTER — Telehealth: Payer: Self-pay | Admitting: Orthopaedic Surgery

## 2019-12-09 NOTE — Telephone Encounter (Signed)
Pt called wanting to know if she would be able to get the Cortizone shot and if so she would like to know how soon would she be able to get it?  [phone]

## 2019-12-09 NOTE — Telephone Encounter (Signed)
Would be best to wait until mid may since that will be halfway mark between gel shots

## 2019-12-14 ENCOUNTER — Other Ambulatory Visit: Payer: Self-pay | Admitting: Physician Assistant

## 2019-12-14 MED ORDER — METHOCARBAMOL 500 MG PO TABS: 500.0000 mg | ORAL_TABLET | Freq: Two times a day (BID) | ORAL | 0 refills | Status: DC | PRN

## 2019-12-14 NOTE — Telephone Encounter (Signed)
Sent in

## 2019-12-14 NOTE — Telephone Encounter (Signed)
Patient aware.

## 2019-12-14 NOTE — Telephone Encounter (Signed)
Would like the same muscle relaxers called into pharm. Does not remember name of Rx.  [place].

## 2019-12-16 ENCOUNTER — Encounter (HOSPITAL_COMMUNITY): Payer: Medicare HMO

## 2019-12-20 ENCOUNTER — Ambulatory Visit: Payer: Self-pay

## 2019-12-22 ENCOUNTER — Ambulatory Visit: Payer: Medicare HMO | Attending: Internal Medicine

## 2019-12-22 DIAGNOSIS — Z23 Encounter for immunization: Secondary | ICD-10-CM

## 2019-12-22 NOTE — Progress Notes (Signed)
   Covid-19 Vaccination Clinic  Name:  [name] [name]    MRN: [id number] DOB: 1961-01-10  12/22/2019  [name] [name] was observed post Covid-19 immunization for 30 minutes based on pre-vaccination screening without incident. She was provided with Vaccine Information Sheet and instruction to access the V-Safe system.   [name] [name] was instructed to call 911 with any severe reactions post vaccine: Marland Kitchen Difficulty breathing  . Swelling of face and throat  . A fast heartbeat  . A bad rash all over body  . Dizziness and weakness   Immunizations Administered    Name Date Dose VIS Date Route   Pfizer COVID-19 Vaccine 12/22/2019 10:13 AM 0.3 mL 08/20/2019 Intramuscular   Manufacturer: [place]   Lot: [id number]   [place]: [id number]

## 2019-12-23 ENCOUNTER — Encounter (HOSPITAL_COMMUNITY): Payer: Medicare HMO

## 2019-12-28 ENCOUNTER — Ambulatory Visit: Payer: Medicare HMO | Admitting: Pulmonary Disease

## 2019-12-30 ENCOUNTER — Ambulatory Visit (HOSPITAL_COMMUNITY)
Admission: RE | Admit: 2019-12-30 | Discharge: 2019-12-30 | Disposition: A | Discharge status: Home / Self Care | Payer: Medicare HMO | Source: Ambulatory Visit | Attending: Internal Medicine | Admitting: Internal Medicine

## 2019-12-30 ENCOUNTER — Other Ambulatory Visit: Payer: Self-pay

## 2019-12-30 DIAGNOSIS — E785 Hyperlipidemia, unspecified: Secondary | ICD-10-CM | POA: Diagnosis not present

## 2019-12-30 DIAGNOSIS — I509 Heart failure, unspecified: Secondary | ICD-10-CM | POA: Insufficient documentation

## 2019-12-30 DIAGNOSIS — Z452 Encounter for adjustment and management of vascular access device: Secondary | ICD-10-CM | POA: Diagnosis not present

## 2019-12-30 LAB — LIPID PANEL
Cholesterol: 110 mg/dL (ref 0–200)
HDL: 36 mg/dL — ABNORMAL LOW (ref >40)
LDL Cholesterol: 64 mg/dL (ref 0–99)
Total CHOL/HDL Ratio: 3.1 RATIO
Triglycerides: 51 mg/dL (ref <150)
VLDL: 10 mg/dL (ref 0–40)

## 2019-12-30 LAB — TSH: TSH: 0.598 u[IU]/mL (ref 0.350–4.500)

## 2019-12-30 LAB — T4, FREE: Free T4: 0.91 ng/dL (ref 0.61–1.12)

## 2019-12-30 MED ORDER — HEPARIN SOD (PORK) LOCK FLUSH 100 UNIT/ML IV SOLN
500.0000 [IU] | INTRAVENOUS | Status: AC | PRN
  Administered 2019-12-30: 500 [IU]

## 2019-12-30 MED ORDER — SODIUM CHLORIDE 0.9% FLUSH
10.0000 mL | INTRAVENOUS | Status: AC | PRN
  Administered 2019-12-30: 10 mL

## 2019-12-30 NOTE — Progress Notes (Addendum)
PATIENT CARE CENTER NOTE    Provider:Johnson, Neoma Laming, MD   Procedure:Port-a-cath flush and lab draw   Note:Port-a-cath accessed and flushed using sterile technique.  Labs drawn from Port (Lipids, TSH, T3, T4) per Deneda Day, FNP orders. Patient tolerated well.PAC deaccessed.Discharge instructions given. Patient alert, oriented and ambulatory at discharge

## 2019-12-31 LAB — T3: T3, Total: 119 ng/dL (ref 71–180)

## 2020-01-17 ENCOUNTER — Ambulatory Visit: Payer: Medicare HMO | Admitting: Pulmonary Disease

## 2020-01-17 ENCOUNTER — Other Ambulatory Visit: Payer: Self-pay

## 2020-01-17 ENCOUNTER — Encounter: Payer: Self-pay | Admitting: Pulmonary Disease

## 2020-01-17 VITALS — BP 130/86 | HR 140 | Temp 98.2°F | Ht 63.0 in | Wt 335.0 lb

## 2020-01-17 DIAGNOSIS — G4733 Obstructive sleep apnea (adult) (pediatric): Secondary | ICD-10-CM

## 2020-01-17 NOTE — Patient Instructions (Signed)
Thank you for following up today and I am glad you are doing well. Continue your Xarelto and Riociugat. We will put an order in for your CPAP equipment.   Follow up in

## 2020-01-17 NOTE — Progress Notes (Signed)
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 She was admitted in January 2017 at [place], [place] for saddle embolus. She was initially on heparin anticoagulation and transition to Xarelto. Since discharge has been complaining of dyspnea, cough with white sputum production, episodes of dizziness, right chest pain. She was reevaluated with a CT scan which showed persistent emboli and an echocardiogram which showed severe pulmonary hypertension. S/p thromboendarterectomy at [facility] in 2017. She reports that the anesthetists had a very hard time intubating her. It took a long time to secure the airway. Post procedure she seems to be stable. She was revaluated in ED for persistent chest pain in Sept 2017. She has a CTA done which shows partial recanalization of the thrombus.   She has history of obstructive sleep apnea diagnosed in 2015. She had been maintained on CPAP.  8/11/202:Follows with Dr. Haroldine Laws for pulmonary hypertension and is on Riociguat.  Dose was reduced to 1.5 mg 3 times a day in 2019 due to low blood pressure.  She continues on Xarelto anticoagulation  On CPAP for sleep apnea.  Compliant with therapy.  Interim history:  She sometimes get shortness of breath , but has days with no symptoms.  Joined [employer] and has started water aerobics   Outpatient Encounter Medications as of 01/17/2020  Medication Sig  . acetaminophen-codeine (TYLENOL #4) 300-60 MG tablet Take 1 tablet by mouth every 4 (four) hours as needed for pain.  Marland Kitchen atorvastatin (LIPITOR) 20 MG tablet Take 1 tablet (20 mg  total) by mouth daily at 6 PM.  . cyclobenzaprine (FLEXERIL) 5 MG tablet Take 5 mg by mouth 2 (two) times daily as needed for muscle spasms.  . diclofenac sodium (VOLTAREN) 1 % GEL Apply 2 g topically 4 (four) times daily as needed (knee and back pain).  Marland Kitchen docusate sodium (COLACE) 100 MG capsule Take 1 capsule (100 mg total) by mouth every 12 (twelve) hours.  . fluticasone (FLONASE) 50 MCG/ACT nasal spray Place 2 sprays into both nostrils daily.  . furosemide (LASIX) 40 MG tablet Take 1 tablet (40 mg total) by mouth daily. Take extra 40 mg tablet once in the afternoon AS NEEDED for weight gain 3 lbs or more.  . gabapentin (NEURONTIN) 300 MG capsule Take 600 mg by mouth 3 (three) times daily.  Marland Kitchen LORazepam (ATIVAN) 0.5 MG tablet Take 0.5 mg by mouth every 8 (eight) hours as needed for anxiety or sleep.  . meclizine (ANTIVERT) 25 MG tablet Take 25 mg by mouth 2 (two) times daily as needed for dizziness.  . Melatonin 5 MG TABS Take 5 mg by mouth at bedtime.  . methocarbamol (ROBAXIN) 500 MG tablet Take 1 tablet (500 mg total) by mouth 2 (two) times daily as needed.  . mirtazapine (REMERON) 15 MG tablet Take 15 mg by mouth at bedtime. Total dosage is 60 mg  . mirtazapine (REMERON) 45 MG tablet Take 45 mg by mouth at bedtime. Total dosage is 60 mg  . polyethylene glycol powder (GLYCOLAX/MIRALAX) powder Take 17 g by mouth 2 (two) times daily as needed.  . Riociguat (ADEMPAS)  2 MG TABS Take 2 mg by mouth daily.  Marland Kitchen spironolactone (ALDACTONE) 25 MG tablet Take 12.5 mg by mouth every other day.  . traMADol (ULTRAM) 50 MG tablet Take 1-2 tabs po bid prn pain  . traZODone (DESYREL) 100 MG tablet Take 100 mg by mouth at bedtime as needed for sleep.   Alveda Reasons 20 MG TABS tablet TAKE 1 TABLET BY MOUTH ONCE DAILY WITH SUPPER   No facility-administered encounter medications on file as of 01/17/2020.  .surg Physical Exam: Blood pressure 104/62, pulse 63, height 5\' 3"  (1.6 m), weight (!) 309 lb 3.2 oz (140.3 kg),  SpO2 98 %. Gen:      No acute distress HEENT:  EOMI, sclera anicteric Neck:     No masses; no thyromegaly Lungs:    Clear to auscultation bilaterally; normal respiratory effort CV:         Regular rate and rhythm; no murmurs Abd:      + bowel sounds; soft, non-tender; no palpable masses, no distension Ext:    No edema; adequate peripheral perfusion Skin:      Warm and dry; no rash Neuro: alert and oriented x 3 Psych: normal mood and affect  Data Reviewed: Imaging: CT scan 12/25/15 Right lower lobe and left lower lobe pulmonary embolism. Images reviewed  CT scan 02/08/16 Persistent RLL embolus  CT scan 05/15/16 Persistent but partially recanalized pulmonary embolus within the right lower lobe pulmonary artery.  CTA 09/19/2017- no acute pulmonary embolism.  Evidence for chronic PE in the right lower lobe.  Bibasilar atelectasis  CT chest 09/26/2017-bibasilar atelectasis I reviewed the images personally.  Cardiac: Echo 07/01/2018- of EF 0000000, grade 1 diastolic dysfunction, RV pressure overload with dilatation, hypokinesis, PA peak pressure 62  Echo 06/02/2019-EF 60 to 65% , RV with hypokinesis  RHC 08/2017 RA = 6 RV = 50/7 PA = 48/13 (30) PCW = 6 Fick cardiac output/index = 7.0/2.9 PVR = 3.1 WU Ao sat = 97% PA sat = 67%, 70% SVC sat = 70%  RHC 10/2016  RA = 8 RV = 64/13 PA = 65/19 (35) PCW = 9 Fick cardiac output/index = 7.5/3.1 PVR = 3.5 WU Ao sat = 96% PA sat = 68%,70% SVC sat = 71%  RHC 07/05/2019 RA = 7 RV = 50/12 PA = 50/16 (28) PCW = 11 Fick cardiac output/index = 8.1/3.4 PVR = 2.0 WU FA sat = 99% PA sat = 72%, 77%   Labs 01/10/16 HIV- Neg LFTs- WNL Jo-1, centromere, dsDNA, RNP, Ro, La, RA, Scl-70- All negative  Assessment:  Chronic thromboembolic PHTN S/p thromboendarterectomy at [facility]. Clinically she is doing well on most days. She continues on anticoagulation and Riociguat  IVC filter placed on 03/25/2016 Follow with Dr. Haroldine Laws  Severe  OSA Compliant with CPAP. Needs new tubing.   Plan/Recommendations: - Continue Xarelto, Riociugat - DME for CPAP equipment  - Follow up in one year  Tamsen Snider, MD PGY1   Attending note: I have seen and examined the patient. History, labs and imaging reviewed. Agree with assessment and plan  Chronic thromboembolic pulmonary hypertension status post endarterectomy Continue Xarelto, Riociugat OSA is stable on BiPAP.  Will send DME order to replace hose and mask.  [name] MD [facility] 01/17/2020, 4:20 PM  CC: Ruthine Dose, MD

## 2020-01-27 ENCOUNTER — Encounter (HOSPITAL_COMMUNITY): Payer: Medicare HMO

## 2020-02-03 ENCOUNTER — Encounter (HOSPITAL_COMMUNITY): Payer: Medicare HMO

## 2020-02-17 ENCOUNTER — Other Ambulatory Visit: Payer: Self-pay

## 2020-02-17 ENCOUNTER — Ambulatory Visit (HOSPITAL_COMMUNITY)
Admission: RE | Admit: 2020-02-17 | Discharge: 2020-02-17 | Disposition: A | Discharge status: Home / Self Care | Payer: Medicare HMO | Source: Ambulatory Visit | Attending: Internal Medicine | Admitting: Internal Medicine

## 2020-02-17 DIAGNOSIS — Z452 Encounter for adjustment and management of vascular access device: Secondary | ICD-10-CM | POA: Diagnosis not present

## 2020-02-17 MED ORDER — SODIUM CHLORIDE 0.9% FLUSH
10.0000 mL | INTRAVENOUS | Status: AC | PRN
  Administered 2020-02-17: 10 mL

## 2020-02-17 MED ORDER — HEPARIN SOD (PORK) LOCK FLUSH 100 UNIT/ML IV SOLN
500.0000 [IU] | INTRAVENOUS | Status: AC | PRN
  Administered 2020-02-17: 500 [IU]
  Filled 2020-02-17: qty 5

## 2020-02-17 NOTE — Progress Notes (Signed)
PATIENT CARE CENTER NOTE  Provider: Karle Plumber MD   Procedure:Port-a-cath flush    Note: Port-a-cath accessed and flushed using sterile technique. Patient tolerated well. PAC de-accessed. Discharge instructions given. Patient verbalized understanding. Patient alert and ambulatory at discharge.

## 2020-02-20 ENCOUNTER — Other Ambulatory Visit: Payer: Self-pay | Admitting: Internal Medicine

## 2020-02-24 ENCOUNTER — Encounter (HOSPITAL_COMMUNITY): Payer: Self-pay

## 2020-02-24 ENCOUNTER — Other Ambulatory Visit: Payer: Self-pay

## 2020-02-24 ENCOUNTER — Inpatient Hospital Stay (HOSPITAL_COMMUNITY)
Admission: EM | Admit: 2020-02-24 | Discharge: 2020-02-28 | DRG: 378 | Disposition: A | Discharge status: Home / Self Care | Payer: Medicare HMO | Attending: Internal Medicine | Admitting: Internal Medicine

## 2020-02-24 DIAGNOSIS — M722 Plantar fascial fibromatosis: Secondary | ICD-10-CM | POA: Diagnosis present

## 2020-02-24 DIAGNOSIS — I2724 Chronic thromboembolic pulmonary hypertension: Secondary | ICD-10-CM | POA: Diagnosis present

## 2020-02-24 DIAGNOSIS — Z8711 Personal history of peptic ulcer disease: Secondary | ICD-10-CM

## 2020-02-24 DIAGNOSIS — N12 Tubulo-interstitial nephritis, not specified as acute or chronic: Secondary | ICD-10-CM

## 2020-02-24 DIAGNOSIS — E785 Hyperlipidemia, unspecified: Secondary | ICD-10-CM | POA: Diagnosis present

## 2020-02-24 DIAGNOSIS — F419 Anxiety disorder, unspecified: Secondary | ICD-10-CM | POA: Diagnosis present

## 2020-02-24 DIAGNOSIS — R0789 Other chest pain: Secondary | ICD-10-CM | POA: Diagnosis present

## 2020-02-24 DIAGNOSIS — M1990 Unspecified osteoarthritis, unspecified site: Secondary | ICD-10-CM | POA: Diagnosis present

## 2020-02-24 DIAGNOSIS — Z86711 Personal history of pulmonary embolism: Secondary | ICD-10-CM

## 2020-02-24 DIAGNOSIS — Z79891 Long term (current) use of opiate analgesic: Secondary | ICD-10-CM

## 2020-02-24 DIAGNOSIS — Z9049 Acquired absence of other specified parts of digestive tract: Secondary | ICD-10-CM

## 2020-02-24 DIAGNOSIS — M069 Rheumatoid arthritis, unspecified: Secondary | ICD-10-CM | POA: Diagnosis present

## 2020-02-24 DIAGNOSIS — Z6841 Body Mass Index (BMI) 40.0 and over, adult: Secondary | ICD-10-CM

## 2020-02-24 DIAGNOSIS — I2510 Atherosclerotic heart disease of native coronary artery without angina pectoris: Secondary | ICD-10-CM | POA: Diagnosis present

## 2020-02-24 DIAGNOSIS — K921 Melena: Principal | ICD-10-CM | POA: Diagnosis present

## 2020-02-24 DIAGNOSIS — Z813 Family history of other psychoactive substance abuse and dependence: Secondary | ICD-10-CM

## 2020-02-24 DIAGNOSIS — B9620 Unspecified Escherichia coli [E. coli] as the cause of diseases classified elsewhere: Secondary | ICD-10-CM | POA: Diagnosis present

## 2020-02-24 DIAGNOSIS — K449 Diaphragmatic hernia without obstruction or gangrene: Secondary | ICD-10-CM | POA: Diagnosis present

## 2020-02-24 DIAGNOSIS — F329 Major depressive disorder, single episode, unspecified: Secondary | ICD-10-CM | POA: Diagnosis present

## 2020-02-24 DIAGNOSIS — K219 Gastro-esophageal reflux disease without esophagitis: Secondary | ICD-10-CM | POA: Diagnosis present

## 2020-02-24 DIAGNOSIS — R7303 Prediabetes: Secondary | ICD-10-CM | POA: Diagnosis present

## 2020-02-24 DIAGNOSIS — I110 Hypertensive heart disease with heart failure: Secondary | ICD-10-CM | POA: Diagnosis present

## 2020-02-24 DIAGNOSIS — G894 Chronic pain syndrome: Secondary | ICD-10-CM | POA: Diagnosis present

## 2020-02-24 DIAGNOSIS — B9689 Other specified bacterial agents as the cause of diseases classified elsewhere: Secondary | ICD-10-CM | POA: Diagnosis present

## 2020-02-24 DIAGNOSIS — Z7901 Long term (current) use of anticoagulants: Secondary | ICD-10-CM

## 2020-02-24 DIAGNOSIS — R000 Tachycardia, unspecified: Secondary | ICD-10-CM | POA: Diagnosis present

## 2020-02-24 DIAGNOSIS — Z90710 Acquired absence of both cervix and uterus: Secondary | ICD-10-CM

## 2020-02-24 DIAGNOSIS — Z9989 Dependence on other enabling machines and devices: Secondary | ICD-10-CM

## 2020-02-24 DIAGNOSIS — E876 Hypokalemia: Secondary | ICD-10-CM | POA: Diagnosis present

## 2020-02-24 DIAGNOSIS — Z818 Family history of other mental and behavioral disorders: Secondary | ICD-10-CM

## 2020-02-24 DIAGNOSIS — G4733 Obstructive sleep apnea (adult) (pediatric): Secondary | ICD-10-CM

## 2020-02-24 DIAGNOSIS — I10 Essential (primary) hypertension: Secondary | ICD-10-CM | POA: Diagnosis present

## 2020-02-24 DIAGNOSIS — I5032 Chronic diastolic (congestive) heart failure: Secondary | ICD-10-CM | POA: Diagnosis present

## 2020-02-24 DIAGNOSIS — Z79899 Other long term (current) drug therapy: Secondary | ICD-10-CM

## 2020-02-24 DIAGNOSIS — K922 Gastrointestinal hemorrhage, unspecified: Secondary | ICD-10-CM

## 2020-02-24 DIAGNOSIS — I959 Hypotension, unspecified: Secondary | ICD-10-CM | POA: Diagnosis present

## 2020-02-24 DIAGNOSIS — E669 Obesity, unspecified: Secondary | ICD-10-CM | POA: Diagnosis present

## 2020-02-24 DIAGNOSIS — R7881 Bacteremia: Secondary | ICD-10-CM | POA: Diagnosis present

## 2020-02-24 DIAGNOSIS — Z20822 Contact with and (suspected) exposure to covid-19: Secondary | ICD-10-CM | POA: Diagnosis present

## 2020-02-24 DIAGNOSIS — G4730 Sleep apnea, unspecified: Secondary | ICD-10-CM | POA: Diagnosis present

## 2020-02-24 DIAGNOSIS — Z801 Family history of malignant neoplasm of trachea, bronchus and lung: Secondary | ICD-10-CM

## 2020-02-24 DIAGNOSIS — K2970 Gastritis, unspecified, without bleeding: Secondary | ICD-10-CM | POA: Diagnosis present

## 2020-02-24 LAB — COMPREHENSIVE METABOLIC PANEL
ALT: 10 U/L (ref 0–44)
AST: 17 U/L (ref 15–41)
Albumin: 3.8 g/dL (ref 3.5–5.0)
Alkaline Phosphatase: 75 U/L (ref 38–126)
Anion gap: 10 (ref 5–15)
BUN: 8 mg/dL (ref 6–20)
CO2: 24 mmol/L (ref 22–32)
Calcium: 9.6 mg/dL (ref 8.9–10.3)
Chloride: 107 mmol/L (ref 98–111)
Creatinine, Ser: 1 mg/dL (ref 0.44–1.00)
GFR calc Af Amer: >60 mL/min (ref >60)
GFR calc non Af Amer: >60 mL/min (ref >60)
Glucose, Bld: 100 mg/dL — ABNORMAL HIGH (ref 70–99)
Potassium: 3.7 mmol/L (ref 3.5–5.1)
Sodium: 141 mmol/L (ref 135–145)
Total Bilirubin: 0.6 mg/dL (ref 0.3–1.2)
Total Protein: 7.2 g/dL (ref 6.5–8.1)

## 2020-02-24 LAB — CBC
HCT: 40.5 % (ref 36.0–46.0)
Hemoglobin: 12.6 g/dL (ref 12.0–15.0)
MCH: 21.4 pg — ABNORMAL LOW (ref 26.0–34.0)
MCHC: 31.1 g/dL (ref 30.0–36.0)
MCV: 68.9 fL — ABNORMAL LOW (ref 80.0–100.0)
Platelets: 279 10*3/uL (ref 150–400)
RBC: 5.88 MIL/uL — ABNORMAL HIGH (ref 3.87–5.11)
RDW: 18.6 % — ABNORMAL HIGH (ref 11.5–15.5)
WBC: 8.6 10*3/uL (ref 4.0–10.5)
nRBC: 0 % (ref 0.0–0.2)

## 2020-02-24 LAB — TYPE AND SCREEN
ABO/RH(D): AB POS
Antibody Screen: NEGATIVE

## 2020-02-24 NOTE — ED Triage Notes (Signed)
Pt arrives to ED w/ c/o black, tar-like stools x 2 days. Pt c/o RUQ abdominal pain. Pt also endorses generalized weakness during this time.

## 2020-02-24 NOTE — ED Notes (Signed)
Orthostatic VS not completed, clicked off by accident

## 2020-02-25 ENCOUNTER — Emergency Department (HOSPITAL_COMMUNITY): Payer: Medicare HMO

## 2020-02-25 DIAGNOSIS — Z86711 Personal history of pulmonary embolism: Secondary | ICD-10-CM | POA: Diagnosis not present

## 2020-02-25 DIAGNOSIS — F329 Major depressive disorder, single episode, unspecified: Secondary | ICD-10-CM | POA: Diagnosis present

## 2020-02-25 DIAGNOSIS — B9620 Unspecified Escherichia coli [E. coli] as the cause of diseases classified elsewhere: Secondary | ICD-10-CM | POA: Diagnosis present

## 2020-02-25 DIAGNOSIS — Z6841 Body Mass Index (BMI) 40.0 and over, adult: Secondary | ICD-10-CM | POA: Diagnosis not present

## 2020-02-25 DIAGNOSIS — K922 Gastrointestinal hemorrhage, unspecified: Secondary | ICD-10-CM | POA: Diagnosis present

## 2020-02-25 DIAGNOSIS — R000 Tachycardia, unspecified: Secondary | ICD-10-CM | POA: Diagnosis present

## 2020-02-25 DIAGNOSIS — R7881 Bacteremia: Secondary | ICD-10-CM | POA: Diagnosis present

## 2020-02-25 DIAGNOSIS — N12 Tubulo-interstitial nephritis, not specified as acute or chronic: Secondary | ICD-10-CM | POA: Insufficient documentation

## 2020-02-25 DIAGNOSIS — I2724 Chronic thromboembolic pulmonary hypertension: Secondary | ICD-10-CM | POA: Diagnosis not present

## 2020-02-25 DIAGNOSIS — K2970 Gastritis, unspecified, without bleeding: Secondary | ICD-10-CM | POA: Diagnosis present

## 2020-02-25 DIAGNOSIS — I2510 Atherosclerotic heart disease of native coronary artery without angina pectoris: Secondary | ICD-10-CM | POA: Diagnosis present

## 2020-02-25 DIAGNOSIS — K219 Gastro-esophageal reflux disease without esophagitis: Secondary | ICD-10-CM | POA: Diagnosis present

## 2020-02-25 DIAGNOSIS — M1990 Unspecified osteoarthritis, unspecified site: Secondary | ICD-10-CM | POA: Diagnosis present

## 2020-02-25 DIAGNOSIS — E876 Hypokalemia: Secondary | ICD-10-CM | POA: Diagnosis present

## 2020-02-25 DIAGNOSIS — G894 Chronic pain syndrome: Secondary | ICD-10-CM | POA: Diagnosis present

## 2020-02-25 DIAGNOSIS — F419 Anxiety disorder, unspecified: Secondary | ICD-10-CM | POA: Diagnosis present

## 2020-02-25 DIAGNOSIS — I5032 Chronic diastolic (congestive) heart failure: Secondary | ICD-10-CM | POA: Diagnosis present

## 2020-02-25 DIAGNOSIS — Z8711 Personal history of peptic ulcer disease: Secondary | ICD-10-CM | POA: Diagnosis not present

## 2020-02-25 DIAGNOSIS — I110 Hypertensive heart disease with heart failure: Secondary | ICD-10-CM | POA: Diagnosis present

## 2020-02-25 DIAGNOSIS — B9689 Other specified bacterial agents as the cause of diseases classified elsewhere: Secondary | ICD-10-CM | POA: Diagnosis present

## 2020-02-25 DIAGNOSIS — Z7901 Long term (current) use of anticoagulants: Secondary | ICD-10-CM | POA: Diagnosis not present

## 2020-02-25 DIAGNOSIS — K921 Melena: Secondary | ICD-10-CM | POA: Diagnosis present

## 2020-02-25 DIAGNOSIS — Z20822 Contact with and (suspected) exposure to covid-19: Secondary | ICD-10-CM | POA: Diagnosis present

## 2020-02-25 DIAGNOSIS — R0789 Other chest pain: Secondary | ICD-10-CM | POA: Diagnosis present

## 2020-02-25 DIAGNOSIS — I959 Hypotension, unspecified: Secondary | ICD-10-CM | POA: Diagnosis present

## 2020-02-25 LAB — HEMOGLOBIN AND HEMATOCRIT, BLOOD
HCT: 39 % (ref 36.0–46.0)
Hemoglobin: 11.8 g/dL — ABNORMAL LOW (ref 12.0–15.0)

## 2020-02-25 LAB — LIPASE, BLOOD: Lipase: 23 U/L (ref 11–51)

## 2020-02-25 LAB — URINALYSIS, ROUTINE W REFLEX MICROSCOPIC
Bilirubin Urine: NEGATIVE
Glucose, UA: NEGATIVE mg/dL
Ketones, ur: NEGATIVE mg/dL
Nitrite: NEGATIVE
Protein, ur: 30 mg/dL — AB
Specific Gravity, Urine: 1.019 (ref 1.005–1.030)
pH: 6 (ref 5.0–8.0)

## 2020-02-25 LAB — LACTIC ACID, PLASMA: Lactic Acid, Venous: 0.9 mmol/L (ref 0.5–1.9)

## 2020-02-25 LAB — HIV ANTIBODY (ROUTINE TESTING W REFLEX): HIV Screen 4th Generation wRfx: NONREACTIVE

## 2020-02-25 LAB — SARS CORONAVIRUS 2 BY RT PCR (HOSPITAL ORDER, PERFORMED IN ~~LOC~~ HOSPITAL LAB): SARS Coronavirus 2: NEGATIVE

## 2020-02-25 LAB — POC OCCULT BLOOD, ED: Fecal Occult Bld: NEGATIVE

## 2020-02-25 MED ORDER — SODIUM CHLORIDE 0.9 % IV SOLN
1.0000 g | Freq: Once | INTRAVENOUS | Status: AC
  Administered 2020-02-25: 1 g via INTRAVENOUS
  Filled 2020-02-25: qty 10

## 2020-02-25 MED ORDER — MIRTAZAPINE 45 MG PO TABS: 45.0000 mg | ORAL_TABLET | Freq: Every day | ORAL | Status: DC

## 2020-02-25 MED ORDER — ATORVASTATIN CALCIUM 10 MG PO TABS
20.0000 mg | ORAL_TABLET | Freq: Every day | ORAL | Status: DC
  Administered 2020-02-25 – 2020-02-27 (×3): 20 mg via ORAL
  Filled 2020-02-25 (×3): qty 2

## 2020-02-25 MED ORDER — RIOCIGUAT 2 MG PO TABS
2.0000 mg | ORAL_TABLET | Freq: Three times a day (TID) | ORAL | Status: DC
  Administered 2020-02-25 – 2020-02-28 (×8): 2 mg via ORAL
  Filled 2020-02-25 (×10): qty 1

## 2020-02-25 MED ORDER — SODIUM CHLORIDE 0.9 % IV SOLN: INTRAVENOUS | Status: DC

## 2020-02-25 MED ORDER — SODIUM CHLORIDE 0.9 % IV SOLN
1.0000 g | INTRAVENOUS | Status: DC
  Administered 2020-02-26: 1 g via INTRAVENOUS
  Filled 2020-02-25: qty 1
  Filled 2020-02-25: qty 10

## 2020-02-25 MED ORDER — RIOCIGUAT 2 MG PO TABS: 2.0000 mg | ORAL_TABLET | Freq: Three times a day (TID) | ORAL | Status: DC

## 2020-02-25 MED ORDER — GABAPENTIN 300 MG PO CAPS
600.0000 mg | ORAL_CAPSULE | Freq: Three times a day (TID) | ORAL | Status: DC
  Administered 2020-02-25 – 2020-02-28 (×9): 600 mg via ORAL
  Filled 2020-02-25 (×9): qty 2

## 2020-02-25 MED ORDER — ENOXAPARIN SODIUM 40 MG/0.4ML ~~LOC~~ SOLN
40.0000 mg | SUBCUTANEOUS | Status: DC
  Administered 2020-02-25: 40 mg via SUBCUTANEOUS
  Filled 2020-02-25: qty 0.4

## 2020-02-25 MED ORDER — FLUTICASONE PROPIONATE 50 MCG/ACT NA SUSP
2.0000 | Freq: Every day | NASAL | Status: DC | PRN
  Filled 2020-02-25: qty 16

## 2020-02-25 MED ORDER — HYDROMORPHONE HCL 1 MG/ML IJ SOLN
0.5000 mg | INTRAMUSCULAR | Status: DC | PRN
  Administered 2020-02-25 – 2020-02-26 (×3): 1 mg via INTRAVENOUS
  Administered 2020-02-26: 0.5 mg via INTRAVENOUS
  Administered 2020-02-26 – 2020-02-28 (×7): 1 mg via INTRAVENOUS
  Filled 2020-02-25 (×11): qty 1

## 2020-02-25 MED ORDER — TRAZODONE HCL 150 MG PO TABS: 150.0000 mg | ORAL_TABLET | Freq: Every evening | ORAL | Status: DC | PRN

## 2020-02-25 MED ORDER — SODIUM CHLORIDE 0.9% FLUSH: 10.0000 mL | INTRAVENOUS | Status: DC | PRN

## 2020-02-25 MED ORDER — DICLOFENAC SODIUM 1 % TD GEL: 2.0000 g | Freq: Four times a day (QID) | TRANSDERMAL | Status: DC | PRN

## 2020-02-25 MED ORDER — ACETAMINOPHEN-CODEINE #4 300-60 MG PO TABS: 1.0000 | ORAL_TABLET | ORAL | Status: DC | PRN

## 2020-02-25 MED ORDER — POLYETHYLENE GLYCOL 3350 17 GM/SCOOP PO POWD: 17.0000 g | Freq: Two times a day (BID) | ORAL | Status: DC | PRN

## 2020-02-25 MED ORDER — MELATONIN 5 MG PO TABS
5.0000 mg | ORAL_TABLET | Freq: Every day | ORAL | Status: DC
  Administered 2020-02-25 – 2020-02-27 (×3): 5 mg via ORAL
  Filled 2020-02-25 (×4): qty 1

## 2020-02-25 MED ORDER — ONDANSETRON HCL 4 MG/2ML IJ SOLN
4.0000 mg | Freq: Once | INTRAMUSCULAR | Status: AC
  Administered 2020-02-25: 4 mg via INTRAVENOUS
  Filled 2020-02-25: qty 2

## 2020-02-25 MED ORDER — MECLIZINE HCL 25 MG PO TABS
25.0000 mg | ORAL_TABLET | Freq: Two times a day (BID) | ORAL | Status: DC | PRN
  Administered 2020-02-27: 25 mg via ORAL
  Filled 2020-02-25 (×2): qty 1

## 2020-02-25 MED ORDER — ALBUTEROL SULFATE (2.5 MG/3ML) 0.083% IN NEBU: 3.0000 mL | INHALATION_SOLUTION | Freq: Four times a day (QID) | RESPIRATORY_TRACT | Status: DC | PRN

## 2020-02-25 MED ORDER — ACETAMINOPHEN 325 MG PO TABS
650.0000 mg | ORAL_TABLET | Freq: Four times a day (QID) | ORAL | Status: DC | PRN
  Administered 2020-02-26 – 2020-02-27 (×2): 650 mg via ORAL
  Filled 2020-02-25 (×3): qty 2

## 2020-02-25 MED ORDER — DOCUSATE SODIUM 100 MG PO CAPS
100.0000 mg | ORAL_CAPSULE | Freq: Two times a day (BID) | ORAL | Status: DC
  Administered 2020-02-25 – 2020-02-28 (×6): 100 mg via ORAL
  Filled 2020-02-25 (×6): qty 1

## 2020-02-25 MED ORDER — SODIUM CHLORIDE 0.9 % IV BOLUS (SEPSIS)
1000.0000 mL | Freq: Once | INTRAVENOUS | Status: AC
  Administered 2020-02-25: 1000 mL via INTRAVENOUS

## 2020-02-25 MED ORDER — MONTELUKAST SODIUM 10 MG PO TABS
10.0000 mg | ORAL_TABLET | Freq: Every evening | ORAL | Status: DC
  Administered 2020-02-25 – 2020-02-27 (×3): 10 mg via ORAL
  Filled 2020-02-25 (×3): qty 1

## 2020-02-25 MED ORDER — SODIUM CHLORIDE 0.9 % IV SOLN: 1.0000 g | INTRAVENOUS | Status: DC

## 2020-02-25 MED ORDER — DICYCLOMINE HCL 10 MG PO CAPS: 10.0000 mg | ORAL_CAPSULE | Freq: Three times a day (TID) | ORAL | Status: DC | PRN

## 2020-02-25 MED ORDER — CYCLOBENZAPRINE HCL 10 MG PO TABS: 10.0000 mg | ORAL_TABLET | Freq: Three times a day (TID) | ORAL | Status: DC | PRN

## 2020-02-25 MED ORDER — CHLORHEXIDINE GLUCONATE CLOTH 2 % EX PADS
6.0000 | MEDICATED_PAD | Freq: Every day | CUTANEOUS | Status: DC
  Administered 2020-02-25 – 2020-02-28 (×4): 6 via TOPICAL

## 2020-02-25 MED ORDER — MIRTAZAPINE 30 MG PO TABS
60.0000 mg | ORAL_TABLET | Freq: Every day | ORAL | Status: DC
  Administered 2020-02-25 – 2020-02-27 (×3): 60 mg via ORAL
  Filled 2020-02-25: qty 4
  Filled 2020-02-25 (×2): qty 2
  Filled 2020-02-25: qty 4
  Filled 2020-02-25 (×2): qty 2
  Filled 2020-02-25: qty 4

## 2020-02-25 MED ORDER — MORPHINE SULFATE (PF) 4 MG/ML IV SOLN
4.0000 mg | Freq: Once | INTRAVENOUS | Status: AC
  Administered 2020-02-25: 4 mg via INTRAVENOUS
  Filled 2020-02-25: qty 1

## 2020-02-25 MED ORDER — ACETAMINOPHEN-CODEINE #3 300-30 MG PO TABS
1.0000 | ORAL_TABLET | ORAL | Status: DC | PRN
  Administered 2020-02-26: 1 via ORAL
  Filled 2020-02-25: qty 1

## 2020-02-25 MED ORDER — IOHEXOL 300 MG/ML  SOLN
100.0000 mL | Freq: Once | INTRAMUSCULAR | Status: AC | PRN
  Administered 2020-02-25: 100 mL via INTRAVENOUS

## 2020-02-25 MED ORDER — SODIUM CHLORIDE 0.9 % IV SOLN
1000.0000 mL | INTRAVENOUS | Status: DC
  Administered 2020-02-25 – 2020-02-27 (×4): 1000 mL via INTRAVENOUS

## 2020-02-25 MED ORDER — POLYETHYLENE GLYCOL 3350 17 G PO PACK: 17.0000 g | PACK | Freq: Two times a day (BID) | ORAL | Status: DC | PRN

## 2020-02-25 MED ORDER — CODEINE SULFATE 15 MG PO TABS: 30.0000 mg | ORAL_TABLET | ORAL | Status: DC | PRN

## 2020-02-25 MED ORDER — ACETAMINOPHEN 325 MG PO TABS
650.0000 mg | ORAL_TABLET | Freq: Once | ORAL | Status: AC
  Administered 2020-02-27: 650 mg via ORAL

## 2020-02-25 MED ORDER — LORAZEPAM 0.5 MG PO TABS: 0.5000 mg | ORAL_TABLET | Freq: Three times a day (TID) | ORAL | Status: DC | PRN

## 2020-02-25 MED ORDER — PANTOPRAZOLE SODIUM 40 MG IV SOLR
40.0000 mg | Freq: Two times a day (BID) | INTRAVENOUS | Status: DC
  Administered 2020-02-25: 40 mg via INTRAVENOUS
  Filled 2020-02-25: qty 40

## 2020-02-25 MED ORDER — ACETAMINOPHEN 650 MG RE SUPP: 650.0000 mg | Freq: Four times a day (QID) | RECTAL | Status: DC | PRN

## 2020-02-25 MED ORDER — ACETAMINOPHEN 325 MG PO TABS
650.0000 mg | ORAL_TABLET | Freq: Once | ORAL | Status: AC
  Administered 2020-02-25: 650 mg via ORAL
  Filled 2020-02-25: qty 2

## 2020-02-25 NOTE — Consult Note (Addendum)
Referring Provider: [facility] Primary Care Physician:  Ruthine Dose, MD Primary Gastroenterologist:  Dr. Alessandra Bevels  Reason for Consultation:  melena, epigastric abdominal pain  HPI: [name] [name] is a 59 y.o. female with history of gastritis, GERD, cholecystectomy, pulmonary embolism (on Xarelto), CHF (EF 60-65% as of 05/2019), morbid obesity (BMI 58.99), and OSA (on CPAP) admitted today with possible sepsis presenting with epigastric pain and "melena".  Patient reports she has been feeling unwell since Tuesday 6/15.  She has had epigastric abdominal pain, chest pain, dry heaving, and "black, sticky" stools.  She has been having approximately 2 soft "melenic" stools per day since 6/15.  Prior to this, she had 1 brown, formed bowel movement daily to every other day.    She has also had a decreased appetite and decreased oral intake since Tuesday and thinks she has lost about 5 pounds.  She denies any vomiting or hematemesis.  Denies dysphagia but reports chronic reflux.  She denies diarrhea, constipation, and hematemesis.  She was febrile to 101.43F this morning and has also been having bilateral lower quadrant abdominal pain, lower back pain, and dark and malodorous urine and was suspected to have a UTI/pyelonephritis.  UA showed small leukocytes, few bacteria, and small Hgb.  She takes Xarelto, last dose Wednesday 6/16.  She denies any aspirin or NSAID use.  Patient denies any family history of colon cancer or gastrointestinal malignancies, though she states her father had "stomach problems."  CT today 6/18: No acute findings in the abdomen or pelvis  EGD 08/15/2014-Impression: Gastritis, GERD, hiatal hernia Colonoscopy 08/15/2014: normal Virtual colonoscopy 03/22/2019: unremarkable  Past Medical History:  Diagnosis Date   Anxiety    Arthritis    CHF exacerbation ([place]) 08/21/2017   Depression    Difficult intubation    See [facility] anesthesia records in Care Everywhere    Diverticulitis    Dyspnea    with exertion   Family history of adverse reaction to anesthesia    " My mother stopped breathing during procedure"   GERD (gastroesophageal reflux disease)    PMH; resolved   Hyperlipidemia    Hypertension    Obesity    Plantar fasciitis, left    Pneumonia    Port-A-Cath in place    Pre-diabetes    Pulmonary embolism ([place])    Pulmonary hypertension ([place])    Sleep apnea    wears CPAP # 9   Wears glasses     Past Surgical History:  Procedure Laterality Date   ABDOMINAL HYSTERECTOMY     BREAST REDUCTION SURGERY     CHOLECYSTECTOMY     COLONOSCOPY W/ BIOPSIES AND POLYPECTOMY     EMBOLECTOMY N/A    pulmonary embolectomy   FRACTURE SURGERY     right ankle   GASTROCNEMIUS RECESSION Left 02/12/2019   Procedure: LEFT GASTROCNEMIUS RECESSION, PLANTAR FASCIA RELEASE;  Surgeon: Newt Minion, MD;  Location: [facility];  Service: Orthopedics;  Laterality: Left;   IVC FILTER INSERTION     REDUCTION MAMMAPLASTY Bilateral    RIGHT HEART CATH N/A 10/28/2016   Procedure: Right Heart Cath;  Surgeon: Jolaine Artist, MD;  Location: [facility];  Service: Cardiovascular;  Laterality: N/A;   RIGHT HEART CATH N/A 08/26/2017   Procedure: RIGHT HEART CATH;  Surgeon: Jolaine Artist, MD;  Location: [facility];  Service: Cardiovascular;  Laterality: N/A;   RIGHT HEART CATH N/A 07/05/2019   Procedure: RIGHT HEART CATH;  Surgeon: Jolaine Artist, MD;  Location: [facility];  Service: Cardiovascular;  Laterality: N/A;   ULTRASOUND GUIDANCE FOR VASCULAR ACCESS  08/26/2017   Procedure: Ultrasound Guidance For Vascular Access;  Surgeon: Jolaine Artist, MD;  Location: [facility];  Service: Cardiovascular;;   WISDOM TOOTH EXTRACTION      Prior to Admission medications   Medication Sig Start Date End Date Taking? Authorizing Provider  acetaminophen-codeine (TYLENOL #4) 300-60 MG tablet Take 1 tablet by mouth every 4 (four) hours as  needed for pain.   Yes [provider]  albuterol (VENTOLIN HFA) 108 (90 Base) MCG/ACT inhaler Inhale 2 puffs into the lungs every 6 (six) hours as needed for wheezing or shortness of breath. 11/24/19  Yes [provider]  atorvastatin (LIPITOR) 20 MG tablet Take 1 tablet (20 mg total) by mouth daily at 6 PM. 09/17/18  Yes Ladell Pier, MD  cyclobenzaprine (FLEXERIL) 10 MG tablet Take 10 mg by mouth 3 (three) times daily as needed for muscle spasms. 02/18/20  Yes [provider]  diclofenac sodium (VOLTAREN) 1 % GEL Apply 2 g topically 4 (four) times daily as needed (knee and back pain).   Yes [provider]  dicyclomine (BENTYL) 10 MG capsule Take 10 mg by mouth 3 (three) times daily as needed. Stomach cramps 02/06/20  Yes [provider]  docusate sodium (COLACE) 100 MG capsule Take 1 capsule (100 mg total) by mouth every 12 (twelve) hours. 10/24/16  Yes Harris, Abigail, PA-C  fluticasone (FLONASE) 50 MCG/ACT nasal spray Place 2 sprays into both nostrils daily. Patient taking differently: Place 2 sprays into both nostrils daily as needed for allergies.  02/04/17  Yes Langeland, Dawn T, MD  furosemide (LASIX) 40 MG tablet Take 1 tablet (40 mg total) by mouth daily. Take extra 40 mg tablet once in the afternoon AS NEEDED for weight gain 3 lbs or more. 09/17/18  Yes Ladell Pier, MD  gabapentin (NEURONTIN) 300 MG capsule Take 600 mg by mouth 3 (three) times daily.   Yes [provider]  LORazepam (ATIVAN) 0.5 MG tablet Take 0.5 mg by mouth every 8 (eight) hours as needed for anxiety or sleep.   Yes [provider]  meclizine (ANTIVERT) 25 MG tablet Take 25 mg by mouth 2 (two) times daily as needed for dizziness.   Yes [provider]  Melatonin 5 MG TABS Take 5 mg by mouth at bedtime.   Yes [provider]  mirtazapine (REMERON) 15 MG tablet Take 15 mg by mouth at bedtime. Total dosage is 60 mg   Yes [provider]  mirtazapine (REMERON) 45 MG tablet Take 45 mg by mouth at bedtime. Total dosage is 60 mg   Yes [provider]  montelukast (SINGULAIR) 10 MG tablet Take 10 mg by mouth every evening. 02/21/20  Yes [provider]  omeprazole (PRILOSEC) 40 MG capsule Take 40 mg by mouth 2 (two) times daily. 02/01/20  Yes [provider]  polyethylene glycol powder (GLYCOLAX/MIRALAX) powder Take 17 g by mouth 2 (two) times daily as needed. Patient taking differently: Take 17 g by mouth 2 (two) times daily as needed for mild constipation.  06/03/18  Yes McClung, Dionne Bucy, PA-C  Riociguat (ADEMPAS) 2 MG TABS Take 2 mg by mouth 3 (three) times daily.    Yes [provider]  spironolactone (ALDACTONE) 25 MG tablet Take 12.5 mg by mouth every other day.   Yes [provider]  traZODone (DESYREL) 150  MG tablet Take 150 mg by mouth at bedtime as needed for sleep. 01/10/20  Yes [provider]  XARELTO 20 MG TABS tablet TAKE 1 TABLET BY MOUTH ONCE DAILY WITH SUPPER Patient taking differently: Take 20 mg by mouth daily with supper.  11/23/19  Yes Mannam, Praveen, MD  methocarbamol (ROBAXIN) 500 MG tablet Take 1 tablet (500 mg total) by mouth 2 (two) times daily as needed. Patient not taking: Reported on 02/25/2020 12/14/19   Aundra Dubin, PA-C  traMADol Veatrice Bourbon) 50 MG tablet Take 1-2 tabs po bid prn pain Patient not taking: Reported on 02/25/2020 10/27/19   Aundra Dubin, PA-C    Scheduled Meds:  acetaminophen  650 mg Oral Once   atorvastatin  20 mg Oral q1800   Chlorhexidine Gluconate Cloth  6 each Topical Daily   docusate sodium  100 mg Oral Q12H   enoxaparin (LOVENOX) injection  40 mg Subcutaneous Q24H   gabapentin  600 mg Oral TID   melatonin  5 mg Oral QHS   mirtazapine  60 mg Oral QHS   montelukast  10 mg Oral QPM   pantoprazole (PROTONIX) IV  40 mg Intravenous Q12H   Riociguat  2 mg Oral TID   Continuous Infusions:  sodium chloride 1,000 mL  (02/25/20 1216)   [START ON 02/26/2020] cefTRIAXone (ROCEPHIN)  IV     PRN Meds:.acetaminophen **OR** acetaminophen, acetaminophen-codeine, albuterol, cyclobenzaprine, diclofenac sodium, dicyclomine, fluticasone, HYDROmorphone (DILAUDID) injection, LORazepam, meclizine, polyethylene glycol, sodium chloride flush, traZODone  Allergies as of 02/24/2020   (No Known Allergies)    Family History  Problem Relation Age of Onset   Lung cancer Maternal Grandmother    Dementia Mother    Dementia Father    Anxiety disorder Sister    Bipolar disorder Sister    Drug abuse Sister    Sexual abuse Maternal Aunt    Anxiety disorder Cousin    Anxiety disorder Sister    Bipolar disorder Sister    Drug abuse Sister    Breast cancer Neg Hx     Social History   Socioeconomic History   Marital status: Married    Spouse name: Not on file   Number of children: Not on file   Years of education: Not on file   Highest education level: Not on file  Occupational History   Not on file  Tobacco Use   Smoking status: Never Smoker   Smokeless tobacco: Never Used  Vaping Use   Vaping Use: Never used  Substance and Sexual Activity   Alcohol use: No   Drug use: No   Sexual activity: Yes    Partners: Male    Birth control/protection: Surgical  Other Topics Concern   Not on file  Social History Narrative   Not on file   Social Determinants of Health   Financial Resource Strain:    Difficulty of Paying Living Expenses:   Food Insecurity:    Worried About Charity fundraiser in the Last Year:    Arboriculturist in the Last Year:   Transportation Needs:    Film/video editor (Medical):    Lack of Transportation (Non-Medical):   Physical Activity:    Days of Exercise per Week:    Minutes of Exercise per Session:   Stress:    Feeling of Stress :   Social Connections:    Frequency of Communication with Friends and Family:    Frequency of Social Gatherings with Friends and Family:  Attends Religious Services:    Active Member of Clubs or Organizations:    Attends Music therapist:    Marital Status:   Intimate Partner Violence:    Fear of Current or Ex-Partner:    Emotionally Abused:    Physically Abused:    Sexually Abused:     Review of Systems: Review of Systems  Constitutional: Positive for fever, malaise/fatigue and weight loss. Negative for chills.  HENT: Negative for hearing loss and tinnitus.   Eyes: Negative for pain and redness.  Respiratory: Negative for cough and shortness of breath.   Cardiovascular: Positive for chest pain. Negative for palpitations.  Gastrointestinal: Positive for abdominal pain, heartburn, melena and nausea. Negative for blood in stool, constipation, diarrhea and vomiting.  Genitourinary: Positive for flank pain. Negative for dysuria.  Musculoskeletal: Positive for back pain (lower back, flanks). Negative for falls.  Skin: Negative for itching and rash.  Neurological: Negative for seizures and loss of consciousness.  Endo/Heme/Allergies: Negative for polydipsia. Bruises/bleeds easily (Xarelto).  Psychiatric/Behavioral: Negative for substance abuse. The patient is not nervous/anxious.      Physical Exam: Vital signs: Vitals:   02/25/20 1430 02/25/20 1458  BP: 119/80 (!) 102/53  Pulse: 80 93  Resp: 20 18  Temp:    SpO2: 99% 99%      Physical Exam Constitutional:      General: She is not in acute distress.    Appearance: She is obese.  HENT:     Head: Normocephalic and atraumatic.     Nose: Nose normal.     Mouth/Throat:     Mouth: Mucous membranes are moist.     Pharynx: Oropharynx is clear.  Eyes:     General: No scleral icterus.    Extraocular Movements: Extraocular movements intact.     Conjunctiva/sclera: Conjunctivae normal.  Cardiovascular:     Rate and Rhythm: Normal rate and regular rhythm.     Pulses: Normal pulses.     Heart sounds: Normal heart sounds.  Pulmonary:     Effort:  Pulmonary effort is normal. No respiratory distress.     Breath sounds: Normal breath sounds.  Abdominal:     General: Bowel sounds are normal. There is no distension.     Palpations: Abdomen is soft. There is no mass.     Tenderness: There is abdominal tenderness (moderate epigastric tenderness, mild diffuse tenderness). There is no guarding or rebound.     Hernia: No hernia is present.  Musculoskeletal:        General: No tenderness.     Cervical back: Normal range of motion and neck supple.     Right lower leg: Edema (1+ pitting) present.     Left lower leg: Edema (1+ pitting) present.  Skin:    General: Skin is warm and dry.  Neurological:     General: No focal deficit present.     Mental Status: She is oriented to person, place, and time. She is lethargic.  Psychiatric:        Mood and Affect: Mood normal.        Behavior: Behavior normal.     GI:  Lab Results: Recent Labs    02/24/20 1826  WBC 8.6  HGB 12.6  HCT 40.5  PLT 279   BMET Recent Labs    02/24/20 1826  NA 141  K 3.7  CL 107  CO2 24  GLUCOSE 100*  BUN 8  CREATININE 1.00  CALCIUM 9.6   LFT Recent Labs  02/24/20 1826  PROT 7.2  ALBUMIN 3.8  AST 17  ALT 10  ALKPHOS 75  BILITOT 0.6   PT/INR No results for input(s): LABPROT, INR in the last 72 hours.   Studies/Results: CT ABDOMEN PELVIS W CONTRAST  Result Date: 02/25/2020 CLINICAL DATA:  59 year old female with history of right upper quadrant abdominal pain and nausea. Diffuse lower abdominal pain. Loss of appetite for 1 week. EXAM: CT ABDOMEN AND PELVIS WITH CONTRAST TECHNIQUE: Multidetector CT imaging of the abdomen and pelvis was performed using the standard protocol following bolus administration of intravenous contrast. CONTRAST:  17mL OMNIPAQUE IOHEXOL 300 MG/ML  SOLN COMPARISON:  CT the abdomen and pelvis 10/24/2016. FINDINGS: Lower chest: Linear scarring in the lower lobes of the lungs bilaterally. Hepatobiliary: No suspicious  cystic or solid hepatic lesions. No intra or extrahepatic biliary ductal dilatation. Status post cholecystectomy. Pancreas: No pancreatic mass. No pancreatic ductal dilatation. No pancreatic or peripancreatic fluid collections or inflammatory changes. Spleen: Unremarkable. Adrenals/Urinary Tract: 2.5 x 1.5 cm right adrenal nodule, indeterminate. Left adrenal gland and right kidney are normal in appearance. Subcentimeter low-attenuation lesion in the interpolar region of the left kidney, too small to characterize, but statistically likely to represent a cyst. No hydroureteronephrosis. Urinary bladder is normal in appearance. Stomach/Bowel: Normal appearance of the stomach. No pathologic dilatation of small bowel or colon. Normal appendix. Vascular/Lymphatic: Aortic atherosclerosis. No lymphadenopathy noted in the abdomen or pelvis. IVC filter in position with tip terminating immediately below the level of the renal veins. No lymphadenopathy noted in the abdomen or pelvis. Reproductive: Status post hysterectomy. Ovaries are not confidently identified may be surgically absent or atrophic. Other: No significant volume of ascites.  No pneumoperitoneum. Musculoskeletal: There are no aggressive appearing lytic or blastic lesions noted in the visualized portions of the skeleton. IMPRESSION: 1. No acute findings are noted in the abdomen or pelvis to account for the patient's symptoms. 2. Status post cholecystectomy. 3. Indeterminate right adrenal nodule measuring 2.5 x 1.5 cm, similar to remote prior study from 10/24/2016, likely to represent a benign lesion such as an adenoma. 4. Aortic atherosclerosis. 5. Additional incidental findings, as above. Electronically Signed   By: Vinnie Langton M.D.   On: 02/25/2020 12:41   DG Chest Portable 1 View  Result Date: 02/25/2020 CLINICAL DATA:  59 year old female with history of cough, shortness of breath and chest pain. EXAM: PORTABLE CHEST 1 VIEW COMPARISON:  Chest x-ray  09/28/2019. FINDINGS: Right subclavian single-lumen porta cath with tip terminating at the superior cavoatrial junction lung volumes are slightly low. Mild elevation of the right hemidiaphragm is unchanged. No acute consolidative airspace disease. No pleural effusions. No evidence of pulmonary edema. No pneumothorax. No definite suspicious appearing pulmonary nodules or masses are noted. Heart size is upper limits of normal. Upper mediastinal contours are within normal limits. Aortic atherosclerosis. Status post median sternotomy. IMPRESSION: 1. No radiographic evidence of acute cardiopulmonary disease. 2. Aortic atherosclerosis. Electronically Signed   By: Vinnie Langton M.D.   On: 02/25/2020 08:32    Impression/ Epigastric abdominal pain and melena in the setting of Xarelto use (last dose 6/16).  Gastritis versus PUD. -Hgb 12.6 on arrival yesterday 6/17, stable compared to baseline  -Normal renal function: BUN 8/CR 1.0 as of 6/17 -History of gastritis and hiatal hernia -FOBT negative -Lipase negative  Fever, possible sepsis.  Suspected UTI.  History of PE, on Xarelto  Morbid obesity (BMI 58.99)  Plan: EGD tomorrow with Dr. Therisa Doyne.  I thoroughly discussed the procedure, nature,  benefits, and risks (to include bleeding, infection, perforation, anesthesia/heart and lung complications) with the patient.  Patient verbalized understanding and gave consent to proceed with EGD with Dr. Therisa Doyne.  Continue to hold Xarelto.  Continue Protonix 40 mg twice daily.  Continue to monitor H&H with transfusion as needed to maintain hemoglobin greater than 7-8.  [facility] will follow.   LOS: 0 days   Salley Slaughter  PA-C 02/25/2020, 3:43 PM  Contact #  [phone]

## 2020-02-25 NOTE — ED Notes (Addendum)
Pt noted to have sudden onset of tachycardia, chills, and tachypnea; Dr. Tomi Bamberger notified; pt placed on 2L O2 via [place]

## 2020-02-25 NOTE — ED Notes (Signed)
Pt transported to CT ?

## 2020-02-25 NOTE — H&P (Signed)
History and Physical    [name] [name] QIH:[id number] DOB: April 23, 1961 DOA: 02/24/2020  PCP: Ruthine Dose, MD (Confirm with patient/family/NH records and if not entered, this has to be entered at [facility] point of entry) Patient coming from: Home  I have personally briefly reviewed patient's old medical records in [place]  Chief Complaint: Adb pain, black tarry stools.  HPI: [name] [name] is a 59 y.o. 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  3 days ago, with new onset of cramping like epigastric abdominal pain, constant, feeling nauseous, not been eating or drinking for last 2 days.  However she passed 3-5 blood tarry formed BM for the last 2 days, and abdominal pain had no improvement.  She also started to feel sharp-like chest pain across from chest, she said she had similar chest pain occasionally since 2017 as has been following with cardiologist who considered the chest pain is related to her severe pulmonary HTN.  Patient also reported to have episodes of chills since yesterday but no fever.  She also found her urine color turned darker and became foul-smelled since yesterday.  She denies any cough no short of breath no diarrhea. ED Course: CT abd showed no acute findings.  And spiked fever one 1.2 in the ED, urine showed UTI.  WBC 8.6 hemoglobin 12.6, BUN 8, creatinine 1.0.  Review of Systems: As per HPI otherwise 10 point review of systems negative.    Past Medical History:  Diagnosis Date  . Anxiety   . Arthritis   . CHF exacerbation ([place]) 08/21/2017  . Depression   . Difficult intubation    See [facility] anesthesia records in [place]  . Diverticulitis   . Dyspnea     with exertion  . Family history of adverse reaction to anesthesia    " My mother stopped breathing during procedure"  . GERD (gastroesophageal reflux disease)    PMH; resolved  . Hyperlipidemia   . Hypertension   . Obesity   . Plantar fasciitis, left   . Pneumonia   . Port-A-Cath in place   . Pre-diabetes   . Pulmonary embolism ([place])   . Pulmonary hypertension ([place])   . Sleep apnea    wears CPAP # 9  . Wears glasses     Past Surgical History:  Procedure Laterality Date  . ABDOMINAL HYSTERECTOMY    . BREAST REDUCTION SURGERY    . CHOLECYSTECTOMY    . COLONOSCOPY W/ BIOPSIES AND POLYPECTOMY    . EMBOLECTOMY N/A    pulmonary embolectomy  . FRACTURE SURGERY     right ankle  . GASTROCNEMIUS RECESSION Left 02/12/2019   Procedure: LEFT GASTROCNEMIUS RECESSION, PLANTAR FASCIA RELEASE;  Surgeon: Newt Minion, MD;  Location: [place];  Service: Orthopedics;  Laterality: Left;  . IVC FILTER INSERTION    . REDUCTION MAMMAPLASTY Bilateral   . RIGHT HEART CATH N/A 10/28/2016   Procedure: Right Heart Cath;  Surgeon: Jolaine Artist, MD;  Location: [place] [facility];  Service: Cardiovascular;  Laterality: N/A;  . RIGHT HEART CATH N/A 08/26/2017   Procedure: RIGHT HEART CATH;  Surgeon: Jolaine Artist, MD;  Location: [place] [facility];  Service: Cardiovascular;  Laterality: N/A;  .  RIGHT HEART CATH N/A 07/05/2019   Procedure: RIGHT HEART CATH;  Surgeon: Jolaine Artist, MD;  Location: [place] [facility];  Service: Cardiovascular;  Laterality: N/A;  . ULTRASOUND GUIDANCE FOR VASCULAR ACCESS  08/26/2017   Procedure: Ultrasound Guidance For Vascular Access;  Surgeon: Jolaine Artist, MD;  Location: [place] [facility];  Service: Cardiovascular;;  . WISDOM TOOTH EXTRACTION       reports that she has never smoked. She has never used smokeless tobacco. She reports that she does not drink alcohol and does not use drugs.  No Known Allergies  Family History  Problem Relation Age of  Onset  . Lung cancer Maternal Grandmother   . Dementia Mother   . Dementia Father   . Anxiety disorder Sister   . Bipolar disorder Sister   . Drug abuse Sister   . Sexual abuse Maternal Aunt   . Anxiety disorder Cousin   . Anxiety disorder Sister   . Bipolar disorder Sister   . Drug abuse Sister   . Breast cancer Neg Hx      Prior to Admission medications   Medication Sig Start Date End Date Taking? Authorizing Provider  acetaminophen-codeine (TYLENOL #4) 300-60 MG tablet Take 1 tablet by mouth every 4 (four) hours as needed for pain.   Yes [provider]  albuterol (VENTOLIN HFA) 108 (90 Base) MCG/ACT inhaler Inhale 2 puffs into the lungs every 6 (six) hours as needed for wheezing or shortness of breath. 11/24/19  Yes [provider]  atorvastatin (LIPITOR) 20 MG tablet Take 1 tablet (20 mg total) by mouth daily at 6 PM. 09/17/18  Yes Ladell Pier, MD  cyclobenzaprine (FLEXERIL) 10 MG tablet Take 10 mg by mouth 3 (three) times daily as needed for muscle spasms. 02/18/20  Yes [provider]  diclofenac sodium (VOLTAREN) 1 % GEL Apply 2 g topically 4 (four) times daily as needed (knee and back pain).   Yes [provider]  dicyclomine (BENTYL) 10 MG capsule Take 10 mg by mouth 3 (three) times daily as needed. Stomach cramps 02/06/20  Yes [provider]  docusate sodium (COLACE) 100 MG capsule Take 1 capsule (100 mg total) by mouth every 12 (twelve) hours. 10/24/16  Yes Harris, Abigail, PA-C  fluticasone (FLONASE) 50 MCG/ACT nasal spray Place 2 sprays into both nostrils daily. Patient taking differently: Place 2 sprays into both nostrils daily as needed for allergies.  02/04/17  Yes Langeland, Dawn T, MD  furosemide (LASIX) 40 MG tablet Take 1 tablet (40 mg total) by mouth daily. Take extra 40 mg tablet once in the afternoon AS NEEDED for weight gain 3 lbs or more. 09/17/18  Yes Ladell Pier, MD  gabapentin (NEURONTIN) 300 MG capsule Take  600 mg by mouth 3 (three) times daily.   Yes [provider]  LORazepam (ATIVAN) 0.5 MG tablet Take 0.5 mg by mouth every 8 (eight) hours as needed for anxiety or sleep.   Yes [provider]  meclizine (ANTIVERT) 25 MG tablet Take 25 mg by mouth 2 (two) times daily as needed for dizziness.   Yes [provider]  Melatonin 5 MG TABS Take 5 mg by mouth at bedtime.   Yes [provider]  mirtazapine (REMERON) 15 MG tablet Take 15 mg by mouth at bedtime. Total dosage is 60 mg   Yes [provider]  mirtazapine (REMERON) 45 MG tablet Take 45 mg by mouth at bedtime. Total dosage is 60 mg  Yes [provider]  montelukast (SINGULAIR) 10 MG tablet Take 10 mg by mouth every evening. 02/21/20  Yes [provider]  omeprazole (PRILOSEC) 40 MG capsule Take 40 mg by mouth 2 (two) times daily. 02/01/20  Yes [provider]  polyethylene glycol powder (GLYCOLAX/MIRALAX) powder Take 17 g by mouth 2 (two) times daily as needed. Patient taking differently: Take 17 g by mouth 2 (two) times daily as needed for mild constipation.  06/03/18  Yes McClung, Dionne Bucy, PA-C  Riociguat (ADEMPAS) 2 MG TABS Take 2 mg by mouth 3 (three) times daily.    Yes [provider]  spironolactone (ALDACTONE) 25 MG tablet Take 12.5 mg by mouth every other day.   Yes [provider]  traZODone (DESYREL) 150 MG tablet Take 150 mg by mouth at bedtime as needed for sleep. 01/10/20  Yes [provider]  XARELTO 20 MG TABS tablet TAKE 1 TABLET BY MOUTH ONCE DAILY WITH SUPPER Patient taking differently: Take 20 mg by mouth daily with supper.  11/23/19  Yes Mannam, Praveen, MD  methocarbamol (ROBAXIN) 500 MG tablet Take 1 tablet (500 mg total) by mouth 2 (two) times daily as needed. Patient not taking: Reported on 02/25/2020 12/14/19   Aundra Dubin, PA-C  traMADol Veatrice Bourbon) 50 MG tablet Take 1-2 tabs po bid prn pain Patient not taking: Reported on  02/25/2020 10/27/19   Aundra Dubin, PA-C    Physical Exam: Vitals:   02/25/20 1232 02/25/20 1257 02/25/20 1257 02/25/20 1331  BP: 117/68   121/73  Pulse: 99 95  94  Resp: 18 17  20   Temp:   (!) 101.2 F (38.4 C)   TempSrc:   Oral   SpO2: 99% 100%  98%  Weight:      Height:        Constitutional: NAD, calm, comfortable Vitals:   02/25/20 1232 02/25/20 1257 02/25/20 1257 02/25/20 1331  BP: 117/68   121/73  Pulse: 99 95  94  Resp: 18 17  20   Temp:   (!) 101.2 F (38.4 C)   TempSrc:   Oral   SpO2: 99% 100%  98%  Weight:      Height:       Eyes: PERRL, lids and conjunctivae normal ENMT: Mucous membranes are moist. Posterior pharynx clear of any exudate or lesions.Normal dentition.  Neck: normal, supple, no masses, no thyromegaly Respiratory: clear to auscultation bilaterally, no wheezing, no crackles. Normal respiratory effort. No accessory muscle use.  Cardiovascular: Regular rate and rhythm, no murmurs / rubs / gallops. No extremity edema. 2+ pedal pulses. No carotid bruits.  Abdomen: mild tenderness on epigastric area no rebound no guarding, no masses palpated. No hepatosplenomegaly. Bowel sounds positive.  Musculoskeletal: no clubbing / cyanosis. No joint deformity upper and lower extremities. Good ROM, no contractures. Normal muscle tone.  Skin: no rashes, lesions, ulcers. No induration Neurologic: CN 2-12 grossly intact. Sensation intact, DTR normal. Strength 5/5 in all 4.  Psychiatric: Normal judgment and insight. Alert and oriented x 3. Normal mood.     Labs on Admission: I have personally reviewed following labs and imaging studies  CBC: Recent Labs  Lab 02/24/20 1826  WBC 8.6  HGB 12.6  HCT 40.5  MCV 68.9*  PLT 702   Basic Metabolic Panel: Recent Labs  Lab 02/24/20 1826  NA 141  K 3.7  CL 107  CO2 24  GLUCOSE 100*  BUN 8  CREATININE 1.00  CALCIUM 9.6   GFR:  Estimated Creatinine Clearance: 90 mL/min (by C-G formula based on SCr of 1  mg/dL). Liver Function Tests: Recent Labs  Lab 02/24/20 1826  AST 17  ALT 10  ALKPHOS 75  BILITOT 0.6  PROT 7.2  ALBUMIN 3.8   Recent Labs  Lab 02/25/20 0830  LIPASE 23   No results for input(s): AMMONIA in the last 168 hours. Coagulation Profile: No results for input(s): INR, PROTIME in the last 168 hours. Cardiac Enzymes: No results for input(s): CKTOTAL, CKMB, CKMBINDEX, TROPONINI in the last 168 hours. BNP (last 3 results) No results for input(s): PROBNP in the last 8760 hours. HbA1C: No results for input(s): HGBA1C in the last 72 hours. CBG: No results for input(s): GLUCAP in the last 168 hours. Lipid Profile: No results for input(s): CHOL, HDL, LDLCALC, TRIG, CHOLHDL, LDLDIRECT in the last 72 hours. Thyroid Function Tests: No results for input(s): TSH, T4TOTAL, FREET4, T3FREE, THYROIDAB in the last 72 hours. Anemia Panel: No results for input(s): VITAMINB12, FOLATE, FERRITIN, TIBC, IRON, RETICCTPCT in the last 72 hours. Urine analysis:    Component Value Date/Time   COLORURINE YELLOW 02/25/2020 0811   APPEARANCEUR HAZY (A) 02/25/2020 0811   LABSPEC 1.019 02/25/2020 0811   PHURINE 6.0 02/25/2020 0811   GLUCOSEU NEGATIVE 02/25/2020 0811   HGBUR SMALL (A) 02/25/2020 0811   BILIRUBINUR NEGATIVE 02/25/2020 0811   BILIRUBINUR negative 06/06/2017 1346   KETONESUR NEGATIVE 02/25/2020 0811   PROTEINUR 30 (A) 02/25/2020 0811   UROBILINOGEN 0.2 06/06/2017 1346   NITRITE NEGATIVE 02/25/2020 0811   LEUKOCYTESUR SMALL (A) 02/25/2020 0811    Radiological Exams on Admission: CT ABDOMEN PELVIS W CONTRAST  Result Date: 02/25/2020 CLINICAL DATA:  59 year old female with history of right upper quadrant abdominal pain and nausea. Diffuse lower abdominal pain. Loss of appetite for 1 week. EXAM: CT ABDOMEN AND PELVIS WITH CONTRAST TECHNIQUE: Multidetector CT imaging of the abdomen and pelvis was performed using the standard protocol following bolus administration of intravenous  contrast. CONTRAST:  112mL OMNIPAQUE IOHEXOL 300 MG/ML  SOLN COMPARISON:  CT the abdomen and pelvis 10/24/2016. FINDINGS: Lower chest: Linear scarring in the lower lobes of the lungs bilaterally. Hepatobiliary: No suspicious cystic or solid hepatic lesions. No intra or extrahepatic biliary ductal dilatation. Status post cholecystectomy. Pancreas: No pancreatic mass. No pancreatic ductal dilatation. No pancreatic or peripancreatic fluid collections or inflammatory changes. Spleen: Unremarkable. Adrenals/Urinary Tract: 2.5 x 1.5 cm right adrenal nodule, indeterminate. Left adrenal gland and right kidney are normal in appearance. Subcentimeter low-attenuation lesion in the interpolar region of the left kidney, too small to characterize, but statistically likely to represent a cyst. No hydroureteronephrosis. Urinary bladder is normal in appearance. Stomach/Bowel: Normal appearance of the stomach. No pathologic dilatation of small bowel or colon. Normal appendix. Vascular/Lymphatic: Aortic atherosclerosis. No lymphadenopathy noted in the abdomen or pelvis. IVC filter in position with tip terminating immediately below the level of the renal veins. No lymphadenopathy noted in the abdomen or pelvis. Reproductive: Status post hysterectomy. Ovaries are not confidently identified may be surgically absent or atrophic. Other: No significant volume of ascites.  No pneumoperitoneum. Musculoskeletal: There are no aggressive appearing lytic or blastic lesions noted in the visualized portions of the skeleton. IMPRESSION: 1. No acute findings are noted in the abdomen or pelvis to account for the patient's symptoms. 2. Status post cholecystectomy. 3. Indeterminate right adrenal nodule measuring 2.5 x 1.5 cm, similar to remote prior study from 10/24/2016, likely to represent a benign lesion such as an adenoma. 4. Aortic  atherosclerosis. 5. Additional incidental findings, as above. Electronically Signed   By: Vinnie Langton M.D.   On:  02/25/2020 12:41   DG Chest Portable 1 View  Result Date: 02/25/2020 CLINICAL DATA:  59 year old female with history of cough, shortness of breath and chest pain. EXAM: PORTABLE CHEST 1 VIEW COMPARISON:  Chest x-ray 09/28/2019. FINDINGS: Right subclavian single-lumen porta cath with tip terminating at the superior cavoatrial junction lung volumes are slightly low. Mild elevation of the right hemidiaphragm is unchanged. No acute consolidative airspace disease. No pleural effusions. No evidence of pulmonary edema. No pneumothorax. No definite suspicious appearing pulmonary nodules or masses are noted. Heart size is upper limits of normal. Upper mediastinal contours are within normal limits. Aortic atherosclerosis. Status post median sternotomy. IMPRESSION: 1. No radiographic evidence of acute cardiopulmonary disease. 2. Aortic atherosclerosis. Electronically Signed   By: Vinnie Langton M.D.   On: 02/25/2020 08:32    EKG: Independently reviewed. Chronic RBBB  Assessment/Plan Active Problems:   Lower GI bleed  (please populate well all problems here in Problem List. (For example, if patient is on BP meds at home and you resume or decide to hold them, it is a problem that needs to be her. Same for CAD, COPD, HLD and so on)  Impending sepsis -She has fever and tachycardia, BP borderline low. -Source considered to be UTI, she had multiple UTI before treated with pills, no history of resistant organism UTI before.  Rocephin started in the ED, urine culture pending  Melena -Overall suspect any upper GI bleed although BUN level is low and hemoglobin 12 is around her baseline.  Discussed with on-call [facility] Dr. Therisa Doyne, start PPI IV twice daily -Patient claims he was scoped EGD, colonoscopy and capsule endoscopy in 2019 before Covid lockdown and was found to have a ulcer. -Repeat H&H tonight, transfuse if patient becomes symptomatic or significant drop of H&H. -Hold Xarelto for now until her H&H is  stable >24hours  Acute on chronic chest pain -Similar pattern as before, she has been following with cardiologist for similar recurrent chest pain, most recent office visit was in December 2020, when cardiology considered the chest pain related to her pulmonary hypertension which is chronic.  She was cathed in 2017 at [facility] showed nonobstructive CAD, and she was diagnosed with PE status post embolectomy at the same time which was treated with systemic anticoagulation as well as IVC filter at the same time in 2017.  History of PE status post embolectomy and IVC filter in 2017 -Given the questionable recurrence of GI bleeding, will hold anticoagulation for now, start DVT prophylaxis dosage of Lovenox  History of diastolic CHF chronic -Hold CHF medication for now until her BP stabilized  Pulm HTN -Continue home meds  Morbid obesity -Outpatient bariatric evaluation   DVT prophylaxis: Lovenox Code Status: Full Family Communication: None at bedside Disposition Plan: Depends on GI work-up and progress of sepsis/UTI, expect 2 to 3 days hospital stay. Consults called: GI Dr. Therisa Doyne Admission status: Telemetry admission   Lequita Halt MD [facility] Pager [phone]    02/25/2020, 2:37 PM

## 2020-02-25 NOTE — Progress Notes (Signed)
   02/25/20 2211  Vitals  Temp 98.6 F (37 C)  Temp Source Oral  BP 131/86  MAP (mmHg) 100  BP Location Right Arm  BP Method Automatic  Patient Position (if appropriate) Lying  Pulse Rate Source Dinamap  Resp (!) 22  Level of Consciousness  Level of Consciousness Alert  Oxygen Therapy  SpO2 100 %  O2 Device Nasal Cannula  O2 Flow Rate (L/min) 1.5 L/min  MEWS Score  MEWS Temp 0  MEWS Systolic 0  MEWS Pulse 0  MEWS RR 1  MEWS LOC 0  MEWS Score 1  MEWS Score Color Green  Pt had an episode of wide qrs svt reaching a heart rate of 150 but not sustaining. Vs as above. MD notified

## 2020-02-25 NOTE — ED Provider Notes (Signed)
[facility] EMERGENCY DEPARTMENT Provider Note   CSN: [id number] Arrival date & time: 02/24/20  1818     History Chief Complaint  Patient presents with  . GI Bleeding    [name] [name] is a 59 y.o. female.  HPI   Pt started having abdominal pain on Tuesday.  Since then the pain has been increasing.  The pain is in her upper abdomen.  She has noticed dark black sticky stools.  Pt does take xarelto.  Pt had a cholecystectomy in the past.  Pt has been nauseated but no vomiting.  Stools have been loose.   2-3 times per day.   Past Medical History:  Diagnosis Date  . Anxiety   . Arthritis   . CHF exacerbation ([place]) 08/21/2017  . Depression   . Difficult intubation    See [facility] anesthesia records in [place]  . Diverticulitis   . Dyspnea    with exertion  . Family history of adverse reaction to anesthesia    " My mother stopped breathing during procedure"  . GERD (gastroesophageal reflux disease)    PMH; resolved  . Hyperlipidemia   . Hypertension   . Obesity   . Plantar fasciitis, left   . Pneumonia   . Port-A-Cath in place   . Pre-diabetes   . Pulmonary embolism ([place])   . Pulmonary hypertension ([place])   . Sleep apnea    wears CPAP # 9  . Wears glasses     Patient Active Problem List   Diagnosis Date Noted  . Lower GI bleed 02/25/2020  . Pyelonephritis   . Primary osteoarthritis of right knee 09/30/2019  . Achilles tendon contracture, left   . Leg pain, left 07/10/2018  . Bilateral primary osteoarthritis of knee 05/12/2018  . Plantar fasciitis of left foot 05/12/2018  . Shortness of breath 01/16/2018  . Palliative care encounter 01/16/2018  . Major depressive disorder, recurrent severe without psychotic features ([place]) 12/06/2017  . CHF exacerbation ([place]) 08/21/2017  . (HFpEF) heart failure with preserved ejection fraction ([place]) 08/20/2017  . Lumbar radiculopathy 08/08/2017  . Body mass index 50.0-59.9, adult ([facility]) 08/08/2017  .  Hoarseness 08/08/2017  . Chronic cough 04/10/2017  . Rheumatoid arthritis involving multiple sites ([place]) 04/10/2017  . Anxiety and depression 04/10/2017  . Adjustment disorder with depressed mood 12/11/2016  . Morbid obesity ([place]) 08/14/2016  . Stroke ([place]) 06/27/2016  . Right sided weakness 06/27/2016  . Essential hypertension 06/27/2016  . Atypical chest pain 06/27/2016  . Difficult airway 03/29/2016  . CTEPH (chronic thromboembolic pulmonary hypertension) ([place]) 03/14/2016  . OSA on CPAP 02/20/2016  . Chronic pain syndrome 01/31/2016  . Pulmonary embolus ([place]) 11/15/2015  . Vertigo 11/15/2015  . Depression 11/15/2015    Past Surgical History:  Procedure Laterality Date  . ABDOMINAL HYSTERECTOMY    . BREAST REDUCTION SURGERY    . CHOLECYSTECTOMY    . COLONOSCOPY W/ BIOPSIES AND POLYPECTOMY    . EMBOLECTOMY N/A    pulmonary embolectomy  . FRACTURE SURGERY     right ankle  . GASTROCNEMIUS RECESSION Left 02/12/2019   Procedure: LEFT GASTROCNEMIUS RECESSION, PLANTAR FASCIA RELEASE;  Surgeon: Newt Minion, MD;  Location: [facility];  Service: Orthopedics;  Laterality: Left;  . IVC FILTER INSERTION    . REDUCTION MAMMAPLASTY Bilateral   . RIGHT HEART CATH N/A 10/28/2016   Procedure: Right Heart Cath;  Surgeon: Jolaine Artist, MD;  Location: [facility];  Service: Cardiovascular;  Laterality: N/A;  .  RIGHT HEART CATH N/A 08/26/2017   Procedure: RIGHT HEART CATH;  Surgeon: Jolaine Artist, MD;  Location: [facility];  Service: Cardiovascular;  Laterality: N/A;  . RIGHT HEART CATH N/A 07/05/2019   Procedure: RIGHT HEART CATH;  Surgeon: Jolaine Artist, MD;  Location: [facility];  Service: Cardiovascular;  Laterality: N/A;  . ULTRASOUND GUIDANCE FOR VASCULAR ACCESS  08/26/2017   Procedure: Ultrasound Guidance For Vascular Access;  Surgeon: Jolaine Artist, MD;  Location: [facility];  Service: Cardiovascular;;  . WISDOM TOOTH EXTRACTION        OB History   No obstetric history on file.     Family History  Problem Relation Age of Onset  . Lung cancer Maternal Grandmother   . Dementia Mother   . Dementia Father   . Anxiety disorder Sister   . Bipolar disorder Sister   . Drug abuse Sister   . Sexual abuse Maternal Aunt   . Anxiety disorder Cousin   . Anxiety disorder Sister   . Bipolar disorder Sister   . Drug abuse Sister   . Breast cancer Neg Hx     Social History   Tobacco Use  . Smoking status: Never Smoker  . Smokeless tobacco: Never Used  Vaping Use  . Vaping Use: Never used  Substance Use Topics  . Alcohol use: No  . Drug use: No    Home Medications Prior to Admission medications   Medication Sig Start Date End Date Taking? Authorizing Provider  acetaminophen-codeine (TYLENOL #4) 300-60 MG tablet Take 1 tablet by mouth every 4 (four) hours as needed for pain.   Yes [provider]  albuterol (VENTOLIN HFA) 108 (90 Base) MCG/ACT inhaler Inhale 2 puffs into the lungs every 6 (six) hours as needed for wheezing or shortness of breath. 11/24/19  Yes [provider]  atorvastatin (LIPITOR) 20 MG tablet Take 1 tablet (20 mg total) by mouth daily at 6 PM. 09/17/18  Yes Ladell Pier, MD  cyclobenzaprine (FLEXERIL) 10 MG tablet Take 10 mg by mouth 3 (three) times daily as needed for muscle spasms. 02/18/20  Yes [provider]  diclofenac sodium (VOLTAREN) 1 % GEL Apply 2 g topically 4 (four) times daily as needed (knee and back pain).   Yes [provider]  dicyclomine (BENTYL) 10 MG capsule Take 10 mg by mouth 3 (three) times daily as needed. Stomach cramps 02/06/20  Yes [provider]  docusate sodium (COLACE) 100 MG capsule Take 1 capsule (100 mg total) by mouth every 12 (twelve) hours. 10/24/16  Yes Harris, Abigail, PA-C  fluticasone (FLONASE) 50 MCG/ACT nasal spray Place 2 sprays into both nostrils daily. Patient taking differently: Place 2 sprays into both  nostrils daily as needed for allergies.  02/04/17  Yes Langeland, Dawn T, MD  furosemide (LASIX) 40 MG tablet Take 1 tablet (40 mg total) by mouth daily. Take extra 40 mg tablet once in the afternoon AS NEEDED for weight gain 3 lbs or more. 09/17/18  Yes Ladell Pier, MD  gabapentin (NEURONTIN) 300 MG capsule Take 600 mg by mouth 3 (three) times daily.   Yes [provider]  LORazepam (ATIVAN) 0.5 MG tablet Take 0.5 mg by mouth every 8 (eight) hours as needed for anxiety or sleep.   Yes [provider]  meclizine (ANTIVERT) 25 MG tablet Take 25 mg by mouth 2 (two) times daily as needed for dizziness.   Yes [provider]  Melatonin 5 MG TABS Take 5 mg by mouth at bedtime.   Yes [provider]  mirtazapine (REMERON) 15 MG tablet Take 15 mg by mouth at bedtime. Total dosage is 60 mg   Yes [provider]  mirtazapine (REMERON) 45 MG tablet Take 45 mg by mouth at bedtime. Total dosage is 60 mg   Yes [provider]  montelukast (SINGULAIR) 10 MG tablet Take 10 mg by mouth every evening. 02/21/20  Yes [provider]  omeprazole (PRILOSEC) 40 MG capsule Take 40 mg by mouth 2 (two) times daily. 02/01/20  Yes [provider]  polyethylene glycol powder (GLYCOLAX/MIRALAX) powder Take 17 g by mouth 2 (two) times daily as needed. Patient taking differently: Take 17 g by mouth 2 (two) times daily as needed for mild constipation.  06/03/18  Yes McClung, Dionne Bucy, PA-C  Riociguat (ADEMPAS) 2 MG TABS Take 2 mg by mouth 3 (three) times daily.    Yes [provider]  spironolactone (ALDACTONE) 25 MG tablet Take 12.5 mg by mouth every other day.   Yes [provider]  traZODone (DESYREL) 150 MG tablet Take 150 mg by mouth at bedtime as needed for sleep. 01/10/20  Yes [provider]  XARELTO 20 MG TABS tablet TAKE 1 TABLET BY MOUTH ONCE DAILY WITH SUPPER Patient taking differently: Take 20 mg by mouth daily with  supper.  11/23/19  Yes Mannam, Praveen, MD  methocarbamol (ROBAXIN) 500 MG tablet Take 1 tablet (500 mg total) by mouth 2 (two) times daily as needed. Patient not taking: Reported on 02/25/2020 12/14/19   Aundra Dubin, PA-C  traMADol Veatrice Bourbon) 50 MG tablet Take 1-2 tabs po bid prn pain Patient not taking: Reported on 02/25/2020 10/27/19   Aundra Dubin, PA-C    Allergies    [name].  Review of Systems   Review of Systems  Respiratory: Positive for shortness of breath.        Chronic  Cardiovascular: Positive for chest pain.       Right side lower  All other systems reviewed and are negative.   Physical Exam Updated Vital Signs BP (!) 102/53   Pulse 93   Temp (!) 101.2 F (38.4 C) (Oral)   Resp 18   Ht 1.6 m (5\' 3" )   Wt (!) 151 kg   SpO2 99%   BMI 58.99 kg/m   Physical Exam Vitals and nursing note reviewed.  Constitutional:      General: She is not in acute distress.    Appearance: She is well-developed. She is obese.  HENT:     Head: Normocephalic and atraumatic.     Right Ear: External ear normal.     Left Ear: External ear normal.  Eyes:     General: No scleral icterus.       Right eye: No discharge.        Left eye: No discharge.     Conjunctiva/sclera: Conjunctivae normal.  Neck:     Trachea: No tracheal deviation.  Cardiovascular:     Rate and Rhythm: Normal rate and regular rhythm.  Pulmonary:     Effort: Pulmonary effort is normal. No respiratory distress.     Breath sounds: Normal breath sounds. No stridor. No wheezing or rales.  Abdominal:     General: Bowel sounds are normal. There is no distension.     Palpations: Abdomen is soft.     Tenderness: There is generalized abdominal tenderness. There is  no guarding or rebound.  Musculoskeletal:        General: No tenderness.     Cervical back: Neck supple.  Skin:    General: Skin is warm and dry.     Findings: No rash.  Neurological:     Mental Status: She is alert.      Cranial Nerves: No cranial nerve deficit (no facial droop, extraocular movements intact, no slurred speech).     Sensory: No sensory deficit.     Motor: No abnormal muscle tone or seizure activity.     Coordination: Coordination normal.     ED Results / Procedures / Treatments   Labs (all labs ordered are listed, but only abnormal results are displayed) Labs Reviewed  COMPREHENSIVE METABOLIC PANEL - Abnormal; Notable for the following components:      Result Value   Glucose, Bld 100 (*)    All other components within normal limits  CBC - Abnormal; Notable for the following components:   RBC 5.88 (*)    MCV 68.9 (*)    MCH 21.4 (*)    RDW 18.6 (*)    All other components within normal limits  URINALYSIS, ROUTINE W REFLEX MICROSCOPIC - Abnormal; Notable for the following components:   APPearance HAZY (*)    Hgb urine dipstick SMALL (*)    Protein, ur 30 (*)    Leukocytes,Ua SMALL (*)    Bacteria, UA FEW (*)    All other components within normal limits  SARS CORONAVIRUS 2 BY RT PCR (HOSPITAL ORDER, [facility])  URINE CULTURE  CULTURE, BLOOD (ROUTINE X 2)  CULTURE, BLOOD (ROUTINE X 2)  LIPASE, BLOOD  LACTIC ACID, PLASMA  HIV ANTIBODY (ROUTINE TESTING W REFLEX)  HEMOGLOBIN AND HEMATOCRIT, BLOOD  POC OCCULT BLOOD, ED  TYPE AND SCREEN    EKG EKG Interpretation  Date/Time:  Friday February 25 2020 07:54:49 EDT Ventricular Rate:  95 PR Interval:    QRS Duration: 136 QT Interval:  384 QTC Calculation: 483 R Axis:   8 Text Interpretation: Sinus rhythm IVCD, consider atypical RBBB Nonspecific T abnormalities, lateral leads No significant change since last tracing Confirmed by Dorie Rank [phone]) on 02/25/2020 8:09:13 AM   Radiology CT ABDOMEN PELVIS W CONTRAST  Result Date: 02/25/2020 CLINICAL DATA:  59 year old female with history of right upper quadrant abdominal pain and nausea. Diffuse lower abdominal pain. Loss of appetite for 1 week. EXAM: CT  ABDOMEN AND PELVIS WITH CONTRAST TECHNIQUE: Multidetector CT imaging of the abdomen and pelvis was performed using the standard protocol following bolus administration of intravenous contrast. CONTRAST:  146mL OMNIPAQUE IOHEXOL 300 MG/ML  SOLN COMPARISON:  CT the abdomen and pelvis 10/24/2016. FINDINGS: Lower chest: Linear scarring in the lower lobes of the lungs bilaterally. Hepatobiliary: No suspicious cystic or solid hepatic lesions. No intra or extrahepatic biliary ductal dilatation. Status post cholecystectomy. Pancreas: No pancreatic mass. No pancreatic ductal dilatation. No pancreatic or peripancreatic fluid collections or inflammatory changes. Spleen: Unremarkable. Adrenals/Urinary Tract: 2.5 x 1.5 cm right adrenal nodule, indeterminate. Left adrenal gland and right kidney are normal in appearance. Subcentimeter low-attenuation lesion in the interpolar region of the left kidney, too small to characterize, but statistically likely to represent a cyst. No hydroureteronephrosis. Urinary bladder is normal in appearance. Stomach/Bowel: Normal appearance of the stomach. No pathologic dilatation of small bowel or colon. Normal appendix. Vascular/Lymphatic: Aortic atherosclerosis. No lymphadenopathy noted in the abdomen or pelvis. IVC filter in position with tip terminating immediately below the  level of the renal veins. No lymphadenopathy noted in the abdomen or pelvis. Reproductive: Status post hysterectomy. Ovaries are not confidently identified may be surgically absent or atrophic. Other: No significant volume of ascites.  No pneumoperitoneum. Musculoskeletal: There are no aggressive appearing lytic or blastic lesions noted in the visualized portions of the skeleton. IMPRESSION: 1. No acute findings are noted in the abdomen or pelvis to account for the patient's symptoms. 2. Status post cholecystectomy. 3. Indeterminate right adrenal nodule measuring 2.5 x 1.5 cm, similar to remote prior study from 10/24/2016,  likely to represent a benign lesion such as an adenoma. 4. Aortic atherosclerosis. 5. Additional incidental findings, as above. Electronically Signed   By: Vinnie Langton M.D.   On: 02/25/2020 12:41   DG Chest Portable 1 View  Result Date: 02/25/2020 CLINICAL DATA:  59 year old female with history of cough, shortness of breath and chest pain. EXAM: PORTABLE CHEST 1 VIEW COMPARISON:  Chest x-ray 09/28/2019. FINDINGS: Right subclavian single-lumen porta cath with tip terminating at the superior cavoatrial junction lung volumes are slightly low. Mild elevation of the right hemidiaphragm is unchanged. No acute consolidative airspace disease. No pleural effusions. No evidence of pulmonary edema. No pneumothorax. No definite suspicious appearing pulmonary nodules or masses are noted. Heart size is upper limits of normal. Upper mediastinal contours are within normal limits. Aortic atherosclerosis. Status post median sternotomy. IMPRESSION: 1. No radiographic evidence of acute cardiopulmonary disease. 2. Aortic atherosclerosis. Electronically Signed   By: Vinnie Langton M.D.   On: 02/25/2020 08:32    Procedures .Critical Care Performed by: Dorie Rank, MD Authorized by: Dorie Rank, MD   Critical care provider statement:    Critical care time (minutes):  45   Critical care was time spent personally by me on the following activities:  Discussions with consultants, evaluation of patient's response to treatment, examination of patient, ordering and performing treatments and interventions, ordering and review of laboratory studies, ordering and review of radiographic studies, pulse oximetry, re-evaluation of patient's condition, obtaining history from patient or surrogate and review of old charts   (including critical care time)  Medications Ordered in ED Medications  sodium chloride flush (NS) 0.9 % injection 10-40 mL (has no administration in time range)  Chlorhexidine Gluconate Cloth 2 % PADS 6 each  (has no administration in time range)  sodium chloride 0.9 % bolus 1,000 mL (0 mLs Intravenous Stopped 02/25/20 1259)    Followed by  0.9 %  sodium chloride infusion (1,000 mLs Intravenous New Bag/Given 02/25/20 1216)  acetaminophen (TYLENOL) tablet 650 mg (650 mg Oral Not Given 02/25/20 1032)  acetaminophen-codeine (TYLENOL #4) 300-60 MG per tablet 1 tablet (has no administration in time range)  atorvastatin (LIPITOR) tablet 20 mg (has no administration in time range)  Riociguat TABS 2 mg (has no administration in time range)  LORazepam (ATIVAN) tablet 0.5 mg (has no administration in time range)  mirtazapine (REMERON) tablet 60 mg (has no administration in time range)  traZODone (DESYREL) tablet 150 mg (has no administration in time range)  dicyclomine (BENTYL) capsule 10 mg (has no administration in time range)  docusate sodium (COLACE) capsule 100 mg (has no administration in time range)  meclizine (ANTIVERT) tablet 25 mg (has no administration in time range)  melatonin tablet 5 mg (has no administration in time range)  cyclobenzaprine (FLEXERIL) tablet 10 mg (has no administration in time range)  gabapentin (NEURONTIN) capsule 600 mg (has no administration in time range)  albuterol (PROVENTIL) (2.5 MG/3ML) 0.083% nebulizer solution  3 mL (has no administration in time range)  fluticasone (FLONASE) 50 MCG/ACT nasal spray 2 spray (has no administration in time range)  montelukast (SINGULAIR) tablet 10 mg (has no administration in time range)  diclofenac sodium (VOLTAREN) 1 % transdermal gel 2 g (has no administration in time range)  pantoprazole (PROTONIX) injection 40 mg (has no administration in time range)  enoxaparin (LOVENOX) injection 40 mg (has no administration in time range)  acetaminophen (TYLENOL) tablet 650 mg (has no administration in time range)    Or  acetaminophen (TYLENOL) suppository 650 mg (has no administration in time range)  HYDROmorphone (DILAUDID) injection 0.5-1 mg  (1 mg Intravenous Given 02/25/20 1535)  cefTRIAXone (ROCEPHIN) 1 g in sodium chloride 0.9 % 100 mL IVPB (has no administration in time range)  polyethylene glycol (MIRALAX / GLYCOLAX) packet 17 g (has no administration in time range)  morphine 4 MG/ML injection 4 mg (4 mg Intravenous Given 02/25/20 0902)  ondansetron (ZOFRAN) injection 4 mg (4 mg Intravenous Given 02/25/20 0901)  cefTRIAXone (ROCEPHIN) 1 g in sodium chloride 0.9 % 100 mL IVPB (0 g Intravenous Stopped 02/25/20 1117)  acetaminophen (TYLENOL) tablet 650 mg (650 mg Oral Given 02/25/20 1031)  iohexol (OMNIPAQUE) 300 MG/ML solution 100 mL (100 mLs Intravenous Contrast Given 02/25/20 1208)  morphine 4 MG/ML injection 4 mg (4 mg Intravenous Given 02/25/20 1259)  ondansetron (ZOFRAN) injection 4 mg (4 mg Intravenous Given 02/25/20 1258)    ED Course  I have reviewed the triage vital signs and the nursing notes.  Pertinent labs & imaging results that were available during my care of the patient were reviewed by me and considered in my medical decision making (see chart for details).  Clinical Course as of Feb 24 1557  Fri Feb 25, 2020  1018 Notified that patient is more tachycardic.  She has had shaking chills now.  Repeat temperature does show temperature of 100.1.  Urinalysis does suggest infection.  We will start the patient on antibiotics check lactic acid level.  CT scan is pending.   [JK]  1226 Patient is requesting additional pain medications   [JK]  1322 CT scan does not show any acute findings.   [JK]  1322 Abnormal urinalysis and fevers consistent with pyelonephritis.  I will consult the medical service for admission.   [JK]    Clinical Course User Index [JK] Dorie Rank, MD   MDM Rules/Calculators/A&P                          Patient presented to ED with complaints of abdominal pain.  Patient was concerned she might have a GI bleed as she noticed some dark stools.  Patient's hemoglobin was stable and her guaiac was  negative.  While she was in the ED the patient developed a fever.  CT scan was performed and there are no findings to suggest diverticulitis colitis.  Patient's urinalysis is consistent with a urinary tract infection.  She is Covid negative.  I suspect she is having pyelonephritis.  Considering her persistent fever and multiple comorbidities I think it is reasonable to bring her in for IV antibiotics.  Medical service consulted for admission and further treatment. Final Clinical Impression(s) / ED Diagnoses Final diagnoses:  Pyelonephritis      Dorie Rank, MD 02/25/20 1600

## 2020-02-25 NOTE — H&P (View-Only) (Signed)
Referring Provider: [facility] Primary Care Physician:  Ruthine Dose, MD Primary Gastroenterologist:  Dr. Alessandra Bevels  Reason for Consultation:  melena, epigastric abdominal pain  HPI: [name] [name] is a 59 y.o. female with history of gastritis, GERD, cholecystectomy, pulmonary embolism (on Xarelto), CHF (EF 60-65% as of 05/2019), morbid obesity (BMI 58.99), and OSA (on CPAP) admitted today with possible sepsis presenting with epigastric pain and "melena".  Patient reports she has been feeling unwell since Tuesday 6/15.  She has had epigastric abdominal pain, chest pain, dry heaving, and "black, sticky" stools.  She has been having approximately 2 soft "melenic" stools per day since 6/15.  Prior to this, she had 1 brown, formed bowel movement daily to every other day.    She has also had a decreased appetite and decreased oral intake since Tuesday and thinks she has lost about 5 pounds.  She denies any vomiting or hematemesis.  Denies dysphagia but reports chronic reflux.  She denies diarrhea, constipation, and hematemesis.  She was febrile to 101.27F this morning and has also been having bilateral lower quadrant abdominal pain, lower back pain, and dark and malodorous urine and was suspected to have a UTI/pyelonephritis.  UA showed small leukocytes, few bacteria, and small Hgb.  She takes Xarelto, last dose Wednesday 6/16.  She denies any aspirin or NSAID use.  Patient denies any family history of colon cancer or gastrointestinal malignancies, though she states her father had "stomach problems."  CT today 6/18: No acute findings in the abdomen or pelvis  EGD 08/15/2014-Impression: Gastritis, GERD, hiatal hernia Colonoscopy 08/15/2014: normal Virtual colonoscopy 03/22/2019: unremarkable  Past Medical History:  Diagnosis Date   Anxiety    Arthritis    CHF exacerbation ([place]) 08/21/2017   Depression    Difficult intubation    See [facility] anesthesia records in Care Everywhere    Diverticulitis    Dyspnea    with exertion   Family history of adverse reaction to anesthesia    " My mother stopped breathing during procedure"   GERD (gastroesophageal reflux disease)    PMH; resolved   Hyperlipidemia    Hypertension    Obesity    Plantar fasciitis, left    Pneumonia    Port-A-Cath in place    Pre-diabetes    Pulmonary embolism ([place])    Pulmonary hypertension ([place])    Sleep apnea    wears CPAP # 9   Wears glasses     Past Surgical History:  Procedure Laterality Date   ABDOMINAL HYSTERECTOMY     BREAST REDUCTION SURGERY     CHOLECYSTECTOMY     COLONOSCOPY W/ BIOPSIES AND POLYPECTOMY     EMBOLECTOMY N/A    pulmonary embolectomy   FRACTURE SURGERY     right ankle   GASTROCNEMIUS RECESSION Left 02/12/2019   Procedure: LEFT GASTROCNEMIUS RECESSION, PLANTAR FASCIA RELEASE;  Surgeon: Newt Minion, MD;  Location: [facility];  Service: Orthopedics;  Laterality: Left;   IVC FILTER INSERTION     REDUCTION MAMMAPLASTY Bilateral    RIGHT HEART CATH N/A 10/28/2016   Procedure: Right Heart Cath;  Surgeon: Jolaine Artist, MD;  Location: [facility];  Service: Cardiovascular;  Laterality: N/A;   RIGHT HEART CATH N/A 08/26/2017   Procedure: RIGHT HEART CATH;  Surgeon: Jolaine Artist, MD;  Location: [facility];  Service: Cardiovascular;  Laterality: N/A;   RIGHT HEART CATH N/A 07/05/2019   Procedure: RIGHT HEART CATH;  Surgeon: Jolaine Artist, MD;  Location: [facility];  Service: Cardiovascular;  Laterality: N/A;   ULTRASOUND GUIDANCE FOR VASCULAR ACCESS  08/26/2017   Procedure: Ultrasound Guidance For Vascular Access;  Surgeon: Jolaine Artist, MD;  Location: [facility];  Service: Cardiovascular;;   WISDOM TOOTH EXTRACTION      Prior to Admission medications   Medication Sig Start Date End Date Taking? Authorizing Provider  acetaminophen-codeine (TYLENOL #4) 300-60 MG tablet Take 1 tablet by mouth every 4 (four) hours as  needed for pain.   Yes [provider]  albuterol (VENTOLIN HFA) 108 (90 Base) MCG/ACT inhaler Inhale 2 puffs into the lungs every 6 (six) hours as needed for wheezing or shortness of breath. 11/24/19  Yes [provider]  atorvastatin (LIPITOR) 20 MG tablet Take 1 tablet (20 mg total) by mouth daily at 6 PM. 09/17/18  Yes Ladell Pier, MD  cyclobenzaprine (FLEXERIL) 10 MG tablet Take 10 mg by mouth 3 (three) times daily as needed for muscle spasms. 02/18/20  Yes [provider]  diclofenac sodium (VOLTAREN) 1 % GEL Apply 2 g topically 4 (four) times daily as needed (knee and back pain).   Yes [provider]  dicyclomine (BENTYL) 10 MG capsule Take 10 mg by mouth 3 (three) times daily as needed. Stomach cramps 02/06/20  Yes [provider]  docusate sodium (COLACE) 100 MG capsule Take 1 capsule (100 mg total) by mouth every 12 (twelve) hours. 10/24/16  Yes Harris, Abigail, PA-C  fluticasone (FLONASE) 50 MCG/ACT nasal spray Place 2 sprays into both nostrils daily. Patient taking differently: Place 2 sprays into both nostrils daily as needed for allergies.  02/04/17  Yes Langeland, Dawn T, MD  furosemide (LASIX) 40 MG tablet Take 1 tablet (40 mg total) by mouth daily. Take extra 40 mg tablet once in the afternoon AS NEEDED for weight gain 3 lbs or more. 09/17/18  Yes Ladell Pier, MD  gabapentin (NEURONTIN) 300 MG capsule Take 600 mg by mouth 3 (three) times daily.   Yes [provider]  LORazepam (ATIVAN) 0.5 MG tablet Take 0.5 mg by mouth every 8 (eight) hours as needed for anxiety or sleep.   Yes [provider]  meclizine (ANTIVERT) 25 MG tablet Take 25 mg by mouth 2 (two) times daily as needed for dizziness.   Yes [provider]  Melatonin 5 MG TABS Take 5 mg by mouth at bedtime.   Yes [provider]  mirtazapine (REMERON) 15 MG tablet Take 15 mg by mouth at bedtime. Total dosage is 60 mg   Yes [provider]  mirtazapine (REMERON) 45 MG tablet Take 45 mg by mouth at bedtime. Total dosage is 60 mg   Yes [provider]  montelukast (SINGULAIR) 10 MG tablet Take 10 mg by mouth every evening. 02/21/20  Yes [provider]  omeprazole (PRILOSEC) 40 MG capsule Take 40 mg by mouth 2 (two) times daily. 02/01/20  Yes [provider]  polyethylene glycol powder (GLYCOLAX/MIRALAX) powder Take 17 g by mouth 2 (two) times daily as needed. Patient taking differently: Take 17 g by mouth 2 (two) times daily as needed for mild constipation.  06/03/18  Yes McClung, Dionne Bucy, PA-C  Riociguat (ADEMPAS) 2 MG TABS Take 2 mg by mouth 3 (three) times daily.    Yes [provider]  spironolactone (ALDACTONE) 25 MG tablet Take 12.5 mg by mouth every other day.   Yes [provider]  traZODone (DESYREL) 150  MG tablet Take 150 mg by mouth at bedtime as needed for sleep. 01/10/20  Yes [provider]  XARELTO 20 MG TABS tablet TAKE 1 TABLET BY MOUTH ONCE DAILY WITH SUPPER Patient taking differently: Take 20 mg by mouth daily with supper.  11/23/19  Yes Mannam, Praveen, MD  methocarbamol (ROBAXIN) 500 MG tablet Take 1 tablet (500 mg total) by mouth 2 (two) times daily as needed. Patient not taking: Reported on 02/25/2020 12/14/19   Aundra Dubin, PA-C  traMADol Veatrice Bourbon) 50 MG tablet Take 1-2 tabs po bid prn pain Patient not taking: Reported on 02/25/2020 10/27/19   Aundra Dubin, PA-C    Scheduled Meds:  acetaminophen  650 mg Oral Once   atorvastatin  20 mg Oral q1800   Chlorhexidine Gluconate Cloth  6 each Topical Daily   docusate sodium  100 mg Oral Q12H   enoxaparin (LOVENOX) injection  40 mg Subcutaneous Q24H   gabapentin  600 mg Oral TID   melatonin  5 mg Oral QHS   mirtazapine  60 mg Oral QHS   montelukast  10 mg Oral QPM   pantoprazole (PROTONIX) IV  40 mg Intravenous Q12H   Riociguat  2 mg Oral TID   Continuous Infusions:  sodium chloride 1,000 mL  (02/25/20 1216)   [START ON 02/26/2020] cefTRIAXone (ROCEPHIN)  IV     PRN Meds:.acetaminophen **OR** acetaminophen, acetaminophen-codeine, albuterol, cyclobenzaprine, diclofenac sodium, dicyclomine, fluticasone, HYDROmorphone (DILAUDID) injection, LORazepam, meclizine, polyethylene glycol, sodium chloride flush, traZODone  Allergies as of 02/24/2020   (No Known Allergies)    Family History  Problem Relation Age of Onset   Lung cancer Maternal Grandmother    Dementia Mother    Dementia Father    Anxiety disorder Sister    Bipolar disorder Sister    Drug abuse Sister    Sexual abuse Maternal Aunt    Anxiety disorder Cousin    Anxiety disorder Sister    Bipolar disorder Sister    Drug abuse Sister    Breast cancer Neg Hx     Social History   Socioeconomic History   Marital status: Married    Spouse name: Not on file   Number of children: Not on file   Years of education: Not on file   Highest education level: Not on file  Occupational History   Not on file  Tobacco Use   Smoking status: Never Smoker   Smokeless tobacco: Never Used  Vaping Use   Vaping Use: Never used  Substance and Sexual Activity   Alcohol use: No   Drug use: No   Sexual activity: Yes    Partners: Male    Birth control/protection: Surgical  Other Topics Concern   Not on file  Social History Narrative   Not on file   Social Determinants of Health   Financial Resource Strain:    Difficulty of Paying Living Expenses:   Food Insecurity:    Worried About Charity fundraiser in the Last Year:    Arboriculturist in the Last Year:   Transportation Needs:    Film/video editor (Medical):    Lack of Transportation (Non-Medical):   Physical Activity:    Days of Exercise per Week:    Minutes of Exercise per Session:   Stress:    Feeling of Stress :   Social Connections:    Frequency of Communication with Friends and Family:    Frequency of Social Gatherings with Friends and Family:  Attends Religious Services:    Active Member of Clubs or Organizations:    Attends Music therapist:    Marital Status:   Intimate Partner Violence:    Fear of Current or Ex-Partner:    Emotionally Abused:    Physically Abused:    Sexually Abused:     Review of Systems: Review of Systems  Constitutional: Positive for fever, malaise/fatigue and weight loss. Negative for chills.  HENT: Negative for hearing loss and tinnitus.   Eyes: Negative for pain and redness.  Respiratory: Negative for cough and shortness of breath.   Cardiovascular: Positive for chest pain. Negative for palpitations.  Gastrointestinal: Positive for abdominal pain, heartburn, melena and nausea. Negative for blood in stool, constipation, diarrhea and vomiting.  Genitourinary: Positive for flank pain. Negative for dysuria.  Musculoskeletal: Positive for back pain (lower back, flanks). Negative for falls.  Skin: Negative for itching and rash.  Neurological: Negative for seizures and loss of consciousness.  Endo/Heme/Allergies: Negative for polydipsia. Bruises/bleeds easily (Xarelto).  Psychiatric/Behavioral: Negative for substance abuse. The patient is not nervous/anxious.      Physical Exam: Vital signs: Vitals:   02/25/20 1430 02/25/20 1458  BP: 119/80 (!) 102/53  Pulse: 80 93  Resp: 20 18  Temp:    SpO2: 99% 99%      Physical Exam Constitutional:      General: She is not in acute distress.    Appearance: She is obese.  HENT:     Head: Normocephalic and atraumatic.     Nose: Nose normal.     Mouth/Throat:     Mouth: Mucous membranes are moist.     Pharynx: Oropharynx is clear.  Eyes:     General: No scleral icterus.    Extraocular Movements: Extraocular movements intact.     Conjunctiva/sclera: Conjunctivae normal.  Cardiovascular:     Rate and Rhythm: Normal rate and regular rhythm.     Pulses: Normal pulses.     Heart sounds: Normal heart sounds.  Pulmonary:     Effort:  Pulmonary effort is normal. No respiratory distress.     Breath sounds: Normal breath sounds.  Abdominal:     General: Bowel sounds are normal. There is no distension.     Palpations: Abdomen is soft. There is no mass.     Tenderness: There is abdominal tenderness (moderate epigastric tenderness, mild diffuse tenderness). There is no guarding or rebound.     Hernia: No hernia is present.  Musculoskeletal:        General: No tenderness.     Cervical back: Normal range of motion and neck supple.     Right lower leg: Edema (1+ pitting) present.     Left lower leg: Edema (1+ pitting) present.  Skin:    General: Skin is warm and dry.  Neurological:     General: No focal deficit present.     Mental Status: She is oriented to person, place, and time. She is lethargic.  Psychiatric:        Mood and Affect: Mood normal.        Behavior: Behavior normal.     GI:  Lab Results: Recent Labs    02/24/20 1826  WBC 8.6  HGB 12.6  HCT 40.5  PLT 279   BMET Recent Labs    02/24/20 1826  NA 141  K 3.7  CL 107  CO2 24  GLUCOSE 100*  BUN 8  CREATININE 1.00  CALCIUM 9.6   LFT Recent Labs  02/24/20 1826  PROT 7.2  ALBUMIN 3.8  AST 17  ALT 10  ALKPHOS 75  BILITOT 0.6   PT/INR No results for input(s): LABPROT, INR in the last 72 hours.   Studies/Results: CT ABDOMEN PELVIS W CONTRAST  Result Date: 02/25/2020 CLINICAL DATA:  59 year old female with history of right upper quadrant abdominal pain and nausea. Diffuse lower abdominal pain. Loss of appetite for 1 week. EXAM: CT ABDOMEN AND PELVIS WITH CONTRAST TECHNIQUE: Multidetector CT imaging of the abdomen and pelvis was performed using the standard protocol following bolus administration of intravenous contrast. CONTRAST:  143mL OMNIPAQUE IOHEXOL 300 MG/ML  SOLN COMPARISON:  CT the abdomen and pelvis 10/24/2016. FINDINGS: Lower chest: Linear scarring in the lower lobes of the lungs bilaterally. Hepatobiliary: No suspicious  cystic or solid hepatic lesions. No intra or extrahepatic biliary ductal dilatation. Status post cholecystectomy. Pancreas: No pancreatic mass. No pancreatic ductal dilatation. No pancreatic or peripancreatic fluid collections or inflammatory changes. Spleen: Unremarkable. Adrenals/Urinary Tract: 2.5 x 1.5 cm right adrenal nodule, indeterminate. Left adrenal gland and right kidney are normal in appearance. Subcentimeter low-attenuation lesion in the interpolar region of the left kidney, too small to characterize, but statistically likely to represent a cyst. No hydroureteronephrosis. Urinary bladder is normal in appearance. Stomach/Bowel: Normal appearance of the stomach. No pathologic dilatation of small bowel or colon. Normal appendix. Vascular/Lymphatic: Aortic atherosclerosis. No lymphadenopathy noted in the abdomen or pelvis. IVC filter in position with tip terminating immediately below the level of the renal veins. No lymphadenopathy noted in the abdomen or pelvis. Reproductive: Status post hysterectomy. Ovaries are not confidently identified may be surgically absent or atrophic. Other: No significant volume of ascites.  No pneumoperitoneum. Musculoskeletal: There are no aggressive appearing lytic or blastic lesions noted in the visualized portions of the skeleton. IMPRESSION: 1. No acute findings are noted in the abdomen or pelvis to account for the patient's symptoms. 2. Status post cholecystectomy. 3. Indeterminate right adrenal nodule measuring 2.5 x 1.5 cm, similar to remote prior study from 10/24/2016, likely to represent a benign lesion such as an adenoma. 4. Aortic atherosclerosis. 5. Additional incidental findings, as above. Electronically Signed   By: Vinnie Langton M.D.   On: 02/25/2020 12:41   DG Chest Portable 1 View  Result Date: 02/25/2020 CLINICAL DATA:  59 year old female with history of cough, shortness of breath and chest pain. EXAM: PORTABLE CHEST 1 VIEW COMPARISON:  Chest x-ray  09/28/2019. FINDINGS: Right subclavian single-lumen porta cath with tip terminating at the superior cavoatrial junction lung volumes are slightly low. Mild elevation of the right hemidiaphragm is unchanged. No acute consolidative airspace disease. No pleural effusions. No evidence of pulmonary edema. No pneumothorax. No definite suspicious appearing pulmonary nodules or masses are noted. Heart size is upper limits of normal. Upper mediastinal contours are within normal limits. Aortic atherosclerosis. Status post median sternotomy. IMPRESSION: 1. No radiographic evidence of acute cardiopulmonary disease. 2. Aortic atherosclerosis. Electronically Signed   By: Vinnie Langton M.D.   On: 02/25/2020 08:32    Impression/ Epigastric abdominal pain and melena in the setting of Xarelto use (last dose 6/16).  Gastritis versus PUD. -Hgb 12.6 on arrival yesterday 6/17, stable compared to baseline  -Normal renal function: BUN 8/CR 1.0 as of 6/17 -History of gastritis and hiatal hernia -FOBT negative -Lipase negative  Fever, possible sepsis.  Suspected UTI.  History of PE, on Xarelto  Morbid obesity (BMI 58.99)  Plan: EGD tomorrow with Dr. Therisa Doyne.  I thoroughly discussed the procedure, nature,  benefits, and risks (to include bleeding, infection, perforation, anesthesia/heart and lung complications) with the patient.  Patient verbalized understanding and gave consent to proceed with EGD with Dr. Therisa Doyne.  Continue to hold Xarelto.  Continue Protonix 40 mg twice daily.  Continue to monitor H&H with transfusion as needed to maintain hemoglobin greater than 7-8.  [facility] will follow.   LOS: 0 days   Salley Slaughter  PA-C 02/25/2020, 3:43 PM  Contact #  [phone]

## 2020-02-25 NOTE — Plan of Care (Signed)
  Problem: Education: Goal: Knowledge of General Education information will improve Description: Including pain rating scale, medication(s)/side effects and non-pharmacologic comfort measures Outcome: Progressing  Aeb pt verbalizes poc  Problem: Clinical Measurements: Goal: Ability to maintain clinical measurements within normal limits will improve Outcome: Progressing  Aeb no new s/s H&H stable Problem: Activity: Goal: Risk for activity intolerance will decrease Outcome: Progressing  Aeb pty up to bathroom independently

## 2020-02-26 ENCOUNTER — Encounter (HOSPITAL_COMMUNITY): Payer: Self-pay | Admitting: Internal Medicine

## 2020-02-26 ENCOUNTER — Inpatient Hospital Stay (HOSPITAL_COMMUNITY): Payer: Medicare HMO | Admitting: Certified Registered Nurse Anesthetist

## 2020-02-26 ENCOUNTER — Encounter (HOSPITAL_COMMUNITY)
Admission: EM | Disposition: A | Discharge status: Home / Self Care | Payer: Self-pay | Source: Home / Self Care | Attending: Internal Medicine

## 2020-02-26 DIAGNOSIS — M069 Rheumatoid arthritis, unspecified: Secondary | ICD-10-CM

## 2020-02-26 DIAGNOSIS — R0789 Other chest pain: Secondary | ICD-10-CM

## 2020-02-26 DIAGNOSIS — I2724 Chronic thromboembolic pulmonary hypertension: Secondary | ICD-10-CM

## 2020-02-26 DIAGNOSIS — I10 Essential (primary) hypertension: Secondary | ICD-10-CM

## 2020-02-26 DIAGNOSIS — G4733 Obstructive sleep apnea (adult) (pediatric): Secondary | ICD-10-CM

## 2020-02-26 DIAGNOSIS — G894 Chronic pain syndrome: Secondary | ICD-10-CM

## 2020-02-26 DIAGNOSIS — N390 Urinary tract infection, site not specified: Secondary | ICD-10-CM

## 2020-02-26 DIAGNOSIS — Z9989 Dependence on other enabling machines and devices: Secondary | ICD-10-CM

## 2020-02-26 HISTORY — PX: BIOPSY: SHX5522

## 2020-02-26 HISTORY — PX: ESOPHAGOGASTRODUODENOSCOPY: SHX5428

## 2020-02-26 LAB — CULTURE, BLOOD (ROUTINE X 2): Special Requests: ADEQUATE

## 2020-02-26 LAB — COMPREHENSIVE METABOLIC PANEL
ALT: 13 U/L (ref 0–44)
AST: 22 U/L (ref 15–41)
Albumin: 3 g/dL — ABNORMAL LOW (ref 3.5–5.0)
Alkaline Phosphatase: 66 U/L (ref 38–126)
Anion gap: 8 (ref 5–15)
BUN: 5 mg/dL — ABNORMAL LOW (ref 6–20)
CO2: 24 mmol/L (ref 22–32)
Calcium: 8.3 mg/dL — ABNORMAL LOW (ref 8.9–10.3)
Chloride: 106 mmol/L (ref 98–111)
Creatinine, Ser: 1.17 mg/dL — ABNORMAL HIGH (ref 0.44–1.00)
GFR calc Af Amer: 60 mL/min — ABNORMAL LOW (ref >60)
GFR calc non Af Amer: 52 mL/min — ABNORMAL LOW (ref >60)
Glucose, Bld: 108 mg/dL — ABNORMAL HIGH (ref 70–99)
Potassium: 3.4 mmol/L — ABNORMAL LOW (ref 3.5–5.1)
Sodium: 138 mmol/L (ref 135–145)
Total Bilirubin: 0.6 mg/dL (ref 0.3–1.2)
Total Protein: 6.2 g/dL — ABNORMAL LOW (ref 6.5–8.1)

## 2020-02-26 LAB — CBC
HCT: 36.6 % (ref 36.0–46.0)
Hemoglobin: 11.2 g/dL — ABNORMAL LOW (ref 12.0–15.0)
MCH: 21.1 pg — ABNORMAL LOW (ref 26.0–34.0)
MCHC: 30.6 g/dL (ref 30.0–36.0)
MCV: 69.1 fL — ABNORMAL LOW (ref 80.0–100.0)
Platelets: 228 10*3/uL (ref 150–400)
RBC: 5.3 MIL/uL — ABNORMAL HIGH (ref 3.87–5.11)
RDW: 17.6 % — ABNORMAL HIGH (ref 11.5–15.5)
WBC: 8.6 10*3/uL (ref 4.0–10.5)
nRBC: 0 % (ref 0.0–0.2)

## 2020-02-26 SURGERY — EGD (ESOPHAGOGASTRODUODENOSCOPY)
Anesthesia: Monitor Anesthesia Care

## 2020-02-26 MED ORDER — PANTOPRAZOLE SODIUM 40 MG IV SOLR
40.0000 mg | INTRAVENOUS | Status: DC
  Administered 2020-02-26 – 2020-02-27 (×2): 40 mg via INTRAVENOUS
  Filled 2020-02-26 (×2): qty 40

## 2020-02-26 MED ORDER — SODIUM CHLORIDE 0.9 % IV BOLUS
500.0000 mL | Freq: Once | INTRAVENOUS | Status: AC
  Administered 2020-02-26: 500 mL via INTRAVENOUS

## 2020-02-26 MED ORDER — MIDAZOLAM HCL 2 MG/2ML IJ SOLN
INTRAMUSCULAR | Status: AC
  Filled 2020-02-26: qty 2

## 2020-02-26 MED ORDER — BUTAMBEN-TETRACAINE-BENZOCAINE 2-2-14 % EX AERO
INHALATION_SPRAY | CUTANEOUS | Status: DC | PRN
  Administered 2020-02-26: 2 via TOPICAL

## 2020-02-26 MED ORDER — ONDANSETRON HCL 4 MG/2ML IJ SOLN
4.0000 mg | Freq: Four times a day (QID) | INTRAMUSCULAR | Status: DC | PRN
  Administered 2020-02-26 – 2020-02-28 (×5): 4 mg via INTRAVENOUS
  Filled 2020-02-26 (×6): qty 2

## 2020-02-26 MED ORDER — RIVAROXABAN 20 MG PO TABS
20.0000 mg | ORAL_TABLET | Freq: Every day | ORAL | Status: DC
  Administered 2020-02-26 – 2020-02-27 (×2): 20 mg via ORAL
  Filled 2020-02-26 (×2): qty 1

## 2020-02-26 MED ORDER — PROPOFOL 500 MG/50ML IV EMUL
INTRAVENOUS | Status: DC | PRN
  Administered 2020-02-26: 125 ug/kg/min via INTRAVENOUS

## 2020-02-26 MED ORDER — FENTANYL CITRATE (PF) 100 MCG/2ML IJ SOLN
INTRAMUSCULAR | Status: AC
  Filled 2020-02-26: qty 2

## 2020-02-26 MED ORDER — MIDAZOLAM HCL 5 MG/5ML IJ SOLN
INTRAMUSCULAR | Status: DC | PRN
Start: 2020-02-26 — End: 2020-02-26
  Administered 2020-02-26: 1 mg via INTRAVENOUS

## 2020-02-26 MED ORDER — FENTANYL CITRATE (PF) 100 MCG/2ML IJ SOLN
INTRAMUSCULAR | Status: DC | PRN
  Administered 2020-02-26: 25 ug via INTRAVENOUS

## 2020-02-26 NOTE — Anesthesia Procedure Notes (Signed)
Procedure Name: [place] Performed by: Milford Cage, CRNA Pre-anesthesia Checklist: Patient identified, Emergency Drugs available, Suction available, Patient being monitored and Timeout performed Oxygen Delivery Method: Nasal cannula

## 2020-02-26 NOTE — Brief Op Note (Signed)
02/24/2020 - 02/26/2020  8:25 AM  PATIENT:  [name]  59 y.o. female  PRE-OPERATIVE DIAGNOSIS:  melena, epigastric abdominal pain  POST-OPERATIVE DIAGNOSIS:  mild gastritis, biopsy antrum for h. pylori  PROCEDURE:  Procedure(s): ESOPHAGOGASTRODUODENOSCOPY (EGD) (N/A)  SURGEON:  Surgeon(s) and Role:    Ronnette Juniper, MD - Primary  PHYSICIAN ASSISTANT:   ASSISTANTS: Clyde Lundborg RN, Lazaro Arms, Tech  ANESTHESIA:   MAC  EBL:  None  BLOOD ADMINISTERED:none  DRAINS: none   LOCAL MEDICATIONS USED:  NONE  SPECIMEN:  Biopsy / Limited Resection  DISPOSITION OF SPECIMEN:  PATHOLOGY  COUNTS:  YES  TOURNIQUET:  * No tourniquets in log *  DICTATION: .Dragon Dictation  PLAN OF CARE: Admit to inpatient   PATIENT DISPOSITION:  PACU - hemodynamically stable.   Delay start of Pharmacological VTE agent (>24hrs) due to surgical blood loss or risk of bleeding: no

## 2020-02-26 NOTE — Progress Notes (Addendum)
PROGRESS NOTE  [name] [name] JHE:[id number] DOB: 07-16-1961 DOA: 02/24/2020 PCP: Ruthine Dose, MD   LOS: 1 day   Brief narrative: As per HPI,  [name] [name] is a 59 y.o. 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  Patient had similar chest pain occasionally since 2017 as has been following with cardiologist who considered the chest pain is related to her severe pulmonary HTN. In the ED,CTabd showed no acute findings. She spiked fever in the ED, urine showed UTI.  WBC 8.6 hemoglobin 12.6, BUN 8, creatinine 1.0.  Patient was then admitted to the hospital for GI bleed UTI  Assessment/Plan:  Principal Problem:   GI bleed Active Problems:   Chronic pain syndrome   OSA on CPAP   CTEPH (chronic thromboembolic pulmonary hypertension) (HCC)   Essential hypertension   Atypical chest pain   Morbid obesity (HCC)   Rheumatoid arthritis involving multiple sites ([place])  GI bleed with melena. Continue PPI.  Patient has history of EGD colonoscopy and capsule endoscopy in 11/19 and was noted to have gastric ulcer.  Patient underwent endoscopic evaluation today with unremarkable findings except for mild gastritis and biopsy from the antrum was taken for H. pylori..  Patient follows up with GI as outpatient.  Has been advanced on regular diet.  Okay to start Xarelto as per GI.  Patient did have a stable hemoglobin.  Fever tachycardia borderline hypotension.  Likely secondary to UTI.  On Rocephin.  We will continue with it.   Blood cultures showing gram-positive rod bacteremia.  Urine culture shows more than 100,000 colonies of gram-negative rods.  We will continue to  follow.  Gram-positive rod bacteremia likely bacillus species.  We will continue to monitor closely.  T-max of 101.9 F.  Mild hypokalemia.  Potassium of 3.4.  Will replenish.  Check levels in a.m.  Acute on chronic chest pain, atypical chest pain. No chest pain at the time of my evaluation.  History of similar problem before.  Seen by cardiology in the past.  Patient does have chronic pulmonary hypertension. She was cathed in 2017 at [facility] showed nonobstructive CAD, and she was diagnosed with PE status post embolectomy at the same time which was treated with systemic anticoagulation as well as IVC filter at the same time in 2017.  Resume Xarelto today.  History of PE status post embolectomy and IVC filter in 2017 -On prophylaxis dosage of Lovenox, hold for now.  Patient was on lifelong anticoagulation.  Xarelto tonight.  History of diastolic CHF, chronic -Continue Lipitor,  DVT prophylaxis: Xarelto will be resumed  Code Status: Full code  Family Communication: Spoke with the patient's husband at bedside.  Status is: Inpatient  Remains inpatient appropriate because:Inpatient level of care appropriate due to severity of illness and GI work-up including endoscopy, monitoring for gram-positive bacteremia, follow cultures, patient status post endoscopy   Dispo: The patient is from: Home              Anticipated d/c is to: Home              Anticipated d/c date is: 1 to 2 days, likely tomorrow  Patient currently is not medically stable to d/c.  Consultants:  GI  Procedures:  EGD with biopsy on 02/26/2020.  Antibiotics:  . Rocephin 6/18>  Subjective: Today, patient was seen and examined at bedside.  Seen after endoscopy.  Patient feels fatigued and weak, nauseated and does not feel well.  Denies any dysuria urgency.  Complaints of mild abdominal discomfort.  No vomiting.  Objective: Vitals:   02/26/20 0622 02/26/20 0724  BP: 96/61   Pulse: 92   Resp: 20    Temp: 98.8 F (37.1 C)   SpO2: 98% 94%    Intake/Output Summary (Last 24 hours) at 02/26/2020 0732 Last data filed at 02/26/2020 0522 Gross per 24 hour  Intake 1771.32 ml  Output --  Net 1771.32 ml   Filed Weights   02/24/20 1824 02/26/20 0724  Weight: (!) 151 kg (!) 151 kg   Body mass index is 58.99 kg/m.   Physical Exam: GENERAL: Patient is alert awake and oriented. Not in obvious distress.  Morbidly obese, on nasal cannula. HENT: No scleral pallor or icterus. Pupils equally reactive to light. Oral mucosa is moist NECK: is supple, no gross swelling noted. CHEST: Clear to auscultation. No crackles or wheezes.  Diminished breath sounds bilaterally.  Midsternal scar. CVS: S1 and S2 heard, no murmur. Regular rate and rhythm.  ABDOMEN: Soft, non specific tenderness over the epigastric region,, bowel sounds are present. EXTREMITIES: No edema. CNS: Cranial nerves are intact. No focal motor deficits. SKIN: warm and dry without rashes.  Data Review: I have personally reviewed the following laboratory data and studies,  CBC: Recent Labs  Lab 02/24/20 1826 02/25/20 1803 02/26/20 0326  WBC 8.6  --  8.6  HGB 12.6 11.8* 11.2*  HCT 40.5 39.0 36.6  MCV 68.9*  --  69.1*  PLT 279  --  623   Basic Metabolic Panel: Recent Labs  Lab 02/24/20 1826 02/26/20 0326  NA 141 138  K 3.7 3.4*  CL 107 106  CO2 24 24  GLUCOSE 100* 108*  BUN 8 5*  CREATININE 1.00 1.17*  CALCIUM 9.6 8.3*   Liver Function Tests: Recent Labs  Lab 02/24/20 1826 02/26/20 0326  AST 17 22  ALT 10 13  ALKPHOS 75 66  BILITOT 0.6 0.6  PROT 7.2 6.2*  ALBUMIN 3.8 3.0*   Recent Labs  Lab 02/25/20 0830  LIPASE 23   No results for input(s): AMMONIA in the last 168 hours. Cardiac Enzymes: No results for input(s): CKTOTAL, CKMB, CKMBINDEX, TROPONINI in the last 168 hours. BNP (last 3 results) Recent Labs    06/03/19 1425 09/18/19 1211  BNP 16.5 25.7    ProBNP (last 3 results) No results for  input(s): PROBNP in the last 8760 hours.  CBG: No results for input(s): GLUCAP in the last 168 hours. Recent Results (from the past 240 hour(s))  SARS Coronavirus 2 by RT PCR (hospital order, performed in [facility] hospital lab) Nasopharyngeal Nasopharyngeal Swab     Status: None   Collection Time: 02/25/20 10:37 AM   Specimen: Nasopharyngeal Swab  Result Value Ref Range Status   SARS Coronavirus 2 NEGATIVE NEGATIVE Final    Comment: (NOTE) SARS-CoV-2 target nucleic acids are NOT DETECTED.  The SARS-CoV-2 RNA is generally detectable in upper and lower respiratory specimens during the acute phase of infection. The lowest concentration of SARS-CoV-2 viral copies this assay can detect is 250 copies / mL. A negative result does not preclude SARS-CoV-2 infection and should not be used as  the sole basis for treatment or other patient management decisions.  A negative result may occur with improper specimen collection / handling, submission of specimen other than nasopharyngeal swab, presence of viral mutation(s) within the areas targeted by this assay, and inadequate number of viral copies (<250 copies / mL). A negative result must be combined with clinical observations, patient history, and epidemiological information.  Fact Sheet for Patients:   StrictlyIdeas.no  Fact Sheet for Healthcare Providers: BankingDealers.co.za  This test is not yet approved or  cleared by the [place] FDA and has been authorized for detection and/or diagnosis of SARS-CoV-2 by FDA under an Emergency Use Authorization (EUA).  This EUA will remain in effect (meaning this test can be used) for the duration of the COVID-19 declaration under Section 564(b)(1) of the Act, 21 U.S.C. section 360bbb-3(b)(1), unless the authorization is terminated or revoked sooner.  Performed at [facility], [place] [address], [place], [place] [address]      Studies: CT  ABDOMEN PELVIS W CONTRAST  Result Date: 02/25/2020 CLINICAL DATA:  59 year old female with history of right upper quadrant abdominal pain and nausea. Diffuse lower abdominal pain. Loss of appetite for 1 week. EXAM: CT ABDOMEN AND PELVIS WITH CONTRAST TECHNIQUE: Multidetector CT imaging of the abdomen and pelvis was performed using the standard protocol following bolus administration of intravenous contrast. CONTRAST:  160mL OMNIPAQUE IOHEXOL 300 MG/ML  SOLN COMPARISON:  CT the abdomen and pelvis 10/24/2016. FINDINGS: Lower chest: Linear scarring in the lower lobes of the lungs bilaterally. Hepatobiliary: No suspicious cystic or solid hepatic lesions. No intra or extrahepatic biliary ductal dilatation. Status post cholecystectomy. Pancreas: No pancreatic mass. No pancreatic ductal dilatation. No pancreatic or peripancreatic fluid collections or inflammatory changes. Spleen: Unremarkable. Adrenals/Urinary Tract: 2.5 x 1.5 cm right adrenal nodule, indeterminate. Left adrenal gland and right kidney are normal in appearance. Subcentimeter low-attenuation lesion in the interpolar region of the left kidney, too small to characterize, but statistically likely to represent a cyst. No hydroureteronephrosis. Urinary bladder is normal in appearance. Stomach/Bowel: Normal appearance of the stomach. No pathologic dilatation of small bowel or colon. Normal appendix. Vascular/Lymphatic: Aortic atherosclerosis. No lymphadenopathy noted in the abdomen or pelvis. IVC filter in position with tip terminating immediately below the level of the renal veins. No lymphadenopathy noted in the abdomen or pelvis. Reproductive: Status post hysterectomy. Ovaries are not confidently identified may be surgically absent or atrophic. Other: No significant volume of ascites.  No pneumoperitoneum. Musculoskeletal: There are no aggressive appearing lytic or blastic lesions noted in the visualized portions of the skeleton. IMPRESSION: 1. No acute  findings are noted in the abdomen or pelvis to account for the patient's symptoms. 2. Status post cholecystectomy. 3. Indeterminate right adrenal nodule measuring 2.5 x 1.5 cm, similar to remote prior study from 10/24/2016, likely to represent a benign lesion such as an adenoma. 4. Aortic atherosclerosis. 5. Additional incidental findings, as above. Electronically Signed   By: Vinnie Langton M.D.   On: 02/25/2020 12:41   DG Chest Portable 1 View  Result Date: 02/25/2020 CLINICAL DATA:  59 year old female with history of cough, shortness of breath and chest pain. EXAM: PORTABLE CHEST 1 VIEW COMPARISON:  Chest x-ray 09/28/2019. FINDINGS: Right subclavian single-lumen porta cath with tip terminating at the superior cavoatrial junction lung volumes are slightly low. Mild elevation of the right hemidiaphragm is unchanged. No acute consolidative airspace disease. No pleural effusions. No evidence of pulmonary edema. No pneumothorax. No definite suspicious appearing pulmonary nodules or  masses are noted. Heart size is upper limits of normal. Upper mediastinal contours are within normal limits. Aortic atherosclerosis. Status post median sternotomy. IMPRESSION: 1. No radiographic evidence of acute cardiopulmonary disease. 2. Aortic atherosclerosis. Electronically Signed   By: Vinnie Langton M.D.   On: 02/25/2020 08:32      Flora Lipps, MD  [facility] 02/26/2020

## 2020-02-26 NOTE — Interval H&P Note (Signed)
History and Physical Interval Note: 58/female with black tarry stools, epigastric pain, was on xarelto, last dose on 02/23/20,for an EGD.  02/26/2020 7:30 AM  [name] [name]  has presented today for EGD, with the diagnosis of melena, epigastric abdominal pain.  The various methods of treatment have been discussed with the patient and family. After consideration of risks, benefits and other options for treatment, the patient has consented to  Procedure(s): ESOPHAGOGASTRODUODENOSCOPY (EGD) (N/A) as a surgical intervention.  The patient's history has been reviewed, patient examined, no change in status, stable for surgery.  I have reviewed the patient's chart and labs.  Questions were answered to the patient's satisfaction.     Ronnette Juniper

## 2020-02-26 NOTE — Progress Notes (Signed)
   02/26/20 0419  Assess: MEWS Score  Temp (!) 100.4 F (38 C)  BP 106/62  Pulse Rate (!) 113  Resp 19  SpO2 92 %  O2 Device Nasal Cannula  O2 Flow Rate (L/min) 1.5 L/min  Assess: MEWS Score  MEWS Temp 0  MEWS Systolic 0  MEWS Pulse 2  MEWS RR 0  MEWS LOC 0  MEWS Score 2  MEWS Score Color Yellow  Notify: Charge Nurse/RN  Name of Charge Nurse/RN Notified Celso  Date Charge Nurse/RN Notified 02/26/20  Time Charge Nurse/RN Notified 0400  Notify: Provider  Provider Name/Title Blount,Xenia  Date Provider Notified 02/26/20  Time Provider Notified 0410  Notification Type Page  Notification Reason Other (Comment)  Response See new orders  Date of Provider Response 02/26/20

## 2020-02-26 NOTE — Progress Notes (Signed)
Red MEWS protocol in progress. MD gave order for a bolus of normal saline

## 2020-02-26 NOTE — Transfer of Care (Signed)
Immediate Anesthesia Transfer of Care Note  Patient: [name] [name] [name]  Procedure(s) Performed: ESOPHAGOGASTRODUODENOSCOPY (EGD) (N/A )  Patient Location: Endoscopy Unit  Anesthesia Type:MAC  Level of Consciousness: awake  Airway & Oxygen Therapy: Patient Spontanous Breathing and Patient connected to nasal cannula oxygen  Post-op Assessment: Report given to RN and Post -op Vital signs reviewed and stable  Post vital signs: Reviewed and stable  Last Vitals:  Vitals Value Taken Time  BP 126/52 02/26/20 0829  Temp    Pulse 85 02/26/20 0829  Resp 10 02/26/20 0829  SpO2 98 % 02/26/20 0829  Vitals shown include unvalidated device data.  Last Pain:  Vitals:   02/26/20 0522  TempSrc: Oral  PainSc:       Patients Stated Pain Goal: 5 (91/63/84 6659)  Complications: No complications documented.

## 2020-02-26 NOTE — Anesthesia Preprocedure Evaluation (Addendum)
Anesthesia Evaluation  Patient identified by MRN, date of birth, ID band Patient awake    Reviewed: Allergy & Precautions, NPO status , Patient's Chart, lab work & pertinent test results  History of Anesthesia Complications (+) DIFFICULT AIRWAY and history of anesthetic complications (First intubating attempt by CTA fellow with video laryngoscope D blade which shows very poor view with excessive soft tissues with poor anatomic landmark. Unable to identify epiglottis, patient was mask by two person with oral airway then re-attempted witU)  Airway Mallampati: II  TM Distance: >3 FB Neck ROM: Full    Dental  (+) Dental Advisory Given   Pulmonary sleep apnea and Continuous Positive Airway Pressure Ventilation , COPD,  COPD inhaler, PE 02/25/2020 SARS coronavirus NEG   breath sounds clear to auscultation       Cardiovascular hypertension, +CHF (h/o R heart failure, no longer on nitric)   Rhythm:Regular Rate:Normal  '20 ECHO: EF 60-65%, valves OK, mildly decreased RV function   Neuro/Psych Anxiety Depression    GI/Hepatic Neg liver ROS, GERD  Medicated,  Endo/Other  Morbid obesity  Renal/GU negative Renal ROS     Musculoskeletal  (+) Arthritis ,   Abdominal (+) + obese,   Peds  Hematology xarelto   Anesthesia Other Findings   Reproductive/Obstetrics                            Anesthesia Physical Anesthesia Plan  ASA: III  Anesthesia Plan: MAC   Post-op Pain Management:    Induction:   PONV Risk Score and Plan: 2 and Treatment may vary due to age or medical condition  Airway Management Planned: Natural Airway and Nasal Cannula  Additional Equipment:   Intra-op Plan:   Post-operative Plan:   Informed Consent: I have reviewed the patients History and Physical, chart, labs and discussed the procedure including the risks, benefits and alternatives for the proposed anesthesia with the  patient or authorized representative who has indicated his/her understanding and acceptance.     Dental advisory given  Plan Discussed with: CRNA and Surgeon  Anesthesia Plan Comments:        Anesthesia Quick Evaluation

## 2020-02-26 NOTE — Progress Notes (Signed)
Pt with red mews assessed for change in status,no change from baseline noted.MEWS documentation as charted above.Bolus administered as ordered. Pt currently resting in bed with no complaints voiced. Monitoring to continue

## 2020-02-26 NOTE — Anesthesia Postprocedure Evaluation (Signed)
Anesthesia Post Note  Patient: [name] [name] [facility]  Procedure(s) Performed: ESOPHAGOGASTRODUODENOSCOPY (EGD) (N/A ) BIOPSY     Patient location during evaluation: Endoscopy Anesthesia Type: MAC Level of consciousness: awake and alert, oriented and patient cooperative Pain management: pain level controlled Vital Signs Assessment: post-procedure vital signs reviewed and stable Respiratory status: spontaneous breathing, nonlabored ventilation, respiratory function stable and patient connected to nasal cannula oxygen Cardiovascular status: blood pressure returned to baseline and stable Postop Assessment: no apparent nausea or vomiting and adequate PO intake Anesthetic complications: no   No complications documented.  Last Vitals:  Vitals:   02/26/20 0841 02/26/20 0848  BP:  (!) 133/48  Pulse: 83 80  Resp: (!) 21 14  Temp:    SpO2: 96% 98%    Last Pain:  Vitals:   02/26/20 0828  TempSrc: Oral  PainSc: 7                  Kimarie Coor,E. Knute Mazzuca

## 2020-02-26 NOTE — Op Note (Signed)
[facility] Patient Name: [name] [name] Procedure Date : 02/26/2020 MRN: [id number] Attending MD: Ronnette Juniper , MD Date of Birth: 10-28-1960 CSN: [id number] Age: 59 Admit Type: Inpatient Procedure:                Upper GI endoscopy Indications:              Epigastric abdominal pain, black tarry stools Providers:                Ronnette Juniper, MD, Carlyn Reichert, RN, Clyde Lundborg,                            RN, Lazaro Arms, Technician Referring MD:             [facility] Medicines:                Monitored Anesthesia Care Complications:            No immediate complications. Estimated blood loss:                            None. Estimated Blood Loss:     Estimated blood loss: none. Procedure:                Pre-Anesthesia Assessment:                           - Prior to the procedure, a History and Physical                            was performed, and patient medications and                            allergies were reviewed. The patient's tolerance of                            previous anesthesia was also reviewed. The risks                            and benefits of the procedure and the sedation                            options and risks were discussed with the patient.                            All questions were answered, and informed consent                            was obtained. Prior Anticoagulants: The patient has                            taken Xarelto (rivaroxaban), last dose was 2 days                            prior to procedure. ASA Grade Assessment: III - A  patient with severe systemic disease. After                            reviewing the risks and benefits, the patient was                            deemed in satisfactory condition to undergo the                            procedure.                           After obtaining informed consent, the endoscope was                            passed under direct vision.  Throughout the                            procedure, the patient's blood pressure, pulse, and                            oxygen saturations were monitored continuously. The                            GIF-H190 ([id number]) Olympus gastroscope was                            introduced through the mouth, and advanced to the                            second part of duodenum. The upper GI endoscopy was                            accomplished without difficulty. The patient                            tolerated the procedure well. Scope In: Scope Out: Findings:      The examined esophagus was normal.      The Z-line was regular and was found 40 cm from the incisors.      Localized mildly erythematous mucosa without bleeding was found in the       gastric antrum. Biopsies were taken with a cold forceps for Helicobacter       pylori testing.      The cardia and gastric fundus were normal on retroflexion.      The examined duodenum was normal. Impression:               - Normal esophagus.                           - Z-line regular, 40 cm from the incisors.                           - Erythematous mucosa in the antrum. Biopsied.                           -  Normal examined duodenum. Moderate Sedation:      Patient did not receive moderate sedation for this procedure, but       instead received monitored anesthesia care. Recommendation:           - Resume regular diet.                           - Resume Xarelto (rivaroxaban) at prior dose today.                           - Await pathology results. Procedure Code(s):        --- Professional ---                           [phone], Esophagogastroduodenoscopy, flexible,                            transoral; with biopsy, single or multiple Diagnosis Code(s):        --- Professional ---                           K31.89, Other diseases of stomach and duodenum                           R10.13, Epigastric pain CPT copyright 2019 American Medical Association.  All rights reserved. The codes documented in this report are preliminary and upon coder review may  be revised to meet current compliance requirements. Ronnette Juniper, MD 02/26/2020 8:25:19 AM This report has been signed electronically. Number of Addenda: 0

## 2020-02-26 NOTE — Progress Notes (Signed)
Pt has red MEWS. Heart rate sustaining around 118. Temp 101.9. MD to be notified

## 2020-02-26 NOTE — Progress Notes (Signed)
PHARMACY - PHYSICIAN COMMUNICATION CRITICAL VALUE ALERT - BLOOD CULTURE IDENTIFICATION (BCID)  [name] [name] is an 59 y.o. female who presented to [facility] on 02/24/2020 with a chief complaint of epigastric pain and melena  Assessment:  2/4 bottles positive for GPRs  Name of physician (or Provider) Contacted: Dr. Louanne Belton  Current antibiotics: ceftriaxone   Changes to prescribed antibiotics recommended:  Continue current antibiotics and follow for speciation   No results found for this or any previous visit.  Marliss Czar [facility] 02/26/2020  7:59 AM

## 2020-02-26 NOTE — Progress Notes (Signed)
   02/26/20 0419  Assess: MEWS Score  Temp (!) 100.4 F (38 C)  BP 106/62  Pulse Rate (!) 113  Resp 19  SpO2 92 %  O2 Device Nasal Cannula  O2 Flow Rate (L/min) 1.5 L/min  Assess: MEWS Score  MEWS Temp 0  MEWS Systolic 0  MEWS Pulse 2  MEWS RR 0  MEWS LOC 0  MEWS Score 2  MEWS Score Color Yellow  Assess: if the MEWS score is Yellow or Red  Were vital signs taken at a resting state? Yes  Focused Assessment Documented focused assessment  Early Detection of Sepsis Score *See Row Information* High  MEWS guidelines implemented *See Row Information* Yes  Take Vital Signs  Increase Vital Sign Frequency  Red: Q 1hr X 4 then Q 4hr X 4, if remains red, continue Q 4hrs  Escalate  MEWS: Escalate Red: discuss with charge nurse/RN and provider, consider discussing with RRT  Notify: Charge Nurse/RN  Name of Charge Nurse/RN Notified Celso  Date Charge Nurse/RN Notified 02/26/20  Time Charge Nurse/RN Notified 0400  Notify: Provider  Provider Name/Title Blount,Xenia  Date Provider Notified 02/26/20  Time Provider Notified 0410  Notification Type Page  Notification Reason Other (Comment)  Response See new orders  Date of Provider Response 02/26/20  Document  Progress note created (see row info) Yes

## 2020-02-27 LAB — BASIC METABOLIC PANEL
Anion gap: 6 (ref 5–15)
BUN: <5 mg/dL — ABNORMAL LOW (ref 6–20)
CO2: 24 mmol/L (ref 22–32)
Calcium: 8.1 mg/dL — ABNORMAL LOW (ref 8.9–10.3)
Chloride: 107 mmol/L (ref 98–111)
Creatinine, Ser: 1.02 mg/dL — ABNORMAL HIGH (ref 0.44–1.00)
GFR calc Af Amer: >60 mL/min (ref >60)
GFR calc non Af Amer: >60 mL/min (ref >60)
Glucose, Bld: 118 mg/dL — ABNORMAL HIGH (ref 70–99)
Potassium: 3.7 mmol/L (ref 3.5–5.1)
Sodium: 137 mmol/L (ref 135–145)

## 2020-02-27 LAB — URINE CULTURE: Culture: >=100000 — AB

## 2020-02-27 LAB — CBC
HCT: 35.6 % — ABNORMAL LOW (ref 36.0–46.0)
Hemoglobin: 10.9 g/dL — ABNORMAL LOW (ref 12.0–15.0)
MCH: 21.2 pg — ABNORMAL LOW (ref 26.0–34.0)
MCHC: 30.6 g/dL (ref 30.0–36.0)
MCV: 69.3 fL — ABNORMAL LOW (ref 80.0–100.0)
Platelets: 204 10*3/uL (ref 150–400)
RBC: 5.14 MIL/uL — ABNORMAL HIGH (ref 3.87–5.11)
RDW: 18.1 % — ABNORMAL HIGH (ref 11.5–15.5)
WBC: 6.2 10*3/uL (ref 4.0–10.5)
nRBC: 0 % (ref 0.0–0.2)

## 2020-02-27 LAB — MAGNESIUM: Magnesium: 1.6 mg/dL — ABNORMAL LOW (ref 1.7–2.4)

## 2020-02-27 MED ORDER — POTASSIUM CHLORIDE CRYS ER 20 MEQ PO TBCR
40.0000 meq | EXTENDED_RELEASE_TABLET | Freq: Every day | ORAL | Status: DC
  Administered 2020-02-27 – 2020-02-28 (×2): 40 meq via ORAL
  Filled 2020-02-27: qty 2
  Filled 2020-02-27: qty 4

## 2020-02-27 MED ORDER — SODIUM CHLORIDE 0.9 % IV SOLN
2.0000 g | INTRAVENOUS | Status: DC
  Administered 2020-02-27 – 2020-02-28 (×2): 2 g via INTRAVENOUS
  Filled 2020-02-27: qty 20
  Filled 2020-02-27: qty 2

## 2020-02-27 MED ORDER — FUROSEMIDE 10 MG/ML IJ SOLN
40.0000 mg | Freq: Every day | INTRAMUSCULAR | Status: DC
  Administered 2020-02-27 – 2020-02-28 (×2): 40 mg via INTRAVENOUS
  Filled 2020-02-27 (×2): qty 4

## 2020-02-27 MED ORDER — MAGNESIUM SULFATE 4 GM/100ML IV SOLN
4.0000 g | Freq: Once | INTRAVENOUS | Status: AC
  Administered 2020-02-27: 4 g via INTRAVENOUS
  Filled 2020-02-27: qty 100

## 2020-02-27 NOTE — Progress Notes (Signed)
   02/27/20 0643  Assess: if the MEWS score is Yellow or Red  Were vital signs taken at a resting state? Yes  Focused Assessment Documented focused assessment  Early Detection of Sepsis Score *See Row Information* Low  MEWS guidelines implemented *See Row Information* Yes  Treat  MEWS Interventions Administered prn meds/treatments  Take Vital Signs  Increase Vital Sign Frequency  Yellow: Q 2hr X 2 then Q 4hr X 2, if remains yellow, continue Q 4hrs  Escalate  MEWS: Escalate Yellow: discuss with charge nurse/RN and consider discussing with provider and RRT  Notify: Charge Nurse/RN  Name of Charge Nurse/RN Notified Carla  Date Charge Nurse/RN Notified 02/27/20  Time Charge Nurse/RN Notified [phone]  Notify: Provider  Provider Name/Title D.Croxley  Date Provider Notified 02/27/20  Time Provider Notified [phone]  Notification Type Page  Notification Reason Other (Comment) (per protocol)  Response No new orders  Document  Patient Outcome Other (Comment) (pt condition stable. not unusual for pt to have these readin)  Progress note created (see row info) Yes  Pt cleared from yellow mews at 0230hrs this morning but then turned yellow again at 0600hrs as shown in vital signs column. Prn tylenol administered for fever. Also prn zofran and dilaudid administered . MD text paged

## 2020-02-27 NOTE — Progress Notes (Addendum)
PROGRESS NOTE  [name]:[id number] DOB: 01/13/1961 DOA: 02/24/2020 PCP: Ruthine Dose, MD   LOS: 2 days   Brief narrative: As per HPI,  [name] [name] is a 59 y.o. 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  Patient had similar chest pain occasionally since 2017 as has been following with cardiologist who considered the chest pain is related to her severe pulmonary HTN. In the ED,CTabdomen showed no acute findings. She spiked fever in the ED, urine showed UTI.  WBC 8.6 hemoglobin 12.6, BUN 8, creatinine 1.0.  Patient was then admitted to the hospital for GI bleed UTI  Assessment/Plan:  Principal Problem:   GI bleed Active Problems:   Chronic pain syndrome   OSA on CPAP   CTEPH (chronic thromboembolic pulmonary hypertension) (HCC)   Essential hypertension   Atypical chest pain   Morbid obesity (HCC)   Rheumatoid arthritis involving multiple sites ([place])  GI bleed with melena. Continue PPI.  Patient has history of EGD colonoscopy and capsule endoscopy on 11/19 and was noted to have gastric ulcer.  Patient underwent endoscopic evaluation today with unremarkable findings except for mild gastritis and biopsy from the antrum was taken for H. pylori..  Patient follows up with GI as outpatient.  Has been advanced on regular diet.  Okay to start Xarelto as per GI.  Patient with stable hemoglobin.  Fever, tachycardia, borderline hypotension.  Likely secondary to E. coli UTI.  On Rocephin Urine culture shows E. coli UTI.  Gram-positive rod bacteremia likely bacillus species.  We will continue to monitor closely.  T-max of 101.2 F.  Need to monitor fever curve.  Mild  hypokalemia.  Potassium of 3.7.  Will replenish.  Check levels in a.m.  Acute on chronic chest pain, atypical chest pain. No chest pain at the time of my evaluation.  History of similar problem before.  Seen by cardiology in the past.  Patient does have chronic pulmonary hypertension. She was cathed in 2017 at [facility] showed nonobstructive CAD, and she was diagnosed with PE status post embolectomy at the same time which was treated with systemic anticoagulation as well as IVC filter at the same time in 2017.  Has been resumed on Xarelto.  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  Has been resumed on Xarelto.  Hemoglobin of 10.9 today.   History of diastolic CHF, chronic -Continue Lipitor, now requiring supplemental oxygen.  Patient used to be on oxygen in the past.  Has been seen by Dr. Marland Kitchen in the past.  Will give 1 dose of IV Lasix today.  Discontinue IV fluids.  Try to wean oxygen as able.  Mild hypomagnesemia.  Will replace with magnesium sulfate.  Check levels in a.m.  Add p.o. potassium chloride due to will be initiation of Lasix.    DVT prophylaxis: rivaroxaban (XARELTO) tablet 20 mg   Code Status: Full code  Family Communication: None today.  Spoke with the patient's husband at bedside yesterday.  Status is: Inpatient  Remains inpatient appropriate because:Inpatient level of care appropriate due to severity of illness, gram-positive bacteremia, gram-negative UTI, new oxygen requirement, febrile illness  Dispo: The patient is from: Home  Anticipated d/c is to: Home              Anticipated d/c date is: 1 day, likely tomorrow if continues to feel better, monitor fever curve, wean oxygen as able.              Patient currently is not medically stable to d/c.  Consultants:   GI  Procedures:  EGD with biopsy on 02/26/2020.  Antibiotics:  . Rocephin 6/18>  Subjective: Today, patient was seen and examined in  bedside.  Patient still feels very fatigued weak, nauseated.  Fever of 101.2 F this morning.  Nursing staff reported that that she required a supplemental oxygen.  Objective: Vitals:   02/27/20 0559 02/27/20 0800  BP: 126/71 114/64  Pulse: (!) 109   Resp: 18 18  Temp: (!) 101.2 F (38.4 C) 99 F (37.2 C)  SpO2: 96% 95%   No intake or output data in the 24 hours ending 02/27/20 1115 Filed Weights   02/24/20 1824 02/26/20 0724  Weight: (!) 151 kg (!) 151 kg   Body mass index is 58.99 kg/m.   Physical Exam: GENERAL: Patient is alert awake and oriented. Not in obvious distress.  Morbidly obese, on nasal cannula today. HENT: No scleral pallor or icterus. Pupils equally reactive to light. Oral mucosa is moist NECK: is supple, no gross swelling noted. CHEST: Clear to auscultation. No crackles or wheezes.  Diminished breath sounds bilaterally.  Midsternal scar. CVS: S1 and S2 heard, no murmur. Regular rate and rhythm.  ABDOMEN: Soft, non specific tenderness over the epigastric region,, bowel sounds are present. EXTREMITIES: No edema. CNS: Cranial nerves are intact. No focal motor deficits. SKIN: warm and dry without rashes.  Data Review: I have personally reviewed the following laboratory data and studies,  CBC: Recent Labs  Lab 02/24/20 1826 02/25/20 1803 02/26/20 0326 02/27/20 0450  WBC 8.6  --  8.6 6.2  HGB 12.6 11.8* 11.2* 10.9*  HCT 40.5 39.0 36.6 35.6*  MCV 68.9*  --  69.1* 69.3*  PLT 279  --  228 825   Basic Metabolic Panel: Recent Labs  Lab 02/24/20 1826 02/26/20 0326 02/27/20 0450  NA 141 138 137  K 3.7 3.4* 3.7  CL 107 106 107  CO2 24 24 24   GLUCOSE 100* 108* 118*  BUN 8 5* <5*  CREATININE 1.00 1.17* 1.02*  CALCIUM 9.6 8.3* 8.1*  MG  --   --  1.6*   Liver Function Tests: Recent Labs  Lab 02/24/20 1826 02/26/20 0326  AST 17 22  ALT 10 13  ALKPHOS 75 66  BILITOT 0.6 0.6  PROT 7.2 6.2*  ALBUMIN 3.8 3.0*   Recent Labs  Lab 02/25/20 0830    LIPASE 23   No results for input(s): AMMONIA in the last 168 hours. Cardiac Enzymes: No results for input(s): CKTOTAL, CKMB, CKMBINDEX, TROPONINI in the last 168 hours. BNP (last 3 results) Recent Labs    06/03/19 1425 09/18/19 1211  BNP 16.5 25.7    ProBNP (last 3 results) No results for input(s): PROBNP in the last 8760 hours.  CBG: No results for input(s): GLUCAP in the last 168 hours. Recent Results (from the past 240 hour(s))  Urine Culture     Status: Abnormal   Collection Time: 02/25/20 10:19 AM   Specimen: Urine, Random  Result Value Ref Range Status   Specimen Description URINE, RANDOM  Final   Special Requests   Final    NONE Performed at [facility]  [facility]ospital Lab, [place] [address] [place], [place] [address]    Culture >=100,000 COLONIES/mL ESCHERICHIA COLI (A)  Final   Report Status 02/27/2020 FINAL  Final   Organism ID, Bacteria ESCHERICHIA COLI (A)  Final      Susceptibility   Escherichia coli - MIC*    AMPICILLIN >=32 RESISTANT Resistant     CEFAZOLIN 8 SENSITIVE Sensitive     CEFTRIAXONE <=0.25 SENSITIVE Sensitive     CIPROFLOXACIN <=0.25 SENSITIVE Sensitive     GENTAMICIN >=16 RESISTANT Resistant     IMIPENEM <=0.25 SENSITIVE Sensitive     NITROFURANTOIN <=16 SENSITIVE Sensitive     TRIMETH/SULFA >=320 RESISTANT Resistant     AMPICILLIN/SULBACTAM >=32 RESISTANT Resistant     PIP/TAZO <=4 SENSITIVE Sensitive     * >=100,000 COLONIES/mL ESCHERICHIA COLI  SARS Coronavirus 2 by RT PCR (hospital order, performed in [place] hospital lab) Nasopharyngeal Nasopharyngeal Swab     Status: None   Collection Time: 02/25/20 10:37 AM   Specimen: Nasopharyngeal Swab  Result Value Ref Range Status   SARS Coronavirus 2 NEGATIVE NEGATIVE Final    Comment: (NOTE) SARS-CoV-2 target nucleic acids are NOT DETECTED.  The SARS-CoV-2 RNA is generally detectable in upper and lower respiratory specimens during the acute phase of infection. The lowest concentration of  SARS-CoV-2 viral copies this assay can detect is 250 copies / mL. A negative result does not preclude SARS-CoV-2 infection and should not be used as the sole basis for treatment or other patient management decisions.  A negative result may occur with improper specimen collection / handling, submission of specimen other than nasopharyngeal swab, presence of viral mutation(s) within the areas targeted by this assay, and inadequate number of viral copies (<250 copies / mL). A negative result must be combined with clinical observations, patient history, and epidemiological information.  Fact Sheet for Patients:   StrictlyIdeas.no  Fact Sheet for Healthcare Providers: BankingDealers.co.za  This test is not yet approved or  cleared by the [place] FDA and has been authorized for detection and/or diagnosis of SARS-CoV-2 by FDA under an Emergency Use Authorization (EUA).  This EUA will remain in effect (meaning this test can be used) for the duration of the COVID-19 declaration under Section 564(b)(1) of the Act, 21 U.S.C. section 360bbb-3(b)(1), unless the authorization is terminated or revoked sooner.  Performed at [facility], [place] [address], [place], [place] [address]   Blood culture (routine x 2)     Status: Abnormal   Collection Time: 02/25/20  1:47 PM   Specimen: BLOOD  Result Value Ref Range Status   Specimen Description BLOOD SITE NOT SPECIFIED  Final   Special Requests   Final    BOTTLES DRAWN AEROBIC AND ANAEROBIC Blood Culture adequate volume   Culture  Setup Time   Final    GRAM POSITIVE RODS IN BOTH AEROBIC AND ANAEROBIC BOTTLES CRITICAL RESULT CALLED TO, READ BACK BY AND VERIFIED WITH: PHARMD Sharen Heck [phone]  MLM    Culture (A)  Final    BACILLUS SPECIES Standardized susceptibility testing for this organism is not available. Performed at [facility], [place] [address], [place],  [address]     Report Status 02/26/2020 FINAL  Final  Blood culture (routine x 2)     Status: None (Preliminary result)   Collection Time: 02/25/20  5:51 PM   Specimen: BLOOD  Result Value Ref Range Status   Specimen Description BLOOD RIGHT ANTECUBITAL  Final   Special  Requests   Final    BOTTLES DRAWN AEROBIC ONLY Blood Culture results may not be optimal due to an inadequate volume of blood received in culture bottles   Culture   Final    NO GROWTH 2 DAYS Performed at [facility], [place] [address] [place], [place] [address]    Report Status PENDING  Incomplete     Studies: CT ABDOMEN PELVIS W CONTRAST  Result Date: 02/25/2020 CLINICAL DATA:  59 year old female with history of right upper quadrant abdominal pain and nausea. Diffuse lower abdominal pain. Loss of appetite for 1 week. EXAM: CT ABDOMEN AND PELVIS WITH CONTRAST TECHNIQUE: Multidetector CT imaging of the abdomen and pelvis was performed using the standard protocol following bolus administration of intravenous contrast. CONTRAST:  160mL OMNIPAQUE IOHEXOL 300 MG/ML  SOLN COMPARISON:  CT the abdomen and pelvis 10/24/2016. FINDINGS: Lower chest: Linear scarring in the lower lobes of the lungs bilaterally. Hepatobiliary: No suspicious cystic or solid hepatic lesions. No intra or extrahepatic biliary ductal dilatation. Status post cholecystectomy. Pancreas: No pancreatic mass. No pancreatic ductal dilatation. No pancreatic or peripancreatic fluid collections or inflammatory changes. Spleen: Unremarkable. Adrenals/Urinary Tract: 2.5 x 1.5 cm right adrenal nodule, indeterminate. Left adrenal gland and right kidney are normal in appearance. Subcentimeter low-attenuation lesion in the interpolar region of the left kidney, too small to characterize, but statistically likely to represent a cyst. No hydroureteronephrosis. Urinary bladder is normal in appearance. Stomach/Bowel: Normal appearance of the stomach. No pathologic dilatation of small bowel or  colon. Normal appendix. Vascular/Lymphatic: Aortic atherosclerosis. No lymphadenopathy noted in the abdomen or pelvis. IVC filter in position with tip terminating immediately below the level of the renal veins. No lymphadenopathy noted in the abdomen or pelvis. Reproductive: Status post hysterectomy. Ovaries are not confidently identified may be surgically absent or atrophic. Other: No significant volume of ascites.  No pneumoperitoneum. Musculoskeletal: There are no aggressive appearing lytic or blastic lesions noted in the visualized portions of the skeleton. IMPRESSION: 1. No acute findings are noted in the abdomen or pelvis to account for the patient's symptoms. 2. Status post cholecystectomy. 3. Indeterminate right adrenal nodule measuring 2.5 x 1.5 cm, similar to remote prior study from 10/24/2016, likely to represent a benign lesion such as an adenoma. 4. Aortic atherosclerosis. 5. Additional incidental findings, as above. Electronically Signed   By: Vinnie Langton M.D.   On: 02/25/2020 12:41      Flora Lipps, MD  [facility] 02/27/2020

## 2020-02-27 NOTE — Progress Notes (Signed)
MD ordered iv antibiotics

## 2020-02-28 ENCOUNTER — Encounter (HOSPITAL_COMMUNITY): Payer: Self-pay | Admitting: Gastroenterology

## 2020-02-28 ENCOUNTER — Other Ambulatory Visit: Payer: Self-pay | Admitting: Physician Assistant

## 2020-02-28 LAB — BASIC METABOLIC PANEL
Anion gap: 6 (ref 5–15)
BUN: <5 mg/dL — ABNORMAL LOW (ref 6–20)
CO2: 27 mmol/L (ref 22–32)
Calcium: 8.5 mg/dL — ABNORMAL LOW (ref 8.9–10.3)
Chloride: 108 mmol/L (ref 98–111)
Creatinine, Ser: 1.03 mg/dL — ABNORMAL HIGH (ref 0.44–1.00)
GFR calc Af Amer: >60 mL/min (ref >60)
GFR calc non Af Amer: >60 mL/min (ref >60)
Glucose, Bld: 136 mg/dL — ABNORMAL HIGH (ref 70–99)
Potassium: 3.6 mmol/L (ref 3.5–5.1)
Sodium: 141 mmol/L (ref 135–145)

## 2020-02-28 LAB — CBC
HCT: 34.5 % — ABNORMAL LOW (ref 36.0–46.0)
Hemoglobin: 10.7 g/dL — ABNORMAL LOW (ref 12.0–15.0)
MCH: 21.3 pg — ABNORMAL LOW (ref 26.0–34.0)
MCHC: 31 g/dL (ref 30.0–36.0)
MCV: 68.6 fL — ABNORMAL LOW (ref 80.0–100.0)
Platelets: 229 10*3/uL (ref 150–400)
RBC: 5.03 MIL/uL (ref 3.87–5.11)
RDW: 18.5 % — ABNORMAL HIGH (ref 11.5–15.5)
WBC: 6.8 10*3/uL (ref 4.0–10.5)
nRBC: 0 % (ref 0.0–0.2)

## 2020-02-28 LAB — MAGNESIUM: Magnesium: 1.9 mg/dL (ref 1.7–2.4)

## 2020-02-28 MED ORDER — HEPARIN SOD (PORK) LOCK FLUSH 100 UNIT/ML IV SOLN
500.0000 [IU] | INTRAVENOUS | Status: AC | PRN
  Administered 2020-02-28: 500 [IU]
  Filled 2020-02-28: qty 5

## 2020-02-28 MED ORDER — CEPHALEXIN 500 MG PO CAPS: 500.0000 mg | ORAL_CAPSULE | Freq: Four times a day (QID) | ORAL | 0 refills | Status: AC

## 2020-02-28 MED ORDER — CIPROFLOXACIN HCL 500 MG PO TABS
500.0000 mg | ORAL_TABLET | Freq: Two times a day (BID) | ORAL | 0 refills | Status: DC
Start: 2020-02-28 — End: 2020-02-28

## 2020-02-28 NOTE — Discharge Summary (Signed)
Physician Discharge Summary  [name] [name] FIE:[id number] DOB: [birth year] DOA: 02/24/2020  PCP: Ruthine Dose, MD  Admit date: 02/24/2020 Discharge date: 02/28/2020  Admitted From: Home  Discharge disposition: home   Recommendations for Outpatient Follow-Up:   . Follow up with your primary care provider in one week.  . Check CBC, BMP, magnesium in the next visit. . Follow-up with Dr. Alessandra Bevels GI as scheduled by you. . Follow-up with your cardiologist as scheduled by you   Discharge Diagnosis:   Principal Problem:   GI bleed Active Problems:   Chronic pain syndrome   OSA on CPAP   CTEPH (chronic thromboembolic pulmonary hypertension) (HCC)   Essential hypertension   Atypical chest pain   Morbid obesity (HCC)   Rheumatoid arthritis involving multiple sites [facility]) UTI   Discharge Condition: Improved.  Diet recommendation: Low sodium, heart healthy.    Wound care: None.  Code status: Full.   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 Patient had similar chest pain occasionally since 2017 as has been following with cardiologist who considered the chest pain is related to her severe pulmonary HTN. In the ED,CTabdomen showed no acute findings.She spiked fever in the ED, urine showed UTI. WBC 8.6 hemoglobin 12.6, BUN 8, creatinine 1.0.  Patient was then admitted to the hospital for GI bleed UTI  Hospital Course:   Following conditions were addressed during hospitalization as listed below,  GI bleed with melena. As per history.  Patient does have history of EGD, colonoscopy and capsule  endoscopy on 11/19 and was noted to have gastric ulcer.  Patient underwent PET endoscopic evaluation on 02/26/20 with unremarkable findings except for mild gastritis and biopsy from the antrum was taken for H. pylori..  Patient follows up with GI as outpatient.  Has been advanced on regular diet which she has tolerated.  Hemoglobin has remained pretty much stable.  No further episodes of melena while in the hospital.  GI recommended to start Xarelto and follow-up with GI as outpatient.   Fever, tachycardia, borderline hypotension secondary to E. coli UTI.    On presentation.  Patient received IV Rocephin while in the hospital.  Will be changed to Keflex on discharge to complete the course.  Temperature max prior to discharge was 98.8 F.  No leukocytosis.  Gram-positive rod bacteremia -bacillus species.  1 blood culture.   No further intervention.  Mild hypokalemia.    Replenished.  Potassium of 3.6 prior to discharge.   Acute on chronic chest pain, atypical chest pain. No chest pain after admission. History of similar problem before.  Seen by cardiology in the past.  Patient does have chronic pulmonary hypertension.She was [facility]hed in [facility] showednonobstructive CAD, and she was diagnosed with PE status post embolectomy at the same time which was treated with systemic anticoagulation as well as IVC filter at the same time in 2017.  Patient will be resumed on Xarelto.  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  Has been resumed on Xarelto.  Hemoglobin of 10.7 on the day of discharge.  Will need outpatient CBC monitoring.   History of diastolic CHF,  chronic -Continue Lipitor,    follows up with Dr. Haroldine Laws as outpatient.   Mild hypomagnesemia.    Replenished and normalized.  Disposition.  At this time, patient is stable for disposition home.  Patient was advised to follow-up with her primary care physician and GI as  outpatient.  Medical Consultants:    GI  Procedures:     EGD with biopsy on 02/26/2020. Subjective:   Today, patient remained at bedside.  Patient feels much better today.  Has improved strength.  Denies any nausea, vomiting or abdominal pain.  Has not had any melena.  Denies urinary urgency, frequency or dysuria.  Discharge Exam:   Vitals:   02/27/20 2133 02/28/20 0417  BP: 109/72 117/79  Pulse: 79 89  Resp: 17 19  Temp: 98.6 F (37 C) 98.3 F (36.8 C)  SpO2: 97% 100%   Vitals:   02/27/20 1800 02/27/20 2020 02/27/20 2133 02/28/20 0417  BP: 110/74  109/72 117/79  Pulse:   79 89  Resp: 16 18 17 19   Temp: 98.8 F (37.1 C)  98.6 F (37 C) 98.3 F (36.8 C)  TempSrc: Oral  Oral Oral  SpO2: 96%  97% 100%  Weight:      Height:       General: Alert awake, not in obvious distress, morbidly obese, HENT: pupils equally reacting to light,  No scleral pallor or icterus noted. Oral mucosa is moist.  Chest:  Clear breath sounds.  Diminished breath sounds bilaterally. No crackles or wheezes.  Midsternal scar. CVS: S1 &S2 heard. No murmur.  Regular rate and rhythm. Abdomen: Soft, nontender, nondistended.  Bowel sounds are heard.   Extremities: No cyanosis, clubbing or edema.  Peripheral pulses are palpable. Psych: Alert, awake and oriented, normal mood CNS:  No cranial nerve deficits.  Power equal in all extremities.   Skin: Warm and dry.  No rashes noted.  The results of significant diagnostics from this hospitalization (including imaging, microbiology, ancillary and laboratory) are listed below for reference.     Diagnostic Studies:   CT ABDOMEN PELVIS W CONTRAST  Result Date: 02/25/2020 CLINICAL DATA:  59 year old female with history of right upper quadrant abdominal pain and nausea. Diffuse lower abdominal pain. Loss of appetite for 1 week. EXAM: CT ABDOMEN AND PELVIS WITH CONTRAST TECHNIQUE: Multidetector CT imaging of the abdomen and pelvis was performed using the  standard protocol following bolus administration of intravenous contrast. CONTRAST:  134mL OMNIPAQUE IOHEXOL 300 MG/ML  SOLN COMPARISON:  CT the abdomen and pelvis 10/24/2016. FINDINGS: Lower chest: Linear scarring in the lower lobes of the lungs bilaterally. Hepatobiliary: No suspicious cystic or solid hepatic lesions. No intra or extrahepatic biliary ductal dilatation. Status post cholecystectomy. Pancreas: No pancreatic mass. No pancreatic ductal dilatation. No pancreatic or peripancreatic fluid collections or inflammatory changes. Spleen: Unremarkable. Adrenals/Urinary Tract: 2.5 x 1.5 cm right adrenal nodule, indeterminate. Left adrenal gland and right kidney are normal in appearance. Subcentimeter low-attenuation lesion in the interpolar region of the left kidney, too small to characterize, but statistically likely to represent a cyst. No hydroureteronephrosis. Urinary bladder is normal in appearance. Stomach/Bowel: Normal appearance of the stomach. No pathologic dilatation of small bowel or colon. Normal appendix. Vascular/Lymphatic: Aortic atherosclerosis. No lymphadenopathy noted in the abdomen or pelvis. IVC filter in position with tip terminating immediately below the level of the renal veins. No lymphadenopathy noted in the abdomen or pelvis. Reproductive: Status post hysterectomy. Ovaries are not confidently identified may be surgically absent or atrophic. Other: No significant  volume of ascites.  No pneumoperitoneum. Musculoskeletal: There are no aggressive appearing lytic or blastic lesions noted in the visualized portions of the skeleton. IMPRESSION: 1. No acute findings are noted in the abdomen or pelvis to account for the patient's symptoms. 2. Status post cholecystectomy. 3. Indeterminate right adrenal nodule measuring 2.5 x 1.5 cm, similar to remote prior study from 10/24/2016, likely to represent a benign lesion such as an adenoma. 4. Aortic atherosclerosis. 5. Additional incidental findings, as  above. Electronically Signed   By: Vinnie Langton M.D.   On: 02/25/2020 12:41   DG Chest Portable 1 View  Result Date: 02/25/2020 CLINICAL DATA:  59 year old female with history of cough, shortness of breath and chest pain. EXAM: PORTABLE CHEST 1 VIEW COMPARISON:  Chest x-ray 09/28/2019. FINDINGS: Right subclavian single-lumen porta cath with tip terminating at the superior cavoatrial junction lung volumes are slightly low. Mild elevation of the right hemidiaphragm is unchanged. No acute consolidative airspace disease. No pleural effusions. No evidence of pulmonary edema. No pneumothorax. No definite suspicious appearing pulmonary nodules or masses are noted. Heart size is upper limits of normal. Upper mediastinal contours are within normal limits. Aortic atherosclerosis. Status post median sternotomy. IMPRESSION: 1. No radiographic evidence of acute cardiopulmonary disease. 2. Aortic atherosclerosis. Electronically Signed   By: Vinnie Langton M.D.   On: 02/25/2020 08:32     Labs:   Basic Metabolic Panel: Recent Labs  Lab 02/24/20 1826 02/24/20 1826 02/26/20 0326 02/26/20 0326 02/27/20 0450 02/28/20 0420  NA 141  --  138  --  137 141  K 3.7   < > 3.4*   < > 3.7 3.6  CL 107  --  106  --  107 108  CO2 24  --  24  --  24 27  GLUCOSE 100*  --  108*  --  118* 136*  BUN 8  --  5*  --  <5* <5*  CREATININE 1.00  --  1.17*  --  1.02* 1.03*  CALCIUM 9.6  --  8.3*  --  8.1* 8.5*  MG  --   --   --   --  1.6* 1.9   < > = values in this interval not displayed.   GFR Estimated Creatinine Clearance: 87.3 mL/min (A) (by C-G formula based on SCr of 1.03 mg/dL (H)). Liver Function Tests: Recent Labs  Lab 02/24/20 1826 02/26/20 0326  AST 17 22  ALT 10 13  ALKPHOS 75 66  BILITOT 0.6 0.6  PROT 7.2 6.2*  ALBUMIN 3.8 3.0*   Recent Labs  Lab 02/25/20 0830  LIPASE 23   No results for input(s): AMMONIA in the last 168 hours. Coagulation profile No results for input(s): INR, PROTIME in the  last 168 hours.  CBC: Recent Labs  Lab 02/24/20 1826 02/25/20 1803 02/26/20 0326 02/27/20 0450 02/28/20 0420  WBC 8.6  --  8.6 6.2 6.8  HGB 12.6 11.8* 11.2* 10.9* 10.7*  HCT 40.5 39.0 36.6 35.6* 34.5*  MCV 68.9*  --  69.1* 69.3* 68.6*  PLT 279  --  228 204 229   Cardiac Enzymes: No results for input(s): CKTOTAL, CKMB, CKMBINDEX, TROPONINI in the last 168 hours. BNP: Invalid input(s): POCBNP CBG: No results for input(s): GLUCAP in the last 168 hours. D-Dimer No results for input(s): DDIMER in the last 72 hours. Hgb A1c No results for input(s): HGBA1C in the last 72 hours. Lipid Profile No results for input(s): CHOL, HDL, LDLCALC, TRIG, CHOLHDL, LDLDIRECT in the last  72 hours. Thyroid function studies No results for input(s): TSH, T4TOTAL, T3FREE, THYROIDAB in the last 72 hours.  Invalid input(s): FREET3 Anemia work up No results for input(s): VITAMINB12, FOLATE, FERRITIN, TIBC, IRON, RETICCTPCT in the last 72 hours. Microbiology Recent Results (from the past 240 hour(s))  Urine Culture     Status: Abnormal   Collection Time: 02/25/20 10:19 AM   Specimen: Urine, Random  Result Value Ref Range Status   Specimen Description URINE, RANDOM  Final   Special Requests   Final    NONE Performed at [facility], [address] [place], [place] [address]    Culture >=100,000 COLONIES/mL ESCHERICHIA COLI (A)  Final   Report Status 02/27/2020 FINAL  Final   Organism ID, Bacteria ESCHERICHIA COLI (A)  Final      Susceptibility   Escherichia coli - MIC*    AMPICILLIN >=32 RESISTANT Resistant     CEFAZOLIN 8 SENSITIVE Sensitive     CEFTRIAXONE <=0.25 SENSITIVE Sensitive     CIPROFLOXACIN <=0.25 SENSITIVE Sensitive     GENTAMICIN >=16 RESISTANT Resistant     IMIPENEM <=0.25 SENSITIVE Sensitive     NITROFURANTOIN <=16 SENSITIVE Sensitive     TRIMETH/SULFA >=320 RESISTANT Resistant     AMPICILLIN/SULBACTAM >=32 RESISTANT Resistant     PIP/TAZO <=4 SENSITIVE Sensitive      * >=100,000 COLONIES/mL ESCHERICHIA COLI  SARS Coronavirus 2 by RT PCR (hospital order, performed in [place] hospital lab) Nasopharyngeal Nasopharyngeal Swab     Status: None   Collection Time: 02/25/20 10:37 AM   Specimen: Nasopharyngeal Swab  Result Value Ref Range Status   SARS Coronavirus 2 NEGATIVE NEGATIVE Final    Comment: (NOTE) SARS-CoV-2 target nucleic acids are NOT DETECTED.  The SARS-CoV-2 RNA is generally detectable in upper and lower respiratory specimens during the acute phase of infection. The lowest concentration of SARS-CoV-2 viral copies this assay can detect is 250 copies / mL. A negative result does not preclude SARS-CoV-2 infection and should not be used as the sole basis for treatment or other patient management decisions.  A negative result may occur with improper specimen collection / handling, submission of specimen other than nasopharyngeal swab, presence of viral mutation(s) within the areas targeted by this assay, and inadequate number of viral copies (<250 copies / mL). A negative result must be combined with clinical observations, patient history, and epidemiological information.  Fact Sheet for Patients:   StrictlyIdeas.no  Fact Sheet for Healthcare Providers: BankingDealers.co.za  This test is not yet approved or  cleared by the [place] FDA and has been authorized for detection and/or diagnosis of SARS-CoV-2 by FDA under an Emergency Use Authorization (EUA).  This EUA will remain in effect (meaning this test can be used) for the duration of the COVID-19 declaration under Section 564(b)(1) of the Act, 21 U.S.C. section 360bbb-3(b)(1), unless the authorization is terminated or revoked sooner.  Performed at [facility], [place] [address], [place], [place] [address]   Blood culture (routine x 2)     Status: Abnormal   Collection Time: 02/25/20  1:47 PM   Specimen: BLOOD  Result Value Ref  Range Status   Specimen Description BLOOD SITE NOT SPECIFIED  Final   Special Requests   Final    BOTTLES DRAWN AEROBIC AND ANAEROBIC Blood Culture adequate volume   Culture  Setup Time   Final    GRAM POSITIVE RODS IN BOTH AEROBIC AND ANAEROBIC BOTTLES CRITICAL RESULT CALLED TO, READ BACK  BY AND VERIFIED WITH: Milford Cage [id number]  MLM    Culture (A)  Final    BACILLUS SPECIES Standardized susceptibility testing for this organism is not available. Performed at [facility], [place] [address] [place], [place] [address]    Report Status 02/26/2020 FINAL  Final  Blood culture (routine x 2)     Status: None (Preliminary result)   Collection Time: 02/25/20  5:51 PM   Specimen: BLOOD  Result Value Ref Range Status   Specimen Description BLOOD RIGHT ANTECUBITAL  Final   Special Requests   Final    BOTTLES DRAWN AEROBIC ONLY Blood Culture results may not be optimal due to an inadequate volume of blood received in culture bottles   Culture   Final    NO GROWTH 3 DAYS Performed at [facility], [place] [address] [place], [place] [address]    Report Status PENDING  Incomplete     Discharge Instructions:   Discharge Instructions    Call MD for:  persistant nausea and vomiting   Complete by: As directed    Call MD for:  severe uncontrolled pain   Complete by: As directed    Diet - low sodium heart healthy   Complete by: As directed    Discharge instructions   Complete by: As directed    Follow-up with your primary care provider in 1 week.  Complete the course of antibiotic.  Check blood work at that time. Follow-up with your GI physician and cardiology as scheduled by you.  Seek medical attention for worsening symptoms   Increase activity slowly   Complete by: As directed      Allergies as of 02/28/2020   No Known Allergies     Medication List    TAKE these medications   acetaminophen-codeine 300-60 MG tablet Commonly known as: TYLENOL #4 Take 1 tablet by  mouth every 4 (four) hours as needed for pain.   Adempas 2 MG Tabs Generic drug: Riociguat Take 2 mg by mouth 3 (three) times daily.   albuterol 108 (90 Base) MCG/ACT inhaler Commonly known as: VENTOLIN HFA Inhale 2 puffs into the lungs every 6 (six) hours as needed for wheezing or shortness of breath.   atorvastatin 20 MG tablet Commonly known as: LIPITOR Take 1 tablet (20 mg total) by mouth daily at 6 PM.   cephALEXin 500 MG capsule Commonly known as: KEFLEX Take 1 capsule (500 mg total) by mouth 4 (four) times daily for 3 days. Start taking on 02/29/20 Start taking on: February 29, 2020   cyclobenzaprine 10 MG tablet Commonly known as: FLEXERIL Take 10 mg by mouth 3 (three) times daily as needed for muscle spasms.   diclofenac sodium 1 % Gel Commonly known as: VOLTAREN Apply 2 g topically 4 (four) times daily as needed (knee and back pain).   dicyclomine 10 MG capsule Commonly known as: BENTYL Take 10 mg by mouth 3 (three) times daily as needed. Stomach cramps   docusate sodium 100 MG capsule Commonly known as: COLACE Take 1 capsule (100 mg total) by mouth every 12 (twelve) hours.   fluticasone 50 MCG/ACT nasal spray Commonly known as: FLONASE Place 2 sprays into both nostrils daily. What changed:   when to take this  reasons to take this   furosemide 40 MG tablet Commonly known as: LASIX Take 1 tablet (40 mg total) by mouth daily. Take extra 40 mg tablet once in the afternoon AS NEEDED for weight gain  3 lbs or more.   gabapentin 300 MG capsule Commonly known as: NEURONTIN Take 600 mg by mouth 3 (three) times daily.   LORazepam 0.5 MG tablet Commonly known as: ATIVAN Take 0.5 mg by mouth every 8 (eight) hours as needed for anxiety or sleep.   meclizine 25 MG tablet Commonly known as: ANTIVERT Take 25 mg by mouth 2 (two) times daily as needed for dizziness.   melatonin 5 MG Tabs Take 5 mg by mouth at bedtime.   methocarbamol 500 MG tablet Commonly known  as: Robaxin Take 1 tablet (500 mg total) by mouth 2 (two) times daily as needed.   mirtazapine 45 MG tablet Commonly known as: REMERON Take 45 mg by mouth at bedtime. Total dosage is 60 mg   mirtazapine 15 MG tablet Commonly known as: REMERON Take 15 mg by mouth at bedtime. Total dosage is 60 mg   montelukast 10 MG tablet Commonly known as: SINGULAIR Take 10 mg by mouth every evening.   omeprazole 40 MG capsule Commonly known as: PRILOSEC Take 40 mg by mouth 2 (two) times daily.   polyethylene glycol powder 17 GM/SCOOP powder Commonly known as: GLYCOLAX/MIRALAX Take 17 g by mouth 2 (two) times daily as needed. What changed: reasons to take this   spironolactone 25 MG tablet Commonly known as: ALDACTONE Take 12.5 mg by mouth every other day.   traMADol 50 MG tablet Commonly known as: ULTRAM Take 1-2 tabs po bid prn pain   traZODone 150 MG tablet Commonly known as: DESYREL Take 150 mg by mouth at bedtime as needed for sleep.   Xarelto 20 MG Tabs tablet Generic drug: rivaroxaban TAKE 1 TABLET BY MOUTH ONCE DAILY WITH SUPPER What changed: See the new instructions.       Follow-up Information    Ruthine Dose, MD. Schedule an appointment as soon as possible for a visit in 1 week(s).   Specialty: Family Medicine Why: check blood work, regular followup Contact information: [place] [address] [phone]                Time coordinating discharge: 39 minutes  Signed:  Dominik Yordy  [facility] 02/28/2020, 4:02 PM

## 2020-02-28 NOTE — Plan of Care (Signed)
  Problem: Education: Goal: Knowledge of General Education information will improve Description Including pain rating scale, medication(s)/side effects and non-pharmacologic comfort measures Outcome: Progressing   

## 2020-02-28 NOTE — Discharge Instructions (Signed)
Cephalexin Tablets or Capsules What is this medicine? CEPHALEXIN (sef a LEX in) is a cephalosporin antibiotic. It treats some infections caused by bacteria. It will not work for colds, the flu, or other viruses. This medicine may be used for other purposes; ask your health care provider or pharmacist if you have questions. COMMON BRAND NAME(S): Biocef, Daxbia, Keflex, Keftab What should I tell my health care provider before I take this medicine? They need to know if you have any of these conditions:  kidney disease  stomach or intestine problems, especially colitis  an unusual or allergic reaction to cephalexin, other cephalosporins, penicillins, other antibiotics, medicines, foods, dyes or preservatives  pregnant or trying to get pregnant  breast-feeding How should I use this medicine? Take this drug by mouth. Take it as directed on the prescription label at the same time every day. You can take it with or without food. If it upsets your stomach, take it with food. Take all of this drug unless your health care provider tells you to stop it early. Keep taking it even if you think you are better. Talk to your health care provider about the use of this drug in children. While it may be prescribed for selected conditions, precautions do apply. Overdosage: If you think you have taken too much of this medicine contact a poison control center or emergency room at once. NOTE: This medicine is only for you. Do not share this medicine with others. What if I miss a dose? If you miss a dose, take it as soon as you can. If it is almost time for your next dose, take only that dose. Do not take double or extra doses. What may interact with this medicine?  probenecid  some other antibiotics This list may not describe all possible interactions. Give your health care provider a list of all the medicines, herbs, non-prescription drugs, or dietary supplements you use. Also tell them if you smoke, drink  alcohol, or use illegal drugs. Some items may interact with your medicine. What should I watch for while using this medicine? Tell your doctor or health care provider if your symptoms do not begin to improve in a few days. This medicine may cause serious skin reactions. They can happen weeks to months after starting the medicine. Contact your health care provider right away if you notice fevers or flu-like symptoms with a rash. The rash may be red or purple and then turn into blisters or peeling of the skin. Or, you might notice a red rash with swelling of the face, lips or lymph nodes in your neck or under your arms. Do not treat diarrhea with over the counter products. Contact your doctor if you have diarrhea that lasts more than 2 days or if it is severe and watery. If you have diabetes, you may get a false-positive result for sugar in your urine. Check with your doctor or health care provider. What side effects may I notice from receiving this medicine? Side effects that you should report to your doctor or health care professional as soon as possible:  allergic reactions like skin rash, itching or hives, swelling of the face, lips, or tongue  breathing problems  pain or trouble passing urine  redness, blistering, peeling or loosening of the skin, including inside the mouth  severe or watery diarrhea  unusually weak or tired  yellowing of the eyes, skin Side effects that usually do not require medical attention (report to your doctor or health care professional   if they continue or are bothersome):  gas or heartburn  genital or anal irritation  headache  joint or muscle pain  nausea, vomiting This list may not describe all possible side effects. Call your doctor for medical advice about side effects. You may report side effects to FDA at 1-800-FDA-1088. Where should I keep my medicine? Keep out of the reach of children and pets. Store at room temperature between 20 and 25  degrees C (68 and 77 degrees F). Throw away any unused drug after the expiration date. NOTE: This sheet is a summary. It may not cover all possible information. If you have questions about this medicine, talk to your doctor, pharmacist, or health care provider.  2020 Elsevier/Gold Standard (2019-04-02 11:27:00)  

## 2020-02-29 LAB — SURGICAL PATHOLOGY

## 2020-03-01 LAB — CULTURE, BLOOD (ROUTINE X 2): Culture: NO GROWTH

## 2020-03-23 ENCOUNTER — Encounter (HOSPITAL_COMMUNITY): Payer: Medicare HMO

## 2020-04-04 ENCOUNTER — Encounter (HOSPITAL_COMMUNITY): Payer: Medicare HMO

## 2020-05-02 ENCOUNTER — Other Ambulatory Visit (HOSPITAL_COMMUNITY): Payer: Self-pay

## 2020-05-02 ENCOUNTER — Other Ambulatory Visit (HOSPITAL_COMMUNITY): Payer: Self-pay | Admitting: *Deleted

## 2020-05-02 MED ORDER — ADEMPAS 2 MG PO TABS: 2.0000 mg | ORAL_TABLET | Freq: Three times a day (TID) | ORAL | 0 refills | Status: DC

## 2020-05-02 MED ORDER — RIVAROXABAN 20 MG PO TABS: ORAL_TABLET | ORAL | 1 refills | Status: DC

## 2020-05-05 ENCOUNTER — Encounter (HOSPITAL_COMMUNITY): Payer: Medicare HMO

## 2020-05-17 ENCOUNTER — Other Ambulatory Visit: Payer: Self-pay | Admitting: General Practice

## 2020-05-17 DIAGNOSIS — Z1231 Encounter for screening mammogram for malignant neoplasm of breast: Secondary | ICD-10-CM

## 2020-05-30 ENCOUNTER — Other Ambulatory Visit: Payer: Self-pay | Admitting: General Practice

## 2020-05-30 DIAGNOSIS — Z1231 Encounter for screening mammogram for malignant neoplasm of breast: Secondary | ICD-10-CM

## 2020-06-01 ENCOUNTER — Ambulatory Visit (HOSPITAL_COMMUNITY)
Admission: RE | Admit: 2020-06-01 | Discharge: 2020-06-01 | Disposition: A | Discharge status: Home / Self Care | Payer: Medicare HMO | Attending: Internal Medicine | Admitting: Internal Medicine

## 2020-06-01 ENCOUNTER — Other Ambulatory Visit: Payer: Self-pay

## 2020-06-01 DIAGNOSIS — I509 Heart failure, unspecified: Secondary | ICD-10-CM | POA: Insufficient documentation

## 2020-06-01 DIAGNOSIS — Z79899 Other long term (current) drug therapy: Secondary | ICD-10-CM | POA: Diagnosis present

## 2020-06-01 DIAGNOSIS — E785 Hyperlipidemia, unspecified: Secondary | ICD-10-CM | POA: Insufficient documentation

## 2020-06-01 LAB — COMPREHENSIVE METABOLIC PANEL
ALT: 10 U/L (ref 0–44)
AST: 10 U/L — ABNORMAL LOW (ref 15–41)
Albumin: 3.6 g/dL (ref 3.5–5.0)
Alkaline Phosphatase: 67 U/L (ref 38–126)
Anion gap: 12 (ref 5–15)
BUN: 10 mg/dL (ref 6–20)
CO2: 26 mmol/L (ref 22–32)
Calcium: 9.2 mg/dL (ref 8.9–10.3)
Chloride: 106 mmol/L (ref 98–111)
Creatinine, Ser: 0.84 mg/dL (ref 0.44–1.00)
GFR calc Af Amer: >60 mL/min (ref >60)
GFR calc non Af Amer: >60 mL/min (ref >60)
Glucose, Bld: 100 mg/dL — ABNORMAL HIGH (ref 70–99)
Potassium: 3.5 mmol/L (ref 3.5–5.1)
Sodium: 144 mmol/L (ref 135–145)
Total Bilirubin: 0.5 mg/dL (ref 0.3–1.2)
Total Protein: 7.2 g/dL (ref 6.5–8.1)

## 2020-06-01 LAB — CBC WITH DIFFERENTIAL/PLATELET
Abs Immature Granulocytes: 0.01 10*3/uL (ref 0.00–0.07)
Basophils Absolute: 0 10*3/uL (ref 0.0–0.1)
Basophils Relative: 0 %
Eosinophils Absolute: 0.1 10*3/uL (ref 0.0–0.5)
Eosinophils Relative: 1 %
HCT: 36.6 % (ref 36.0–46.0)
Hemoglobin: 11.2 g/dL — ABNORMAL LOW (ref 12.0–15.0)
Immature Granulocytes: 0 %
Lymphocytes Relative: 27 %
Lymphs Abs: 1.5 10*3/uL (ref 0.7–4.0)
MCH: 20.6 pg — ABNORMAL LOW (ref 26.0–34.0)
MCHC: 30.6 g/dL (ref 30.0–36.0)
MCV: 67.4 fL — ABNORMAL LOW (ref 80.0–100.0)
Monocytes Absolute: 0.3 10*3/uL (ref 0.1–1.0)
Monocytes Relative: 5 %
Neutro Abs: 3.7 10*3/uL (ref 1.7–7.7)
Neutrophils Relative %: 67 %
Platelets: 314 10*3/uL (ref 150–400)
RBC: 5.43 MIL/uL — ABNORMAL HIGH (ref 3.87–5.11)
RDW: 19.6 % — ABNORMAL HIGH (ref 11.5–15.5)
WBC: 5.6 10*3/uL (ref 4.0–10.5)
nRBC: 0 % (ref 0.0–0.2)

## 2020-06-01 LAB — LIPID PANEL
Cholesterol: 114 mg/dL (ref 0–200)
HDL: 30 mg/dL — ABNORMAL LOW (ref >40)
LDL Cholesterol: 67 mg/dL (ref 0–99)
Total CHOL/HDL Ratio: 3.8 RATIO
Triglycerides: 84 mg/dL (ref <150)
VLDL: 17 mg/dL (ref 0–40)

## 2020-06-01 LAB — HEPATITIS C ANTIBODY: HCV Ab: NONREACTIVE

## 2020-06-01 LAB — HEMOGLOBIN A1C
Hgb A1c MFr Bld: 6.4 % — ABNORMAL HIGH (ref 4.8–5.6)
Mean Plasma Glucose: 136.98 mg/dL

## 2020-06-01 LAB — TSH: TSH: 0.484 u[IU]/mL (ref 0.350–4.500)

## 2020-06-01 MED ORDER — HEPARIN SOD (PORK) LOCK FLUSH 100 UNIT/ML IV SOLN
500.0000 [IU] | INTRAVENOUS | Status: AC | PRN
  Administered 2020-06-01: 500 [IU]

## 2020-06-01 MED ORDER — SODIUM CHLORIDE 0.9% FLUSH
10.0000 mL | INTRAVENOUS | Status: AC | PRN
  Administered 2020-06-01: 10 mL

## 2020-06-01 NOTE — Progress Notes (Signed)
PATIENT CARE CENTER NOTE    Provider:Johnson, Neoma Laming, MD   Procedure:Port-a-cath flush and lab draw   Note:Port-a-cath accessed and flushed using sterile technique.  Labs drawn from [facility]  per Andree Moro, MD orders. Patient tolerated well.PAC deaccessed.Patient refused AVS. Patient alert, oriented and ambulatory at discharge

## 2020-06-02 LAB — PTH, INTACT AND CALCIUM
Calcium, Total (PTH): 9.1 mg/dL (ref 8.7–10.2)
PTH: 72 pg/mL — ABNORMAL HIGH (ref 15–65)

## 2020-06-05 ENCOUNTER — Other Ambulatory Visit (HOSPITAL_COMMUNITY): Payer: Self-pay

## 2020-06-05 MED ORDER — ADEMPAS 2 MG PO TABS: 2.0000 mg | ORAL_TABLET | Freq: Three times a day (TID) | ORAL | 1 refills | Status: DC

## 2020-06-07 ENCOUNTER — Telehealth: Payer: Self-pay

## 2020-06-07 NOTE — Telephone Encounter (Signed)
Hey Dr Loletha Carrow, this pt is being referred by Dr Posey Pronto for gastritis. It looks like this pt was last seen by Dr Alessandra Bevels 05/2019 and Dr Therisa Doyne 02/2020. Records are in epic for review. Please advise on scheduling.

## 2020-06-08 NOTE — Telephone Encounter (Signed)
Perhaps the referring physician is not aware that this patient has been seen by the [facility] group as recently as 3 months ago.  I am afraid that the current size of my practice and backlog of new patients does not allow transfers of care from other local GI practices.  With that, and also for continuity of care, I recommend she contact the [facility] GI practice for follow-up.  - HD

## 2020-06-29 ENCOUNTER — Encounter (HOSPITAL_COMMUNITY): Payer: Medicare HMO

## 2020-06-29 ENCOUNTER — Ambulatory Visit: Payer: Medicare HMO

## 2020-07-04 ENCOUNTER — Ambulatory Visit (HOSPITAL_COMMUNITY)
Admission: RE | Admit: 2020-07-04 | Discharge: 2020-07-04 | Disposition: A | Discharge status: Home / Self Care | Payer: Medicare HMO | Attending: Internal Medicine | Admitting: Internal Medicine

## 2020-07-04 ENCOUNTER — Other Ambulatory Visit: Payer: Self-pay

## 2020-07-04 DIAGNOSIS — Z452 Encounter for adjustment and management of vascular access device: Secondary | ICD-10-CM | POA: Insufficient documentation

## 2020-07-04 MED ORDER — SODIUM CHLORIDE 0.9% FLUSH
10.0000 mL | INTRAVENOUS | Status: AC | PRN
  Administered 2020-07-04: 10 mL

## 2020-07-04 MED ORDER — HEPARIN SOD (PORK) LOCK FLUSH 100 UNIT/ML IV SOLN
500.0000 [IU] | INTRAVENOUS | Status: AC | PRN
  Administered 2020-07-04: 500 [IU]

## 2020-07-04 NOTE — Progress Notes (Addendum)
PATIENT CARE CENTER NOTE    Provider:Johnson, Neoma Laming, MD   Procedure:Port-a-cath flush   Note:Port-a-cath accessed and flushed with Heparin and 0.9% Sodium Chloride.PAC deaccessed.Patient tolerated well. Sterile technique used throughout procedure. Patient refused AVS. Patient alert, oriented and ambulatory at discharge

## 2020-07-19 ENCOUNTER — Other Ambulatory Visit (HOSPITAL_COMMUNITY): Payer: Self-pay

## 2020-07-19 ENCOUNTER — Other Ambulatory Visit: Payer: Self-pay

## 2020-07-19 ENCOUNTER — Ambulatory Visit (HOSPITAL_COMMUNITY)
Admission: RE | Admit: 2020-07-19 | Discharge: 2020-07-19 | Disposition: A | Discharge status: Home / Self Care | Payer: Medicare HMO | Source: Ambulatory Visit | Attending: Internal Medicine | Admitting: Internal Medicine

## 2020-07-19 ENCOUNTER — Encounter (HOSPITAL_COMMUNITY): Payer: Self-pay | Admitting: Internal Medicine

## 2020-07-19 VITALS — BP 124/80 | HR 93 | Wt 332.4 lb

## 2020-07-19 DIAGNOSIS — F329 Major depressive disorder, single episode, unspecified: Secondary | ICD-10-CM | POA: Insufficient documentation

## 2020-07-19 DIAGNOSIS — F419 Anxiety disorder, unspecified: Secondary | ICD-10-CM | POA: Diagnosis not present

## 2020-07-19 DIAGNOSIS — I2782 Chronic pulmonary embolism: Secondary | ICD-10-CM | POA: Diagnosis not present

## 2020-07-19 DIAGNOSIS — E785 Hyperlipidemia, unspecified: Secondary | ICD-10-CM | POA: Insufficient documentation

## 2020-07-19 DIAGNOSIS — I509 Heart failure, unspecified: Secondary | ICD-10-CM | POA: Insufficient documentation

## 2020-07-19 DIAGNOSIS — M797 Fibromyalgia: Secondary | ICD-10-CM | POA: Insufficient documentation

## 2020-07-19 DIAGNOSIS — G4733 Obstructive sleep apnea (adult) (pediatric): Secondary | ICD-10-CM | POA: Insufficient documentation

## 2020-07-19 DIAGNOSIS — Z79899 Other long term (current) drug therapy: Secondary | ICD-10-CM | POA: Diagnosis not present

## 2020-07-19 DIAGNOSIS — I110 Hypertensive heart disease with heart failure: Secondary | ICD-10-CM | POA: Diagnosis not present

## 2020-07-19 DIAGNOSIS — G894 Chronic pain syndrome: Secondary | ICD-10-CM | POA: Diagnosis not present

## 2020-07-19 DIAGNOSIS — I272 Pulmonary hypertension, unspecified: Secondary | ICD-10-CM

## 2020-07-19 DIAGNOSIS — Z7901 Long term (current) use of anticoagulants: Secondary | ICD-10-CM | POA: Diagnosis not present

## 2020-07-19 DIAGNOSIS — Z9989 Dependence on other enabling machines and devices: Secondary | ICD-10-CM

## 2020-07-19 DIAGNOSIS — I2724 Chronic thromboembolic pulmonary hypertension: Secondary | ICD-10-CM | POA: Diagnosis not present

## 2020-07-19 DIAGNOSIS — Z6841 Body Mass Index (BMI) 40.0 and over, adult: Secondary | ICD-10-CM | POA: Diagnosis not present

## 2020-07-19 MED ORDER — SPIRONOLACTONE 25 MG PO TABS: 12.5000 mg | ORAL_TABLET | Freq: Every day | ORAL | 6 refills | Status: DC

## 2020-07-19 MED ORDER — RIVAROXABAN 20 MG PO TABS: ORAL_TABLET | ORAL | 6 refills | Status: DC

## 2020-07-19 NOTE — Addendum Note (Signed)
Encounter addended by: Stanford Scotland, RN on: 07/19/2020 4:44 PM  Actions taken: Order Reconciliation Section accessed, Clinical Note Signed

## 2020-07-19 NOTE — H&P (View-Only) (Signed)
Pulmonary Hypertension Clinic Note  Date:  07/19/2020   ID:  Shyenne, Maggard 1961/02/22, MRN [id number]  PCP:  Ruthine Dose, MD  HF MD: Dr Haroldine Laws    HPI: [name] [name] is a 59 y.o. 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   In [place] in 09/2015 diagnosed with saddle PE and started on anticoagulation. She was very briefly on warfarin but quickly switched to Xarelto.   She was referred to [facility] in 12/2015. Echocardiogram (12-2015) showed normal LVEF 55-60%, normal LA, normal RV size, RVSP estimated as 70 mm Hg. She further was referred to [facility] and saw Dr. Karena Addison on 03/14/2016 where patient had several studies including V/Q scan which found high prob for PE; studies c/w CTEPH. It was felt that her chronic chest pain was caused by chronic PE. RHC/LHC/Pulm Angiogram 03/2016 confirmed diagnosis of CTEPH. She underwent pulmonary thromboembolectomy 03/31/2016 at [facility] with IVC filter placement. Pre-op cath with no CAD.   Seen by Dr. Karena Addison in 04/2016 for follow up. He felt like her heart size was decreasing. Plan was for lifelong [place] and ECHO and V/Q in 4 months.  CT angio at [facility] on 05/15/16 showed " persistent but partially recanalized PE within right lower lobe pulmonary artery. No new PE identified. "  She was admitted to [facility] in 06/2016 for continued chest pain and parasthesias. V/Q scan on 06/27/16 showed prominent ventilation perfusion mismatch in right c/w chronic PE. Head CT negative. She was continued on Xarelto.  Admitted to [place] [facility] for suicidal ideation. She was then sent to [facility] for additional treatment.   Had repeat RHC in 10/20 which showed mild PAH (50/16/28) with high output (8.1l/min) and normal PVR (2.0 WU). Not candidate for additional selective pulmonary  artery vasodialtors   Here for f/u having good days and bad days. Says she is much more SOB. Having trouble with ADLs. Weight unchanged. Struggling with depression. Wearing CPA.   [place] 10/20 RA = 7 RV = 50/12 PA = 50/16 (28) PCW = 11 Fick cardiac output/index = 8.1/3.4 PVR = 2.0 WU FA sat = 99% PA sat = 72%, 77%  Assessment:  1. Mild PAH with high output and normal PVR 2. Normal PCWP  Echo 06/02/19 LVEF 65% RV dilated with normal function. Septum flat. 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 PA pressures could not be estimated. 04/2017: EF 55-60%  RV normal RVSP 25mmHG  RHC 08/26/2017 RA = 6 RV = 50/7 PA = 48/13 (30) PCW = 6 Fick cardiac output/index = 7.0/2.9 PVR = 3.1 WU Ao sat = 97% PA sat = 67%, 70% SVC sat = 70% Assessment: Mild PAH (on Adempas 1mg  tid) with high cardiac output. No evidence of intracardiac shunting.   10/2016 RHC RA = 8 RV = 64/13 PA = 65/19 (35) PCW = 9 Fick cardiac output/index = 7.5/3.1 PVR = 3.5 WU Ao sat = 96% PA sat = 68%,70% SVC sat = 71% Assessment: 1. Mild residual PAH in the setting of CTEPH s/p pulmonary thrombolectomy 2. Normal cardiac output 3. Normal left-sided filling pressures  Past Medical History:  Diagnosis Date  . Anxiety   . Arthritis   . CHF exacerbation ([place]) 08/21/2017  . Depression   . Difficult intubation    See [facility] anesthesia records in [place]  . Diverticulitis   . Dyspnea    with exertion  . Family history of adverse reaction to anesthesia    " My mother stopped breathing during procedure"  . GERD (gastroesophageal reflux disease)    PMH; resolved  . Hyperlipidemia   . Hypertension   . Obesity   . Plantar fasciitis, left   . Pneumonia   . Port-A-Cath in place   . Pre-diabetes   . Pulmonary embolism  ([place])   . Pulmonary hypertension ([place])   . Sleep apnea    wears CPAP # 9  . Wears glasses     Past Surgical History:  Procedure Laterality Date  . ABDOMINAL HYSTERECTOMY    . BIOPSY  02/26/2020   Procedure: BIOPSY;  Surgeon: Ronnette Juniper, MD;  Location: [facility];  Service: Gastroenterology;;  . BREAST REDUCTION SURGERY    . CHOLECYSTECTOMY    . COLONOSCOPY W/ BIOPSIES AND POLYPECTOMY    . EMBOLECTOMY N/A    pulmonary embolectomy  . ESOPHAGOGASTRODUODENOSCOPY N/A 02/26/2020   Procedure: ESOPHAGOGASTRODUODENOSCOPY (EGD);  Surgeon: Ronnette Juniper, MD;  Location: [facility];  Service: Gastroenterology;  Laterality: N/A;  . FRACTURE SURGERY     right ankle  . GASTROCNEMIUS RECESSION Left 02/12/2019   Procedure: LEFT GASTROCNEMIUS RECESSION, PLANTAR FASCIA RELEASE;  Surgeon: Newt Minion, MD;  Location: [facility];  Service: Orthopedics;  Laterality: Left;  . IVC FILTER INSERTION    . REDUCTION MAMMAPLASTY Bilateral   . RIGHT HEART CATH N/A 10/28/2016   Procedure: Right Heart Cath;  Surgeon: Jolaine Artist, MD;  Location: [facility];  Service: Cardiovascular;  Laterality: N/A;  . RIGHT HEART CATH N/A 08/26/2017   Procedure: RIGHT HEART CATH;  Surgeon: Jolaine Artist, MD;  Location: [facility];  Service: Cardiovascular;  Laterality: N/A;  . RIGHT HEART CATH N/A 07/05/2019   Procedure: RIGHT HEART CATH;  Surgeon: Jolaine Artist, MD;  Location: [facility];  Service: Cardiovascular;  Laterality: N/A;  . ULTRASOUND GUIDANCE FOR VASCULAR ACCESS  08/26/2017   Procedure: Ultrasound Guidance For Vascular Access;  Surgeon: Jolaine Artist, MD;  Location: [facility];  Service: Cardiovascular;;  . WISDOM TOOTH EXTRACTION      Current Medications:  Current Outpatient Medications on File Prior to Encounter  Medication Sig Dispense Refill  . acetaminophen-codeine (TYLENOL #4) 300-60 MG tablet Take 1 tablet by mouth every 4 (four) hours as needed for  pain.    Marland Kitchen albuterol (VENTOLIN HFA) 108 (90 Base) MCG/ACT inhaler Inhale 2 puffs into the lungs every 6 (six) hours as needed for wheezing or shortness of breath.    Marland Kitchen atorvastatin (LIPITOR) 20 MG tablet Take 1 tablet (20 mg total) by mouth daily at 6 PM. 90 tablet 1  . cyclobenzaprine (FLEXERIL) 10 MG tablet Take 10 mg by mouth 3 (three) times daily as needed for muscle spasms.    . diclofenac sodium (VOLTAREN) 1 % GEL Apply 2 g topically 4 (four) times daily as needed (knee and back pain).    Marland Kitchen dicyclomine (BENTYL) 10 MG capsule Take 10 mg by mouth 3 (three) times daily as needed. Stomach cramps    . docusate sodium (COLACE) 100 MG capsule Take 1 capsule (100 mg total) by mouth every 12 (twelve) hours. [place]  capsule 0  . fluticasone (FLONASE) 50 MCG/ACT nasal spray Place 2 sprays into both nostrils daily. (Patient taking differently: Place 2 sprays into both nostrils daily as needed for allergies. ) 16 g 6  . furosemide (LASIX) 40 MG tablet Take 1 tablet (40 mg total) by mouth daily. Take extra 40 mg tablet once in the afternoon AS NEEDED for weight gain 3 lbs or more. 180 tablet 1  . gabapentin (NEURONTIN) 300 MG capsule Take 600 mg by mouth 3 (three) times daily.    Marland Kitchen LORazepam (ATIVAN) 0.5 MG tablet Take 0.5 mg by mouth every 8 (eight) hours as needed for anxiety or sleep.    . meclizine (ANTIVERT) 25 MG tablet Take 25 mg by mouth 2 (two) times daily as needed for dizziness.    . Melatonin 5 MG TABS Take 5 mg by mouth at bedtime.    . methocarbamol (ROBAXIN) 500 MG tablet Take 1 tablet (500 mg total) by mouth 2 (two) times daily as needed. (Patient not taking: Reported on 02/25/2020) 20 tablet 0  . mirtazapine (REMERON) 15 MG tablet Take 15 mg by mouth at bedtime. Total dosage is 60 mg    . mirtazapine (REMERON) 45 MG tablet Take 45 mg by mouth at bedtime. Total dosage is 60 mg    . montelukast (SINGULAIR) 10 MG tablet Take 10 mg by mouth every evening.    Marland Kitchen omeprazole (PRILOSEC) 40 MG capsule Take  40 mg by mouth 2 (two) times daily.    . polyethylene glycol powder (GLYCOLAX/MIRALAX) powder Take 17 g by mouth 2 (two) times daily as needed. (Patient taking differently: Take 17 g by mouth 2 (two) times daily as needed for mild constipation. ) 3350 g 1  . Riociguat (ADEMPAS) 2 MG TABS Take 2 mg by mouth 3 (three) times daily. Need office appointment for future refills 90 tablet 1  . rivaroxaban (XARELTO) 20 MG TABS tablet TAKE 1 TABLET BY MOUTH ONCE DAILY WITH SUPPER 30 tablet 1  . spironolactone (ALDACTONE) 25 MG tablet Take 12.5 mg by mouth every other day.    . traMADol (ULTRAM) 50 MG tablet Take 1-2 tabs po bid prn pain (Patient not taking: Reported on 02/25/2020) 30 tablet 1  . traZODone (DESYREL) 150 MG tablet Take 150 mg by mouth at bedtime as needed for sleep.     No current facility-administered medications on file prior to encounter.    Allergies:   [name].   Social History   Socioeconomic History  . Marital status: Married    Spouse name: Not on file  . Number of children: Not on file  . Years of education: Not on file  . Highest education level: Not on file  Occupational History  . Not on file  Tobacco Use  . Smoking status: Never Smoker  . Smokeless tobacco: Never Used  Vaping Use  . Vaping Use: Never used  Substance and Sexual Activity  . Alcohol use: No  . Drug use: No  . Sexual activity: Yes    Partners: Male    Birth control/protection: Surgical  Other Topics Concern  . Not on file  Social History Narrative  . Not on file   Social Determinants of Health   Financial Resource Strain:   . Difficulty of Paying Living Expenses: Not on file  Food Insecurity:   . Worried About Charity fundraiser in the Last Year: Not on file  . Ran Out of Food in the Last  Year: Not on file  Transportation Needs:   . Lack of Transportation (Medical): Not on file  . Lack of Transportation (Non-Medical): Not on file  Physical Activity:   . Days of  Exercise per Week: Not on file  . Minutes of Exercise per Session: Not on file  Stress:   . Feeling of Stress : Not on file  Social Connections:   . Frequency of Communication with Friends and Family: Not on file  . Frequency of Social Gatherings with Friends and Family: Not on file  . Attends Religious Services: Not on file  . Active Member of Clubs or Organizations: Not on file  . Attends Archivist Meetings: Not on file  . Marital Status: Not on file    Family History:  The patient's family history includes Anxiety disorder in her cousin, sister, and sister; Bipolar disorder in her sister and sister; Dementia in her father and mother; Drug abuse in her sister and sister; Lung cancer in her maternal grandmother; Sexual abuse in her maternal aunt.     Review of systems complete and found to be negative unless listed in HPI.   PHYSICAL EXAM:    Vitals:   07/19/20 1540  BP: 124/80  Pulse: 93  SpO2: 94%  Weight: (!) 150.8 kg (332 lb 6.4 oz)    Wt Readings from Last 3 Encounters:  07/19/20 (!) 150.8 kg (332 lb 6.4 oz)  02/26/20 (!) 151 kg (333 lb)  01/17/20 (!) 152 kg (335 lb)    General:  Obese woman No resp difficulty HEENT: normal Neck: supple. no JVD. Carotids 2+ bilat; no bruits. No lymphadenopathy or thryomegaly appreciated. Cor: PMI nondisplaced. Regular rate & rhythm. No rubs, gallops or murmurs. Lungs: clear Abdomen: severely obese soft, nontender, nondistended. No hepatosplenomegaly. No bruits or masses. Good bowel sounds. Extremities: no cyanosis, clubbing, rash, edema Neuro: alert & orientedx3, cranial nerves grossly intact. moves all 4 extremities w/o difficulty. Affect pleasant    Studies/Labs Reviewed:   Recent Labs: 09/18/2019: B Natriuretic Peptide 25.7 02/28/2020: Magnesium 1.9 06/01/2020: ALT 10; BUN 10; Creatinine, Ser 0.84; Hemoglobin 11.2; Platelets 314; Potassium 3.5; Sodium 144; TSH 0.484   Lipid Panel    Component Value Date/Time   CHOL  114 06/01/2020 1349   TRIG 84 06/01/2020 1349   HDL 30 (L) 06/01/2020 1349   CHOLHDL 3.8 06/01/2020 1349   VLDL 17 06/01/2020 1349   LDLCALC 67 06/01/2020 1349    Additional studies/ records that were reviewed today include:   ECHO 8/18 EF 55-60%  RV normal RVSP 48mmHG   2D ECHO: 12/15/2015 LV EF: 55% -   60% Study Conclusions - Left ventricle: The cavity size was normal. Wall thickness was normal. Systolic function was normal. The estimated ejection  fraction was in the range of 55% to 60%. - Pulmonary arteries: PA peak pressure: 70 mm Hg (S). - Pericardium, extracardiac: A trivial pericardial effusion was   identified.   2D ECHO 04/05/16 INTERPRETATION:NORMAL LEFT VENTRICULAR FUNCTION WITH MILD LVHSEVERE RV SYSTOLIC DYSFUNCTION (See above) VALVULAR REGURGITATION: TRIVIAL PR, MILD TR NO VALVULAR STENOSIS TRIVIAL PERICARDIAL EFFUSION Compared with prior Echo study on 03/14/2016: RVSP increased from 50 to 69   CATH right heart and coronary angiography: 03/25/2016 [place] Component Name Value Ref Range  Cardiac Index (l/min/m2) 2.3 L/min/m2  Right Atrium Mean Pressure (mmHg) 11 mmHg   Right Ventricle Systolic Pressure (mmHg) 68 mmHg   Pulmonary Artery Mean Pressure (mmHg) 40 mmHg   Pulmonary Wedge  Pressure (mmHg) 14 mmHg   Pulmonary Vascular Resistance ([place] units) 4.7    CT angio 05/15/16 IMPRESSION: Persistent but partially recanalized pulmonary embolus within the right lower lobe pulmonary artery. No new pulmonary embolism is identified. Minimal bibasilar atelectatic changes stable from the prior exam.   V/Q scan 06/27/16 IMPRESSION: Prominent ventilation perfusion mismatch noted on the right. Finding suggests high probability right-sided pulmonary embolus in this patient with known right-sided pulmonary embolus.   ASSESSMENT & PLAN:    1. 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 No bleeding  - RHC 12/18 with very mild PAH and PVR 3.1 WU - Echo 10/19 shows EF 55-60% RV not well seen but looks ok No septal flattening  - Echo 9/20 LVEF 65% RV dilated with normal function. Septum flat. Personally reviewed - RHC 10/20 mild PAH (50/16/28) with high output (8.1l/min) and normal PVR (2.0 WU). Not candidate for additional selective pulmonary artery vasodilators. - Worse today NYHA III-IIIB in setting of severe obesity - Continue adempas at 2mg  tid. - Volume status looks ok. On lasix 40 daily with extra in afternoon on several days per week. Will increase spiro to 12.5 daily - Given worsening symptoms will repeat RHC next week  2. Chronic chest pain:  - Has chronic PE s/p thrombectomy, LHC performed at [facility] 7/17 with no CAD.  - Resolved  3. Fibromyalgia/Rheumatoid Arthitis:  - Follows with Rheumatology. No change.    4. Morbid obesity with OHS:  - Body mass index is 58.88 kg/m.  - Encouraged weight loss with low-carb diet. No longer at [place]  5. OSA - Encouraged nightly CPAP use - Follows with Pulmonary  6. Depression/anxiety - Receiving outpatient therapy.   - Per PCP    Glori Bickers, MD  3:18 PM

## 2020-07-19 NOTE — Patient Instructions (Addendum)
Increase Spirolactone to 12.5 mg daily  Please call office in 6 months to schedule an appointment  If you have any questions or concerns before your next appointment please send [place] a message through [place] or call our office at [phone].    TO LEAVE A MESSAGE FOR THE NURSE SELECT OPTION 2, PLEASE LEAVE A MESSAGE INCLUDING: . YOUR NAME . DATE OF BIRTH . CALL BACK NUMBER . REASON FOR CALL**this is important as we prioritize the call backs  [place] AS LONG AS YOU CALL BEFORE 4:00 PM  At the [facility], you and your health needs are our priority. As part of our continuing mission to provide you with exceptional heart care, we have created designated Provider Care Teams. These Care Teams include your primary Cardiologist (physician) and Advanced Practice Providers (APPs- Physician Assistants and Nurse Practitioners) who all work together to provide you with the care you need, when you need it.   You may see any of the following providers on your designated Care Team at your next follow up: Marland Kitchen Dr Glori Bickers . Dr Loralie Champagne . Darrick Grinder, NP . Lyda Jester, PA . Audry Riles, PharmD   Please be sure to bring in all your medications bottles to every appointment.        Catheterization instructions  You are scheduled for a Cardiac Catheterization on Monday, November 15 with Dr. Glori Bickers.  1. Please arrive at the [facility] (Main Entrance A) at [facility]: [address] [place], [place] [address] at 8:30 AM (This time is two hours before your procedure to ensure your preparation). Free valet parking service is available.   Special note: Every effort is made to have your procedure done on time. Please understand that emergencies sometimes delay scheduled procedures.  2. Diet: Do not eat solid foods after midnight.  The patient may have clear liquids until 5am upon the day of the procedure.  3.Covid  Test Friday November 12,2021 12:20 PM   pleas go to  [place]  4. Medication instructions in preparation for your procedure:   Do Not take Xarelto dose Sunday night. On day of procedure hold your Lasix dose.  On the morning of your procedure ,t ake any morning medicines NOT listed above.  You may use sips of water.  5. Plan for one night stay--bring personal belongings. 6. Bring a current list of your medications and current insurance cards. 7. You MUST have a responsible person to drive you home. 8. Someone MUST be with you the first 24 hours after you arrive home or your discharge will be delayed. 9. Please wear clothes that are easy to get on and off and wear slip-on shoes.  Thank you for allowing [place] to care for you!   -- [place] [facility]

## 2020-07-19 NOTE — Addendum Note (Signed)
Encounter addended by: Stanford Scotland, RN on: 07/19/2020 4:19 PM  Actions taken: Visit diagnoses modified, Diagnosis association updated, Pharmacy for encounter modified, Order list changed

## 2020-07-19 NOTE — Progress Notes (Signed)
Pulmonary Hypertension Clinic Note  Date:  07/19/2020   ID:  [name] [name], [name] [name] [birth year], MRN [id number]  PCP:  Ruthine Dose, MD  HF MD: Dr Haroldine Laws    HPI: [name] [name] is a 59 y.o. 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   In [place] in 09/2015 diagnosed with saddle PE and started on anticoagulation. She was very briefly on warfarin but quickly switched to Xarelto.   She was referred to [facility] in 12/2015. Echocardiogram (12-2015) showed normal LVEF 55-60%, normal LA, normal RV size, RVSP estimated as 70 mm Hg. She further was referred to [facility] and saw Dr. Karena Addison on 03/14/2016 where patient had several studies including V/Q scan which found high prob for PE; studies c/w CTEPH. It was felt that her chronic chest pain was caused by chronic PE. RHC/LHC/Pulm Angiogram 03/2016 confirmed diagnosis of CTEPH. She underwent pulmonary thromboembolectomy 03/31/2016 at [facility] with IVC filter placement. Pre-op cath with no CAD.   Seen by Dr. Karena Addison in 04/2016 for follow up. He felt like her heart size was decreasing. Plan was for lifelong [place] and ECHO and V/Q in 4 months.  CT angio at [facility] on 05/15/16 showed " persistent but partially recanalized PE within right lower lobe pulmonary artery. No new PE identified. "  She was admitted to [facility] in 06/2016 for continued chest pain and parasthesias. V/Q scan on 06/27/16 showed prominent ventilation perfusion mismatch in right c/w chronic PE. Head CT negative. She was continued on Xarelto.  Admitted to [place] for suicidal ideation. She was then sent to [facility] for additional treatment.   Had repeat RHC in 10/20 which showed mild PAH (50/16/28) with high output (8.1l/min) and normal PVR (2.0 WU). Not candidate for additional selective pulmonary  artery vasodialtors   Here for f/u having good days and bad days. Says she is much more SOB. Having trouble with ADLs. Weight unchanged. Struggling with depression. Wearing CPA.   [place] 10/20 RA = 7 RV = 50/12 PA = 50/16 (28) PCW = 11 Fick cardiac output/index = 8.1/3.4 PVR = 2.0 WU FA sat = 99% PA sat = 72%, 77%  Assessment:  1. Mild PAH with high output and normal PVR 2. Normal PCWP  Echo 06/02/19 LVEF 65% RV dilated with normal function. Septum flat. 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 PA pressures could not be estimated. 04/2017: EF 55-60%  RV normal RVSP 70mmHG  RHC 08/26/2017 RA = 6 RV = 50/7 PA = 48/13 (30) PCW = 6 Fick cardiac output/index = 7.0/2.9 PVR = 3.1 WU Ao sat = 97% PA sat = 67%, 70% SVC sat = 70% Assessment: Mild PAH (on Adempas 1mg  tid) with high cardiac output. No evidence of intracardiac shunting.   10/2016 RHC RA = 8 RV = 64/13 PA = 65/19 (35) PCW = 9 Fick cardiac output/index = 7.5/3.1 PVR = 3.5 WU Ao sat = 96% PA sat = 68%,70% SVC sat = 71% Assessment: 1. Mild residual PAH in the setting of CTEPH s/p pulmonary thrombolectomy 2. Normal cardiac output 3. Normal left-sided filling pressures  Past Medical History:  Diagnosis Date   Anxiety    Arthritis    CHF exacerbation ([place]) 08/21/2017   Depression    Difficult intubation    See [facility] anesthesia records in Care Everywhere   Diverticulitis    Dyspnea    with exertion   Family history of adverse reaction to anesthesia    " My mother stopped breathing during procedure"   GERD (gastroesophageal reflux disease)    PMH; resolved   Hyperlipidemia    Hypertension    Obesity    Plantar fasciitis, left    Pneumonia    Port-A-Cath in place    Pre-diabetes    Pulmonary embolism  ([place])    Pulmonary hypertension ([place])    Sleep apnea    wears CPAP # 9   Wears glasses     Past Surgical History:  Procedure Laterality Date   ABDOMINAL HYSTERECTOMY     BIOPSY  02/26/2020   Procedure: BIOPSY;  Surgeon: Ronnette Juniper, MD;  Location: [facility] ENDOSCOPY;  Service: Gastroenterology;;   BREAST REDUCTION SURGERY     CHOLECYSTECTOMY     COLONOSCOPY W/ BIOPSIES AND POLYPECTOMY     EMBOLECTOMY N/A    pulmonary embolectomy   ESOPHAGOGASTRODUODENOSCOPY N/A 02/26/2020   Procedure: ESOPHAGOGASTRODUODENOSCOPY (EGD);  Surgeon: Ronnette Juniper, MD;  Location: [place];  Service: Gastroenterology;  Laterality: N/A;   FRACTURE SURGERY     right ankle   GASTROCNEMIUS RECESSION Left 02/12/2019   Procedure: LEFT GASTROCNEMIUS RECESSION, PLANTAR FASCIA RELEASE;  Surgeon: Newt Minion, MD;  Location: [place];  Service: Orthopedics;  Laterality: Left;   IVC FILTER INSERTION     REDUCTION MAMMAPLASTY Bilateral    RIGHT HEART CATH N/A 10/28/2016   Procedure: Right Heart Cath;  Surgeon: Jolaine Artist, MD;  Location: [place] CV LAB;  Service: Cardiovascular;  Laterality: N/A;   RIGHT HEART CATH N/A 08/26/2017   Procedure: RIGHT HEART CATH;  Surgeon: Jolaine Artist, MD;  Location: [place] CV LAB;  Service: Cardiovascular;  Laterality: N/A;   RIGHT HEART CATH N/A 07/05/2019   Procedure: RIGHT HEART CATH;  Surgeon: Jolaine Artist, MD;  Location: [place] CV LAB;  Service: Cardiovascular;  Laterality: N/A;   ULTRASOUND GUIDANCE FOR VASCULAR ACCESS  08/26/2017   Procedure: Ultrasound Guidance For Vascular Access;  Surgeon: Jolaine Artist, MD;  Location: [place] CV LAB;  Service: Cardiovascular;;   WISDOM TOOTH EXTRACTION      Current Medications:  Current Outpatient Medications on File Prior to Encounter  Medication Sig Dispense Refill   acetaminophen-codeine (TYLENOL #4) 300-60 MG tablet Take 1 tablet by mouth every 4 (four) hours as needed for  pain.     albuterol (VENTOLIN HFA) 108 (90 Base) MCG/ACT inhaler Inhale 2 puffs into the lungs every 6 (six) hours as needed for wheezing or shortness of breath.     atorvastatin (LIPITOR) 20 MG tablet Take 1 tablet (20 mg total) by mouth daily at 6 PM. 90 tablet 1   cyclobenzaprine (FLEXERIL) 10 MG tablet Take 10 mg by mouth 3 (three) times daily as needed for muscle spasms.     diclofenac sodium (VOLTAREN) 1 % GEL Apply 2 g topically 4 (four) times daily as needed (knee and back pain).     dicyclomine (BENTYL) 10 MG capsule Take 10 mg by mouth 3 (three) times daily as needed. Stomach cramps     docusate sodium (COLACE) 100 MG capsule Take 1 capsule (100 mg total) by mouth every 12 (twelve) hours. [place]  capsule 0   fluticasone (FLONASE) 50 MCG/ACT nasal spray Place 2 sprays into both nostrils daily. (Patient taking differently: Place 2 sprays into both nostrils daily as needed for allergies. ) 16 g 6   furosemide (LASIX) 40 MG tablet Take 1 tablet (40 mg total) by mouth daily. Take extra 40 mg tablet once in the afternoon AS NEEDED for weight gain 3 lbs or more. 180 tablet 1   gabapentin (NEURONTIN) 300 MG capsule Take 600 mg by mouth 3 (three) times daily.     LORazepam (ATIVAN) 0.5 MG tablet Take 0.5 mg by mouth every 8 (eight) hours as needed for anxiety or sleep.     meclizine (ANTIVERT) 25 MG tablet Take 25 mg by mouth 2 (two) times daily as needed for dizziness.     Melatonin 5 MG TABS Take 5 mg by mouth at bedtime.     methocarbamol (ROBAXIN) 500 MG tablet Take 1 tablet (500 mg total) by mouth 2 (two) times daily as needed. (Patient not taking: Reported on 02/25/2020) 20 tablet 0   mirtazapine (REMERON) 15 MG tablet Take 15 mg by mouth at bedtime. Total dosage is 60 mg     mirtazapine (REMERON) 45 MG tablet Take 45 mg by mouth at bedtime. Total dosage is 60 mg     montelukast (SINGULAIR) 10 MG tablet Take 10 mg by mouth every evening.     omeprazole (PRILOSEC) 40 MG capsule Take  40 mg by mouth 2 (two) times daily.     polyethylene glycol powder (GLYCOLAX/MIRALAX) powder Take 17 g by mouth 2 (two) times daily as needed. (Patient taking differently: Take 17 g by mouth 2 (two) times daily as needed for mild constipation. ) 3350 g 1   Riociguat (ADEMPAS) 2 MG TABS Take 2 mg by mouth 3 (three) times daily. Need office appointment for future refills 90 tablet 1   rivaroxaban (XARELTO) 20 MG TABS tablet TAKE 1 TABLET BY MOUTH ONCE DAILY WITH SUPPER 30 tablet 1   spironolactone (ALDACTONE) 25 MG tablet Take 12.5 mg by mouth every other day.     traMADol (ULTRAM) 50 MG tablet Take 1-2 tabs po bid prn pain (Patient not taking: Reported on 02/25/2020) 30 tablet 1   traZODone (DESYREL) 150 MG tablet Take 150 mg by mouth at bedtime as needed for sleep.     No current facility-administered medications on file prior to encounter.    Allergies:   [name].   Social History   Socioeconomic History   Marital status: Married    Spouse name: Not on file   Number of children: Not on file   Years of education: Not on file   Highest education level: Not on file  Occupational History   Not on file  Tobacco Use   Smoking status: Never Smoker   Smokeless tobacco: Never Used  Vaping Use   Vaping Use: Never used  Substance and Sexual Activity   Alcohol use: No   Drug use: No   Sexual activity: Yes    Partners: Male    Birth control/protection: Surgical  Other Topics Concern   Not on file  Social History Narrative   Not on file   Social Determinants of Health   Financial Resource Strain:    Difficulty of Paying Living Expenses: Not on file  Food Insecurity:    Worried About [place] in the Last Year: Not on file   Ran Out of Food in the Last  Year: Not on file  Transportation Needs:    Lack of Transportation (Medical): Not on file   Lack of Transportation (Non-Medical): Not on file  Physical Activity:    Days of  Exercise per Week: Not on file   Minutes of Exercise per Session: Not on file  Stress:    Feeling of Stress : Not on file  Social Connections:    Frequency of Communication with Friends and Family: Not on file   Frequency of Social Gatherings with Friends and Family: Not on file   Attends Religious Services: Not on file   Active Member of Clubs or Organizations: Not on file   Attends Archivist Meetings: Not on file   Marital Status: Not on file    Family History:  The patient's family history includes Anxiety disorder in her cousin, sister, and sister; Bipolar disorder in her sister and sister; Dementia in her father and mother; Drug abuse in her sister and sister; Lung cancer in her maternal grandmother; Sexual abuse in her maternal aunt.     Review of systems complete and found to be negative unless listed in HPI.   PHYSICAL EXAM:    Vitals:   07/19/20 1540  BP: 124/80  Pulse: 93  SpO2: 94%  Weight: (!) 150.8 kg (332 lb 6.4 oz)    Wt Readings from Last 3 Encounters:  07/19/20 (!) 150.8 kg (332 lb 6.4 oz)  02/26/20 (!) 151 kg (333 lb)  01/17/20 (!) 152 kg (335 lb)    General:  Obese woman No resp difficulty HEENT: normal Neck: supple. no JVD. Carotids 2+ bilat; no bruits. No lymphadenopathy or thryomegaly appreciated. Cor: PMI nondisplaced. Regular rate & rhythm. No rubs, gallops or murmurs. Lungs: clear Abdomen: severely obese soft, nontender, nondistended. No hepatosplenomegaly. No bruits or masses. Good bowel sounds. Extremities: no cyanosis, clubbing, rash, edema Neuro: alert & orientedx3, cranial nerves grossly intact. moves all 4 extremities w/o difficulty. Affect pleasant    Studies/Labs Reviewed:   Recent Labs: 09/18/2019: B Natriuretic Peptide 25.7 02/28/2020: Magnesium 1.9 06/01/2020: ALT 10; BUN 10; Creatinine, Ser 0.84; Hemoglobin 11.2; Platelets 314; Potassium 3.5; Sodium 144; TSH 0.484   Lipid Panel    Component Value Date/Time   CHOL  114 06/01/2020 1349   TRIG 84 06/01/2020 1349   HDL 30 (L) 06/01/2020 1349   CHOLHDL 3.8 06/01/2020 1349   VLDL 17 06/01/2020 1349   LDLCALC 67 06/01/2020 1349    Additional studies/ records that were reviewed today include:   ECHO 8/18 EF 55-60%  RV normal RVSP 65mmHG   2D ECHO: 12/15/2015 LV EF: 55% -   60% Study Conclusions - Left ventricle: The cavity size was normal. Wall thickness was normal. Systolic function was normal. The estimated ejection  fraction was in the range of 55% to 60%. - Pulmonary arteries: PA peak pressure: 70 mm Hg (S). - Pericardium, extracardiac: A trivial pericardial effusion was   identified.   2D ECHO 04/05/16 INTERPRETATION:NORMAL LEFT VENTRICULAR FUNCTION WITH MILD LVHSEVERE RV SYSTOLIC DYSFUNCTION (See above) VALVULAR REGURGITATION: TRIVIAL PR, MILD TR NO VALVULAR STENOSIS TRIVIAL PERICARDIAL EFFUSION Compared with prior Echo study on 03/14/2016: RVSP increased from 50 to 69   CATH right heart and coronary angiography: 03/25/2016 [place] Component Name Value Ref Range  Cardiac Index (l/min/m2) 2.3 L/min/m2  Right Atrium Mean Pressure (mmHg) 11 mmHg   Right Ventricle Systolic Pressure (mmHg) 68 mmHg   Pulmonary Artery Mean Pressure (mmHg) 40 mmHg   Pulmonary Wedge  Pressure (mmHg) 14 mmHg   Pulmonary Vascular Resistance ([place] units) 4.7    CT angio 05/15/16 IMPRESSION: Persistent but partially recanalized pulmonary embolus within the right lower lobe pulmonary artery. No new pulmonary embolism is identified. Minimal bibasilar atelectatic changes stable from the prior exam.   V/Q scan 06/27/16 IMPRESSION: Prominent ventilation perfusion mismatch noted on the right. Finding suggests high probability right-sided pulmonary embolus in this patient with known right-sided pulmonary embolus.   ASSESSMENT & PLAN:    1. 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 No bleeding  - RHC 12/18 with very mild PAH and PVR 3.1 WU - Echo 10/19 shows EF 55-60% RV not well seen but looks ok No septal flattening  - Echo 9/20 LVEF 65% RV dilated with normal function. Septum flat. Personally reviewed - RHC 10/20 mild PAH (50/16/28) with high output (8.1l/min) and normal PVR (2.0 WU). Not candidate for additional selective pulmonary artery vasodilators. - Worse today NYHA III-IIIB in setting of severe obesity - Continue adempas at 2mg  tid. - Volume status looks ok. On lasix 40 daily with extra in afternoon on several days per week. Will increase spiro to 12.5 daily - Given worsening symptoms will repeat RHC next week  2. Chronic chest pain:  - Has chronic PE s/p thrombectomy, LHC performed at [facility] 7/17 with no CAD.  - Resolved  3. Fibromyalgia/Rheumatoid Arthitis:  - Follows with Rheumatology. No change.    4. Morbid obesity with OHS:  - Body mass index is 58.88 kg/m.  - Encouraged weight loss with low-carb diet. No longer at [place]  5. OSA - Encouraged nightly CPAP use - Follows with Pulmonary  6. Depression/anxiety - Receiving outpatient therapy.   - Per PCP    Glori Bickers, MD  3:18 PM

## 2020-07-20 ENCOUNTER — Ambulatory Visit
Admission: RE | Admit: 2020-07-20 | Discharge: 2020-07-20 | Disposition: A | Discharge status: Home / Self Care | Payer: Medicare HMO | Source: Ambulatory Visit | Attending: General Practice | Admitting: General Practice

## 2020-07-20 DIAGNOSIS — Z1231 Encounter for screening mammogram for malignant neoplasm of breast: Secondary | ICD-10-CM

## 2020-07-21 ENCOUNTER — Other Ambulatory Visit (HOSPITAL_COMMUNITY)
Admission: RE | Admit: 2020-07-21 | Discharge: 2020-07-21 | Disposition: A | Discharge status: Home / Self Care | Payer: Medicare HMO | Source: Ambulatory Visit | Attending: Internal Medicine | Admitting: Internal Medicine

## 2020-07-21 DIAGNOSIS — Z20822 Contact with and (suspected) exposure to covid-19: Secondary | ICD-10-CM | POA: Insufficient documentation

## 2020-07-21 DIAGNOSIS — Z01812 Encounter for preprocedural laboratory examination: Secondary | ICD-10-CM | POA: Insufficient documentation

## 2020-07-21 LAB — SARS CORONAVIRUS 2 (TAT 6-24 HRS): SARS Coronavirus 2: NEGATIVE

## 2020-07-24 ENCOUNTER — Ambulatory Visit (HOSPITAL_COMMUNITY)
Admission: RE | Admit: 2020-07-24 | Discharge: 2020-07-24 | Disposition: A | Discharge status: Home / Self Care | Payer: Medicare HMO | Attending: Internal Medicine | Admitting: Internal Medicine

## 2020-07-24 ENCOUNTER — Encounter (HOSPITAL_COMMUNITY): Payer: Self-pay | Admitting: Internal Medicine

## 2020-07-24 ENCOUNTER — Other Ambulatory Visit: Payer: Self-pay

## 2020-07-24 ENCOUNTER — Encounter (HOSPITAL_COMMUNITY)
Admission: RE | Disposition: A | Discharge status: Home / Self Care | Payer: Self-pay | Source: Home / Self Care | Attending: Internal Medicine

## 2020-07-24 DIAGNOSIS — F329 Major depressive disorder, single episode, unspecified: Secondary | ICD-10-CM | POA: Diagnosis not present

## 2020-07-24 DIAGNOSIS — M069 Rheumatoid arthritis, unspecified: Secondary | ICD-10-CM | POA: Insufficient documentation

## 2020-07-24 DIAGNOSIS — F419 Anxiety disorder, unspecified: Secondary | ICD-10-CM | POA: Insufficient documentation

## 2020-07-24 DIAGNOSIS — G4733 Obstructive sleep apnea (adult) (pediatric): Secondary | ICD-10-CM | POA: Insufficient documentation

## 2020-07-24 DIAGNOSIS — I2721 Secondary pulmonary arterial hypertension: Secondary | ICD-10-CM | POA: Diagnosis not present

## 2020-07-24 DIAGNOSIS — M797 Fibromyalgia: Secondary | ICD-10-CM | POA: Diagnosis not present

## 2020-07-24 DIAGNOSIS — Z79899 Other long term (current) drug therapy: Secondary | ICD-10-CM | POA: Insufficient documentation

## 2020-07-24 DIAGNOSIS — R079 Chest pain, unspecified: Secondary | ICD-10-CM | POA: Insufficient documentation

## 2020-07-24 DIAGNOSIS — G894 Chronic pain syndrome: Secondary | ICD-10-CM | POA: Insufficient documentation

## 2020-07-24 DIAGNOSIS — Z6841 Body Mass Index (BMI) 40.0 and over, adult: Secondary | ICD-10-CM | POA: Insufficient documentation

## 2020-07-24 DIAGNOSIS — I272 Pulmonary hypertension, unspecified: Secondary | ICD-10-CM

## 2020-07-24 DIAGNOSIS — Z7901 Long term (current) use of anticoagulants: Secondary | ICD-10-CM | POA: Diagnosis not present

## 2020-07-24 HISTORY — PX: RIGHT HEART CATH: CATH118263

## 2020-07-24 LAB — POCT I-STAT EG7
Acid-Base Excess: 3 mmol/L — ABNORMAL HIGH (ref 0.0–2.0)
Acid-Base Excess: 4 mmol/L — ABNORMAL HIGH (ref 0.0–2.0)
Bicarbonate: 28.5 mmol/L — ABNORMAL HIGH (ref 20.0–28.0)
Bicarbonate: 29.6 mmol/L — ABNORMAL HIGH (ref 20.0–28.0)
Calcium, Ion: 1.22 mmol/L (ref 1.15–1.40)
Calcium, Ion: 1.28 mmol/L (ref 1.15–1.40)
HCT: 38 % (ref 36.0–46.0)
HCT: 39 % (ref 36.0–46.0)
Hemoglobin: 12.9 g/dL (ref 12.0–15.0)
Hemoglobin: 13.3 g/dL (ref 12.0–15.0)
O2 Saturation: 67 %
O2 Saturation: 68 %
Potassium: 3.7 mmol/L (ref 3.5–5.1)
Potassium: 3.9 mmol/L (ref 3.5–5.1)
Sodium: 145 mmol/L (ref 135–145)
Sodium: 146 mmol/L — ABNORMAL HIGH (ref 135–145)
TCO2: 30 mmol/L (ref 22–32)
TCO2: 31 mmol/L (ref 22–32)
pCO2, Ven: 46.6 mmHg (ref 44.0–60.0)
pCO2, Ven: 47.1 mmHg (ref 44.0–60.0)
pH, Ven: 7.395 (ref 7.250–7.430)
pH, Ven: 7.406 (ref 7.250–7.430)
pO2, Ven: 35 mmHg (ref 32.0–45.0)
pO2, Ven: 36 mmHg (ref 32.0–45.0)

## 2020-07-24 LAB — BASIC METABOLIC PANEL
Anion gap: 9 (ref 5–15)
BUN: 11 mg/dL (ref 6–20)
CO2: 28 mmol/L (ref 22–32)
Calcium: 9.1 mg/dL (ref 8.9–10.3)
Chloride: 104 mmol/L (ref 98–111)
Creatinine, Ser: 1.09 mg/dL — ABNORMAL HIGH (ref 0.44–1.00)
GFR, Estimated: 59 mL/min — ABNORMAL LOW (ref >60)
Glucose, Bld: 102 mg/dL — ABNORMAL HIGH (ref 70–99)
Potassium: 3.8 mmol/L (ref 3.5–5.1)
Sodium: 141 mmol/L (ref 135–145)

## 2020-07-24 LAB — CBC
HCT: 37.8 % (ref 36.0–46.0)
Hemoglobin: 11.5 g/dL — ABNORMAL LOW (ref 12.0–15.0)
MCH: 20.7 pg — ABNORMAL LOW (ref 26.0–34.0)
MCHC: 30.4 g/dL (ref 30.0–36.0)
MCV: 68.1 fL — ABNORMAL LOW (ref 80.0–100.0)
Platelets: 311 10*3/uL (ref 150–400)
RBC: 5.55 MIL/uL — ABNORMAL HIGH (ref 3.87–5.11)
RDW: 20 % — ABNORMAL HIGH (ref 11.5–15.5)
WBC: 7.5 10*3/uL (ref 4.0–10.5)
nRBC: 0 % (ref 0.0–0.2)

## 2020-07-24 SURGERY — RIGHT HEART CATH
Anesthesia: LOCAL

## 2020-07-24 MED ORDER — LIDOCAINE HCL (PF) 1 % IJ SOLN
INTRAMUSCULAR | Status: DC | PRN
  Administered 2020-07-24: 1 mL
  Administered 2020-07-24: 2 mL

## 2020-07-24 MED ORDER — SODIUM CHLORIDE 0.9% FLUSH: 3.0000 mL | INTRAVENOUS | Status: DC | PRN

## 2020-07-24 MED ORDER — SODIUM CHLORIDE 0.9% FLUSH: 3.0000 mL | Freq: Two times a day (BID) | INTRAVENOUS | Status: DC

## 2020-07-24 MED ORDER — ONDANSETRON HCL 4 MG/2ML IJ SOLN: 4.0000 mg | Freq: Four times a day (QID) | INTRAMUSCULAR | Status: DC | PRN

## 2020-07-24 MED ORDER — LIDOCAINE HCL (PF) 1 % IJ SOLN
INTRAMUSCULAR | Status: AC
  Filled 2020-07-24: qty 30

## 2020-07-24 MED ORDER — SODIUM CHLORIDE 0.9 % IV SOLN: INTRAVENOUS | Status: DC

## 2020-07-24 MED ORDER — SODIUM CHLORIDE 0.9 % IV SOLN: 250.0000 mL | INTRAVENOUS | Status: DC | PRN

## 2020-07-24 MED ORDER — ACETAMINOPHEN 325 MG PO TABS: 650.0000 mg | ORAL_TABLET | ORAL | Status: DC | PRN

## 2020-07-24 MED ORDER — LABETALOL HCL 5 MG/ML IV SOLN: 10.0000 mg | INTRAVENOUS | Status: DC | PRN

## 2020-07-24 MED ORDER — SODIUM CHLORIDE 0.9 % IV SOLN
INTRAVENOUS | Status: AC | PRN
  Administered 2020-07-24: 10 mL/h via INTRAVENOUS

## 2020-07-24 MED ORDER — HEPARIN (PORCINE) IN NACL 1000-0.9 UT/500ML-% IV SOLN
INTRAVENOUS | Status: DC | PRN
  Administered 2020-07-24: 500 mL

## 2020-07-24 MED ORDER — ASPIRIN 81 MG PO CHEW: 81.0000 mg | CHEWABLE_TABLET | ORAL | Status: DC

## 2020-07-24 MED ORDER — HYDRALAZINE HCL 20 MG/ML IJ SOLN: 10.0000 mg | INTRAMUSCULAR | Status: DC | PRN

## 2020-07-24 MED ORDER — HEPARIN (PORCINE) IN NACL 1000-0.9 UT/500ML-% IV SOLN
INTRAVENOUS | Status: AC
  Filled 2020-07-24: qty 500

## 2020-07-24 SURGICAL SUPPLY — 6 items
CATH BALLN WEDGE 5F 110CM (CATHETERS) ×2 IMPLANT
PACK CARDIAC CATHETERIZATION (CUSTOM PROCEDURE TRAY) ×2 IMPLANT
SHEATH GLIDE SLENDER 4/5FR (SHEATH) ×4 IMPLANT
TRANSDUCER W/STOPCOCK (MISCELLANEOUS) ×2 IMPLANT
TUBING ART PRESS 72  MALE/FEM (TUBING) ×1
TUBING ART PRESS 72 MALE/FEM (TUBING) ×1 IMPLANT

## 2020-07-24 NOTE — Interval H&P Note (Signed)
History and Physical Interval Note:  07/24/2020 11:44 AM  [name] [name]  has presented today for surgery, with the diagnosis of pulmonary hypertension.  The various methods of treatment have been discussed with the patient and family. After consideration of risks, benefits and other options for treatment, the patient has consented to  Procedure(s): RIGHT HEART CATH (N/A) as a surgical intervention.  The patient's history has been reviewed, patient examined, no change in status, stable for surgery.  I have reviewed the patient's chart and labs.  Questions were answered to the patient's satisfaction.     [name] [name]

## 2020-07-24 NOTE — Research (Signed)
[place] Informed Consent   Subject Name: [name] [name] [facility]  Subject met inclusion and exclusion criteria.  The informed consent form, study requirements and expectations were reviewed with the subject and questions and concerns were addressed prior to the signing of the consent form.  The subject verbalized understanding of the trail requirements.  The subject agreed to participate in the [facility] trial and signed the informed consent.  The informed consent was obtained prior to performance of any protocol-specific procedures for the subject.  A copy of the signed informed consent was given to the subject and a copy was placed in the subject's medical record.  [name] [name]. 07/24/2020, 10:10 AM

## 2020-07-24 NOTE — Discharge Instructions (Signed)
May remove bandage tonight. Monitor for redness, swelling or odor from area.

## 2020-08-08 ENCOUNTER — Encounter (HOSPITAL_COMMUNITY): Payer: Medicare HMO

## 2020-08-10 ENCOUNTER — Ambulatory Visit (HOSPITAL_COMMUNITY)
Admission: RE | Admit: 2020-08-10 | Discharge: 2020-08-10 | Disposition: A | Discharge status: Home / Self Care | Payer: Medicare HMO | Attending: Internal Medicine | Admitting: Internal Medicine

## 2020-08-10 ENCOUNTER — Other Ambulatory Visit: Payer: Self-pay

## 2020-08-10 DIAGNOSIS — Z452 Encounter for adjustment and management of vascular access device: Secondary | ICD-10-CM | POA: Diagnosis not present

## 2020-08-10 LAB — COMPREHENSIVE METABOLIC PANEL
ALT: 11 U/L (ref 0–44)
AST: 11 U/L — ABNORMAL LOW (ref 15–41)
Albumin: 3.6 g/dL (ref 3.5–5.0)
Alkaline Phosphatase: 62 U/L (ref 38–126)
Anion gap: 10 (ref 5–15)
BUN: 14 mg/dL (ref 6–20)
CO2: 25 mmol/L (ref 22–32)
Calcium: 9.1 mg/dL (ref 8.9–10.3)
Chloride: 106 mmol/L (ref 98–111)
Creatinine, Ser: 0.95 mg/dL (ref 0.44–1.00)
GFR, Estimated: >60 mL/min (ref >60)
Glucose, Bld: 91 mg/dL (ref 70–99)
Potassium: 3.6 mmol/L (ref 3.5–5.1)
Sodium: 141 mmol/L (ref 135–145)
Total Bilirubin: 0.3 mg/dL (ref 0.3–1.2)
Total Protein: 7.2 g/dL (ref 6.5–8.1)

## 2020-08-10 LAB — CBC
HCT: 36.6 % (ref 36.0–46.0)
Hemoglobin: 11.3 g/dL — ABNORMAL LOW (ref 12.0–15.0)
MCH: 20.8 pg — ABNORMAL LOW (ref 26.0–34.0)
MCHC: 30.9 g/dL (ref 30.0–36.0)
MCV: 67.5 fL — ABNORMAL LOW (ref 80.0–100.0)
Platelets: 315 10*3/uL (ref 150–400)
RBC: 5.42 MIL/uL — ABNORMAL HIGH (ref 3.87–5.11)
RDW: 20.1 % — ABNORMAL HIGH (ref 11.5–15.5)
WBC: 6.6 10*3/uL (ref 4.0–10.5)
nRBC: 0 % (ref 0.0–0.2)

## 2020-08-10 LAB — LIPID PANEL
Cholesterol: 105 mg/dL (ref 0–200)
HDL: 31 mg/dL — ABNORMAL LOW (ref >40)
LDL Cholesterol: 63 mg/dL (ref 0–99)
Total CHOL/HDL Ratio: 3.4 RATIO
Triglycerides: 56 mg/dL (ref <150)
VLDL: 11 mg/dL (ref 0–40)

## 2020-08-10 LAB — TSH: TSH: 0.418 u[IU]/mL (ref 0.350–4.500)

## 2020-08-10 LAB — PHOSPHORUS: Phosphorus: 2.2 mg/dL — ABNORMAL LOW (ref 2.5–4.6)

## 2020-08-10 LAB — BRAIN NATRIURETIC PEPTIDE: B Natriuretic Peptide: 27.8 pg/mL (ref 0.0–100.0)

## 2020-08-10 MED ORDER — HEPARIN SOD (PORK) LOCK FLUSH 100 UNIT/ML IV SOLN
500.0000 [IU] | INTRAVENOUS | Status: AC | PRN
  Administered 2020-08-10: 500 [IU]
  Filled 2020-08-10: qty 5

## 2020-08-10 MED ORDER — SODIUM CHLORIDE 0.9% FLUSH
10.0000 mL | INTRAVENOUS | Status: AC | PRN
  Administered 2020-08-10: 10 mL

## 2020-08-10 NOTE — Progress Notes (Signed)
PATIENT CARE CENTER NOTE    Provider:Johnson, Neoma Laming, MD   Procedure:Port-a-cath flushand lab draw   Note:Port-a-cath accessed and flushed using sterile technique. Labs drawn from [place]  per Cipriano Mile, DNP orders.Patient tolerated well.PAC deaccessed.Patient refused AVS. Patient alert, oriented and ambulatory at discharge

## 2020-08-11 ENCOUNTER — Encounter (HOSPITAL_COMMUNITY): Payer: Medicare HMO

## 2020-08-11 LAB — PTH, INTACT AND CALCIUM
Calcium, Total (PTH): 9.2 mg/dL (ref 8.7–10.2)
PTH: 72 pg/mL — ABNORMAL HIGH (ref 15–65)

## 2020-09-07 ENCOUNTER — Other Ambulatory Visit (HOSPITAL_COMMUNITY): Payer: Self-pay

## 2020-09-07 MED ORDER — ADEMPAS 2 MG PO TABS: 2.0000 mg | ORAL_TABLET | Freq: Three times a day (TID) | ORAL | 1 refills | Status: DC

## 2020-09-12 ENCOUNTER — Encounter (HOSPITAL_COMMUNITY): Payer: Medicare HMO

## 2020-09-13 ENCOUNTER — Telehealth (HOSPITAL_COMMUNITY): Payer: Self-pay | Admitting: Pharmacy Technician

## 2020-09-13 NOTE — Telephone Encounter (Signed)
Advanced Heart Failure Patient Advocate Encounter  Prior Authorization for Adempas 2mg  has been approved.    Effective dates through 09/08/21  09/10/21, CPhT

## 2020-10-19 ENCOUNTER — Other Ambulatory Visit: Payer: Self-pay

## 2020-10-19 ENCOUNTER — Ambulatory Visit (HOSPITAL_COMMUNITY)
Admission: RE | Admit: 2020-10-19 | Discharge: 2020-10-19 | DRG: 950 | Disposition: A | Discharge status: Home / Self Care | Payer: Medicare HMO | Source: Ambulatory Visit | Attending: Internal Medicine | Admitting: Internal Medicine

## 2020-10-19 DIAGNOSIS — Z452 Encounter for adjustment and management of vascular access device: Secondary | ICD-10-CM

## 2020-10-19 MED ORDER — SODIUM CHLORIDE 0.9% FLUSH
10.0000 mL | INTRAVENOUS | Status: AC | PRN
  Administered 2020-10-19: 10 mL

## 2020-10-19 MED ORDER — HEPARIN SOD (PORK) LOCK FLUSH 100 UNIT/ML IV SOLN
500.0000 [IU] | INTRAVENOUS | Status: AC | PRN
  Administered 2020-10-19: 500 [IU]

## 2020-10-19 NOTE — Progress Notes (Signed)
PATIENT CARE CENTER NOTE    Provider:Johnson, Neoma Laming, MD   Procedure:Port-a-cath flush   Note:Port-a-cath accessed and flushed with Heparin and 0.9% Sodium Chloride.PAC deaccessed.Patient tolerated well. Sterile technique used throughout procedure. Patient's PCP now at [facility]. New order requested and received from [facility].  Patient refused AVS. Patient alert, oriented and ambulatory at discharge

## 2020-11-10 ENCOUNTER — Other Ambulatory Visit (HOSPITAL_COMMUNITY): Payer: Self-pay | Admitting: *Deleted

## 2020-11-10 MED ORDER — ADEMPAS 2 MG PO TABS: 2.0000 mg | ORAL_TABLET | Freq: Three times a day (TID) | ORAL | 3 refills | Status: DC

## 2020-11-13 ENCOUNTER — Other Ambulatory Visit (HOSPITAL_COMMUNITY): Payer: Self-pay | Admitting: *Deleted

## 2020-11-13 MED ORDER — FUROSEMIDE 40 MG PO TABS: 40.0000 mg | ORAL_TABLET | Freq: Every day | ORAL | 1 refills | Status: DC

## 2020-11-14 ENCOUNTER — Encounter (HOSPITAL_COMMUNITY): Payer: Medicare HMO

## 2020-11-17 ENCOUNTER — Other Ambulatory Visit: Payer: Self-pay | Admitting: General Practice

## 2020-11-17 DIAGNOSIS — R0602 Shortness of breath: Secondary | ICD-10-CM

## 2020-11-17 DIAGNOSIS — R053 Chronic cough: Secondary | ICD-10-CM

## 2020-11-28 ENCOUNTER — Non-Acute Institutional Stay (HOSPITAL_COMMUNITY)
Admission: RE | Admit: 2020-11-28 | Discharge: 2020-11-28 | Disposition: A | Discharge status: Home / Self Care | Payer: Medicare HMO | Source: Ambulatory Visit | Attending: Internal Medicine | Admitting: Internal Medicine

## 2020-11-28 ENCOUNTER — Other Ambulatory Visit: Payer: Self-pay

## 2020-11-28 DIAGNOSIS — Z452 Encounter for adjustment and management of vascular access device: Secondary | ICD-10-CM | POA: Diagnosis not present

## 2020-11-28 LAB — COMPREHENSIVE METABOLIC PANEL
ALT: 7 U/L (ref 0–44)
AST: 11 U/L — ABNORMAL LOW (ref 15–41)
Albumin: 3.6 g/dL (ref 3.5–5.0)
Alkaline Phosphatase: 73 U/L (ref 38–126)
Anion gap: 9 (ref 5–15)
BUN: 12 mg/dL (ref 6–20)
CO2: 27 mmol/L (ref 22–32)
Calcium: 9.1 mg/dL (ref 8.9–10.3)
Chloride: 107 mmol/L (ref 98–111)
Creatinine, Ser: 0.99 mg/dL (ref 0.44–1.00)
GFR, Estimated: >60 mL/min (ref >60)
Glucose, Bld: 106 mg/dL — ABNORMAL HIGH (ref 70–99)
Potassium: 3.5 mmol/L (ref 3.5–5.1)
Sodium: 143 mmol/L (ref 135–145)
Total Bilirubin: 0.5 mg/dL (ref 0.3–1.2)
Total Protein: 7.2 g/dL (ref 6.5–8.1)

## 2020-11-28 LAB — CBC WITH DIFFERENTIAL/PLATELET
Abs Immature Granulocytes: 0.03 10*3/uL (ref 0.00–0.07)
Basophils Absolute: 0 10*3/uL (ref 0.0–0.1)
Basophils Relative: 0 %
Eosinophils Absolute: 0.2 10*3/uL (ref 0.0–0.5)
Eosinophils Relative: 3 %
HCT: 36.1 % (ref 36.0–46.0)
Hemoglobin: 11.3 g/dL — ABNORMAL LOW (ref 12.0–15.0)
Immature Granulocytes: 1 %
Lymphocytes Relative: 39 %
Lymphs Abs: 2.5 10*3/uL (ref 0.7–4.0)
MCH: 21.3 pg — ABNORMAL LOW (ref 26.0–34.0)
MCHC: 31.3 g/dL (ref 30.0–36.0)
MCV: 68.1 fL — ABNORMAL LOW (ref 80.0–100.0)
Monocytes Absolute: 0.5 10*3/uL (ref 0.1–1.0)
Monocytes Relative: 7 %
Neutro Abs: 3.3 10*3/uL (ref 1.7–7.7)
Neutrophils Relative %: 50 %
Platelets: 305 10*3/uL (ref 150–400)
RBC: 5.3 MIL/uL — ABNORMAL HIGH (ref 3.87–5.11)
RDW: 20.5 % — ABNORMAL HIGH (ref 11.5–15.5)
WBC: 6.5 10*3/uL (ref 4.0–10.5)
nRBC: 0 % (ref 0.0–0.2)

## 2020-11-28 LAB — IRON AND TIBC
Iron: 35 ug/dL (ref 28–170)
Saturation Ratios: 11 % (ref 10.4–31.8)
TIBC: 322 ug/dL (ref 250–450)
UIBC: 287 ug/dL

## 2020-11-28 LAB — VITAMIN B12: Vitamin B-12: 325 pg/mL (ref 180–914)

## 2020-11-28 MED ORDER — HEPARIN SOD (PORK) LOCK FLUSH 100 UNIT/ML IV SOLN
500.0000 [IU] | INTRAVENOUS | Status: AC | PRN
  Administered 2020-11-28: 500 [IU]

## 2020-11-28 MED ORDER — SODIUM CHLORIDE 0.9% FLUSH
10.0000 mL | INTRAVENOUS | Status: AC | PRN
  Administered 2020-11-28: 10 mL

## 2020-11-28 NOTE — Progress Notes (Signed)
PATIENT CARE CENTER NOTE    Provider:Johnson, Neoma Laming, MD   Procedure:Port-a-cath flush and lab draw   Note:Port-a-cath accessedand labs drawn per order. Port flushed with Heparin and 0.9% Sodium Chloride.PAC deaccessed.Patient tolerated well. Sterile technique used throughout procedure. Patient refused AVS. Patient alert, oriented and ambulatory at discharge

## 2020-11-29 ENCOUNTER — Encounter (HOSPITAL_COMMUNITY): Payer: Medicare HMO

## 2020-12-05 ENCOUNTER — Other Ambulatory Visit: Payer: Self-pay

## 2020-12-05 ENCOUNTER — Other Ambulatory Visit (HOSPITAL_COMMUNITY): Payer: Self-pay | Admitting: *Deleted

## 2020-12-05 ENCOUNTER — Ambulatory Visit
Admission: RE | Admit: 2020-12-05 | Discharge: 2020-12-05 | Disposition: A | Discharge status: Home / Self Care | Payer: Medicare HMO | Source: Ambulatory Visit | Attending: General Practice | Admitting: General Practice

## 2020-12-05 DIAGNOSIS — R053 Chronic cough: Secondary | ICD-10-CM

## 2020-12-05 DIAGNOSIS — R0602 Shortness of breath: Secondary | ICD-10-CM

## 2020-12-06 ENCOUNTER — Other Ambulatory Visit (HOSPITAL_COMMUNITY): Payer: Self-pay | Admitting: *Deleted

## 2020-12-06 DIAGNOSIS — I272 Pulmonary hypertension, unspecified: Secondary | ICD-10-CM

## 2020-12-06 DIAGNOSIS — I2724 Chronic thromboembolic pulmonary hypertension: Secondary | ICD-10-CM

## 2020-12-06 DIAGNOSIS — I509 Heart failure, unspecified: Secondary | ICD-10-CM

## 2020-12-08 ENCOUNTER — Encounter (HOSPITAL_COMMUNITY): Payer: Self-pay | Admitting: *Deleted

## 2020-12-08 NOTE — Progress Notes (Signed)
Received referral from Dr. Haroldine Laws for this pt to participate in pulmonary rehab with the the diagnosis of Chronic Thromboembolic pulmonary Hypertension. Clinical review of pt follow up appt on 11/10 Pulmonary Hypertension clinic office note.  Pt completed right heart cath on 11/15.  Also reviewed notes in [place]. Pt with Covid Risk Score - 3. Pt appropriate for scheduling for Pulmonary rehab.  Will forward to support staff for scheduling and verification of insurance eligibility/benefits with pt consent. Cherre Huger, BSN Cardiac and Training and development officer

## 2020-12-09 ENCOUNTER — Emergency Department (HOSPITAL_COMMUNITY)
Admission: EM | Admit: 2020-12-09 | Discharge: 2020-12-10 | Disposition: A | Discharge status: Home / Self Care | Payer: Medicare HMO | Attending: Emergency Medicine | Admitting: Emergency Medicine

## 2020-12-09 ENCOUNTER — Encounter (HOSPITAL_COMMUNITY): Payer: Self-pay | Admitting: Emergency Medicine

## 2020-12-09 ENCOUNTER — Other Ambulatory Visit: Payer: Self-pay

## 2020-12-09 ENCOUNTER — Emergency Department (HOSPITAL_COMMUNITY): Payer: Medicare HMO

## 2020-12-09 DIAGNOSIS — I509 Heart failure, unspecified: Secondary | ICD-10-CM | POA: Diagnosis not present

## 2020-12-09 DIAGNOSIS — Z79899 Other long term (current) drug therapy: Secondary | ICD-10-CM | POA: Diagnosis not present

## 2020-12-09 DIAGNOSIS — I110 Hypertensive heart disease with heart failure: Secondary | ICD-10-CM | POA: Insufficient documentation

## 2020-12-09 DIAGNOSIS — R1032 Left lower quadrant pain: Secondary | ICD-10-CM | POA: Insufficient documentation

## 2020-12-09 DIAGNOSIS — K625 Hemorrhage of anus and rectum: Secondary | ICD-10-CM | POA: Diagnosis not present

## 2020-12-09 DIAGNOSIS — K921 Melena: Secondary | ICD-10-CM | POA: Diagnosis present

## 2020-12-09 DIAGNOSIS — R1084 Generalized abdominal pain: Secondary | ICD-10-CM | POA: Diagnosis not present

## 2020-12-09 LAB — CBC WITH DIFFERENTIAL/PLATELET
Abs Immature Granulocytes: 0.06 10*3/uL (ref 0.00–0.07)
Basophils Absolute: 0 10*3/uL (ref 0.0–0.1)
Basophils Relative: 0 %
Eosinophils Absolute: 0.4 10*3/uL (ref 0.0–0.5)
Eosinophils Relative: 5 %
HCT: 39.1 % (ref 36.0–46.0)
Hemoglobin: 12.1 g/dL (ref 12.0–15.0)
Immature Granulocytes: 1 %
Lymphocytes Relative: 28 %
Lymphs Abs: 2.6 10*3/uL (ref 0.7–4.0)
MCH: 20.9 pg — ABNORMAL LOW (ref 26.0–34.0)
MCHC: 30.9 g/dL (ref 30.0–36.0)
MCV: 67.5 fL — ABNORMAL LOW (ref 80.0–100.0)
Monocytes Absolute: 0.5 10*3/uL (ref 0.1–1.0)
Monocytes Relative: 5 %
Neutro Abs: 5.5 10*3/uL (ref 1.7–7.7)
Neutrophils Relative %: 61 %
Platelets: 325 10*3/uL (ref 150–400)
RBC: 5.79 MIL/uL — ABNORMAL HIGH (ref 3.87–5.11)
RDW: 20.9 % — ABNORMAL HIGH (ref 11.5–15.5)
WBC: 9.1 10*3/uL (ref 4.0–10.5)
nRBC: 0 % (ref 0.0–0.2)

## 2020-12-09 LAB — COMPREHENSIVE METABOLIC PANEL
ALT: 10 U/L (ref 0–44)
AST: 10 U/L — ABNORMAL LOW (ref 15–41)
Albumin: 3.7 g/dL (ref 3.5–5.0)
Alkaline Phosphatase: 72 U/L (ref 38–126)
Anion gap: 11 (ref 5–15)
BUN: 10 mg/dL (ref 6–20)
CO2: 24 mmol/L (ref 22–32)
Calcium: 9.3 mg/dL (ref 8.9–10.3)
Chloride: 105 mmol/L (ref 98–111)
Creatinine, Ser: 1.06 mg/dL — ABNORMAL HIGH (ref 0.44–1.00)
GFR, Estimated: >60 mL/min (ref >60)
Glucose, Bld: 106 mg/dL — ABNORMAL HIGH (ref 70–99)
Potassium: 3.4 mmol/L — ABNORMAL LOW (ref 3.5–5.1)
Sodium: 140 mmol/L (ref 135–145)
Total Bilirubin: 0.5 mg/dL (ref 0.3–1.2)
Total Protein: 7.5 g/dL (ref 6.5–8.1)

## 2020-12-09 LAB — URINALYSIS, ROUTINE W REFLEX MICROSCOPIC
Bilirubin Urine: NEGATIVE
Glucose, UA: NEGATIVE mg/dL
Hgb urine dipstick: NEGATIVE
Ketones, ur: NEGATIVE mg/dL
Leukocytes,Ua: NEGATIVE
Nitrite: NEGATIVE
Protein, ur: NEGATIVE mg/dL
Specific Gravity, Urine: 1.005 (ref 1.005–1.030)
pH: 8 (ref 5.0–8.0)

## 2020-12-09 LAB — TYPE AND SCREEN
ABO/RH(D): AB POS
Antibody Screen: NEGATIVE

## 2020-12-09 LAB — LIPASE, BLOOD: Lipase: 31 U/L (ref 11–51)

## 2020-12-09 MED ORDER — ONDANSETRON HCL 4 MG/2ML IJ SOLN
4.0000 mg | Freq: Once | INTRAMUSCULAR | Status: AC
  Administered 2020-12-09: 4 mg via INTRAVENOUS
  Filled 2020-12-09: qty 2

## 2020-12-09 MED ORDER — TRAMADOL HCL 50 MG PO TABS: 50.0000 mg | ORAL_TABLET | Freq: Four times a day (QID) | ORAL | 0 refills | Status: DC | PRN

## 2020-12-09 MED ORDER — IOHEXOL 300 MG/ML  SOLN
100.0000 mL | Freq: Once | INTRAMUSCULAR | Status: AC | PRN
  Administered 2020-12-09: 100 mL via INTRAVENOUS

## 2020-12-09 MED ORDER — HEPARIN SOD (PORK) LOCK FLUSH 100 UNIT/ML IV SOLN
500.0000 [IU] | INTRAVENOUS | Status: AC | PRN
  Administered 2020-12-09: 500 [IU]
  Filled 2020-12-09: qty 5

## 2020-12-09 MED ORDER — MORPHINE SULFATE (PF) 4 MG/ML IV SOLN
4.0000 mg | Freq: Once | INTRAVENOUS | Status: AC
Start: 2020-12-09 — End: 2020-12-09
  Administered 2020-12-09: 4 mg via INTRAVENOUS
  Filled 2020-12-09: qty 1

## 2020-12-09 MED ORDER — SODIUM CHLORIDE 0.9% FLUSH
10.0000 mL | INTRAVENOUS | Status: DC | PRN
Start: 2020-12-09 — End: 2020-12-10
  Administered 2020-12-09 (×2): 10 mL

## 2020-12-09 MED ORDER — CHLORHEXIDINE GLUCONATE CLOTH 2 % EX PADS: 6.0000 | MEDICATED_PAD | Freq: Every day | CUTANEOUS | Status: DC

## 2020-12-09 NOTE — Progress Notes (Signed)
Consult placed to access portacath. Pt in hallway. RN to move pt to room for procedure per pt request.

## 2020-12-09 NOTE — ED Provider Notes (Signed)
Patient placed in Quick Look pathway, seen and evaluated   Chief Complaint: left flank/abd pain for 1 week, rectal pain, rectal bleeding  HPI:   60 y/o F presents to the ED for eval of abd pain/left flank pain, rectal pain and rectal bleeding. Had an episode of bright red blood pta. States pain feels similar to her prior episodes of diverticulitis. Reports nv. Has had fevers at home. Pt also has hx of peptic ulcers. Is on xarelto.   ROS: abd pain/flank pain, rectal pain/bleeding (one)  Physical Exam:   Gen: No distress  Neuro: Awake and Alert  Skin: Warm    Focused Exam: LUQ, LLQ and left flank pain   Initiation of care has begun. The patient has been counseled on the process, plan, and necessity for staying for the completion/evaluation, and the remainder of the medical screening examination  MSE was initiated and I personally evaluated the patient and placed orders (if any) at  4:03 PM on December 09, 2020.  The patient appears stable so that the remainder of the MSE may be completed by another provider.    Bishop [place] 12/09/20 1603    Carmin Muskrat, MD 12/09/20 2311

## 2020-12-09 NOTE — ED Notes (Signed)
ED Provider at bedside. 

## 2020-12-09 NOTE — ED Triage Notes (Addendum)
C/o L sided abd pain that radiates to L flank x 1 week.  Reports bright red rectal bleeding with clots that started today.  Takes blood thinner.  Pt also reports syncopal episodes over the last week.

## 2020-12-09 NOTE — ED Notes (Signed)
Patient moved to room 36 for port access procedure.

## 2020-12-09 NOTE — Discharge Instructions (Signed)
Call your GI doctor in 2 or 3 days.  Return immediately if your bleeding becomes worse if your pain becomes worse or if you have any additional concerns.

## 2020-12-09 NOTE — ED Provider Notes (Signed)
[facility] EMERGENCY DEPARTMENT Provider Note   CSN: [id number] Arrival date & time: 12/09/20  1545     History Chief Complaint  Patient presents with  . Abdominal Pain  . Rectal Bleeding    [name] [name] is a 60 y.o. female.  Patient presents with chief complaint of noticing blood in her stool today.  Describes it as appearing bright red.  Denies bleeding otherwise except during her bowel movement.  Has had episodes of abdominal pain for the past 2 days as well describes as cramping and aching in the left lower quadrant.  Worse when she pushes on the area.  Otherwise denies fevers or cough.  No vomiting or diarrhea.        Past Medical History:  Diagnosis Date  . Anxiety   . Arthritis   . CHF exacerbation ([place]) 08/21/2017  . Depression   . Depression   . Difficult intubation    See [facility] anesthesia records in [place]  . Diverticulitis   . Dyspnea    with exertion  . Family history of adverse reaction to anesthesia    " My mother stopped breathing during procedure"  . GERD (gastroesophageal reflux disease)    PMH; resolved  . Hyperlipidemia   . Hypertension   . Obesity   . Plantar fasciitis, left   . Pneumonia   . Port-A-Cath in place   . Pre-diabetes   . Pulmonary embolism ([place])   . Pulmonary hypertension ([place])   . Sleep apnea    wears CPAP # 9  . Wears glasses     Patient Active Problem List   Diagnosis Date Noted  . GI bleed 02/25/2020  . Pyelonephritis   . Primary osteoarthritis of right knee 09/30/2019  . Achilles tendon contracture, left   . Leg pain, left 07/10/2018  . Bilateral primary osteoarthritis of knee 05/12/2018  . Plantar fasciitis of left foot 05/12/2018  . Shortness of breath 01/16/2018  . Palliative care encounter 01/16/2018  . Major depressive disorder, recurrent severe without psychotic features ([place]) 12/06/2017  . CHF exacerbation ([place]) 08/21/2017  . (HFpEF) heart failure with preserved ejection  fraction ([place]) 08/20/2017  . Lumbar radiculopathy 08/08/2017  . Body mass index 50.0-59.9, adult ([place]) 08/08/2017  . Hoarseness 08/08/2017  . Chronic cough 04/10/2017  . Rheumatoid arthritis involving multiple sites ([place]) 04/10/2017  . Anxiety and depression 04/10/2017  . Adjustment disorder with depressed mood 12/11/2016  . Morbid obesity ([place]) 08/14/2016  . Stroke ([place]) 06/27/2016  . Right sided weakness 06/27/2016  . Essential hypertension 06/27/2016  . Atypical chest pain 06/27/2016  . Difficult airway 03/29/2016  . CTEPH (chronic thromboembolic pulmonary hypertension) ([place]) 03/14/2016  . OSA on CPAP 02/20/2016  . Chronic pain syndrome 01/31/2016  . Pulmonary embolus ([place]) 11/15/2015  . Vertigo 11/15/2015  . Depression 11/15/2015    Past Surgical History:  Procedure Laterality Date  . ABDOMINAL HYSTERECTOMY    . BIOPSY  02/26/2020   Procedure: BIOPSY;  Surgeon: Ronnette Juniper, MD;  Location: [facility];  Service: Gastroenterology;;  . BREAST REDUCTION SURGERY    . CHOLECYSTECTOMY    . COLONOSCOPY W/ BIOPSIES AND POLYPECTOMY    . EMBOLECTOMY N/A    pulmonary embolectomy  . ESOPHAGOGASTRODUODENOSCOPY N/A 02/26/2020   Procedure: ESOPHAGOGASTRODUODENOSCOPY (EGD);  Surgeon: Ronnette Juniper, MD;  Location: [facility];  Service: Gastroenterology;  Laterality: N/A;  . FRACTURE SURGERY     right ankle  . GASTROCNEMIUS RECESSION Left 02/12/2019   Procedure: LEFT GASTROCNEMIUS RECESSION,  PLANTAR FASCIA RELEASE;  Surgeon: Newt Minion, MD;  Location: [facility];  Service: Orthopedics;  Laterality: Left;  . IVC FILTER INSERTION    . REDUCTION MAMMAPLASTY Bilateral   . RIGHT HEART CATH N/A 10/28/2016   Procedure: Right Heart Cath;  Surgeon: Jolaine Artist, MD;  Location: [facility];  Service: Cardiovascular;  Laterality: N/A;  . RIGHT HEART CATH N/A 08/26/2017   Procedure: RIGHT HEART CATH;  Surgeon: Jolaine Artist, MD;  Location: [facility];  Service: Cardiovascular;   Laterality: N/A;  . RIGHT HEART CATH N/A 07/05/2019   Procedure: RIGHT HEART CATH;  Surgeon: Jolaine Artist, MD;  Location: [facility];  Service: Cardiovascular;  Laterality: N/A;  . RIGHT HEART CATH N/A 07/24/2020   Procedure: RIGHT HEART CATH;  Surgeon: Jolaine Artist, MD;  Location: [facility];  Service: Cardiovascular;  Laterality: N/A;  . ULTRASOUND GUIDANCE FOR VASCULAR ACCESS  08/26/2017   Procedure: Ultrasound Guidance For Vascular Access;  Surgeon: Jolaine Artist, MD;  Location: [facility];  Service: Cardiovascular;;  . WISDOM TOOTH EXTRACTION       OB History   No obstetric history on file.     Family History  Problem Relation Age of Onset  . Lung cancer Maternal Grandmother   . Dementia Mother   . Dementia Father   . Anxiety disorder Sister   . Bipolar disorder Sister   . Drug abuse Sister   . Sexual abuse Maternal Aunt   . Anxiety disorder Cousin   . Anxiety disorder Sister   . Bipolar disorder Sister   . Drug abuse Sister   . Breast cancer Neg Hx     Social History   Tobacco Use  . Smoking status: Never Smoker  . Smokeless tobacco: Never Used  Vaping Use  . Vaping Use: Never used  Substance Use Topics  . Alcohol use: No  . Drug use: No    Home Medications Prior to Admission medications   Medication Sig Start Date End Date Taking? Authorizing Provider  traMADol (ULTRAM) 50 MG tablet Take 1 tablet (50 mg total) by mouth every 6 (six) hours as needed for up to 8 doses. 12/09/20  Yes Luna Fuse, MD  acetaminophen-codeine (TYLENOL #4) 300-60 MG tablet Take 1 tablet by mouth every 4 (four) hours as needed for pain.    [provider]  albuterol (VENTOLIN HFA) 108 (90 Base) MCG/ACT inhaler Inhale 2 puffs into the lungs every 6 (six) hours as needed for wheezing or shortness of breath. 11/24/19   [provider]  atorvastatin (LIPITOR) 20 MG tablet Take 1 tablet (20 mg total) by mouth daily at 6 PM. 09/17/18    Ladell Pier, MD  diclofenac sodium (VOLTAREN) 1 % GEL Apply 2 g topically 4 (four) times daily as needed (knee and back pain).    [provider]  docusate sodium (COLACE) 100 MG capsule Take 1 capsule (100 mg total) by mouth every 12 (twelve) hours. 10/24/16   Harris, Abigail, PA-C  fluticasone (FLONASE) 50 MCG/ACT nasal spray Place 2 sprays into both nostrils daily. Patient taking differently: Place 2 sprays into both nostrils daily as needed for allergies.  02/04/17   Maren Reamer, MD  furosemide (LASIX) 40 MG tablet Take 1 tablet (40 mg total) by mouth daily. Take extra 40 mg tablet once in the afternoon AS NEEDED for weight gain 3 lbs or more. 11/13/20   Bensimhon,  Shaune Pascal, MD  gabapentin (NEURONTIN) 300 MG capsule Take 300 mg by mouth 3 (three) times daily.     [provider]  meclizine (ANTIVERT) 25 MG tablet Take 25 mg by mouth 3 (three) times daily as needed for dizziness.    [provider]  montelukast (SINGULAIR) 10 MG tablet Take 10 mg by mouth every evening. 02/21/20   [provider]  omeprazole (PRILOSEC) 40 MG capsule Take 40 mg by mouth 2 (two) times daily.    [provider]  ondansetron (ZOFRAN) 4 MG tablet Take 4 mg by mouth every 8 (eight) hours as needed for nausea or vomiting.    [provider]  polyethylene glycol powder (GLYCOLAX/MIRALAX) powder Take 17 g by mouth 2 (two) times daily as needed. Patient taking differently: Take 17 g by mouth 2 (two) times daily as needed for mild constipation.  06/03/18   Argentina Donovan, PA-C  QUEtiapine (SEROQUEL) 50 MG tablet Take 50-100 mg by mouth at bedtime as needed (sleep).  07/14/20   [provider]  Riociguat (ADEMPAS) 2 MG TABS Take 2 mg by mouth 3 (three) times daily. 11/10/20   Bensimhon, Shaune Pascal, MD  rivaroxaban (XARELTO) 20 MG TABS tablet TAKE 1 TABLET BY MOUTH ONCE DAILY WITH SUPPER Patient taking differently: Take 20 mg by mouth daily with supper.   07/19/20   Bensimhon, Shaune Pascal, MD  spironolactone (ALDACTONE) 25 MG tablet Take 0.5 tablets (12.5 mg total) by mouth daily. Patient taking differently: Take 25 mg by mouth daily.  07/19/20   Bensimhon, Shaune Pascal, MD  temazepam (RESTORIL) 30 MG capsule Take 30-60 mg by mouth at bedtime as needed for sleep.  07/04/20   [provider]    Allergies    [name].  Review of Systems   Review of Systems  Constitutional: Negative for fever.  HENT: Negative for ear pain.   Eyes: Negative for pain.  Respiratory: Negative for cough.   Cardiovascular: Negative for chest pain.  Gastrointestinal: Positive for abdominal pain.  Genitourinary: Negative for flank pain.  Musculoskeletal: Negative for back pain.  Skin: Negative for rash.  Neurological: Negative for headaches.    Physical Exam Updated Vital Signs BP 124/65 (BP Location: Right Wrist)   Pulse 71   Temp 99.1 F (37.3 C) (Oral)   Resp 18   SpO2 100%   Physical Exam Constitutional:      General: She is not in acute distress.    Appearance: Normal appearance.  HENT:     Head: Normocephalic.     Nose: Nose normal.  Eyes:     Extraocular Movements: Extraocular movements intact.  Cardiovascular:     Rate and Rhythm: Normal rate.  Pulmonary:     Effort: Pulmonary effort is normal.  Abdominal:     Tenderness: There is abdominal tenderness in the left lower quadrant. There is no guarding or rebound.  Musculoskeletal:        General: Normal range of motion.     Cervical back: Normal range of motion.  Neurological:     General: No focal deficit present.     Mental Status: She is alert. Mental status is at baseline.     ED Results / Procedures / Treatments   Labs (all labs ordered are listed, but only abnormal results are displayed) Labs Reviewed  COMPREHENSIVE METABOLIC PANEL - Abnormal; Notable for the following components:      Result Value   Potassium 3.4 (*)  Glucose, Bld 106 (*)     Creatinine, Ser 1.06 (*)    AST 10 (*)    All other components within normal limits  CBC WITH DIFFERENTIAL/PLATELET - Abnormal; Notable for the following components:   RBC 5.79 (*)    MCV 67.5 (*)    MCH 20.9 (*)    RDW 20.9 (*)    All other components within normal limits  URINALYSIS, ROUTINE W REFLEX MICROSCOPIC - Abnormal; Notable for the following components:   Color, Urine COLORLESS (*)    All other components within normal limits  LIPASE, BLOOD  POC OCCULT BLOOD, ED  TYPE AND SCREEN    EKG EKG Interpretation  Date/Time:  Saturday December 09 2020 16:09:07 EDT Ventricular Rate:  83 PR Interval:  178 QRS Duration: 134 QT Interval:  412 QTC Calculation: 484 R Axis:   51 Text Interpretation: Normal sinus rhythm with sinus arrhythmia Right bundle branch block Abnormal ECG Confirmed by Thamas Jaegers ([id number]) on 12/09/2020 6:59:06 PM   Radiology CT ABDOMEN PELVIS W CONTRAST  Result Date: 12/09/2020 CLINICAL DATA:  Abdominal pain, rectal bleeding EXAM: CT ABDOMEN AND PELVIS WITH CONTRAST TECHNIQUE: Multidetector CT imaging of the abdomen and pelvis was performed using the standard protocol following bolus administration of intravenous contrast. CONTRAST:  136mL OMNIPAQUE IOHEXOL 300 MG/ML  SOLN COMPARISON:  02/25/2020 FINDINGS: Lower chest: Bibasilar scarring.  No acute abnormality. Hepatobiliary: No focal liver abnormality is seen. Status post cholecystectomy. No biliary dilatation. Pancreas: No focal abnormality or ductal dilatation. Spleen: No focal abnormality.  Normal size. Adrenals/Urinary Tract: Small nodule in the right adrenal gland is stable. No renal mass or hydronephrosis. Urinary bladder unremarkable. Stomach/Bowel: Normal appendix. Stomach, large and small bowel grossly unremarkable. Vascular/Lymphatic: No evidence of aneurysm or adenopathy. Infrarenal IVC filter in place. Reproductive: Prior hysterectomy.  No adnexal masses. Other: No free fluid or free air. Musculoskeletal: No  acute bony abnormality. IMPRESSION: No acute findings. Aortic atherosclerosis. Electronically Signed   By: Rolm Baptise M.D.   On: 12/09/2020 22:20    Procedures Procedures   Medications Ordered in ED Medications  sodium chloride flush (NS) 0.9 % injection 10-40 mL (10 mLs Intracatheter Given 12/09/20 2153)  Chlorhexidine Gluconate Cloth 2 % PADS 6 each (0 each Topical Hold 12/09/20 2153)  morphine 4 MG/ML injection 4 mg (4 mg Intravenous Given 12/09/20 2151)  ondansetron (ZOFRAN) injection 4 mg (4 mg Intravenous Given 12/09/20 2150)  iohexol (OMNIPAQUE) 300 MG/ML solution 100 mL (100 mLs Intravenous Contrast Given 12/09/20 2212)    ED Course  I have reviewed the triage vital signs and the nursing notes.  Pertinent labs & imaging results that were available during my care of the patient were reviewed by me and considered in my medical decision making (see chart for details).    MDM Rules/Calculators/A&P                          Labs show white count of 9 hemoglobin 12.1, this is at her normal level compared to past hemoglobins.  Chemistry otherwise unremarkable as well.  Urinalysis negative.  CT abdomen pelvis pursued with no acute findings.  Etiology of patient's symptoms unclear.  Recommending follow-up with her GI physician she states she has a GI doctor with whom she follows.  Advised her to call her doctor in 3 to 4 days.  Advised immediate return for heavy bleeding worsening pain fevers shortness of breath or any additional concerns.  Final Clinical Impression(s) /  ED Diagnoses Final diagnoses:  Rectal bleeding  Generalized abdominal pain    Rx / DC Orders ED Discharge Orders         Ordered    traMADol (ULTRAM) 50 MG tablet  Every 6 hours PRN        12/09/20 2302           Luna Fuse, MD 12/09/20 2302

## 2020-12-12 ENCOUNTER — Telehealth: Payer: Self-pay | Admitting: *Deleted

## 2020-12-12 NOTE — Telephone Encounter (Signed)
Called and spoke with patient, she states she has not received her CPAP machine, but she has the one that has been recalled.  She was asking about the HST that she took 2 weeks ago, this test was not ordered by our office, this was ordered by her pcp office.  No one has called her with the results and she has called once already for the results.  She is going to call again to see if they can send the results to [place] for Dr. Vaughan Browner to review.  Nothing further needed.

## 2020-12-13 ENCOUNTER — Ambulatory Visit: Payer: Medicare HMO | Admitting: Pulmonary Disease

## 2020-12-13 ENCOUNTER — Other Ambulatory Visit: Payer: Self-pay

## 2020-12-13 ENCOUNTER — Encounter: Payer: Self-pay | Admitting: Pulmonary Disease

## 2020-12-13 VITALS — BP 130/72 | HR 115 | Temp 97.7°F | Ht 63.0 in | Wt 328.4 lb

## 2020-12-13 DIAGNOSIS — I2724 Chronic thromboembolic pulmonary hypertension: Secondary | ICD-10-CM | POA: Diagnosis not present

## 2020-12-13 DIAGNOSIS — G4733 Obstructive sleep apnea (adult) (pediatric): Secondary | ICD-10-CM

## 2020-12-13 DIAGNOSIS — Z9989 Dependence on other enabling machines and devices: Secondary | ICD-10-CM

## 2020-12-13 NOTE — Progress Notes (Addendum)
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 is a 60 year old with past medical history of morbid obesity, obstructive sleep apnea, hypertension. She was admitted in January 2017 at [place], [place] for saddle embolus. She was initially on heparin anticoagulation and transition to Xarelto. Since discharge has been complaining of dyspnea, cough with white sputum production, episodes of dizziness, right chest pain. She was reevaluated with a CT scan which showed persistent emboli and an echocardiogram which showed severe pulmonary hypertension. S/p thromboendarterectomy at [facility] in 2017. She reports that the anesthetists had a very hard time intubating her. It took a long time to secure the airway. Post procedure she seems to be stable. She was revaluated in ED for persistent chest pain in Sept 2017. She has a CTA done which shows partial recanalization of the thrombus.   She has history of obstructive sleep apnea diagnosed in 2015. She had been maintained on CPAP.  8/11/202:Follows with Dr. Haroldine Laws for pulmonary hypertension and is on Riociguat.  Dose was reduced to 1.5 mg 3 times a day in 2019 due to low blood pressure.  She continues on Xarelto anticoagulation  On CPAP for sleep apnea.  Compliant with therapy.  Interim history:  Continues on Adempas and Xarelto.  Breathing is stable  Undergoing evaluation by GI for diarrhea, rectal bleed.  Colonoscopy, EGD planned.  Outpatient Encounter Medications as of 12/13/2020  Medication Sig  . acetaminophen-codeine (TYLENOL #4) 300-60 MG tablet Take 1 tablet by mouth every 4 (four) hours as needed for pain.  Marland Kitchen albuterol (VENTOLIN HFA) 108 (90  Base) MCG/ACT inhaler Inhale 2 puffs into the lungs every 6 (six) hours as needed for wheezing or shortness of breath.  Marland Kitchen atorvastatin (LIPITOR) 20 MG tablet Take 1 tablet (20 mg total) by mouth daily at 6 PM.  . diclofenac sodium (VOLTAREN) 1 % GEL Apply 2 g topically 4 (four) times daily as needed (knee and back pain).  Marland Kitchen docusate sodium (COLACE) 100 MG capsule Take 1 capsule (100 mg total) by mouth every 12 (twelve) hours.  . fluticasone (FLONASE) 50 MCG/ACT nasal spray Place 2 sprays into both nostrils daily. (Patient taking differently: Place 2 sprays into both nostrils daily as needed for allergies.)  . furosemide (LASIX) 40 MG tablet Take 1 tablet (40 mg total) by mouth daily. Take extra 40 mg tablet once in the afternoon AS NEEDED for weight gain 3 lbs or more.  . gabapentin (NEURONTIN) 300 MG capsule Take 300 mg by mouth 3 (three) times daily.   . meclizine (ANTIVERT) 25 MG tablet Take 25 mg by mouth 3 (three) times daily as needed for dizziness.  . montelukast (SINGULAIR) 10 MG tablet Take 10 mg by mouth every evening.  Marland Kitchen omeprazole (PRILOSEC) 40 MG capsule Take 40 mg by mouth 2 (two) times daily.  . ondansetron (ZOFRAN) 4 MG tablet Take 4 mg by mouth every 8 (eight) hours as needed for nausea or vomiting.  . polyethylene glycol powder (GLYCOLAX/MIRALAX) powder Take 17 g by mouth 2 (two) times daily as needed. (Patient taking differently: Take 17 g by mouth 2 (two) times daily as needed for mild constipation.)  . QUEtiapine (SEROQUEL) 50 MG tablet  Take 50-100 mg by mouth at bedtime as needed (sleep).   . Riociguat (ADEMPAS) 2 MG TABS Take 2 mg by mouth 3 (three) times daily.  . rivaroxaban (XARELTO) 20 MG TABS tablet TAKE 1 TABLET BY MOUTH ONCE DAILY WITH SUPPER (Patient taking differently: Take 20 mg by mouth daily with supper.)  . spironolactone (ALDACTONE) 25 MG tablet Take 0.5 tablets (12.5 mg total) by mouth daily. (Patient taking differently: Take 25 mg by mouth daily.)  . temazepam  (RESTORIL) 30 MG capsule Take 30-60 mg by mouth at bedtime as needed for sleep.   . traMADol (ULTRAM) 50 MG tablet Take 1 tablet (50 mg total) by mouth every 6 (six) hours as needed for up to 8 doses.   No facility-administered encounter medications on file as of 12/13/2020.  .surg Physical Exam: Blood pressure 104/62, pulse 63, height 5\' 3"  (1.6 m), weight (!) 309 lb 3.2 oz (140.3 kg), SpO2 98 %. Gen:      No acute distress HEENT:  EOMI, sclera anicteric Neck:     No masses; no thyromegaly Lungs:    Clear to auscultation bilaterally; normal respiratory effort CV:         Regular rate and rhythm; no murmurs Abd:      + bowel sounds; soft, non-tender; no palpable masses, no distension Ext:    No edema; adequate peripheral perfusion Skin:      Warm and dry; no rash Neuro: alert and oriented x 3 Psych: normal mood and affect  Data Reviewed: Imaging: CT scan 12/25/15 Right lower lobe and left lower lobe pulmonary embolism. Images reviewed  CT scan 02/08/16 Persistent RLL embolus  CT scan 05/15/16 Persistent but partially recanalized pulmonary embolus within the right lower lobe pulmonary artery.  CTA 09/19/2017- no acute pulmonary embolism.  Evidence for chronic PE in the right lower lobe.  Bibasilar atelectasis  CT chest 09/26/2017-bibasilar atelectasis I reviewed the images personally.  Cardiac: Echo 07/01/2018- of EF 79-02%, grade 1 diastolic dysfunction, RV pressure overload with dilatation, hypokinesis, PA peak pressure 62  Echo 06/02/2019-EF 60 to 65% , RV with hypokinesis  RHC 08/2017 RA = 6 RV = 50/7 PA = 48/13 (30) PCW = 6 Fick cardiac output/index = 7.0/2.9 PVR = 3.1 WU Ao sat = 97% PA sat = 67%, 70% SVC sat = 70%  RHC 10/2016  RA = 8 RV = 64/13 PA = 65/19 (35) PCW = 9 Fick cardiac output/index = 7.5/3.1 PVR = 3.5 WU Ao sat = 96% PA sat = 68%,70% SVC sat = 71%  RHC 07/05/2019 RA = 7 RV = 50/12 PA = 50/16 (28) PCW = 11 Fick cardiac output/index =  8.1/3.4 PVR = 2.0 WU FA sat = 99% PA sat = 72%, 77%   Labs 01/10/16 HIV- Neg LFTs- WNL Jo-1, centromere, dsDNA, RNP, Ro, La, RA, Scl-70- All negative  Assessment:  Chronic thromboembolic PHTN S/p thromboendarterectomy at [facility]. Clinically she is doing well She continues on anticoagulation and Riociguat  IVC filter placed on 03/25/2016 Follow with Dr. Haroldine Laws  Severe OSA Compliant with CPAP. She recently had a home sleep study ordered by primary care.  Do not have the results of these to review Get records from primary care   Plan/Recommendations: - Continue Xarelto, [place] records from primary care - Continue CPAP  [name] MD [facility] 12/13/2020, 10:31 AM  CC: Ruthine Dose, MD  Addendum: Received home sleep study from primary care dated 11/22/2020 Oxygen desaturation index  3.2, AHI 0.3 Time spent less than 88% 0.1 minutes  No evidence of sleep apnea.

## 2020-12-13 NOTE — Patient Instructions (Addendum)
Glad you are doing well with regard to your breathing Continue the medications for pulmonary hypertension Continue the CPAP at night We will attempt to get the results of your recent home sleep study  Follow-up in 1 year.

## 2021-01-02 ENCOUNTER — Encounter (HOSPITAL_COMMUNITY): Payer: Medicare HMO

## 2021-01-22 ENCOUNTER — Telehealth (HOSPITAL_COMMUNITY): Payer: Self-pay

## 2021-01-22 NOTE — Telephone Encounter (Signed)
Pt insurance is active and benefits verified through [facility] ([place]). Co-pay $10.00, DED $0.00/$0.00 met, out of pocket $3,900.00/$854.45 met, co-insurance 0%. No pre-authorization required. Vern L./[employer], 5/11 @ 412PM, EZB#[id number]  Will contact patient to see if she is interested in the Pulmonary Rehab Program.

## 2021-01-22 NOTE — Telephone Encounter (Signed)
Called patient to see if she is interested in the Pulmonary Rehab Program. Patient expressed interest. Explained scheduling process and went over insurance, patient verbalized understanding. Will contact patient for scheduling at a later date. (1-3 months) 

## 2021-01-23 ENCOUNTER — Other Ambulatory Visit: Payer: Self-pay

## 2021-01-23 ENCOUNTER — Other Ambulatory Visit: Payer: Self-pay | Admitting: Student

## 2021-01-23 DIAGNOSIS — K6289 Other specified diseases of anus and rectum: Secondary | ICD-10-CM

## 2021-01-23 DIAGNOSIS — Z515 Encounter for palliative care: Secondary | ICD-10-CM

## 2021-01-23 DIAGNOSIS — R0602 Shortness of breath: Secondary | ICD-10-CM

## 2021-01-23 DIAGNOSIS — F32A Depression, unspecified: Secondary | ICD-10-CM

## 2021-01-23 DIAGNOSIS — I5030 Unspecified diastolic (congestive) heart failure: Secondary | ICD-10-CM

## 2021-01-23 DIAGNOSIS — F419 Anxiety disorder, unspecified: Secondary | ICD-10-CM

## 2021-01-23 NOTE — Progress Notes (Signed)
[facility] Palliative Care Consult Note Telephone: [phone]  Fax: [phone]    Date of encounter: 01/23/21 PATIENT NAME: [name] [name] [address] [place] [address]   [phone] (home)  DOB: 1961-08-03 MRN: [id number] PRIMARY CARE PROVIDER:    Ruthine Dose, MD,  [place] [address] [phone]  REFERRING PROVIDER:   Ruthine Dose, [place],   [address] [phone]  RESPONSIBLE PARTY:    Contact Information    Name Relation Home Work [place] Spouse [phone]  [phone]   Aileen Fass [phone]  [phone]   [employer] Sister [phone]         I met face to face with patient in the home. Palliative Care was asked to follow this patient by consultation request of  Ruthine Dose, MD to address advance care planning and complex medical decision making. This is a follow up visit.                                   ASSESSMENT AND PLAN / RECOMMENDATIONS:   Advance Care Planning/Goals of Care: Goals include to maximize quality of life and symptom management. Our advance care planning conversation included a discussion about:     The value and importance of advance care planning   Experiences with loved ones who have been seriously ill or have died   Exploration of personal, cultural or spiritual beliefs that might influence medical decisions   Exploration of goals of care in the event of a sudden injury or illness   Identification and preparation of a healthcare agent   Introduced MOST form.  CODE STATUS: Full Code  Symptom Management/Plan:  Rectal pain-patient with anal fissure. She has upcoming appointments with GI on 02/06/21 and General surgery on 02/2721. Continue diltiazem 2% gel and lidocaine cream as directed.  Recommend starting acetaminophen 1000 mg BID routinely; continue taking Tylenol # 4 PRN as directed. Max daily dosage 3000 mg total.  Continue miralax 17 gm BID PRN, colace 160m BID. Recommend sitz bath for BID PRN pain.   Shortness of breath-secondary to heart failure, pulmonary hypertension. Continue furosemide 485mdaily, may take additional 4073mD PRN for weight gain 3lbs or more, spironolactone 12.5mg56mily, Adempas 2mg 61m,  albuterol inhaler 2 puffs every 6 hours PRN. Patient encouraged to weigh daily each AM. Follow up with Cardiology, Pulmonology as scheduled. Pulmonary rehab as directed.   Anxiety/depression-continue to follow up with psychiatry/support/counseling as scheduled. Lorazepam 0.5mg d1my PRN.    Follow up Palliative Care Visit: Palliative care will continue to follow for complex medical decision making, advance care planning, and clarification of goals. Return in 8 weeks or prn.  I spent 60 minutes providing this consultation. More than 50% of the time in this consultation was spent in counseling and care coordination.   PPS: 60%  HOSPICE ELIGIBILITY/DIAGNOSIS: TBD  Chief Complaint: Palliative Medicine follow up visit; rectal pain.  HISTORY OF PRESENT ILLNESS:  [name] Obi59 y.o76year old female  with abdominal pain, anal fissure,congestive heart failure, depression, anxiety. Dx also included chronic thromboembolic pulmonary hypertension.  Patient reports having moderate rectal pain, worse when having bowel movements. She had a colonoscopy on 01/09/21; results with no colitis, acute anal fissure, multiple colon biopsies taken. She is awaiting biopsy results. She reports a small amount of bright red bleeding a couple times a week. She  is using Lidocaine 2%, diltiazem 2% ointment tid. She is taking tylenol #4 occasionally. She endorses shortness of breath with exertion; rebounds with rest and deep breaths. Patient reports she has been referred for Pulmonary rehab secondary to her pulmonary hypertension. Endorses LE edema; weighs self occasionally. She has been seeing psychiatry, feels this is  much helpful. She is caring for her mother, who is currently in hospital and carries a great deal of responsibility/stress caring for her mother.  History obtained from review of EMR, discussion with primary team, and interview with family, facility staff/caregiver and/or Ms. Nazari.  I reviewed available labs, medications, imaging, studies and related documents from the EMR.  Records reviewed and summarized above.   ROS  General: NAD EYES: denies vision changes ENMT: denies dysphagia Cardiovascular: denies chest pain Pulmonary: denies cough, SOB with exertion Abdomen: endorses fair appetite, denies constipation, endorses continence of bowel, + rectal bleeding GU: denies dysuria, endorses continence of urine MSK: weakness,  no falls reported Skin: denies rashes or wounds Neurological: denies pain, denies insomnia Psych: Endorses stable mood Heme/lymph/immuno: denies bruises  Physical Exam: Pulse 84, resp 16, sats 97% on room air.  Constitutional: NAD General: frail appearing, obese  EYES: anicteric sclera, lids intact, no discharge  ENMT: intact hearing, oral mucous membranes moist, dentition intact CV: S1S2, RRR, trace LE edema Pulmonary: LCTA, no increased work of breathing, no cough, room air Abdomen: normo-active BS + 4 quadrants, soft and non tender GU: deferred MSK:  moves all extremities, ambulatory Skin: warm and dry, no rashes or wounds on visible skin Neuro: generalized weakness,  Psych: non-anxious affect, A & O x 4 Hem/lymph/immuno: no widespread bruising   Thank you for the opportunity to participate in the care of Ms. Kucharski.  The palliative care team will continue to follow. Please call our office at [phone] if we can be of additional assistance.   Ezekiel Slocumb, NP   COVID-19 PATIENT SCREENING TOOL Asked and negative response unless otherwise noted:   Have you had symptoms of covid, tested positive or been in contact with someone with  symptoms/positive test in the past 5-10 days? No

## 2021-01-26 ENCOUNTER — Telehealth (HOSPITAL_COMMUNITY): Payer: Self-pay

## 2021-01-26 NOTE — Telephone Encounter (Signed)
Called patient to see if she was interested in participating in the Pulmonary Rehab Program. Patient stated yes. Patient will come in for orientation on 02/23/21 @ 1:30PM and will attend the 1:15PM exercise class.  Tourist information centre manager.

## 2021-02-01 ENCOUNTER — Encounter (HOSPITAL_COMMUNITY): Payer: Medicare HMO

## 2021-02-08 ENCOUNTER — Encounter (HOSPITAL_COMMUNITY): Payer: Medicare HMO

## 2021-02-20 ENCOUNTER — Encounter (HOSPITAL_COMMUNITY): Payer: Medicare HMO

## 2021-02-21 ENCOUNTER — Telehealth (HOSPITAL_COMMUNITY): Payer: Self-pay | Admitting: Family Medicine

## 2021-02-23 ENCOUNTER — Ambulatory Visit (HOSPITAL_COMMUNITY): Payer: Medicare HMO

## 2021-02-27 ENCOUNTER — Ambulatory Visit (HOSPITAL_COMMUNITY): Payer: Medicare HMO

## 2021-03-01 ENCOUNTER — Ambulatory Visit (HOSPITAL_COMMUNITY): Payer: Medicare HMO

## 2021-03-06 ENCOUNTER — Ambulatory Visit (HOSPITAL_COMMUNITY): Payer: Medicare HMO

## 2021-03-08 ENCOUNTER — Ambulatory Visit: Payer: Medicare HMO | Admitting: Pulmonary Disease

## 2021-03-08 ENCOUNTER — Ambulatory Visit (HOSPITAL_COMMUNITY): Payer: Medicare HMO

## 2021-03-12 ENCOUNTER — Other Ambulatory Visit (HOSPITAL_COMMUNITY): Payer: Self-pay | Admitting: Internal Medicine

## 2021-03-13 ENCOUNTER — Ambulatory Visit (HOSPITAL_COMMUNITY): Payer: Medicare HMO

## 2021-03-15 ENCOUNTER — Other Ambulatory Visit (HOSPITAL_COMMUNITY): Payer: Self-pay | Admitting: Cardiology

## 2021-03-15 ENCOUNTER — Ambulatory Visit (HOSPITAL_COMMUNITY): Payer: Medicare HMO

## 2021-03-15 MED ORDER — RIVAROXABAN 20 MG PO TABS
ORAL_TABLET | ORAL | 0 refills | Status: DC
Start: 2021-03-15 — End: 2021-05-16

## 2021-03-20 ENCOUNTER — Ambulatory Visit (HOSPITAL_COMMUNITY): Payer: Medicare HMO

## 2021-03-22 ENCOUNTER — Ambulatory Visit (HOSPITAL_COMMUNITY): Payer: Medicare HMO

## 2021-03-27 ENCOUNTER — Ambulatory Visit (HOSPITAL_COMMUNITY): Payer: Medicare HMO

## 2021-03-29 ENCOUNTER — Ambulatory Visit (HOSPITAL_COMMUNITY): Payer: Medicare HMO

## 2021-04-03 ENCOUNTER — Ambulatory Visit (HOSPITAL_COMMUNITY): Payer: Medicare HMO

## 2021-04-05 ENCOUNTER — Ambulatory Visit (HOSPITAL_COMMUNITY): Payer: Medicare HMO

## 2021-04-09 ENCOUNTER — Telehealth: Payer: Self-pay

## 2021-04-09 NOTE — Telephone Encounter (Signed)
Called and spoke with patient about upcoming appointment, I asked if patient has received her new cpap and she stated no, nothing further needed.

## 2021-04-10 ENCOUNTER — Other Ambulatory Visit: Payer: Self-pay

## 2021-04-10 ENCOUNTER — Encounter: Payer: Self-pay | Admitting: Pulmonary Disease

## 2021-04-10 ENCOUNTER — Ambulatory Visit (HOSPITAL_COMMUNITY): Payer: Medicare HMO

## 2021-04-10 ENCOUNTER — Ambulatory Visit: Payer: Medicare HMO | Admitting: Pulmonary Disease

## 2021-04-10 VITALS — BP 128/86 | HR 86 | Ht 63.0 in | Wt 332.4 lb

## 2021-04-10 DIAGNOSIS — I272 Pulmonary hypertension, unspecified: Secondary | ICD-10-CM

## 2021-04-10 DIAGNOSIS — G4733 Obstructive sleep apnea (adult) (pediatric): Secondary | ICD-10-CM | POA: Diagnosis not present

## 2021-04-10 DIAGNOSIS — I2724 Chronic thromboembolic pulmonary hypertension: Secondary | ICD-10-CM | POA: Diagnosis not present

## 2021-04-10 DIAGNOSIS — Z9989 Dependence on other enabling machines and devices: Secondary | ICD-10-CM | POA: Diagnosis not present

## 2021-04-10 NOTE — Progress Notes (Signed)
[name] [name]    IU:[id number]    May 20, 1961  Primary Care Physician:Chau, Alfonse Spruce, MD  Referring Physician: Ruthine [name], [place],  [name] [name] [address]  Chief complaint:   Follow up for  PE Chronic thromboembolic pulmonary HTN s/p thromboendarterectomy at [facility], 03/28/16 Difficult airway OSA  HPI: [name] is a 60 year old with past medical history of morbid obesity, obstructive sleep apnea, hypertension. She was admitted in January 2017 at [place], [place] for saddle embolus. She was initially on heparin anticoagulation and transition to Xarelto. Since discharge has been complaining of dyspnea, cough with white sputum production, episodes of dizziness, right chest pain. She was reevaluated with a CT scan which showed persistent emboli and an echocardiogram which showed severe pulmonary hypertension. S/p thromboendarterectomy at [facility] in 2017.  She reports that the anesthetists had a very hard time intubating her. It took a long time to secure the airway. Post procedure she seems to be stable. She was revaluated in ED for persistent chest pain in Sept 2017. She has a CTA done which shows partial recanalization of the thrombus.   She has history of obstructive sleep apnea diagnosed in 2015. She had been maintained on CPAP.  8/11/202:Follows with Dr. Haroldine [name] for pulmonary hypertension and is on Riociguat.  [name] was reduced to 1.5 mg 3 times a day in 2019 due to low blood pressure.  She continues on Xarelto anticoagulation  On CPAP for sleep apnea.  Compliant with therapy.  Interim history:  Continues on Adempas and Xarelto.  Breathing is stable She had an EGD done in July 2022 for evaluation of diarrhea and possible rectal bleed with normal findings.  Home sleep study performed by primary care did not show any sleep apnea and hence insurance company is denying CPAP.  Outpatient Encounter Medications as of 04/10/2021  Medication Sig   albuterol  (VENTOLIN HFA) 108 (90 Base) MCG/ACT inhaler Inhale 2 puffs into the lungs every 6 (six) hours as needed for wheezing or shortness of breath.   atorvastatin (LIPITOR) 20 MG tablet Take 1 tablet (20 mg total) by mouth daily at 6 PM.   diclofenac sodium (VOLTAREN) 1 % GEL Apply 2 g topically 4 (four) times daily as needed (knee and back pain).   diltiazem 2 % GEL Apply 1 application topically 3 (three) times daily.   docusate sodium (COLACE) 100 MG capsule Take 1 capsule (100 mg total) by mouth every 12 (twelve) hours.   fluticasone (FLONASE) 50 MCG/ACT nasal spray Place 2 sprays into both nostrils daily. (Patient taking differently: Place 2 sprays into both nostrils daily as needed for allergies.)   furosemide (LASIX) 40 MG tablet Take 1 tablet (40 mg total) by mouth daily. Take extra 40 mg tablet once in the afternoon AS NEEDED for weight gain 3 lbs or more.   gabapentin (NEURONTIN) 300 MG capsule Take 300 mg by mouth 3 (three) times daily. Usually taking twice a day.   HYDROcodone-acetaminophen (NORCO) 10-325 MG tablet TAKE 1 TABLET BY MOUTH UP TO THREE TIMES DAILY AS NEEDED   LORazepam (ATIVAN) 0.5 MG tablet Take 0.5 mg by mouth at bedtime as needed for anxiety.   LORazepam (ATIVAN) 1 MG tablet Take by mouth.   meclizine (ANTIVERT) 25 MG tablet Take 25 mg by mouth 3 (three) times daily as needed for dizziness.   montelukast (SINGULAIR) 10 MG tablet Take 10 mg by mouth every evening.   omeprazole (PRILOSEC) 40 MG capsule Take  40 mg by mouth 2 (two) times daily.   ondansetron (ZOFRAN) 4 MG tablet Take 4 mg by mouth every 8 (eight) hours as needed for nausea or vomiting.   polyethylene glycol powder (GLYCOLAX/MIRALAX) powder Take 17 g by mouth 2 (two) times daily as needed. (Patient taking differently: Take 17 g by mouth 2 (two) times daily as needed for mild constipation.)   QUEtiapine (SEROQUEL) 50 MG tablet Take 50-100 mg by mouth at bedtime as needed (sleep).    Riociguat (ADEMPAS) 2 MG TABS Take  2 mg by mouth 3 (three) times daily.   rivaroxaban (XARELTO) 20 MG TABS tablet TAKE 1 TABLET BY MOUTH ONCE DAILY WITH SUPPER . APPOINTMENT REQUIRED FOR FUTURE REFILLS   spironolactone (ALDACTONE) 25 MG tablet Take 0.5 tablets (12.5 mg total) by mouth daily. (Patient taking differently: Take 25 mg by mouth daily.)   temazepam (RESTORIL) 30 MG capsule Take 30-60 mg by mouth at bedtime as needed for sleep.    tiZANidine (ZANAFLEX) 2 MG tablet Take by mouth.   traMADol (ULTRAM) 50 MG tablet Take 1 tablet (50 mg total) by mouth every 6 (six) hours as needed for up to 8 doses.   venlafaxine XR (EFFEXOR-XR) 75 MG 24 hr capsule Take by mouth.   [DISCONTINUED] acetaminophen-codeine (TYLENOL #4) 300-60 MG tablet Take 1 tablet by mouth every 4 (four) hours as needed for pain.   No facility-administered encounter medications on file as of 04/10/2021.   Physical Exam: Blood pressure 128/86, pulse 86, height '5\' 3"'$  (1.6 m), weight (!) 332 lb 6.4 oz (150.8 kg), SpO2 97 %. Gen:      No acute distress HEENT:  EOMI, sclera anicteric Neck:     No masses; no thyromegaly Lungs:    Clear to auscultation bilaterally; normal respiratory effort CV:         Regular rate and rhythm; no murmurs Abd:      + bowel sounds; soft, non-tender; no palpable masses, no distension Ext:    No edema; adequate peripheral perfusion Skin:      Warm and dry; no rash Neuro: alert and oriented x 3 Psych: normal mood and affect   Data Reviewed: Imaging: CT scan 12/25/15 Right lower lobe and left lower lobe pulmonary embolism. Images reviewed  CT scan 02/08/16 Persistent RLL embolus  CT scan 05/15/16 Persistent but partially recanalized pulmonary embolus within the right lower lobe pulmonary artery.  CTA 09/19/2017- no acute pulmonary embolism.  Evidence for chronic PE in the right lower lobe.  Bibasilar atelectasis  CT chest 09/26/2017-bibasilar atelectasis I reviewed the images personally.  Cardiac: Echo 07/01/2018- of EF 55-60%,  grade 1 diastolic dysfunction, RV pressure overload with dilatation, hypokinesis, PA peak pressure 62  Echo 06/02/2019-EF 60 to 65% , RV with hypokinesis  RHC 08/2017 RA = 6 RV = 50/7 PA = 48/13 (30) PCW = 6 Fick cardiac output/index = 7.0/2.9 PVR = 3.1 WU Ao sat = 97% PA sat = 67%, 70% SVC sat = 70%   RHC 10/2016  RA = 8 RV = 64/13 PA = 65/19 (35) PCW = 9 Fick cardiac output/index = 7.5/3.1 PVR = 3.5 WU Ao sat = 96% PA sat = 68%,70% SVC sat = 71%   RHC 07/05/2019  RA = 7 RV = 50/12 PA = 50/16 (28) PCW = 11 Fick cardiac output/index = 8.1/3.4 PVR = 2.0 WU FA sat = 99% PA sat = 72%, 77%  RHC 07/24/2020 RA = 8 RV = 49/10 PA = 52/18 (  30) PCW = 12 Fick cardiac output/index = 6.5/2.7 PVR = 2.8 WU FA sat = 98% PA sat = 67%, 68% PaPi = 3.6    Labs 01/10/16 HIV- Neg LFTs- WNL Jo-1, centromere, dsDNA, RNP, Ro, La, RA, Scl-70- All negative  Sleep  Received home sleep study from primary care dated 11/22/2020 Oxygen desaturation index 3.2, AHI 0.3 Time spent less than 88% 0.1 minutes No evidence of sleep apnea.  Assessment:  Chronic thromboembolic PHTN S/p thromboendarterectomy at [facility]. Clinically she is doing well She continues on anticoagulation and Riociguat (reduced [name] due to episodes of hypotension) IVC filter placed on 03/25/2016 RHC from Nov 2021 reviewed Follows with Dr. Haroldine [name]  She has been referred to pulmonary rehab but did not follow-up.  We will make a referral again  Severe OSA Compliant with CPAP. Although the 2015 study showed severe sleep apnea her home study showed low AHI and no desaturation I suspect the home sleep study is underestimating the severity of her apnea and we will order a split-night sleep study for evaluation She recently had a home sleep study ordered by primary care.  Do not have the results of these to review Get records from primary care  Plan/Recommendations: - Continue Xarelto, Riociugat - Split-night sleep  study - Pulmonary rehab  [name] MD [facility] 04/10/2021, 9:00 AM  CC: [name] Dose, MD

## 2021-04-10 NOTE — Patient Instructions (Signed)
We will order a split-night in lab sleep study for reevaluation of sleep apnea Referral for pulmonary rehab for pulmonary hypertension Follow-up in 6 months

## 2021-04-11 ENCOUNTER — Encounter (HOSPITAL_COMMUNITY): Payer: Self-pay | Admitting: *Deleted

## 2021-04-11 NOTE — Progress Notes (Signed)
Received second referral for this pt to participate in pulmonary rehab with the diagnosis of Pulmonary Hypertension.  This referral is from Dr. Vaughan Browner and previous referral is from Dr. Haroldine Laws.  Pt was scheduled for pulmonary rehab on 6/17.  Unable to complete this appt - reason unknown.  Clinical review of pt follow up appt on 8/2 Pulmonary office note.  Pt with Covid Risk Score - 3. Pt appropriate for scheduling for Pulmonary rehab.  Pt remains appropriate for rescheduling pulmonary rehab,  will forward this to support staff to follow up with insurance benefits and scheduling. Cherre Huger, BSN Cardiac and Training and development officer

## 2021-04-12 ENCOUNTER — Ambulatory Visit (HOSPITAL_COMMUNITY): Payer: Medicare HMO

## 2021-04-16 ENCOUNTER — Telehealth (HOSPITAL_COMMUNITY): Payer: Self-pay | Admitting: *Deleted

## 2021-04-16 ENCOUNTER — Telehealth (HOSPITAL_COMMUNITY): Payer: Self-pay

## 2021-04-16 NOTE — Telephone Encounter (Signed)
Pt called stating she tested positive for covid her PCP wants to prescribe paxlovid but she takes Xarelto. Per Dr.Bensimhom ok to hold Xarelto 3-5 days while taking paxlovid. I called her pcp and informed them. I also called to inform and pt said she felt really bad and was SOB. Pt said her pulse ox reads 92% at rest. I advised pt if she felt really bad she should call EMS to take her to the emergency room. Pt said she would think about it and let me know.

## 2021-04-16 NOTE — Telephone Encounter (Signed)
Spoke with Mia at [facility] to clarify standing order for port a cath flush for this pt. Need to provide information on how long orders are valid. Message sent to clinical staff for Dustin Folks DNP ordering provider. Clinical staff to call back today or tomorrow to clarify and update order.

## 2021-04-17 ENCOUNTER — Encounter (HOSPITAL_COMMUNITY): Payer: Medicare HMO

## 2021-04-17 ENCOUNTER — Ambulatory Visit (HOSPITAL_COMMUNITY): Payer: Medicare HMO

## 2021-04-19 ENCOUNTER — Ambulatory Visit (HOSPITAL_COMMUNITY): Payer: Medicare HMO

## 2021-04-24 ENCOUNTER — Ambulatory Visit (HOSPITAL_COMMUNITY): Payer: Medicare HMO

## 2021-04-26 ENCOUNTER — Ambulatory Visit (HOSPITAL_COMMUNITY): Payer: Medicare HMO

## 2021-04-27 ENCOUNTER — Other Ambulatory Visit: Payer: Self-pay

## 2021-04-27 ENCOUNTER — Encounter (HOSPITAL_COMMUNITY): Payer: Medicare HMO

## 2021-04-27 ENCOUNTER — Other Ambulatory Visit: Payer: Self-pay | Admitting: Physician Assistant

## 2021-04-27 ENCOUNTER — Ambulatory Visit
Admission: RE | Admit: 2021-04-27 | Discharge: 2021-04-27 | Disposition: A | Discharge status: Home / Self Care | Payer: Medicare HMO | Source: Ambulatory Visit | Attending: Physician Assistant | Admitting: Physician Assistant

## 2021-04-27 DIAGNOSIS — R52 Pain, unspecified: Secondary | ICD-10-CM

## 2021-05-01 ENCOUNTER — Ambulatory Visit: Payer: Medicare HMO | Admitting: Orthopaedic Surgery

## 2021-05-02 ENCOUNTER — Non-Acute Institutional Stay (HOSPITAL_COMMUNITY)
Admission: RE | Admit: 2021-05-02 | Discharge: 2021-05-02 | Disposition: A | Discharge status: Home / Self Care | Payer: Medicare HMO | Source: Ambulatory Visit | Attending: Internal Medicine | Admitting: Internal Medicine

## 2021-05-02 ENCOUNTER — Other Ambulatory Visit: Payer: Self-pay

## 2021-05-02 DIAGNOSIS — Z452 Encounter for adjustment and management of vascular access device: Secondary | ICD-10-CM | POA: Diagnosis present

## 2021-05-02 DIAGNOSIS — Z79899 Other long term (current) drug therapy: Secondary | ICD-10-CM | POA: Insufficient documentation

## 2021-05-02 LAB — CBC WITH DIFFERENTIAL/PLATELET
Abs Immature Granulocytes: 0.04 10*3/uL (ref 0.00–0.07)
Basophils Absolute: 0 10*3/uL (ref 0.0–0.1)
Basophils Relative: 1 %
Eosinophils Absolute: 0.1 10*3/uL (ref 0.0–0.5)
Eosinophils Relative: 1 %
HCT: 38.4 % (ref 36.0–46.0)
Hemoglobin: 12.1 g/dL (ref 12.0–15.0)
Immature Granulocytes: 1 %
Lymphocytes Relative: 34 %
Lymphs Abs: 2.1 10*3/uL (ref 0.7–4.0)
MCH: 22.1 pg — ABNORMAL LOW (ref 26.0–34.0)
MCHC: 31.5 g/dL (ref 30.0–36.0)
MCV: 70.2 fL — ABNORMAL LOW (ref 80.0–100.0)
Monocytes Absolute: 0.4 10*3/uL (ref 0.1–1.0)
Monocytes Relative: 7 %
Neutro Abs: 3.4 10*3/uL (ref 1.7–7.7)
Neutrophils Relative %: 56 %
Platelets: 313 10*3/uL (ref 150–400)
RBC: 5.47 MIL/uL — ABNORMAL HIGH (ref 3.87–5.11)
RDW: 20.2 % — ABNORMAL HIGH (ref 11.5–15.5)
WBC: 6.1 10*3/uL (ref 4.0–10.5)
nRBC: 0 % (ref 0.0–0.2)

## 2021-05-02 LAB — COMPREHENSIVE METABOLIC PANEL
ALT: 12 U/L (ref 0–44)
AST: 13 U/L — ABNORMAL LOW (ref 15–41)
Albumin: 3.6 g/dL (ref 3.5–5.0)
Alkaline Phosphatase: 63 U/L (ref 38–126)
Anion gap: 7 (ref 5–15)
BUN: 9 mg/dL (ref 6–20)
CO2: 29 mmol/L (ref 22–32)
Calcium: 9.4 mg/dL (ref 8.9–10.3)
Chloride: 106 mmol/L (ref 98–111)
Creatinine, Ser: 0.91 mg/dL (ref 0.44–1.00)
GFR, Estimated: >60 mL/min (ref >60)
Glucose, Bld: 113 mg/dL — ABNORMAL HIGH (ref 70–99)
Potassium: 3.4 mmol/L — ABNORMAL LOW (ref 3.5–5.1)
Sodium: 142 mmol/L (ref 135–145)
Total Bilirubin: 0.4 mg/dL (ref 0.3–1.2)
Total Protein: 7.1 g/dL (ref 6.5–8.1)

## 2021-05-02 LAB — TSH: TSH: 1.15 u[IU]/mL (ref 0.350–4.500)

## 2021-05-02 LAB — HEMOGLOBIN A1C
Hgb A1c MFr Bld: 6.4 % — ABNORMAL HIGH (ref 4.8–5.6)
Mean Plasma Glucose: 136.98 mg/dL

## 2021-05-02 LAB — VITAMIN B12: Vitamin B-12: 331 pg/mL (ref 180–914)

## 2021-05-02 LAB — LIPID PANEL
Cholesterol: 115 mg/dL (ref 0–200)
HDL: 35 mg/dL — ABNORMAL LOW (ref >40)
LDL Cholesterol: 67 mg/dL (ref 0–99)
Total CHOL/HDL Ratio: 3.3 RATIO
Triglycerides: 66 mg/dL (ref <150)
VLDL: 13 mg/dL (ref 0–40)

## 2021-05-02 LAB — MAGNESIUM: Magnesium: 1.8 mg/dL (ref 1.7–2.4)

## 2021-05-02 LAB — FOLATE: Folate: 3.6 ng/mL — ABNORMAL LOW (ref >5.9)

## 2021-05-02 MED ORDER — SODIUM CHLORIDE 0.9% FLUSH
10.0000 mL | INTRAVENOUS | Status: AC | PRN
  Administered 2021-05-02: 10 mL

## 2021-05-02 MED ORDER — HEPARIN SOD (PORK) LOCK FLUSH 100 UNIT/ML IV SOLN
500.0000 [IU] | INTRAVENOUS | Status: AC | PRN
  Administered 2021-05-02: 500 [IU]

## 2021-05-02 NOTE — Progress Notes (Signed)
PATIENT CARE CENTER NOTE       Provider: Dustin Folks, DNP     Procedure: Port-a-cath flush and lab draw     Note: Port-a-cath accessed using sterile technique.  Labs drawn from [place] per Cipriano Mile, NP orders. Patient tolerated well. PAC flushed with Heparin and 0.9% NS  and deaccessed. Patient refused AVS. Patient alert, oriented and ambulatory at discharge

## 2021-05-03 LAB — T4, FREE: Free T4: 0.81 ng/dL (ref 0.61–1.12)

## 2021-05-14 ENCOUNTER — Other Ambulatory Visit (HOSPITAL_COMMUNITY): Payer: Self-pay | Admitting: Internal Medicine

## 2021-05-16 ENCOUNTER — Telehealth (HOSPITAL_COMMUNITY): Payer: Self-pay

## 2021-05-16 NOTE — Telephone Encounter (Signed)
Called patient to see if she is interested in the Pulmonary Rehab Program. Patient expressed interest. Explained scheduling process and went over insurance, patient verbalized understanding. Also adv pt where we are with scheduling for PR and that we have a backlog 1-3 months. 

## 2021-05-16 NOTE — Telephone Encounter (Signed)
Pt insurance is active and benefits verified through [facility]. Co-pay $10.00, DED $0.00/$0.00 met, out of pocket $3,900.00/$1,329.45 met, co-insurance 0%. No pre-authorization required. Ken/[employer], 05/16/21 @ 12:07PM, GIT#[id number]   Will contact patient to see if she is interested in the Pulmonary Rehab Program.

## 2021-05-25 ENCOUNTER — Other Ambulatory Visit (HOSPITAL_COMMUNITY): Payer: Self-pay

## 2021-05-27 ENCOUNTER — Ambulatory Visit (HOSPITAL_BASED_OUTPATIENT_CLINIC_OR_DEPARTMENT_OTHER): Payer: Medicare HMO | Attending: Pulmonary Disease | Admitting: Pulmonary Disease

## 2021-05-27 ENCOUNTER — Other Ambulatory Visit: Payer: Self-pay

## 2021-05-27 VITALS — Ht 63.0 in | Wt 332.0 lb

## 2021-05-27 DIAGNOSIS — R0902 Hypoxemia: Secondary | ICD-10-CM | POA: Insufficient documentation

## 2021-05-27 DIAGNOSIS — G4734 Idiopathic sleep related nonobstructive alveolar hypoventilation: Secondary | ICD-10-CM

## 2021-05-27 DIAGNOSIS — Z9989 Dependence on other enabling machines and devices: Secondary | ICD-10-CM | POA: Insufficient documentation

## 2021-05-27 DIAGNOSIS — G4733 Obstructive sleep apnea (adult) (pediatric): Secondary | ICD-10-CM | POA: Diagnosis present

## 2021-05-30 DIAGNOSIS — G4733 Obstructive sleep apnea (adult) (pediatric): Secondary | ICD-10-CM

## 2021-05-30 DIAGNOSIS — Z9989 Dependence on other enabling machines and devices: Secondary | ICD-10-CM | POA: Diagnosis not present

## 2021-05-30 NOTE — Procedures (Signed)
Patient Name: [name] [name], [name] [name] Date: 05/27/2021 Gender: Female D.O.B: [birth year] Age (years): 63 Referring Provider: [name] Height (inches): 30 Interpreting Physician: Chesley Mires MD, ABSM Weight (lbs): 332 RPSGT: Baxter Flattery BMI: 63 MRN: [id number] Neck Size: 18.50  CLINICAL INFORMATION Sleep Study Type: NPSG  Indication for sleep study: She has history of pulmonary hypertension in setting of chronic thromboembolic disease and prior history of obstructive sleep apnea based on sleep study from 2015.  However, home sleep study from March 2022 didn't show significant sleep apnea but there was concern that the home sleep study could underestimate the presence of obstructive sleep apnea.  She is referred to the sleep lab for further assessment.  SLEEP STUDY TECHNIQUE As per the AASM Manual for the Scoring of Sleep and Associated Events v2.3 (April 2016) with a hypopnea requiring 4% desaturations.  The channels recorded and monitored were frontal, central and occipital EEG, electrooculogram (EOG), submentalis EMG (chin), nasal and oral airflow, thoracic and abdominal wall motion, anterior tibialis EMG, snore microphone, electrocardiogram, and pulse oximetry.  MEDICATIONS Medications self-administered by patient taken the night of the study : N/A  SLEEP ARCHITECTURE The study was initiated at 10:23:52 PM and ended at 4:33:26 AM.  Sleep onset time was 48.9 minutes and the sleep efficiency was 82.8%%. The total sleep time was 306.1 minutes.  Stage REM latency was 83.5 minutes.  The patient spent 1.1%% of the night in stage N1 sleep, 72.1%% in stage N2 sleep, 0.0%% in stage N3 and 26.8% in REM.  Alpha intrusion was absent.  Supine sleep was 100.00%.  RESPIRATORY PARAMETERS The overall apnea/hypopnea index (AHI) was 2.0 per hour. There were 5 total apneas, including 5 obstructive, 0 central and 0 mixed apneas. There were 5 hypopneas and 2 RERAs.  The AHI  during Stage REM sleep was 4.4 per hour.  AHI while supine was 2.0 per hour.  The mean oxygen saturation was 95.7%. The minimum SpO2 during sleep was 86.0%.  She spent 29.4 minutes of test time with an SpO2 < 88% in the abscence of other respiratory events.  She then had 2 liters supplemental oxygen applied with improvement in her oxygenation.  loud snoring was noted during this study.  CARDIAC DATA The 2 lead EKG demonstrated sinus rhythm. The mean heart rate was 76.6 beats per minute. Other EKG findings include: None.  LEG MOVEMENT DATA The total PLMS were 0 with a resulting PLMS index of 0.0. Associated arousal with leg movement index was 0.0 .  IMPRESSIONS - While she had several obstructive respiratory events, these were not frequent enough to meet diagnostic criteria for obstructive sleep apnea since her overall AHI was 2 per hour. - Her SpO2 low was 86%.  She spent more than 5 minutes of test time with an SpO2 < 88% in the abscence of other respiratory events.  She had 2 liters supplemental oxygen applied during this study with improvement in her oxygenation.. - The patient snored with loud snoring volume.  DIAGNOSIS - Nocturnal Hypoxemia (G47.36)  RECOMMENDATIONS - Arrange for 2 liters supplemental oxygen at night. - Avoid alcohol, sedatives and other CNS depressants that may worsen sleep apnea and disrupt normal sleep architecture. - Sleep hygiene should be reviewed to assess factors that may improve sleep quality. - Weight management and regular exercise should be initiated or continued if appropriate.  [Electronically signed] 05/30/2021 08:32 AM  Chesley Mires MD, ABSM Diplomate, American Board of Sleep Medicine   NPI: [id number]  [facility] PH: [phone]   FX: [phone] ACCREDITED BY THE AMERICAN ACADEMY OF SLEEP MEDICINE

## 2021-06-05 NOTE — Addendum Note (Signed)
Addended by: Elton Sin on: 06/05/2021 02:44 PM   Modules accepted: Orders

## 2021-06-05 NOTE — Progress Notes (Signed)
Called and spoke with Patient. Dr. Matilde Bash results and recommendations given.  Understanding stated.  O2 order placed. Nothing further at this time.   [name], MD  P Lb[facility] Triage Pool; Elton Sin, LPN Please let patient know that sleep study does not show significant sleep apnea.  However there was low oxygen levels at night  She will need 2 Lt O2 at night.

## 2021-06-13 ENCOUNTER — Other Ambulatory Visit: Payer: Self-pay | Admitting: Family Medicine

## 2021-06-13 ENCOUNTER — Ambulatory Visit
Admission: RE | Admit: 2021-06-13 | Discharge: 2021-06-13 | Disposition: A | Discharge status: Home / Self Care | Payer: Medicare HMO | Source: Ambulatory Visit | Attending: Student | Admitting: Student

## 2021-06-13 ENCOUNTER — Other Ambulatory Visit: Payer: Self-pay | Admitting: Student

## 2021-06-13 ENCOUNTER — Other Ambulatory Visit: Payer: Self-pay

## 2021-06-13 DIAGNOSIS — R079 Chest pain, unspecified: Secondary | ICD-10-CM

## 2021-06-13 DIAGNOSIS — R0989 Other specified symptoms and signs involving the circulatory and respiratory systems: Secondary | ICD-10-CM

## 2021-06-14 ENCOUNTER — Other Ambulatory Visit: Payer: Self-pay | Admitting: Family Medicine

## 2021-06-14 DIAGNOSIS — R2232 Localized swelling, mass and lump, left upper limb: Secondary | ICD-10-CM

## 2021-06-20 ENCOUNTER — Other Ambulatory Visit: Payer: Medicare HMO

## 2021-06-21 ENCOUNTER — Non-Acute Institutional Stay (HOSPITAL_COMMUNITY)
Admission: RE | Admit: 2021-06-21 | Discharge: 2021-06-21 | Disposition: A | Discharge status: Home / Self Care | Payer: Medicare HMO | Source: Ambulatory Visit | Attending: Internal Medicine | Admitting: Internal Medicine

## 2021-06-21 ENCOUNTER — Other Ambulatory Visit: Payer: Self-pay

## 2021-06-21 DIAGNOSIS — Z452 Encounter for adjustment and management of vascular access device: Secondary | ICD-10-CM | POA: Diagnosis not present

## 2021-06-21 MED ORDER — HEPARIN SOD (PORK) LOCK FLUSH 100 UNIT/ML IV SOLN
500.0000 [IU] | INTRAVENOUS | Status: AC | PRN
  Administered 2021-06-21: 500 [IU]

## 2021-06-21 MED ORDER — SODIUM CHLORIDE 0.9% FLUSH
10.0000 mL | INTRAVENOUS | Status: AC | PRN
  Administered 2021-06-21: 10 mL

## 2021-06-21 NOTE — Progress Notes (Signed)
PATIENT CARE CENTER NOTE       Provider: Dustin Folks, DNP     Procedure: Port-a-cath flush     Note: Port-a-cath accessed using sterile technique. Patient tolerated well. PAC flushed with Heparin and 0.9% NS  and deaccessed. Band-aid placed over site. Patient to come monthly for flushes. Patient alert, oriented and ambulatory at discharge

## 2021-06-23 ENCOUNTER — Other Ambulatory Visit (HOSPITAL_COMMUNITY): Payer: Self-pay | Admitting: Internal Medicine

## 2021-06-25 ENCOUNTER — Other Ambulatory Visit: Payer: Medicare HMO

## 2021-06-26 ENCOUNTER — Ambulatory Visit
Admission: RE | Admit: 2021-06-26 | Discharge: 2021-06-26 | Disposition: A | Discharge status: Home / Self Care | Payer: Medicare HMO | Source: Ambulatory Visit | Attending: Family Medicine | Admitting: Family Medicine

## 2021-06-26 ENCOUNTER — Other Ambulatory Visit: Payer: Self-pay | Admitting: Family Medicine

## 2021-06-26 DIAGNOSIS — R2232 Localized swelling, mass and lump, left upper limb: Secondary | ICD-10-CM

## 2021-06-30 ENCOUNTER — Other Ambulatory Visit (HOSPITAL_COMMUNITY): Payer: Self-pay | Admitting: Internal Medicine

## 2021-07-12 ENCOUNTER — Telehealth (HOSPITAL_COMMUNITY): Payer: Self-pay

## 2021-07-12 NOTE — Telephone Encounter (Signed)
Pt is unable to do pulmonary rehab right now. Closed referral.

## 2021-07-20 ENCOUNTER — Encounter (HOSPITAL_COMMUNITY): Payer: Medicare HMO

## 2021-07-29 ENCOUNTER — Other Ambulatory Visit (HOSPITAL_COMMUNITY): Payer: Self-pay | Admitting: Internal Medicine

## 2021-08-07 ENCOUNTER — Other Ambulatory Visit: Payer: Self-pay

## 2021-08-07 ENCOUNTER — Other Ambulatory Visit: Payer: Self-pay | Admitting: Student

## 2021-08-07 DIAGNOSIS — Z1231 Encounter for screening mammogram for malignant neoplasm of breast: Secondary | ICD-10-CM

## 2021-08-15 ENCOUNTER — Non-Acute Institutional Stay (HOSPITAL_COMMUNITY)
Admission: RE | Admit: 2021-08-15 | Discharge: 2021-08-15 | Disposition: A | Discharge status: Home / Self Care | Payer: Medicare HMO | Source: Ambulatory Visit | Attending: Internal Medicine | Admitting: Internal Medicine

## 2021-08-15 ENCOUNTER — Other Ambulatory Visit: Payer: Self-pay

## 2021-08-15 DIAGNOSIS — Z452 Encounter for adjustment and management of vascular access device: Secondary | ICD-10-CM | POA: Insufficient documentation

## 2021-08-15 MED ORDER — HEPARIN SOD (PORK) LOCK FLUSH 100 UNIT/ML IV SOLN
500.0000 [IU] | INTRAVENOUS | Status: AC | PRN
  Administered 2021-08-15: 500 [IU]

## 2021-08-15 MED ORDER — SODIUM CHLORIDE 0.9% FLUSH
10.0000 mL | INTRAVENOUS | Status: AC | PRN
  Administered 2021-08-15: 10 mL

## 2021-08-15 NOTE — Progress Notes (Signed)
PATIENT CARE CENTER NOTE       Provider: Dustin Folks, DNP     Procedure: Port-a-cath flush     Note: Port-a-cath accessed using sterile technique. Patient tolerated well. PAC flushed with Heparin and 0.9% NS  and deaccessed. Band-aid placed over site. Patient to come monthly for flushes. Patient alert, oriented and ambulatory at discharge.

## 2021-08-28 ENCOUNTER — Other Ambulatory Visit (HOSPITAL_COMMUNITY): Payer: Self-pay | Admitting: *Deleted

## 2021-08-28 MED ORDER — RIVAROXABAN 20 MG PO TABS: 20.0000 mg | ORAL_TABLET | Freq: Every day | ORAL | 1 refills | Status: DC

## 2021-08-31 ENCOUNTER — Telehealth (HOSPITAL_COMMUNITY): Payer: Self-pay

## 2021-08-31 NOTE — Telephone Encounter (Signed)
Received a fax requesting medical records from [employer]. Records were successfully faxed to: [phone] ,which was the number provided.. Medical request form will be scanned into patients chart.

## 2021-09-12 ENCOUNTER — Ambulatory Visit
Admission: RE | Admit: 2021-09-12 | Discharge: 2021-09-12 | Disposition: A | Discharge status: Home / Self Care | Payer: Medicare HMO | Source: Ambulatory Visit

## 2021-09-12 DIAGNOSIS — Z1231 Encounter for screening mammogram for malignant neoplasm of breast: Secondary | ICD-10-CM

## 2021-09-14 ENCOUNTER — Encounter (HOSPITAL_COMMUNITY): Payer: Medicare HMO

## 2021-09-19 ENCOUNTER — Telehealth: Payer: Self-pay | Admitting: Pulmonary Disease

## 2021-09-19 ENCOUNTER — Telehealth (HOSPITAL_COMMUNITY): Payer: Self-pay | Admitting: *Deleted

## 2021-09-19 DIAGNOSIS — I272 Pulmonary hypertension, unspecified: Secondary | ICD-10-CM

## 2021-09-19 NOTE — Telephone Encounter (Signed)
Returned call to pt from message left on departmental voicemail. Pt would like to schedule pulmonary rehab.  Back is doing better and she feels she can participate in group ex.  Will re-open this referral and add pt to wait list. Maurice Small RN, BSN Cardiac and Pulmonary Rehab Nurse Navigator

## 2021-09-19 NOTE — Telephone Encounter (Signed)
Attempted to contact patient x3, phone line just rings busy.   PM please advise if okay to send order for cardiac/pulmonary rehab. Thanks

## 2021-09-20 NOTE — Telephone Encounter (Signed)
Okay to send in order for cardio pulmonary rehab

## 2021-09-20 NOTE — Telephone Encounter (Signed)
Rehab referral was placed  Left detailed msg letting her know this was done ok per [facility]

## 2021-09-28 ENCOUNTER — Encounter (HOSPITAL_COMMUNITY): Payer: Medicare HMO

## 2021-10-05 ENCOUNTER — Encounter (HOSPITAL_COMMUNITY): Payer: Medicare HMO

## 2021-10-11 ENCOUNTER — Telehealth (HOSPITAL_COMMUNITY): Payer: Self-pay | Admitting: Pharmacy Technician

## 2021-10-11 NOTE — Telephone Encounter (Signed)
Advanced Heart Failure Patient Advocate Encounter  Received a provider portion of a bayer adempas assistance application. Sent to [employer] via fax. Called and spoke with the patient. She has her portion but has not sent it in. I encouraged her to bring it to the office, but she stated that she received the application with a priority envelope and will send it back to them.  Will follow up

## 2021-10-12 ENCOUNTER — Telehealth (HOSPITAL_COMMUNITY): Payer: Self-pay

## 2021-10-12 NOTE — Telephone Encounter (Signed)
Returned pt phone call and adv her we have recv'ed her PR referral. And that we will contact her at a later pt for scheduling and PR backlog.

## 2021-10-18 ENCOUNTER — Other Ambulatory Visit (HOSPITAL_COMMUNITY): Payer: Self-pay | Admitting: Internal Medicine

## 2021-10-18 ENCOUNTER — Ambulatory Visit (HOSPITAL_COMMUNITY)
Admission: RE | Admit: 2021-10-18 | Discharge: 2021-10-18 | Disposition: A | Discharge status: Home / Self Care | Payer: Medicare HMO | Source: Ambulatory Visit | Attending: Internal Medicine | Admitting: Internal Medicine

## 2021-10-18 ENCOUNTER — Other Ambulatory Visit: Payer: Self-pay

## 2021-10-18 ENCOUNTER — Encounter (HOSPITAL_COMMUNITY): Payer: Self-pay | Admitting: Internal Medicine

## 2021-10-18 VITALS — BP 90/58 | HR 97 | Wt 322.2 lb

## 2021-10-18 DIAGNOSIS — G4733 Obstructive sleep apnea (adult) (pediatric): Secondary | ICD-10-CM | POA: Diagnosis not present

## 2021-10-18 DIAGNOSIS — M797 Fibromyalgia: Secondary | ICD-10-CM | POA: Insufficient documentation

## 2021-10-18 DIAGNOSIS — I2721 Secondary pulmonary arterial hypertension: Secondary | ICD-10-CM | POA: Diagnosis not present

## 2021-10-18 DIAGNOSIS — Z6841 Body Mass Index (BMI) 40.0 and over, adult: Secondary | ICD-10-CM | POA: Diagnosis not present

## 2021-10-18 DIAGNOSIS — R079 Chest pain, unspecified: Secondary | ICD-10-CM | POA: Diagnosis not present

## 2021-10-18 DIAGNOSIS — Z9989 Dependence on other enabling machines and devices: Secondary | ICD-10-CM | POA: Insufficient documentation

## 2021-10-18 DIAGNOSIS — M069 Rheumatoid arthritis, unspecified: Secondary | ICD-10-CM | POA: Diagnosis not present

## 2021-10-18 DIAGNOSIS — F419 Anxiety disorder, unspecified: Secondary | ICD-10-CM | POA: Insufficient documentation

## 2021-10-18 DIAGNOSIS — I2782 Chronic pulmonary embolism: Secondary | ICD-10-CM | POA: Insufficient documentation

## 2021-10-18 DIAGNOSIS — I2724 Chronic thromboembolic pulmonary hypertension: Secondary | ICD-10-CM | POA: Diagnosis not present

## 2021-10-18 DIAGNOSIS — I493 Ventricular premature depolarization: Secondary | ICD-10-CM

## 2021-10-18 DIAGNOSIS — I272 Pulmonary hypertension, unspecified: Secondary | ICD-10-CM | POA: Diagnosis not present

## 2021-10-18 DIAGNOSIS — R002 Palpitations: Secondary | ICD-10-CM | POA: Insufficient documentation

## 2021-10-18 DIAGNOSIS — F32A Depression, unspecified: Secondary | ICD-10-CM | POA: Diagnosis not present

## 2021-10-18 DIAGNOSIS — E662 Morbid (severe) obesity with alveolar hypoventilation: Secondary | ICD-10-CM | POA: Insufficient documentation

## 2021-10-18 DIAGNOSIS — I5030 Unspecified diastolic (congestive) heart failure: Secondary | ICD-10-CM

## 2021-10-18 DIAGNOSIS — Z7901 Long term (current) use of anticoagulants: Secondary | ICD-10-CM | POA: Insufficient documentation

## 2021-10-18 DIAGNOSIS — G8929 Other chronic pain: Secondary | ICD-10-CM | POA: Insufficient documentation

## 2021-10-18 DIAGNOSIS — I110 Hypertensive heart disease with heart failure: Secondary | ICD-10-CM | POA: Insufficient documentation

## 2021-10-18 DIAGNOSIS — Z79899 Other long term (current) drug therapy: Secondary | ICD-10-CM | POA: Insufficient documentation

## 2021-10-18 NOTE — Addendum Note (Signed)
Encounter addended by: Jerl Mina, RN on: 10/18/2021 3:56 PM  Actions taken: Clinical Note Signed

## 2021-10-18 NOTE — Progress Notes (Signed)
Zio patch placed onto patient.  All instructions and information reviewed with patient, they verbalize understanding with no questions. 

## 2021-10-18 NOTE — Patient Instructions (Addendum)
Thank you for your visit today.  There has been no changes to your medications.  Your physician has requested that you have an echocardiogram. Echocardiography is a painless test that uses sound waves to create images of your heart. It provides your doctor with information about the size and shape of your heart and how well your hearts chambers and valves are working. This procedure takes approximately one hour. There are no restrictions for this procedure.   Your provider has recommended that  you wear a Zio Patch for 14 days.  This monitor will record your heart rhythm for our review.  IF you have any symptoms while wearing the monitor please press the button.  If you have any issues with the patch or you notice a red or orange light on it please call the company at [phone].  Once you remove the patch please mail it back to the company as soon as possible so we can get the results.   Your physician recommends that you schedule a follow-up appointment in: 9 months.  If you have any questions or concerns before your next appointment please send [place] a message through [place] or call our office at [phone].    TO LEAVE A MESSAGE FOR THE NURSE SELECT OPTION 2, PLEASE LEAVE A MESSAGE INCLUDING: YOUR NAME DATE OF BIRTH CALL BACK NUMBER REASON FOR CALL**this is important as we prioritize the call backs  YOU WILL RECEIVE A CALL BACK THE SAME DAY AS LONG AS YOU CALL BEFORE 4:00 PM  At the [facility], you and your health needs are our priority. As part of our continuing mission to provide you with exceptional heart care, we have created designated Provider Care Teams. These Care Teams include your primary Cardiologist (physician) and Advanced Practice Providers (APPs- Physician Assistants and Nurse Practitioners) who all work together to provide you with the care you need, when you need it.   You may see any of the following providers on your designated Care Team at your  next follow up: Dr Glori Bickers Dr Haynes Kerns, NP Lyda Jester, [place] [facility] [place], [place] Audry Riles, PharmD   Please be sure to bring in all your medications bottles to every appointment.  `

## 2021-10-18 NOTE — Progress Notes (Signed)
Pulmonary Hypertension Clinic Note  Date:  10/18/2021   ID:  [name], [name] 1961/04/02, MRN [id number]  PCP:  Ruthine Dose, MD  HF MD: Dr Haroldine Laws    HPI: [name] [name] is a 61 y.o. 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   In [place]  in 09/2015 diagnosed with saddle PE and started on anticoagulation. She was very briefly on warfarin but quickly switched to Xarelto.   She was referred to [facility] in 4/17. Echocardiogram showed normal LVEF 55-60%, normal LA, normal RV size, RVSP estimated as 70 mm Hg. She further was referred to [facility] and saw Dr. Karena Addison on 03/14/2016 where patient had several studies including V/Q scan which found high prob for PE; studies c/w CTEPH. It was felt that her chronic chest pain was caused by chronic PE. RHC/LHC/Pulm Angiogram 03/2016 confirmed diagnosis of CTEPH. She underwent pulmonary thromboembolectomy 03/31/2016 at [facility] with IVC filter placement. Pre-op cath with no CAD.   Seen by Dr. Karena Addison in 04/2016 for follow up. He felt like her heart size was decreasing. Plan was for lifelong [place] and ECHO and V/Q in 4 months.  CT angio at [facility] on 05/15/16 showed " persistent but partially recanalized PE within right lower lobe pulmonary artery. No new PE identified. "  She was admitted to [facility] in 06/2016 for continued chest pain and parasthesias. V/Q scan on 06/27/16 showed prominent ventilation perfusion mismatch in right c/w chronic PE. Head CT negative. She was continued on Xarelto.  Admitted to [facility] for suicidal ideation. She was then sent to [facility] for additional treatment.   Had repeat RHC in 10/20 which showed mild PAH (50/16 (28)) with high output (8.1l/min) and normal PVR (2.0 WU). Not candidate for additional selective pulmonary artery vasodialtors    Sleep study 9/22: AHI 4.4  In 11/21 had worsening symptoms. RHC unchanged PA 52/18 (30). PVR 2.8  Remains on Adempas 2 mg tid.   Here for f/u. Feels ok. Mother, father and sister died over the last few months. Under a lot of stress. Having frequent skipped beats. SOB with mild exertion. Minimal edema. No orthopnea or PND. Off CPAP. Wearing O2 at night.   [place] 11/21  RA = 8 RV = 49/10 PA = 52/18 (30) PCW = 12 Fick cardiac output/index = 6.5/2.7 PVR = 2.8 WU FA sat = 98% PA sat = 67%, 68% PaPi = 3.6  RHC 10/20 RA = 7 RV = 50/12 PA = 50/16 (28) PCW = 11 Fick cardiac output/index = 8.1/3.4 PVR = 2.0 WU FA sat = 99% PA sat = 72%, 77%  10/2016 RHC RA = 8 RV = 64/13 PA = 65/19 (35) PCW = 9 Fick cardiac output/index = 7.5/3.1 PVR = 3.5 WU Ao sat = 96% PA sat = 68%,70% SVC sat = 71%  Assessment:  1. Mild residual PAH in the setting of CTEPH s/p pulmonary thrombolectomy 2. Normal cardiac output 3. Normal left-sided filling pressures   Past Medical History:  Diagnosis Date   Anxiety    Arthritis    CHF exacerbation ([place]) 08/21/2017   Depression    Depression    Difficult intubation    See [facility] anesthesia records in Care Everywhere   Diverticulitis    Dyspnea    with exertion   Family history of adverse reaction to  anesthesia    " My mother stopped breathing during procedure"   GERD (gastroesophageal reflux disease)    PMH; resolved   Hyperlipidemia    Hypertension    Obesity    Plantar fasciitis, left    Pneumonia    Port-A-Cath in place    Pre-diabetes    Pulmonary embolism ([place])    Pulmonary hypertension ([place])    Sleep apnea    wears CPAP # 9   Wears glasses     Past Surgical History:  Procedure Laterality Date   ABDOMINAL HYSTERECTOMY     BIOPSY  02/26/2020   Procedure: BIOPSY;  Surgeon: Ronnette Juniper, MD;  Location[facility] ENDOSCOPY;  Service: Gastroenterology;;   BREAST REDUCTION SURGERY     CHOLECYSTECTOMY     COLONOSCOPY W/ BIOPSIES AND  POLYPECTOMY     EMBOLECTOMY N/A    pulmonary embolectomy   ESOPHAGOGASTRODUODENOSCOPY N/A 02/26/2020   Procedure: ESOPHAGOGASTRODUODENOSCOPY (EGD);  Surgeon: Ronnette Juniper, MD;  Location: [place];  Service: Gastroenterology;  Laterality: N/A;   FRACTURE SURGERY     right ankle   GASTROCNEMIUS RECESSION Left 02/12/2019   Procedure: LEFT GASTROCNEMIUS RECESSION, PLANTAR FASCIA RELEASE;  Surgeon: Newt Minion, MD;  Location: [place];  Service: Orthopedics;  Laterality: Left;   IVC FILTER INSERTION     REDUCTION MAMMAPLASTY Bilateral    RIGHT HEART CATH N/A 10/28/2016   Procedure: Right Heart Cath;  Surgeon: Jolaine Artist, MD;  Location: [place] CV LAB;  Service: Cardiovascular;  Laterality: N/A;   RIGHT HEART CATH N/A 08/26/2017   Procedure: RIGHT HEART CATH;  Surgeon: Jolaine Artist, MD;  Location: [place] CV LAB;  Service: Cardiovascular;  Laterality: N/A;   RIGHT HEART CATH N/A 07/05/2019   Procedure: RIGHT HEART CATH;  Surgeon: Jolaine Artist, MD;  Location: [place] CV LAB;  Service: Cardiovascular;  Laterality: N/A;   RIGHT HEART CATH N/A 07/24/2020   Procedure: RIGHT HEART CATH;  Surgeon: Jolaine Artist, MD;  Location: [place] CV LAB;  Service: Cardiovascular;  Laterality: N/A;   ULTRASOUND GUIDANCE FOR VASCULAR ACCESS  08/26/2017   Procedure: Ultrasound Guidance For Vascular Access;  Surgeon: Jolaine Artist, MD;  Location: [place] CV LAB;  Service: Cardiovascular;;   WISDOM TOOTH EXTRACTION      Current Medications:  Current Outpatient Medications on File Prior to Encounter  Medication Sig Dispense Refill   albuterol (VENTOLIN HFA) 108 (90 Base) MCG/ACT inhaler Inhale 2 puffs into the lungs every 6 (six) hours as needed for wheezing or shortness of breath.     atorvastatin (LIPITOR) 20 MG tablet Take 1 tablet (20 mg total) by mouth daily at 6 PM. 90 tablet 1   docusate sodium (COLACE) 100 MG capsule Take 1 capsule (100 mg total) by mouth  every 12 (twelve) hours. 30 capsule 0   furosemide (LASIX) 40 MG tablet Take 1 tablet (40 mg total) by mouth daily. Take extra 40 mg tablet once in the afternoon AS NEEDED for weight gain 3 lbs or more. 180 tablet 1   gabapentin (NEURONTIN) 300 MG capsule Take 300 mg by mouth 2 (two) times daily.     HYDROcodone-acetaminophen (NORCO) 10-325 MG tablet TAKE 1 TABLET BY MOUTH UP TO THREE TIMES DAILY AS NEEDED     LORazepam (ATIVAN) 0.5 MG tablet Take 0.5 mg by mouth at bedtime as needed for anxiety.     LORazepam (ATIVAN) 1 MG tablet Take by mouth.     meclizine (ANTIVERT) 25  MG tablet Take 25 mg by mouth 3 (three) times daily as needed for dizziness.     montelukast (SINGULAIR) 10 MG tablet Take 10 mg by mouth every evening.     omeprazole (PRILOSEC) 40 MG capsule Take 40 mg by mouth 2 (two) times daily.     ondansetron (ZOFRAN) 4 MG tablet Take 4 mg by mouth every 8 (eight) hours as needed for nausea or vomiting.     polyethylene glycol powder (GLYCOLAX/MIRALAX) powder Take 17 g by mouth 2 (two) times daily as needed. 3350 g 1   QUEtiapine (SEROQUEL) 50 MG tablet Take 50-100 mg by mouth at bedtime as needed (sleep).      Riociguat (ADEMPAS) 2 MG TABS Take 2 mg by mouth 3 (three) times daily. 180 tablet 3   rivaroxaban (XARELTO) 20 MG TABS tablet Take 1 tablet (20 mg total) by mouth daily with supper. 30 tablet 1   spironolactone (ALDACTONE) 25 MG tablet Take 25 mg by mouth every other day.     temazepam (RESTORIL) 30 MG capsule Take 30-60 mg by mouth at bedtime as needed for sleep.      No current facility-administered medications on file prior to encounter.    Allergies:   [name].   Social History   Socioeconomic History   Marital status: Married    Spouse name: Not on file   Number of children: Not on file   Years of education: Not on file   Highest education level: Not on file  Occupational History   Not on file  Tobacco Use   Smoking status: Never    Smokeless tobacco: Never  Vaping Use   Vaping Use: Never used  Substance and Sexual Activity   Alcohol use: No   Drug use: No   Sexual activity: Yes    Partners: Male    Birth control/protection: Surgical  Other Topics Concern   Not on file  Social History Narrative   Not on file   Social Determinants of Health   Financial Resource Strain: Not on file  Food Insecurity: Not on file  Transportation Needs: Not on file  Physical Activity: Not on file  Stress: Not on file  Social Connections: Not on file    Family History:  The patient's family history includes Anxiety disorder in her cousin, sister, and sister; Bipolar disorder in her sister and sister; Dementia in her father and mother; Drug abuse in her sister and sister; Lung cancer in her maternal grandmother; Sexual abuse in her maternal aunt.     Review of systems complete and found to be negative unless listed in HPI.   PHYSICAL EXAM:    Vitals:   10/18/21 1458  Weight: (!) 146.1 kg (322 lb 3.2 oz)    Wt Readings from Last 3 Encounters:  10/18/21 (!) 146.1 kg (322 lb 3.2 oz)  05/27/21 (!) 150.6 kg (332 lb)  04/10/21 (!) 150.8 kg (332 lb 6.4 oz)    General:  Obese woman No resp difficulty HEENT: normal Neck: supple. no JVD. Carotids 2+ bilat; no bruits. No lymphadenopathy or thryomegaly appreciated. Cor: PMI nondisplaced. Regular rate & rhythm. No rubs, gallops or murmurs. Lungs: clear Abdomen: severely obese soft, nontender, nondistended. No hepatosplenomegaly. No bruits or masses. Good bowel sounds. Extremities: no cyanosis, clubbing, rash, edema Neuro: alert & orientedx3, cranial nerves grossly intact. moves all 4 extremities w/o difficulty. Affect pleasant  Echo NSR 99 RBBB Personally reviewed    Studies/Labs Reviewed:   Recent  Labs: 05/02/2021: ALT 12; BUN 9; Creatinine, Ser 0.91; Hemoglobin 12.1; Magnesium 1.8; Platelets 313; Potassium 3.4; Sodium 142; TSH 1.150   Lipid Panel    Component Value  Date/Time   CHOL 115 05/02/2021 1144   TRIG 66 05/02/2021 1144   HDL 35 (L) 05/02/2021 1144   CHOLHDL 3.3 05/02/2021 1144   VLDL 13 05/02/2021 1144   LDLCALC 67 05/02/2021 1144    Additional studies/ records that were reviewed today include:   ECHO 8/18 EF 55-60%  RV normal RVSP 24mmHG   2D ECHO: 12/15/2015 LV EF: 55% -   60% Study Conclusions - Left ventricle: The cavity size was normal. Wall thickness was normal. Systolic function was normal. The estimated ejection  fraction was in the range of 55% to 60%. - Pulmonary arteries: PA peak pressure: 70 mm Hg (S). - Pericardium, extracardiac: A trivial pericardial effusion was   identified.    2D ECHO 04/05/16 INTERPRETATION: NORMAL LEFT VENTRICULAR FUNCTION WITH MILD LVH SEVERE RV SYSTOLIC DYSFUNCTION (See above) VALVULAR REGURGITATION: TRIVIAL PR, MILD TR NO VALVULAR STENOSIS TRIVIAL PERICARDIAL EFFUSION Compared with prior Echo study on 03/14/2016: RVSP increased from 50 to 69   CATH right heart and coronary angiography: 03/25/2016 [place] Component Name Value Ref Range  Cardiac Index (l/min/m2) 2.3 L/min/m2  Right Atrium Mean Pressure (mmHg) 11 mmHg   Right Ventricle Systolic Pressure (mmHg) 68 mmHg   Pulmonary Artery Mean Pressure (mmHg) 40 mmHg   Pulmonary Wedge Pressure (mmHg) 14 mmHg   Pulmonary Vascular Resistance (Wood units) 4.7    CT angio 05/15/16 IMPRESSION: Persistent but partially recanalized pulmonary embolus within the right lower lobe pulmonary artery. No new pulmonary embolism is identified. Minimal bibasilar atelectatic changes stable from the prior exam.     V/Q scan 06/27/16 IMPRESSION: Prominent ventilation perfusion mismatch noted on the right. Finding suggests high probability right-sided pulmonary embolus in this patient with known right-sided pulmonary embolus.    ASSESSMENT & PLAN:    1. 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 No bleeding  - RHC 12/18 with very mild PAH and PVR 3.1 WU - Echo 10/19 shows EF 55-60% RV not well seen but looks ok No septal flattening  - Echo 9/20 LVEF 65% RV dilated with normal function. Septum flat. Personally reviewed - RHC 10/20 mild PAH (50/16/28) with high output (8.1l/min) and normal PVR (2.0 WU). Not candidate for additional selective pulmonary artery vasodilators. - RHC 11/21 PA 52/18 (30) PCW 12 PVR 2.8 WU - NYHA III-III confounded by obesity - Continue adempas at 2mg  tid. BP too low to add PDE-5 - Continue Xarelto  - Volume status looks ok. On lasix 40 daily and spiro 12.5  - Has referral to Pulmonary Rehab - repeat echo  2. Chronic chest pain:  - Has chronic PE s/p thrombectomy, LHC [facility] 7/17 with no CAD.  - Resolved  3. Palpitations - suspect frequent PVCs - place ZIO  4. Fibromyalgia/Rheumatoid Arthitis:  - Follows with Rheumatology. No change.    5. Morbid obesity with OHS:  - Body mass index is 57.08 kg/m.  - Now starting Ozempic  6. OSA - Followed by Dr. Halford Chessman - now off CPAP. Wearing O2 at night - Recent AHI 4.4   7. Depression/anxiety - Receiving outpatient therapy.   - Per PCP  Glori Bickers, MD  3:02 PM

## 2021-10-18 NOTE — Addendum Note (Signed)
Encounter addended by: Jerl Mina, RN on: 10/18/2021 3:52 PM  Actions taken: Visit diagnoses modified, Order list changed, Diagnosis association updated, Clinical Note Signed

## 2021-10-18 NOTE — Progress Notes (Signed)
Medication Samples have been provided to the patient.  Drug name: Xarelto       Strength: 20mg         Qty: 4 bottles  LOT: [id number]  Exp.Date: 10/24- 03/25  Dosing instructions: Take 1 tablet daily  The patient has been instructed regarding the correct time, dose, and frequency of taking this medication, including desired effects and most common side effects.   Devonna Oboyle M Nialah Saravia 3:54 PM 10/18/2021

## 2021-10-29 ENCOUNTER — Telehealth (HOSPITAL_COMMUNITY): Payer: Self-pay

## 2021-10-29 NOTE — Telephone Encounter (Signed)
Pt insurance is active and benefits verified through [place] $10, DED 0/0 met, out of pocket $3,400/$54.05 met, co-insurance 0%. no pre-authorization required. [facility] 10/29/2021_0 :14am, REF# [id number]   Will contact patient to see if she is interested in the Cardiac Rehab Program. If interested, patient will need to complete follow up appt. Once completed, patient will be contacted for scheduling upon review by the RN Navigator.

## 2021-10-30 NOTE — Telephone Encounter (Signed)
Advanced Heart Failure Patient Advocate Encounter   Patient was approved to receive Adempas from [employer]  Patient ID: [id number] Effective dates: 10/30/21 through 09/08/22  Called and left the patient a message regarding renewal approval.  Charlann Boxer, CPhT

## 2021-11-06 ENCOUNTER — Telehealth (HOSPITAL_COMMUNITY): Payer: Self-pay | Admitting: *Deleted

## 2021-11-06 ENCOUNTER — Telehealth (HOSPITAL_COMMUNITY): Payer: Self-pay

## 2021-11-06 NOTE — Telephone Encounter (Signed)
Pt left vm requesting monitor results   Routed to [place]

## 2021-11-06 NOTE — Telephone Encounter (Signed)
Spoke with Wells Guiles , clinical staff,  at [facility], informed her that orders for port a cath access and flush are expired. Reviewed what needs to be included in the new orders and provided with fax number to the [facility]. Explained that pt is scheduled for 11/07/2021 and will have to receive orders before pt can be treated, verbalized understanding.

## 2021-11-07 ENCOUNTER — Non-Acute Institutional Stay (HOSPITAL_COMMUNITY)
Admission: RE | Admit: 2021-11-07 | Discharge: 2021-11-07 | Disposition: A | Discharge status: Home / Self Care | Payer: Medicare HMO | Source: Ambulatory Visit | Attending: Internal Medicine | Admitting: Internal Medicine

## 2021-11-07 ENCOUNTER — Other Ambulatory Visit: Payer: Self-pay

## 2021-11-07 DIAGNOSIS — Z452 Encounter for adjustment and management of vascular access device: Secondary | ICD-10-CM | POA: Diagnosis present

## 2021-11-07 DIAGNOSIS — Z438 Encounter for attention to other artificial openings: Secondary | ICD-10-CM | POA: Insufficient documentation

## 2021-11-07 DIAGNOSIS — Z79899 Other long term (current) drug therapy: Secondary | ICD-10-CM | POA: Diagnosis not present

## 2021-11-07 LAB — COMPREHENSIVE METABOLIC PANEL
ALT: 12 U/L (ref 0–44)
AST: 14 U/L — ABNORMAL LOW (ref 15–41)
Albumin: 3.6 g/dL (ref 3.5–5.0)
Alkaline Phosphatase: 79 U/L (ref 38–126)
Anion gap: 7 (ref 5–15)
BUN: 13 mg/dL (ref 6–20)
CO2: 28 mmol/L (ref 22–32)
Calcium: 9.3 mg/dL (ref 8.9–10.3)
Chloride: 105 mmol/L (ref 98–111)
Creatinine, Ser: 0.92 mg/dL (ref 0.44–1.00)
GFR, Estimated: >60 mL/min (ref >60)
Glucose, Bld: 99 mg/dL (ref 70–99)
Potassium: 3.2 mmol/L — ABNORMAL LOW (ref 3.5–5.1)
Sodium: 140 mmol/L (ref 135–145)
Total Bilirubin: 0.3 mg/dL (ref 0.3–1.2)
Total Protein: 7.6 g/dL (ref 6.5–8.1)

## 2021-11-07 LAB — LIPID PANEL
Cholesterol: 102 mg/dL (ref 0–200)
HDL: 28 mg/dL — ABNORMAL LOW (ref >40)
LDL Cholesterol: 61 mg/dL (ref 0–99)
Total CHOL/HDL Ratio: 3.6 RATIO
Triglycerides: 66 mg/dL (ref <150)
VLDL: 13 mg/dL (ref 0–40)

## 2021-11-07 LAB — CBC WITH DIFFERENTIAL/PLATELET
Abs Immature Granulocytes: 0.05 10*3/uL (ref 0.00–0.07)
Basophils Absolute: 0 10*3/uL (ref 0.0–0.1)
Basophils Relative: 1 %
Eosinophils Absolute: 0.1 10*3/uL (ref 0.0–0.5)
Eosinophils Relative: 2 %
HCT: 36.6 % (ref 36.0–46.0)
Hemoglobin: 11.8 g/dL — ABNORMAL LOW (ref 12.0–15.0)
Immature Granulocytes: 1 %
Lymphocytes Relative: 29 %
Lymphs Abs: 2.2 10*3/uL (ref 0.7–4.0)
MCH: 22.1 pg — ABNORMAL LOW (ref 26.0–34.0)
MCHC: 32.2 g/dL (ref 30.0–36.0)
MCV: 68.5 fL — ABNORMAL LOW (ref 80.0–100.0)
Monocytes Absolute: 0.5 10*3/uL (ref 0.1–1.0)
Monocytes Relative: 7 %
Neutro Abs: 4.6 10*3/uL (ref 1.7–7.7)
Neutrophils Relative %: 60 %
Platelets: 355 10*3/uL (ref 150–400)
RBC: 5.34 MIL/uL — ABNORMAL HIGH (ref 3.87–5.11)
RDW: 16.9 % — ABNORMAL HIGH (ref 11.5–15.5)
WBC: 7.5 10*3/uL (ref 4.0–10.5)
nRBC: 0 % (ref 0.0–0.2)

## 2021-11-07 LAB — TSH: TSH: 0.405 u[IU]/mL (ref 0.350–4.500)

## 2021-11-07 MED ORDER — SODIUM CHLORIDE 0.9% FLUSH
10.0000 mL | INTRAVENOUS | Status: AC | PRN
  Administered 2021-11-07: 10 mL

## 2021-11-07 MED ORDER — HEPARIN SOD (PORK) LOCK FLUSH 100 UNIT/ML IV SOLN
500.0000 [IU] | INTRAVENOUS | Status: AC | PRN
  Administered 2021-11-07: 500 [IU]

## 2021-11-07 NOTE — Progress Notes (Signed)
?  Provider: Cipriano Mile, NP ?  ?  ?Procedure: Port-a-cath flush + CBC w diff, CMP, Lipid Panel, TSH w reflex, PTH w Ca+, UMA w/ creatinine.  ?  ?  ?Note: Patient's PAC accessed, flushed with 10 cc 0.9% sodium chloride and heparin. Blood return noted. Labs collected per orders. PAC de-accessed and site covered with band aid. Patient tolerated well. Patient given printed AVS. Instructed pt to schedule monthly port flush at front desk prior to leaving, verbalized understanding.  Alert, oriented and ambulatory at discharge.  ?

## 2021-11-08 LAB — MICROALBUMIN, URINE: Microalb, Ur: 5.9 ug/mL — ABNORMAL HIGH

## 2021-11-12 LAB — PTH, INTACT AND CALCIUM: PTH: 41 pg/mL (ref 15–65)

## 2021-11-15 ENCOUNTER — Ambulatory Visit: Payer: Medicare HMO | Admitting: Orthopaedic Surgery

## 2021-11-27 ENCOUNTER — Telehealth (HOSPITAL_COMMUNITY): Payer: Self-pay | Admitting: *Deleted

## 2021-11-28 ENCOUNTER — Encounter (HOSPITAL_COMMUNITY)
Admission: RE | Admit: 2021-11-28 | Discharge: 2021-11-28 | Disposition: A | Discharge status: Home / Self Care | Payer: Medicare HMO | Source: Ambulatory Visit | Attending: Pulmonary Disease | Admitting: Pulmonary Disease

## 2021-11-28 ENCOUNTER — Encounter (HOSPITAL_COMMUNITY): Payer: Self-pay

## 2021-11-28 ENCOUNTER — Other Ambulatory Visit: Payer: Self-pay

## 2021-11-28 VITALS — BP 124/68 | HR 81 | Ht 63.0 in | Wt 323.4 lb

## 2021-11-28 DIAGNOSIS — I272 Pulmonary hypertension, unspecified: Secondary | ICD-10-CM | POA: Insufficient documentation

## 2021-11-28 NOTE — Progress Notes (Signed)
Pulmonary Individual Treatment Plan ? ?Patient Details  ?Name: [name] [name] [facility] ?MRN: [id number] ?Date of Birth: February 17, 1961 ?Referring Provider:   ?Flowsheet Row Pulmonary Rehab Walk Test from 11/28/2021 in [place]  ?Referring Provider Mannam  ? ?  ? ? ?Initial Encounter Date:  ?Flowsheet Row Pulmonary Rehab Walk Test from 11/28/2021 in [place]  ?Date 11/28/21  ? ?  ? ? ?Visit Diagnosis: Pulmonary hypertension ([place]) ? ?Patient's Home Medications on Admission:  ? ?Current Outpatient Medications:  ?  albuterol (VENTOLIN HFA) 108 (90 Base) MCG/ACT inhaler, Inhale 2 puffs into the lungs every 6 (six) hours as needed for wheezing or shortness of breath., Disp: , Rfl:  ?  atorvastatin (LIPITOR) 20 MG tablet, Take 1 tablet (20 mg total) by mouth daily at 6 PM., Disp: 90 tablet, Rfl: 1 ?  Azelastine HCl 137 MCG/SPRAY SOLN, Place 1 spray into both nostrils daily., Disp: , Rfl:  ?  docusate sodium (COLACE) 100 MG capsule, Take 1 capsule (100 mg total) by mouth every 12 (twelve) hours., Disp: 30 capsule, Rfl: 0 ?  furosemide (LASIX) 40 MG tablet, Take 1 tablet (40 mg total) by mouth daily. Take extra 40 mg tablet once in the afternoon AS NEEDED for weight gain 3 lbs or more., Disp: 180 tablet, Rfl: 1 ?  gabapentin (NEURONTIN) 300 MG capsule, Take 300 mg by mouth 2 (two) times daily., Disp: , Rfl:  ?  HYDROcodone-acetaminophen (NORCO) 10-325 MG tablet, TAKE 1 TABLET BY MOUTH UP TO THREE TIMES DAILY AS NEEDED, Disp: , Rfl:  ?  LORazepam (ATIVAN) 1 MG tablet, Take by mouth., Disp: , Rfl:  ?  montelukast (SINGULAIR) 10 MG tablet, Take 10 mg by mouth every evening., Disp: , Rfl:  ?  omeprazole (PRILOSEC) 40 MG capsule, Take 40 mg by mouth 2 (two) times daily., Disp: , Rfl:  ?  polyethylene glycol powder (GLYCOLAX/MIRALAX) powder, Take 17 g by mouth 2 (two) times daily as needed., Disp: 3350 g, Rfl: 1 ?  QUEtiapine (SEROQUEL) 50 MG tablet, Take 50-100 mg by mouth  at bedtime as needed (sleep). , Disp: , Rfl:  ?  Riociguat (ADEMPAS) 2 MG TABS, Take 2 mg by mouth 3 (three) times daily., Disp: 180 tablet, Rfl: 3 ?  rivaroxaban (XARELTO) 20 MG TABS tablet, Take 1 tablet (20 mg total) by mouth daily with supper., Disp: 30 tablet, Rfl: 1 ?  spironolactone (ALDACTONE) 25 MG tablet, Take 25 mg by mouth every other day., Disp: , Rfl:  ?  amoxicillin-clavulanate (AUGMENTIN) 875-125 MG tablet, Take 11 tablets by mouth every 12 (twelve) hours. (Patient not taking: Reported on 11/28/2021), Disp: , Rfl:  ?  LORazepam (ATIVAN) 0.5 MG tablet, Take 0.5 mg by mouth at bedtime as needed for anxiety. (Patient not taking: Reported on 11/28/2021), Disp: , Rfl:  ?  meclizine (ANTIVERT) 25 MG tablet, Take 25 mg by mouth 3 (three) times daily as needed for dizziness. (Patient not taking: Reported on 11/28/2021), Disp: , Rfl:  ?  ondansetron (ZOFRAN) 4 MG tablet, Take 4 mg by mouth every 8 (eight) hours as needed for nausea or vomiting. (Patient not taking: Reported on 11/28/2021), Disp: , Rfl:  ?  temazepam (RESTORIL) 30 MG capsule, Take 30-60 mg by mouth at bedtime as needed for sleep.  (Patient not taking: Reported on 11/28/2021), Disp: , Rfl:  ? ?Past Medical History: ?Past Medical History:  ?Diagnosis Date  ? Anxiety   ? Arthritis   ?  CHF exacerbation ([place]) 08/21/2017  ? Depression   ? Depression   ? Difficult intubation   ? See [facility] anesthesia records in Care Everywhere  ? Diverticulitis   ? Dyspnea   ? with exertion  ? Family history of adverse reaction to anesthesia   ? " My mother stopped breathing during procedure"  ? GERD (gastroesophageal reflux disease)   ? PMH; resolved  ? Hyperlipidemia   ? Hypertension   ? Obesity   ? Plantar fasciitis, left   ? Pneumonia   ? Port-A-Cath in place   ? Pre-diabetes   ? Pulmonary embolism ([place])   ? Pulmonary hypertension ([place])   ? Sleep apnea   ? wears CPAP # 9  ? Wears glasses   ? ? ?Tobacco Use: ?Social History  ? ?Tobacco Use  ?Smoking Status Never   ?Smokeless Tobacco Never  ? ? ?Labs: ?Review Flowsheet   ? ?  ?  Latest Ref Rng & Units 06/01/2020 07/24/2020 08/10/2020 05/02/2021  ?Labs for ITP Cardiac and Pulmonary Rehab  ?Cholestrol 0 - 200 mg/dL 114    105   115    ?LDL (calc) 0 - 99 mg/dL 67    63   67    ?HDL-C >40 mg/dL 30    31   35    ?Trlycerides <150 mg/dL 84    56   66    ?Hemoglobin A1c 4.8 - 5.6 % 6.4     6.4    ?Bicarbonate 20.0 - 28.0 mmol/L  29.6    ? 28.5      ?TCO2 22 - 32 mmol/L  31    ? 30      ?O2 Saturation %  67.0    ? 68.0      ? ?  11/07/2021  ?Labs for ITP Cardiac and Pulmonary Rehab  ?Cholestrol 102    ?LDL (calc) 61    ?HDL-C 28    ?Trlycerides 66    ?Hemoglobin A1c   ?Bicarbonate   ?TCO2   ?O2 Saturation   ?  ? ? Multiple values from one day are sorted in reverse-chronological order  ?  ?  ? ? ?Capillary Blood Glucose: ?Lab Results  ?Component Value Date  ? GLUCAP 97 02/12/2019  ? GLUCAP 114 (H) 02/10/2019  ? GLUCAP 104 (H) 08/23/2017  ? GLUCAP 114 (H) 08/22/2017  ? GLUCAP 105 (H) 08/22/2017  ? ? ? ?Pulmonary Assessment Scores: ? Pulmonary Assessment Scores   ? ? [place] Name 11/28/21 1121  ?  ?  ?  ? ADL UCSD  ? ADL Phase Entry    ? SOB Score total 61    ?  ? CAT Score  ? CAT Score 16    ?  ? mMRC Score  ? mMRC Score 1    ? ?  ?  ? ?  ? ?UCSD: ?Self-administered rating of dyspnea associated with activities of daily living (ADLs) ?6-point scale (0 = "not at all" to 5 = "maximal or unable to do because of breathlessness")  ?Scoring Scores range from 0 to 120.  Minimally important difference is 5 units ? ?CAT: ?CAT can identify the health impairment of COPD patients and is better correlated with disease progression.  ?CAT has a scoring range of zero to 40. The CAT score is classified into four groups of low (less than 10), medium (10 - 20), high (21-30) and very high (31-40) based on the impact level of disease on health status. A CAT score  over 10 suggests significant symptoms.  A worsening CAT score could be explained by an exacerbation,  poor medication adherence, poor inhaler technique, or progression of COPD or comorbid conditions.  ?CAT MCID is 2 points ? ?mMRC: ?mMRC (Modified Medical Research Council) Dyspnea Scale is used to assess the degree of baseline functional disability in patients of respiratory disease due to dyspnea. ?No minimal important difference is established. A decrease in score of 1 point or greater is considered a positive change.  ? ?Pulmonary Function Assessment: ? Pulmonary Function Assessment - 11/28/21 1120   ? ?  ? Breath  ? Bilateral Breath Sounds Clear   ? Shortness of Breath Yes;Limiting activity   ? ?  ?  ? ?  ? ? ?Exercise Target Goals: ?Exercise Program Goal: ?Individual exercise prescription set using results from initial 6 min walk test and THRR while considering  patient?s activity barriers and safety.  ? ?Exercise Prescription Goal: ?Initial exercise prescription builds to 30-45 minutes a day of aerobic activity, 2-3 days per week.  Home exercise guidelines will be given to patient during program as part of exercise prescription that the participant will acknowledge. ? ?Activity Barriers & Risk Stratification: ? Activity Barriers & Cardiac Risk Stratification - 11/28/21 1121   ? ?  ? Activity Barriers & Cardiac Risk Stratification  ? Activity Barriers Arthritis;Shortness of Breath;Muscular Weakness;Deconditioning;History of Falls;Balance Concerns;Back Problems   ? Cardiac Risk Stratification Moderate   ? ?  ?  ? ?  ? ? ?6 Minute Walk: ? 6 Minute Walk   ? ? [place] Name 11/28/21 1223  ?  ?  ?  ? 6 Minute Walk  ? Phase Initial    ? Distance 920 feet    ? Walk Time 6 minutes    ? # of Rest Breaks 0    ? MPH 1.74    ? METS 1.75    ? RPE 12    ? Perceived Dyspnea  1    ? VO2 Peak 6.13    ? Symptoms No    ? Resting HR 102 bpm    ? Resting BP 124/68    ? Resting Oxygen Saturation  96 %    ? Exercise Oxygen Saturation  during 6 min walk 92 %    ? Max Ex. HR 143 bpm    ? Max Ex. BP 140/64    ? 2 Minute Post BP 132/68    ?   ? Interval HR  ? 1 Minute HR 110    ? 2 Minute HR 122    ? 3 Minute HR 128    ? 4 Minute HR 134    ? 5 Minute HR 138    ? 6 Minute HR 143    ? 2 Minute Post HR 94    ? Interval Heart Rate? Yes    ?  ? Interval O

## 2021-11-28 NOTE — Progress Notes (Signed)
[name] [name] 61 y.o. female ?Pulmonary Rehab Orientation Note ?This patient who was referred to Pulmonary Rehab by Dr. Vaughan Browner with the diagnosis of Pulmonary Hypertension arrived today in Cardiac and Pulmonary Rehab. She  arrived with with assistive device normal gait. She  does not carry portable oxygen. Per pt, Wing uses 2L of oxygen at night. Color good, skin warm and dry. Patient is oriented to time and place. Patient's medical history, psychosocial health, and medications reviewed. Psychosocial assessment reveals pt lives with spouse. [name] is currently on disability. Pt hobbies include watching movies and spending time with family. Pt reports her stress level is high. Areas of stress/anxiety include family . Pt does exhibit signs of depression. Signs of depression include hopelessness and sadness and difficulty maintaining sleep. PHQ2/9 score 2/8. She is currently seeing a psychiatrist, therapist, and grieving counselor since dealing with the loss of family. [name] shows fair  coping skills with positive outlook on life. Offered emotional support and reassurance. Will continue to monitor and evaluate progress toward psychosocial goal(s) of decreasing stressors and improving quality of life. Physical assessment performed by Maurice Small, RN. Please see their orientation physical assessment note. Shakeda reports she does take medications as prescribed. Patient states she follows a low sodium  diet. The patient has been trying to lose weight through a healthy diet and exercise program.. Pt's weight will be monitored closely. Demonstration and practice of PLB using pulse oximeter. [name] able to return demonstration satisfactorily. Safety and hand hygiene in the exercise area reviewed with patient. Carlisia voices understanding of the information reviewed. Department expectations discussed with patient and achievable goals were set. The patient shows enthusiasm about attending the program and we look  forward to working with [name]. Tyrone completed a 6 min walk test today and is scheduled to begin exercise on 12/04/21 at 1:15pm.  ? ?[id number] ?Sheppard Plumber, MS, ACSM-CEP ?  ?

## 2021-11-30 NOTE — Progress Notes (Signed)
Pulmonary Rehab Orientation Physical Assessment Note ? ?Physical assessment reveals  Pt is alert and oriented x 3.  Heart rate is normal, breath sounds clear to auscultation, no wheezes, rales, or rhonchi. Reports productive cough. Bowel sounds present.  Pt denies abdominal discomfort, nausea, vomiting or diarrhea. Grip strength equal, strong. Distal pulses palpable; 1+swelling to lower extremities. Pt has not taken her diuretic due to this appt. Cherre Huger, BSN ?Cardiac and Pulmonary Rehab Nurse Navigator   ? ?

## 2021-12-04 ENCOUNTER — Telehealth (HOSPITAL_COMMUNITY): Payer: Self-pay | Admitting: Family Medicine

## 2021-12-04 ENCOUNTER — Encounter (HOSPITAL_COMMUNITY): Payer: Medicare HMO

## 2021-12-05 NOTE — Progress Notes (Signed)
Pulmonary Individual Treatment Plan ? ?Patient Details  ?Name: [name] [name] [facility] ?MRN: [id number] ?Date of Birth: December 04, 1960 ?Referring Provider:   ?Flowsheet Row Pulmonary Rehab Walk Test from 11/28/2021 in [place]  ?Referring Provider Mannam  ? ?  ? ? ?Initial Encounter Date:  ?Flowsheet Row Pulmonary Rehab Walk Test from 11/28/2021 in [place]  ?Date 11/28/21  ? ?  ? ? ?Visit Diagnosis: Pulmonary hypertension ([place]) ? ?Patient's Home Medications on Admission:  ? ?Current Outpatient Medications:  ?  albuterol (VENTOLIN HFA) 108 (90 Base) MCG/ACT inhaler, Inhale 2 puffs into the lungs every 6 (six) hours as needed for wheezing or shortness of breath., Disp: , Rfl:  ?  atorvastatin (LIPITOR) 20 MG tablet, Take 1 tablet (20 mg total) by mouth daily at 6 PM., Disp: 90 tablet, Rfl: 1 ?  Azelastine HCl 137 MCG/SPRAY SOLN, Place 1 spray into both nostrils daily., Disp: , Rfl:  ?  docusate sodium (COLACE) 100 MG capsule, Take 1 capsule (100 mg total) by mouth every 12 (twelve) hours., Disp: 30 capsule, Rfl: 0 ?  furosemide (LASIX) 40 MG tablet, Take 1 tablet (40 mg total) by mouth daily. Take extra 40 mg tablet once in the afternoon AS NEEDED for weight gain 3 lbs or more., Disp: 180 tablet, Rfl: 1 ?  gabapentin (NEURONTIN) 300 MG capsule, Take 300 mg by mouth 2 (two) times daily., Disp: , Rfl:  ?  HYDROcodone-acetaminophen (NORCO) 10-325 MG tablet, TAKE 1 TABLET BY MOUTH UP TO THREE TIMES DAILY AS NEEDED, Disp: , Rfl:  ?  LORazepam (ATIVAN) 1 MG tablet, Take by mouth., Disp: , Rfl:  ?  montelukast (SINGULAIR) 10 MG tablet, Take 10 mg by mouth every evening., Disp: , Rfl:  ?  omeprazole (PRILOSEC) 40 MG capsule, Take 40 mg by mouth 2 (two) times daily., Disp: , Rfl:  ?  polyethylene glycol powder (GLYCOLAX/MIRALAX) powder, Take 17 g by mouth 2 (two) times daily as needed., Disp: 3350 g, Rfl: 1 ?  QUEtiapine (SEROQUEL) 50 MG tablet, Take 50-100 mg by mouth  at bedtime as needed (sleep). , Disp: , Rfl:  ?  Riociguat (ADEMPAS) 2 MG TABS, Take 2 mg by mouth 3 (three) times daily., Disp: 180 tablet, Rfl: 3 ?  rivaroxaban (XARELTO) 20 MG TABS tablet, Take 1 tablet (20 mg total) by mouth daily with supper., Disp: 30 tablet, Rfl: 1 ?  spironolactone (ALDACTONE) 25 MG tablet, Take 25 mg by mouth every other day., Disp: , Rfl:  ?  amoxicillin-clavulanate (AUGMENTIN) 875-125 MG tablet, Take 11 tablets by mouth every 12 (twelve) hours. (Patient not taking: Reported on 11/28/2021), Disp: , Rfl:  ?  LORazepam (ATIVAN) 0.5 MG tablet, Take 0.5 mg by mouth at bedtime as needed for anxiety. (Patient not taking: Reported on 11/28/2021), Disp: , Rfl:  ?  meclizine (ANTIVERT) 25 MG tablet, Take 25 mg by mouth 3 (three) times daily as needed for dizziness. (Patient not taking: Reported on 11/28/2021), Disp: , Rfl:  ?  ondansetron (ZOFRAN) 4 MG tablet, Take 4 mg by mouth every 8 (eight) hours as needed for nausea or vomiting. (Patient not taking: Reported on 11/28/2021), Disp: , Rfl:  ?  temazepam (RESTORIL) 30 MG capsule, Take 30-60 mg by mouth at bedtime as needed for sleep.  (Patient not taking: Reported on 11/28/2021), Disp: , Rfl:  ? ?Past Medical History: ?Past Medical History:  ?Diagnosis Date  ? Anxiety   ? Arthritis   ?  CHF exacerbation ([place]) 08/21/2017  ? Depression   ? Depression   ? Difficult intubation   ? See [facility] anesthesia records in Care Everywhere  ? Diverticulitis   ? Dyspnea   ? with exertion  ? Family history of adverse reaction to anesthesia   ? " My mother stopped breathing during procedure"  ? GERD (gastroesophageal reflux disease)   ? PMH; resolved  ? Hyperlipidemia   ? Hypertension   ? Obesity   ? Plantar fasciitis, left   ? Pneumonia   ? Port-A-Cath in place   ? Pre-diabetes   ? Pulmonary embolism ([place])   ? Pulmonary hypertension ([place])   ? Sleep apnea   ? wears CPAP # 9  ? Wears glasses   ? ? ?Tobacco Use: ?Social History  ? ?Tobacco Use  ?Smoking Status Never   ?Smokeless Tobacco Never  ? ? ?Labs: ?Review Flowsheet   ? ?  ?  Latest Ref Rng & Units 06/01/2020 07/24/2020 08/10/2020 05/02/2021  ?Labs for ITP Cardiac and Pulmonary Rehab  ?Cholestrol 0 - 200 mg/dL 114    105   115    ?LDL (calc) 0 - 99 mg/dL 67    63   67    ?HDL-C >40 mg/dL 30    31   35    ?Trlycerides <150 mg/dL 84    56   66    ?Hemoglobin A1c 4.8 - 5.6 % 6.4     6.4    ?Bicarbonate 20.0 - 28.0 mmol/L  29.6    ? 28.5      ?TCO2 22 - 32 mmol/L  31    ? 30      ?O2 Saturation %  67.0    ? 68.0      ? ?  11/07/2021  ?Labs for ITP Cardiac and Pulmonary Rehab  ?Cholestrol 102    ?LDL (calc) 61    ?HDL-C 28    ?Trlycerides 66    ?Hemoglobin A1c   ?Bicarbonate   ?TCO2   ?O2 Saturation   ?  ? ? Multiple values from one day are sorted in reverse-chronological order  ?  ?  ? ? ?Capillary Blood Glucose: ?Lab Results  ?Component Value Date  ? GLUCAP 97 02/12/2019  ? GLUCAP 114 (H) 02/10/2019  ? GLUCAP 104 (H) 08/23/2017  ? GLUCAP 114 (H) 08/22/2017  ? GLUCAP 105 (H) 08/22/2017  ? ? ? ?Pulmonary Assessment Scores: ? Pulmonary Assessment Scores   ? ? [place] Name 11/28/21 1121  ?  ?  ?  ? ADL UCSD  ? ADL Phase Entry    ? SOB Score total 61    ?  ? CAT Score  ? CAT Score 16    ?  ? mMRC Score  ? mMRC Score 1    ? ?  ?  ? ?  ? ?UCSD: ?Self-administered rating of dyspnea associated with activities of daily living (ADLs) ?6-point scale (0 = "not at all" to 5 = "maximal or unable to do because of breathlessness")  ?Scoring Scores range from 0 to 120.  Minimally important difference is 5 units ? ?CAT: ?CAT can identify the health impairment of COPD patients and is better correlated with disease progression.  ?CAT has a scoring range of zero to 40. The CAT score is classified into four groups of low (less than 10), medium (10 - 20), high (21-30) and very high (31-40) based on the impact level of disease on health status. A CAT score  over 10 suggests significant symptoms.  A worsening CAT score could be explained by an exacerbation,  poor medication adherence, poor inhaler technique, or progression of COPD or comorbid conditions.  ?CAT MCID is 2 points ? ?mMRC: ?mMRC (Modified Medical Research Council) Dyspnea Scale is used to assess the degree of baseline functional disability in patients of respiratory disease due to dyspnea. ?No minimal important difference is established. A decrease in score of 1 point or greater is considered a positive change.  ? ?Pulmonary Function Assessment: ? Pulmonary Function Assessment - 11/28/21 1120   ? ?  ? Breath  ? Bilateral Breath Sounds Clear   ? Shortness of Breath Yes;Limiting activity   ? ?  ?  ? ?  ? ? ?Exercise Target Goals: ?Exercise Program Goal: ?Individual exercise prescription set using results from initial 6 min walk test and THRR while considering  patient?s activity barriers and safety.  ? ?Exercise Prescription Goal: ?Initial exercise prescription builds to 30-45 minutes a day of aerobic activity, 2-3 days per week.  Home exercise guidelines will be given to patient during program as part of exercise prescription that the participant will acknowledge. ? ?Activity Barriers & Risk Stratification: ? Activity Barriers & Cardiac Risk Stratification - 11/28/21 1121   ? ?  ? Activity Barriers & Cardiac Risk Stratification  ? Activity Barriers Arthritis;Shortness of Breath;Muscular Weakness;Deconditioning;History of Falls;Balance Concerns;Back Problems   ? Cardiac Risk Stratification Moderate   ? ?  ?  ? ?  ? ? ?6 Minute Walk: ? 6 Minute Walk   ? ? [place] Name 11/28/21 1223  ?  ?  ?  ? 6 Minute Walk  ? Phase Initial    ? Distance 920 feet    ? Walk Time 6 minutes    ? # of Rest Breaks 0    ? MPH 1.74    ? METS 1.75    ? RPE 12    ? Perceived Dyspnea  1    ? VO2 Peak 6.13    ? Symptoms No    ? Resting HR 102 bpm    ? Resting BP 124/68    ? Max Ex. HR 143 bpm    ? Max Ex. BP 140/64    ? 2 Minute Post BP 132/68    ?  ? Interval HR  ? 1 Minute HR 110    ? 2 Minute HR 122    ? 3 Minute HR 128    ? 4 Minute HR  134    ? 5 Minute HR 138    ? 6 Minute HR 143    ? 2 Minute Post HR 94    ? Interval Heart Rate? Yes    ?  ? Interval Oxygen  ? Interval Oxygen? Yes    ? Baseline Oxygen Saturation % 96 %    ? 1 Minute Oxygen Sat

## 2021-12-06 ENCOUNTER — Encounter (HOSPITAL_COMMUNITY): Payer: Medicare HMO

## 2021-12-07 ENCOUNTER — Ambulatory Visit: Payer: Medicare HMO | Admitting: Orthopaedic Surgery

## 2021-12-07 VITALS — Ht 63.0 in | Wt 323.4 lb

## 2021-12-07 DIAGNOSIS — M1711 Unilateral primary osteoarthritis, right knee: Secondary | ICD-10-CM | POA: Diagnosis not present

## 2021-12-07 MED ORDER — LIDOCAINE HCL 1 % IJ SOLN
2.0000 mL | INTRAMUSCULAR | Status: AC | PRN
  Administered 2021-12-07: 2 mL

## 2021-12-07 MED ORDER — METHYLPREDNISOLONE ACETATE 40 MG/ML IJ SUSP
40.0000 mg | INTRAMUSCULAR | Status: AC | PRN
  Administered 2021-12-07: 40 mg via INTRA_ARTICULAR

## 2021-12-07 MED ORDER — BUPIVACAINE HCL 0.5 % IJ SOLN
2.0000 mL | INTRAMUSCULAR | Status: AC | PRN
  Administered 2021-12-07: 2 mL via INTRA_ARTICULAR

## 2021-12-07 NOTE — Progress Notes (Signed)
? ?Office Visit Note ?  ?Patient: [name] [name]           ?Date of Birth: 1960-10-16           ?MRN: [id number] ?Visit Date: 12/07/2021 ?             ?Requested by: Ruthine Dose, MD ?[address] ?[place],  [place] [address] ?PCP: Ruthine Dose, MD ? ? ?Assessment & Plan: ?Visit Diagnoses:  ?1. Primary osteoarthritis of right knee   ? ? ?Plan: Impression is right knee OA exacerbation possible degenerative medial meniscus tear.  Treatment options discussed and we performed a cortisone injection today.  We will need an MRI if no relief from the injection. ? ?Follow-Up Instructions: No follow-ups on file.  ? ?Orders:  ?No orders of the defined types were placed in this encounter. ? ?No orders of the defined types were placed in this encounter. ? ? ? ? Procedures: ?Large Joint Inj: R knee on 12/07/2021 9:44 AM ?Indications: pain ?Details: 22 G needle ? ?Arthrogram: No ? ?Medications: 40 mg methylPREDNISolone acetate 40 MG/ML; 2 mL lidocaine 1 %; 2 mL bupivacaine 0.5 % ?Consent was given by the patient. Patient was prepped and draped in the usual sterile fashion.  ? ? ? ? ?Clinical Data: ?No additional findings. ? ? ?Subjective: ?Chief Complaint  ?Patient presents with  ? Right Knee - Pain  ? ? ?HPI ? ?[name] [name] is a very pleasant 61 year old female with right knee pain for years.  We have seen her in the past for bilateral knee pain.  She has had gel injections in the past that have helped temporarily.  She is experiencing swelling and giving way to the right knee.  She has pain mainly on the medial side of the knee. ? ?Review of Systems  ?Constitutional: Negative.   ?HENT: Negative.    ?Eyes: Negative.   ?Respiratory: Negative.    ?Cardiovascular: Negative.   ?Endocrine: Negative.   ?Musculoskeletal: Negative.   ?Neurological: Negative.   ?Hematological: Negative.   ?Psychiatric/Behavioral: Negative.    ?All other systems reviewed and are negative. ? ? ?Objective: ?Vital Signs: Ht '5\' 3"'$  (1.6 m)   Wt (!) 323 lb 6.7 oz  (146.7 kg)   BMI 57.29 kg/m?  ? ?Physical Exam ?Vitals and nursing note reviewed.  ?Constitutional:   ?   Appearance: She is well-developed.  ?Pulmonary:  ?   Effort: Pulmonary effort is normal.  ?Skin: ?   General: Skin is warm.  ?   Capillary Refill: Capillary refill takes less than 2 seconds.  ?Neurological:  ?   Mental Status: She is alert and oriented to person, place, and time.  ?Psychiatric:     ?   Behavior: Behavior normal.     ?   Thought Content: Thought content normal.     ?   Judgment: Judgment normal.  ? ? ?Ortho Exam ? ?Examination of the right knee shows medial joint line tenderness and pain with McMurray testing.  Trace effusion.  Patellofemoral crepitus with range of motion. ? ?Specialty Comments:  ?No specialty comments available. ? ?Imaging: ?No results found. ? ? ?PMFS History: ?Patient Active Problem List  ? Diagnosis Date Noted  ? GI bleed 02/25/2020  ? Pyelonephritis   ? Primary osteoarthritis of right knee 09/30/2019  ? Achilles tendon contracture, left   ? Leg pain, left 07/10/2018  ? Bilateral primary osteoarthritis of knee 05/12/2018  ? Plantar fasciitis of left foot 05/12/2018  ? Shortness of breath 01/16/2018  ?  Palliative care encounter 01/16/2018  ? Major depressive disorder, recurrent severe without psychotic features ([place]) 12/06/2017  ? CHF exacerbation ([name]) 08/21/2017  ? (HFpEF) heart failure with preserved ejection fraction ([place]) 08/20/2017  ? Lumbar radiculopathy 08/08/2017  ? Body mass index 50.0-59.9, adult ([place]) 08/08/2017  ? Hoarseness 08/08/2017  ? Chronic cough 04/10/2017  ? Rheumatoid arthritis involving multiple sites ([place]) 04/10/2017  ? Anxiety and depression 04/10/2017  ? Adjustment disorder with depressed mood 12/11/2016  ? Morbid obesity ([place]) 08/14/2016  ? Stroke ([place]) 06/27/2016  ? Right sided weakness 06/27/2016  ? Essential hypertension 06/27/2016  ? Atypical chest pain 06/27/2016  ? Difficult airway 03/29/2016  ? CTEPH (chronic thromboembolic pulmonary  hypertension) ([place]) 03/14/2016  ? OSA on CPAP 02/20/2016  ? Chronic pain syndrome 01/31/2016  ? Pulmonary embolus ([place]) 11/15/2015  ? Vertigo 11/15/2015  ? Depression 11/15/2015  ? ?Past Medical History:  ?Diagnosis Date  ? Anxiety   ? Arthritis   ? CHF exacerbation ([place]) 08/21/2017  ? Depression   ? Depression   ? Difficult intubation   ? See [facility] anesthesia records in Care Everywhere  ? Diverticulitis   ? Dyspnea   ? with exertion  ? Family history of adverse reaction to anesthesia   ? " My mother stopped breathing during procedure"  ? GERD (gastroesophageal reflux disease)   ? PMH; resolved  ? Hyperlipidemia   ? Hypertension   ? Obesity   ? Plantar fasciitis, left   ? Pneumonia   ? Port-A-Cath in place   ? Pre-diabetes   ? Pulmonary embolism ([place])   ? Pulmonary hypertension ([place])   ? Sleep apnea   ? wears CPAP # 9  ? Wears glasses   ?  ?Family History  ?Problem Relation Age of Onset  ? Lung cancer Maternal Grandmother   ? Dementia Mother   ? Dementia Father   ? Anxiety disorder Sister   ? Bipolar disorder Sister   ? Drug abuse Sister   ? Sexual abuse Maternal Aunt   ? Anxiety disorder Cousin   ? Anxiety disorder Sister   ? Bipolar disorder Sister   ? Drug abuse Sister   ? Breast cancer Neg Hx   ?  ?Past Surgical History:  ?Procedure Laterality Date  ? ABDOMINAL HYSTERECTOMY    ? BIOPSY  02/26/2020  ? Procedure: BIOPSY;  Surgeon: Ronnette Juniper, MD;  Location: [facility];  Service: Gastroenterology;;  ? BREAST REDUCTION SURGERY    ? CHOLECYSTECTOMY    ? COLONOSCOPY W/ BIOPSIES AND POLYPECTOMY    ? EMBOLECTOMY N/A   ? pulmonary embolectomy  ? ESOPHAGOGASTRODUODENOSCOPY N/A 02/26/2020  ? Procedure: ESOPHAGOGASTRODUODENOSCOPY (EGD);  Surgeon: Ronnette Juniper, MD;  Location: [facility];  Service: Gastroenterology;  Laterality: N/A;  ? FRACTURE SURGERY    ? right ankle  ? GASTROCNEMIUS RECESSION Left 02/12/2019  ? Procedure: LEFT GASTROCNEMIUS RECESSION, PLANTAR FASCIA RELEASE;  Surgeon: Newt Minion, MD;  Location: [facility];   Service: Orthopedics;  Laterality: Left;  ? IVC FILTER INSERTION    ? REDUCTION MAMMAPLASTY Bilateral   ? RIGHT HEART CATH N/A 10/28/2016  ? Procedure: Right Heart Cath;  Surgeon: Jolaine Artist, MD;  Location: [facility];  Service: Cardiovascular;  Laterality: N/A;  ? RIGHT HEART CATH N/A 08/26/2017  ? Procedure: RIGHT HEART CATH;  Surgeon: Jolaine Artist, MD;  Location: [facility];  Service: Cardiovascular;  Laterality: N/A;  ? RIGHT HEART CATH N/A 07/05/2019  ? Procedure: RIGHT HEART CATH;  Surgeon: Jolaine Artist, MD;  Location: [facility];  Service: Cardiovascular;  Laterality: N/A;  ? RIGHT HEART CATH N/A 07/24/2020  ? Procedure: RIGHT HEART CATH;  Surgeon: Jolaine Artist, MD;  Location: [facility];  Service: Cardiovascular;  Laterality: N/A;  ? ULTRASOUND GUIDANCE FOR VASCULAR ACCESS  08/26/2017  ? Procedure: Ultrasound Guidance For Vascular Access;  Surgeon: Jolaine Artist, MD;  Location: [facility];  Service: Cardiovascular;;  ? WISDOM TOOTH EXTRACTION    ? ?Social History  ? ?Occupational History  ? Occupation: Disability  ?Tobacco Use  ? Smoking status: Never  ? Smokeless tobacco: Never  ?Vaping Use  ? Vaping Use: Never used  ?Substance and Sexual Activity  ? Alcohol use: No  ? Drug use: No  ? Sexual activity: Yes  ?  Partners: Male  ?  Birth control/protection: Surgical  ? ? ? ? ? ? ?

## 2021-12-10 ENCOUNTER — Non-Acute Institutional Stay (HOSPITAL_COMMUNITY)
Admission: RE | Admit: 2021-12-10 | Discharge: 2021-12-10 | Disposition: A | Discharge status: Home / Self Care | Payer: Medicare HMO | Source: Ambulatory Visit | Attending: Internal Medicine | Admitting: Internal Medicine

## 2021-12-10 DIAGNOSIS — Z452 Encounter for adjustment and management of vascular access device: Secondary | ICD-10-CM | POA: Diagnosis present

## 2021-12-10 MED ORDER — SODIUM CHLORIDE 0.9% FLUSH
10.0000 mL | INTRAVENOUS | Status: AC | PRN
  Administered 2021-12-10: 10 mL

## 2021-12-10 MED ORDER — HEPARIN SOD (PORK) LOCK FLUSH 100 UNIT/ML IV SOLN
500.0000 [IU] | INTRAVENOUS | Status: AC | PRN
  Administered 2021-12-10: 500 [IU]

## 2021-12-10 NOTE — Progress Notes (Signed)
PATIENT CARE CENTER NOTE ?  ?  ?  ?Provider: Cipriano Mile, NP ?  ?  ?Procedure: Port-a-cath flush (dose # 2 of 11) ?  ?  ?Note: Port-a-cath accessed using sterile technique. Patient tolerated well. PAC flushed with Heparin and 0.9% NS  and deaccessed. Band-aid placed over site. Patient to come monthly for flushes. Patient alert, oriented and ambulatory at discharge. ?

## 2021-12-11 ENCOUNTER — Encounter (HOSPITAL_COMMUNITY)
Admission: RE | Admit: 2021-12-11 | Discharge: 2021-12-11 | Disposition: A | Discharge status: Home / Self Care | Payer: Medicare HMO | Source: Ambulatory Visit | Attending: Pulmonary Disease | Admitting: Pulmonary Disease

## 2021-12-11 ENCOUNTER — Telehealth: Payer: Self-pay | Admitting: Orthopaedic Surgery

## 2021-12-11 VITALS — Wt 324.7 lb

## 2021-12-11 DIAGNOSIS — I272 Pulmonary hypertension, unspecified: Secondary | ICD-10-CM | POA: Insufficient documentation

## 2021-12-11 NOTE — Progress Notes (Signed)
Daily Session Note ? ?Patient Details  ?Name: [name] [name] [facility] ?MRN: [id number] ?Date of Birth: September 12, 1960 ?Referring Provider:   ?Flowsheet Row Pulmonary Rehab Walk Test from 11/28/2021 in [facility]  ?Referring Provider Mannam  ? ?  ? ? ?Encounter Date: 12/11/2021 ? ?Check In: ? Session Check In - 12/11/21 1505   ? ?  ? Check-In  ? Supervising physician immediately available to respond to emergencies [facility] immediately available   ? Physician(s) Dr. Pietro Cassis   ? Location [facility]   ? Staff Present Elmon Else, MS, ACSM-CEP, Exercise Physiologist;Lisa Ysidro Evert, RN;David Lilyan Punt, MS, ACSM-CEP, CCRP, Exercise Physiologist   ? Virtual Visit No   ? Medication changes reported     No   ? Fall or balance concerns reported    No   ? Tobacco Cessation No Change   ? Warm-up and Cool-down Performed as group-led instruction   ? Resistance Training Performed Yes   ? VAD Patient? No   ? PAD/SET Patient? No   ?  ? Pain Assessment  ? Currently in Pain? Yes   ? Pain Score 9    ? Pain Location Knee   ? Pain Orientation Right   ? Pain Descriptors / Indicators Aching   ? Pain Onset More than a month ago   ? Pain Frequency Intermittent   ? Multiple Pain Sites No   ? ?  ?  ? ?  ? ? ?Capillary Blood Glucose: ?No results found for this or any previous visit (from the past 24 hour(s)). ? ? Exercise Prescription Changes - 12/11/21 1500   ? ?  ? Response to Exercise  ? Blood Pressure (Admit) 108/70   ? Blood Pressure (Exercise) 112/72   ? Blood Pressure (Exit) 110/74   ? Heart Rate (Admit) 94 bpm   ? Heart Rate (Exercise) 90 bpm   ? Heart Rate (Exit) 85 bpm   ? Oxygen Saturation (Admit) 95 %   ? Oxygen Saturation (Exercise) 97 %   ? Oxygen Saturation (Exit) 96 %   ? Rating of Perceived Exertion (Exercise) 11   ? Perceived Dyspnea (Exercise) 0   ? Duration Progress to 30 minutes of  aerobic without signs/symptoms of physical distress   ? Intensity THRR unchanged   ?  ? Progression  ?  Progression Continue to progress workloads to maintain intensity without signs/symptoms of physical distress.   ?  ? NuStep  ? Level 1   ? SPM 70   ? Minutes 17   ? METs 1.5   ? ?  ?  ? ?  ? ? ?Social History  ? ?Tobacco Use  ?Smoking Status Never  ?Smokeless Tobacco Never  ? ? ?Goals Met:  ?Proper associated with RPD/PD & O2 Sat ?Exercise tolerated well ?No report of concerns or symptoms today ?Strength training completed today ? ?Goals Unmet:  ?Not Applicable ? ?Comments: Service time is from 1320  to 1420.  ? ? ?Dr. Rodman Pickle is Medical Director for Pulmonary Rehab at [facility].  ?

## 2021-12-11 NOTE — Telephone Encounter (Signed)
Pt called requesting a call back concerning her cortisone injection. Pt states it didn't work and asking if she can submit to insurance company for gel injection in right knee. Please call pt about this matter at [phone]. ?

## 2021-12-12 NOTE — Telephone Encounter (Signed)
Please submit for gel inj ?

## 2021-12-13 ENCOUNTER — Encounter (HOSPITAL_COMMUNITY): Payer: Medicare HMO

## 2021-12-13 NOTE — Telephone Encounter (Signed)
Noted  

## 2021-12-18 ENCOUNTER — Encounter (HOSPITAL_COMMUNITY): Payer: Medicare HMO

## 2021-12-18 NOTE — Progress Notes (Signed)
Discharge Progress Report ? ?Patient Details  ?Name: [name] [name] [facility] ?MRN: [id number] ?Date of Birth: 02/09/1961 ?Referring Provider:   ?Flowsheet Row Pulmonary Rehab Walk Test from 11/28/2021 in [facility]  ?Referring Provider Mannam  ? ?  ? ? ? ?Number of Visits: 1 ? ?Reason for Discharge:  ?Early Exit:  Pt called after one exercise session and states that she will not be returning due to arthritis in her knees. She ask for [place] to cancel her appointments. ? ?Smoking History:  ?Social History  ? ?Tobacco Use  ?Smoking Status Never  ?Smokeless Tobacco Never  ? ? ?Diagnosis:  ?Pulmonary hypertension ([place]) ? ?ADL [facility]: ? Pulmonary Assessment Scores   ? ? [place] Name 11/28/21 1121  ?  ?  ?  ? ADL [facility]  ? ADL Phase Entry    ? SOB Score total 61    ?  ? CAT Score  ? CAT Score 16    ?  ? mMRC Score  ? mMRC Score 1    ? ?  ?  ? ?  ? ? ?Initial Exercise Prescription: ? Initial Exercise Prescription - 11/28/21 1200   ? ?  ? Date of Initial Exercise RX and Referring Provider  ? Date 11/28/21   ? Referring Provider Mannam   ? Expected Discharge Date 01/31/22   ?  ? NuStep  ? Level 1   ? SPM 70   ? Minutes 30   ? METs 2   ?  ? Prescription Details  ? Frequency (times per week) 2   ? Duration Progress to 30 minutes of continuous aerobic without signs/symptoms of physical distress   ?  ? Intensity  ? THRR 40-80% of Max Heartrate 64-129   ? Ratings of Perceived Exertion 11-13   ? Perceived Dyspnea 0-4   ?  ? Resistance Training  ? Training Prescription Yes   ? Weight red bands   ? Reps 10-15   ? ?  ?  ? ?  ? ? ?Discharge Exercise Prescription (Final Exercise Prescription Changes): ? Exercise Prescription Changes - 12/11/21 1500   ? ?  ? Response to Exercise  ? Blood Pressure (Admit) 108/70   ? Blood Pressure (Exercise) 112/72   ? Blood Pressure (Exit) 110/74   ? Heart Rate (Admit) 94 bpm   ? Heart Rate (Exercise) 90 bpm   ? Heart Rate (Exit) 85 bpm   ? Oxygen Saturation (Admit) 95 %   ? Oxygen  Saturation (Exercise) 97 %   ? Oxygen Saturation (Exit) 96 %   ? Rating of Perceived Exertion (Exercise) 11   ? Perceived Dyspnea (Exercise) 0   ? Duration Progress to 30 minutes of  aerobic without signs/symptoms of physical distress   ? Intensity THRR unchanged   ?  ? Progression  ? Progression Continue to progress workloads to maintain intensity without signs/symptoms of physical distress.   ?  ? NuStep  ? Level 1   ? SPM 70   ? Minutes 17   ? METs 1.5   ? ?  ?  ? ?  ? ? ?Functional Capacity: ? 6 Minute Walk   ? ? [place] Name 11/28/21 1223  ?  ?  ?  ? 6 Minute Walk  ? Phase Initial    ? Distance 920 feet    ? Walk Time 6 minutes    ? # of Rest Breaks 0    ? MPH 1.74    ?  METS 1.75    ? RPE 12    ? Perceived Dyspnea  1    ? VO2 Peak 6.13    ? Symptoms No    ? Resting HR 102 bpm    ? Resting BP 124/68    ? Max Ex. HR 143 bpm    ? Max Ex. BP 140/64    ? 2 Minute Post BP 132/68    ?  ? Interval HR  ? 1 Minute HR 110    ? 2 Minute HR 122    ? 3 Minute HR 128    ? 4 Minute HR 134    ? 5 Minute HR 138    ? 6 Minute HR 143    ? 2 Minute Post HR 94    ? Interval Heart Rate? Yes    ?  ? Interval Oxygen  ? Interval Oxygen? Yes    ? Baseline Oxygen Saturation % 96 %    ? 1 Minute Oxygen Saturation % 95 %    ? 1 Minute Liters of Oxygen 0 L    ? 2 Minute Oxygen Saturation % 95 %    ? 2 Minute Liters of Oxygen 0 L    ? 3 Minute Oxygen Saturation % 94 %    ? 3 Minute Liters of Oxygen 0 L    ? 4 Minute Oxygen Saturation % 94 %    ? 4 Minute Liters of Oxygen 0 L    ? 5 Minute Oxygen Saturation % 94 %    ? 5 Minute Liters of Oxygen 0 L    ? 6 Minute Oxygen Saturation % 92 %    ? 6 Minute Liters of Oxygen 0 L    ? 2 Minute Post Oxygen Saturation % 98 %    ? 2 Minute Post Liters of Oxygen 0 L    ? ?  ?  ? ?  ? ? ?Psychological, QOL, Others - Outcomes: ?PHQ 2/9: ? ?  11/28/2021  ? 11:10 AM 09/17/2018  ?  9:28 AM 08/13/2018  ?  3:47 PM 07/06/2018  ? 12:36 PM 07/01/2018  ? 10:30 AM  ?Depression screen PHQ 2/9  ?Decreased Interest '1 1 1 '$ 0 0   ?Down, Depressed, Hopeless 1 1 0 1 1  ?PHQ - 2 Score '2 2 1 1 1  '$ ?Altered sleeping '3 1 1    '$ ?Tired, decreased energy '1 1 3    '$ ?Change in appetite 1 0 1    ?Feeling bad or failure about yourself  1 0 0    ?Trouble concentrating 0 0 1    ?Moving slowly or fidgety/restless 0 0 1    ?Suicidal thoughts 0 0 0    ?PHQ-9 Score '8 4 8    '$ ?Difficult doing work/chores Somewhat difficult      ? ? ?Quality of Life: ? ? ?Personal Goals: ?Goals established at orientation with interventions provided to work toward goal. ? Personal Goals and Risk Factors at Admission - 11/28/21 1118   ? ?  ? Core Components/Risk Factors/Patient Goals on Admission  ?  Weight Management Weight Loss;Yes   ? Intervention Weight Management: Develop a combined nutrition and exercise program designed to reach desired caloric intake, while maintaining appropriate intake of nutrient and fiber, sodium and fats, and appropriate energy expenditure required for the weight goal.;Weight Management: Provide education and appropriate resources to help participant work on and attain dietary goals.;Weight Management/Obesity: Establish reasonable short term and long term weight  goals.;Obesity: Provide education and appropriate resources to help participant work on and attain dietary goals.   ? Admit Weight 324 lb 11.8 oz (147.3 kg)   ? Goal Weight: Short Term 299 lb (135.6 kg)   ? Expected Outcomes Short Term: Continue to assess and modify interventions until short term weight is achieved   ? Improve shortness of breath with ADL's Yes   ? Intervention Provide education, individualized exercise plan and daily activity instruction to help decrease symptoms of SOB with activities of daily living.   ? Expected Outcomes Short Term: Improve cardiorespiratory fitness to achieve a reduction of symptoms when performing ADLs;Long Term: Be able to perform more ADLs without symptoms or delay the onset of symptoms   ? Intervention Provide education, demonstration and support about  specific breathing techniuqes utilized for more efficient breathing. Include techniques such as pursed lipped breathing, diaphragmatic breathing and self-pacing activity.   ? Expected Outcomes Short Term: Participant will be able to demonstrate and use breathing techniques as needed throughout daily activities.   ? ?  ?  ? ?  ?  ? ?Personal Goals Discharge: ? Goals and Risk Factor Review   ? ? [place] Name 12/04/21 1528  ?  ?  ?  ?  ?  ? Core Components/Risk Factors/Patient Goals Review  ? Personal Goals Review Weight Management/Obesity;Develop more efficient breathing techniques such as purse lipped breathing and diaphragmatic breathing and practicing self-pacing with activity.;Increase knowledge of respiratory medications and ability to use respiratory devices properly.;Improve shortness of breath with ADL's      ? Review Adeleigh was to begin exercising in pulmonary rehab 12/04/2021, but due to right knee pain and family issues she will be absent the first week.      ? Expected Outcomes See admission goals.      ? ?  ?  ? ?  ? ? ?Exercise Goals and Review: ? Exercise Goals   ? ? [place] Name 11/28/21 1122  ?  ?  ?  ?  ?  ? Exercise Goals  ? Increase Physical Activity Yes      ? Intervention Provide advice, education, support and counseling about physical activity/exercise needs.;Develop an individualized exercise prescription for aerobic and resistive training based on initial evaluation findings, risk stratification, comorbidities and participant's personal goals.      ? Expected Outcomes Short Term: Attend rehab on a regular basis to increase amount of physical activity.;Long Term: Add in home exercise to make exercise part of routine and to increase amount of physical activity.;Long Term: Exercising regularly at least 3-5 days a week.      ? Increase Strength and Stamina Yes      ? Intervention Provide advice, education, support and counseling about physical activity/exercise needs.;Develop an individualized exercise  prescription for aerobic and resistive training based on initial evaluation findings, risk stratification, comorbidities and participant's personal goals.      ? Expected Outcomes Short Term: Increase workloads fro

## 2021-12-19 ENCOUNTER — Other Ambulatory Visit: Payer: Self-pay | Admitting: Physician Assistant

## 2021-12-19 ENCOUNTER — Ambulatory Visit
Admission: RE | Admit: 2021-12-19 | Discharge: 2021-12-19 | Disposition: A | Discharge status: Home / Self Care | Payer: Medicare HMO | Source: Ambulatory Visit | Attending: Physician Assistant | Admitting: Physician Assistant

## 2021-12-19 DIAGNOSIS — M170 Bilateral primary osteoarthritis of knee: Secondary | ICD-10-CM

## 2021-12-19 DIAGNOSIS — M160 Bilateral primary osteoarthritis of hip: Secondary | ICD-10-CM

## 2021-12-20 ENCOUNTER — Encounter (HOSPITAL_COMMUNITY): Payer: Medicare HMO

## 2021-12-25 ENCOUNTER — Encounter (HOSPITAL_COMMUNITY): Payer: Medicare HMO

## 2021-12-27 ENCOUNTER — Encounter (HOSPITAL_COMMUNITY): Payer: Medicare HMO

## 2021-12-31 ENCOUNTER — Telehealth: Payer: Self-pay | Admitting: Orthopaedic Surgery

## 2021-12-31 NOTE — Telephone Encounter (Signed)
Patient called advised the cortisone injection did not work. Patient asked if she can get the gel injection? The number to contact patient is [phone] ?

## 2022-01-01 ENCOUNTER — Encounter (HOSPITAL_COMMUNITY): Payer: Medicare HMO

## 2022-01-01 ENCOUNTER — Telehealth: Payer: Self-pay

## 2022-01-01 NOTE — Telephone Encounter (Signed)
Has this been approved yet? ? ?

## 2022-01-01 NOTE — Telephone Encounter (Signed)
VOB has been submitted for [place]ov[place], right knee. ?BV pending ?

## 2022-01-01 NOTE — Telephone Encounter (Signed)
Talked with patient concerning gel injection.  

## 2022-01-02 ENCOUNTER — Other Ambulatory Visit: Payer: Self-pay

## 2022-01-02 DIAGNOSIS — M1711 Unilateral primary osteoarthritis, right knee: Secondary | ICD-10-CM

## 2022-01-03 ENCOUNTER — Encounter (HOSPITAL_COMMUNITY): Payer: Medicare HMO

## 2022-01-06 ENCOUNTER — Other Ambulatory Visit (HOSPITAL_COMMUNITY): Payer: Self-pay | Admitting: Internal Medicine

## 2022-01-07 ENCOUNTER — Other Ambulatory Visit (HOSPITAL_COMMUNITY): Payer: Self-pay

## 2022-01-07 MED ORDER — RIVAROXABAN 20 MG PO TABS: 20.0000 mg | ORAL_TABLET | Freq: Every day | ORAL | 2 refills | Status: DC

## 2022-01-08 ENCOUNTER — Encounter (HOSPITAL_COMMUNITY): Payer: Medicare HMO

## 2022-01-08 ENCOUNTER — Ambulatory Visit: Payer: Medicare HMO | Admitting: Orthopaedic Surgery

## 2022-01-09 ENCOUNTER — Encounter (HOSPITAL_COMMUNITY): Payer: Medicare HMO

## 2022-01-10 ENCOUNTER — Encounter (HOSPITAL_COMMUNITY): Payer: Medicare HMO

## 2022-01-15 ENCOUNTER — Encounter (HOSPITAL_COMMUNITY): Payer: Medicare HMO

## 2022-01-15 ENCOUNTER — Ambulatory Visit: Payer: Medicare HMO | Admitting: Orthopaedic Surgery

## 2022-01-15 VITALS — Ht 64.0 in | Wt 316.2 lb

## 2022-01-15 DIAGNOSIS — M1711 Unilateral primary osteoarthritis, right knee: Secondary | ICD-10-CM

## 2022-01-15 NOTE — Progress Notes (Signed)
? ?Office Visit Note ?  ?Patient: [name] [name]           ?Date of Birth: 09-09-1961           ?MRN: [id number] ?Visit Date: 01/15/2022 ?             ?Requested by: Ruthine Dose, MD ?[address] ?[place],  [place] [address] ?PCP: Ruthine Dose, MD ? ? ?Assessment & Plan: ?Visit Diagnoses:  ?1. Unilateral primary osteoarthritis, right knee   ? ? ?Plan: Impression is right knee osteoarthritis.  Today, we proceeded with Monovisc injection.  We have also obtained a new height and weight.  The patient currently has a BMI of 54.  We have discussed needing to get down to a weight of 230 pounds in order to safely proceed with total knee arthroplasty she will then be at a BMI of under 40.  She understands and agrees.  Follow-up as needed. ? ?Follow-Up Instructions: Return if symptoms worsen or fail to improve.  ? ?Orders:  ?Orders Placed This Encounter  ?Procedures  ? Large Joint Inj: R knee  ? ?No orders of the defined types were placed in this encounter. ? ? ? ? Procedures: ?Large Joint Inj: R knee on 01/15/2022 2:02 PM ?Indications: pain ?Details: 22 G needle, anterolateral approach ?Medications: 2 mL lidocaine 1 %; 2 mL bupivacaine 0.25 %; 88 mg Hyaluronan 88 MG/4ML ? ? ? ? ?Clinical Data: ?No additional findings. ? ? ?Subjective: ?Chief Complaint  ?Patient presents with  ? Left Knee - Injections  ? ? ?HPI patient is a pleasant 61 year old female with underlying right knee osteoarthritis who comes in today for right knee Monovisc injection.  She has had these in the past which have provided many months relief.  She has been working at weight loss in order to proceed with total knee arthroplasty at some point in future.  She has lost approximately 90 pounds so far. ? ? ? ? ?Objective: ?Vital Signs: Ht '5\' 4"'$  (1.626 m)   Wt (!) 316 lb 3.2 oz (143.4 kg)   BMI 54.28 kg/m?  ? ? ? ?Ortho Exam unchanged knee exam ? ?Specialty Comments:  ?No specialty comments available. ? ?Imaging: ?No new imaging ? ? ? ? ?PMFS  History: ?Patient Active Problem List  ? Diagnosis Date Noted  ? GI bleed 02/25/2020  ? Pyelonephritis   ? Primary osteoarthritis of right knee 09/30/2019  ? Achilles tendon contracture, left   ? Leg pain, left 07/10/2018  ? Bilateral primary osteoarthritis of knee 05/12/2018  ? Plantar fasciitis of left foot 05/12/2018  ? Shortness of breath 01/16/2018  ? Palliative care encounter 01/16/2018  ? Major depressive disorder, recurrent severe without psychotic features ([place]) 12/06/2017  ? CHF exacerbation ([place]) 08/21/2017  ? (HFpEF) heart failure with preserved ejection fraction ([place]) 08/20/2017  ? Lumbar radiculopathy 08/08/2017  ? Body mass index 50.0-59.9, adult ([place]) 08/08/2017  ? Hoarseness 08/08/2017  ? Chronic cough 04/10/2017  ? Rheumatoid arthritis involving multiple sites ([place]) 04/10/2017  ? Anxiety and depression 04/10/2017  ? Adjustment disorder with depressed mood 12/11/2016  ? Morbid obesity ([place]) 08/14/2016  ? Stroke ([place]) 06/27/2016  ? Right sided weakness 06/27/2016  ? Essential hypertension 06/27/2016  ? Atypical chest pain 06/27/2016  ? Difficult airway 03/29/2016  ? CTEPH (chronic thromboembolic pulmonary hypertension) ([place]) 03/14/2016  ? OSA on CPAP 02/20/2016  ? Chronic pain syndrome 01/31/2016  ? Pulmonary embolus ([place]) 11/15/2015  ? Vertigo 11/15/2015  ? Depression 11/15/2015  ? ?Past  Medical History:  ?Diagnosis Date  ? Anxiety   ? Arthritis   ? CHF exacerbation ([place]) 08/21/2017  ? Depression   ? Depression   ? Difficult intubation   ? See [facility] anesthesia records in Care Everywhere  ? Diverticulitis   ? Dyspnea   ? with exertion  ? Family history of adverse reaction to anesthesia   ? " My mother stopped breathing during procedure"  ? GERD (gastroesophageal reflux disease)   ? PMH; resolved  ? Hyperlipidemia   ? Hypertension   ? Obesity   ? Plantar fasciitis, left   ? Pneumonia   ? Port-A-Cath in place   ? Pre-diabetes   ? Pulmonary embolism ([place])   ? Pulmonary hypertension ([place])   ? Sleep apnea    ? wears CPAP # 9  ? Wears glasses   ?  ?Family History  ?Problem Relation Age of Onset  ? Lung cancer Maternal Grandmother   ? Dementia Mother   ? Dementia Father   ? Anxiety disorder Sister   ? Bipolar disorder Sister   ? Drug abuse Sister   ? Sexual abuse Maternal Aunt   ? Anxiety disorder Cousin   ? Anxiety disorder Sister   ? Bipolar disorder Sister   ? Drug abuse Sister   ? Breast cancer Neg Hx   ?  ?Past Surgical History:  ?Procedure Laterality Date  ? ABDOMINAL HYSTERECTOMY    ? BIOPSY  02/26/2020  ? Procedure: BIOPSY;  Surgeon: Ronnette Juniper, MD;  Location: [place];  Service: Gastroenterology;;  ? BREAST REDUCTION SURGERY    ? CHOLECYSTECTOMY    ? COLONOSCOPY W/ BIOPSIES AND POLYPECTOMY    ? EMBOLECTOMY N/A   ? pulmonary embolectomy  ? ESOPHAGOGASTRODUODENOSCOPY N/A 02/26/2020  ? Procedure: ESOPHAGOGASTRODUODENOSCOPY (EGD);  Surgeon: Ronnette Juniper, MD;  Location: [facility];  Service: Gastroenterology;  Laterality: N/A;  ? FRACTURE SURGERY    ? right ankle  ? GASTROCNEMIUS RECESSION Left 02/12/2019  ? Procedure: LEFT GASTROCNEMIUS RECESSION, PLANTAR FASCIA RELEASE;  Surgeon: Newt Minion, MD;  Location: [facility];  Service: Orthopedics;  Laterality: Left;  ? IVC FILTER INSERTION    ? REDUCTION MAMMAPLASTY Bilateral   ? RIGHT HEART CATH N/A 10/28/2016  ? Procedure: Right Heart Cath;  Surgeon: Jolaine Artist, MD;  Location: [facility];  Service: Cardiovascular;  Laterality: N/A;  ? RIGHT HEART CATH N/A 08/26/2017  ? Procedure: RIGHT HEART CATH;  Surgeon: Jolaine Artist, MD;  Location: [place];  Service: Cardiovascular;  Laterality: N/A;  ? RIGHT HEART CATH N/A 07/05/2019  ? Procedure: RIGHT HEART CATH;  Surgeon: Jolaine Artist, MD;  Location: [place] [facility];  Service: Cardiovascular;  Laterality: N/A;  ? RIGHT HEART CATH N/A 07/24/2020  ? Procedure: RIGHT HEART CATH;  Surgeon: Jolaine Artist, MD;  Location: [place] [facility];  Service: Cardiovascular;  Laterality: N/A;   ? ULTRASOUND GUIDANCE FOR VASCULAR ACCESS  08/26/2017  ? Procedure: Ultrasound Guidance For Vascular Access;  Surgeon: Jolaine Artist, MD;  Location: [facility];  Service: Cardiovascular;;  ? WISDOM TOOTH EXTRACTION    ? ?Social History  ? ?Occupational History  ? Occupation: Disability  ?Tobacco Use  ? Smoking status: Never  ? Smokeless tobacco: Never  ?Vaping Use  ? Vaping Use: Never used  ?Substance and Sexual Activity  ? Alcohol use: No  ? Drug use: No  ? Sexual activity: Yes  ?  Partners: Male  ?  Birth control/protection: Surgical  ? ? ? ? ? ? ?

## 2022-01-16 MED ORDER — LIDOCAINE HCL 1 % IJ SOLN
2.0000 mL | INTRAMUSCULAR | Status: AC | PRN
  Administered 2022-01-15: 2 mL

## 2022-01-16 MED ORDER — HYALURONAN 88 MG/4ML IX SOSY
88.0000 mg | PREFILLED_SYRINGE | INTRA_ARTICULAR | Status: AC | PRN
  Administered 2022-01-15: 88 mg via INTRA_ARTICULAR

## 2022-01-16 MED ORDER — BUPIVACAINE HCL 0.25 % IJ SOLN
2.0000 mL | INTRAMUSCULAR | Status: AC | PRN
  Administered 2022-01-15: 2 mL via INTRA_ARTICULAR

## 2022-01-17 ENCOUNTER — Encounter (HOSPITAL_COMMUNITY): Payer: Medicare HMO

## 2022-01-22 ENCOUNTER — Encounter (HOSPITAL_COMMUNITY): Payer: Medicare HMO

## 2022-01-24 ENCOUNTER — Encounter (HOSPITAL_COMMUNITY): Payer: Medicare HMO

## 2022-01-25 ENCOUNTER — Other Ambulatory Visit (HOSPITAL_COMMUNITY): Payer: Self-pay

## 2022-01-25 ENCOUNTER — Telehealth (HOSPITAL_COMMUNITY): Payer: Self-pay | Admitting: Pharmacy Technician

## 2022-01-25 NOTE — Telephone Encounter (Signed)
Medication Samples have been provided to the patient.  Drug name: Xarelto       Strength: '20mg'$         Qty: 4  LOT: [id number]  Exp.Date: 10/24  Dosing instructions: Take 1 tab daily  The patient has been instructed regarding the correct time, dose, and frequency of taking this medication, including desired effects and most common side effects.   Latasha Buczkowski 4:05 PM 01/25/2022

## 2022-01-25 NOTE — Telephone Encounter (Signed)
Advanced Heart Failure Patient Advocate Encounter  Patient called and left a message regarding affordability of Xarelto. Went over 4% OOP requirement for [employer] assistance. The patient does not think she has met that at this time. In the meantime, I was able to sign the patient up for the [facility] select program. The patient will be receiving 30 day supplies from [facility] for $85. The patient will automatically be charged for the medication each month. The pharmacy should be sending her text messages when it's time to refill and once the RX is received.   "Should your therapy change or if automatic refills are no longer a fit for you, you may cancel at any time and fill your XARELTO prescription through [facility] on a month-by-month basis by calling [facility].  [place]  [phone]  Monday-Friday, 8:30 AM-8:00 PM ET"  Sent Heather (RN) a request for samples. Betha Loa will reach out for RX.  Charlann Boxer, CPhT

## 2022-01-29 ENCOUNTER — Encounter (HOSPITAL_COMMUNITY): Payer: Medicare HMO

## 2022-01-31 ENCOUNTER — Encounter (HOSPITAL_COMMUNITY): Payer: Medicare HMO

## 2022-02-01 ENCOUNTER — Telehealth: Payer: Self-pay | Admitting: Orthopaedic Surgery

## 2022-02-01 NOTE — Telephone Encounter (Signed)
Patient called. Says she is still in pain. Would like to know what she can do for the pain. Her call back number is [phone]

## 2022-02-05 ENCOUNTER — Other Ambulatory Visit: Payer: Self-pay | Admitting: Physician Assistant

## 2022-02-05 MED ORDER — TRAMADOL HCL 50 MG PO TABS: 50.0000 mg | ORAL_TABLET | Freq: Two times a day (BID) | ORAL | 2 refills | Status: DC | PRN

## 2022-02-05 NOTE — Telephone Encounter (Signed)
Pharmacy called in and asked why Tramadol was ordered when patient already has a 30 day supply of Hydrocodone, I advised pharmacy that she called in on the 26th and stated she was still in pain and tramadol was called in today, Patient stated she does not need medication so pharmacy wanted to know if we should cancel it per patient, and it was cancelled being she stated she did not need it

## 2022-02-05 NOTE — Telephone Encounter (Signed)
Only thing I see in her chart is tramadol from 8/22.  So is patient not in pain?

## 2022-02-05 NOTE — Telephone Encounter (Signed)
I sent in tramadol

## 2022-02-06 NOTE — Telephone Encounter (Signed)
Called pharm. Rx already cx.

## 2022-02-07 ENCOUNTER — Encounter (HOSPITAL_COMMUNITY): Payer: Medicare HMO

## 2022-02-11 ENCOUNTER — Encounter (HOSPITAL_COMMUNITY): Payer: Medicare HMO

## 2022-02-11 ENCOUNTER — Other Ambulatory Visit: Payer: Self-pay | Admitting: Student

## 2022-02-12 ENCOUNTER — Other Ambulatory Visit: Payer: Self-pay | Admitting: Student

## 2022-02-12 DIAGNOSIS — R0600 Dyspnea, unspecified: Secondary | ICD-10-CM

## 2022-02-12 DIAGNOSIS — I209 Angina pectoris, unspecified: Secondary | ICD-10-CM

## 2022-02-12 DIAGNOSIS — Z86718 Personal history of other venous thrombosis and embolism: Secondary | ICD-10-CM

## 2022-02-14 ENCOUNTER — Non-Acute Institutional Stay (HOSPITAL_COMMUNITY)
Admission: RE | Admit: 2022-02-14 | Discharge: 2022-02-14 | Disposition: A | Discharge status: Home / Self Care | Payer: Medicare HMO | Source: Ambulatory Visit | Attending: Internal Medicine | Admitting: Internal Medicine

## 2022-02-14 DIAGNOSIS — D649 Anemia, unspecified: Secondary | ICD-10-CM | POA: Insufficient documentation

## 2022-02-14 DIAGNOSIS — Z452 Encounter for adjustment and management of vascular access device: Secondary | ICD-10-CM | POA: Insufficient documentation

## 2022-02-14 DIAGNOSIS — R5383 Other fatigue: Secondary | ICD-10-CM | POA: Diagnosis not present

## 2022-02-14 LAB — CBC WITH DIFFERENTIAL/PLATELET
Abs Immature Granulocytes: 0.02 10*3/uL (ref 0.00–0.07)
Basophils Absolute: 0 10*3/uL (ref 0.0–0.1)
Basophils Relative: 0 %
Eosinophils Absolute: 0 10*3/uL (ref 0.0–0.5)
Eosinophils Relative: 1 %
HCT: 36.7 % (ref 36.0–46.0)
Hemoglobin: 11.4 g/dL — ABNORMAL LOW (ref 12.0–15.0)
Immature Granulocytes: 0 %
Lymphocytes Relative: 26 %
Lymphs Abs: 1.8 10*3/uL (ref 0.7–4.0)
MCH: 21.2 pg — ABNORMAL LOW (ref 26.0–34.0)
MCHC: 31.1 g/dL (ref 30.0–36.0)
MCV: 68.3 fL — ABNORMAL LOW (ref 80.0–100.0)
Monocytes Absolute: 0.4 10*3/uL (ref 0.1–1.0)
Monocytes Relative: 6 %
Neutro Abs: 4.7 10*3/uL (ref 1.7–7.7)
Neutrophils Relative %: 67 %
Platelets: 335 10*3/uL (ref 150–400)
RBC: 5.37 MIL/uL — ABNORMAL HIGH (ref 3.87–5.11)
RDW: 18.7 % — ABNORMAL HIGH (ref 11.5–15.5)
WBC: 7 10*3/uL (ref 4.0–10.5)
nRBC: 0 % (ref 0.0–0.2)

## 2022-02-14 LAB — BASIC METABOLIC PANEL
Anion gap: 6 (ref 5–15)
BUN: 10 mg/dL (ref 6–20)
CO2: 27 mmol/L (ref 22–32)
Calcium: 9.6 mg/dL (ref 8.9–10.3)
Chloride: 112 mmol/L — ABNORMAL HIGH (ref 98–111)
Creatinine, Ser: 0.9 mg/dL (ref 0.44–1.00)
GFR, Estimated: >60 mL/min (ref >60)
Glucose, Bld: 98 mg/dL (ref 70–99)
Potassium: 3.6 mmol/L (ref 3.5–5.1)
Sodium: 145 mmol/L (ref 135–145)

## 2022-02-14 LAB — IRON AND TIBC
Iron: 27 ug/dL — ABNORMAL LOW (ref 28–170)
Saturation Ratios: 9 % — ABNORMAL LOW (ref 10.4–31.8)
TIBC: 308 ug/dL (ref 250–450)
UIBC: 281 ug/dL

## 2022-02-14 LAB — VITAMIN D 25 HYDROXY (VIT D DEFICIENCY, FRACTURES): Vit D, 25-Hydroxy: 16.94 ng/mL — ABNORMAL LOW (ref 30–100)

## 2022-02-14 LAB — BRAIN NATRIURETIC PEPTIDE: B Natriuretic Peptide: 20.7 pg/mL (ref 0.0–100.0)

## 2022-02-14 LAB — VITAMIN B12: Vitamin B-12: 322 pg/mL (ref 180–914)

## 2022-02-14 LAB — FOLATE: Folate: 5.1 ng/mL — ABNORMAL LOW (ref >5.9)

## 2022-02-14 LAB — MAGNESIUM: Magnesium: 1.9 mg/dL (ref 1.7–2.4)

## 2022-02-14 LAB — FERRITIN: Ferritin: 68 ng/mL (ref 11–307)

## 2022-02-14 MED ORDER — HEPARIN SOD (PORK) LOCK FLUSH 100 UNIT/ML IV SOLN
500.0000 [IU] | INTRAVENOUS | Status: AC | PRN
  Administered 2022-02-14: 500 [IU]

## 2022-02-14 MED ORDER — SODIUM CHLORIDE 0.9% FLUSH
10.0000 mL | INTRAVENOUS | Status: AC | PRN
  Administered 2022-02-14: 10 mL

## 2022-02-14 NOTE — Progress Notes (Signed)
PATIENT CARE CENTER NOTE       Provider: Cipriano Mile, NP     Procedure: Port-a-cath flush (dose # 3 of 11) and lab draw     Note: Port-a-cath accessed using sterile technique. Labs drawn from port per Dr. Valentino Nose order. Patient tolerated well. PAC flushed with Heparin and 0.9% NS  and deaccessed. Band-aid placed over site. Patient to come monthly for flushes. Patient alert, oriented and ambulatory at discharge.

## 2022-02-15 LAB — ERYTHROPOIETIN: Erythropoietin: 12.8 m[IU]/mL (ref 2.6–18.5)

## 2022-03-01 ENCOUNTER — Encounter (HOSPITAL_COMMUNITY): Payer: Self-pay | Admitting: Emergency Medicine

## 2022-03-01 ENCOUNTER — Ambulatory Visit: Payer: Medicare HMO | Admitting: Orthopaedic Surgery

## 2022-03-01 ENCOUNTER — Other Ambulatory Visit: Payer: Self-pay

## 2022-03-01 ENCOUNTER — Emergency Department (HOSPITAL_COMMUNITY)
Admission: EM | Admit: 2022-03-01 | Discharge: 2022-03-02 | Disposition: A | Discharge status: Home / Self Care | Payer: Medicare HMO | Attending: Emergency Medicine | Admitting: Emergency Medicine

## 2022-03-01 ENCOUNTER — Emergency Department (HOSPITAL_COMMUNITY): Payer: Medicare HMO

## 2022-03-01 DIAGNOSIS — N390 Urinary tract infection, site not specified: Secondary | ICD-10-CM | POA: Diagnosis not present

## 2022-03-01 DIAGNOSIS — R202 Paresthesia of skin: Secondary | ICD-10-CM | POA: Diagnosis not present

## 2022-03-01 DIAGNOSIS — Z86711 Personal history of pulmonary embolism: Secondary | ICD-10-CM | POA: Diagnosis not present

## 2022-03-01 DIAGNOSIS — B9689 Other specified bacterial agents as the cause of diseases classified elsewhere: Secondary | ICD-10-CM | POA: Diagnosis not present

## 2022-03-01 DIAGNOSIS — Z7901 Long term (current) use of anticoagulants: Secondary | ICD-10-CM | POA: Insufficient documentation

## 2022-03-01 DIAGNOSIS — R071 Chest pain on breathing: Secondary | ICD-10-CM | POA: Insufficient documentation

## 2022-03-01 DIAGNOSIS — R059 Cough, unspecified: Secondary | ICD-10-CM | POA: Diagnosis not present

## 2022-03-01 DIAGNOSIS — I509 Heart failure, unspecified: Secondary | ICD-10-CM | POA: Diagnosis not present

## 2022-03-01 DIAGNOSIS — R109 Unspecified abdominal pain: Secondary | ICD-10-CM | POA: Diagnosis present

## 2022-03-01 DIAGNOSIS — I110 Hypertensive heart disease with heart failure: Secondary | ICD-10-CM | POA: Diagnosis not present

## 2022-03-01 DIAGNOSIS — R0781 Pleurodynia: Secondary | ICD-10-CM

## 2022-03-01 LAB — BASIC METABOLIC PANEL
Anion gap: 9 (ref 5–15)
BUN: 11 mg/dL (ref 6–20)
CO2: 27 mmol/L (ref 22–32)
Calcium: 9.8 mg/dL (ref 8.9–10.3)
Chloride: 105 mmol/L (ref 98–111)
Creatinine, Ser: 1.15 mg/dL — ABNORMAL HIGH (ref 0.44–1.00)
GFR, Estimated: 55 mL/min — ABNORMAL LOW (ref >60)
Glucose, Bld: 115 mg/dL — ABNORMAL HIGH (ref 70–99)
Potassium: 4 mmol/L (ref 3.5–5.1)
Sodium: 141 mmol/L (ref 135–145)

## 2022-03-01 LAB — CBC
HCT: 39.1 % (ref 36.0–46.0)
Hemoglobin: 12.2 g/dL (ref 12.0–15.0)
MCH: 21.3 pg — ABNORMAL LOW (ref 26.0–34.0)
MCHC: 31.2 g/dL (ref 30.0–36.0)
MCV: 68.1 fL — ABNORMAL LOW (ref 80.0–100.0)
Platelets: 355 10*3/uL (ref 150–400)
RBC: 5.74 MIL/uL — ABNORMAL HIGH (ref 3.87–5.11)
RDW: 19.2 % — ABNORMAL HIGH (ref 11.5–15.5)
WBC: 9.2 10*3/uL (ref 4.0–10.5)
nRBC: 0 % (ref 0.0–0.2)

## 2022-03-01 LAB — BRAIN NATRIURETIC PEPTIDE: B Natriuretic Peptide: 18.4 pg/mL (ref 0.0–100.0)

## 2022-03-01 LAB — TROPONIN I (HIGH SENSITIVITY): Troponin I (High Sensitivity): 3 ng/L (ref <18)

## 2022-03-02 ENCOUNTER — Emergency Department (HOSPITAL_COMMUNITY): Payer: Medicare HMO

## 2022-03-02 ENCOUNTER — Encounter (HOSPITAL_COMMUNITY): Payer: Self-pay

## 2022-03-02 LAB — URINALYSIS, ROUTINE W REFLEX MICROSCOPIC
Bilirubin Urine: NEGATIVE
Glucose, UA: NEGATIVE mg/dL
Hgb urine dipstick: NEGATIVE
Ketones, ur: NEGATIVE mg/dL
Leukocytes,Ua: NEGATIVE
Nitrite: POSITIVE — AB
Protein, ur: NEGATIVE mg/dL
Specific Gravity, Urine: 1.017 (ref 1.005–1.030)
pH: 6 (ref 5.0–8.0)

## 2022-03-02 LAB — TROPONIN I (HIGH SENSITIVITY): Troponin I (High Sensitivity): 4 ng/L (ref <18)

## 2022-03-02 MED ORDER — ONDANSETRON HCL 4 MG/2ML IJ SOLN
4.0000 mg | Freq: Once | INTRAMUSCULAR | Status: AC
Start: 2022-03-02 — End: 2022-03-02
  Administered 2022-03-02: 4 mg via INTRAVENOUS
  Filled 2022-03-02: qty 2

## 2022-03-02 MED ORDER — IOHEXOL 350 MG/ML SOLN
100.0000 mL | Freq: Once | INTRAVENOUS | Status: AC | PRN
  Administered 2022-03-02: 100 mL via INTRAVENOUS

## 2022-03-02 MED ORDER — LIDOCAINE 5 % EX PTCH: 1.0000 | MEDICATED_PATCH | CUTANEOUS | 0 refills | Status: DC

## 2022-03-02 MED ORDER — HEPARIN SOD (PORK) LOCK FLUSH 100 UNIT/ML IV SOLN
500.0000 [IU] | INTRAVENOUS | Status: AC | PRN
Start: 2022-03-02 — End: 2022-03-02
  Administered 2022-03-02: 500 [IU]

## 2022-03-02 MED ORDER — HYDROMORPHONE HCL 1 MG/ML IJ SOLN
1.0000 mg | Freq: Once | INTRAMUSCULAR | Status: AC
  Administered 2022-03-02: 1 mg via INTRAVENOUS
  Filled 2022-03-02: qty 1

## 2022-03-02 MED ORDER — CHLORHEXIDINE GLUCONATE CLOTH 2 % EX PADS: 6.0000 | MEDICATED_PAD | Freq: Every day | CUTANEOUS | Status: DC

## 2022-03-02 MED ORDER — SODIUM CHLORIDE 0.9% FLUSH
10.0000 mL | Freq: Two times a day (BID) | INTRAVENOUS | Status: DC
  Administered 2022-03-02: 10 mL

## 2022-03-02 MED ORDER — ONDANSETRON HCL 4 MG/2ML IJ SOLN
4.0000 mg | Freq: Once | INTRAMUSCULAR | Status: AC
  Administered 2022-03-02: 4 mg via INTRAVENOUS
  Filled 2022-03-02: qty 2

## 2022-03-02 MED ORDER — LIDOCAINE 5 % EX PTCH
1.0000 | MEDICATED_PATCH | Freq: Once | CUTANEOUS | Status: DC
  Administered 2022-03-02: 1 via TRANSDERMAL
  Filled 2022-03-02: qty 1

## 2022-03-02 MED ORDER — SODIUM CHLORIDE 0.9 % IV SOLN
2.0000 g | Freq: Once | INTRAVENOUS | Status: AC
  Administered 2022-03-02: 2 g via INTRAVENOUS
  Filled 2022-03-02: qty 20

## 2022-03-02 MED ORDER — CEPHALEXIN 500 MG PO CAPS: 500.0000 mg | ORAL_CAPSULE | Freq: Two times a day (BID) | ORAL | 0 refills | Status: AC

## 2022-03-02 MED ORDER — MORPHINE SULFATE (PF) 4 MG/ML IV SOLN: 4.0000 mg | Freq: Once | INTRAVENOUS | Status: DC

## 2022-03-02 NOTE — ED Notes (Signed)
Iv team consult placed for port access

## 2022-03-02 NOTE — ED Notes (Addendum)
Pt transported to the restroom via wheelchair. Pt in restroom having SOB and is tachypnea. Advised pt that if she is that SOB going to the restroom that she didn't need to do this anymore that we needed to put a purewick on her

## 2022-03-02 NOTE — ED Notes (Signed)
Iv team at bedside  

## 2022-03-14 ENCOUNTER — Encounter (HOSPITAL_COMMUNITY): Payer: Medicare HMO

## 2022-03-22 ENCOUNTER — Other Ambulatory Visit: Payer: Medicare HMO

## 2022-03-28 ENCOUNTER — Other Ambulatory Visit: Payer: Self-pay | Admitting: Nurse Practitioner

## 2022-03-28 DIAGNOSIS — M5416 Radiculopathy, lumbar region: Secondary | ICD-10-CM

## 2022-04-03 ENCOUNTER — Other Ambulatory Visit (HOSPITAL_COMMUNITY): Payer: Self-pay | Admitting: *Deleted

## 2022-04-03 ENCOUNTER — Encounter (HOSPITAL_COMMUNITY): Payer: Self-pay | Admitting: Internal Medicine

## 2022-04-03 ENCOUNTER — Ambulatory Visit (HOSPITAL_COMMUNITY)
Admission: RE | Admit: 2022-04-03 | Discharge: 2022-04-03 | Disposition: A | Discharge status: Home / Self Care | Payer: Medicare HMO | Source: Ambulatory Visit | Attending: Internal Medicine | Admitting: Internal Medicine

## 2022-04-03 VITALS — BP 108/70 | HR 85 | Wt 305.0 lb

## 2022-04-03 DIAGNOSIS — I2782 Chronic pulmonary embolism: Secondary | ICD-10-CM | POA: Insufficient documentation

## 2022-04-03 DIAGNOSIS — Z95828 Presence of other vascular implants and grafts: Secondary | ICD-10-CM | POA: Insufficient documentation

## 2022-04-03 DIAGNOSIS — R002 Palpitations: Secondary | ICD-10-CM | POA: Insufficient documentation

## 2022-04-03 DIAGNOSIS — I272 Pulmonary hypertension, unspecified: Secondary | ICD-10-CM

## 2022-04-03 DIAGNOSIS — I5030 Unspecified diastolic (congestive) heart failure: Secondary | ICD-10-CM

## 2022-04-03 DIAGNOSIS — F419 Anxiety disorder, unspecified: Secondary | ICD-10-CM | POA: Diagnosis not present

## 2022-04-03 DIAGNOSIS — F32A Depression, unspecified: Secondary | ICD-10-CM | POA: Diagnosis not present

## 2022-04-03 DIAGNOSIS — R079 Chest pain, unspecified: Secondary | ICD-10-CM | POA: Diagnosis not present

## 2022-04-03 DIAGNOSIS — G4733 Obstructive sleep apnea (adult) (pediatric): Secondary | ICD-10-CM | POA: Diagnosis not present

## 2022-04-03 DIAGNOSIS — I493 Ventricular premature depolarization: Secondary | ICD-10-CM | POA: Diagnosis not present

## 2022-04-03 DIAGNOSIS — I2724 Chronic thromboembolic pulmonary hypertension: Secondary | ICD-10-CM | POA: Diagnosis present

## 2022-04-03 DIAGNOSIS — M797 Fibromyalgia: Secondary | ICD-10-CM | POA: Diagnosis not present

## 2022-04-03 DIAGNOSIS — Z9989 Dependence on other enabling machines and devices: Secondary | ICD-10-CM

## 2022-04-03 DIAGNOSIS — G894 Chronic pain syndrome: Secondary | ICD-10-CM | POA: Diagnosis not present

## 2022-04-03 DIAGNOSIS — Z79899 Other long term (current) drug therapy: Secondary | ICD-10-CM | POA: Insufficient documentation

## 2022-04-03 DIAGNOSIS — M069 Rheumatoid arthritis, unspecified: Secondary | ICD-10-CM | POA: Insufficient documentation

## 2022-04-03 DIAGNOSIS — Z7901 Long term (current) use of anticoagulants: Secondary | ICD-10-CM | POA: Diagnosis not present

## 2022-04-03 DIAGNOSIS — Z6841 Body Mass Index (BMI) 40.0 and over, adult: Secondary | ICD-10-CM | POA: Insufficient documentation

## 2022-04-03 MED ORDER — RIVAROXABAN 20 MG PO TABS: 20.0000 mg | ORAL_TABLET | Freq: Every day | ORAL | 11 refills | Status: DC

## 2022-04-03 NOTE — H&P (View-Only) (Signed)
Pulmonary Hypertension Clinic Note  Date:  04/03/2022   ID:  [name], [name] [birth year], MRN [id number]  PCP:  Ruthine Dose, MD  HF MD: Dr Haroldine Laws    HPI: [name] [name] is a 61 y.o. 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   In [place] in 09/2015 diagnosed with saddle PE and started on anticoagulation. She was very briefly on warfarin but quickly switched to Xarelto.   She was referred to [facility] in 4/17. Echocardiogram showed normal LVEF 55-60%, normal LA, normal RV size, RVSP estimated as 70 mm Hg. She further was referred to [facility] and saw Dr. Karena Addison on 03/14/2016 where patient had several studies including V/Q scan which found high prob for PE; studies c/w CTEPH. It was felt that her chronic chest pain was caused by chronic PE. RHC/LHC/Pulm Angiogram 03/2016 confirmed diagnosis of CTEPH. She underwent pulmonary thromboembolectomy 03/31/2016 at [facility] with IVC filter placement. Pre-op cath with no CAD.   Seen by Dr. Karena Addison in 04/2016 for follow up. He felt like her heart size was decreasing. Plan was for lifelong [place] and ECHO and V/Q in 4 months.  CT angio at [facility] on 05/15/16 showed " persistent but partially recanalized PE within right lower lobe pulmonary artery. No new PE identified. "  She was admitted to [facility] in 06/2016 for continued chest pain and parasthesias. V/Q scan on 06/27/16 showed prominent ventilation perfusion mismatch in right c/w chronic PE. Head CT negative. She was continued on Xarelto.  Admitted to [facility] for suicidal ideation. She was then sent to [facility] for additional treatment.   Had repeat RHC in 10/20 which showed mild PAH (50/16 (28)) with high output (8.1l/min) and normal PVR (2.0 WU). Not candidate for additional selective pulmonary artery vasodialtors    Sleep study 9/22: AHI 4.4  In 11/21 had worsening symptoms. RHC unchanged PA 52/18 (30). PVR 2.8  Remains on Adempas 2 mg tid.   Last OV, 2/23, reported NYHA Class III symptoms w/ stable volume status. Was continued Adempas at '2mg'$  tid. BP was too low to add PDE-5. She was referred to pulmonary rehab and ordered to get repeat echo.   She returns today for f/u. Echo that was previously ordered has not been done yet. It appears she has canceled most of her pulmonary rehab sessions. She  is off Ozempic but has still lost a significant amount of wt since her last OV, down from 322 lb to 305 lb. Nevertheless, she feels her breathing is getting worse. Struggles with ADLs. NYHA II symptoms. Occasional edema. No CP. No syncope or presyncope.    [place] 11/21  RA = 8 RV = 49/10 PA = 52/18 (30) PCW = 12 Fick cardiac output/index = 6.5/2.7 PVR = 2.8 WU FA sat = 98% PA sat = 67%, 68% PaPi = 3.6  RHC 10/20 RA = 7 RV = 50/12 PA = 50/16 (28) PCW = 11 Fick cardiac output/index = 8.1/3.4 PVR = 2.0 WU FA sat = 99% PA sat = 72%, 77%  10/2016 RHC RA = 8 RV = 64/13 PA = 65/19 (35) PCW = 9 Fick cardiac output/index = 7.5/3.1 PVR = 3.5 WU Ao sat = 96% PA sat = 68%,70% SVC sat = 71%  Assessment:  1. Mild residual PAH in the setting of CTEPH s/p pulmonary thrombolectomy  2. Normal cardiac output 3. Normal left-sided filling pressures   Past Medical History:  Diagnosis Date   Anxiety    Arthritis    CHF exacerbation ([place]) 08/21/2017   Depression    Depression    Difficult intubation    See [facility] anesthesia records in Care Everywhere   Diverticulitis    Dyspnea    with exertion   Family history of adverse reaction to anesthesia    " My mother stopped breathing during procedure"   GERD (gastroesophageal reflux disease)    PMH; resolved   Hyperlipidemia    Hypertension    Obesity    Plantar fasciitis, left    Pneumonia    Port-A-Cath in place    Pre-diabetes    Pulmonary  embolism ([place])    Pulmonary hypertension ([place])    Sleep apnea    wears CPAP # 9   Wears glasses     Past Surgical History:  Procedure Laterality Date   ABDOMINAL HYSTERECTOMY     BIOPSY  02/26/2020   Procedure: BIOPSY;  Surgeon: Ronnette Juniper, MD;  Location: [facility] ENDOS[facility];  Service: Gastroenterology;;   BREAST REDUCTION SURGERY     CHOLECYSTECTOMY     COLONOSCOPY W/ BIOPSIES AND POLYPECTOMY     EMBOLECTOMY N/A    pulmonary embolectomy   ESOPHAGOGASTRODUODENOSCOPY N/A 02/26/2020   Procedure: ESOPHAGOGASTRODUODENOSCOPY (EGD);  Surgeon: Ronnette Juniper, MD;  Location: [facility];  Service: Gastroenterology;  Laterality: N/A;   FRACTURE SURGERY     right ankle   GASTROCNEMIUS RECESSION Left 02/12/2019   Procedure: LEFT GASTROCNEMIUS RECESSION, PLANTAR FASCIA RELEASE;  Surgeon: Newt Minion, MD;  Location: [place];  Service: Orthopedics;  Laterality: Left;   IVC FILTER INSERTION     REDUCTION MAMMAPLASTY Bilateral    RIGHT HEART CATH N/A 10/28/2016   Procedure: Right Heart Cath;  Surgeon: Jolaine Artist, MD;  Location: [place] [facility];  Service: Cardiovascular;  Laterality: N/A;   RIGHT HEART CATH N/A 08/26/2017   Procedure: RIGHT HEART CATH;  Surgeon: Jolaine Artist, MD;  Location: [place] [facility];  Service: Cardiovascular;  Laterality: N/A;   RIGHT HEART CATH N/A 07/05/2019   Procedure: RIGHT HEART CATH;  Surgeon: Jolaine Artist, MD;  Location: [place] [facility];  Service: Cardiovascular;  Laterality: N/A;   RIGHT HEART CATH N/A 07/24/2020   Procedure: RIGHT HEART CATH;  Surgeon: Jolaine Artist, MD;  Location: [place] [facility];  Service: Cardiovascular;  Laterality: N/A;   ULTRASOUND GUIDANCE FOR VASCULAR ACCESS  08/26/2017   Procedure: Ultrasound Guidance For Vascular Access;  Surgeon: Jolaine Artist, MD;  Location: [place] [facility];  Service: Cardiovascular;;   WISDOM TOOTH EXTRACTION      Current Medications:  Current Outpatient Medications on File  Prior to Encounter  Medication Sig Dispense Refill   albuterol (VENTOLIN HFA) 108 (90 Base) MCG/ACT inhaler Inhale 2 puffs into the lungs every 6 (six) hours as needed for wheezing or shortness of breath.     atorvastatin (LIPITOR) 20 MG tablet Take 1 tablet (20 mg total) by mouth daily at 6 PM. 90 tablet 1   Azelastine HCl 137 MCG/SPRAY SOLN Place 1 spray into both nostrils daily as needed (allergies).     docusate sodium (COLACE) 100 MG capsule Take 1 capsule (100 mg total) by mouth every 12 (twelve) hours. 30 capsule 0   furosemide (LASIX) 40 MG tablet TAKE 1 TABLET BY MOUTH ONCE DAILY. TAKE EXTRA 40 MG TABLET ONCE IN  THE AFTERNOON IF NEEDED FOR WEIGHT GAIN OF 3 LBS OR MORE 180 tablet 3   gabapentin (NEURONTIN) 300 MG capsule Take 300 mg by mouth 3 (three) times daily.     HYDROcodone-acetaminophen (NORCO) 10-325 MG tablet Take 1 tablet by mouth 3 (three) times daily as needed for moderate pain.     lidocaine (LIDODERM) 5 % Place 1 patch onto the skin daily. Remove & Discard patch within 12 hours or as directed by MD 5 patch 0   montelukast (SINGULAIR) 10 MG tablet Take 10 mg by mouth every evening.     Multiple Vitamin (MULTIVITAMIN ADULT PO) Take 1 tablet by mouth daily in the afternoon.     omeprazole (PRILOSEC) 40 MG capsule Take 40 mg by mouth 2 (two) times daily.     OXYGEN Inhale 2 L into the lungs See admin instructions. At bedtime and during the day when resting     polyethylene glycol (MIRALAX / GLYCOLAX) 17 g packet Take 17 g by mouth daily.     potassium chloride (KLOR-CON) 10 MEQ tablet Take 10 mEq by mouth daily.     Riociguat (ADEMPAS) 2 MG TABS Take 2 mg by mouth 3 (three) times daily. 180 tablet 3   rivaroxaban (XARELTO) 20 MG TABS tablet Take 1 tablet (20 mg total) by mouth daily with supper. 30 tablet 2   spironolactone (ALDACTONE) 25 MG tablet Take 12.5 mg by mouth every other day.     zolpidem (AMBIEN) 5 MG tablet Take 5 mg by mouth See admin instructions. Every other night      No current facility-administered medications on file prior to encounter.    Allergies:   [name].   Social History   Socioeconomic History   Marital status: Married    Spouse name: Not on file   Number of children: Not on file   Years of education: Not on file   Highest education level: 12th grade  Occupational History   Occupation: Disability  Tobacco Use   Smoking status: Never   Smokeless tobacco: Never  Vaping Use   Vaping Use: Never used  Substance and Sexual Activity   Alcohol use: No   Drug use: No   Sexual activity: Yes    Partners: Male    Birth control/protection: Surgical  Other Topics Concern   Not on file  Social History Narrative   Not on file   Social Determinants of Health   Financial Resource Strain: Not on file  Food Insecurity: Not on file  Transportation Needs: Not on file  Physical Activity: Not on file  Stress: Not on file  Social Connections: Not on file    Family History:  The patient's family history includes Anxiety disorder in her cousin, sister, and sister; Bipolar disorder in her sister and sister; Dementia in her father and mother; Drug abuse in her sister and sister; Lung cancer in her maternal grandmother; Sexual abuse in her maternal aunt.     Review of systems complete and found to be negative unless listed in HPI.   PHYSICAL EXAM:    Vitals:   04/03/22 1154  BP: 108/70  Pulse: 85  SpO2: 97%  Weight: (!) 138.3 kg (305 lb)     Wt Readings from Last 3 Encounters:  04/03/22 (!) 138.3 kg (305 lb)  03/01/22 (!) 154 kg (339 lb 8.1 oz)  01/15/22 (!) 143.4 kg (316 lb 3.2 oz)    PHYSICAL EXAM: General:  obese woman No resp  difficulty HEENT: normal Neck: supple. No obvious  JVD. Carotids 2+ bilat; no bruits. No lymphadenopathy or thryomegaly appreciated. Cor: PMI nondisplaced. Regular rate & rhythm. No rubs, gallops or murmurs. Lungs: clear Abdomen: obese soft, nontender, nondistended. No  hepatosplenomegaly. No bruits or masses. Good bowel sounds. Extremities: no cyanosis, clubbing, rash, edema Neuro: alert & orientedx3, cranial nerves grossly intact. moves all 4 extremities w/o difficulty. Affect pleasant    Studies/Labs Reviewed:   Recent Labs: 11/07/2021: ALT 12; TSH 0.405 02/14/2022: Magnesium 1.9 03/01/2022: B Natriuretic Peptide 18.4; BUN 11; Creatinine, Ser 1.15; Hemoglobin 12.2; Platelets 355; Potassium 4.0; Sodium 141   Lipid Panel    Component Value Date/Time   CHOL 102 11/07/2021 1436   TRIG 66 11/07/2021 1436   HDL 28 (L) 11/07/2021 1436   CHOLHDL 3.6 11/07/2021 1436   VLDL 13 11/07/2021 1436   LDLCALC 61 11/07/2021 1436    Additional studies/ records that were reviewed today include:   ECHO 8/18 EF 55-60%  RV normal RVSP 41mHG   2D ECHO: 12/15/2015 LV EF: 55% -   60% Study Conclusions - Left ventricle: The cavity size was normal. Wall thickness was normal. Systolic function was normal. The estimated ejection  fraction was in the range of 55% to 60%. - Pulmonary arteries: PA peak pressure: 70 mm Hg (S). - Pericardium, extracardiac: A trivial pericardial effusion was   identified.    2D ECHO 04/05/16 INTERPRETATION: NORMAL LEFT VENTRICULAR FUNCTION WITH MILD LVH SEVERE RV SYSTOLIC DYSFUNCTION (See above) VALVULAR REGURGITATION: TRIVIAL PR, MILD TR NO VALVULAR STENOSIS TRIVIAL PERICARDIAL EFFUSION Compared with prior Echo study on 03/14/2016: RVSP increased from 50 to 69   CATH right heart and coronary angiography: 03/[place] Value Ref Range  Cardiac Index (l/min/m2) 2.3 L/min/m2  Right Atrium Mean Pressure (mmHg) 11 mmHg   Right Ventricle Systolic Pressure (mmHg) 68 mmHg   Pulmonary Artery Mean Pressure (mmHg) 40 mmHg   Pulmonary Wedge Pressure (mmHg) 14 mmHg   Pulmonary Vascular Resistance (Wood units) 4.7    CT angio 05/15/16 IMPRESSION: Persistent but partially recanalized pulmonary embolus  within the right lower lobe pulmonary artery. No new pulmonary embolism is identified. Minimal bibasilar atelectatic changes stable from the prior exam.     V/Q scan 06/27/16 IMPRESSION: Prominent ventilation perfusion mismatch noted on the right. Finding suggests high probability right-sided pulmonary embolus in this patient with known right-sided pulmonary embolus.    ASSESSMENT & PLAN:    1. 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 No bleeding  - RHC 12/18 with very mild PAH and PVR 3.1 WU - Echo 10/19 shows EF 55-60% RV not well seen but looks ok No septal flattening  - Echo 9/20 LVEF 65% RV dilated with normal function. Septum flat. Personally reviewed - RHC 10/20 mild PAH 50/16 (28) with high output (8.1l/min) and normal PVR (2.0 WU). Not candidate for additional selective pulmonary artery vasodilators. - RHC 11/21 PA 52/18 (30) PCW 12 PVR 2.8 WU - Symptomatically worse today NYHA III-IIIB symptoms - Volume status looks ok. On lasix 40 daily and spiro 12.5  - Continue adempas at '2mg'$  tid and Xarelto - Unable to do 6MW - Will repeat RHC and echo  - Unable to tolerate Pulmonary Rehab  2. Chronic chest pain:  - Has chronic PE s/p thrombectomy, LHC  performed at [facility] 7/17 with no CAD.  - Resolved  3. Palpitations - ZIO 2/23 5 runs SVT. Rare PVCS  4. Fibromyalgia/Rheumatoid Arthitis:  - Follows with Rheumatology. No change.    5. Morbid obesity with OHS:  - Body mass index is 52.35 kg/m.  - Off Ozempic but continues to lose weight  6. OSA - Followed by Dr. Halford Chessman - now off CPAP. Wearing O2 at night - Sleep study with AHI 4.4   7. Depression/anxiety - Receiving outpatient therapy.   - Per PCP   Glori Bickers, MD  7:06 PM

## 2022-04-03 NOTE — Progress Notes (Signed)
Pulmonary Hypertension Clinic Note  Date:  04/03/2022   ID:  Aasiya, Creasey [birth year], MRN [id number]  PCP:  Ruthine Dose, MD  HF MD: Dr Haroldine Laws    HPI: [name] [name] is a 61 y.o. 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   In [place] in 09/2015 diagnosed with saddle PE and started on anticoagulation. She was very briefly on warfarin but quickly switched to Xarelto.   She was referred to [facility]iology in 4/17. Echocardiogram showed normal LVEF 55-60%, normal LA, normal RV size, RVSP estimated as 70 mm Hg. She further was referred to [facility] and saw Dr. Karena Addison on 03/14/2016 where patient had several studies including V/Q scan which found high prob for PE; studies c/w CTEPH. It was felt that her chronic chest pain was caused by chronic PE. RHC/LHC/Pulm Angiogram 03/2016 confirmed diagnosis of CTEPH. She underwent pulmonary thromboembolectomy 03/31/2016 at [facility] with IVC filter placement. Pre-op cath with no CAD.   Seen by Dr. Karena Addison in 04/2016 for follow up. He felt like her heart size was decreasing. Plan was for lifelong [place] and ECHO and V/Q in 4 months.  CT angio at [facility] on 05/15/16 showed " persistent but partially recanalized PE within right lower lobe pulmonary artery. No new PE identified. "  She was admitted to [facility] in 06/2016 for continued chest pain and parasthesias. V/Q scan on 06/27/16 showed prominent ventilation perfusion mismatch in right c/w chronic PE. Head CT negative. She was continued on Xarelto.  Admitted to [facility] for suicidal ideation. She was then sent to [facility] for additional treatment.   Had repeat RHC in 10/20 which showed mild PAH (50/16 (28)) with high output (8.1l/min) and normal PVR (2.0 WU). Not candidate for additional selective pulmonary artery vasodialtors    Sleep study 9/22: AHI 4.4  In 11/21 had worsening symptoms. RHC unchanged PA 52/18 (30). PVR 2.8  Remains on Adempas 2 mg tid.   Last OV, 2/23, reported NYHA Class III symptoms w/ stable volume status. Was continued Adempas at '2mg'$  tid. BP was too low to add PDE-5. She was referred to pulmonary rehab and ordered to get repeat echo.   She returns today for f/u. Echo that was previously ordered has not been done yet. It appears she has canceled most of her pulmonary rehab sessions. She  is off Ozempic but has still lost a significant amount of wt since her last OV, down from 322 lb to 305 lb. Nevertheless, she feels her breathing is getting worse. Struggles with ADLs. NYHA II symptoms. Occasional edema. No CP. No syncope or presyncope.    [place] 11/21  RA = 8 RV = 49/10 PA = 52/18 (30) PCW = 12 Fick cardiac output/index = 6.5/2.7 PVR = 2.8 WU FA sat = 98% PA sat = 67%, 68% PaPi = 3.6  RHC 10/20 RA = 7 RV = 50/12 PA = 50/16 (28) PCW = 11 Fick cardiac output/index = 8.1/3.4 PVR = 2.0 WU FA sat = 99% PA sat = 72%, 77%  10/2016 RHC RA = 8 RV = 64/13 PA = 65/19 (35) PCW = 9 Fick cardiac output/index = 7.5/3.1 PVR = 3.5 WU Ao sat = 96% PA sat = 68%,70% SVC sat = 71%  Assessment:  1. Mild residual PAH in the setting of CTEPH s/p pulmonary thrombolectomy  2. Normal cardiac output 3. Normal left-sided filling pressures   Past Medical History:  Diagnosis Date   Anxiety    Arthritis    CHF exacerbation ([place]) 08/21/2017   Depression    Depression    Difficult intubation    See [facility] anesthesia records in Care Everywhere   Diverticulitis    Dyspnea    with exertion   Family history of adverse reaction to anesthesia    " My mother stopped breathing during procedure"   GERD (gastroesophageal reflux disease)    PMH; resolved   Hyperlipidemia    Hypertension    Obesity    Plantar fasciitis, left    Pneumonia    Port-A-Cath in place    Pre-diabetes    Pulmonary  embolism ([place])    Pulmonary hypertension ([place])    Sleep apnea    wears CPAP # 9   Wears glasses     Past Surgical History:  Procedure Laterality Date   ABDOMINAL HYSTERECTOMY     BIOPSY  02/26/2020   Procedure: BIOPSY;  Surgeon: Ronnette Juniper, MD;  Location: [facility];  Service: Gastroenterology;;   BREAST REDUCTION SURGERY     CHOLECYSTECTOMY     COLONOSCOPY W/ BIOPSIES AND POLYPECTOMY     EMBOLECTOMY N/A    pulmonary embolectomy   ESOPHAGOGASTRODUODENOSCOPY N/A 02/26/2020   Procedure: ESOPHAGOGASTRODUODENOSCOPY (EGD);  Surgeon: Ronnette Juniper, MD;  Location: [place];  Service: Gastroenterology;  Laterality: N/A;   FRACTURE SURGERY     right ankle   GASTROCNEMIUS RECESSION Left 02/12/2019   Procedure: LEFT GASTROCNEMIUS RECESSION, PLANTAR FASCIA RELEASE;  Surgeon: Newt Minion, MD;  Location: [place];  Service: Orthopedics;  Laterality: Left;   IVC FILTER INSERTION     REDUCTION MAMMAPLASTY Bilateral    RIGHT HEART CATH N/A 10/28/2016   Procedure: Right Heart Cath;  Surgeon: Jolaine Artist, MD;  Location: [place] [facility];  Service: Cardiovascular;  Laterality: N/A;   RIGHT HEART CATH N/A 08/26/2017   Procedure: RIGHT HEART CATH;  Surgeon: Jolaine Artist, MD;  Location: [place] [facility];  Service: Cardiovascular;  Laterality: N/A;   RIGHT HEART CATH N/A 07/05/2019   Procedure: RIGHT HEART CATH;  Surgeon: Jolaine Artist, MD;  Location: [name] [facility];  Service: Cardiovascular;  Laterality: N/A;   RIGHT HEART CATH N/A 07/24/2020   Procedure: RIGHT HEART CATH;  Surgeon: Jolaine Artist, MD;  Location: [place] [facility];  Service: Cardiovascular;  Laterality: N/A;   ULTRASOUND GUIDANCE FOR VASCULAR ACCESS  08/26/2017   Procedure: Ultrasound Guidance For Vascular Access;  Surgeon: Jolaine Artist, MD;  Location: [place] [facility];  Service: Cardiovascular;;   WISDOM TOOTH EXTRACTION      Current Medications:  Current Outpatient Medications on File  Prior to Encounter  Medication Sig Dispense Refill   albuterol (VENTOLIN HFA) 108 (90 Base) MCG/ACT inhaler Inhale 2 puffs into the lungs every 6 (six) hours as needed for wheezing or shortness of breath.     atorvastatin (LIPITOR) 20 MG tablet Take 1 tablet (20 mg total) by mouth daily at 6 PM. 90 tablet 1   Azelastine HCl 137 MCG/SPRAY SOLN Place 1 spray into both nostrils daily as needed (allergies).     docusate sodium (COLACE) 100 MG capsule Take 1 capsule (100 mg total) by mouth every 12 (twelve) hours. 30 capsule 0   furosemide (LASIX) 40 MG tablet TAKE 1 TABLET BY MOUTH ONCE DAILY. TAKE EXTRA 40 MG TABLET ONCE IN  THE AFTERNOON IF NEEDED FOR WEIGHT GAIN OF 3 LBS OR MORE 180 tablet 3   gabapentin (NEURONTIN) 300 MG capsule Take 300 mg by mouth 3 (three) times daily.     HYDROcodone-acetaminophen (NORCO) 10-325 MG tablet Take 1 tablet by mouth 3 (three) times daily as needed for moderate pain.     lidocaine (LIDODERM) 5 % Place 1 patch onto the skin daily. Remove & Discard patch within 12 hours or as directed by MD 5 patch 0   montelukast (SINGULAIR) 10 MG tablet Take 10 mg by mouth every evening.     Multiple Vitamin (MULTIVITAMIN ADULT PO) Take 1 tablet by mouth daily in the afternoon.     omeprazole (PRILOSEC) 40 MG capsule Take 40 mg by mouth 2 (two) times daily.     OXYGEN Inhale 2 L into the lungs See admin instructions. At bedtime and during the day when resting     polyethylene glycol (MIRALAX / GLYCOLAX) 17 g packet Take 17 g by mouth daily.     potassium chloride (KLOR-CON) 10 MEQ tablet Take 10 mEq by mouth daily.     Riociguat (ADEMPAS) 2 MG TABS Take 2 mg by mouth 3 (three) times daily. 180 tablet 3   rivaroxaban (XARELTO) 20 MG TABS tablet Take 1 tablet (20 mg total) by mouth daily with supper. 30 tablet 2   spironolactone (ALDACTONE) 25 MG tablet Take 12.5 mg by mouth every other day.     zolpidem (AMBIEN) 5 MG tablet Take 5 mg by mouth See admin instructions. Every other night      No current facility-administered medications on file prior to encounter.    Allergies:   [name].   Social History   Socioeconomic History   Marital status: Married    Spouse name: Not on file   Number of children: Not on file   Years of education: Not on file   Highest education level: 12th grade  Occupational History   Occupation: Disability  Tobacco Use   Smoking status: Never   Smokeless tobacco: Never  Vaping Use   Vaping Use: Never used  Substance and Sexual Activity   Alcohol use: No   Drug use: No   Sexual activity: Yes    Partners: Male    Birth control/protection: Surgical  Other Topics Concern   Not on file  Social History Narrative   Not on file   Social Determinants of Health   Financial Resource Strain: Not on file  Food Insecurity: Not on file  Transportation Needs: Not on file  Physical Activity: Not on file  Stress: Not on file  Social Connections: Not on file    Family History:  The patient's family history includes Anxiety disorder in her cousin, sister, and sister; Bipolar disorder in her sister and sister; Dementia in her father and mother; Drug abuse in her sister and sister; Lung cancer in her maternal grandmother; Sexual abuse in her maternal aunt.     Review of systems complete and found to be negative unless listed in HPI.   PHYSICAL EXAM:    Vitals:   04/03/22 1154  BP: 108/70  Pulse: 85  SpO2: 97%  Weight: (!) 138.3 kg (305 lb)     Wt Readings from Last 3 Encounters:  04/03/22 (!) 138.3 kg (305 lb)  03/01/22 (!) 154 kg (339 lb 8.1 oz)  01/15/22 (!) 143.4 kg (316 lb 3.2 oz)    PHYSICAL EXAM: General:  obese woman No resp  difficulty HEENT: normal Neck: supple. No obvious  JVD. Carotids 2+ bilat; no bruits. No lymphadenopathy or thryomegaly appreciated. Cor: PMI nondisplaced. Regular rate & rhythm. No rubs, gallops or murmurs. Lungs: clear Abdomen: obese soft, nontender, nondistended. No  hepatosplenomegaly. No bruits or masses. Good bowel sounds. Extremities: no cyanosis, clubbing, rash, edema Neuro: alert & orientedx3, cranial nerves grossly intact. moves all 4 extremities w/o difficulty. Affect pleasant    Studies/Labs Reviewed:   Recent Labs: 11/07/2021: ALT 12; TSH 0.405 02/14/2022: Magnesium 1.9 03/01/2022: B Natriuretic Peptide 18.4; BUN 11; Creatinine, Ser 1.15; Hemoglobin 12.2; Platelets 355; Potassium 4.0; Sodium 141   Lipid Panel    Component Value Date/Time   CHOL 102 11/07/2021 1436   TRIG 66 11/07/2021 1436   HDL 28 (L) 11/07/2021 1436   CHOLHDL 3.6 11/07/2021 1436   VLDL 13 11/07/2021 1436   LDLCALC 61 11/07/2021 1436    Additional studies/ records that were reviewed today include:   ECHO 8/18 EF 55-60%  RV normal RVSP 63mHG   2D ECHO: 12/15/2015 LV EF: 55% -   60% Study Conclusions - Left ventricle: The cavity size was normal. Wall thickness was normal. Systolic function was normal. The estimated ejection  fraction was in the range of 55% to 60%. - Pulmonary arteries: PA peak pressure: 70 mm Hg (S). - Pericardium, extracardiac: A trivial pericardial effusion was   identified.    2D ECHO 04/05/16 INTERPRETATION: NORMAL LEFT VENTRICULAR FUNCTION WITH MILD LVH SEVERE RV SYSTOLIC DYSFUNCTION (See above) VALVULAR REGURGITATION: TRIVIAL PR, MILD TR NO VALVULAR STENOSIS TRIVIAL PERICARDIAL EFFUSION Compared with prior Echo study on 03/14/2016: RVSP increased from 50 to 69   CATH right heart and coronary angiography: 03/25/2016 [place] Name Value Ref Range  Cardiac Index (l/min/m2) 2.3 L/min/m2  Right Atrium Mean Pressure (mmHg) 11 mmHg   Right Ventricle Systolic Pressure (mmHg) 68 mmHg   Pulmonary Artery Mean Pressure (mmHg) 40 mmHg   Pulmonary Wedge Pressure (mmHg) 14 mmHg   Pulmonary Vascular Resistance (Wood units) 4.7    CT angio 05/15/16 IMPRESSION: Persistent but partially recanalized pulmonary embolus  within the right lower lobe pulmonary artery. No new pulmonary embolism is identified. Minimal bibasilar atelectatic changes stable from the prior exam.     V/Q scan 06/27/16 IMPRESSION: Prominent ventilation perfusion mismatch noted on the right. Finding suggests high probability right-sided pulmonary embolus in this patient with known right-sided pulmonary embolus.    ASSESSMENT & PLAN:    1. 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 No bleeding  - RHC 12/18 with very mild PAH and PVR 3.1 WU - Echo 10/19 shows EF 55-60% RV not well seen but looks ok No septal flattening  - Echo 9/20 LVEF 65% RV dilated with normal function. Septum flat. Personally reviewed - RHC 10/20 mild PAH 50/16 (28) with high output (8.1l/min) and normal PVR (2.0 WU). Not candidate for additional selective pulmonary artery vasodilators. - RHC 11/21 PA 52/18 (30) PCW 12 PVR 2.8 WU - Symptomatically worse today NYHA III-IIIB symptoms - Volume status looks ok. On lasix 40 daily and spiro 12.5  - Continue adempas at '2mg'$  tid and Xarelto - Unable to do 6MW - Will repeat RHC and echo  - Unable to tolerate Pulmonary Rehab  2. Chronic chest pain:  - Has chronic PE s/p thrombectomy, LHC  performed at [facility] 7/17 with no CAD.  - Resolved  3. Palpitations - ZIO 2/23 5 runs SVT. Rare PVCS  4. Fibromyalgia/Rheumatoid Arthitis:  - Follows with Rheumatology. No change.    5. Morbid obesity with OHS:  - Body mass index is 52.35 kg/m.  - Off Ozempic but continues to lose weight  6. OSA - Followed by Dr. Halford Chessman - now off CPAP. Wearing O2 at night - Sleep study with AHI 4.4   7. Depression/anxiety - Receiving outpatient therapy.   - Per PCP   Glori Bickers, MD  7:06 PM

## 2022-04-03 NOTE — Patient Instructions (Signed)
Medication Changes:  None, continue current medications  Lab Work:  We have given you an order to get labs done   Testing/Procedures:  Heart Catheterization on 04/16/22, see instructions below  Referrals:  none  Special Instructions // Education:  Do the following things EVERYDAY: Weigh yourself in the morning before breakfast. Write it down and keep it in a log. Take your medicines as prescribed Eat low salt foods--Limit salt (sodium) to 2000 mg per day.  Stay as active as you can everyday Limit all fluids for the day to less than 2 liters   Follow-Up in: 6 months, **PLEASE CALL OUR OFFICE IN NOVEMBER TO SCHEDULE THIS APPOINTMENT  At the [facility], you and your health needs are our priority. We have a designated team specialized in the treatment of Heart Failure. This Care Team includes your primary Heart Failure Specialized Cardiologist (physician), Advanced Practice Providers (APPs- Physician Assistants and Nurse Practitioners), and Pharmacist who all work together to provide you with the care you need, when you need it.   You may see any of the following providers on your designated Care Team at your next follow up:  Dr Glori Bickers Dr Haynes Kerns, NP Lyda Jester, [place] [facility] [place], [place] Audry Riles, PharmD   Please be sure to bring in all your medications bottles to every appointment.   Need to Contact us:  If you have any questions or concerns before your next appointment please send [place] a message through [place] or call our office at [phone].    TO LEAVE A MESSAGE FOR THE NURSE SELECT OPTION 2, PLEASE LEAVE A MESSAGE INCLUDING: YOUR NAME DATE OF BIRTH CALL BACK NUMBER REASON FOR CALL**this is important as we prioritize the call backs  YOU WILL RECEIVE A CALL BACK THE SAME DAY AS LONG AS YOU CALL BEFORE 4:00 PM    CATHETERIZATION INSTRUCTIONS   You are scheduled for a Cardiac Catheterization  on Tuesday, August 8 with Dr. Glori Bickers.  1. Please arrive at the Main Entrance A at [facility]: [place],  [address] at 8:30 AM (This time is two hours before your procedure to ensure your preparation). Free valet parking service is available.   Special note: Every effort is made to have your procedure done on time. Please understand that emergencies sometimes delay scheduled procedures.  2. Diet: Do not eat solid foods after midnight.  You may have clear liquids until 5 AM upon the day of the procedure.  3. Labs: TODAY  4. Medication instructions in preparation for your procedure:    MONDAY 8/7 PM DO NOT TAKE XARLETO  TUESDAY 8/8 AM DO NOT TAKE FUROSEMIDE    On the morning of your procedure, take any morning medicines NOT listed above.  You may use sips of water.  5. Plan to go home the same day, you will only stay overnight if medically necessary. 6. You MUST have a responsible adult to drive you home. 7. An adult MUST be with you the first 24 hours after you arrive home. 8. Bring a current list of your medications, and the last time and date medication taken. 9. Bring ID and current insurance cards. 10.Please wear clothes that are easy to get on and off and wear slip-on shoes.  Thank you for allowing [place] to care for you!   -- [facility]

## 2022-04-03 NOTE — Progress Notes (Signed)
Medication Samples have been provided to the patient.  Drug name: Xarelto       Strength: '20mg'$         Qty: 3  LOT: [id number]  Exp.Date: 11/2023  Dosing instructions: Take 1 tab Daily  The patient has been instructed regarding the correct time, dose, and frequency of taking this medication, including desired effects and most common side effects.   [name] [name] 12:19 PM 04/03/2022

## 2022-04-06 ENCOUNTER — Ambulatory Visit
Admission: RE | Admit: 2022-04-06 | Discharge: 2022-04-06 | Disposition: A | Discharge status: Home / Self Care | Payer: Medicare HMO | Source: Ambulatory Visit | Attending: Nurse Practitioner | Admitting: Nurse Practitioner

## 2022-04-06 DIAGNOSIS — M5416 Radiculopathy, lumbar region: Secondary | ICD-10-CM

## 2022-04-09 ENCOUNTER — Non-Acute Institutional Stay (HOSPITAL_COMMUNITY)
Admission: RE | Admit: 2022-04-09 | Discharge: 2022-04-09 | Disposition: A | Discharge status: Home / Self Care | Payer: Medicare HMO | Source: Ambulatory Visit | Attending: Internal Medicine | Admitting: Internal Medicine

## 2022-04-09 DIAGNOSIS — I5030 Unspecified diastolic (congestive) heart failure: Secondary | ICD-10-CM | POA: Insufficient documentation

## 2022-04-09 LAB — CBC
HCT: 35.8 % — ABNORMAL LOW (ref 36.0–46.0)
Hemoglobin: 11.2 g/dL — ABNORMAL LOW (ref 12.0–15.0)
MCH: 21.3 pg — ABNORMAL LOW (ref 26.0–34.0)
MCHC: 31.3 g/dL (ref 30.0–36.0)
MCV: 67.9 fL — ABNORMAL LOW (ref 80.0–100.0)
Platelets: 339 10*3/uL (ref 150–400)
RBC: 5.27 MIL/uL — ABNORMAL HIGH (ref 3.87–5.11)
RDW: 17.1 % — ABNORMAL HIGH (ref 11.5–15.5)
WBC: 7.9 10*3/uL (ref 4.0–10.5)
nRBC: 0 % (ref 0.0–0.2)

## 2022-04-09 LAB — BASIC METABOLIC PANEL
Anion gap: 6 (ref 5–15)
BUN: 13 mg/dL (ref 6–20)
CO2: 26 mmol/L (ref 22–32)
Calcium: 9.2 mg/dL (ref 8.9–10.3)
Chloride: 109 mmol/L (ref 98–111)
Creatinine, Ser: 0.87 mg/dL (ref 0.44–1.00)
GFR, Estimated: >60 mL/min (ref >60)
Glucose, Bld: 97 mg/dL (ref 70–99)
Potassium: 3.7 mmol/L (ref 3.5–5.1)
Sodium: 141 mmol/L (ref 135–145)

## 2022-04-09 LAB — BRAIN NATRIURETIC PEPTIDE: B Natriuretic Peptide: 24.3 pg/mL (ref 0.0–100.0)

## 2022-04-09 MED ORDER — SODIUM CHLORIDE 0.9% FLUSH
10.0000 mL | INTRAVENOUS | Status: AC | PRN
  Administered 2022-04-09: 10 mL

## 2022-04-09 MED ORDER — HEPARIN SOD (PORK) LOCK FLUSH 100 UNIT/ML IV SOLN
500.0000 [IU] | INTRAVENOUS | Status: AC | PRN
  Administered 2022-04-09: 500 [IU]
  Filled 2022-04-09: qty 5

## 2022-04-09 NOTE — Progress Notes (Signed)
PATIENT CARE CENTER NOTE  Provider: Cipriano Mile, NP   Procedure: Port-a. Cath- flush (dose # 4 of 11)   Note:  Port - a cath accessed using sterile technique,. Labs drawn from port per [place], [place] order. Patient tolerated well. PAC flushed with heparin and 0.9% NS and de-accessed. Band aid placed over site. Patient to come monthly for flushes. Alert, oriented and ambulatory at discharge

## 2022-04-10 ENCOUNTER — Telehealth (HOSPITAL_COMMUNITY): Payer: Self-pay | Admitting: *Deleted

## 2022-04-10 NOTE — Telephone Encounter (Signed)
[employer] approved  Approved Authorization #[id number]  Tracking #[id number]

## 2022-04-16 ENCOUNTER — Encounter (HOSPITAL_COMMUNITY): Payer: Self-pay | Admitting: Internal Medicine

## 2022-04-16 ENCOUNTER — Other Ambulatory Visit: Payer: Self-pay

## 2022-04-16 ENCOUNTER — Ambulatory Visit (HOSPITAL_COMMUNITY)
Admission: RE | Admit: 2022-04-16 | Discharge: 2022-04-16 | Disposition: A | Discharge status: Home / Self Care | Payer: Medicare HMO | Source: Ambulatory Visit | Attending: Internal Medicine | Admitting: Internal Medicine

## 2022-04-16 ENCOUNTER — Encounter (HOSPITAL_COMMUNITY)
Admission: RE | Disposition: A | Discharge status: Home / Self Care | Payer: Self-pay | Source: Ambulatory Visit | Attending: Internal Medicine

## 2022-04-16 DIAGNOSIS — F419 Anxiety disorder, unspecified: Secondary | ICD-10-CM | POA: Diagnosis not present

## 2022-04-16 DIAGNOSIS — G4733 Obstructive sleep apnea (adult) (pediatric): Secondary | ICD-10-CM | POA: Insufficient documentation

## 2022-04-16 DIAGNOSIS — I5030 Unspecified diastolic (congestive) heart failure: Secondary | ICD-10-CM

## 2022-04-16 DIAGNOSIS — G894 Chronic pain syndrome: Secondary | ICD-10-CM | POA: Insufficient documentation

## 2022-04-16 DIAGNOSIS — R079 Chest pain, unspecified: Secondary | ICD-10-CM | POA: Insufficient documentation

## 2022-04-16 DIAGNOSIS — I272 Pulmonary hypertension, unspecified: Secondary | ICD-10-CM | POA: Insufficient documentation

## 2022-04-16 DIAGNOSIS — Z7901 Long term (current) use of anticoagulants: Secondary | ICD-10-CM | POA: Insufficient documentation

## 2022-04-16 DIAGNOSIS — F32A Depression, unspecified: Secondary | ICD-10-CM | POA: Diagnosis not present

## 2022-04-16 DIAGNOSIS — Z6841 Body Mass Index (BMI) 40.0 and over, adult: Secondary | ICD-10-CM | POA: Insufficient documentation

## 2022-04-16 DIAGNOSIS — M069 Rheumatoid arthritis, unspecified: Secondary | ICD-10-CM | POA: Insufficient documentation

## 2022-04-16 DIAGNOSIS — I2721 Secondary pulmonary arterial hypertension: Secondary | ICD-10-CM | POA: Diagnosis not present

## 2022-04-16 DIAGNOSIS — M797 Fibromyalgia: Secondary | ICD-10-CM | POA: Insufficient documentation

## 2022-04-16 HISTORY — PX: RIGHT HEART CATH: CATH118263

## 2022-04-16 LAB — POCT I-STAT EG7
Acid-Base Excess: 2 mmol/L (ref 0.0–2.0)
Acid-Base Excess: 2 mmol/L (ref 0.0–2.0)
Acid-Base Excess: 2 mmol/L (ref 0.0–2.0)
Bicarbonate: 27.6 mmol/L (ref 20.0–28.0)
Bicarbonate: 26.9 mmol/L (ref 20.0–28.0)
Bicarbonate: 27.7 mmol/L (ref 20.0–28.0)
Calcium, Ion: 1.35 mmol/L (ref 1.15–1.40)
Calcium, Ion: 1.27 mmol/L (ref 1.15–1.40)
Calcium, Ion: 1.33 mmol/L (ref 1.15–1.40)
HCT: 37 % (ref 36.0–46.0)
HCT: 35 % — ABNORMAL LOW (ref 36.0–46.0)
HCT: 36 % (ref 36.0–46.0)
Hemoglobin: 12.6 g/dL (ref 12.0–15.0)
Hemoglobin: 11.9 g/dL — ABNORMAL LOW (ref 12.0–15.0)
Hemoglobin: 12.2 g/dL (ref 12.0–15.0)
O2 Saturation: 68 %
O2 Saturation: 62 %
O2 Saturation: 71 %
Potassium: 3.8 mmol/L (ref 3.5–5.1)
Potassium: 3.7 mmol/L (ref 3.5–5.1)
Potassium: 3.7 mmol/L (ref 3.5–5.1)
Sodium: 143 mmol/L (ref 135–145)
Sodium: 144 mmol/L (ref 135–145)
Sodium: 144 mmol/L (ref 135–145)
TCO2: 29 mmol/L (ref 22–32)
TCO2: 28 mmol/L (ref 22–32)
TCO2: 29 mmol/L (ref 22–32)
pCO2, Ven: 44.4 mmHg (ref 44–60)
pCO2, Ven: 43.2 mmHg — ABNORMAL LOW (ref 44–60)
pCO2, Ven: 45.8 mmHg (ref 44–60)
pH, Ven: 7.401 (ref 7.25–7.43)
pH, Ven: 7.389 (ref 7.25–7.43)
pH, Ven: 7.402 (ref 7.25–7.43)
pO2, Ven: 36 mmHg (ref 32–45)
pO2, Ven: 33 mmHg (ref 32–45)
pO2, Ven: 38 mmHg (ref 32–45)

## 2022-04-16 SURGERY — RIGHT HEART CATH
Anesthesia: LOCAL

## 2022-04-16 MED ORDER — ONDANSETRON HCL 4 MG/2ML IJ SOLN: 4.0000 mg | Freq: Four times a day (QID) | INTRAMUSCULAR | Status: DC | PRN

## 2022-04-16 MED ORDER — LABETALOL HCL 5 MG/ML IV SOLN: 10.0000 mg | INTRAVENOUS | Status: DC | PRN

## 2022-04-16 MED ORDER — HYDRALAZINE HCL 20 MG/ML IJ SOLN: 10.0000 mg | INTRAMUSCULAR | Status: DC | PRN

## 2022-04-16 MED ORDER — MIDAZOLAM HCL 2 MG/2ML IJ SOLN
INTRAMUSCULAR | Status: AC
  Filled 2022-04-16: qty 2

## 2022-04-16 MED ORDER — HEPARIN (PORCINE) IN NACL 1000-0.9 UT/500ML-% IV SOLN
INTRAVENOUS | Status: AC
  Filled 2022-04-16: qty 500

## 2022-04-16 MED ORDER — HEPARIN (PORCINE) IN NACL 1000-0.9 UT/500ML-% IV SOLN
INTRAVENOUS | Status: DC | PRN
  Administered 2022-04-16: 500 mL

## 2022-04-16 MED ORDER — MIDAZOLAM HCL 2 MG/2ML IJ SOLN
INTRAMUSCULAR | Status: DC | PRN
  Administered 2022-04-16: 1 mg via INTRAVENOUS

## 2022-04-16 MED ORDER — ACETAMINOPHEN 325 MG PO TABS: 650.0000 mg | ORAL_TABLET | ORAL | Status: DC | PRN

## 2022-04-16 MED ORDER — LIDOCAINE HCL (PF) 1 % IJ SOLN
INTRAMUSCULAR | Status: DC | PRN
  Administered 2022-04-16: 2 mL

## 2022-04-16 MED ORDER — SODIUM CHLORIDE 0.9% FLUSH: 3.0000 mL | INTRAVENOUS | Status: DC | PRN

## 2022-04-16 MED ORDER — SODIUM CHLORIDE 0.9% FLUSH: 3.0000 mL | Freq: Two times a day (BID) | INTRAVENOUS | Status: DC

## 2022-04-16 MED ORDER — SODIUM CHLORIDE 0.9 % IV SOLN: 250.0000 mL | INTRAVENOUS | Status: DC | PRN

## 2022-04-16 MED ORDER — SODIUM CHLORIDE 0.9 % IV SOLN: INTRAVENOUS | Status: DC

## 2022-04-16 SURGICAL SUPPLY — 5 items
CATH BALLN WEDGE 5F 110CM (CATHETERS) ×1 IMPLANT
GUIDEWIRE .025 260CM (WIRE) ×1 IMPLANT
PACK CARDIAC CATHETERIZATION (CUSTOM PROCEDURE TRAY) ×2 IMPLANT
SHEATH GLIDE SLENDER 4/5FR (SHEATH) ×1 IMPLANT
TRANSDUCER W/STOPCOCK (MISCELLANEOUS) ×2 IMPLANT

## 2022-04-16 NOTE — Interval H&P Note (Signed)
History and Physical Interval Note:  04/16/2022 10:09 AM  [name] [name]  has presented today for surgery, with the diagnosis of hf/PAH  The various methods of treatment have been discussed with the patient and family. After consideration of risks, benefits and other options for treatment, the patient has consented to  Procedure(s): RIGHT HEART CATH (N/A) as a surgical intervention.  The patient's history has been reviewed, patient examined, no change in status, stable for surgery.  I have reviewed the patient's chart and labs.  Questions were answered to the patient's satisfaction.     Drezden Seitzinger

## 2022-04-18 ENCOUNTER — Telehealth (HOSPITAL_COMMUNITY): Payer: Self-pay | Admitting: *Deleted

## 2022-04-18 NOTE — Telephone Encounter (Signed)
Received fax from [facility], pt is being considered for ESI and they need clearance to hold Xarelto for 3 days prior to injection.  Per Dr Haroldine Laws: "pt may hold prior to procedure as requested"  Form faxed back to them at [phone]

## 2022-05-10 ENCOUNTER — Encounter (HOSPITAL_COMMUNITY): Payer: Medicare HMO

## 2022-05-15 ENCOUNTER — Encounter (HOSPITAL_COMMUNITY): Payer: Medicare HMO

## 2022-07-04 ENCOUNTER — Non-Acute Institutional Stay (HOSPITAL_COMMUNITY)
Admission: RE | Admit: 2022-07-04 | Discharge: 2022-07-04 | Disposition: A | Discharge status: Home / Self Care | Payer: Medicare HMO | Source: Ambulatory Visit | Attending: Internal Medicine | Admitting: Internal Medicine

## 2022-07-04 DIAGNOSIS — Z452 Encounter for adjustment and management of vascular access device: Secondary | ICD-10-CM | POA: Diagnosis present

## 2022-07-04 MED ORDER — SODIUM CHLORIDE 0.9% FLUSH
10.0000 mL | INTRAVENOUS | Status: AC | PRN
  Administered 2022-07-04: 10 mL

## 2022-07-04 MED ORDER — HEPARIN SOD (PORK) LOCK FLUSH 100 UNIT/ML IV SOLN
500.0000 [IU] | INTRAVENOUS | Status: AC | PRN
  Administered 2022-07-04: 500 [IU]
  Filled 2022-07-04: qty 5

## 2022-07-04 NOTE — Progress Notes (Signed)
PATIENT CARE CENTER NOTE       Provider: Cipriano Mile, NP     Procedure: Port-a-cath flush (dose # 5 of 11)     Note: Port-a-cath accessed using sterile technique. Patient tolerated well. PAC flushed with Heparin and 0.9% NS  and deaccessed. Band-aid placed over site. Patient to come monthly for flushes. Patient alert, oriented and ambulatory at discharge.

## 2022-07-12 ENCOUNTER — Ambulatory Visit: Payer: Medicare HMO | Admitting: Pulmonary Disease

## 2022-07-15 ENCOUNTER — Telehealth (HOSPITAL_COMMUNITY): Payer: Self-pay | Admitting: Pharmacy Technician

## 2022-07-15 NOTE — Telephone Encounter (Signed)
Advanced Heart Failure Patient Advocate Encounter  Patient left vm regarding Adempas assistance renewal. Called and spoke with the patient. She will fill her portion in and bring the application to the office. We will submit the application after received.   Will follow up.

## 2022-08-05 ENCOUNTER — Encounter (HOSPITAL_COMMUNITY): Payer: Medicare HMO

## 2022-08-07 ENCOUNTER — Non-Acute Institutional Stay (HOSPITAL_COMMUNITY)
Admission: RE | Admit: 2022-08-07 | Discharge: 2022-08-07 | Disposition: A | Discharge status: Home / Self Care | Payer: Medicare HMO | Source: Ambulatory Visit | Attending: Internal Medicine | Admitting: Internal Medicine

## 2022-08-07 DIAGNOSIS — Z452 Encounter for adjustment and management of vascular access device: Secondary | ICD-10-CM | POA: Diagnosis present

## 2022-08-07 MED ORDER — HEPARIN SOD (PORK) LOCK FLUSH 100 UNIT/ML IV SOLN
500.0000 [IU] | INTRAVENOUS | Status: AC | PRN
  Administered 2022-08-07: 500 [IU]
  Filled 2022-08-07: qty 5

## 2022-08-07 MED ORDER — SODIUM CHLORIDE 0.9% FLUSH
10.0000 mL | INTRAVENOUS | Status: AC | PRN
  Administered 2022-08-07: 10 mL

## 2022-08-07 NOTE — Progress Notes (Signed)
PATIENT CARE CENTER NOTE:  Provider: Cipriano Mile     Procedure: Port-a-cath flush ( dose #6 of 11)     Note: Patient's PAC accessed using sterile technique, flushed with 10 cc 0.9% sodium chloride and heparin. Blood return noted. PAC de-accessed and site covered with band aid. Patient tolerated well. Patient declined AVS, instructed pt to schedule monthly PAC flush, verbalized understanding. Alert, oriented and ambulatory with cane at discharge.

## 2022-08-13 NOTE — Telephone Encounter (Signed)
Advanced Heart Failure Patient Advocate Encounter  Sent in application via fax.  Will follow up.

## 2022-09-20 ENCOUNTER — Other Ambulatory Visit (HOSPITAL_COMMUNITY): Payer: Self-pay | Admitting: *Deleted

## 2022-09-20 ENCOUNTER — Telehealth (HOSPITAL_COMMUNITY): Payer: Self-pay | Admitting: Pharmacy Technician

## 2022-09-20 MED ORDER — RIVAROXABAN 20 MG PO TABS: 20.0000 mg | ORAL_TABLET | Freq: Every day | ORAL | 11 refills | Status: DC

## 2022-09-20 NOTE — Telephone Encounter (Signed)
Advanced Heart Failure Patient Advocate Encounter  Patient left vm stating her Xarelto RX at [facility] had expired. Requested another RX to [facility] and samples. Called and spoke with the patient. Sent 30 day RX request to [facility] ([place] to send to [facility]. Samples will be provided as well.  Charlann Boxer, CPhT

## 2022-09-24 ENCOUNTER — Ambulatory Visit: Payer: Medicare HMO | Admitting: Orthopaedic Surgery

## 2022-09-24 ENCOUNTER — Telehealth (HOSPITAL_COMMUNITY): Payer: Self-pay | Admitting: *Deleted

## 2022-09-24 NOTE — Telephone Encounter (Signed)
Opened in Error

## 2022-09-26 ENCOUNTER — Other Ambulatory Visit: Payer: Self-pay | Admitting: Student

## 2022-09-26 DIAGNOSIS — Z1231 Encounter for screening mammogram for malignant neoplasm of breast: Secondary | ICD-10-CM

## 2022-10-01 ENCOUNTER — Other Ambulatory Visit (HOSPITAL_COMMUNITY): Payer: Self-pay

## 2022-10-01 ENCOUNTER — Other Ambulatory Visit (HOSPITAL_COMMUNITY): Payer: Self-pay | Admitting: *Deleted

## 2022-10-01 ENCOUNTER — Telehealth (HOSPITAL_COMMUNITY): Payer: Self-pay | Admitting: Pharmacy Technician

## 2022-10-01 MED ORDER — SPIRONOLACTONE 25 MG PO TABS: 12.5000 mg | ORAL_TABLET | ORAL | 3 refills | Status: DC

## 2022-10-01 NOTE — Telephone Encounter (Signed)
Advanced Heart Failure Patient Advocate Encounter  Prior Authorization for Adempas has been approved.    PA# [id number] Effective dates: 09/09/22 through 09/09/23  Charlann Boxer, CPhT

## 2022-10-01 NOTE — Telephone Encounter (Signed)
Advanced Heart Failure Patient Advocate Encounter  Called [employer] to check the status of the patient's application. Representative stated that they are still waiting for the insurance verification to be completed on their end. Confirmed that we have done everything on our end. I did inquire when the last time the patient got a shipment. The representative stated that the patient got her last shipment at the end of December. I asked for an expedited review, as the end of the month is quickly approaching.  Called and updated the patient as well.

## 2022-10-01 NOTE — Telephone Encounter (Signed)
Patient Advocate Encounter   Received notification from [facility] that prior authorization for Adempas is required.   PA submitted on CoverMyMeds Key [id number] Status is pending   Will continue to follow.

## 2022-10-04 ENCOUNTER — Other Ambulatory Visit (HOSPITAL_COMMUNITY): Payer: Self-pay

## 2022-10-04 ENCOUNTER — Telehealth (HOSPITAL_COMMUNITY): Payer: Self-pay

## 2022-10-04 MED ORDER — ADEMPAS 2 MG PO TABS: 2.0000 mg | ORAL_TABLET | Freq: Three times a day (TID) | ORAL | 11 refills | Status: DC

## 2022-10-04 MED ORDER — ADEMPAS 2 MG PO TABS: 2.0000 mg | ORAL_TABLET | Freq: Three times a day (TID) | ORAL | 3 refills | Status: DC

## 2022-10-04 NOTE — Telephone Encounter (Signed)
Advanced Heart Failure Patient Advocate Encounter  [employer] has not been able to give a realistic timeline on when the patient's application will be approved or if they will be able to ship the patient a temporary supply while they finish reviewing her application.   The patient was approved for a PAN grant that will help cover the cost of Adempas.  Member ID: [id number] Group ID: [id number] RxBin ID: [id number] PCN: [id number] Eligibility Start Date: 07/06/2022 Eligibility End Date: 10/04/2023 Assistance Amount: $3,250.00  Sent in [facility] via fax in the hopes to get medication triaged to [facility] quickly. Called and spoke with the patient. She is aware I will call her back as soon as I know anything. At this time, she has enough for 7 days.

## 2022-10-04 NOTE — Telephone Encounter (Signed)
[name] [name] called and stated her pharmacy called and told her the prescription cost for her is $3000, even after you told me the prior auth had been approved. Can you look into this?

## 2022-10-04 NOTE — Telephone Encounter (Signed)
Which medication, the Adempas?

## 2022-10-08 ENCOUNTER — Other Ambulatory Visit (HOSPITAL_COMMUNITY): Payer: Self-pay

## 2022-10-08 NOTE — Telephone Encounter (Signed)
Advanced Heart Failure Patient Advocate Encounter  Spoke with [place] with [facility]. They have all the necessary information and have counseled the patient. She will get her first shipment from them tomorrow. Confirmed they have grant information on file and co-pay was $0.  Charlann Boxer, CPhT

## 2022-10-10 ENCOUNTER — Other Ambulatory Visit (HOSPITAL_COMMUNITY): Payer: Self-pay

## 2022-10-10 ENCOUNTER — Telehealth (HOSPITAL_COMMUNITY): Payer: Self-pay | Admitting: Pharmacy Technician

## 2022-10-10 NOTE — Telephone Encounter (Signed)
Advanced Heart Failure Patient Advocate Encounter  Patient left vm asking for help with Xarelto. [facility] won't let her refill through their discount program until April. Now that Adempas has been filled with a grant, the patient's updated Xarelto co-pay is $0. This will remain in effect the rest of the year. Called and spoke with the patient. She is aware we will not need to use [facility] this year. Sent 90 day RX request to [facility] ([place]) to send to [facility].  Charlann Boxer, CPhT

## 2022-10-11 ENCOUNTER — Other Ambulatory Visit (HOSPITAL_COMMUNITY): Payer: Self-pay

## 2022-10-11 ENCOUNTER — Other Ambulatory Visit: Payer: Self-pay

## 2022-10-11 ENCOUNTER — Other Ambulatory Visit (HOSPITAL_COMMUNITY): Payer: Self-pay | Admitting: *Deleted

## 2022-10-11 MED ORDER — RIVAROXABAN 20 MG PO TABS
20.0000 mg | ORAL_TABLET | Freq: Every day | ORAL | 3 refills | Status: DC
  Filled 2022-10-11: qty 90, 90d supply, fill #0
  Filled 2022-12-20 – 2022-12-25 (×3): qty 90, 90d supply, fill #1
  Filled 2023-04-05: qty 90, 90d supply, fill #2
  Filled 2023-06-27: qty 90, 90d supply, fill #3

## 2022-10-24 ENCOUNTER — Other Ambulatory Visit: Payer: Self-pay | Admitting: Student

## 2022-10-24 DIAGNOSIS — N644 Mastodynia: Secondary | ICD-10-CM

## 2022-10-25 ENCOUNTER — Telehealth (HOSPITAL_COMMUNITY): Payer: Self-pay | Admitting: Licensed Clinical Social Worker

## 2022-10-25 NOTE — Telephone Encounter (Signed)
H&V Care Navigation CSW Progress Note  Clinical Social Worker received call from pt requesting help with obtaining medical equipment as she is unable to afford it.  Pt has insurance but states she called them and they would not cover the new equipment- sounds as if she is trying to replace too soon?  CSW informed pt we could check with [facility] which has access to donated equipment- she is agreeable.  CSW sent message to [employer] to see if they have a 3in1 potty chair and rollater available- awaiting response.   SDOH Screenings   Depression (PHQ2-9): Medium Risk (11/28/2021)  Financial Resource Strain: High Risk (10/25/2022)  Tobacco Use: Low Risk  (04/16/2022)   [name] Ny, LCSW Clinical Social Worker [facility] Desk#: [phone] Cell#: [phone]

## 2022-10-28 ENCOUNTER — Telehealth (HOSPITAL_COMMUNITY): Payer: Self-pay | Admitting: Licensed Clinical Social Worker

## 2022-10-28 NOTE — Telephone Encounter (Signed)
H&V Care Navigation CSW Progress Note  Clinical Social Worker reached out to patient to follow up regarding referral for free DME.  [facility] did not have the requested equipment available so CSW asked pt permission to refer to [employer]- pt provided permission and referral was placed- they will reach out to pt to discuss available equipment and how patient can pick up.  SDOH Screenings   Depression (PHQ2-9): Medium Risk (11/28/2021)  Financial Resource Strain: High Risk (10/25/2022)  Tobacco Use: Low Risk  (04/16/2022)   Jorge Ny, LCSW Clinical Social Worker [facility] Desk#: [phone] Cell#: [phone]

## 2022-10-28 NOTE — Progress Notes (Signed)
Pulmonary Hypertension Clinic Note  Date:  10/28/2022   ID:  [name], [name] March 19, 1961, MRN [id number]  PCP:  [name] Dose, MD  HF MD: Dr Haroldine Laws    HPI: [name] [name] is a 62 y.o. 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   In [place] in 09/2015 diagnosed with saddle PE and started on anticoagulation. She was very briefly on warfarin but quickly switched to Xarelto.   She was referred to [facility] in 4/17. Echocardiogram showed normal LVEF 55-60%, normal LA, normal RV size, RVSP estimated as 70 mm Hg. She further was referred to [facility] and saw Dr. Karena Addison on 03/14/2016 where patient had several studies including V/Q scan which found high prob for PE; studies c/w CTEPH. It was felt that her chronic chest pain was caused by chronic PE. RHC/LHC/Pulm Angiogram 03/2016 confirmed diagnosis of CTEPH. She underwent pulmonary thromboembolectomy 03/31/2016 at [facility] with IVC filter placement. Pre-op cath with no CAD.   Seen by Dr. Karena Addison in 04/2016 for follow up. He felt like her heart size was decreasing. Plan was for lifelong [place] and ECHO and V/Q in 4 months.  CT angio at [name] on 05/15/16 showed " persistent but partially recanalized PE within right lower lobe pulmonary artery. No new PE identified. "  She was admitted to [facility] in 06/2016 for continued chest pain and parasthesias. V/Q scan on 06/27/16 showed prominent ventilation perfusion mismatch in right c/w chronic PE. Head CT negative. She was continued on Xarelto.  Admitted to [facility] for suicidal ideation. She was then sent to [facility] for additional treatment.   Had repeat RHC in 10/20 which showed mild PAH (50/16 (28)) with high output (8.1l/min) and normal PVR (2.0 WU). Not candidate for additional selective pulmonary artery vasodialtors    Sleep study 9/22: AHI 4.4  In 11/21 had worsening symptoms. RHC unchanged PA 52/18 (30). PVR 2.8  Remains on Adempas 2 mg tid.   Last OV, 2/23, reported NYHA Class III symptoms w/ stable volume status. Was continued Adempas at 15m tid. BP was too low to add PDE-5. She was referred to pulmonary rehab and ordered to get repeat echo.   She returns today for f/u. Echo that was previously ordered has not been done yet. It appears she has canceled most of her pulmonary rehab sessions. She  is off Ozempic but has still lost a significant amount of wt since her last OV, down from 322 lb to 305 lb. Nevertheless, she feels her breathing is getting worse. Struggles with ADLs. NYHA II symptoms. Occasional edema. No CP. No syncope or presyncope.    [place]  RA = 8 RV = 49/10 PA = 52/18 (30) PCW = 12 Fick cardiac output/index = 6.5/2.7 PVR = 2.8 WU FA sat = 98% PA sat = 67%, 68% PaPi = 3.6  RHC 10/20 RA = 7 RV = 50/12 PA = 50/16 (28) PCW = 11 Fick cardiac output/index = 8.1/3.4 PVR = 2.0 WU FA sat = 99% PA sat = 72%, 77%  10/2016 RHC RA = 8 RV = 64/13 PA = 65/19 (35) PCW = 9 Fick cardiac output/index = 7.5/3.1 PVR = 3.5 WU Ao sat = 96% PA sat = 68%,70% SVC sat = 71%  Assessment:  1. Mild residual PAH in the setting of CTEPH s/p pulmonary thrombolectomy  2. Normal cardiac output 3. Normal left-sided filling pressures   Past Medical History:  Diagnosis Date   Anxiety    Arthritis    CHF exacerbation ([place]) 08/21/2017   Depression    Depression    Difficult intubation    See [facility] anesthesia records in Care Everywhere   Diverticulitis    Dyspnea    with exertion   Family history of adverse reaction to anesthesia    " My mother stopped breathing during procedure"   GERD (gastroesophageal reflux disease)    PMH; resolved   Hyperlipidemia    Hypertension    Obesity    Plantar fasciitis, left    Pneumonia    Port-A-Cath in place    Pre-diabetes    Pulmonary  embolism ([place])    Pulmonary hypertension ([place])    Sleep apnea    wears CPAP # 9   Wears glasses     Past Surgical History:  Procedure Laterality Date   ABDOMINAL HYSTERECTOMY     BIOPSY  02/26/2020   Procedure: BIOPSY;  Surgeon: Ronnette Juniper, MD;  Location: [facility] ENDOSCOPY;  Service: Gastroenterology;;   BREAST REDUCTION SURGERY     CHOLECYSTECTOMY     COLONOSCOPY W/ BIOPSIES AND POLYPECTOMY     EMBOLECTOMY N/A    pulmonary embolectomy   ESOPHAGOGASTRODUODENOSCOPY N/A 02/26/2020   Procedure: ESOPHAGOGASTRODUODENOSCOPY (EGD);  Surgeon: Ronnette Juniper, MD;  Location: [place];  Service: Gastroenterology;  Laterality: N/A;   FRACTURE SURGERY     right ankle   GASTROCNEMIUS RECESSION Left 02/12/2019   Procedure: LEFT GASTROCNEMIUS RECESSION, PLANTAR FASCIA RELEASE;  Surgeon: Newt Minion, MD;  Location: [place];  Service: Orthopedics;  Laterality: Left;   IVC FILTER INSERTION     REDUCTION MAMMAPLASTY Bilateral    RIGHT HEART CATH N/A 10/28/2016   Procedure: Right Heart Cath;  Surgeon: Jolaine Artist, MD;  Location: [place] CV LAB;  Service: Cardiovascular;  Laterality: N/A;   RIGHT HEART CATH N/A 08/26/2017   Procedure: RIGHT HEART CATH;  Surgeon: Jolaine Artist, MD;  Location: [place] CV LAB;  Service: Cardiovascular;  Laterality: N/A;   RIGHT HEART CATH N/A 07/05/2019   Procedure: RIGHT HEART CATH;  Surgeon: Jolaine Artist, MD;  Location: [place] CV LAB;  Service: Cardiovascular;  Laterality: N/A;   RIGHT HEART CATH N/A 07/24/2020   Procedure: RIGHT HEART CATH;  Surgeon: Jolaine Artist, MD;  Location: [place] CV LAB;  Service: Cardiovascular;  Laterality: N/A;   RIGHT HEART CATH N/A 04/16/2022   Procedure: RIGHT HEART CATH;  Surgeon: Jolaine Artist, MD;  Location: [place] CV LAB;  Service: Cardiovascular;  Laterality: N/A;   ULTRASOUND GUIDANCE FOR VASCULAR ACCESS  08/26/2017   Procedure: Ultrasound Guidance For Vascular Access;  Surgeon:  Jolaine Artist, MD;  Location: [place] CV LAB;  Service: Cardiovascular;;   WISDOM TOOTH EXTRACTION      Current Medications:  Current Outpatient Medications on File Prior to Visit  Medication Sig Dispense Refill   albuterol (VENTOLIN HFA) 108 (90 Base) MCG/ACT inhaler Inhale 2 puffs into the lungs every 6 (six) hours as needed for wheezing or shortness of breath.     atorvastatin (LIPITOR) 20 MG tablet Take 1 tablet (20 mg total) by mouth daily at 6 PM. 90 tablet 1   Azelastine HCl 137 MCG/SPRAY SOLN Place 1 spray into both nostrils daily as needed (allergies).     docusate sodium (COLACE) 100 MG capsule Take 1 capsule (100 mg  total) by mouth every 12 (twelve) hours. 30 capsule 0   furosemide (LASIX) 40 MG tablet TAKE 1 TABLET BY MOUTH ONCE DAILY. TAKE EXTRA 40 MG TABLET ONCE IN THE AFTERNOON IF NEEDED FOR WEIGHT GAIN OF 3 LBS OR MORE 180 tablet 3   gabapentin (NEURONTIN) 300 MG capsule Take 300 mg by mouth 3 (three) times daily.     HYDROcodone-acetaminophen (NORCO) 10-325 MG tablet Take 1 tablet by mouth 3 (three) times daily as needed for moderate pain.     lidocaine (LIDODERM) 5 % Place 1 patch onto the skin daily. Remove & Discard patch within 12 hours or as directed by MD (Patient taking differently: Place 1 patch onto the skin daily as needed (pain). Remove & Discard patch within 12 hours or as directed by MD) 5 patch 0   methocarbamol (ROBAXIN) 500 MG tablet Take 500 mg by mouth every 8 (eight) hours as needed for muscle pain.     mirtazapine (REMERON) 15 MG tablet Take 15 mg by mouth at bedtime.     montelukast (SINGULAIR) 10 MG tablet Take 10 mg by mouth every evening.     Multiple Vitamin (MULTIVITAMIN ADULT PO) Take 1 tablet by mouth daily in the afternoon.     omeprazole (PRILOSEC) 40 MG capsule Take 40 mg by mouth 2 (two) times daily.     ondansetron (ZOFRAN) 4 MG tablet Take 4 mg by mouth every 8 (eight) hours as needed for nausea/vomiting.     OXYGEN Inhale 2 L into  the lungs See admin instructions. At bedtime and during the day when resting     polyethylene glycol (MIRALAX / GLYCOLAX) 17 g packet Take 17 g by mouth daily.     potassium chloride (KLOR-CON) 10 MEQ tablet Take 10 mEq by mouth every morning.     QUEtiapine (SEROQUEL) 300 MG tablet Take 300 mg by mouth at bedtime.     Riociguat (ADEMPAS) 2 MG TABS Take 2 mg by mouth 3 (three) times daily. 30 tablet 11   rivaroxaban (XARELTO) 20 MG TABS tablet Take 1 tablet (20 mg total) by mouth daily with supper. 90 tablet 3   spironolactone (ALDACTONE) 25 MG tablet Take 0.5 tablets (12.5 mg total) by mouth every other day. 45 tablet 3   zolpidem (AMBIEN) 5 MG tablet Take 5 mg by mouth at bedtime as needed for sleep.     No current facility-administered medications on file prior to visit.    Allergies:   [name].   Social History   Socioeconomic History   Marital status: Married    Spouse name: Not on file   Number of children: Not on file   Years of education: Not on file   Highest education level: 12th grade  Occupational History   Occupation: Disability  Tobacco Use   Smoking status: Never   Smokeless tobacco: Never  Vaping Use   Vaping Use: Never used  Substance and Sexual Activity   Alcohol use: No   Drug use: No   Sexual activity: Yes    Partners: Male    Birth control/protection: Surgical  Other Topics Concern   Not on file  Social History Narrative   Not on file   Social Determinants of Health   Financial Resource Strain: High Risk (10/25/2022)   Overall Financial Resource Strain (CARDIA)    Difficulty of Paying Living Expenses: Hard  Food Insecurity: Not on file  Transportation Needs: Not on file  Physical Activity: Not on  file  Stress: Not on file  Social Connections: Not on file    Family History:  The patient's family history includes Anxiety disorder in her cousin, sister, and sister; Bipolar disorder in her sister and sister; Dementia in her  father and mother; Drug abuse in her sister and sister; Lung cancer in her maternal grandmother; Sexual abuse in her maternal aunt.     Review of systems complete and found to be negative unless listed in HPI.   PHYSICAL EXAM:    There were no vitals filed for this visit.    Wt Readings from Last 3 Encounters:  04/16/22 (!) 138.3 kg (305 lb)  04/03/22 (!) 138.3 kg (305 lb)  03/01/22 (!) 154 kg (339 lb 8.1 oz)    PHYSICAL EXAM: General:  obese woman No resp difficulty HEENT: normal Neck: supple. No obvious  JVD. Carotids 2+ bilat; no bruits. No lymphadenopathy or thryomegaly appreciated. Cor: PMI nondisplaced. Regular rate & rhythm. No rubs, gallops or murmurs. Lungs: clear Abdomen: obese soft, nontender, nondistended. No hepatosplenomegaly. No bruits or masses. Good bowel sounds. Extremities: no cyanosis, clubbing, rash, edema Neuro: alert & orientedx3, cranial nerves grossly intact. moves all 4 extremities w/o difficulty. Affect pleasant    Studies/Labs Reviewed:   Recent Labs: 11/07/2021: ALT 12; TSH 0.405 02/14/2022: Magnesium 1.9 04/09/2022: B Natriuretic Peptide 24.3; BUN 13; Creatinine, Ser 0.87; Platelets 339 04/16/2022: Hemoglobin 11.9; Potassium 3.7; Sodium 144   Lipid Panel    Component Value Date/Time   CHOL 102 11/07/2021 1436   TRIG 66 11/07/2021 1436   HDL 28 (L) 11/07/2021 1436   CHOLHDL 3.6 11/07/2021 1436   VLDL 13 11/07/2021 1436   LDLCALC 61 11/07/2021 1436    Additional studies/ records that were reviewed today include:   ECHO 8/18 EF 55-60%  RV normal RVSP 29mHG   2D ECHO: 12/15/2015 LV EF: 55% -   60% Study Conclusions - Left ventricle: The cavity size was normal. Wall thickness was normal. Systolic function was normal. The estimated ejection  fraction was in the range of 55% to 60%. - Pulmonary arteries: PA peak pressure: 70 mm Hg (S). - Pericardium, extracardiac: A trivial pericardial effusion was   identified.    2D ECHO  04/05/16 INTERPRETATION: NORMAL LEFT VENTRICULAR FUNCTION WITH MILD LVH SEVERE RV SYSTOLIC DYSFUNCTION (See above) VALVULAR REGURGITATION: TRIVIAL PR, MILD TR NO VALVULAR STENOSIS TRIVIAL PERICARDIAL EFFUSION Compared with prior Echo study on 03/14/2016: RVSP increased from 50 to 69   CATH right heart and coronary angiography: 03/25/2016 DOdumComponent Name Value Ref Range  Cardiac Index (l/min/m2) 2.3 L/min/m2  Right Atrium Mean Pressure (mmHg) 11 mmHg   Right Ventricle Systolic Pressure (mmHg) 68 mmHg   Pulmonary Artery Mean Pressure (mmHg) 40 mmHg   Pulmonary Wedge Pressure (mmHg) 14 mmHg   Pulmonary Vascular Resistance ([place] units) 4.7    CT angio 05/15/16 IMPRESSION: Persistent but partially recanalized pulmonary embolus within the right lower lobe pulmonary artery. No new pulmonary embolism is identified. Minimal bibasilar atelectatic changes stable from the prior exam.     V/Q scan 06/27/16 IMPRESSION: Prominent ventilation perfusion mismatch noted on the right. Finding suggests high probability right-sided pulmonary embolus in this patient with known right-sided pulmonary embolus.    ASSESSMENT & PLAN:    1. 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  No bleeding  - RHC 12/18 with very mild PAH and PVR 3.1 WU - Echo 10/19 shows EF 55-60% RV not well seen but looks ok No septal flattening  - Echo 9/20 LVEF 65% RV dilated with normal function. Septum flat. Personally reviewed - RHC 10/20 mild PAH 50/16 (28) with high output (8.1l/min) and normal PVR (2.0 WU). Not candidate for additional selective pulmonary artery vasodilators. - RHC 11/21 PA 52/18 (30) PCW 12 PVR 2.8 WU - Symptomatically worse today NYHA III-IIIB symptoms - Volume status looks ok. On lasix 40 daily and spiro 12.5   - Continue adempas at 19m tid and Xarelto - Unable to do 6MW - Will repeat RHC and echo  - Unable to tolerate Pulmonary Rehab  2. Chronic chest pain:  - Has chronic PE s[facility]p thrombectomy[facility].  - Resolved  3. Palpitations - ZIO 2/23 5 runs SVT. Rare PVCS  4. Fibromyalgia/Rheumatoid Arthitis:  - Follows with Rheumatology. No change.    5. Morbid obesity with OHS:  - There is no height or weight on file to calculate BMI.  - Off Ozempic but continues to lose weight  6. OSA - Followed by Dr. SHalford Chessman- now off CPAP. Wearing O2 at night - Sleep study with AHI 4.4   7. Depression/anxiety - Receiving outpatient therapy.   - Per PCP   JRafael Bihari FNP  8:20 AM

## 2022-10-29 ENCOUNTER — Encounter (HOSPITAL_COMMUNITY): Payer: Self-pay

## 2022-10-29 ENCOUNTER — Ambulatory Visit (HOSPITAL_COMMUNITY)
Admission: RE | Admit: 2022-10-29 | Discharge: 2022-10-29 | Disposition: A | Discharge status: Home / Self Care | Payer: Medicare PPO | Source: Ambulatory Visit | Attending: Family Medicine | Admitting: Family Medicine

## 2022-10-29 VITALS — BP 110/74 | HR 66 | Ht 63.0 in | Wt 308.0 lb

## 2022-10-29 DIAGNOSIS — F32A Depression, unspecified: Secondary | ICD-10-CM | POA: Insufficient documentation

## 2022-10-29 DIAGNOSIS — R002 Palpitations: Secondary | ICD-10-CM | POA: Diagnosis not present

## 2022-10-29 DIAGNOSIS — G894 Chronic pain syndrome: Secondary | ICD-10-CM | POA: Diagnosis not present

## 2022-10-29 DIAGNOSIS — I2782 Chronic pulmonary embolism: Secondary | ICD-10-CM | POA: Insufficient documentation

## 2022-10-29 DIAGNOSIS — I2699 Other pulmonary embolism without acute cor pulmonale: Secondary | ICD-10-CM | POA: Diagnosis not present

## 2022-10-29 DIAGNOSIS — M069 Rheumatoid arthritis, unspecified: Secondary | ICD-10-CM | POA: Diagnosis not present

## 2022-10-29 DIAGNOSIS — M797 Fibromyalgia: Secondary | ICD-10-CM | POA: Diagnosis not present

## 2022-10-29 DIAGNOSIS — Z7901 Long term (current) use of anticoagulants: Secondary | ICD-10-CM | POA: Insufficient documentation

## 2022-10-29 DIAGNOSIS — I471 Supraventricular tachycardia, unspecified: Secondary | ICD-10-CM | POA: Insufficient documentation

## 2022-10-29 DIAGNOSIS — I2724 Chronic thromboembolic pulmonary hypertension: Secondary | ICD-10-CM | POA: Diagnosis not present

## 2022-10-29 DIAGNOSIS — G8929 Other chronic pain: Secondary | ICD-10-CM

## 2022-10-29 DIAGNOSIS — R079 Chest pain, unspecified: Secondary | ICD-10-CM | POA: Diagnosis not present

## 2022-10-29 DIAGNOSIS — R45851 Suicidal ideations: Secondary | ICD-10-CM | POA: Diagnosis not present

## 2022-10-29 DIAGNOSIS — F419 Anxiety disorder, unspecified: Secondary | ICD-10-CM | POA: Diagnosis not present

## 2022-10-29 DIAGNOSIS — G4733 Obstructive sleep apnea (adult) (pediatric): Secondary | ICD-10-CM | POA: Insufficient documentation

## 2022-10-29 DIAGNOSIS — R0789 Other chest pain: Secondary | ICD-10-CM | POA: Diagnosis not present

## 2022-10-29 DIAGNOSIS — I110 Hypertensive heart disease with heart failure: Secondary | ICD-10-CM | POA: Insufficient documentation

## 2022-10-29 DIAGNOSIS — R1900 Intra-abdominal and pelvic swelling, mass and lump, unspecified site: Secondary | ICD-10-CM | POA: Insufficient documentation

## 2022-10-29 DIAGNOSIS — I509 Heart failure, unspecified: Secondary | ICD-10-CM | POA: Insufficient documentation

## 2022-10-29 DIAGNOSIS — Z6841 Body Mass Index (BMI) 40.0 and over, adult: Secondary | ICD-10-CM | POA: Insufficient documentation

## 2022-10-29 DIAGNOSIS — Z79899 Other long term (current) drug therapy: Secondary | ICD-10-CM | POA: Diagnosis not present

## 2022-10-29 DIAGNOSIS — I272 Pulmonary hypertension, unspecified: Secondary | ICD-10-CM | POA: Diagnosis not present

## 2022-10-29 NOTE — Progress Notes (Signed)
Six minute walk completed Pt ambulated ~780 ft (237.65m without difficulty or rest breaks HR ranged 83-128 O2 Sats ranged 95-99% on room air

## 2022-10-29 NOTE — Patient Instructions (Addendum)
Thank you for coming in today  EKG was done today  You had a 6 minute walked done today in clinic  CHANGE Spironolactone to 12.5 mg 1/2 tablet daily   Your physician recommends that you schedule a follow-up appointment in:  6 months with Dr. Haroldine Laws with echocardiogram You will receive a reminder letter in the mail a few months in advance. If you don't receive a letter, please call our office to schedule the follow-up appointment.  Your physician has requested that you have an echocardiogram. Echocardiography is a painless test that uses sound waves to create images of your heart. It provides your doctor with information about the size and shape of your heart and how well your heart's chambers and valves are working. This procedure takes approximately one hour. There are no restrictions for this procedure.   Your physician recommends that you return for lab work in:  You were given a prescription for labs to be drawn please have them fax the results to our clinic [phone]     Do the following things EVERYDAY: Weigh yourself in the morning before breakfast. Write it down and keep it in a log. Take your medicines as prescribed Eat low salt foods--Limit salt (sodium) to 2000 mg per day.  Stay as active as you can everyday Limit all fluids for the day to less than 2 liters  At the [facility], you and your health needs are our priority. As part of our continuing mission to provide you with exceptional heart care, we have created designated Provider Care Teams. These Care Teams include your primary Cardiologist (physician) and Advanced Practice Providers (APPs- Physician Assistants and Nurse Practitioners) who all work together to provide you with the care you need, when you need it.   You may see any of the following providers on your designated Care Team at your next follow up: Dr Glori Bickers Dr Loralie Champagne Dr. Roxana Hires, NP Lyda Jester, [place] [facility] [place], [place] Forestine Na, NP Audry Riles, PharmD   Please be sure to bring in all your medications bottles to every appointment.    Thank you for choosing [facility]   If you have any questions or concerns before your next appointment please send [place] a message through [place] or call our office at [phone].    TO LEAVE A MESSAGE FOR THE NURSE SELECT OPTION 2, PLEASE LEAVE A MESSAGE INCLUDING: YOUR NAME DATE OF BIRTH CALL BACK NUMBER REASON FOR CALL**this is important as we prioritize the call backs  YOU WILL RECEIVE A CALL BACK THE SAME DAY AS LONG AS YOU CALL BEFORE 4:00 PM

## 2022-11-11 ENCOUNTER — Encounter (HOSPITAL_COMMUNITY): Payer: Medicare PPO

## 2022-11-12 ENCOUNTER — Telehealth (HOSPITAL_COMMUNITY): Payer: Self-pay

## 2022-11-12 NOTE — Telephone Encounter (Signed)
Patient called and asked about help with raised toilet seat and shower chair. The one place did not have and she has not heard from anyone else. Can you check in with her?

## 2022-11-22 ENCOUNTER — Non-Acute Institutional Stay (HOSPITAL_COMMUNITY)
Admission: RE | Admit: 2022-11-22 | Discharge: 2022-11-22 | Disposition: A | Discharge status: Home / Self Care | Payer: Medicare PPO | Source: Ambulatory Visit | Attending: Internal Medicine | Admitting: Internal Medicine

## 2022-11-22 ENCOUNTER — Other Ambulatory Visit: Payer: Self-pay | Admitting: Obstetrics and Gynecology

## 2022-11-22 DIAGNOSIS — Z452 Encounter for adjustment and management of vascular access device: Secondary | ICD-10-CM | POA: Insufficient documentation

## 2022-11-22 DIAGNOSIS — I509 Heart failure, unspecified: Secondary | ICD-10-CM | POA: Diagnosis not present

## 2022-11-22 DIAGNOSIS — N950 Postmenopausal bleeding: Secondary | ICD-10-CM

## 2022-11-22 DIAGNOSIS — I272 Pulmonary hypertension, unspecified: Secondary | ICD-10-CM

## 2022-11-22 LAB — CBC
HCT: 35.8 % — ABNORMAL LOW (ref 36.0–46.0)
Hemoglobin: 11.4 g/dL — ABNORMAL LOW (ref 12.0–15.0)
MCH: 21.3 pg — ABNORMAL LOW (ref 26.0–34.0)
MCHC: 31.8 g/dL (ref 30.0–36.0)
MCV: 66.9 fL — ABNORMAL LOW (ref 80.0–100.0)
Platelets: 325 10*3/uL (ref 150–400)
RBC: 5.35 MIL/uL — ABNORMAL HIGH (ref 3.87–5.11)
RDW: 18.6 % — ABNORMAL HIGH (ref 11.5–15.5)
WBC: 9.8 10*3/uL (ref 4.0–10.5)
nRBC: 0 % (ref 0.0–0.2)

## 2022-11-22 LAB — BASIC METABOLIC PANEL
Anion gap: 7 (ref 5–15)
BUN: 29 mg/dL — ABNORMAL HIGH (ref 6–20)
CO2: 26 mmol/L (ref 22–32)
Calcium: 9.9 mg/dL (ref 8.9–10.3)
Chloride: 106 mmol/L (ref 98–111)
Creatinine, Ser: 1.05 mg/dL — ABNORMAL HIGH (ref 0.44–1.00)
GFR, Estimated: >60 mL/min (ref >60)
Glucose, Bld: 100 mg/dL — ABNORMAL HIGH (ref 70–99)
Potassium: 4.2 mmol/L (ref 3.5–5.1)
Sodium: 139 mmol/L (ref 135–145)

## 2022-11-22 LAB — BRAIN NATRIURETIC PEPTIDE: B Natriuretic Peptide: 52.3 pg/mL (ref 0.0–100.0)

## 2022-11-22 MED ORDER — HEPARIN SOD (PORK) LOCK FLUSH 100 UNIT/ML IV SOLN
500.0000 [IU] | INTRAVENOUS | Status: AC | PRN
  Administered 2022-11-22: 500 [IU]
  Filled 2022-11-22: qty 5

## 2022-11-22 MED ORDER — SODIUM CHLORIDE 0.9% FLUSH
10.0000 mL | INTRAVENOUS | Status: AC | PRN
  Administered 2022-11-22: 10 mL

## 2022-11-22 NOTE — Progress Notes (Signed)
PATIENT CARE CENTER NOTE       Provider: Cipriano Mile, NP     Procedure: Port-a-cath flush (dose # 7 of 11) and lab draw     Note: Port-a-cath accessed using sterile technique. Patient tolerated well. Labs (CBC, BNP, BMP) drawn from port per Dr. Edwena Bunde order. PAC flushed with Heparin and 0.9% NS  and deaccessed. Band-aid placed over site. Patient to come monthly for flushes. Patient alert, oriented and ambulatory at discharge.

## 2022-12-02 ENCOUNTER — Other Ambulatory Visit (HOSPITAL_COMMUNITY): Payer: Self-pay | Admitting: Cardiology

## 2022-12-02 MED ORDER — SPIRONOLACTONE 25 MG PO TABS: 12.5000 mg | ORAL_TABLET | Freq: Every day | ORAL | 3 refills | Status: DC

## 2022-12-12 ENCOUNTER — Other Ambulatory Visit: Payer: Medicare PPO

## 2022-12-19 ENCOUNTER — Other Ambulatory Visit: Payer: Medicare PPO

## 2022-12-19 ENCOUNTER — Other Ambulatory Visit: Payer: Self-pay | Admitting: Student

## 2022-12-19 DIAGNOSIS — R918 Other nonspecific abnormal finding of lung field: Secondary | ICD-10-CM

## 2022-12-20 ENCOUNTER — Other Ambulatory Visit (HOSPITAL_COMMUNITY): Payer: Self-pay

## 2022-12-23 ENCOUNTER — Other Ambulatory Visit (HOSPITAL_COMMUNITY): Payer: Self-pay

## 2022-12-23 ENCOUNTER — Non-Acute Institutional Stay (HOSPITAL_COMMUNITY)
Admission: RE | Admit: 2022-12-23 | Discharge: 2022-12-23 | Disposition: A | Discharge status: Home / Self Care | Payer: Medicare PPO | Source: Ambulatory Visit | Attending: Internal Medicine | Admitting: Internal Medicine

## 2022-12-23 DIAGNOSIS — I509 Heart failure, unspecified: Secondary | ICD-10-CM | POA: Insufficient documentation

## 2022-12-23 DIAGNOSIS — E559 Vitamin D deficiency, unspecified: Secondary | ICD-10-CM | POA: Diagnosis not present

## 2022-12-23 DIAGNOSIS — Z95828 Presence of other vascular implants and grafts: Secondary | ICD-10-CM | POA: Insufficient documentation

## 2022-12-23 DIAGNOSIS — Z79899 Other long term (current) drug therapy: Secondary | ICD-10-CM | POA: Insufficient documentation

## 2022-12-23 DIAGNOSIS — I2724 Chronic thromboembolic pulmonary hypertension: Secondary | ICD-10-CM | POA: Diagnosis not present

## 2022-12-23 LAB — CBC WITH DIFFERENTIAL/PLATELET
Abs Immature Granulocytes: 0.11 10*3/uL — ABNORMAL HIGH (ref 0.00–0.07)
Basophils Absolute: 0 10*3/uL (ref 0.0–0.1)
Basophils Relative: 0 %
Eosinophils Absolute: 0 10*3/uL (ref 0.0–0.5)
Eosinophils Relative: 0 %
HCT: 36.9 % (ref 36.0–46.0)
Hemoglobin: 11.4 g/dL — ABNORMAL LOW (ref 12.0–15.0)
Immature Granulocytes: 1 %
Lymphocytes Relative: 12 %
Lymphs Abs: 1 10*3/uL (ref 0.7–4.0)
MCH: 21.1 pg — ABNORMAL LOW (ref 26.0–34.0)
MCHC: 30.9 g/dL (ref 30.0–36.0)
MCV: 68.2 fL — ABNORMAL LOW (ref 80.0–100.0)
Monocytes Absolute: 0.2 10*3/uL (ref 0.1–1.0)
Monocytes Relative: 3 %
Neutro Abs: 6.8 10*3/uL (ref 1.7–7.7)
Neutrophils Relative %: 84 %
Platelets: 403 10*3/uL — ABNORMAL HIGH (ref 150–400)
RBC: 5.41 MIL/uL — ABNORMAL HIGH (ref 3.87–5.11)
RDW: 18.7 % — ABNORMAL HIGH (ref 11.5–15.5)
WBC: 8.2 10*3/uL (ref 4.0–10.5)
nRBC: 0 % (ref 0.0–0.2)

## 2022-12-23 LAB — LIPID PANEL
Cholesterol: 126 mg/dL (ref 0–200)
HDL: 42 mg/dL (ref >40)
LDL Cholesterol: 73 mg/dL (ref 0–99)
Total CHOL/HDL Ratio: 3 RATIO
Triglycerides: 57 mg/dL (ref <150)
VLDL: 11 mg/dL (ref 0–40)

## 2022-12-23 LAB — TSH: TSH: 0.114 u[IU]/mL — ABNORMAL LOW (ref 0.350–4.500)

## 2022-12-23 LAB — IRON AND TIBC
Iron: 62 ug/dL (ref 28–170)
Saturation Ratios: 21 % (ref 10.4–31.8)
TIBC: 298 ug/dL (ref 250–450)
UIBC: 236 ug/dL

## 2022-12-23 LAB — COMPREHENSIVE METABOLIC PANEL
ALT: 18 U/L (ref 0–44)
AST: 20 U/L (ref 15–41)
Albumin: 3.3 g/dL — ABNORMAL LOW (ref 3.5–5.0)
Alkaline Phosphatase: 53 U/L (ref 38–126)
Anion gap: 9 (ref 5–15)
BUN: 22 mg/dL — ABNORMAL HIGH (ref 6–20)
CO2: 24 mmol/L (ref 22–32)
Calcium: 9.5 mg/dL (ref 8.9–10.3)
Chloride: 106 mmol/L (ref 98–111)
Creatinine, Ser: 1.06 mg/dL — ABNORMAL HIGH (ref 0.44–1.00)
GFR, Estimated: >60 mL/min (ref >60)
Glucose, Bld: 138 mg/dL — ABNORMAL HIGH (ref 70–99)
Potassium: 3.7 mmol/L (ref 3.5–5.1)
Sodium: 139 mmol/L (ref 135–145)
Total Bilirubin: 0.5 mg/dL (ref 0.3–1.2)
Total Protein: 7.1 g/dL (ref 6.5–8.1)

## 2022-12-23 LAB — URINALYSIS, ROUTINE W REFLEX MICROSCOPIC
Bilirubin Urine: NEGATIVE
Glucose, UA: NEGATIVE mg/dL
Hgb urine dipstick: NEGATIVE
Ketones, ur: NEGATIVE mg/dL
Leukocytes,Ua: NEGATIVE
Nitrite: NEGATIVE
Protein, ur: NEGATIVE mg/dL
Specific Gravity, Urine: 1.01 (ref 1.005–1.030)
pH: 5 (ref 5.0–8.0)

## 2022-12-23 LAB — VITAMIN D 25 HYDROXY (VIT D DEFICIENCY, FRACTURES): Vit D, 25-Hydroxy: 20.6 ng/mL — ABNORMAL LOW (ref 30–100)

## 2022-12-23 LAB — HIV ANTIBODY (ROUTINE TESTING W REFLEX): HIV Screen 4th Generation wRfx: NONREACTIVE

## 2022-12-23 LAB — FERRITIN: Ferritin: 58 ng/mL (ref 11–307)

## 2022-12-23 LAB — BRAIN NATRIURETIC PEPTIDE: B Natriuretic Peptide: 53.9 pg/mL (ref 0.0–100.0)

## 2022-12-23 LAB — VITAMIN B12: Vitamin B-12: 594 pg/mL (ref 180–914)

## 2022-12-23 MED ORDER — HEPARIN SOD (PORK) LOCK FLUSH 100 UNIT/ML IV SOLN
500.0000 [IU] | INTRAVENOUS | Status: AC | PRN
  Administered 2022-12-23: 500 [IU]

## 2022-12-23 MED ORDER — SODIUM CHLORIDE 0.9% FLUSH
10.0000 mL | INTRAVENOUS | Status: AC | PRN
  Administered 2022-12-23: 10 mL

## 2022-12-23 NOTE — Progress Notes (Signed)
PATIENT CARE CENTER NOTE       Provider: Hillery Aldo, NP     Procedure: Port-a-cath flush (dose # 8 of 11) and lab draw     Note: Port-a-cath accessed using sterile technique. Patient tolerated well. Labs drawn from port per Hillery Aldo, NP order. PAC flushed with Heparin and 0.9% NS  and deaccessed. Band-aid placed over site. Patient to come monthly for flushes. Patient alert, oriented and ambulatory at discharge.

## 2022-12-25 ENCOUNTER — Encounter: Payer: Self-pay | Admitting: Student

## 2022-12-26 ENCOUNTER — Other Ambulatory Visit (HOSPITAL_COMMUNITY): Payer: Self-pay

## 2023-01-01 ENCOUNTER — Other Ambulatory Visit: Payer: Medicare PPO

## 2023-01-20 ENCOUNTER — Other Ambulatory Visit (HOSPITAL_COMMUNITY): Payer: Self-pay | Admitting: Internal Medicine

## 2023-01-22 ENCOUNTER — Encounter (HOSPITAL_COMMUNITY): Payer: Medicare PPO

## 2023-02-07 ENCOUNTER — Telehealth (HOSPITAL_COMMUNITY): Payer: Self-pay | Admitting: Pharmacy Technician

## 2023-02-07 NOTE — Telephone Encounter (Signed)
Advanced Heart Failure Patient Advocate Encounter  Patient called in stating that she could not get through to [facility] to order Adempas. Stated the call isn't going through. Patient is now using [facility] since she has a grant to help pay for Adempas. Provided number to [facility] [phone]).   Archer Asa, CPhT

## 2023-03-05 ENCOUNTER — Non-Acute Institutional Stay (HOSPITAL_COMMUNITY)
Admission: RE | Admit: 2023-03-05 | Discharge: 2023-03-05 | Disposition: A | Discharge status: Home / Self Care | Payer: Medicare PPO | Source: Ambulatory Visit | Attending: Internal Medicine | Admitting: Internal Medicine

## 2023-03-05 DIAGNOSIS — Z452 Encounter for adjustment and management of vascular access device: Secondary | ICD-10-CM | POA: Insufficient documentation

## 2023-03-05 MED ORDER — HEPARIN SOD (PORK) LOCK FLUSH 100 UNIT/ML IV SOLN
500.0000 [IU] | INTRAVENOUS | Status: AC | PRN
  Administered 2023-03-05: 500 [IU]
  Filled 2023-03-05: qty 5

## 2023-03-05 MED ORDER — SODIUM CHLORIDE 0.9% FLUSH
10.0000 mL | INTRAVENOUS | Status: AC | PRN
  Administered 2023-03-05: 10 mL

## 2023-03-05 NOTE — Progress Notes (Addendum)
PATIENT CARE CENTER NOTE:  Provider: Hillery Aldo NP     Procedure: Port-a-cath flush ( # 9 of 10)     Note: Patient's PAC accessed using sterile technique, flushed with 10 cc 0.9% sodium chloride and heparin. No labs required today. Blood return noted. PAC de-accessed and site covered with band aid and gauze. Patient tolerated well. Pt declined AVS. Pt instructed to schedule monthly port flush at front desk prior to leaving, pt verbalized understanding.  Alert, oriented and ambulatory with cane at discharge.

## 2023-03-06 ENCOUNTER — Encounter: Payer: Self-pay | Admitting: Internal Medicine

## 2023-03-27 ENCOUNTER — Ambulatory Visit: Payer: Medicare PPO

## 2023-03-27 ENCOUNTER — Other Ambulatory Visit: Payer: Self-pay

## 2023-03-27 ENCOUNTER — Encounter (HOSPITAL_COMMUNITY): Payer: Self-pay

## 2023-03-27 ENCOUNTER — Emergency Department (HOSPITAL_COMMUNITY)
Admission: EM | Admit: 2023-03-27 | Discharge: 2023-03-27 | Disposition: A | Discharge status: Home / Self Care | Payer: Medicare PPO | Attending: Emergency Medicine | Admitting: Emergency Medicine

## 2023-03-27 ENCOUNTER — Emergency Department (HOSPITAL_COMMUNITY): Payer: Medicare PPO

## 2023-03-27 DIAGNOSIS — Z1152 Encounter for screening for COVID-19: Secondary | ICD-10-CM | POA: Insufficient documentation

## 2023-03-27 DIAGNOSIS — K625 Hemorrhage of anus and rectum: Secondary | ICD-10-CM | POA: Diagnosis not present

## 2023-03-27 DIAGNOSIS — R1030 Lower abdominal pain, unspecified: Secondary | ICD-10-CM | POA: Insufficient documentation

## 2023-03-27 DIAGNOSIS — Z7901 Long term (current) use of anticoagulants: Secondary | ICD-10-CM | POA: Insufficient documentation

## 2023-03-27 DIAGNOSIS — K4090 Unilateral inguinal hernia, without obstruction or gangrene, not specified as recurrent: Secondary | ICD-10-CM

## 2023-03-27 DIAGNOSIS — K429 Umbilical hernia without obstruction or gangrene: Secondary | ICD-10-CM

## 2023-03-27 DIAGNOSIS — K642 Third degree hemorrhoids: Secondary | ICD-10-CM

## 2023-03-27 DIAGNOSIS — I509 Heart failure, unspecified: Secondary | ICD-10-CM | POA: Diagnosis not present

## 2023-03-27 DIAGNOSIS — I110 Hypertensive heart disease with heart failure: Secondary | ICD-10-CM | POA: Diagnosis not present

## 2023-03-27 LAB — URINALYSIS, ROUTINE W REFLEX MICROSCOPIC
Bilirubin Urine: NEGATIVE
Glucose, UA: NEGATIVE mg/dL
Hgb urine dipstick: NEGATIVE
Ketones, ur: NEGATIVE mg/dL
Leukocytes,Ua: NEGATIVE
Nitrite: NEGATIVE
Protein, ur: NEGATIVE mg/dL
Specific Gravity, Urine: 1.04 — ABNORMAL HIGH (ref 1.005–1.030)
pH: 7 (ref 5.0–8.0)

## 2023-03-27 LAB — COMPREHENSIVE METABOLIC PANEL
ALT: 12 U/L (ref 0–44)
AST: 13 U/L — ABNORMAL LOW (ref 15–41)
Albumin: 3.6 g/dL (ref 3.5–5.0)
Alkaline Phosphatase: 69 U/L (ref 38–126)
Anion gap: 7 (ref 5–15)
BUN: 9 mg/dL (ref 6–20)
CO2: 24 mmol/L (ref 22–32)
Calcium: 9.5 mg/dL (ref 8.9–10.3)
Chloride: 107 mmol/L (ref 98–111)
Creatinine, Ser: 1.02 mg/dL — ABNORMAL HIGH (ref 0.44–1.00)
GFR, Estimated: >60 mL/min (ref >60)
Glucose, Bld: 94 mg/dL (ref 70–99)
Potassium: 3.7 mmol/L (ref 3.5–5.1)
Sodium: 138 mmol/L (ref 135–145)
Total Bilirubin: 0.7 mg/dL (ref 0.3–1.2)
Total Protein: 7.2 g/dL (ref 6.5–8.1)

## 2023-03-27 LAB — CBC
HCT: 38.5 % (ref 36.0–46.0)
Hemoglobin: 12 g/dL (ref 12.0–15.0)
MCH: 21.9 pg — ABNORMAL LOW (ref 26.0–34.0)
MCHC: 31.2 g/dL (ref 30.0–36.0)
MCV: 70.3 fL — ABNORMAL LOW (ref 80.0–100.0)
Platelets: 369 10*3/uL (ref 150–400)
RBC: 5.48 MIL/uL — ABNORMAL HIGH (ref 3.87–5.11)
RDW: 16.9 % — ABNORMAL HIGH (ref 11.5–15.5)
WBC: 8.1 10*3/uL (ref 4.0–10.5)
nRBC: 0 % (ref 0.0–0.2)

## 2023-03-27 LAB — TYPE AND SCREEN
ABO/RH(D): AB POS
Antibody Screen: NEGATIVE

## 2023-03-27 LAB — SARS CORONAVIRUS 2 BY RT PCR: SARS Coronavirus 2 by RT PCR: NEGATIVE

## 2023-03-27 LAB — POC OCCULT BLOOD, ED: Fecal Occult Bld: POSITIVE — AB

## 2023-03-27 MED ORDER — IOHEXOL 350 MG/ML SOLN
75.0000 mL | Freq: Once | INTRAVENOUS | Status: AC | PRN
  Administered 2023-03-27: 75 mL via INTRAVENOUS

## 2023-03-27 MED ORDER — MORPHINE SULFATE (PF) 4 MG/ML IV SOLN
4.0000 mg | Freq: Once | INTRAVENOUS | Status: DC
  Filled 2023-03-27: qty 1

## 2023-03-27 MED ORDER — MORPHINE SULFATE (PF) 4 MG/ML IV SOLN
4.0000 mg | Freq: Once | INTRAVENOUS | Status: AC
  Administered 2023-03-27: 4 mg via INTRAMUSCULAR
  Filled 2023-03-27: qty 1

## 2023-03-27 MED ORDER — ONDANSETRON HCL 4 MG/2ML IJ SOLN
4.0000 mg | Freq: Once | INTRAMUSCULAR | Status: AC
  Administered 2023-03-27: 4 mg via INTRAVENOUS
  Filled 2023-03-27: qty 2

## 2023-03-27 NOTE — ED Provider Notes (Signed)
[place] [facility] AT [facility] Provider Note   CSN: [id number] Arrival date & time: 03/27/23  1119     History  Chief Complaint  Patient presents with   Abdominal Pain    [name] [name] is a 62 y.o. female history of PE on Xarelto, chronic thromboembolic pulmonary hypertension, hypertension, CHF presented with bright red blood per rectum and lower abdominal pain for the past 2 days.  Patient states that she noticed bright red blood per rectum when she would wipe after having a bowel movement.  Patient also notes that she is having lower abdominal pain and states that she is having crampy-like pain but no vaginal bleeding and has had a hysterectomy in the past.  Patient states she still has her appendix but does not have her gallbladder anymore.  Patient states she is still able to have bowel movements and urinate without issue.  Patient states that she is felt warm at home with temperatures of 101 F however that is resolved Tylenol.  Patient denied chest pain, shortness of breath, change in sensation/motor skills, dizziness, LOC  Home Medications Prior to Admission medications   Medication Sig Start Date End Date Taking? Authorizing Provider  albuterol (VENTOLIN HFA) 108 (90 Base) MCG/ACT inhaler Inhale 2 puffs into the lungs every 6 (six) hours as needed for wheezing or shortness of breath. 11/24/19   [provider]  atorvastatin (LIPITOR) 20 MG tablet Take 1 tablet (20 mg total) by mouth daily at 6 PM. 09/17/18   Marcine Matar, MD  Azelastine HCl 137 MCG/SPRAY SOLN Place 1 spray into both nostrils daily as needed (allergies). 09/20/21   [provider]  docusate sodium (COLACE) 100 MG capsule Take 1 capsule (100 mg total) by mouth every 12 (twelve) hours. 10/24/16   Harris, Abigail, PA-C  furosemide (LASIX) 40 MG tablet TAKE 1 TABLET BY MOUTH ONCE DAILY TAKE  AN  EXTRA  TABLET  ONCE  IN  THE  AFTERNOON  IF  NEEDED  FOR  WEIGHT  GAIN  OF   3LBS  OR  MORE 01/20/23   Bensimhon, Bevelyn Buckles, MD  gabapentin (NEURONTIN) 300 MG capsule Take 300 mg by mouth 3 (three) times daily.    [provider]  HYDROcodone-acetaminophen (NORCO) 10-325 MG tablet Take 1 tablet by mouth 3 (three) times daily as needed for moderate pain. Patient not taking: Reported on 10/29/2022 03/21/21   [provider]  lidocaine (LIDODERM) 5 % Place 1 patch onto the skin daily. Remove & Discard patch within 12 hours or as directed by MD Patient not taking: Reported on 10/29/2022 03/02/22   Pricilla Loveless, MD  methocarbamol (ROBAXIN) 500 MG tablet Take 500 mg by mouth every 8 (eight) hours as needed for muscle pain. Patient not taking: Reported on 10/29/2022 03/27/22   [provider]  mirtazapine (REMERON) 15 MG tablet Take 15 mg by mouth at bedtime. Patient not taking: Reported on 10/29/2022 03/27/22   [provider]  montelukast (SINGULAIR) 10 MG tablet Take 10 mg by mouth every evening. Patient not taking: Reported on 10/29/2022 02/21/20   [provider]  Multiple Vitamin (MULTIVITAMIN ADULT PO) Take 1 tablet by mouth daily in the afternoon. Patient not taking: Reported on 10/29/2022    [provider]  omeprazole (PRILOSEC) 40 MG capsule Take 40 mg by mouth 2 (two) times daily.    [provider]  ondansetron (ZOFRAN) 4 MG tablet Take 4 mg by mouth every 8 (eight)  hours as needed for nausea/vomiting. 03/26/22   [provider]  OXYGEN Inhale 2 L into the lungs See admin instructions. At bedtime and during the day when resting    [provider]  polyethylene glycol (MIRALAX / GLYCOLAX) 17 g packet Take 17 g by mouth daily.    [provider]  potassium chloride (KLOR-CON) 10 MEQ tablet Take 10 mEq by mouth every morning. 02/06/22   [provider]  QUEtiapine (SEROQUEL) 300 MG tablet Take 300 mg by mouth at bedtime. Patient not taking: Reported on 10/29/2022 03/27/22   [provider]  Riociguat (ADEMPAS) 2 MG TABS Take 2 mg by mouth 3 (three) times daily. 10/04/22   Bensimhon, Bevelyn Buckles, MD  rivaroxaban (XARELTO) 20 MG TABS tablet Take 1 tablet (20 mg total) by mouth daily with supper. 10/11/22   Bensimhon, Bevelyn Buckles, MD  spironolactone (ALDACTONE) 25 MG tablet Take 0.5 tablets (12.5 mg total) by mouth daily. 12/02/22   Bensimhon, Bevelyn Buckles, MD  zolpidem (AMBIEN) 5 MG tablet Take 5 mg by mouth at bedtime as needed for sleep. Patient not taking: Reported on 10/29/2022 02/14/22   [provider]      Allergies    [name].    Review of Systems   Review of Systems  Gastrointestinal:  Positive for abdominal pain.    Physical Exam Updated Vital Signs BP (!) 153/61 (BP Location: Right Arm)   Pulse 80   Temp 99 F (37.2 C) (Oral)   Resp 19   Ht 5\' 3"  (1.6 m)   Wt (!) 139.7 kg   SpO2 100%   BMI 54.56 kg/m  Physical Exam Constitutional:      General: She is not in acute distress. Cardiovascular:     Rate and Rhythm: Normal rate and regular rhythm.     Pulses: Normal pulses.     Heart sounds: Normal heart sounds.  Pulmonary:     Effort: Pulmonary effort is normal. No respiratory distress.     Breath sounds: Normal breath sounds.  Abdominal:     General: There is no distension.     Palpations: Abdomen is soft.     Comments: Tenderness and guarding to lower abdomen Positive psoas, McBurney's point, Rovsing sign  Genitourinary:    Comments: Chaperone:Jackson, Joya R, RN Reducible hemorrhoid that was not strangulated or hemorrhaging No rectal abnormalities outside of the hemorrhoid Musculoskeletal:        General: Normal range of motion.  Skin:    General: Skin is warm and dry.     Comments: No overlying skin color changes  Neurological:     General: No focal deficit present.     Mental Status: She is alert.  Psychiatric:        Mood and Affect: Mood normal.    ED Results / Procedures / Treatments   Labs (all labs  ordered are listed, but only abnormal results are displayed) Labs Reviewed  COMPREHENSIVE METABOLIC PANEL - Abnormal; Notable for the following components:      Result Value   Creatinine, Ser 1.02 (*)    AST 13 (*)    All other components within normal limits  CBC - Abnormal; Notable for the following components:   RBC 5.48 (*)    MCV 70.3 (*)    MCH 21.9 (*)    RDW 16.9 (*)    All other components within normal limits  POC OCCULT BLOOD, ED - Abnormal; Notable for the following  components:   Fecal Occult Bld POSITIVE (*)    All other components within normal limits  SARS CORONAVIRUS 2 BY RT PCR  TYPE AND SCREEN    EKG EKG Interpretation Date/Time:  Thursday March 27 2023 11:49:32 EDT Ventricular Rate:  96 PR Interval:  168 QRS Duration:  120 QT Interval:  378 QTC Calculation: 477 R Axis:   63  Text Interpretation: Sinus rhythm with occasional Premature ventricular complexes Low voltage QRS Right bundle branch block Abnormal ECG background noise Otherwise no significant change Confirmed by Melene Plan [phone]) on 03/27/2023 2:38:41 PM  Radiology No results found.  Procedures Procedures    Medications Ordered in ED Medications  morphine (PF) 4 MG/ML injection 4 mg (has no administration in time range)    ED Course/ Medical Decision Making/ A&P                             Medical Decision Making Amount and/or Complexity of Data Reviewed Labs: ordered. Radiology: ordered.  Risk Prescription drug management.   Kearstin Learn Zaring 62 y.o. presented today for abdominal pain, bright blood per rectum. Working DDx that I considered at this time includes, but not limited to, gastroenteritis, colitis, small bowel obstruction, appendicitis, cholecystitis, pancreatitis, nephrolithiasis, AAA, UTI, pyelonephritis, ruptured ectopic pregnancy, PID, ovarian torsion, hemorrhoid, GI bleed.  R/o DDx: gastroenteritis, colitis, small bowel obstruction, appendicitis, cholecystitis,  pancreatitis, nephrolithiasis, AAA, UTI, pyelonephritis, ruptured ectopic pregnancy, PID, ovarian torsion, GI bleed: These are considered less likely due to history of present illness and physical exam findings.  Review of prior external notes: 01/03/2023 office visit  Unique Tests and My Interpretation:  CBC with differential: Unremarkable CMP: Unremarkable Lipase: Unremarkable UA: Unremarkable Type and screen: AB+, antibody negative Fecal occult: Positive COVID: Negative EKG: Rate, rhythm, axis, intervals all examined and without medically relevant abnormality. ST segments without concerns for elevations CT Abd/Pelvis with contrast: Left inguinal hernia along with umbilical hernia both very tiny with no obstruction  Discussion with Independent Historian:  Husband  Discussion of Management of Tests: None  Risk: Medium: prescription drug management  Risk Stratification Score: None   Plan: On exam patient was in no acute distress but was in obvious discomfort.  With a chaperone in the room a rectal exam was performed and showed a hemorrhoid that was easily reducible and did not have any signs of hemorrhaging or strangulation and gangrene.  Patient did have lower abdominal tenderness and guarding on exam and states she still has her appendix.  Patient is not endorsing any fevers and is afebrile at this time however given physical exam patient will need a CT scan.  Patient's labs from triage came back reassuring pending urine.  At this time I suspect patient's bright red blood per rectum is most likely related to her hemorrhoid as opposed to a GI bleed and so patient most likely need GI follow-up if discharged.  Patient CT came back ultimately reassuring however does show to tiny hernias 1 in her left inguinal crease and the other by her umbilicus.  This could possibly be causing patient's pain.  At this time we are still awaiting urine however pending her urine patient may be discharged as  her vitals are stable and can follow-up with GI in regards to her hemorrhoid and general surgery in regards to her hernias.  At this time a very low suspicion of a GI bleed as a hemorrhoid was found on exam that is  easily reducible and patient reported bright red blood per rectum as opposed to dark red or melena that would indicate lower or upper GI bleed.  Urine came back reassuring and so at this time patient will be discharged with general surgery follow-up for hernias and GI follow-up for her hemorrhoids.  Patient was given return precautions. Patient stable for discharge at this time.  Patient verbalized understanding of plan.         Final Clinical Impression(s) / ED Diagnoses Final diagnoses:  None    Rx / DC Orders ED Discharge Orders     None         Remi Deter 03/27/23 2110    Arby Barrette, MD 03/28/23 [phone]

## 2023-03-27 NOTE — ED Triage Notes (Addendum)
Pt c/o lower abdominal pain that radiates to lower back. Pt c/o bright red blood from rectum when she wipedx3d. Pt states when she looked in the commode, there was a stone in the toilet. Pt c/o weakness and dizziness

## 2023-03-27 NOTE — Discharge Instructions (Signed)
Please follow-up with the general surgeon I have attached your for you today for your hernias.  I have also attached for you a GI specialist to follow-up with for your hemorrhoids.  Today your labs are reassuring and your abdominal pain is most likely related to your hernias.  Please take Tylenol every 6 hours needed for pain and symptoms change or worsen please return to ER.

## 2023-03-27 NOTE — ED Notes (Signed)
Waiting for iv team to access port or start peripheral line for CT

## 2023-04-04 ENCOUNTER — Encounter (HOSPITAL_COMMUNITY): Payer: Medicare PPO

## 2023-04-05 ENCOUNTER — Other Ambulatory Visit (HOSPITAL_COMMUNITY): Payer: Self-pay

## 2023-04-08 ENCOUNTER — Ambulatory Visit
Admission: RE | Admit: 2023-04-08 | Discharge: 2023-04-08 | Disposition: A | Discharge status: Home / Self Care | Payer: Medicare PPO | Source: Ambulatory Visit | Attending: Student | Admitting: Student

## 2023-04-08 DIAGNOSIS — Z1231 Encounter for screening mammogram for malignant neoplasm of breast: Secondary | ICD-10-CM

## 2023-04-10 ENCOUNTER — Encounter (HOSPITAL_COMMUNITY): Payer: Medicare PPO

## 2023-04-14 ENCOUNTER — Encounter (HOSPITAL_COMMUNITY): Payer: Medicare PPO

## 2023-04-22 ENCOUNTER — Ambulatory Visit: Payer: Self-pay | Admitting: Surgery

## 2023-04-23 ENCOUNTER — Telehealth: Payer: Self-pay | Admitting: *Deleted

## 2023-04-23 NOTE — Telephone Encounter (Signed)
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    Procedure:  Bilateral Inguinal Hernia's and Periumbilical Hearnia Repair.    Date of procedure: TBD  s/p thromboembolectomy and IVC filter placement at [facility] 7/17 - recent LE u/s with evidence of chronic DVT    CrCl 81 ml/min  Patient previously cleared by Dr. Teressa Lower to hold Xarelto  Per office protocol, patient can hold Xarelto for 2 days prior to procedure.    **This guidance is not considered finalized until pre-operative APP has relayed final recommendations.**

## 2023-04-23 NOTE — Telephone Encounter (Signed)
   Pre-operative Risk Assessment    Patient Name: [name] [name]  DOB: 08-27-1961 MRN: [id number]      Request for Surgical Clearance    Procedure:   Bilateral Inguinal Hernia's and Periumbilical Hearnia Repair.   Date of Surgery:  Clearance TBD                                 Surgeon:  Dr. Karie Soda Surgeon's Group or Practice Name:  [facility] Phone number:  [phone] Fax number:  [phone]   Type of Clearance Requested:   - Medical  - Pharmacy:  Hold Rivaroxaban (Xarelto) Not Indicated.   Type of Anesthesia:  General    Additional requests/questions:    Signed, Emmit Pomfret   04/23/2023, 7:41 AM

## 2023-04-30 NOTE — Telephone Encounter (Signed)
Patient returned Pre-op call. 

## 2023-04-30 NOTE — Telephone Encounter (Signed)
   Name: [name]  DOB: [birth year]  MRN: [id number]  Primary Cardiologist: None   Preoperative team, please contact this patient and set up a phone call appointment for further preoperative risk assessment. Please obtain consent and complete medication review. Thank you for your help.  I confirm that guidance regarding antiplatelet and oral anticoagulation therapy has been completed and, if necessary, noted below.  Per office protocol, patient can hold Xarelto for 2 days prior to procedure.    Napoleon Form, Leodis Rains, NP 04/30/2023, 8:58 AM [facility]

## 2023-04-30 NOTE — Telephone Encounter (Signed)
Attempted to reach the patient on home phone but no answer. Left message on home voicemail requesting a call back to schedule telehealth appt for cardiac clearance.   Mobile number not accepting calls at this time.

## 2023-04-30 NOTE — Telephone Encounter (Addendum)
Left message for the patient to contact the office.   Sent message to the scheduler requesting they let me know when the patient calls back to send me the call.

## 2023-04-30 NOTE — Telephone Encounter (Signed)
Patient is returning call.  °

## 2023-05-01 ENCOUNTER — Telehealth: Payer: Self-pay

## 2023-05-01 NOTE — Telephone Encounter (Signed)
  Patient Consent for Virtual Visit         [name] [name] has provided verbal consent on 05/01/2023 for a virtual visit (video or telephone).   CONSENT FOR VIRTUAL VISIT FOR:  [name] [name]  By participating in this virtual visit I agree to the following:  I hereby voluntarily request, consent and authorize [facility] and its employed or contracted physicians, physician assistants, nurse practitioners or other licensed health care professionals (the Practitioner), to provide me with telemedicine health care services (the "Services") as deemed necessary by the treating Practitioner. I acknowledge and consent to receive the Services by the Practitioner via telemedicine. I understand that the telemedicine visit will involve communicating with the Practitioner through live audiovisual communication technology and the disclosure of certain medical information by electronic transmission. I acknowledge that I have been given the opportunity to request an in-person assessment or other available alternative prior to the telemedicine visit and am voluntarily participating in the telemedicine visit.  I understand that I have the right to withhold or withdraw my consent to the use of telemedicine in the course of my care at any time, without affecting my right to future care or treatment, and that the Practitioner or I may terminate the telemedicine visit at any time. I understand that I have the right to inspect all information obtained and/or recorded in the course of the telemedicine visit and may receive copies of available information for a reasonable fee.  I understand that some of the potential risks of receiving the Services via telemedicine include:  Delay or interruption in medical evaluation due to technological equipment failure or disruption; Information transmitted may not be sufficient (e.g. poor resolution of images) to allow for appropriate medical decision making by the  Practitioner; and/or  In rare instances, security protocols could fail, causing a breach of personal health information.  Furthermore, I acknowledge that it is my responsibility to provide information about my medical history, conditions and care that is complete and accurate to the best of my ability. I acknowledge that Practitioner's advice, recommendations, and/or decision may be based on factors not within their control, such as incomplete or inaccurate data provided by me or distortions of diagnostic images or specimens that may result from electronic transmissions. I understand that the practice of medicine is not an exact science and that Practitioner makes no warranties or guarantees regarding treatment outcomes. I acknowledge that a copy of this consent can be made available to me via my patient portal [facility] MyChart), or I can request a printed copy by calling the office of [facility].    I understand that my insurance will be billed for this visit.   I have read or had this consent read to me. I understand the contents of this consent, which adequately explains the benefits and risks of the Services being provided via telemedicine.  I have been provided ample opportunity to ask questions regarding this consent and the Services and have had my questions answered to my satisfaction. I give my informed consent for the services to be provided through the use of telemedicine in my medical care

## 2023-05-01 NOTE — Telephone Encounter (Signed)
Spoke with the patient and she is agreeable with virtual appointment. She states she is in a lot of pain and would like to have her surgery schedule soon. Appt scheduled for 05/09/23. Med list reviewed and consent given.

## 2023-05-05 ENCOUNTER — Non-Acute Institutional Stay (HOSPITAL_COMMUNITY)
Admission: RE | Admit: 2023-05-05 | Discharge: 2023-05-05 | Disposition: A | Discharge status: Home / Self Care | Payer: Medicare PPO | Source: Ambulatory Visit | Attending: Internal Medicine | Admitting: Internal Medicine

## 2023-05-05 DIAGNOSIS — Z79899 Other long term (current) drug therapy: Secondary | ICD-10-CM | POA: Diagnosis not present

## 2023-05-05 DIAGNOSIS — Z01818 Encounter for other preprocedural examination: Secondary | ICD-10-CM

## 2023-05-05 DIAGNOSIS — Z1159 Encounter for screening for other viral diseases: Secondary | ICD-10-CM | POA: Diagnosis present

## 2023-05-05 LAB — CBC WITH DIFFERENTIAL/PLATELET
Abs Immature Granulocytes: 0.02 10*3/uL (ref 0.00–0.07)
Basophils Absolute: 0 10*3/uL (ref 0.0–0.1)
Basophils Relative: 0 %
Eosinophils Absolute: 0.2 10*3/uL (ref 0.0–0.5)
Eosinophils Relative: 3 %
HCT: 35.5 % — ABNORMAL LOW (ref 36.0–46.0)
Hemoglobin: 11.1 g/dL — ABNORMAL LOW (ref 12.0–15.0)
Immature Granulocytes: 0 %
Lymphocytes Relative: 33 %
Lymphs Abs: 2 10*3/uL (ref 0.7–4.0)
MCH: 21.9 pg — ABNORMAL LOW (ref 26.0–34.0)
MCHC: 31.3 g/dL (ref 30.0–36.0)
MCV: 70 fL — ABNORMAL LOW (ref 80.0–100.0)
Monocytes Absolute: 0.5 10*3/uL (ref 0.1–1.0)
Monocytes Relative: 8 %
Neutro Abs: 3.3 10*3/uL (ref 1.7–7.7)
Neutrophils Relative %: 56 %
Platelets: 343 10*3/uL (ref 150–400)
RBC: 5.07 MIL/uL (ref 3.87–5.11)
RDW: 16.8 % — ABNORMAL HIGH (ref 11.5–15.5)
WBC: 5.9 10*3/uL (ref 4.0–10.5)
nRBC: 0 % (ref 0.0–0.2)

## 2023-05-05 LAB — COMPREHENSIVE METABOLIC PANEL
ALT: 10 U/L (ref 0–44)
AST: 13 U/L — ABNORMAL LOW (ref 15–41)
Albumin: 3.4 g/dL — ABNORMAL LOW (ref 3.5–5.0)
Alkaline Phosphatase: 62 U/L (ref 38–126)
Anion gap: 11 (ref 5–15)
BUN: 12 mg/dL (ref 6–20)
CO2: 27 mmol/L (ref 22–32)
Calcium: 9.5 mg/dL (ref 8.9–10.3)
Chloride: 104 mmol/L (ref 98–111)
Creatinine, Ser: 1.05 mg/dL — ABNORMAL HIGH (ref 0.44–1.00)
GFR, Estimated: >60 mL/min (ref >60)
Glucose, Bld: 91 mg/dL (ref 70–99)
Potassium: 3.7 mmol/L (ref 3.5–5.1)
Sodium: 142 mmol/L (ref 135–145)
Total Bilirubin: 0.3 mg/dL (ref 0.3–1.2)
Total Protein: 6.8 g/dL (ref 6.5–8.1)

## 2023-05-05 LAB — HEPATITIS B SURFACE ANTIGEN: Hepatitis B Surface Ag: NONREACTIVE

## 2023-05-05 LAB — HEMOGLOBIN A1C
Hgb A1c MFr Bld: 5.9 % — ABNORMAL HIGH (ref 4.8–5.6)
Mean Plasma Glucose: 122.63 mg/dL

## 2023-05-05 LAB — HEPATITIS C ANTIBODY: HCV Ab: NONREACTIVE

## 2023-05-05 LAB — TSH: TSH: 0.201 u[IU]/mL — ABNORMAL LOW (ref 0.350–4.500)

## 2023-05-05 MED ORDER — SODIUM CHLORIDE 0.9% FLUSH
10.0000 mL | INTRAVENOUS | Status: AC | PRN
  Administered 2023-05-05: 10 mL

## 2023-05-05 MED ORDER — HEPARIN SOD (PORK) LOCK FLUSH 100 UNIT/ML IV SOLN
500.0000 [IU] | INTRAVENOUS | Status: AC | PRN
  Administered 2023-05-05: 500 [IU]
  Filled 2023-05-05: qty 5

## 2023-05-05 NOTE — Progress Notes (Signed)
PATIENT CARE CENTER NOTE       Provider: Hillery Aldo, NP     Procedure: Port-a-cath flush (dose # 10 of 11) and lab draw     Note: Port-a-cath accessed using sterile technique. Patient tolerated well. Labs drawn from port per Hillery Aldo, NP order. PAC flushed with Heparin and 0.9% NS  and deaccessed. Band-aid placed over site. Will fax labs when they result. Patient to come monthly for flushes. Patient alert, oriented and ambulatory at discharge.

## 2023-05-09 ENCOUNTER — Ambulatory Visit: Payer: Medicare PPO | Attending: Cardiology | Admitting: Nurse Practitioner

## 2023-05-09 DIAGNOSIS — Z01810 Encounter for preprocedural cardiovascular examination: Secondary | ICD-10-CM | POA: Diagnosis not present

## 2023-05-09 NOTE — Progress Notes (Signed)
Virtual Visit via Telephone Note   Because of Pota Hanis Haberer's co-morbid illnesses, she is at least at moderate risk for complications without adequate follow up.  This format is felt to be most appropriate for this patient at this time.  The patient did not have access to video technology/had technical difficulties with video requiring transitioning to audio format only (telephone).  All issues noted in this document were discussed and addressed.  No physical exam could be performed with this format.  Please refer to the patient's chart for her consent to telehealth for [facility].  Evaluation Performed:  Preoperative cardiovascular risk assessment _____________   Date:  05/09/2023   Patient ID:  [name] [name], DOB 03/19/1961, MRN [id number] Patient Location:  Home Provider location:   Office  Primary Care Provider:  Yves Dill, MD Primary Cardiologist:  None  Chief Complaint / Patient Profile   62 y.o. y/o female with a h/o pulmonary hypertension, chronic chest pain, palpitations, fibromyalgia, rheumatoid arthritis, chronic pain syndrome, OSA, and obesity who is pending Bilateral Inguinal Hernia's and Periumbilical Hearnia Repair with Dr[facility] and presents today for telephonic preoperative cardiovascular risk assessment.  History of Present Illness    [name] [name] is a 62 y.o. female who presents via audio/video conferencing for a telehealth visit today.  Pt was last seen in cardiology clinic on 10/29/2022 by Prince Rome, NP.  At that time [name] [name] was stable from a cardiac standpoint.  The patient is now pending procedure as outlined above. Since her last visit, she has been stable from a cardiac standpoint.  She has stable chronic dyspnea in the setting of pulmonary hypertension.  She denies chest pain, palpitations, worsening dyspnea, pnd, orthopnea, n, v, dizziness, syncope, edema, weight gain, or  early satiety. All other systems reviewed and are otherwise negative except as noted above.   Past Medical History    Past Medical History:  Diagnosis Date   Anxiety    Arthritis    CHF exacerbation (HCC) 08/21/2017   Depression    Depression    Difficult intubation    See [facility] anesthesia records in Care Everywhere   Diverticulitis    Dyspnea    with exertion   Family history of adverse reaction to anesthesia    " My mother stopped breathing during procedure"   GERD (gastroesophageal reflux disease)    PMH; resolved   Hyperlipidemia    Hypertension    Obesity    Plantar fasciitis, left    Pneumonia    Port-A-Cath in place    Pre-diabetes    Pulmonary embolism (HCC)    Pulmonary hypertension (HCC)    Sleep apnea    wears CPAP # 9   Wears glasses    Past Surgical History:  Procedure Laterality Date   ABDOMINAL HYSTERECTOMY     BIOPSY  02/26/2020   Procedure: BIOPSY;  Surgeon: Kerin Salen, MD;  Location[facility] ENDOSCOPY;  Service: Gastroenterology;;   BREAST REDUCTION SURGERY     CHOLECYSTECTOMY     COLONOSCOPY W/ BIOPSIES AND POLYPECTOMY     EMBOLECTOMY N/A    pulmonary embolectomy   ESOPHAGOGASTRODUODENOSCOPY N/A 02/26/2020   Procedure: ESOPHAGOGASTRODUODENOSCOPY (EGD);  Surgeon: Kerin Salen, MD;  Location: [facility] ENDOSCOPY;  Service: Gastroenterology;  Laterality: N/A;   FRACTURE SURGERY     right ankle   GASTROCNEMIUS RECESSION Left 02/12/2019   Procedure: LEFT GASTROCNEMIUS RECESSION, PLANTAR FASCIA RELEASE;  Surgeon: Nadara Mustard, MD;  Location:  [facility] OR;  Service: Orthopedics;  Laterality: Left;   IVC FILTER INSERTION     REDUCTION MAMMAPLASTY Bilateral    RIGHT HEART CATH N/A 10/28/2016   Procedure: Right Heart Cath;  Surgeon: Dolores Patty, MD;  Location: [facility] INVASIVE CV LAB;  Service: Cardiovascular;  Laterality: N/A;   RIGHT HEART CATH N/A 08/26/2017   Procedure: RIGHT HEART CATH;  Surgeon: Dolores Patty, MD;  Location: [facility] INVASIVE CV LAB;  Service:  Cardiovascular;  Laterality: N/A;   RIGHT HEART CATH N/A 07/05/2019   Procedure: RIGHT HEART CATH;  Surgeon: Dolores Patty, MD;  Location: [facility] INVASIVE CV LAB;  Service: Cardiovascular;  Laterality: N/A;   RIGHT HEART CATH N/A 07/24/2020   Procedure: RIGHT HEART CATH;  Surgeon: Dolores Patty, MD;  Location: [facility] INVASIVE CV LAB;  Service: Cardiovascular;  Laterality: N/A;   RIGHT HEART CATH N/A 04/16/2022   Procedure: RIGHT HEART CATH;  Surgeon: Dolores Patty, MD;  Location: [facility] INVASIVE CV LAB;  Service: Cardiovascular;  Laterality: N/A;   ULTRASOUND GUIDANCE FOR VASCULAR ACCESS  08/26/2017   Procedure: Ultrasound Guidance For Vascular Access;  Surgeon: Dolores Patty, MD;  Location: [facility] INVASIVE CV LAB;  Service: Cardiovascular;;   WISDOM TOOTH EXTRACTION      Allergies  No Known Allergies  Home Medications    Prior to Admission medications   Medication Sig Start Date End Date Taking? Authorizing Provider  albuterol (VENTOLIN HFA) 108 (90 Base) MCG/ACT inhaler Inhale 2 puffs into the lungs every 6 (six) hours as needed for wheezing or shortness of breath. 11/24/19   [provider]  atorvastatin (LIPITOR) 20 MG tablet Take 1 tablet (20 mg total) by mouth daily at 6 PM. 09/17/18   Marcine Matar, MD  Azelastine HCl 137 MCG/SPRAY SOLN Place 1 spray into both nostrils daily as needed (allergies). 09/20/21   [provider]  cyclobenzaprine (FLEXERIL) 10 MG tablet Take 10 mg by mouth 3 (three) times daily as needed for muscle spasms.    [provider]  docusate sodium (COLACE) 100 MG capsule Take 1 capsule (100 mg total) by mouth every 12 (twelve) hours. 10/24/16   Harris, Abigail, PA-C  furosemide (LASIX) 40 MG tablet TAKE 1 TABLET BY MOUTH ONCE DAILY TAKE  AN  EXTRA  TABLET  ONCE  IN  THE  AFTERNOON  IF  NEEDED  FOR  WEIGHT  GAIN  OF  3LBS  OR  MORE 01/20/23   Bensimhon, Bevelyn Buckles, MD  HYDROcodone-acetaminophen (NORCO) 10-325 MG tablet Take 1 tablet  by mouth 3 (three) times daily as needed for moderate pain. 03/21/21   [provider]  montelukast (SINGULAIR) 10 MG tablet Take 10 mg by mouth every evening. 02/21/20   [provider]  omeprazole (PRILOSEC) 40 MG capsule Take 40 mg by mouth 2 (two) times daily.    [provider]  ondansetron (ZOFRAN) 4 MG tablet Take 4 mg by mouth every 8 (eight) hours as needed for nausea/vomiting. 03/26/22   [provider]  OXYGEN Inhale 2 L into the lungs See admin instructions. At bedtime and during the day when resting    [provider]  polyethylene glycol (MIRALAX / GLYCOLAX) 17 g packet Take 17 g by mouth daily.    [provider]  potassium chloride (KLOR-CON) 10 MEQ tablet Take 10 mEq by mouth every morning. 02/06/22   [provider]  QUEtiapine (SEROQUEL) 300 MG tablet Take 300 mg by mouth at bedtime.  03/27/22   [provider]  Riociguat (ADEMPAS) 2 MG TABS Take 2 mg by mouth 3 (three) times daily. 10/04/22   Bensimhon, Bevelyn Buckles, MD  rivaroxaban (XARELTO) 20 MG TABS tablet Take 1 tablet (20 mg total) by mouth daily with supper. 10/11/22   Bensimhon, Bevelyn Buckles, MD  spironolactone (ALDACTONE) 25 MG tablet Take 0.5 tablets (12.5 mg total) by mouth daily. 12/02/22   Bensimhon, Bevelyn Buckles, MD    Physical Exam    Vital Signs:  [name] [name] does not have vital signs available for review today.  Given telephonic nature of communication, physical exam is limited. AAOx3. NAD. Normal affect.  Speech and respirations are unlabored.  Accessory Clinical Findings    None  Assessment & Plan    1.  Preoperative Cardiovascular Risk Assessment:  According to the Revised Cardiac Risk Index (RCRI), her Perioperative Risk of Major Cardiac Event is (%): 0.9. Her Functional Capacity in METs is: 5.29 according to the [facility] Activity Status Index (DASI). Therefore, based on ACC/AHA guidelines, patient would be at acceptable risk for the planned  procedure without further cardiovascular testing.  The patient was advised that if she develops new symptoms prior to surgery to contact our office to arrange for a follow-up visit, and she verbalized understanding.  Per office protocol, patient can hold Xarelto for 2 days prior to procedure.  Please resume Xarelto as soon as possible postprocedure, at the discretion of the surgeon.    A copy of this note will be routed to requesting surgeon.  Time:   Today, I have spent 5 minutes with the patient with telehealth technology discussing medical history, symptoms, and management plan.     Joylene Grapes, NP  05/09/2023, 2:42 PM

## 2023-06-04 ENCOUNTER — Ambulatory Visit
Admission: RE | Admit: 2023-06-04 | Discharge: 2023-06-04 | Disposition: A | Discharge status: Home / Self Care | Payer: Medicare PPO | Source: Ambulatory Visit | Attending: Student | Admitting: Student

## 2023-06-04 ENCOUNTER — Other Ambulatory Visit: Payer: Self-pay | Admitting: Student

## 2023-06-04 DIAGNOSIS — R053 Chronic cough: Secondary | ICD-10-CM

## 2023-06-24 NOTE — Patient Instructions (Addendum)
SURGICAL WAITING ROOM VISITATION Patients having surgery or a procedure may have no more than 2 support people in the waiting area - these visitors may rotate in the visitor waiting room.   Due to an increase in RSV and influenza rates and associated hospitalizations, children ages 65 and under may not visit patients in [facility] hospitals. If the patient needs to stay at the hospital during part of their recovery, the visitor guidelines for inpatient rooms apply.  PRE-OP VISITATION  Pre-op nurse will coordinate an appropriate time for 1 support person to accompany the patient in pre-op.  This support person may not rotate.  This visitor will be contacted when the time is appropriate for the visitor to come back in the pre-op area.  Please refer to the [facility] website for the visitor guidelines for Inpatients (after your surgery is over and you are in a regular room).  You are not required to quarantine at this time prior to your surgery. However, you must do this: Hand Hygiene often Do NOT share personal items Notify your provider if you are in close contact with someone who has COVID or you develop fever 100.4 or greater, new onset of sneezing, cough, sore throat, shortness of breath or body aches.  If you test positive for Covid or have been in contact with anyone that has tested positive in the last 10 days please notify you surgeon.    Your procedure is scheduled on:  Wednesday  July 09, 2023  Report to [facility] Main Entrance: [facility] [facility]sen entrance where the [employer] is available.   Report to admitting at:  06:15   AM  Call this number if you have any questions or problems the morning of surgery [phone]  Do not eat food after Midnight the night prior to your surgery/procedure.  After Midnight you may have the following liquids until 05:30 AM DAY OF SURGERY  Clear Liquid Diet Water Black Coffee (sugar ok, NO MILK/CREAM OR CREAMERS)  Tea (sugar ok, NO  MILK/CREAM OR CREAMERS) regular and decaf                             Plain Jell-O  with no fruit (NO RED)                                           Fruit ices (not with fruit pulp, NO RED)                                     Popsicles (NO RED)                                                                  Juice: NO CITRUS JUICES: only apple, WHITE grape, WHITE cranberry Sports drinks like Gatorade or Powerade (NO RED)    DRINK two (2) bottles of Pre-Surgery G2 drink starting at 6:00 pm the evening prior to your surgery to help prevent dehydration. Increase drinking clear fluids (see list above)  The day of surgery:  Drink ONE (1) Pre-Surgery G2 at  05:30 AM the morning of surgery. Drink in one sitting. Do not sip.  This drink was given to you during your hospital pre-op appointment visit. Nothing else to drink after completing the Pre-Surgery G2 : No candy, chewing gum or throat lozenges.    FOLLOW ANY ADDITIONAL PRE OP INSTRUCTIONS YOU RECEIVED FROM YOUR SURGEON'S OFFICE!!! .   Oral Hygiene is also important to reduce your risk of infection.        Remember - BRUSH YOUR TEETH THE MORNING OF SURGERY WITH YOUR REGULAR TOOTHPASTE  Do NOT smoke after Midnight the night before surgery.   XARELTO- Stop taking your Xarelto 2 full day before your surgery. You will take the last dose on: Sunday July 06, 2023  STOP TAKING all Vitamins, Herbs and supplements 1 week before your surgery.   Take ONLY these medicines the morning of surgery with A SIP OF WATER: omeprazole, Riociguat (Adempas), montelukast.  You may take Hydrocodone is needed for pain, You may use your Albuterol Inhaler if needed.  If You have been diagnosed with Sleep Apnea - Bring CPAP mask and tubing day of surgery. We will provide you with a CPAP machine on the day of your surgery.                   You may not have any metal on your body including hair pins, jewelry, and body piercing  Do not  wear make-up, lotions, powders, perfumes or deodorant  Do not wear nail polish including gel and S&S, artificial / acrylic nails, or any other type of covering on natural nails including finger and toenails. If you have artificial nails, gel coating, etc., that needs to be removed by a nail salon, Please have this removed prior to surgery. Not doing so may mean that your surgery could be cancelled or delayed if the Surgeon or anesthesia staff feels like they are unable to monitor you safely.   Do not shave 48 hours prior to surgery to avoid nicks in your skin which may contribute to postoperative infections.    Contacts, Hearing Aids, dentures or bridgework may not be worn into surgery. DENTURES WILL BE REMOVED PRIOR TO SURGERY PLEASE DO NOT APPLY "Poly grip" OR ADHESIVES!!!  You may bring a small overnight bag with you on the day of surgery, only pack items that are not valuable. [facility] IS NOT RESPONSIBLE   FOR VALUABLES THAT ARE LOST OR STOLEN.   Do not bring your home medications to the hospital. The Pharmacy will dispense medications listed on your medication list to you during your admission in the Hospital.  Please read over the following fact sheets you were given: IF YOU HAVE QUESTIONS ABOUT YOUR PRE-OP INSTRUCTIONS, PLEASE CALL [phone]   [facility] - Preparing for Surgery Before surgery, you can play an important role.  Because skin is not sterile, your skin needs to be as free of germs as possible.  You can reduce the number of germs on your skin by washing with CHG (chlorahexidine gluconate) soap before surgery.  CHG is an antiseptic cleaner which kills germs and bonds with the skin to continue killing germs even after washing. Please DO NOT use if you have an allergy to CHG or antibacterial soaps.  If your skin becomes reddened/irritated stop using the CHG and inform your nurse when you arrive at Short Stay. Do not shave (including legs and underarms) for at least  48 hours  prior to the first CHG shower.  You may shave your face/neck.  Please follow these instructions carefully:  1.  Shower with CHG Soap the night before surgery and the  morning of surgery.  2.  If you choose to wash your hair, wash your hair first as usual with your normal  shampoo.  3.  After you shampoo, rinse your hair and body thoroughly to remove the shampoo.                             4.  Use CHG as you would any other liquid soap.  You can apply chg directly to the skin and wash.  Gently with a scrungie or clean washcloth.  5.  Apply the CHG Soap to your body ONLY FROM THE NECK DOWN.   Do not use on face/ open                           Wound or open sores. Avoid contact with eyes, ears mouth and genitals (private parts).                       Wash face,  Genitals (private parts) with your normal soap.             6.  Wash thoroughly, paying special attention to the area where your  surgery  will be performed.  7.  Thoroughly rinse your body with warm water from the neck down.  8.  DO NOT shower/wash with your normal soap after using and rinsing off the CHG Soap.            9.  Pat yourself dry with a clean towel.            10.  Wear clean pajamas.            11.  Place clean sheets on your bed the night of your first shower and do not  sleep with pets.  ON THE DAY OF SURGERY : Do not apply any lotions/deodorants the morning of surgery.  Please wear clean clothes to the hospital/surgery center.    FAILURE TO FOLLOW THESE INSTRUCTIONS MAY RESULT IN THE CANCELLATION OF YOUR SURGERY  PATIENT SIGNATURE_________________________________  NURSE SIGNATURE__________________________________  ________________________________________________________________________

## 2023-06-24 NOTE — Progress Notes (Signed)
COVID Vaccine received:  []  No [x]  Yes Date of any COVID positive Test in last 90 days:  PCP - Terri Piedra, MD  ?Yves Dill, MD  Cardiologist - Arvilla Meres, MD , Bernadene Person, NP  cardiac clearance in 05-09-23 Epic note Pulmonology- Chilton Greathouse, MD  Chest x-ray -06-04-2023  2v  Epic  EKG - 03-27-2023 Epic Stress Test -  ECHO - 01-28-2023  Epic Cardiac Cath - 04-16-2022 [facility] by Dr. Gala Romney  PCR screen: []  Ordered & Completed []   No Order but Needs PROFEND     [x]   N/A for this surgery  Surgery Plan:  []  Ambulatory   [x]  Outpatient in bed  []  Admit Anesthesia:    [x]  General  []  Spinal  []   Choice []   MAC  Bowel Prep - []  No  []   Yes ______  Pacemaker / ICD device [x]  No []  Yes   Spinal Cord Stimulator:[x]  No []  Yes       History of Sleep Apnea? []  No [x]  Yes   CPAP used?- [x]  No []  Yes  uses 2L O2 at night  Does the patient monitor blood sugar?   []  N/A   []  No []  Yes  Patient has: []  NO Hx DM   [x]  Pre-DM   []  DM1  []   DM2 Last A1c was: 5.9 on  05-05-2023      Does patient have a [employer] or Dexacom? []  No []  Yes   Fasting Blood Sugar Ranges-  Checks Blood Sugar _____ times a day  Blood Thinner / Instructions:Xarelto  Hold x 2 days, OK'd in E. Monge NP's note 05-09-2023 Aspirin Instructions:  none  ERAS Protocol Ordered: []  No  [x]  Yes PRE-SURGERY []  ENSURE  [x]  G2 X3    Patient is to be NPO after: 0530  Dental hx: []  Dentures:  []  N/A      []  Bridge or Partial:                   []  Loose or Damaged teeth:   Comments:   Activity level: Patient is able / unable to climb a flight of stairs without difficulty; []  No CP  []  No SOB, but would have ___   Patient can / can not perform ADLs without assistance.   Anesthesia review: Pulmonary HTN, RA, CHF, s/p IVC filter for pulm. Saddle embolus, Rt side Port-a-cath, anemia, abn Liver function tests, OSA- No CPAP uses 2L O2, CPS,   Patient denies shortness of breath, fever, cough and chest pain at PAT  appointment.  Patient verbalized understanding and agreement to the Pre-Surgical Instructions that were given to them at this PAT appointment. Patient was also educated of the need to review these PAT instructions again prior to her surgery.I reviewed the appropriate phone numbers to call if they have any and questions or concerns.

## 2023-06-25 ENCOUNTER — Encounter (HOSPITAL_COMMUNITY): Payer: Self-pay

## 2023-06-25 ENCOUNTER — Other Ambulatory Visit: Payer: Self-pay

## 2023-06-25 ENCOUNTER — Telehealth (HOSPITAL_COMMUNITY): Payer: Self-pay | Admitting: Cardiology

## 2023-06-25 ENCOUNTER — Encounter (HOSPITAL_COMMUNITY)
Admission: RE | Admit: 2023-06-25 | Discharge: 2023-06-25 | Discharge status: Home / Self Care | Payer: Medicare PPO | Source: Ambulatory Visit | Attending: Surgery

## 2023-06-25 VITALS — BP 128/62 | HR 67 | Temp 98.0°F | Resp 22 | Ht 63.0 in | Wt 301.0 lb

## 2023-06-25 DIAGNOSIS — Z79891 Long term (current) use of opiate analgesic: Secondary | ICD-10-CM | POA: Diagnosis not present

## 2023-06-25 DIAGNOSIS — Z01812 Encounter for preprocedural laboratory examination: Secondary | ICD-10-CM | POA: Insufficient documentation

## 2023-06-25 DIAGNOSIS — R7303 Prediabetes: Secondary | ICD-10-CM | POA: Insufficient documentation

## 2023-06-25 DIAGNOSIS — Z01818 Encounter for other preprocedural examination: Secondary | ICD-10-CM | POA: Diagnosis present

## 2023-06-25 HISTORY — DX: Anemia, unspecified: D64.9

## 2023-06-25 LAB — COMPREHENSIVE METABOLIC PANEL
ALT: 11 U/L (ref 0–44)
AST: 13 U/L — ABNORMAL LOW (ref 15–41)
Albumin: 3.9 g/dL (ref 3.5–5.0)
Alkaline Phosphatase: 81 U/L (ref 38–126)
Anion gap: 11 (ref 5–15)
BUN: 11 mg/dL (ref 6–20)
CO2: 26 mmol/L (ref 22–32)
Calcium: 9.7 mg/dL (ref 8.9–10.3)
Chloride: 103 mmol/L (ref 98–111)
Creatinine, Ser: 1.07 mg/dL — ABNORMAL HIGH (ref 0.44–1.00)
GFR, Estimated: 59 mL/min — ABNORMAL LOW (ref >60)
Glucose, Bld: 88 mg/dL (ref 70–99)
Potassium: 3.7 mmol/L (ref 3.5–5.1)
Sodium: 140 mmol/L (ref 135–145)
Total Bilirubin: 0.7 mg/dL (ref 0.3–1.2)
Total Protein: 7.9 g/dL (ref 6.5–8.1)

## 2023-06-25 LAB — CBC
HCT: 40.3 % (ref 36.0–46.0)
Hemoglobin: 12.7 g/dL (ref 12.0–15.0)
MCH: 21.9 pg — ABNORMAL LOW (ref 26.0–34.0)
MCHC: 31.5 g/dL (ref 30.0–36.0)
MCV: 69.4 fL — ABNORMAL LOW (ref 80.0–100.0)
Platelets: 362 10*3/uL (ref 150–400)
RBC: 5.81 MIL/uL — ABNORMAL HIGH (ref 3.87–5.11)
RDW: 18.1 % — ABNORMAL HIGH (ref 11.5–15.5)
WBC: 6.9 10*3/uL (ref 4.0–10.5)
nRBC: 0 % (ref 0.0–0.2)

## 2023-06-25 NOTE — Telephone Encounter (Signed)
Pt left message with triage line to report increase in fluid Seen by PCP was told CXR had a lot of fluid in R lung  Pt reports she has not felt the best in the past few weeks   Returned call for additional details No answer-LMOM

## 2023-06-26 ENCOUNTER — Encounter (HOSPITAL_COMMUNITY): Payer: Self-pay

## 2023-06-26 NOTE — Progress Notes (Addendum)
DISCUSSION: [name] [name] is a 62 yo female who presents to PAT prior to LAPAROSCOPIC LEFT AND POSSIBLE RIGHT INGUINAL HERNIA and PERIUMBILICAL INCISIONAL HERNIA on 07/09/23 with Dr. Michaell Cowing. PMH of pulmonary HTN, CHF, HTN, chronic DOE, OSA (has not been using CPAP), difficult vascular access (has port-a-cath), GERD, obesity (BMI 53), pre-diabetes, anxiety, depression.  Prior anesthesia complication includes difficult intubation. Mallampati 4. Patient states she has a card. Advised to bring it with her DOS. See anesthesia note from 03/29/2016: "Placed in OR; Blade size: 4; Airway size (mm): 8.0; Cuffed; Insertion attempts: 1; Secured at 24 cm; Measured from Lips; Complex? Yes; Mask airway: Severe obstruction" First intubating attempt by CTA fellow with video laryngoscope D blade which shows very poor view with excessive soft tissues with poor anatomic landmark. Unable to identify epiglottis, patient was mask by two person with oral airway then re-attempted with combined D blade and brpnchoscope and was unsuccessful due to vocal cord closure. 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 She reported some abdominal swelling and takes extra Lasix 1-2x a week. Has not been using CPAP and uses 2L of O2 at night. Repeat echo was done on 01/28/23 which showed normal EF, no valvular abnormalities. She has chronic DOE which is unchanged. Cardiac clearance received 05/09/23:  " Preoperative Cardiovascular Risk Assessment: According to the Revised Cardiac Risk Index (RCRI), her Perioperative Risk of Major Cardiac Event is (%): 0.9. Her Functional Capacity in METs is: 5.29 according to the [facility] Activity Status Index (DASI). Therefore, based on ACC/AHA guidelines, patient would be at  acceptable risk for the planned procedure without further cardiovascular testing. Per office protocol, patient can hold Xarelto for 2 days prior to procedure.  Please resume Xarelto as soon as possible postprocedure, at the discretion of the surgeon."  She followed up in CHF clinic on 07/03/23 and reported increased DOE to them. She was switched from Lasix to Torsemide. Per provider at that visit  "Preoperative cardiac risk assessment   She is planning to undergo laparoscopic bilateral inguinal hernia and periumbilical hernia repair with Dr. Michaell Cowing on 10/30. Her risk of perioperative complications is moderately increased in view of her cardiac history. Would avoid general anesthesia if at all possible to minimize risk. She has been taking Ozempic. Instructed her to contact her surgical team to see if this needs to be held perioperatively."  Per Patient last day of Ozempic was 10/22. She understands to not take 10/29 dose. She reports her breathing and swelling are better since starting Torsemide. She is seeing her cardiologist again prior to surgery   VS: BP 128/62 Comment: right forearm sitting  Pulse 67   Temp 36.7 C (Oral)   Resp (!) 22   Ht 5\' 3"  (1.6 m)   Wt (!) 136.5 kg   SpO2 100%   BMI 53.32 kg/m   PROVIDERS: Hillery Aldo, NP Cardiology: Arvilla Meres, MD  LABS: Labs reviewed: Acceptable for surgery. (all labs ordered are listed, but only abnormal results are displayed)  Labs Reviewed  COMPREHENSIVE METABOLIC PANEL - Abnormal; Notable for the following components:      Result Value   Creatinine, Ser 1.07 (*)    AST 13 (*)    GFR, Estimated 59 (*)    All other components within normal  limits  CBC - Abnormal; Notable for the following components:   RBC 5.81 (*)    MCV 69.4 (*)    MCH 21.9 (*)    RDW 18.1 (*)    All other components within normal limits     IMAGES:  CXR 06/04/23:  FINDINGS: Cardiac silhouette is prominent. There is central pulmonary  arterial prominence which may indicate pulmonary arterial hypertension. This is a stable finding. No pneumothorax. There is a small right-sided pleural effusion. Normal pulmonary vasculature. Median sternotomy wires. Calcified aorta. Right-sided Port-A-Cath tip distal SVC.   IMPRESSION: Enlarged cardiac silhouette. Central pulmonary arterial prominence suggesting pulmonary arterial hypertension. Small pleural effusion on the right.     EKG:   CV:  Echo 01/28/2023:  Conclusions:  Normal LV systolic function with EF 60-65% Left ventricular cavity is normal in size. Normal global wall motion Normal LV wall thickness Right ventricle cavity is normal in size Normal right ventricular function Normal RVSP  Right heart cath 04/16/2022:  Findings:   RA = 10 RV = 55/15 PA = 51/22 (32) PCW = 14 Fick cardiac output/index = 7.0/3.0 PVR = 2.6 FA sat = 98% PA sat = 68%, 71% SVC sat = 62% PAPi = 2.9   Assessment:   1. Mild PAH with normal cardiac output 2. Numbers unchanged from previous   Plan/Discussion:   Continue current therapy.   Holter monitor 11/08/2021:  1. Sinus rhythm -  avg HR of 81 bpm.  2. First Degree AV Block was present.  3. Five runs of SVT occurred, the run with the fastest interval lasting 15 beats  with a max rate of 174 bpm (avg 142 bpm); the run with the fastest interval was also the longest. 4. Rare PACs and PVCs.  5. Ventricular Bigeminy was present.  Past Medical History:  Diagnosis Date   Anemia    Anxiety    Arthritis    CHF exacerbation (HCC) 08/21/2017   Depression    Difficult intubation    See [facility] anesthesia records in Care Everywhere   Diverticulitis    Dyspnea    with exertion   Family history of adverse reaction to anesthesia    " My mother stopped breathing during procedure"   GERD (gastroesophageal reflux disease)    PMH; resolved   Hyperlipidemia    Hypertension    Obesity    Plantar fasciitis, left    Pneumonia     Port-A-Cath in place    uses d/t being a "Hard Stick"   Pre-diabetes    Pulmonary embolism (HCC)    Pulmonary hypertension (HCC)    Sleep apnea    wears CPAP # 9   Wears glasses     Past Surgical History:  Procedure Laterality Date   ABDOMINAL HYSTERECTOMY     BIOPSY  02/26/2020   Procedure: BIOPSY;  Surgeon: Kerin Salen, MD;  Location: [facility] ENDOSCOPY;  Service: Gastroenterology;;   BREAST REDUCTION SURGERY     CHOLECYSTECTOMY     COLONOSCOPY W/ BIOPSIES AND POLYPECTOMY     EMBOLECTOMY N/A    pulmonary embolectomy   ESOPHAGOGASTRODUODENOSCOPY N/A 02/26/2020   Procedure: ESOPHAGOGASTRODUODENOSCOPY (EGD);  Surgeon: Kerin Salen, MD;  Location: [facility] ENDOSCOPY;  Service: Gastroenterology;  Laterality: N/A;   FOOT SURGERY Left    bone spur removal   FRACTURE SURGERY     right ankle   GASTROCNEMIUS RECESSION Left 02/12/2019   Procedure: LEFT GASTROCNEMIUS RECESSION, PLANTAR FASCIA RELEASE;  Surgeon: Nadara Mustard, MD;  Location: [facility] OR;  Service: Orthopedics;  Laterality: Left;   HEMORROIDECTOMY     IVC FILTER INSERTION     for saddle embolus   KNEE ARTHROSCOPY W/ DEBRIDEMENT Bilateral    REDUCTION MAMMAPLASTY Bilateral    RIGHT HEART CATH N/A 10/28/2016   Procedure: Right Heart Cath;  Surgeon: Dolores Patty, MD;  Location: [facility] INVASIVE CV LAB;  Service: Cardiovascular;  Laterality: N/A;   RIGHT HEART CATH N/A 08/26/2017   Procedure: RIGHT HEART CATH;  Surgeon: Dolores Patty, MD;  Location: [facility] INVASIVE CV LAB;  Service: Cardiovascular;  Laterality: N/A;   RIGHT HEART CATH N/A 07/05/2019   Procedure: RIGHT HEART CATH;  Surgeon: Dolores Patty, MD;  Location: [facility] INVASIVE CV LAB;  Service: Cardiovascular;  Laterality: N/A;   RIGHT HEART CATH N/A 07/24/2020   Procedure: RIGHT HEART CATH;  Surgeon: Dolores Patty, MD;  Location: [facility] INVASIVE CV LAB;  Service: Cardiovascular;  Laterality: N/A;   RIGHT HEART CATH N/A 04/16/2022   Procedure: RIGHT HEART CATH;  Surgeon:  Dolores Patty, MD;  Location: [facility] INVASIVE CV LAB;  Service: Cardiovascular;  Laterality: N/A;   ULTRASOUND GUIDANCE FOR VASCULAR ACCESS  08/26/2017   Procedure: Ultrasound Guidance For Vascular Access;  Surgeon: Dolores Patty, MD;  Location: [facility] INVASIVE CV LAB;  Service: Cardiovascular;;   WISDOM TOOTH EXTRACTION      MEDICATIONS:  albuterol (VENTOLIN HFA) 108 (90 Base) MCG/ACT inhaler   atorvastatin (LIPITOR) 20 MG tablet   Azelastine HCl 137 MCG/SPRAY SOLN   cyclobenzaprine (FLEXERIL) 10 MG tablet   docusate sodium (COLACE) 100 MG capsule   furosemide (LASIX) 40 MG tablet   HYDROcodone-acetaminophen (NORCO) 10-325 MG tablet   lanolin-mineral oil (BABY OIL) OIL   montelukast (SINGULAIR) 10 MG tablet   omeprazole (PRILOSEC) 40 MG capsule   ondansetron (ZOFRAN) 4 MG tablet   OXYGEN   polyethylene glycol (MIRALAX / GLYCOLAX) 17 g packet   potassium chloride (KLOR-CON) 10 MEQ tablet   Riociguat (ADEMPAS) 2 MG TABS   rivaroxaban (XARELTO) 20 MG TABS tablet   spironolactone (ALDACTONE) 25 MG tablet   No current facility-administered medications for this encounter.   Marcille Blanco [facility] Surgical Short Stay/Anesthesiology [facility] Phone [phone] 07/08/2023 10:10 AM

## 2023-06-26 NOTE — Progress Notes (Incomplete Revision)
DISCUSSION: [name] [name] is a 62 yo female who presents to PAT prior to LAPAROSCOPIC LEFT AND POSSIBLE RIGHT INGUINAL HERNIA and PERIUMBILICAL INCISIONAL HERNIA on 07/09/23 with Dr. Michaell Cowing. PMH of pulmonary HTN, CHF, HTN, chronic DOE, OSA (has not been using CPAP), difficult vascular access (has port-a-cath), GERD, obesity (BMI 53), pre-diabetes, anxiety, depression.  Prior anesthesia complication includes difficult intubation. Mallampati 4. Patient states she has a card. Advised to bring it with her DOS. See anesthesia note from 03/29/2016: "Placed in OR; Blade size: 4; Airway size (mm): 8.0; Cuffed; Insertion attempts: 1; Secured at 24 cm; Measured from Lips; Complex? Yes; Mask airway: Severe obstruction" First intubating attempt by CTA fellow with video laryngoscope D blade which shows very poor view with excessive soft tissues with poor anatomic landmark. Unable to identify epiglottis, patient was mask by two person with oral airway then re-attempted with combined D blade and brpnchoscope and was unsuccessful due to vocal cord closure. 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 She reported some abdominal swelling and takes extra Lasix 1-2x a week. Has not been using CPAP and uses 2L of O2 at night. Repeat echo was done on 01/28/23 which showed normal EF, no valvular abnormalities. She has chronic DOE which is unchanged. Cardiac clearance received 05/09/23:  " Preoperative Cardiovascular Risk Assessment: According to the Revised Cardiac Risk Index (RCRI), her Perioperative Risk of Major Cardiac Event is (%): 0.9. Her Functional Capacity in METs is: 5.29 according to the [facility] Activity Status Index (DASI). Therefore, based on ACC/AHA guidelines, patient would be at  acceptable risk for the planned procedure without further cardiovascular testing. Per office protocol, patient can hold Xarelto for 2 days prior to procedure.  Please resume Xarelto as soon as possible postprocedure, at the discretion of the surgeon."  She followed up in CHF clinic on 07/03/23 and reported increased DOE to them. She was switched from Lasix to Torsemide. Per provider at that visit  "Preoperative cardiac risk assessment   She is planning to undergo laparoscopic bilateral inguinal hernia and periumbilical hernia repair with Dr. Michaell Cowing on 10/30. Her risk of perioperative complications is moderately increased in view of her cardiac history. Would avoid general anesthesia if at all possible to minimize risk. She has been taking Ozempic. Instructed her to contact her surgical team to see if this needs to be held perioperatively."  Per Patient last day of Ozempic was 10/22. She understands to not take 10/29 dose.   VS: BP 128/62 Comment: right forearm sitting  Pulse 67   Temp 36.7 C (Oral)   Resp (!) 22   Ht 5\' 3"  (1.6 m)   Wt (!) 136.5 kg   SpO2 100%   BMI 53.32 kg/m   PROVIDERS: Hillery Aldo, NP Cardiology: Arvilla Meres, MD  LABS: Labs reviewed: Acceptable for surgery. (all labs ordered are listed, but only abnormal results are displayed)  Labs Reviewed  COMPREHENSIVE METABOLIC PANEL - Abnormal; Notable for the following components:      Result Value   Creatinine, Ser 1.07 (*)    AST 13 (*)    GFR, Estimated 59 (*)    All other components within normal limits  CBC - Abnormal; Notable for the following components:   RBC 5.81 (*)    MCV 69.4 (*)  MCH 21.9 (*)    RDW 18.1 (*)    All other components within normal limits     IMAGES:  CXR 06/04/23:  FINDINGS: Cardiac silhouette is prominent. There is central pulmonary arterial prominence which may indicate pulmonary arterial hypertension. This is a stable finding. No pneumothorax. There is a small  right-sided pleural effusion. Normal pulmonary vasculature. Median sternotomy wires. Calcified aorta. Right-sided Port-A-Cath tip distal SVC.   IMPRESSION: Enlarged cardiac silhouette. Central pulmonary arterial prominence suggesting pulmonary arterial hypertension. Small pleural effusion on the right.     EKG:   CV:  Echo 01/28/2023:  Conclusions:  Normal LV systolic function with EF 60-65% Left ventricular cavity is normal in size. Normal global wall motion Normal LV wall thickness Right ventricle cavity is normal in size Normal right ventricular function Normal RVSP  Right heart cath 04/16/2022:  Findings:   RA = 10 RV = 55/15 PA = 51/22 (32) PCW = 14 Fick cardiac output/index = 7.0/3.0 PVR = 2.6 FA sat = 98% PA sat = 68%, 71% SVC sat = 62% PAPi = 2.9   Assessment:   1. Mild PAH with normal cardiac output 2. Numbers unchanged from previous   Plan/Discussion:   Continue current therapy.   Holter monitor 11/08/2021:  1. Sinus rhythm -  avg HR of 81 bpm.  2. First Degree AV Block was present.  3. Five runs of SVT occurred, the run with the fastest interval lasting 15 beats  with a max rate of 174 bpm (avg 142 bpm); the run with the fastest interval was also the longest. 4. Rare PACs and PVCs.  5. Ventricular Bigeminy was present.  Past Medical History:  Diagnosis Date   Anemia    Anxiety    Arthritis    CHF exacerbation (HCC) 08/21/2017   Depression    Difficult intubation    See [facility] anesthesia records in Care Everywhere   Diverticulitis    Dyspnea    with exertion   Family history of adverse reaction to anesthesia    " My mother stopped breathing during procedure"   GERD (gastroesophageal reflux disease)    PMH; resolved   Hyperlipidemia    Hypertension    Obesity    Plantar fasciitis, left    Pneumonia    Port-A-Cath in place    uses d/t being a "Hard Stick"   Pre-diabetes    Pulmonary embolism (HCC)    Pulmonary hypertension (HCC)     Sleep apnea    wears CPAP # 9   Wears glasses     Past Surgical History:  Procedure Laterality Date   ABDOMINAL HYSTERECTOMY     BIOPSY  02/26/2020   Procedure: BIOPSY;  Surgeon: Kerin Salen, MD;  Location: [facility] ENDOSCOPY;  Service: Gastroenterology;;   BREAST REDUCTION SURGERY     CHOLECYSTECTOMY     COLONOSCOPY W/ BIOPSIES AND POLYPECTOMY     EMBOLECTOMY N/A    pulmonary embolectomy   ESOPHAGOGASTRODUODENOSCOPY N/A 02/26/2020   Procedure: ESOPHAGOGASTRODUODENOSCOPY (EGD);  Surgeon: Kerin Salen, MD;  Location: [facility] ENDOSCOPY;  Service: Gastroenterology;  Laterality: N/A;   FOOT SURGERY Left    bone spur removal   FRACTURE SURGERY     right ankle   GASTROCNEMIUS RECESSION Left 02/12/2019   Procedure: LEFT GASTROCNEMIUS RECESSION, PLANTAR FASCIA RELEASE;  Surgeon: Nadara Mustard, MD;  Location: [facility] OR;  Service: Orthopedics;  Laterality: Left;   HEMORROIDECTOMY     IVC FILTER INSERTION     for saddle embolus  KNEE ARTHROSCOPY W/ DEBRIDEMENT Bilateral    REDUCTION MAMMAPLASTY Bilateral    RIGHT HEART CATH N/A 10/28/2016   Procedure: Right Heart Cath;  Surgeon: Dolores Patty, MD;  Location: [facility] INVASIVE CV LAB;  Service: Cardiovascular;  Laterality: N/A;   RIGHT HEART CATH N/A 08/26/2017   Procedure: RIGHT HEART CATH;  Surgeon: Dolores Patty, MD;  Location: [facility] INVASIVE CV LAB;  Service: Cardiovascular;  Laterality: N/A;   RIGHT HEART CATH N/A 07/05/2019   Procedure: RIGHT HEART CATH;  Surgeon: Dolores Patty, MD;  Location: [facility] INVASIVE CV LAB;  Service: Cardiovascular;  Laterality: N/A;   RIGHT HEART CATH N/A 07/24/2020   Procedure: RIGHT HEART CATH;  Surgeon: Dolores Patty, MD;  Location: [facility] INVASIVE CV LAB;  Service: Cardiovascular;  Laterality: N/A;   RIGHT HEART CATH N/A 04/16/2022   Procedure: RIGHT HEART CATH;  Surgeon: Dolores Patty, MD;  Location: [facility] INVASIVE CV LAB;  Service: Cardiovascular;  Laterality: N/A;   ULTRASOUND GUIDANCE FOR VASCULAR  ACCESS  08/26/2017   Procedure: Ultrasound Guidance For Vascular Access;  Surgeon: Dolores Patty, MD;  [facility] INVASIVE CV LAB;  Service: Cardiovascular;;   WISDOM TOOTH EXTRACTION      MEDICATIONS:  albuterol (VENTOLIN HFA) 108 (90 Base) MCG/ACT inhaler   atorvastatin (LIPITOR) 20 MG tablet   Azelastine HCl 137 MCG/SPRAY SOLN   cyclobenzaprine (FLEXERIL) 10 MG tablet   docusate sodium (COLACE) 100 MG capsule   furosemide (LASIX) 40 MG tablet   HYDROcodone-acetaminophen (NORCO) 10-325 MG tablet   lanolin-mineral oil (BABY OIL) OIL   montelukast (SINGULAIR) 10 MG tablet   omeprazole (PRILOSEC) 40 MG capsule   ondansetron (ZOFRAN) 4 MG tablet   OXYGEN   polyethylene glycol (MIRALAX / GLYCOLAX) 17 g packet   potassium chloride (KLOR-CON) 10 MEQ tablet   Riociguat (ADEMPAS) 2 MG TABS   rivaroxaban (XARELTO) 20 MG TABS tablet   spironolactone (ALDACTONE) 25 MG tablet   No current facility-administered medications for this encounter.

## 2023-06-26 NOTE — Anesthesia Preprocedure Evaluation (Addendum)
Anesthesia Evaluation  Patient identified by MRN, date of birth, ID band Patient awake    Reviewed: Allergy & Precautions, NPO status , Patient's Chart, lab work & pertinent test results  History of Anesthesia Complications (+) DIFFICULT AIRWAY, Family history of anesthesia reaction and history of anesthetic complications  Airway Mallampati: III  TM Distance: <3 FB Neck ROM: Full    Dental  (+) Teeth Intact, Dental Advisory Given   Pulmonary sleep apnea and Continuous Positive Airway Pressure Ventilation , PE   Pulmonary exam normal breath sounds clear to auscultation       Cardiovascular hypertension, pulmonary hypertension+ Peripheral Vascular Disease (s/p IVC filter) and +CHF  Normal cardiovascular exam Rhythm:Regular Rate:Normal     Neuro/Psych  PSYCHIATRIC DISORDERS Anxiety Depression     Neuromuscular disease CVA    GI/Hepatic Neg liver ROS,GERD  Medicated,,left and possible right inguinal hernia periumbilical incisional hernia   Endo/Other    Morbid obesity (BMI 53)  Renal/GU negative Renal ROS     Musculoskeletal  (+) Arthritis ,    Abdominal   Peds  Hematology  (+) Blood dyscrasia (Xarelto)   Anesthesia Other Findings   Reproductive/Obstetrics                             Anesthesia Physical Anesthesia Plan  ASA: 4  Anesthesia Plan: General   Post-op Pain Management: Tylenol PO (pre-op)* and Gabapentin PO (pre-op)*   Induction: Intravenous  PONV Risk Score and Plan: 3 and Midazolam, Dexamethasone and Ondansetron  Airway Management Planned: Oral ETT and Video Laryngoscope Planned  Additional Equipment:   Intra-op Plan:   Post-operative Plan: Extubation in OR  Informed Consent: I have reviewed the patients History and Physical, chart, labs and discussed the procedure including the risks, benefits and alternatives for the proposed anesthesia with the patient or  authorized representative who has indicated his/her understanding and acceptance.     Dental advisory given  Plan Discussed with: CRNA  Anesthesia Plan Comments: (See PAT note from 10/16 by K Gekas PA-C. Patient with difficult airway )        Anesthesia Quick Evaluation

## 2023-06-27 ENCOUNTER — Other Ambulatory Visit (HOSPITAL_COMMUNITY): Payer: Self-pay

## 2023-06-30 NOTE — Progress Notes (Incomplete)
Pulmonary Hypertension Clinic Note   Primary Care: Hillery Aldo, NP Primary HF MD: Dr Gala Romney   HPI: [name] [name] is a 62 y.o. 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   In [place] in 09/2015 diagnosed with saddle PE and started on anticoagulation. She was very briefly on warfarin but quickly switched to Xarelto.   She was referred to [facility] in 4/17. Echocardiogram showed normal LVEF 55-60%, normal LA, normal RV size, RVSP estimated as 70 mm Hg. She further was referred to [facility] and saw Dr. Larene Beach on 03/14/2016 where patient had several studies including V/Q scan which found high prob for PE; studies c/w CTEPH. It was felt that her chronic chest pain was caused by chronic PE. RHC/LHC/Pulm Angiogram 03/2016 confirmed diagnosis of CTEPH. She underwent pulmonary thromboembolectomy 03/31/2016 at [facility] with IVC filter placement. Pre-op cath with no CAD.   Seen by Dr. Larene Beach in 04/2016 for follow up. He felt like her heart size was decreasing. Plan was for lifelong OAC and ECHO and V/Q in 4 months.  CT angio at [facility] on 05/15/16 showed " persistent but partially recanalized PE within right lower lobe pulmonary artery. No new PE identified. "  She was admitted to [facility] in 06/2016 for continued chest pain and parasthesias. V/Q scan on 06/27/16 showed prominent ventilation perfusion mismatch in right c/w chronic PE. Head CT negative. She was continued on Xarelto.  Admitted to [facility] for suicidal ideation. She was then sent to [facility] for additional treatment.   Had repeat RHC in 10/20 which showed mild PAH (50/16 (28)) with high output (8.1l/min) and normal PVR (2.0 WU). Not candidate for additional selective pulmonary artery vasodialtors   Sleep study 9/22: AHI 4.4  In 11/21 had worsening  symptoms. RHC unchanged PA 52/18 (30). PVR 2.8  Remains on Adempas 2 mg tid.   Last OV, 2/23, reported NYHA Class III symptoms w/ stable volume status. Was continued Adempas at 2mg  tid. BP was too low to add PDE-5. She was referred to pulmonary rehab and ordered to get repeat echo. She canceled most of her pulm rehab sessions  Follow up 7/23, symptomatically worse NYHA IIIb and volume ok. Arrangeed for echo and RHC.  RHC 8/23 showed mild PAH and normal CO. Largely unchanged from previous.   Today she returns for Pulmonary Hypertension/HF follow up.Overall feeling fine. Denies SOB/PND/Orthopnea. Appetite ok. No fever or chills. Weight at home  pounds. Taking all medications   Cardiac Studies - RHC 8/23):  RA = 10 RV = 55/15 PA = 51/22 (32) PCW = 14 Fick cardiac output/index = 7.0/3.0 PVR = 2.6 FA sat = 98% PA sat = 68%, 71% SVC sat = 62% PAPi = 2.9 Assessment: 1. Mild PAH with normal cardiac output 2. Numbers unchanged from previous   - RHC (11/21) RA = 8 RV = 49/10 PA = 52/18 (30) PCW = 12 Fick cardiac output/index = 6.5/2.7 PVR = 2.8 WU FA sat = 98% PA sat = 67%, 68% PaPi = 3.6  - RHC (10/20) RA = 7 RV = 50/12 PA = 50/16 (28) PCW = 11 Fick cardiac output/index = 8.1/3.4 PVR = 2.0 WU FA sat = 99% PA sat = 72%, 77%  - Echo (8/18): EF 55-60%, RV normal, RVSP 20 mmHg  - RHC (2/18) RA =  8 RV = 64/13 PA = 65/19 (35) PCW = 9 Fick cardiac output/index = 7.5/3.1 PVR = 3.5 WU Ao sat = 96% PA sat = 68%,70% SVC sat = 71%  Assessment 1. Mild residual PAH in the setting of CTEPH s/p pulmonary thrombolectomy 2. Normal cardiac output 3. Normal left-sided filling pressures   - V/Q scan (06/27/16): Prominent ventilation perfusion mismatch noted on the right. Finding suggests high probability right-sided pulmonary embolus in this patient with known right-sided pulmonary embolus.   - CT angio (05/15/16): Persistent but partially recanalized pulmonary embolus within  the right lower lobe pulmonary artery. No new pulmonary embolism is identified.Minimal bibasilar atelectatic changes stable from the prior exam.  - Echo (04/05/16): NORMAL LEFT VENTRICULAR FUNCTION WITH MILD LVH SEVERE RV SYSTOLIC DYSFUNCTION (See above) VALVULAR REGURGITATION: TRIVIAL PR, MILD TR NO VALVULAR STENOSIS TRIVIAL PERICARDIAL EFFUSION Compared with prior Echo study on 03/14/2016: RVSP increased from 50 to 69  - RHC at [facility] (03/25/2016): RA 11, RVSP 68, PA mean 40, PCW 14, PVR 4.7 WU, CI 2.3  - Echo (4/17): EF 55-60%,  - Pulmonary arteries: PA peak pressure: 70 mm Hg (S). - Pericardium, extracardiac: A trivial pericardial effusion was   identified.   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     atorvastatin (LIPITOR) 20 MG tablet Take 1 tablet (20 mg total) by mouth daily at 6 PM. 90 tablet 1   Azelastine HCl 137 MCG/SPRAY SOLN Place 1 spray into both nostrils daily as needed (allergies).     cyclobenzaprine (FLEXERIL) 10 MG tablet Take 10 mg by mouth every 12 (twelve) hours as needed for muscle spasms.     docusate sodium  (COLACE) 100 MG capsule Take 1 capsule (100 mg total) by mouth every 12 (twelve) hours. 30 capsule 0   furosemide (LASIX) 40 MG tablet TAKE 1 TABLET BY MOUTH ONCE DAILY TAKE  AN  EXTRA  TABLET  ONCE  IN  THE  AFTERNOON  IF  NEEDED  FOR  WEIGHT  GAIN  OF  3LBS  OR  MORE 180 tablet 3   HYDROcodone-acetaminophen (NORCO) 10-325 MG tablet Take 1 tablet by mouth 3 (three) times daily as needed for moderate pain.     lanolin-mineral oil (BABY OIL) OIL 1-2 drops in the morning and at bedtime. Into ears     montelukast (SINGULAIR) 10 MG tablet Take 10 mg by mouth in the morning.  omeprazole (PRILOSEC) 40 MG capsule Take 40 mg by mouth 2 (two) times daily.     ondansetron (ZOFRAN) 4 MG tablet Take 4 mg by mouth every 8 (eight) hours as needed for nausea/vomiting.     OXYGEN Inhale 2 L into the lungs See admin instructions. At bedtime and during the day when resting     polyethylene glycol (MIRALAX / GLYCOLAX) 17 g packet Take 17 g by mouth in the morning.     potassium chloride (KLOR-CON) 10 MEQ tablet Take 10 mEq by mouth every morning.     Riociguat (ADEMPAS) 2 MG TABS Take 2 mg by mouth 3 (three) times daily. 30 tablet 11   rivaroxaban (XARELTO) 20 MG TABS tablet Take 1 tablet (20 mg total) by mouth daily with supper. 90 tablet 3   spironolactone (ALDACTONE) 25 MG tablet Take 0.5 tablets (12.5 mg total) by mouth daily. 45 tablet 3   No current facility-administered medications for this visit.   Allergies  Allergen Reactions   Neomycin Itching, Swelling and Other (See Comments)    Topical--burning/itching/swelling    Social History   Socioeconomic History   Marital status: Married    Spouse name: Not on file   Number of children: Not on file   Years of education: Not on file   Highest education level: 12th grade  Occupational History   Occupation: Disability  Tobacco Use   Smoking status: Never   Smokeless tobacco: Never  Vaping Use   Vaping status: Never Used  Substance and Sexual  Activity   Alcohol use: No   Drug use: No   Sexual activity: Yes    Partners: Male    Birth control/protection: Surgical  Other Topics Concern   Not on file  Social History Narrative   Not on file   Social Determinants of Health   Financial Resource Strain: High Risk (10/25/2022)   Overall Financial Resource Strain (CARDIA)    Difficulty of Paying Living Expenses: Hard  Food Insecurity: Not on file  Transportation Needs: Not on file  Physical Activity: Not on file  Stress: Not on file  Social Connections: Not on file  Intimate Partner Violence: Not on file   Family History  Problem Relation Age of Onset   Lung cancer Maternal Grandmother    Dementia Mother    Dementia Father    Anxiety disorder Sister    Bipolar disorder Sister    Drug abuse Sister    Sexual abuse Maternal Aunt    Anxiety disorder Cousin    Anxiety disorder Sister    Bipolar disorder Sister    Drug abuse Sister    Breast cancer Neg Hx    There were no vitals taken for this visit.  Wt Readings from Last 3 Encounters:  06/25/23 (!) 136.5 kg (301 lb)  03/27/23 (!) 139.7 kg (307 lb 15.7 oz)  10/29/22 (!) 139.7 kg (308 lb)   PHYSICAL EXAM: General:  Well appearing. No resp difficulty HEENT: normal Neck: supple. no JVD. Carotids 2+ bilat; no bruits. No lymphadenopathy or thryomegaly appreciated. Cor: PMI nondisplaced. Regular rate & rhythm. No rubs, gallops or murmurs. Lungs: clear Abdomen: soft, nontender, nondistended. No hepatosplenomegaly. No bruits or masses. Good bowel sounds. Extremities: no cyanosis, clubbing, rash, edema Neuro: alert & orientedx3, cranial nerves grossly intact. moves all 4 extremities w/o difficulty. Affect pleasant    ASSESSMENT & PLAN: 1. 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 Septum flat.  - RHC (10/20): mild PAH 50/16 (28) with high output (8.1l/min) and normal PVR (2.0 WU). Not candidate for additional selective pulmonary artery vasodilators. - RHC (11/21): PA 52/18 (30) PCW 12 PVR 2.8 WU - RHC (8/23): RA 10, PA 51/22 (32), PCW 14, PVR 2.6 WU - Stable NYHA II-early III, suspect body habitus and deconditioning play a role. Volume looks OK today. - Increase spiro to 12.5 mg daily. - Continue Lasix 40 mg daily. OK to take extra 40 PRN. - Continue Adempas 2 mg tid and Xarelto. - today (10/29/22) 237.7 meters; 95-99% on room air.  2. Chronic chest pain  - Has chronic PE s/p thrombectomy, LHC performed at [facility] 7/17 with no CAD.  - Resolved  3. Palpitations - Zio (2/23): 5 runs SVT. Rare PVCS - Resolved.  4. Fibromyalgia/Rheumatoid Arthritis  - Follows with Rheumatology.  - No change.    5. Morbid obesity with OHS  - There is no height or weight on file to calculate BMI. - Off Ozempic. Weight has plateau.  6. OSA - Followed by Dr. Craige Cotta - Sleep study with AHI 4.4  - Now off CPAP. Wearing O2 at night  7. Depression/anxiety - Receiving outpatient therapy.   - Per PCP    Eleena Grater NP-C   06/30/23

## 2023-07-01 ENCOUNTER — Other Ambulatory Visit: Payer: Self-pay

## 2023-07-01 ENCOUNTER — Other Ambulatory Visit: Payer: Medicare PPO

## 2023-07-01 DIAGNOSIS — E039 Hypothyroidism, unspecified: Secondary | ICD-10-CM

## 2023-07-03 ENCOUNTER — Ambulatory Visit (HOSPITAL_COMMUNITY)
Admission: RE | Admit: 2023-07-03 | Discharge: 2023-07-03 | Disposition: A | Discharge status: Home / Self Care | Payer: Medicare PPO | Source: Ambulatory Visit | Attending: Adult Health | Admitting: Adult Health

## 2023-07-03 ENCOUNTER — Encounter (HOSPITAL_COMMUNITY): Payer: Self-pay

## 2023-07-03 VITALS — BP 116/74 | HR 96 | Wt 309.6 lb

## 2023-07-03 DIAGNOSIS — I4510 Unspecified right bundle-branch block: Secondary | ICD-10-CM | POA: Diagnosis not present

## 2023-07-03 DIAGNOSIS — G4733 Obstructive sleep apnea (adult) (pediatric): Secondary | ICD-10-CM | POA: Diagnosis not present

## 2023-07-03 DIAGNOSIS — G894 Chronic pain syndrome: Secondary | ICD-10-CM | POA: Diagnosis not present

## 2023-07-03 DIAGNOSIS — R9431 Abnormal electrocardiogram [ECG] [EKG]: Secondary | ICD-10-CM | POA: Diagnosis not present

## 2023-07-03 DIAGNOSIS — Z7901 Long term (current) use of anticoagulants: Secondary | ICD-10-CM | POA: Insufficient documentation

## 2023-07-03 DIAGNOSIS — I5032 Chronic diastolic (congestive) heart failure: Secondary | ICD-10-CM | POA: Insufficient documentation

## 2023-07-03 DIAGNOSIS — F418 Other specified anxiety disorders: Secondary | ICD-10-CM | POA: Insufficient documentation

## 2023-07-03 DIAGNOSIS — I110 Hypertensive heart disease with heart failure: Secondary | ICD-10-CM | POA: Diagnosis not present

## 2023-07-03 DIAGNOSIS — G4734 Idiopathic sleep related nonobstructive alveolar hypoventilation: Secondary | ICD-10-CM

## 2023-07-03 DIAGNOSIS — Z01810 Encounter for preprocedural cardiovascular examination: Secondary | ICD-10-CM | POA: Diagnosis not present

## 2023-07-03 DIAGNOSIS — I2721 Secondary pulmonary arterial hypertension: Secondary | ICD-10-CM | POA: Insufficient documentation

## 2023-07-03 DIAGNOSIS — I10 Essential (primary) hypertension: Secondary | ICD-10-CM | POA: Diagnosis present

## 2023-07-03 DIAGNOSIS — K4020 Bilateral inguinal hernia, without obstruction or gangrene, not specified as recurrent: Secondary | ICD-10-CM | POA: Diagnosis not present

## 2023-07-03 DIAGNOSIS — M069 Rheumatoid arthritis, unspecified: Secondary | ICD-10-CM | POA: Diagnosis not present

## 2023-07-03 DIAGNOSIS — M797 Fibromyalgia: Secondary | ICD-10-CM | POA: Insufficient documentation

## 2023-07-03 DIAGNOSIS — Z6841 Body Mass Index (BMI) 40.0 and over, adult: Secondary | ICD-10-CM | POA: Insufficient documentation

## 2023-07-03 DIAGNOSIS — Z95828 Presence of other vascular implants and grafts: Secondary | ICD-10-CM | POA: Diagnosis not present

## 2023-07-03 DIAGNOSIS — Z86711 Personal history of pulmonary embolism: Secondary | ICD-10-CM | POA: Insufficient documentation

## 2023-07-03 DIAGNOSIS — G4736 Sleep related hypoventilation in conditions classified elsewhere: Secondary | ICD-10-CM | POA: Diagnosis not present

## 2023-07-03 DIAGNOSIS — I2724 Chronic thromboembolic pulmonary hypertension: Secondary | ICD-10-CM | POA: Diagnosis not present

## 2023-07-03 MED ORDER — TORSEMIDE 20 MG PO TABS: 20.0000 mg | ORAL_TABLET | Freq: Every day | ORAL | 5 refills | Status: DC

## 2023-07-03 MED ORDER — RIVAROXABAN 20 MG PO TABS: 20.0000 mg | ORAL_TABLET | Freq: Every day | ORAL | 3 refills | Status: DC

## 2023-07-03 MED ORDER — POTASSIUM CHLORIDE ER 10 MEQ PO TBCR: 20.0000 meq | EXTENDED_RELEASE_TABLET | Freq: Every morning | ORAL | 5 refills | Status: DC

## 2023-07-03 NOTE — Addendum Note (Signed)
Encounter addended by: Chinita Pester, CMA on: 07/03/2023 12:09 PM  Actions taken: Order list changed, Diagnosis association updated

## 2023-07-03 NOTE — Patient Instructions (Addendum)
EKG done today.  Labs done today. We will contact you only if your labs are abnormal.  STOP taking Lasix  START Torsemide 20mg  (1 tablet) by mouth daily. You may take 1 additional tablet as needed for swelling or a weight gain of 3 pounds or more in 24 hours or 5 pounds in 1 week. FOR 3 DAYS ONLY TAKE 2 TABLETS BY MOUTH DAILY.   INCREASE Potassium to (2 tablets) by mouth daily.   No other medication changes were made. Please continue all current medications as prescribed.  Your physician recommends that you schedule a follow-up appointment in: 1 week  If you have any questions or concerns before your next appointment please send [place] a message through [place] or call our office at [phone].    TO LEAVE A MESSAGE FOR THE NURSE SELECT OPTION 2, PLEASE LEAVE A MESSAGE INCLUDING: YOUR NAME DATE OF BIRTH CALL BACK NUMBER REASON FOR CALL**this is important as we prioritize the call backs  YOU WILL RECEIVE A CALL BACK THE SAME DAY AS LONG AS YOU CALL BEFORE 4:00 PM   Do the following things EVERYDAY: Weigh yourself in the morning before breakfast. Write it down and keep it in a log. Take your medicines as prescribed Eat low salt foods--Limit salt (sodium) to 2000 mg per day.  Stay as active as you can everyday Limit all fluids for the day to less than 2 liters   At the [facility], you and your health needs are our priority. As part of our continuing mission to provide you with exceptional heart care, we have created designated Provider Care Teams. These Care Teams include your primary Cardiologist (physician) and Advanced Practice Providers (APPs- Physician Assistants and Nurse Practitioners) who all work together to provide you with the care you need, when you need it.   You may see any of the following providers on your designated Care Team at your next follow up: Dr Arvilla Meres Dr Marca Ancona Dr. Marcos Eke, NP Robbie Lis,  [place] [facility] [place], [place] Brynda Peon, NP Karle Plumber, PharmD   Please be sure to bring in all your medications bottles to every appointment.    Thank you for choosing [facility]

## 2023-07-07 NOTE — Progress Notes (Signed)
Pulmonary Hypertension Clinic Note   Primary Care: Hillery Aldo, NP Primary HF MD: Dr Gala Romney   HPI: [name] [name] is a 62 y.o. 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   In 09/2015 diagnosed with saddle PE in [place] and started on anticoagulation. She was very briefly on warfarin but quickly switched to Xarelto.   She was referred to [facility] in 4/17. Echocardiogram showed normal LVEF 55-60%, normal LA, normal RV size, RVSP estimated as 70 mm Hg. She further was referred to [facility] and saw Dr. Larene Beach on 03/14/2016 where patient had several studies including V/Q scan which found high prob for PE; studies c/w CTEPH. It was felt that her chronic chest pain was caused by chronic PE. RHC/LHC/Pulm Angiogram 03/2016 confirmed diagnosis of CTEPH. She underwent pulmonary thromboembolectomy 03/31/2016 at [facility] with IVC filter placement. Pre-op cath with no CAD.   Seen by Dr. Larene Beach in 04/2016 for follow up. He felt like her heart size was decreasing. Plan was for lifelong OAC.  CT angio at [facility] 05/15/16 showed " persistent but partially recanalized PE within right lower lobe pulmonary artery. No new PE identified. "  She was admitted to [facility] in 06/2016 for continued chest pain and parasthesias. V/Q scan 06/27/16 showed prominent ventilation perfusion mismatch in right c/w chronic PE. Head CT negative. She was continued on Xarelto.  Admitted to [facility] for suicidal ideation. She was then sent to [facility] for additional treatment.   RHC in 10/20 demonstrated mild PAH (50/16 (28)) with high output (8.1l/min) and normal PVR (2.0 WU). Not candidate for additional selective pulmonary artery vasodialtors   Sleep study 9/22: AHI 4.4  In 11/21 had worsening symptoms. RHC unchanged PA 52/18 (30). PVR 2.8  RHC  8/23 showed mild PAH and normal CO. Largely unchanged from previous.   She is tentatively scheduled for laparoscopic bilateral inguinal hernia and periumbilical hernia repair with Dr. Michaell Cowing on 10/30.   Today she returns for Pulmonary Hypertension/HF follow up.  She has not been feeling well for a couple of weeks. She saw her PCP about a week ago and had a chest xray done which reportedly showed pulmonary edema (report unavailable). Reports more dyspnea with exertion, abdominal bloating and 11 lb weight gain on home scale (289 lb >>301 lb). She feels her heart racing and her O2 sats go down to 93% with light activity, takes several minutes to recover. Using O2 at night. Has not been eating much as ozempic curbs her appetite. She has been compliant with medications. Tried taking an extra tablet of furosemide earlier this week and did not notice any change in UOP. She does not feel she is responding as well to furosemide as she previously did.   Cardiac Studies - RHC 8/23):  RA = 10 RV = 55/15 PA = 51/22 (32) PCW = 14 Fick cardiac output/index = 7.0/3.0 PVR = 2.6 FA sat = 98% PA sat = 68%, 71% SVC sat = 62% PAPi = 2.9 Assessment: 1. Mild PAH with normal cardiac output 2. Numbers unchanged from previous   - RHC (11/21) RA = 8 RV = 49/10 PA = 52/18 (30) PCW = 12 Fick cardiac output/index = 6.5/2.7 PVR = 2.8 WU FA sat = 98% PA sat = 67%, 68% PaPi = 3.6  - RHC (10/20) RA =  7 RV = 50/12 PA = 50/16 (28) PCW = 11 Fick cardiac output/index = 8.1/3.4 PVR = 2.0 WU FA sat = 99% PA sat = 72%, 77%  - Echo (8/18): EF 55-60%, RV normal, RVSP 20 mmHg  - RHC (2/18) RA = 8 RV = 64/13 PA = 65/19 (35) PCW = 9 Fick cardiac output/index = 7.5/3.1 PVR = 3.5 WU Ao sat = 96% PA sat = 68%,70% SVC sat = 71%  Assessment 1. Mild residual PAH in the setting of CTEPH s/p pulmonary thrombolectomy 2. Normal cardiac output 3. Normal left-sided filling pressures   - V/Q scan (06/27/16):  Prominent ventilation perfusion mismatch noted on the right. Finding suggests high probability right-sided pulmonary embolus in this patient with known right-sided pulmonary embolus.   - CT angio (05/15/16): Persistent but partially recanalized pulmonary embolus within the right lower lobe pulmonary artery. No new pulmonary embolism is identified.Minimal bibasilar atelectatic changes stable from the prior exam.  - Echo (04/05/16): NORMAL LEFT VENTRICULAR FUNCTION WITH MILD LVH SEVERE RV SYSTOLIC DYSFUNCTION (See above) VALVULAR REGURGITATION: TRIVIAL PR, MILD TR NO VALVULAR STENOSIS TRIVIAL PERICARDIAL EFFUSION Compared with prior Echo study on 03/14/2016: RVSP increased from 50 to 69  - RHC at [facility] (03/25/2016): RA 11, RVSP 68, PA mean 40, PCW 14, PVR 4.7 WU, CI 2.3  - Echo (4/17): EF 55-60%,  - Pulmonary arteries: PA peak pressure: 70 mm Hg (S). - Pericardium, extracardiac: A trivial pericardial effusion was   identified.   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     alclomethasone (ACLOVATE) 0.05 % cream Apply sparingly to ear canal  openings twice daily for 2 weeks. (Patient not taking: Reported on 07/07/2023)     atorvastatin (LIPITOR) 20 MG tablet Take 1 tablet (20 mg total) by mouth daily at 6 PM. 90 tablet 1   Azelastine HCl 137 MCG/SPRAY SOLN Place 1 spray into both nostrils daily as needed (allergies).     cetirizine (ZYRTEC) 10 MG tablet Take 10 mg by mouth daily.     cyclobenzaprine (FLEXERIL) 10 MG tablet Take 10 mg by mouth every 12 (twelve) hours as needed for muscle spasms.     docusate sodium (COLACE) 100 MG capsule Take 1 capsule (100 mg total) by mouth every 12 (twelve) hours. 30 capsule 0   furosemide (LASIX) 40 MG tablet Take 40 mg by mouth daily.     gabapentin (NEURONTIN) 100 MG capsule Take  100 mg by mouth as needed (nerve pain).     HYDROcodone-acetaminophen (NORCO) 10-325 MG tablet Take 1 tablet by mouth 3 (three) times daily as needed for moderate pain.     lanolin-mineral oil (BABY OIL) OIL 1-2 drops in the morning and at bedtime. Into ears as needed     montelukast (SINGULAIR) 10 MG tablet Take 10 mg by mouth in the morning.     omeprazole (PRILOSEC) 40 MG capsule Take 40 mg by mouth 2 (two) times daily.     ondansetron (ZOFRAN) 4 MG tablet Take 4 mg by mouth every 8 (eight) hours as needed for nausea/vomiting.     OXYGEN Inhale 2 L into the lungs See admin instructions. At bedtime and during the day when resting     polyethylene glycol (MIRALAX / GLYCOLAX) 17 g packet Take 17 g by mouth in the morning.     potassium chloride (KLOR-CON) 10 MEQ tablet Take 2 tablets (20 mEq total) by mouth every morning. (Patient taking differently: Take 10 mEq by mouth every morning.) 60 tablet 5   QUEtiapine (SEROQUEL) 300 MG tablet Take 300 mg by mouth at bedtime as needed (sleep).     Riociguat (ADEMPAS) 2 MG TABS Take 2 mg by mouth 3 (three) times daily. 30 tablet 11   rivaroxaban (XARELTO) 20 MG TABS tablet Take 1 tablet (20 mg total) by mouth daily with supper. 90 tablet 3   Semaglutide, 2 MG/DOSE, (OZEMPIC, 2  MG/DOSE,) 8 MG/3ML SOPN Inject 2 mg into the skin once a week.     spironolactone (ALDACTONE) 25 MG tablet Take 0.5 tablets (12.5 mg total) by mouth daily. 45 tablet 3   torsemide (DEMADEX) 20 MG tablet Take 1 tablet (20 mg total) by mouth daily. You may take 1 additional tablet as needed for swelling or a weight gain of 3 pounds or more in 24 hours or 5 pounds in one week. (Patient not taking: Reported on 07/07/2023) 45 tablet 5   No current facility-administered medications for this visit.   Allergies  Allergen Reactions   Neomycin Itching, Swelling and Other (See Comments)    Topical--burning/itching/swelling    Social History   Socioeconomic History   Marital status: Married    Spouse name: Not on file   Number of children: Not on file   Years of education: Not on file   Highest education level: 12th grade  Occupational History   Occupation: Disability  Tobacco Use   Smoking status: Never   Smokeless tobacco: Never  Vaping Use   Vaping status: Never Used  Substance and Sexual Activity   Alcohol use: No   Drug use: No   Sexual activity: Yes    Partners: Male    Birth control/protection: Surgical  Other Topics Concern   Not on file  Social History Narrative   Not on file   Social Determinants of Health   Financial Resource Strain: High Risk (10/25/2022)   Overall Financial Resource Strain (CARDIA)    Difficulty of Paying Living Expenses: Hard  Food Insecurity: Not on file  Transportation Needs: Not on file  Physical Activity: Not on file  Stress: Not on file  Social Connections: Not on file  Intimate Partner Violence: Not on file   Family History  Problem Relation Age of Onset   Lung cancer Maternal Grandmother    Dementia Mother    Dementia Father    Anxiety disorder Sister    Bipolar disorder Sister    Drug abuse Sister  Sexual abuse Maternal Aunt    Anxiety disorder Cousin    Anxiety disorder Sister    Bipolar disorder Sister    Drug abuse Sister     Breast cancer Neg Hx    There were no vitals taken for this visit.  Wt Readings from Last 3 Encounters:  07/03/23 (!) 140.4 kg (309 lb 9.6 oz)  06/25/23 (!) 136.5 kg (301 lb)  03/27/23 (!) 139.7 kg (307 lb 15.7 oz)   PHYSICAL EXAM: General:  Well appearing. HEENT: normal Neck: supple. Thick neck Cor: PMI nondisplaced. Regular rate & rhythm. No rubs, gallops or murmurs. Lungs: diminished Abdomen: morbidly obese, soft, nontender, nondistended.  Extremities: no cyanosis, clubbing, rash, edema Neuro: alert & oriented x 3. Affect pleasant  Unable to obtain ReDS  ASSESSMENT & PLAN: 1. 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 This is stable dating back to 2017 - RHC (12/18): with very mild PAH and PVR 3.1 WU - Echo (10/19):  EF 55-60% RV not well seen but looks ok No septal flattening  - Echo (9/20): EF 65% RV dilated with normal function. Septum flat.  - RHC (10/20): mild PAH 50/16 (28) with high output (8.1l/min) and normal PVR (2.0 WU). Not candidate for additional selective pulmonary artery vasodilators. - RHC (11/21): PA 52/18 (30) PCW 12 PVR 2.8 WU - RHC (8/23): RA 10, PA 51/22 (32), PCW 14, PVR 2.6 WU - Echo 5/24 EF 60-65%, RVSFx normal, RVSP 24 mmHg  - Worse, NYHA III. More dyspnea with exertion, 11 lb weight gain, and abdominal bloating. Exam quite difficult. Symptoms suggestive of volume overload and reportedly had chest x-ray showing pulmonary edema. However, suspect body habitus and deconditioning play some role. Unable to get ReDS today after 3 attempts.  - Will switch Lasix to Torsemide. Take 40 mg Torsemide daily X 3 days then cut back to 20 mg daily. Increase KCL to 20 mEq daily - Continue spiro 12.5 mg daily. - Continue Adempas 2 mg tid  and Xarelto. - (10/29/22) 237.7 meters; 95-99% on room air. - Labs today  2. Chronic chest pain  - Has chronic PE s/p thrombectomy, LHC performed at [facility] 7/17 with no CAD.  - Resolved  3. Palpitations - Zio (2/23): 5 runs SVT. Rare PVCS  4. Fibromyalgia/Rheumatoid Arthritis  - Follows with Rheumatology.  - No change.    5. Morbid obesity  - There is no height or weight on file to calculate BMI. - Back on Ozempic at some point since last visit. Not currently on medication list. Home weight had started trending down but now increasing w/ concern for volume overload.  6. Nocturnal hypoxemia - Followed by Dr. Craige Cotta - Sleep study with AHI 4.4. Nocturnal hypoxemia noted - Now off CPAP. Wearing O2 at night  7. Depression/anxiety - Receiving outpatient therapy.   - Per PCP  8. Preoperative cardiac risk assessment   She is planning to undergo laparoscopic bilateral inguinal hernia and periumbilical hernia repair with Dr. Michaell Cowing on 10/30. Her risk of perioperative complications is moderately increased in view of her cardiac history.Would avoid general anesthesia if at all possible to minimize risk. She has been taking Ozempic. Instructed her to contact her surgical team to see if this needs to be  held perioperatively.  Follow-up: 07/08/23 with APP to assess volume prior to surgery  Robbie Lis PA-C  07/07/23

## 2023-07-08 ENCOUNTER — Ambulatory Visit (HOSPITAL_COMMUNITY)
Admission: RE | Admit: 2023-07-08 | Discharge: 2023-07-08 | Disposition: A | Discharge status: Home / Self Care | Payer: Medicare PPO | Source: Ambulatory Visit | Attending: Cardiology | Admitting: Cardiology

## 2023-07-08 ENCOUNTER — Ambulatory Visit: Payer: Medicare PPO | Admitting: "Endocrinology

## 2023-07-08 ENCOUNTER — Encounter (HOSPITAL_COMMUNITY): Payer: Self-pay

## 2023-07-08 VITALS — BP 100/60 | HR 88 | Wt 300.0 lb

## 2023-07-08 DIAGNOSIS — I2724 Chronic thromboembolic pulmonary hypertension: Secondary | ICD-10-CM | POA: Diagnosis not present

## 2023-07-08 DIAGNOSIS — I5032 Chronic diastolic (congestive) heart failure: Secondary | ICD-10-CM | POA: Diagnosis not present

## 2023-07-08 DIAGNOSIS — I110 Hypertensive heart disease with heart failure: Secondary | ICD-10-CM | POA: Diagnosis not present

## 2023-07-08 DIAGNOSIS — M797 Fibromyalgia: Secondary | ICD-10-CM | POA: Insufficient documentation

## 2023-07-08 DIAGNOSIS — F418 Other specified anxiety disorders: Secondary | ICD-10-CM | POA: Insufficient documentation

## 2023-07-08 DIAGNOSIS — G894 Chronic pain syndrome: Secondary | ICD-10-CM | POA: Insufficient documentation

## 2023-07-08 DIAGNOSIS — R002 Palpitations: Secondary | ICD-10-CM | POA: Diagnosis not present

## 2023-07-08 DIAGNOSIS — G4733 Obstructive sleep apnea (adult) (pediatric): Secondary | ICD-10-CM | POA: Diagnosis not present

## 2023-07-08 DIAGNOSIS — M069 Rheumatoid arthritis, unspecified: Secondary | ICD-10-CM | POA: Diagnosis not present

## 2023-07-08 DIAGNOSIS — Z6841 Body Mass Index (BMI) 40.0 and over, adult: Secondary | ICD-10-CM | POA: Insufficient documentation

## 2023-07-08 DIAGNOSIS — R0789 Other chest pain: Secondary | ICD-10-CM | POA: Diagnosis not present

## 2023-07-08 DIAGNOSIS — Z7901 Long term (current) use of anticoagulants: Secondary | ICD-10-CM | POA: Insufficient documentation

## 2023-07-08 DIAGNOSIS — I2721 Secondary pulmonary arterial hypertension: Secondary | ICD-10-CM | POA: Diagnosis present

## 2023-07-08 DIAGNOSIS — I10 Essential (primary) hypertension: Secondary | ICD-10-CM | POA: Diagnosis present

## 2023-07-08 DIAGNOSIS — G4736 Sleep related hypoventilation in conditions classified elsewhere: Secondary | ICD-10-CM | POA: Diagnosis not present

## 2023-07-08 DIAGNOSIS — Z86711 Personal history of pulmonary embolism: Secondary | ICD-10-CM | POA: Diagnosis not present

## 2023-07-08 LAB — BASIC METABOLIC PANEL
Anion gap: 9 (ref 5–15)
BUN: 10 mg/dL (ref 6–20)
CO2: 26 mmol/L (ref 22–32)
Calcium: 9.6 mg/dL (ref 8.9–10.3)
Chloride: 105 mmol/L (ref 98–111)
Creatinine, Ser: 1.1 mg/dL — ABNORMAL HIGH (ref 0.44–1.00)
GFR, Estimated: 58 mL/min — ABNORMAL LOW (ref >60)
Glucose, Bld: 94 mg/dL (ref 70–99)
Potassium: 3.9 mmol/L (ref 3.5–5.1)
Sodium: 140 mmol/L (ref 135–145)

## 2023-07-08 NOTE — Patient Instructions (Addendum)
Labs done today. We will contact you only if your labs are abnormal.  No medication changes were made. Please continue all current medications as prescribed.  Your physician recommends that you schedule a follow-up appointment in: 2-3 months with Dr. Gala Romney. Please contact our office in December to schedule a January appointment.   If you have any questions or concerns before your next appointment please send [place] a message through [facility] or call our office at [phone].    TO LEAVE A MESSAGE FOR THE NURSE SELECT OPTION 2, PLEASE LEAVE A MESSAGE INCLUDING: YOUR NAME DATE OF BIRTH CALL BACK NUMBER REASON FOR CALL**this is important as we prioritize the call backs  YOU WILL RECEIVE A CALL BACK THE SAME DAY AS LONG AS YOU CALL BEFORE 4:00 PM   Do the following things EVERYDAY: Weigh yourself in the morning before breakfast. Write it down and keep it in a log. Take your medicines as prescribed Eat low salt foods--Limit salt (sodium) to 2000 mg per day.  Stay as active as you can everyday Limit all fluids for the day to less than 2 liters   At the [facility], you and your health needs are our priority. As part of our continuing mission to provide you with exceptional heart care, we have created designated Provider Care Teams. These Care Teams include your primary Cardiologist (physician) and Advanced Practice Providers (APPs- Physician Assistants and Nurse Practitioners) who all work together to provide you with the care you need, when you need it.   You may see any of the following providers on your designated Care Team at your next follow up: Dr Arvilla Meres Dr Marca Ancona Dr. Marcos Eke, NP Robbie Lis, [place] [facility] [place], [place] Brynda Peon, NP Karle Plumber, PharmD   Please be sure to bring in all your medications bottles to every appointment.    Thank you for choosing [facility]

## 2023-07-09 ENCOUNTER — Ambulatory Visit (HOSPITAL_COMMUNITY): Payer: Medicare PPO | Admitting: Medical

## 2023-07-09 ENCOUNTER — Encounter (HOSPITAL_COMMUNITY)
Admission: RE | Disposition: A | Discharge status: Home / Self Care | Payer: Self-pay | Source: Ambulatory Visit | Attending: Surgery

## 2023-07-09 ENCOUNTER — Ambulatory Visit (HOSPITAL_BASED_OUTPATIENT_CLINIC_OR_DEPARTMENT_OTHER): Payer: Self-pay | Admitting: Anesthesiology

## 2023-07-09 ENCOUNTER — Observation Stay (HOSPITAL_COMMUNITY)
Admission: RE | Admit: 2023-07-09 | Discharge: 2023-07-11 | Disposition: A | Discharge status: Home / Self Care | Payer: Medicare PPO | Source: Ambulatory Visit | Attending: Surgery | Admitting: Surgery

## 2023-07-09 ENCOUNTER — Other Ambulatory Visit (HOSPITAL_COMMUNITY): Payer: Self-pay

## 2023-07-09 ENCOUNTER — Encounter (HOSPITAL_COMMUNITY): Payer: Self-pay | Admitting: Surgery

## 2023-07-09 DIAGNOSIS — J45909 Unspecified asthma, uncomplicated: Secondary | ICD-10-CM | POA: Insufficient documentation

## 2023-07-09 DIAGNOSIS — Z86718 Personal history of other venous thrombosis and embolism: Secondary | ICD-10-CM | POA: Diagnosis not present

## 2023-07-09 DIAGNOSIS — Z7901 Long term (current) use of anticoagulants: Secondary | ICD-10-CM | POA: Diagnosis not present

## 2023-07-09 DIAGNOSIS — I110 Hypertensive heart disease with heart failure: Secondary | ICD-10-CM

## 2023-07-09 DIAGNOSIS — Z79899 Other long term (current) drug therapy: Secondary | ICD-10-CM | POA: Insufficient documentation

## 2023-07-09 DIAGNOSIS — K436 Other and unspecified ventral hernia with obstruction, without gangrene: Secondary | ICD-10-CM

## 2023-07-09 DIAGNOSIS — I509 Heart failure, unspecified: Secondary | ICD-10-CM | POA: Diagnosis not present

## 2023-07-09 DIAGNOSIS — I10 Essential (primary) hypertension: Secondary | ICD-10-CM | POA: Diagnosis not present

## 2023-07-09 DIAGNOSIS — K4020 Bilateral inguinal hernia, without obstruction or gangrene, not specified as recurrent: Secondary | ICD-10-CM | POA: Insufficient documentation

## 2023-07-09 DIAGNOSIS — K432 Incisional hernia without obstruction or gangrene: Principal | ICD-10-CM | POA: Diagnosis present

## 2023-07-09 DIAGNOSIS — Z86711 Personal history of pulmonary embolism: Secondary | ICD-10-CM | POA: Insufficient documentation

## 2023-07-09 DIAGNOSIS — K430 Incisional hernia with obstruction, without gangrene: Principal | ICD-10-CM | POA: Insufficient documentation

## 2023-07-09 HISTORY — PX: INGUINAL HERNIA REPAIR: SHX194

## 2023-07-09 HISTORY — PX: UMBILICAL HERNIA REPAIR: SHX196

## 2023-07-09 LAB — CBC
HCT: 41.2 % (ref 36.0–46.0)
Hemoglobin: 13 g/dL (ref 12.0–15.0)
MCH: 21.8 pg — ABNORMAL LOW (ref 26.0–34.0)
MCHC: 31.6 g/dL (ref 30.0–36.0)
MCV: 69 fL — ABNORMAL LOW (ref 80.0–100.0)
Platelets: 348 10*3/uL (ref 150–400)
RBC: 5.97 MIL/uL — ABNORMAL HIGH (ref 3.87–5.11)
RDW: 17.8 % — ABNORMAL HIGH (ref 11.5–15.5)
WBC: 17.1 10*3/uL — ABNORMAL HIGH (ref 4.0–10.5)
nRBC: 0 % (ref 0.0–0.2)

## 2023-07-09 LAB — GLUCOSE, CAPILLARY: Glucose-Capillary: 145 mg/dL — ABNORMAL HIGH (ref 70–99)

## 2023-07-09 LAB — CREATININE, SERUM
Creatinine, Ser: 0.85 mg/dL (ref 0.44–1.00)
GFR, Estimated: >60 mL/min (ref >60)

## 2023-07-09 SURGERY — REPAIR, HERNIA, INGUINAL, LAPAROSCOPIC
Anesthesia: General

## 2023-07-09 MED ORDER — PROCHLORPERAZINE EDISYLATE 10 MG/2ML IJ SOLN: 5.0000 mg | Freq: Four times a day (QID) | INTRAMUSCULAR | Status: DC | PRN

## 2023-07-09 MED ORDER — PROPOFOL 10 MG/ML IV BOLUS
INTRAVENOUS | Status: AC
  Filled 2023-07-09: qty 20

## 2023-07-09 MED ORDER — ONDANSETRON HCL 4 MG/2ML IJ SOLN
INTRAMUSCULAR | Status: DC | PRN
  Administered 2023-07-09: 4 mg via INTRAVENOUS

## 2023-07-09 MED ORDER — ONDANSETRON 4 MG PO TBDP: 4.0000 mg | ORAL_TABLET | Freq: Four times a day (QID) | ORAL | Status: DC | PRN

## 2023-07-09 MED ORDER — ORAL CARE MOUTH RINSE: 15.0000 mL | Freq: Once | OROMUCOSAL | Status: AC

## 2023-07-09 MED ORDER — POLYETHYLENE GLYCOL 3350 17 G PO PACK
17.0000 g | PACK | Freq: Every day | ORAL | Status: DC
  Administered 2023-07-10 – 2023-07-11 (×2): 17 g via ORAL
  Filled 2023-07-09 (×2): qty 1

## 2023-07-09 MED ORDER — FENTANYL CITRATE PF 50 MCG/ML IJ SOSY
25.0000 ug | PREFILLED_SYRINGE | INTRAMUSCULAR | Status: DC | PRN
Start: 2023-07-09 — End: 2023-07-09
  Administered 2023-07-09: 50 ug via INTRAVENOUS

## 2023-07-09 MED ORDER — SUGAMMADEX SODIUM 200 MG/2ML IV SOLN
INTRAVENOUS | Status: DC | PRN
  Administered 2023-07-09: 200 mg via INTRAVENOUS

## 2023-07-09 MED ORDER — POTASSIUM CHLORIDE 2 MEQ/ML IV SOLN
INTRAVENOUS | Status: DC
  Filled 2023-07-09 (×2): qty 1000

## 2023-07-09 MED ORDER — CHLORHEXIDINE GLUCONATE 0.12 % MT SOLN
15.0000 mL | Freq: Once | OROMUCOSAL | Status: AC
Start: 2023-07-09 — End: 2023-07-09
  Administered 2023-07-09: 15 mL via OROMUCOSAL

## 2023-07-09 MED ORDER — PHENYLEPHRINE HCL-NACL 20-0.9 MG/250ML-% IV SOLN
INTRAVENOUS | Status: DC | PRN
  Administered 2023-07-09: 50 ug/min via INTRAVENOUS

## 2023-07-09 MED ORDER — AZELASTINE HCL 0.1 % NA SOLN: 1.0000 | Freq: Two times a day (BID) | NASAL | Status: DC | PRN

## 2023-07-09 MED ORDER — DEXAMETHASONE SODIUM PHOSPHATE 10 MG/ML IJ SOLN
INTRAMUSCULAR | Status: AC
  Filled 2023-07-09: qty 1

## 2023-07-09 MED ORDER — KETAMINE HCL 10 MG/ML IJ SOLN
INTRAMUSCULAR | Status: DC | PRN
  Administered 2023-07-09: 50 mg via INTRAVENOUS

## 2023-07-09 MED ORDER — FENTANYL CITRATE (PF) 100 MCG/2ML IJ SOLN
INTRAMUSCULAR | Status: AC
  Filled 2023-07-09: qty 2

## 2023-07-09 MED ORDER — AZELASTINE HCL 137 MCG/SPRAY NA SOLN: 1.0000 | Freq: Every day | NASAL | Status: DC | PRN

## 2023-07-09 MED ORDER — BISACODYL 10 MG RE SUPP: 10.0000 mg | Freq: Every day | RECTAL | Status: DC | PRN

## 2023-07-09 MED ORDER — ONDANSETRON HCL 4 MG/2ML IJ SOLN: 4.0000 mg | Freq: Once | INTRAMUSCULAR | Status: DC | PRN

## 2023-07-09 MED ORDER — BUPIVACAINE LIPOSOME 1.3 % IJ SUSP: 20.0000 mL | Freq: Once | INTRAMUSCULAR | Status: DC

## 2023-07-09 MED ORDER — GABAPENTIN 300 MG PO CAPS
300.0000 mg | ORAL_CAPSULE | ORAL | Status: AC
  Administered 2023-07-09: 300 mg via ORAL
  Filled 2023-07-09: qty 1

## 2023-07-09 MED ORDER — DOCUSATE SODIUM 100 MG PO CAPS
100.0000 mg | ORAL_CAPSULE | Freq: Two times a day (BID) | ORAL | Status: DC
  Administered 2023-07-09 – 2023-07-11 (×4): 100 mg via ORAL
  Filled 2023-07-09 (×4): qty 1

## 2023-07-09 MED ORDER — FENTANYL CITRATE PF 50 MCG/ML IJ SOSY
PREFILLED_SYRINGE | INTRAMUSCULAR | Status: AC
  Filled 2023-07-09: qty 1

## 2023-07-09 MED ORDER — EPHEDRINE SULFATE (PRESSORS) 50 MG/ML IJ SOLN
INTRAMUSCULAR | Status: DC | PRN
  Administered 2023-07-09: 5 mg via INTRAVENOUS

## 2023-07-09 MED ORDER — RIOCIGUAT 2 MG PO TABS
2.0000 mg | ORAL_TABLET | Freq: Three times a day (TID) | ORAL | Status: DC
  Administered 2023-07-09 – 2023-07-11 (×4): 2 mg via ORAL
  Filled 2023-07-09 (×6): qty 1

## 2023-07-09 MED ORDER — ATORVASTATIN CALCIUM 20 MG PO TABS
20.0000 mg | ORAL_TABLET | Freq: Every day | ORAL | Status: DC
  Administered 2023-07-09 – 2023-07-10 (×2): 20 mg via ORAL
  Filled 2023-07-09 (×2): qty 1

## 2023-07-09 MED ORDER — MONTELUKAST SODIUM 10 MG PO TABS
10.0000 mg | ORAL_TABLET | Freq: Every day | ORAL | Status: DC
  Administered 2023-07-10 – 2023-07-11 (×2): 10 mg via ORAL
  Filled 2023-07-09 (×2): qty 1

## 2023-07-09 MED ORDER — BUPIVACAINE LIPOSOME 1.3 % IJ SUSP
INTRAMUSCULAR | Status: DC | PRN
Start: 2023-07-09 — End: 2023-07-09
  Administered 2023-07-09: 20 mL

## 2023-07-09 MED ORDER — ONDANSETRON HCL 4 MG/2ML IJ SOLN
INTRAMUSCULAR | Status: AC
  Filled 2023-07-09: qty 2

## 2023-07-09 MED ORDER — BUPIVACAINE LIPOSOME 1.3 % IJ SUSP
INTRAMUSCULAR | Status: AC
  Filled 2023-07-09: qty 20

## 2023-07-09 MED ORDER — LIDOCAINE HCL (CARDIAC) PF 100 MG/5ML IV SOSY
PREFILLED_SYRINGE | INTRAVENOUS | Status: DC | PRN
  Administered 2023-07-09: 100 mg via INTRAVENOUS

## 2023-07-09 MED ORDER — SUCCINYLCHOLINE CHLORIDE 200 MG/10ML IV SOSY
PREFILLED_SYRINGE | INTRAVENOUS | Status: AC
  Filled 2023-07-09: qty 10

## 2023-07-09 MED ORDER — CHLORHEXIDINE GLUCONATE CLOTH 2 % EX PADS: 6.0000 | MEDICATED_PAD | Freq: Once | CUTANEOUS | Status: DC

## 2023-07-09 MED ORDER — SPIRONOLACTONE 12.5 MG HALF TABLET
12.5000 mg | ORAL_TABLET | Freq: Every day | ORAL | Status: DC
  Administered 2023-07-10: 12.5 mg via ORAL
  Filled 2023-07-09 (×2): qty 1

## 2023-07-09 MED ORDER — KETAMINE HCL 50 MG/5ML IJ SOSY
PREFILLED_SYRINGE | INTRAMUSCULAR | Status: AC
  Filled 2023-07-09: qty 5

## 2023-07-09 MED ORDER — ACETAMINOPHEN 500 MG PO TABS
500.0000 mg | ORAL_TABLET | Freq: Four times a day (QID) | ORAL | Status: DC
  Administered 2023-07-09 – 2023-07-10 (×5): 500 mg via ORAL
  Filled 2023-07-09 (×5): qty 1

## 2023-07-09 MED ORDER — NAPHAZOLINE-GLYCERIN 0.012-0.25 % OP SOLN: 1.0000 [drp] | Freq: Four times a day (QID) | OPHTHALMIC | Status: DC | PRN

## 2023-07-09 MED ORDER — MAGIC MOUTHWASH: 15.0000 mL | Freq: Four times a day (QID) | ORAL | Status: DC | PRN

## 2023-07-09 MED ORDER — PHENOL 1.4 % MT LIQD: 2.0000 | OROMUCOSAL | Status: DC | PRN

## 2023-07-09 MED ORDER — ACETAMINOPHEN 500 MG PO TABS
1000.0000 mg | ORAL_TABLET | ORAL | Status: AC
  Administered 2023-07-09: 1000 mg via ORAL
  Filled 2023-07-09: qty 2

## 2023-07-09 MED ORDER — BUPIVACAINE-EPINEPHRINE 0.25% -1:200000 IJ SOLN
INTRAMUSCULAR | Status: AC
  Filled 2023-07-09: qty 1

## 2023-07-09 MED ORDER — ENOXAPARIN SODIUM 40 MG/0.4ML IJ SOSY
40.0000 mg | PREFILLED_SYRINGE | INTRAMUSCULAR | Status: DC
  Administered 2023-07-10 – 2023-07-11 (×2): 40 mg via SUBCUTANEOUS
  Filled 2023-07-09 (×2): qty 0.4

## 2023-07-09 MED ORDER — GABAPENTIN 100 MG PO CAPS
100.0000 mg | ORAL_CAPSULE | Freq: Three times a day (TID) | ORAL | 1 refills | Status: DC
  Filled 2023-07-09: qty 42, 14d supply, fill #0

## 2023-07-09 MED ORDER — CEFAZOLIN IN SODIUM CHLORIDE 3-0.9 GM/100ML-% IV SOLN
3.0000 g | INTRAVENOUS | Status: AC
  Administered 2023-07-09: 3 g via INTRAVENOUS
  Filled 2023-07-09: qty 100

## 2023-07-09 MED ORDER — LIDOCAINE HCL (PF) 2 % IJ SOLN
INTRAMUSCULAR | Status: DC | PRN
  Administered 2023-07-09: 1.5 mg/kg/h via INTRADERMAL

## 2023-07-09 MED ORDER — HYDROCODONE-ACETAMINOPHEN 10-325 MG PO TABS
1.0000 | ORAL_TABLET | Freq: Three times a day (TID) | ORAL | Status: DC | PRN
  Administered 2023-07-09 – 2023-07-10 (×2): 1 via ORAL
  Filled 2023-07-09 (×3): qty 1

## 2023-07-09 MED ORDER — ALBUTEROL SULFATE HFA 108 (90 BASE) MCG/ACT IN AERS: 2.0000 | INHALATION_SPRAY | Freq: Four times a day (QID) | RESPIRATORY_TRACT | Status: DC | PRN

## 2023-07-09 MED ORDER — DIPHENHYDRAMINE HCL 12.5 MG/5ML PO ELIX: 12.5000 mg | ORAL_SOLUTION | Freq: Four times a day (QID) | ORAL | Status: DC | PRN

## 2023-07-09 MED ORDER — ENSURE PRE-SURGERY PO LIQD
592.0000 mL | Freq: Once | ORAL | Status: DC
  Filled 2023-07-09: qty 592

## 2023-07-09 MED ORDER — FENTANYL CITRATE (PF) 100 MCG/2ML IJ SOLN
INTRAMUSCULAR | Status: DC | PRN
  Administered 2023-07-09: 100 ug via INTRAVENOUS
  Administered 2023-07-09 (×2): 50 ug via INTRAVENOUS

## 2023-07-09 MED ORDER — CYCLOBENZAPRINE HCL 10 MG PO TABS: 10.0000 mg | ORAL_TABLET | Freq: Two times a day (BID) | ORAL | Status: DC | PRN

## 2023-07-09 MED ORDER — ENSURE PRE-SURGERY PO LIQD
296.0000 mL | Freq: Once | ORAL | Status: DC
  Filled 2023-07-09: qty 296

## 2023-07-09 MED ORDER — CHLORHEXIDINE GLUCONATE CLOTH 2 % EX PADS
6.0000 | MEDICATED_PAD | Freq: Every day | CUTANEOUS | Status: DC
  Administered 2023-07-09 – 2023-07-11 (×3): 6 via TOPICAL

## 2023-07-09 MED ORDER — HYDROMORPHONE HCL 1 MG/ML IJ SOLN
0.5000 mg | INTRAMUSCULAR | Status: DC | PRN
  Administered 2023-07-09: 1 mg via INTRAVENOUS
  Administered 2023-07-09: 0.5 mg via INTRAVENOUS
  Administered 2023-07-10 (×4): 1 mg via INTRAVENOUS
  Filled 2023-07-09 (×6): qty 1

## 2023-07-09 MED ORDER — LIDOCAINE HCL 2 % IJ SOLN
INTRAMUSCULAR | Status: AC
  Filled 2023-07-09: qty 20

## 2023-07-09 MED ORDER — BUPIVACAINE-EPINEPHRINE 0.25% -1:200000 IJ SOLN
INTRAMUSCULAR | Status: DC | PRN
  Administered 2023-07-09: 50 mL

## 2023-07-09 MED ORDER — BABY OIL EX OIL: 1.0000 [drp] | TOPICAL_OIL | CUTANEOUS | Status: DC | PRN

## 2023-07-09 MED ORDER — TORSEMIDE 20 MG PO TABS
20.0000 mg | ORAL_TABLET | Freq: Every day | ORAL | Status: DC
  Administered 2023-07-10: 20 mg via ORAL
  Filled 2023-07-09 (×2): qty 1

## 2023-07-09 MED ORDER — DEXAMETHASONE SODIUM PHOSPHATE 10 MG/ML IJ SOLN
INTRAMUSCULAR | Status: DC | PRN
  Administered 2023-07-09: 10 mg via INTRAVENOUS

## 2023-07-09 MED ORDER — ROCURONIUM BROMIDE 10 MG/ML (PF) SYRINGE
PREFILLED_SYRINGE | INTRAVENOUS | Status: DC | PRN
  Administered 2023-07-09: 140 mg via INTRAVENOUS

## 2023-07-09 MED ORDER — PHENYLEPHRINE HCL (PRESSORS) 10 MG/ML IV SOLN
INTRAVENOUS | Status: AC
Start: 2023-07-09 — End: ?
  Filled 2023-07-09: qty 1

## 2023-07-09 MED ORDER — LORATADINE 10 MG PO TABS: 10.0000 mg | ORAL_TABLET | Freq: Every day | ORAL | Status: DC | PRN

## 2023-07-09 MED ORDER — DIPHENHYDRAMINE HCL 50 MG/ML IJ SOLN: 12.5000 mg | Freq: Four times a day (QID) | INTRAMUSCULAR | Status: DC | PRN

## 2023-07-09 MED ORDER — POLYETHYLENE GLYCOL 3350 17 G PO PACK: 17.0000 g | PACK | Freq: Two times a day (BID) | ORAL | Status: DC | PRN

## 2023-07-09 MED ORDER — ALUM & MAG HYDROXIDE-SIMETH 200-200-20 MG/5ML PO SUSP: 30.0000 mL | Freq: Four times a day (QID) | ORAL | Status: DC | PRN

## 2023-07-09 MED ORDER — ROCURONIUM BROMIDE 10 MG/ML (PF) SYRINGE
PREFILLED_SYRINGE | INTRAVENOUS | Status: AC
  Filled 2023-07-09: qty 10

## 2023-07-09 MED ORDER — SIMETHICONE 80 MG PO CHEW: 40.0000 mg | CHEWABLE_TABLET | Freq: Four times a day (QID) | ORAL | Status: DC | PRN

## 2023-07-09 MED ORDER — SODIUM CHLORIDE 0.9 % IV SOLN: INTRAVENOUS | Status: DC | PRN

## 2023-07-09 MED ORDER — LACTATED RINGERS IV SOLN: Freq: Three times a day (TID) | INTRAVENOUS | Status: AC | PRN

## 2023-07-09 MED ORDER — LACTATED RINGERS IV SOLN: INTRAVENOUS | Status: DC

## 2023-07-09 MED ORDER — QUETIAPINE FUMARATE 50 MG PO TABS
300.0000 mg | ORAL_TABLET | Freq: Every evening | ORAL | Status: DC | PRN
Start: 2023-07-09 — End: 2023-07-11

## 2023-07-09 MED ORDER — METOPROLOL TARTRATE 5 MG/5ML IV SOLN: 5.0000 mg | Freq: Four times a day (QID) | INTRAVENOUS | Status: DC | PRN

## 2023-07-09 MED ORDER — POTASSIUM CHLORIDE ER 10 MEQ PO TBCR
20.0000 meq | EXTENDED_RELEASE_TABLET | Freq: Every day | ORAL | Status: DC
  Administered 2023-07-10 – 2023-07-11 (×2): 20 meq via ORAL
  Filled 2023-07-09 (×4): qty 2

## 2023-07-09 MED ORDER — PROPOFOL 10 MG/ML IV BOLUS
INTRAVENOUS | Status: DC | PRN
  Administered 2023-07-09: 200 mg via INTRAVENOUS

## 2023-07-09 MED ORDER — PROCHLORPERAZINE MALEATE 10 MG PO TABS: 10.0000 mg | ORAL_TABLET | Freq: Four times a day (QID) | ORAL | Status: DC | PRN

## 2023-07-09 MED ORDER — 0.9 % SODIUM CHLORIDE (POUR BTL) OPTIME
TOPICAL | Status: DC | PRN
Start: 2023-07-09 — End: 2023-07-09
  Administered 2023-07-09: 1000 mL

## 2023-07-09 MED ORDER — SALINE SPRAY 0.65 % NA SOLN: 1.0000 | Freq: Four times a day (QID) | NASAL | Status: DC | PRN

## 2023-07-09 MED ORDER — HYDROCODONE-ACETAMINOPHEN 10-325 MG PO TABS
1.0000 | ORAL_TABLET | Freq: Four times a day (QID) | ORAL | 0 refills | Status: DC | PRN
  Filled 2023-07-09 – 2023-07-16 (×4): qty 30, 8d supply, fill #0

## 2023-07-09 MED ORDER — AMISULPRIDE (ANTIEMETIC) 5 MG/2ML IV SOLN: 10.0000 mg | Freq: Once | INTRAVENOUS | Status: DC | PRN

## 2023-07-09 MED ORDER — MENTHOL 3 MG MT LOZG
1.0000 | LOZENGE | OROMUCOSAL | Status: DC | PRN
  Administered 2023-07-09: 3 mg via ORAL
  Filled 2023-07-09: qty 9

## 2023-07-09 MED ORDER — LIDOCAINE HCL (PF) 2 % IJ SOLN
INTRAMUSCULAR | Status: AC
  Filled 2023-07-09: qty 5

## 2023-07-09 MED ORDER — PANTOPRAZOLE SODIUM 40 MG PO TBEC
80.0000 mg | DELAYED_RELEASE_TABLET | Freq: Every day | ORAL | Status: DC
  Administered 2023-07-10 – 2023-07-11 (×2): 80 mg via ORAL
  Filled 2023-07-09 (×2): qty 2

## 2023-07-09 MED ORDER — ROCURONIUM BROMIDE 100 MG/10ML IV SOLN
INTRAVENOUS | Status: DC | PRN
  Administered 2023-07-09: 10 mg via INTRAVENOUS
  Administered 2023-07-09: 60 mg via INTRAVENOUS
  Administered 2023-07-09 (×2): 20 mg via INTRAVENOUS

## 2023-07-09 MED ORDER — SEMAGLUTIDE (2 MG/DOSE) 8 MG/3ML ~~LOC~~ SOPN: 2.0000 mg | PEN_INJECTOR | SUBCUTANEOUS | Status: DC

## 2023-07-09 MED ORDER — GABAPENTIN 100 MG PO CAPS
200.0000 mg | ORAL_CAPSULE | Freq: Three times a day (TID) | ORAL | Status: DC
  Administered 2023-07-09 – 2023-07-10 (×3): 200 mg via ORAL
  Filled 2023-07-09 (×3): qty 2

## 2023-07-09 MED ORDER — ONDANSETRON HCL 4 MG/2ML IJ SOLN
4.0000 mg | Freq: Four times a day (QID) | INTRAMUSCULAR | Status: DC | PRN
  Administered 2023-07-09 – 2023-07-10 (×3): 4 mg via INTRAVENOUS
  Filled 2023-07-09 (×3): qty 2

## 2023-07-09 MED ORDER — SODIUM CHLORIDE 0.9% FLUSH: 10.0000 mL | INTRAVENOUS | Status: DC | PRN

## 2023-07-09 SURGICAL SUPPLY — 56 items
APL PRP STRL LF DISP 70% ISPRP (MISCELLANEOUS) ×1
BAG COUNTER SPONGE SURGICOUNT (BAG) IMPLANT
BAG SPNG CNTER NS LX DISP (BAG)
BINDER ABDOMINAL 12 ML 46-62 (SOFTGOODS) IMPLANT
CABLE HIGH FREQUENCY MONO STRZ (ELECTRODE) ×1 IMPLANT
CHLORAPREP W/TINT 26 (MISCELLANEOUS) ×1 IMPLANT
CLSR STERI-STRIP ANTIMIC 1/2X4 (GAUZE/BANDAGES/DRESSINGS) IMPLANT
COVER SURGICAL LIGHT HANDLE (MISCELLANEOUS) ×1 IMPLANT
DEVICE SECURE STRAP 25 ABSORB (INSTRUMENTS) IMPLANT
DEVICE TROCAR PUNCTURE CLOSURE (ENDOMECHANICALS) IMPLANT
DRAPE WARM FLUID 44X44 (DRAPES) ×1 IMPLANT
DRSG TEGADERM 2-3/8X2-3/4 SM (GAUZE/BANDAGES/DRESSINGS) ×2 IMPLANT
DRSG TEGADERM 4X4.75 (GAUZE/BANDAGES/DRESSINGS) ×1 IMPLANT
ELECT REM PT RETURN 15FT ADLT (MISCELLANEOUS) ×1 IMPLANT
GAUZE SPONGE 2X2 8PLY STRL LF (GAUZE/BANDAGES/DRESSINGS) ×1 IMPLANT
GLOVE ECLIPSE 8.0 STRL XLNG CF (GLOVE) ×1 IMPLANT
GLOVE INDICATOR 8.0 STRL GRN (GLOVE) ×1 IMPLANT
GOWN STRL REUS W/ TWL XL LVL3 (GOWN DISPOSABLE) ×3 IMPLANT
GOWN STRL REUS W/TWL XL LVL3 (GOWN DISPOSABLE) ×3
IRRIG SUCT STRYKERFLOW 2 WTIP (MISCELLANEOUS)
IRRIGATION SUCT STRKRFLW 2 WTP (MISCELLANEOUS) IMPLANT
KIT BASIN OR (CUSTOM PROCEDURE TRAY) ×1 IMPLANT
KIT TURNOVER KIT A (KITS) IMPLANT
MARKER SKIN DUAL TIP RULER LAB (MISCELLANEOUS) ×1 IMPLANT
MESH HERNIA 6X6 BARD (Mesh General) IMPLANT
MESH VENTRALIGHT ST 6X8 (Mesh Specialty) ×1 IMPLANT
MESH VENTRLGHT ELLIPSE 8X6XMFL (Mesh Specialty) IMPLANT
NDL INSUFFLATION 14GA 120MM (NEEDLE) IMPLANT
NDL SPNL 22GX3.5 QUINCKE BK (NEEDLE) IMPLANT
NEEDLE INSUFFLATION 14GA 120MM (NEEDLE)
NEEDLE SPNL 22GX3.5 QUINCKE BK (NEEDLE) ×1
PAD POSITIONING PINK XL (MISCELLANEOUS) ×1 IMPLANT
SCISSORS LAP 5X35 DISP (ENDOMECHANICALS) ×1 IMPLANT
SET TUBE SMOKE EVAC HIGH FLOW (TUBING) ×1 IMPLANT
SLEEVE ADV FIXATION 12X100MM (TROCAR) IMPLANT
SLEEVE ADV FIXATION 5X100MM (TROCAR) ×2 IMPLANT
SPIKE FLUID TRANSFER (MISCELLANEOUS) ×1 IMPLANT
STRIP CLOSURE SKIN 1/2X4 (GAUZE/BANDAGES/DRESSINGS) IMPLANT
SUT MNCRL AB 4-0 PS2 18 (SUTURE) ×1 IMPLANT
SUT PDS AB 1 CT1 27 (SUTURE) ×2 IMPLANT
SUT STRATAFIX SPIRAL 3-0 PDS+ (SUTURE) ×2
SUT VIC AB 2-0 SH 27 (SUTURE)
SUT VIC AB 2-0 SH 27X BRD (SUTURE) IMPLANT
SUT VICRYL 0 UR6 27IN ABS (SUTURE) ×1 IMPLANT
SUT VLOC 3-0 9IN GRN (SUTURE) IMPLANT
SUT VLOC BARB 180 ABS3/0GR12 (SUTURE)
SUTURE STRATFX SPIRAL 3-0 PDS+ (SUTURE) IMPLANT
SUTURE VLOC BRB 180 ABS3/0GR12 (SUTURE) IMPLANT
TOWEL OR 17X26 10 PK STRL BLUE (TOWEL DISPOSABLE) ×1 IMPLANT
TOWEL OR NON WOVEN STRL DISP B (DISPOSABLE) ×1 IMPLANT
TRAY FOLEY MTR SLVR 16FR STAT (SET/KITS/TRAYS/PACK) IMPLANT
TRAY LAPAROSCOPIC (CUSTOM PROCEDURE TRAY) ×1 IMPLANT
TROCAR 5M 150ML BLDLS (TROCAR) IMPLANT
TROCAR ADV FIXATION 12X100MM (TROCAR) ×1 IMPLANT
TROCAR ADV FIXATION 5X100MM (TROCAR) ×1 IMPLANT
TROCAR BALLN 12MMX100 BLUNT (TROCAR) IMPLANT

## 2023-07-09 NOTE — H&P (Signed)
07/09/2023   REFERRING PHYSICIAN: Room, Emergency  Patient Care Team: [name] Glatter, MD as PCP - General (Internal Medicine) [name] Dick, NP as Consulting Provider (Cardiovascular Disease) [name] [name], [name] Route, MD as Consulting Provider (General Surgery) Bensimhon, Neill Loft, MD (Cardiovascular Disease) Chilton Greathouse, MD (Pulmonary Disease) Terri Piedra, MD (Internal Medicine)  PROVIDER: Jarrett Soho, MD  [facility] MRN: [id number] DOB: [birth year]  SUBJECTIVE   Chief Complaint: New Consultation (Eval of Abd Wall hernias)   [name] [name] is a 62 y.o. female  who is seen today as an office consultation  at the request of [name] [name], ED  for evaluation of abdominal pain with hernias  History of Present Illness:  62 year old woman with numerous medical problems. Chronic right-sided heart failure followed by [name] [name]. 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 Sleep apnea on nightly oxygen. Diabetes. She has had abdominal pain issues as well. Followed by digestive diseases through [facility]. Primarily the [place]. Sounds like she has IBS constipation component and has been on stool softeners and intermittent Linzess. More recently has been taking MiraLAX every day and that seems to keep her bowels moving about every other day to twice a day. Linzess for breakthrough. She has had intermittent abdominal pains around the bellybutton in the left lower side more than the right lower side. Became worse. Concerning. Went to emergency department. CAT scan done revealing fat-containing periumbilical hernia and left inguinal hernia. Surgical consultation offered.  She comes today with her husband. She walks with a cane. She can walk about a block or 2 before she has to stop feeling short of breath. She has had a laparoscopic cholecystectomy and hysterectomy. She also has had some occasional hemorrhoid flares. No major bleeding. She has had a lot of  chronic nausea lately. Not listing related to eating or certain foods. She is already had her gallbladder out. She does have some heartburn and reflux for which she is on a proton pump inhibitor and followed by GI.  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) 03/14/2016  PFT (02-2016): NORMAL SPIROMETRY, NORMAL LUNG VOLUMES, MODERATELY REDUCED DIFFUSION CT Angio (02-2016): Occlusive thromus in RLL artery, sparing the RLL superior segment, atelectasis Echocardiogram (12-2015): normal LV, nomral LVEF 55-60%, normal LA, mornal RV size, RVSP estimated as 70 mm Hg  Depression  Difficult airway 03/29/2016  DOE (dyspnea on exertion)  DVT (deep venous thrombosis) (CMS/HHS-HCC)  GERD (gastroesophageal reflux disease)  Hot flashes  Hypertension  Obesity  Pulmonary embolism (CMS/HHS-HCC)  Rheumatoid arthritis (CMS/HHS-HCC)  Sleep apnea  Unspecified hereditary and idiopathic peripheral neuropathy   Patient Active Problem List  Diagnosis  Hypertension  DOE (dyspnea on exertion)  Abnormal EKG  Chronic pain syndrome  Major depressive disorder, single episode  Obstructive sleep apnea  Other pulmonary embolism without acute cor pulmonale (CMS/HHS-HCC)  Dizziness and giddiness  Morbid obesity with BMI of 50.0-59.9, adult (CMS/HHS-HCC)  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 Sandi [name] in [place]  ENDARTERECTOMY PULMONARY ARTERY N/A 03/29/2016  Procedure: PULMONARY ENDARTERECTOMY, WITH OR WITHOUT EMBOLECTOMY, WITH CARDIOPULMONARY BYPASS; Surgeon: [name] Peer, MD; Location: [facility] OPERATING ROOMS; Service: Cardiothoracic; Laterality: N/A;  arthroscopy Bilateral  knees  ARTHROSCOPY ANKLE  CHOLECYSTECTOMY  ESSURE TUBAL LIGATION  HEMORRHOIDECTOMY EXTERNAL  HYSTERECTOMY VAGINAL  ORIF ANKLE FRACTURE Right  hardware removed 6 months later  REDUCTION MAMMAPLASTY    No Known Allergies  Current Outpatient Medications on File Prior to Visit  Medication Sig Dispense Refill  acetaminophen (TYLENOL) 500 mg capsule Take 500 mg by mouth every 4 (four) hours as needed for Fever.  albuterol 90 mcg/actuation inhaler Inhale 2 inhalations into the lungs every 6 (six) hours as needed.   atorvastatin (LIPITOR) 20 MG tablet TAKE 1 TABLET BY MOUTH ONCE DAILY Oral for 90 Days  budesonide-formoterol (SYMBICORT) 160-4.5 mcg/actuation inhaler Inhale into the lungs 2 (two) times daily as needed.  cetirizine (ZYRTEC) 10 MG tablet Take 10 mg by mouth once daily  citalopram (CELEXA) 40 MG tablet Take 40 mg by mouth once daily.  ferrous sulfate 324 mg (65 mg iron) EC tablet Take 1 tablet (324 mg total) by mouth daily with breakfast. 30 each 0  HYDROcodone-acetaminophen (NORCO) 5-325 mg tablet Take 1 tablet by mouth 3 (three) times daily as needed  LINZESS 145 mcg capsule Take 145 mcg by mouth every morning  melatonin 3 mg tablet Take 3 mg by mouth 2 (two) times daily as needed.  omeprazole (PRILOSEC) 40 MG DR capsule Take 40 mg by mouth 2 (two) times daily before meals  polyethylene glycol (MIRALAX) packet Take by mouth as needed.  rivaroxaban (XARELTO) 20 mg tablet Take 20 mg by mouth once daily.  spironolactone (ALDACTONE) 25 MG tablet Take 12.5 mg by mouth once daily   No current facility-administered  medications on file prior to visit.   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  Vaping Use  Vaping status: Never Used  Substance and Sexual Activity  Alcohol use: No  Drug use: No  Sexual activity: Yes  Partners: Male  Comment: manogamous with husband  Social History Narrative  On disability since ~2016.   Social Determinants of Health   Financial Resource Strain: High Risk (10/25/2022)  Received from [facility]  Overall Financial Resource Strain (CARDIA)  Difficulty of Paying Living Expenses: Hard   ############################################################  Review of Systems: A complete review of systems (ROS) was obtained from the patient.  We have reviewed this information and discussed as appropriate with the patient.  See HPI as well for other pertinent ROS.  Constitutional: No fevers, chills, sweats. Weight stable Eyes: No vision changes, No discharge HENT: No sore throats, nasal drainage  Lymph: No neck swelling, No bruising easily Pulmonary: No cough, productive sputum CV: No orthopnea, PND . No exertional chest/neck/shoulder/arm pain. Patient can walk 1 block gradually.   GI: No personal nor family history of GI/colon cancer, inflammatory  bowel disease, irritable bowel syndrome, allergy such as Celiac Sprue, dietary/dairy problems, colitis, ulcers nor gastritis. No recent sick contacts/gastroenteritis. No travel outside the country. No changes in diet.  Renal: No UTIs, No hematuria Genital: No drainage, bleeding, masses Musculoskeletal: No severe joint pain. Fair ROM major joints Skin: No sores or lesions Heme/Lymph: No easy bleeding. No swollen lymph nodes Neuro: No active seizures. No facial droop Psych: No hallucinations. No agitation  OBJECTIVE   Vitals:  04/22/23 1100  BP: 136/77  Pulse: 83  Temp: 36.9 C (98.4 F)  SpO2: 98%  Weight: (!) 140.7 kg (310 lb 3.2 oz)  Height: 160 cm (5\' 3" )  PainSc: 7   Body mass index is 54.95 kg/m.  PHYSICAL EXAM:  Constitutional: Not cachectic. Hygeine adequate. Vitals signs as above.  Eyes: Wears glasses - vision corrected,Pupils reactive, normal extraocular movements. Sclera nonicteric Neuro: CN II-XII intact. No major focal sensory defects. No major motor deficits. Lymph: No head/neck/groin lymphadenopathy Psych: No severe agitation. No severe anxiety. Judgment & insight Adequate, Oriented x4, HENT: Normocephalic, Mucus membranes moist. No thrush. Hearing: adequate Neck: Supple, No tracheal deviation. No obvious thyromegaly Chest: No pain to chest wall compression. Good respiratory excursion. No audible wheezing CV: Pulses intact. regular. No major extremity edema Ext: No obvious deformity or contracture. Edema: Present at: BLE 1+ mild . No cyanosis Skin: No major subcutaneous nodules. Warm and dry Musculoskeletal: Severe joint rigidity not present. No obvious clubbing. No digital petechiae. Mobility: uses cane moves with minimal assistance  Abdomen: Obese with panniculus Soft. Nondistended. Tenderness at umbilicus only . Hernia: Present at: Umbilicus, size 3x3cm. Diastasis recti: Mild supraumbilical midline. No hepatomegaly. No splenomegaly.  Genital/Pelvic: Inguinal  hernia: Present at: Left groin with sensitivity. Right groin pain with cough as well . Inguinal lymph nodes: without lymphadenopathy nor hidradenitis.   Rectal: (Deferred)    ###################################################################  Labs, Imaging and Diagnostic Testing:  Located in 'Care Everywhere' section of Epic EMR chart  PRIOR [facility] NOTES:  Not applicable  SURGERY NOTES:  Not applicable  PATHOLOGY:  Not applicable  Assessment and Plan:  DIAGNOSES:  Diagnoses and all orders for this visit:  Incisional hernia, without obstruction or gangrene  Left inguinal hernia  Morbid obesity with BMI of 50.0-59.9, adult (CMS/HHS-HCC)  Chronic anticoagulation    ASSESSMENT/PLAN  Pleasant superobese woman with small periumbilical incisional and left inguinal hernias that are painful and symptomatic.  Standard care is to consider repair. Usually do a laparoscopic approach. Usually I would do a TEP approach. Can start with that but given her obesity, may need to do a TAPP laparoscopic approach. Start fixing the hernias in the groins and then periumbilically. Usually an outpatient surgery but given her super obesity and comorbidities, may need to be watched overnight. We will see.   The anatomy & physiology of the abdominal wall was discussed. The pathophysiology of hernias was discussed. Natural history risks without surgery including progeressive enlargement, pain, incarceration, & strangulation was discussed. Contributors to complications such as smoking, obesity, diabetes, prior surgery, etc were discussed.   I feel the risks of no intervention will lead to serious problems that outweigh the operative risks; therefore, I recommended surgery to reduce and repair the hernia. I explained laparoscopic techniques with possible need for  an open approach. I noted the probable use of mesh to patch and/or buttress the hernia repair  Risks such as bleeding, infection,  abscess, need for further treatment, injury to other organs, need for repair of tissues / organs, stroke, heart attack, death, and other risks were discussed. I noted a good likelihood this will help address the problem. Goals of post-operative recovery were discussed as well. Possibility that this will not correct all symptoms was explained. I stressed the importance of low-impact activity, aggressive pain control, avoiding constipation, & not pushing through pain to minimize risk of post-operative chronic pain or injury. Possibility of reherniation especially with smoking, obesity, diabetes, immunosuppression, and other health conditions was discussed. We will work to minimize complications.   An educational handout further explaining the pathology & treatment options was given as well. Questions were answered. The patient expresses understanding & wishes to proceed with surgery.  She does not have the best performance status but no longer smokes and is relatively active. Followed by cardiology. Cleared.

## 2023-07-09 NOTE — Op Note (Signed)
07/09/2023  PATIENT:  [name] [name]  62 y.o. female  Patient Care Team: Hillery Aldo, NP as PCP - Erline Hau, MD as Consulting Physician (General Surgery) Bensimhon, Bevelyn Buckles, MD as Consulting Physician (Cardiology) Chilton Greathouse, MD as Consulting Physician (Pulmonary Disease) Shirleen Schirmer, PA-C as Physician Assistant (Gastroenterology)  PRE-OPERATIVE DIAGNOSIS:   VENTRAL WALL INCARCERATED INCISIONAL HERNIA LEFT INGUINAL HERNIA POSSIBLE RIGHT INGUINAL HERNIA  POST-OPERATIVE DIAGNOSIS:  VENTRAL WALL INCARCERATED INCISIONAL HERNIA BILATERAL INGUINAL HERNIAS POSSIBLE RIGHT INGUINAL HERNIA  PROCEDURE:   LAPAROSCOPIC REPAIR OF  INCISIONAL INCARCERATED ABDOMINAL WALL HERNIA WITH MESH LAPAROSCOPIC REPAIR OF BILATERAL INGUINAL HERNIAS WITH MESH TAP BLOCK - BILATERAL  SURGEON:  Ardeth Sportsman, MD  ASSISTANT:  (n/a)   ANESTHESIA:  General endotracheal intubation anesthesia (GETA) and Regional TRANSVERSUS ABDOMINIS PLANE (TAP) nerve block -BILATERAL for perioperative & postoperative pain control at the level of the transverse abdominis & preperitoneal spaces along the flank at the anterior axillary line, from subcostal ridge to iliac crest under laparoscopic guidance provided with liposomal bupivacaine (Experel) 20mL mixed with 50 mL of bupivicaine 0.25% with epinephrine  Estimated Blood Loss (EBL):   Total I/O In: -  Out: 20 [Blood:20].   (See anesthesia record)  Delay start of Pharmacological VTE agent (>24hrs) due to concerns of significant anemia, surgical blood loss, or risk of bleeding?:  no  DRAINS: (None)  SPECIMEN:  (no specimen)  DISPOSITION OF SPECIMEN:  (not applicable)  COUNTS:  Sponge, needle, & instrument counts CORRECT  PLAN OF CARE: Admit for overnight observation  PATIENT DISPOSITION:  PACU - hemodynamically stable.  INDICATION: Pleasant patient has developed a ventral wall abdominal hernia incarcerated with pain.  Left groin pain with  small but definite inguinal hernia.  Some right groin discomfort possible right inguinal hernia by exam and CT scan as well.. Recommendation was made for surgical repair  The anatomy & physiology of the abdominal wall was discussed. The pathophysiology of hernias was discussed. Natural history risks without surgery including progeressive enlargement, pain, incarceration & strangulation was discussed. Contributors to complications such as smoking, obesity, diabetes, prior surgery, etc were discussed.  I feel the risks of no intervention will lead to serious problems that outweigh the operative risks; therefore, I recommended surgery to reduce and repair the hernia. I explained laparoscopic techniques with possible need for an open approach. I noted the probable use of mesh to patch and/or buttress the hernia repair.  Risks such as bleeding, infection, abscess, need for further treatment, heart attack, death, and other risks were discussed. I noted a good likelihood this will help address the problem. Goals of post-operative recovery were discussed as well. Possibility that this will not correct all symptoms was explained. I stressed the importance of low-impact activity, aggressive pain control, avoiding constipation, & not pushing through pain to minimize risk of post-operative chronic pain or injury. Possibility of reherniation especially with smoking, obesity, diabetes, immunosuppression, and other health conditions was discussed. We will work to minimize complications.   An educational handout further explaining the pathology & treatment options was given as well. Questions were answered. The patient expresses understanding & wishes to proceed with surgery.   OR FINDINGS: Swiss cheese periumbilical hernia incarcerated with omentum and falciform ligament 4 x 4 cm region.  Type of ventral wall repair: Laparoscopic underlay repair.  Primary repair of largest hernia  Placement of mesh: Centrally  intraperitoneal with edges tucked into RECTRORECTUS & preperitoneal space Name of mesh: Bard Ventralight dual sided (polypropylene / Seprafilm)  Size of mesh: 20x15cm Orientation: Transverse Mesh overlap:  5-7cm  Left greater than right indirect inguinal hernias.  Some laxity in the left obturator foramina but no true hernia there.  No femoral hernias.    DESCRIPTION:    The patient was identified & brought into the operating room. The patient was positioned supine with arms tucked. SCDs were active during the entire case. The patient underwent general anesthesia without any difficulty.  The abdomen was prepped and draped in a sterile fashion. The patient's bladder was emptied.  A Surgical Timeout confirmed our plan.  I made a transverse incision through the inferior umbilical fold.  I made a small transverse nick through the anterior rectus fascia contralateral to the inguinal hernia side and placed a 0-vicryl stitch through the fascia.  I placed a Hasson trocar into the preperitoneal plane.  Entry was clean.  We induced carbon dioxide insufflation. Camera inspection revealed no injury.  I used a 10mm angled scope to bluntly free the peritoneum off the infraumbilical anterior abdominal wall.  I created enough of a preperitoneal pocket to place 5mm ports into the right & left mid-abdomen into this preperitoneal cavity.  I focused attention on the LEFT pelvis since that was the dominant hernia side.   I used blunt & focused sharp dissection to free the peritoneum off the flank and down to the pubic rim.  I freed the anteriolateral bladder wall off the anteriolateral pelvic wall, sparing midline attachments.   I located a swath of peritoneum going into a hernia fascial defect at the  internal ring consistent with  an indirect inguinal hernia..  I gradually freed the peritoneal hernia sac off safely and reduced it into the preperitoneal space.  I freed the peritoneum off the round ligament.  I freed  peritoneum off the retroperitoneum along the psoas muscle.  Spermatic cord lipoma was dissected away & removed.  I checked & assured hemostasis.   Because of her prior hysterectomy and adhesions to the round ligament I did have to repair the peritoneum with laparoscopic absorbable serrated strata fix suture.  This allowed me to do a good high ligation.  I turned attention on the opposite  RIGHT pelvis.  I did dissection in a similar, mirror-image fashion. The patient had an indirect inguinal hernia.Marland Kitchen   Spermatic cord lipoma was dissected away & removed.    I checked & assured hemostasis.  Again peritoneal repair and high ligation of sac done  I chose sheets of medium-weight polypropylene Bard Marlex 15x15cm, one for each side.  I cut a single sigmoid-shaped slit ~6cm from a corner of each mesh.  I placed the meshes into the preperitoneal space & laid them as overlapping diamonds such that at the inferior points, a 6x6 cm corner flap rested in the true anterolateral pelvis, covering the obturator & femoral foramina.   I allowed the bladder to return to the pubis, this helping tuck the corners of the mesh in the anteriolateral pelvis.  The medial corners overlapped each other across midline cephalad to the pubic rim.   Given the numerous hernias of moderate size, I placed a third 15x15cm mesh in the center as a vertical diamond.  The lateral wings of the mesh overlap across the direct spaces and internal rings where the dominant hernias were.  This provided good coverage and reinforcement of the hernia repairs.  Because of the central mesh placement with good overlap, I did not place any tacks.   We then turned attention  to the ventral wall hernia.  Abdominal entry was gained using optical entry technique in the left upper abdomen. Entry was clean. I induced carbon dioxide insufflation. Camera inspection revealed no injury. Extra ports were carefully placed under direct laparoscopic visualization.   I could  see adhesions on the parietal peritoneum under the abdominal wall.   I did laparoscopic lysis of adhesions to expose the entire anterior abdominal wall.  I primarily used focused sharp dissection.  Eventually reduced a wad of greater omentum up in the hernia.  I freed off the falciform ligament and central peritoneum to expose the retrorectus fascia   freed off the peritoneum and got to the preperitoneal type pocket from the inguinal hernias.  The patient had Swiss cheese type hernias in 3 locations.  I made sure hemostasis was good.  I mapped out the region using a needle passer.   To ensure that I would have at least 7 cm radial coverage outside of the hernia defect given her superobesity, I chose a 20x15cm dual sided mesh.  I placed #1 PDS stitches around its edge about every 5 cm = 8 total.  I rolled the mesh & placed into the peritoneal cavity through the hernia defect.  I unrolled the mesh and positioned it appropriately.  I secured the mesh to cover up the hernia defect using a laparoscopic suture passer to pass the tails of the PDS through the abdominal wall & tagged them with clamps for good transfascial suturing.  I started out in four corners to make sure I had the mesh centered under the hernia defect appropriately, and then proceeded to work in quadrants.  I made sure that the inferior edge of the central mesh overlapped with the superior midline polypropylene preperitoneal mesh  We evacuated CO2 & desufflated the abdomen.  I tied the fascial stitches down. I closed the fascial defect that I placed the mesh through using #1 PDS interrupted transverse stitches primarily.  I reinsufflated the abdomen. The mesh provided at least circumferential coverage around the entire region of hernia defects.  I secured the mesh centrally with an additional trans fascial stitch in & out the mesh using #1 PDS under laparoscopic visualization.  This allowed me to tack the peritoneum cephalad I tacked the edges,  inferior flap of peritoneum & central part of the mesh to the peritoneum/posterior rectus fascia with SecureStrap absorbable tacks.   I did reinspection. Hemostasis was good. Mesh laid well. I completed a broad field block of local anesthesia at fascial stitch sites & fascial closure areas.    Capnoperitoneum was evacuated. Ports were removed. The skin was closed with Monocryl at the port sites and Steri-Strips on the fascial stitch puncture sites.  Patient is being extubated to go to the recovery room.  I discussed operative findings, updated the patient's status, discussed probable steps to recovery, and gave postoperative recommendations to the patient's spouse, Ahjah Raudenbush.  Recommendations were made.  Questions were answered.  He expressed understanding & appreciation.  Ardeth Sportsman, M.D., F.A.C.S. Gastrointestinal and Minimally Invasive Surgery [facility] [address] [place], [place] [address] [phone] [address] / Paging  07/09/2023 11:51 AM

## 2023-07-09 NOTE — Interval H&P Note (Signed)
History and Physical Interval Note:  07/09/2023 7:44 AM  [name] [name]  has presented today for surgery, with the diagnosis of left and possible right inguinal hernia periumbilical incisional hernia.  The various methods of treatment have been discussed with the patient and family. After consideration of risks, benefits and other options for treatment, the patient has consented to  Procedure(s) with comments: LAPAROSCOPIC LEFT AND POSSIBLE RIGHT INGUINAL HERNIA (N/A) - 180 PERIUMBILICAL INCISIONAL HERNIA (N/A) as a surgical intervention.  The patient's history has been reviewed, patient examined, no change in status, stable for surgery.  I have reviewed the patient's chart and labs.  Questions were answered to the patient's satisfaction.    I have re-reviewed the the patient's records, history, medications, and allergies.  I have re-examined the patient.  I again discussed intraoperative plans and goals of post-operative recovery.  The patient agrees to proceed.  [name] [name]  April 04, 1961 [id number]  Patient Care Team: Hillery Aldo, NP as PCP - Erline Hau, MD as Consulting Physician (General Surgery) Bensimhon, Bevelyn Buckles, MD as Consulting Physician (Cardiology) Chilton Greathouse, MD as Consulting Physician (Pulmonary Disease) Shirleen Schirmer, PA-C as Physician Assistant (Gastroenterology)  Patient Active Problem List   Diagnosis Date Noted   GI bleed 02/25/2020   Pyelonephritis    Primary osteoarthritis of right knee 09/30/2019   Achilles tendon contracture, left    Leg pain, left 07/10/2018   Bilateral primary osteoarthritis of knee 05/12/2018   Plantar fasciitis of left foot 05/12/2018   Shortness of breath 01/16/2018   Palliative care encounter 01/16/2018   Major depressive disorder, 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/06/2017   CHF exacerbation (HCC) 08/21/2017   (HFpEF) heart failure with preserved ejection fraction (HCC) 08/20/2017    Lumbar radiculopathy 08/08/2017   Body mass index 50.0-59.9, adult (HCC) 08/08/2017   Hoarseness 08/08/2017   Chronic cough 04/10/2017   Rheumatoid arthritis involving multiple sites (HCC) 04/10/2017   Anxiety and depression 04/10/2017   Adjustment disorder with depressed mood 12/11/2016   Morbid obesity (HCC) 08/14/2016   Stroke (HCC) 06/27/2016   Right sided weakness 06/27/2016   Essential hypertension 06/27/2016   Atypical chest pain 06/27/2016   Difficult airway 03/29/2016   CTEPH (chronic thromboembolic pulmonary hypertension) (HCC) 03/14/2016   OSA on CPAP 02/20/2016   Chronic pain syndrome 01/31/2016   Pulmonary embolus (HCC) 11/15/2015   Vertigo 11/15/2015   Depression 11/15/2015    Past Medical History:  Diagnosis Date   Anemia    Anxiety    Arthritis    CHF exacerbation (HCC) 08/21/2017   Depression    Difficult intubation    See [facility] anesthesia records in Care Everywhere   Diverticulitis    Dyspnea    with exertion   Family history of adverse reaction to anesthesia    " My mother stopped breathing during procedure"   GERD (gastroesophageal reflux disease)    PMH; resolved   Hyperlipidemia    Hypertension    Obesity    Plantar fasciitis, left    Pneumonia    Port-A-Cath in place    uses d/t being a "Hard Stick"   Pre-diabetes    Pulmonary embolism (HCC)    Pulmonary hypertension (HCC)    Sleep apnea    wears CPAP # 9   Wears glasses     Past Surgical History:  Procedure Laterality Date   ABDOMINAL HYSTERECTOMY     BIOPSY  02/26/2020   Procedure: BIOPSY;  Surgeon: Kerin Salen, MD;  Location: [facility] ENDOSCOPY;  Service: Gastroenterology;;   BREAST REDUCTION SURGERY     CHOLECYSTECTOMY     COLONOSCOPY W/ BIOPSIES AND POLYPECTOMY     EMBOLECTOMY N/A    pulmonary embolectomy   ESOPHAGOGASTRODUODENOSCOPY N/A 02/26/2020   Procedure: ESOPHAGOGASTRODUODENOSCOPY (EGD);  Surgeon: Kerin Salen, MD;  Location: [facility] ENDOSCOPY;  Service: Gastroenterology;   Laterality: N/A;   FOOT SURGERY Left    bone spur removal   FRACTURE SURGERY     right ankle   GASTROCNEMIUS RECESSION Left 02/12/2019   Procedure: LEFT GASTROCNEMIUS RECESSION, PLANTAR FASCIA RELEASE;  Surgeon: Nadara Mustard, MD;  Location: [facility] OR;  Service: Orthopedics;  Laterality: Left;   HEMORROIDECTOMY     IVC FILTER INSERTION     for saddle embolus   KNEE ARTHROSCOPY W/ DEBRIDEMENT Bilateral    REDUCTION MAMMAPLASTY Bilateral    RIGHT HEART CATH N/A 10/28/2016   Procedure: Right Heart Cath;  Surgeon: Dolores Patty, MD;  Location: [facility] INVASIVE CV LAB;  Service: Cardiovascular;  Laterality: N/A;   RIGHT HEART CATH N/A 08/26/2017   Procedure: RIGHT HEART CATH;  Surgeon: Dolores Patty, MD;  Location: [facility] INVASIVE CV LAB;  Service: Cardiovascular;  Laterality: N/A;   RIGHT HEART CATH N/A 07/05/2019   Procedure: RIGHT HEART CATH;  Surgeon: Dolores Patty, MD;  Location: [facility] INVASIVE CV LAB;  Service: Cardiovascular;  Laterality: N/A;   RIGHT HEART CATH N/A 07/24/2020   Procedure: RIGHT HEART CATH;  Surgeon: Dolores Patty, MD;  Location: [facility] INVASIVE CV LAB;  Service: Cardiovascular;  Laterality: N/A;   RIGHT HEART CATH N/A 04/16/2022   Procedure: RIGHT HEART CATH;  Surgeon: Dolores Patty, MD;  Location: [facility] INVASIVE CV LAB;  Service: Cardiovascular;  Laterality: N/A;   ULTRASOUND GUIDANCE FOR VASCULAR ACCESS  08/26/2017   Procedure: Ultrasound Guidance For Vascular Access;  Surgeon: Dolores Patty, MD;  Location: [facility] INVASIVE CV LAB;  Service: Cardiovascular;;   WISDOM TOOTH EXTRACTION      Social History   Socioeconomic History   Marital status: Married    Spouse name: Not on file   Number of children: Not on file   Years of education: Not on file   Highest education level: 12th grade  Occupational History   Occupation: Disability  Tobacco Use   Smoking status: Never   Smokeless tobacco: Never  Vaping Use   Vaping status: Never Used  Substance  and Sexual Activity   Alcohol use: No   Drug use: No   Sexual activity: Yes    Partners: Male    Birth control/protection: Surgical  Other Topics Concern   Not on file  Social History Narrative   Not on file   Social Determinants of [name]   Financial Resource Strain: High Risk (10/25/2022)   Overall Financial Resource Strain (CARDIA)    Difficulty of Paying Living Expenses: Hard  Food Insecurity: Not on file  Transportation Needs: Not on file  Physical Activity: Not on file  Stress: Not on file  Social Connections: Not on file  Intimate Partner Violence: Not on file    Family History  Problem Relation Age of Onset   Lung cancer Maternal Grandmother    Dementia Mother    Dementia Father    Anxiety disorder Sister    Bipolar disorder Sister    Drug abuse Sister    Sexual abuse Maternal Aunt    Anxiety disorder Cousin    Anxiety disorder Sister    Bipolar disorder Sister  Drug abuse Sister    Breast cancer Neg Hx     Medications Prior to Admission  Medication Sig Dispense Refill Last Dose   albuterol (VENTOLIN HFA) 108 (90 Base) MCG/ACT inhaler Inhale 2 puffs into the lungs every 6 (six) hours as needed for wheezing or shortness of breath.   Past Month   atorvastatin (LIPITOR) 20 MG tablet Take 1 tablet (20 mg total) by mouth daily at 6 PM. 90 tablet 1 07/08/2023   Azelastine HCl 137 MCG/SPRAY SOLN Place 1 spray into both nostrils daily as needed (allergies).   Past Month   cyclobenzaprine (FLEXERIL) 10 MG tablet Take 10 mg by mouth every 12 (twelve) hours as needed for muscle spasms.   Past Week   docusate sodium (COLACE) 100 MG capsule Take 1 capsule (100 mg total) by mouth every 12 (twelve) hours. 30 capsule 0 07/08/2023   gabapentin (NEURONTIN) 100 MG capsule Take 100 mg by mouth as needed (nerve pain).   Past Month   HYDROcodone-acetaminophen (NORCO) 10-325 MG tablet Take 1 tablet by mouth 3 (three) times daily as needed for moderate pain.   07/08/2023    lanolin-mineral oil (BABY OIL) OIL 1-2 drops in the morning and at bedtime. Into ears as needed   07/08/2023   montelukast (SINGULAIR) 10 MG tablet Take 10 mg by mouth in the morning.   07/09/2023 at 0530   omeprazole (PRILOSEC) 40 MG capsule Take 40 mg by mouth 2 (two) times daily.   07/09/2023 at 0530   ondansetron (ZOFRAN) 4 MG tablet Take 4 mg by mouth every 8 (eight) hours as needed for nausea/vomiting.      OXYGEN Inhale 2 L into the lungs See admin instructions. At bedtime and during the day when resting   07/08/2023   polyethylene glycol (MIRALAX / GLYCOLAX) 17 g packet Take 17 g by mouth in the morning.   07/09/2023   potassium chloride (KLOR-CON) 10 MEQ tablet Take 2 tablets (20 mEq total) by mouth every morning. 60 tablet 5 07/08/2023   QUEtiapine (SEROQUEL) 300 MG tablet Take 300 mg by mouth at bedtime as needed (sleep).   Past Week   Riociguat (ADEMPAS) 2 MG TABS Take 2 mg by mouth 3 (three) times daily. 30 tablet 11 07/09/2023 at 0530   rivaroxaban (XARELTO) 20 MG TABS tablet Take 1 tablet (20 mg total) by mouth daily with supper. 90 tablet 3 07/05/2023   Semaglutide, 2 MG/DOSE, (OZEMPIC, 2 MG/DOSE,) 8 MG/3ML SOPN Inject 2 mg into the skin once a week.   07/01/2023   spironolactone (ALDACTONE) 25 MG tablet Take 0.5 tablets (12.5 mg total) by mouth daily. 45 tablet 3 07/08/2023   torsemide (DEMADEX) 20 MG tablet Take 1 tablet (20 mg total) by mouth daily. You may take 1 additional tablet as needed for swelling or a weight gain of 3 pounds or more in 24 hours or 5 pounds in one week. 45 tablet 5 07/08/2023   cetirizine (ZYRTEC) 10 MG tablet Take 10 mg by mouth daily.   More than a month at 0530    Current Facility-Administered Medications  Medication Dose Route Frequency Provider Last Rate Last Admin   bupivacaine liposome (EXPAREL) 1.3 % injection 266 mg  20 mL Infiltration Once Karie Soda, MD       ceFAZolin (ANCEF) IVPB 3g/100 mL premix  3 g Intravenous On Call to OR Karie Soda, MD       Chlorhexidine Gluconate Cloth 2 % PADS 6 each  6 each  Topical Once Karie Soda, MD       And   Chlorhexidine Gluconate Cloth 2 % PADS 6 each  6 each Topical Once Karie Soda, MD       Melene Muller ON 07/10/2023] feeding supplement (ENSURE PRE-SURGERY) liquid 296 mL  296 mL Oral Once Karie Soda, MD       feeding supplement (ENSURE PRE-SURGERY) liquid 592 mL  592 mL Oral Once Karie Soda, MD       lactated ringers infusion   Intravenous Continuous Collene Schlichter, MD 10 mL/hr at 07/09/23 0720 New Bag at 07/09/23 0720     Allergies  Allergen Reactions   Neomycin Itching, Swelling and Other (See Comments)    Topical--burning/itching/swelling    BP (!) 134/53   Pulse 89   Temp (!) 97.3 F (36.3 C) (Oral)   Resp 16   Ht 5\' 3"  (1.6 m)   Wt 136.1 kg   SpO2 99%   BMI 53.14 kg/m   Labs: Results for orders placed or performed during the hospital encounter of 07/08/23 (from the past 48 hour(s))  Basic metabolic panel     Status: Abnormal   Collection Time: 07/08/23 12:31 PM  Result Value Ref Range   Sodium 140 135 - 145 mmol/L   Potassium 3.9 3.5 - 5.1 mmol/L   Chloride 105 98 - 111 mmol/L   CO2 26 22 - 32 mmol/L   Glucose, Bld 94 70 - 99 mg/dL    Comment: Glucose reference range applies only to samples taken after fasting for at least 8 hours.   BUN 10 6 - 20 mg/dL   Creatinine, Ser 2.95 (H) 0.44 - 1.00 mg/dL   Calcium 9.6 8.9 - 28.4 mg/dL   GFR, Estimated 58 (L) >60 mL/min    Comment: (NOTE) Calculated using the CKD-EPI Creatinine Equation (2021)    Anion gap 9 5 - 15    Comment: Performed at [facility], [address] [place], [place] [address]   *Note: Due to a large number of results and/or encounters for the requested time period, some results have not been displayed. A complete set of results can be found in Results Review.    Imaging / Studies: No results found.   Ardeth Sportsman, M.D., F.A.C.S. Gastrointestinal and Minimally Invasive  Surgery [facility] [address], [address] [place], [place] [address] [phone] Main / Paging  07/09/2023 7:44 AM    Ardeth Sportsman

## 2023-07-09 NOTE — Transfer of Care (Signed)
Immediate Anesthesia Transfer of Care Note  Patient: [name] [name] [facility]  Procedure(s) Performed: LAPAROSCOPIC LEFT AND RIGHT INGUINAL HERNIA, BILATERAL TAP BLOCK LAPAROSCOPIC INCARCERATED PERIUMBILICAL INCISIONAL HERNIA REPAIR  Patient Location: PACU  Anesthesia Type:General  Level of Consciousness: awake, drowsy, and patient cooperative  Airway & Oxygen Therapy: Patient Spontanous Breathing and Patient connected to face mask oxygen  Post-op Assessment: Report given to RN and Post -op Vital signs reviewed and stable  Post vital signs: Reviewed and stable  Last Vitals:  Vitals Value Taken Time  BP 122/65 07/09/23 1146  Temp    Pulse 92 07/09/23 1148  Resp 19 07/09/23 1148  SpO2 100 % 07/09/23 1148  Vitals shown include unfiled device data.  Last Pain:  Vitals:   07/09/23 0640  TempSrc: Oral  PainSc: 8          Complications:  Encounter Notable Events  Notable Event Outcome Phase Comment  Difficult to intubate - expected  Intraprocedure Filed from anesthesia note documentation.

## 2023-07-09 NOTE — Discharge Instructions (Signed)
HERNIA REPAIR: POST OP INSTRUCTIONS  ######################################################################  EAT Gradually transition to a high fiber diet with a fiber supplement over the next few weeks after discharge.  Start with a pureed / full liquid diet (see below)  WALK Walk an hour a day.  Control your pain to do that.    CONTROL PAIN Control pain so that you can walk, sleep, tolerate sneezing/coughing, and go up/down stairs.  HAVE A BOWEL MOVEMENT DAILY Keep your bowels regular to avoid problems.  OK to try a laxative to override constipation.  OK to use an antidairrheal to slow down diarrhea.  Call if not better after 2 tries  CALL IF YOU HAVE PROBLEMS/CONCERNS Call if you are still struggling despite following these instructions. Call if you have concerns not answered by these instructions  ######################################################################    DIET: Follow a light bland diet & liquids the first 24 hours after arrival home, such as soup, liquids, starches, etc.  Be sure to drink plenty of fluids.  Quickly advance to a usual solid diet within a few days.  Avoid fast food or heavy meals as your are more likely to get nauseated or have irregular bowels.  A low-fat, high-fiber diet for the rest of your life is ideal.   Take your usually prescribed home medications unless otherwise directed.  PAIN CONTROL: Pain is best controlled by a usual combination of three different methods TOGETHER: Ice/Heat Over the counter pain medication Prescription pain medication Most patients will experience some swelling and bruising around the hernia(s) such as the bellybutton, groins, or old incisions.  Ice packs or heating pads (30-60 minutes up to 6 times a day) will help. Use ice for the first few days to help decrease swelling and bruising, then switch to heat to help relax tight/sore spots and speed recovery.  Some people prefer to use ice alone, heat alone, alternating  between ice & heat.  Experiment to what works for you.  Swelling and bruising can take several weeks to resolve.   It is helpful to take an over-the-counter pain medication regularly for the first few weeks.  Choose one of the following that works best for you: Naproxen (Aleve, etc)  Two 220mg tabs twice a day Ibuprofen (Advil, etc) Three 200mg tabs four times a day (every meal & bedtime) Acetaminophen (Tylenol, etc) 325-650mg four times a day (every meal & bedtime) A  prescription for pain medication should be given to you upon discharge.  Take your pain medication as prescribed.  If you are having problems/concerns with the prescription medicine (does not control pain, nausea, vomiting, rash, itching, etc), please call us [phone] to see if we need to switch you to a different pain medicine that will work better for you and/or control your side effect better. If you need a refill on your pain medication, please contact your pharmacy.  They will contact our office to request authorization. Prescriptions will not be filled after 5 pm or on week-ends.  AVOID GETTING CONSTIPATED.   Between the surgery and the pain medications, it is common to experience some constipation.  Drink plenty of liquids Take a fiber supplement 2 times day (such as Metamucil, Citrucel, FiberCon, MiraLax, etc) to have a bowel movement every day. If you have not had a BM by 2 days after surgery: -drink liquids only until you have a bowel movement -take MiraLAX 2 doses every 2 hours until you have a bowel movement   Wash / shower every day.  You may   shower over the dressings as they are waterproof.    It is good for closed incisions and even open wounds to be washed every day.  Shower every day.  Short baths are fine.  Wash the incisions and wounds clean with soap & water.    You may leave closed incisions open to air if it is dry.   You may cover the incision with clean gauze & replace it after your daily shower for  comfort.  TEGADERM & STERISTRIPS:  You have clear gauze band-aid dressings over your closed incision(s).  You also have skin tapes called Steristrips on your incisions.  Leave these in place.  You may trim any edges that curl up with clean scissors.  You may remove all dressings & tapes in the shower in 3 days.    ACTIVITIES as tolerated:   You may resume regular (light) daily activities beginning the next day--such as daily self-care, walking, climbing stairs--gradually increasing activities as tolerated.  Control your pain so that you can walk an hour a day.  If you can walk 30 minutes without difficulty, it is safe to try more intense activity such as jogging, treadmill, bicycling, low-impact aerobics, swimming, etc. Save the most intensive and strenuous activity for last such as sit-ups, heavy lifting, contact sports, etc  Refrain from any heavy lifting or straining until you are off narcotics for pain control.   DO NOT PUSH THROUGH PAIN.  Let pain be your guide: If it hurts to do something, don't do it.  Pain is your body warning you to avoid that activity for another week until the pain goes down. You may drive when you are no longer taking prescription pain medication, you can comfortably wear a seatbelt, and you can safely maneuver your car and apply brakes. You may have sexual intercourse when it is comfortable.   FOLLOW UP in our office Please call [facility] at [phone] to set up an appointment to see your surgeon in the office for a follow-up appointment approximately 2-3 weeks after your surgery. Make sure that you call for this appointment the day you arrive home to insure a convenient appointment time.  9.  If you have disability of FMLA / Family leave forms, please bring the forms to the office for processing.  (do not give to your surgeon).  WHEN TO CALL US [phone]: Poor pain control Reactions / problems with new medications (rash/itching, nausea, etc)  Fever over 101.5 F  (38.5 C) Inability to urinate Nausea and/or vomiting Worsening swelling or bruising Continued bleeding from incision. Increased pain, redness, or drainage from the incision   The clinic staff is available to answer your questions during regular business hours (8:30am-5pm).  Please don't hesitate to call and ask to speak to one of our nurses for clinical concerns.   If you have a medical emergency, go to the nearest emergency room or call 911.  A surgeon from [facility] is always on call at the hospitals in [place]  [facility] [address], [address], [place], [place]  [address] ?  [address] [address], [place], [place]   [address] MAIN: [phone] ? TOLL FREE: [phone] ? FAX: [phone] [email or web address]  

## 2023-07-09 NOTE — Plan of Care (Signed)
 [facility] Tonsillectomy/Adenoidectomy, Postoperative PEDS care plan entered in error.

## 2023-07-09 NOTE — Anesthesia Postprocedure Evaluation (Signed)
Anesthesia Post Note  Patient: Havynn Casterline  Procedure(s) Performed: LAPAROSCOPIC LEFT AND RIGHT INGUINAL HERNIA, BILATERAL TAP BLOCK LAPAROSCOPIC INCARCERATED PERIUMBILICAL INCISIONAL HERNIA REPAIR     Patient location during evaluation: PACU Anesthesia Type: General Level of consciousness: awake and alert Pain management: pain level controlled Vital Signs Assessment: post-procedure vital signs reviewed and stable Respiratory status: spontaneous breathing, nonlabored ventilation, respiratory function stable and patient connected to nasal cannula oxygen Cardiovascular status: blood pressure returned to baseline and stable Postop Assessment: no apparent nausea or vomiting Anesthetic complications: yes   Encounter Notable Events  Notable Event Outcome Phase Comment  Difficult to intubate - expected  Intraprocedure Filed from anesthesia note documentation.    Last Vitals:  Vitals:   07/09/23 1230 07/09/23 1245  BP: (!) 140/80 (!) 143/98  Pulse: 93 93  Resp: (!) 27 (!) 24  Temp:    SpO2: 100% 100%    Last Pain:  Vitals:   07/09/23 1245  TempSrc:   PainSc: Asleep                 Collene Schlichter

## 2023-07-09 NOTE — Anesthesia Procedure Notes (Signed)
Procedure Name: Intubation Date/Time: 07/09/2023 8:34 AM  Performed by: Chinita Pester, CRNAPre-anesthesia Checklist: Patient identified, Emergency Drugs available, Suction available and Patient being monitored Patient Re-evaluated:Patient Re-evaluated prior to induction Oxygen Delivery Method: Circle System Utilized Preoxygenation: Pre-oxygenation with 100% oxygen Induction Type: IV induction Ventilation: Mask ventilation without difficulty Laryngoscope Size: Glidescope and 4 Grade View: Grade I Tube type: Oral Tube size: 7.0 mm Number of attempts: 2 Airway Equipment and Method: Oral airway, Patient positioned with wedge pillow, Rigid stylet and Video-laryngoscopy Placement Confirmation: ETT inserted through vocal cords under direct vision, positive ETCO2 and breath sounds checked- equal and bilateral Secured at: 23 cm Tube secured with: Tape Dental Injury: Teeth and Oropharynx as per pre-operative assessment  Difficulty Due To: Difficulty was anticipated Comments: Glide scope lo-pro #4 provided best visualization; grade 2 vvc obtained with lo-pro 3, unable to advance tube with lo-pro 3 as tissue from airway obstructing advancement of tube into trachea. With lo-pro 4, grade 1 view obtained, excessive tissue in airway was mobilized and tube able to advance into trachea atraumatically

## 2023-07-10 ENCOUNTER — Encounter (HOSPITAL_COMMUNITY): Payer: Self-pay | Admitting: Surgery

## 2023-07-10 ENCOUNTER — Other Ambulatory Visit: Payer: Self-pay

## 2023-07-10 ENCOUNTER — Other Ambulatory Visit (HOSPITAL_COMMUNITY): Payer: Self-pay

## 2023-07-10 DIAGNOSIS — K430 Incisional hernia with obstruction, without gangrene: Secondary | ICD-10-CM | POA: Diagnosis not present

## 2023-07-10 LAB — GLUCOSE, CAPILLARY: Glucose-Capillary: 102 mg/dL — ABNORMAL HIGH (ref 70–99)

## 2023-07-10 MED ORDER — GABAPENTIN 400 MG PO CAPS
400.0000 mg | ORAL_CAPSULE | Freq: Three times a day (TID) | ORAL | Status: DC
  Administered 2023-07-10 – 2023-07-11 (×3): 400 mg via ORAL
  Filled 2023-07-10 (×3): qty 1

## 2023-07-10 MED ORDER — CYCLOBENZAPRINE HCL 10 MG PO TABS
10.0000 mg | ORAL_TABLET | Freq: Three times a day (TID) | ORAL | 2 refills | Status: DC | PRN
  Filled 2023-07-10 – 2023-07-14 (×2): qty 30, 10d supply, fill #0
  Filled 2023-08-01: qty 30, 10d supply, fill #1
  Filled 2023-08-13 – 2023-08-25 (×3): qty 30, 10d supply, fill #2

## 2023-07-10 MED ORDER — GABAPENTIN 300 MG PO CAPS
300.0000 mg | ORAL_CAPSULE | Freq: Three times a day (TID) | ORAL | 2 refills | Status: DC
  Filled 2023-07-10 – 2023-07-14 (×2): qty 60, 20d supply, fill #0
  Filled 2023-08-10: qty 60, 20d supply, fill #1
  Filled 2023-09-16: qty 60, 20d supply, fill #2

## 2023-07-10 MED ORDER — CYCLOBENZAPRINE HCL 10 MG PO TABS
10.0000 mg | ORAL_TABLET | Freq: Three times a day (TID) | ORAL | Status: DC
  Administered 2023-07-10 – 2023-07-11 (×3): 10 mg via ORAL
  Filled 2023-07-10 (×3): qty 1

## 2023-07-10 MED ORDER — HYDROCODONE-ACETAMINOPHEN 10-325 MG PO TABS
1.0000 | ORAL_TABLET | ORAL | Status: DC | PRN
  Administered 2023-07-10 – 2023-07-11 (×3): 2 via ORAL
  Filled 2023-07-10 (×3): qty 2

## 2023-07-10 NOTE — Plan of Care (Signed)

## 2023-07-10 NOTE — Progress Notes (Addendum)
07/10/2023  [name] [name] [id number] [birth year]  CARE TEAM: PCP: Hillery Aldo, NP  Outpatient Care Team: Patient Care Team: Hillery Aldo, NP as PCP - Erline Hau, MD as Consulting Physician (General Surgery) Bensimhon, Bevelyn Buckles, MD as Consulting Physician (Cardiology) Chilton Greathouse, MD as Consulting Physician (Pulmonary Disease) Shirleen Schirmer, PA-C as Physician Assistant (Gastroenterology)  Inpatient Treatment Team: Treatment Team:  Karie Soda, MD Mertie Clause, LPN Diop, Awa, NT Jolyne Loa, Lenord Carbo, RN Hinton Lovely, RN   Problem List:   Principal Problem:   Incisional hernia   07/09/2023  POST-OPERATIVE DIAGNOSIS:  VENTRAL WALL INCARCERATED INCISIONAL HERNIA BILATERAL INGUINAL HERNIAS POSSIBLE RIGHT INGUINAL HERNIA   PROCEDURE:   LAPAROSCOPIC REPAIR OF  INCISIONAL INCARCERATED ABDOMINAL WALL HERNIA WITH MESH LAPAROSCOPIC REPAIR OF BILATERAL INGUINAL HERNIAS WITH MESH TAP BLOCK - BILATERAL   SURGEON:  Ardeth Sportsman, MD  OR FINDINGS: Swiss cheese periumbilical hernia incarcerated with omentum and falciform ligament 4 x 4 cm region.   Type of ventral wall repair: Laparoscopic underlay repair.  Primary repair of largest hernia  Placement of mesh: Centrally intraperitoneal with edges tucked into RECTRORECTUS & preperitoneal space Name of mesh: Bard Ventralight dual sided (polypropylene / Seprafilm) Size of mesh: 20x15cm Orientation: Transverse Mesh overlap:  5-7cm   Left greater than right indirect inguinal hernias.  Some laxity in the left obturator foramina but no true hernia there.  No femoral hernias.        Assessment [facility] Stay = 0 days) 1 Day Post-Op    Stable but challenges in pain control.    Plan:  -No nausea vomiting liquid diet.  Can advance as tolerated to solid diet. -Challenging pain control in this morbid obese movement with what sounds like fibromyalgia and chronic pain  on chronic narcotics gabapentin and muscle relaxers.  Will increase the doses of those gradually.  Continue abdominal binder and ice and heat.  Usually patients go home with the surgery but given her morbid obesity and chronic pain along with other medical issues, agreed with overnight observation.  This morning she declared she is hoping to stay a few more days.  Noted that is not typical but we will try and further increase multimodal pain control with ice/heat, abdominal binder, opiates, muscle relaxers, neuropathic pain medication.    Mobilize her to see if she can successfully discharge later this afternoon.  We will see.    -VTE prophylaxis- SCDs, etc  -mobilize as tolerated to help recovery       I reviewed nursing notes, last 24 h vitals and pain scores, last 48 h intake and output, last 24 h labs and trends, and last 24 h imaging results.  I have reviewed this patient's available data, including medical history, events of note, test results, etc as part of my evaluation.   A significant portion of that time was spent in counseling. Care during the described time interval was provided by me.  This care required moderate level of medical decision making.  07/10/2023    Subjective: (Chief complaint)  Patient sitting up smiling.  Having some soreness.  Staying in bed.  Husband sleeping in room.  Objective:  Vital signs:  Vitals:   07/10/23 0139 07/10/23 0627 07/10/23 1000 07/10/23 1412  BP: 121/71 116/68 139/68 (!) 129/59  Pulse: 88 67 85 84  Resp: 18 18 16 16   Temp: 98.7 F (37.1 C) 98.1 F (36.7 C) 97.7 F (36.5 C) 98.2 F (36.8 C)  TempSrc:  Oral Oral Oral Oral  SpO2: 100% 100% 98% 100%  Weight:      Height:        Last BM Date : 07/08/23  Intake/Output   Yesterday:  10/30 0701 - 10/31 0700 In: 2090.3 [P.O.:360; I.V.:1730.3] Out: 1620 [Urine:1600; Blood:20] This shift:  Total I/O In: -  Out: 800 [Urine:800]  Bowel function:  Flatus:  YES  BM:  No  Drain: (No drain)   Physical Exam:  General: Pt awake/alert in no acute distress Eyes: PERRL, normal EOM.  Sclera clear.  No icterus Neuro: CN II-XII intact w/o focal sensory/motor deficits. Lymph: No head/neck/groin lymphadenopathy Psych:  No delerium/psychosis/paranoia.  Oriented x 4 HENT: Normocephalic, Mucus membranes moist.  No thrush Neck: Supple, No tracheal deviation.  No obvious thyromegaly Chest: No pain to chest wall compression.  Good respiratory excursion.  No audible wheezing CV:  Pulses intact.  Regular rhythm.  No major extremity edema MS: Normal AROM mjr joints.  No obvious deformity  Abdomen: Soft.  Nondistended.  Mildly tender at incisions only.  Some old blood and one lower dressing but no active bleeding.  Dressings clean and dry.  No evidence of peritonitis.  No incarcerated hernias.  Ext:   No deformity.  No mjr edema.  No cyanosis Skin: No petechiae / purpurea.  No major sores.  Warm and dry    Results:   Cultures: No results found for this or any previous visit (from the past 720 hour(s)).  Labs: Results for orders placed or performed during the hospital encounter of 07/09/23 (from the past 48 hour(s))  Glucose, capillary     Status: Abnormal   Collection Time: 07/09/23 11:56 AM  Result Value Ref Range   Glucose-Capillary 145 (H) 70 - 99 mg/dL    Comment: Glucose reference range applies only to samples taken after fasting for at least 8 hours.  CBC     Status: Abnormal   Collection Time: 07/09/23  4:28 PM  Result Value Ref Range   WBC 17.1 (H) 4.0 - 10.5 K/uL   RBC 5.97 (H) 3.87 - 5.11 MIL/uL   Hemoglobin 13.0 12.0 - 15.0 g/dL   HCT 95.2 84.1 - 32.4 %   MCV 69.0 (L) 80.0 - 100.0 fL   MCH 21.8 (L) 26.0 - 34.0 pg   MCHC 31.6 30.0 - 36.0 g/dL   RDW 40.1 (H) 02.7 - 25.3 %   Platelets 348 150 - 400 K/uL    Comment: REPEATED TO VERIFY   nRBC 0.0 0.0 - 0.2 %    Comment: Performed at [facility], [address], [place], [place] [address]  Creatinine, serum     Status: None   Collection Time: 07/09/23  4:28 PM  Result Value Ref Range   Creatinine, Ser 0.85 0.44 - 1.00 mg/dL   GFR, Estimated >34 >74 mL/min    Comment: (NOTE) Calculated using the CKD-EPI Creatinine Equation (2021) Performed at [facility], [address], [place], [place] [address]    *Note: Due to a large number of results and/or encounters for the requested time period, some results have not been displayed. A complete set of results can be found in Results Review.    Imaging / Studies: No results found.  Medications / Allergies: per chart  Antibiotics: Anti-infectives (From admission, onward)    Start     Dose/Rate Route Frequency Ordered Stop   07/09/23 0630  ceFAZolin (ANCEF) IVPB 3g/100 mL premix  3 g 200 mL/hr over 30 Minutes Intravenous On call to O.R. 07/09/23 0623 07/09/23 0900         Note: Portions of this report may have been transcribed using voice recognition software. Every effort was made to ensure accuracy; however, inadvertent computerized transcription errors may be present.   Any transcriptional errors that result from this process are unintentional.    Ardeth Sportsman, MD, FACS, MASCRS Esophageal, Gastrointestinal & Colorectal Surgery Robotic and Minimally Invasive Surgery  [facility] [address], [address] [place], [place] [address] [phone] Fax [phone] Main  CONTACT INFORMATION: Weekday (9AM-5PM): Call [facility] main office at [phone] Weeknight (5PM-9AM) or Weekend/Holiday: Check EPIC "Web Links" tab & use "AMION" (password " [email or web address]") for General Surgery [facility] coverage  Please, DO NOT use SecureChat  (it is not reliable communication to reach operating surgeons & will lead to a delay in care).   Epic staff messaging available for outptient concerns needing 1-2 business day response.       07/10/2023  3:06 PM

## 2023-07-10 NOTE — TOC Progression Note (Signed)
Transition of Care [facility]) - Progression Note    Patient Details  Name: [name] [name] MRN: [id number] Date of Birth: 11/08/1960  Transition of [facility]) CM/SW Contact  Adrian Prows, RN Phone Number: 07/10/2023, 2:15 PM  Clinical Narrative:    TOC notified pt has called insurance to appeal d/c; spoke w/ pt and husband in room; pt notified she is not able to appeal d/c d/t admission status; pt says she spoke w/ someone and was told "she could stay 2 more days; explained to pt MD decides medical stability for d/c; pt says she is not ready for d/c because her diet had not been advanced, and she had bleeding from her incision on yesterday; pt says she does not need to stay 2 additional days, and she will be ready for d/c in the morning; Dr Michaell Cowing notified via secure chat; Sharol Roussel, [facility] supervisor also notified.        Expected Discharge Plan and Services         Expected Discharge Date: 07/10/23                                     Social Determinants of Health (SDOH) Interventions SDOH Screenings   Food Insecurity: No Food Insecurity (07/09/2023)  Housing: Low Risk  (07/09/2023)  Transportation Needs: No Transportation Needs (07/09/2023)  Utilities: Not At Risk (07/09/2023)  Depression (PHQ2-9): Medium Risk (11/28/2021)  Financial Resource Strain: High Risk (10/25/2022)  Tobacco Use: Low Risk  (07/09/2023)  Recent Concern: Tobacco Use - Medium Risk (04/22/2023)   Received from [facility]    Readmission Risk Interventions     No data to display

## 2023-07-10 NOTE — Progress Notes (Signed)
   07/10/23 0854  TOC Brief Assessment  Insurance and Status Reviewed  Patient has primary care physician Yes  Home environment has been reviewed Resides with spouse  Prior level of function: Independent at baseline  Prior/Current Home Services No current home services  Social Determinants of Health Reivew SDOH reviewed no interventions necessary  Readmission risk has been reviewed Yes  Transition of care needs no transition of care needs at this time

## 2023-07-10 NOTE — Progress Notes (Signed)
Chaplain engaged in an initial visit with Jillianne and her husband. Chaplain provided Scientist, water quality, Healthcare POA education and support. Chaplain let them know how to reach her when they are ready to have it notarized and signed.     07/10/23 1000  Spiritual Encounters  Type of Visit Initial  Care provided to: Pt and family  Referral source Patient request  Reason for visit Advance directives  Spiritual Framework  Presenting Themes Goals in life/care  Interventions  Spiritual Care Interventions Made Decision-making support/facilitation;Established relationship of care and support

## 2023-07-11 ENCOUNTER — Other Ambulatory Visit (HOSPITAL_COMMUNITY): Payer: Self-pay

## 2023-07-11 DIAGNOSIS — K430 Incisional hernia with obstruction, without gangrene: Secondary | ICD-10-CM | POA: Diagnosis not present

## 2023-07-11 MED ORDER — HEPARIN SOD (PORK) LOCK FLUSH 100 UNIT/ML IV SOLN
500.0000 [IU] | INTRAVENOUS | Status: AC | PRN
  Administered 2023-07-11: 500 [IU]
  Filled 2023-07-11: qty 5

## 2023-07-11 NOTE — Progress Notes (Signed)
AVS reviewed w/ pt & husband who verbalized an understanding. PIV removed as documented - pt taking a shower prior to d/c. Home meds returned to pt. Discharge meds will be picked up by pt on 11/4 from [facility]- not due to be filled until then. No other questions at this time

## 2023-07-14 ENCOUNTER — Other Ambulatory Visit (HOSPITAL_COMMUNITY): Payer: Self-pay

## 2023-07-14 ENCOUNTER — Other Ambulatory Visit: Payer: Self-pay

## 2023-07-16 ENCOUNTER — Other Ambulatory Visit (HOSPITAL_COMMUNITY): Payer: Self-pay

## 2023-08-01 ENCOUNTER — Other Ambulatory Visit: Payer: Self-pay

## 2023-08-01 ENCOUNTER — Other Ambulatory Visit (HOSPITAL_COMMUNITY): Payer: Self-pay

## 2023-08-04 ENCOUNTER — Other Ambulatory Visit (HOSPITAL_COMMUNITY): Payer: Self-pay

## 2023-08-10 ENCOUNTER — Other Ambulatory Visit (HOSPITAL_COMMUNITY): Payer: Self-pay

## 2023-08-11 ENCOUNTER — Other Ambulatory Visit (HOSPITAL_COMMUNITY): Payer: Self-pay

## 2023-08-12 ENCOUNTER — Ambulatory Visit (INDEPENDENT_AMBULATORY_CARE_PROVIDER_SITE_OTHER): Payer: Medicare HMO | Admitting: "Endocrinology

## 2023-08-12 ENCOUNTER — Encounter: Payer: Self-pay | Admitting: "Endocrinology

## 2023-08-12 ENCOUNTER — Other Ambulatory Visit (HOSPITAL_COMMUNITY): Payer: Self-pay | Admitting: Pharmacist

## 2023-08-12 VITALS — BP 140/90 | HR 56 | Ht 63.0 in | Wt 309.0 lb

## 2023-08-12 DIAGNOSIS — E0590 Thyrotoxicosis, unspecified without thyrotoxic crisis or storm: Secondary | ICD-10-CM | POA: Diagnosis not present

## 2023-08-12 DIAGNOSIS — R490 Dysphonia: Secondary | ICD-10-CM | POA: Diagnosis not present

## 2023-08-12 MED ORDER — ADEMPAS 2 MG PO TABS: 2.0000 mg | ORAL_TABLET | Freq: Three times a day (TID) | ORAL | 11 refills | Status: DC

## 2023-08-12 NOTE — Progress Notes (Signed)
Outpatient Endocrinology Note [name] [place], MD  08/12/23   [name] [name] 30-Jun-1961 [id number]  Referring Provider: Hillery Aldo, NP Primary Care Provider: Hillery Aldo, NP Subjective  No chief complaint on file.   Assessment & Plan  Diagnoses and all orders for this visit:  Hyperthyroidism -     Thyroid stimulating immunoglobulin -     T4, free -     T3, free -     TSH -     TRAb (TSH Receptor Binding Antibody)  Hoarseness of voice    [name] [name] has never been on any thyroid medication. Patient was biochemically hyperthyroid on 04/2023 with TSH of 0.2.  Discussed the etiology for hyperthyroidism. Educated on thyroid axis.  Recommend the following: repeat labs today. Discussed about methimazole in detail in case needed after blood work.  Complications of untreated hyperthyroidism include atrial fibrillation, heart failure and osteoporosis Side effects of Methimazole include but not limited to allergic reaction, rash, bone marrow suppression and liver dysfunction  Deports deeper voice Pemberton's sign is negative Seen ENT and voice clinic in past    I have reviewed current medications, nurse's notes, allergies, vital signs, past medical and surgical history, family medical history, and social history for this encounter. Counseled patient on symptoms, examination findings, lab findings, imaging results, treatment decisions and monitoring and prognosis. The patient understood the recommendations and agrees with the treatment plan. All questions regarding treatment plan were fully answered.   Return in about 3 months (around 11/10/2023) for visit.   [name] [place], MD  08/12/23   I have reviewed current medications, nurse's notes, allergies, vital signs, past medical and surgical history, family medical history, and social history for this encounter. Counseled patient on symptoms, examination findings, lab findings, imaging results, treatment  decisions and monitoring and prognosis. The patient understood the recommendations and agrees with the treatment plan. All questions regarding treatment plan were fully answered.   History of Present Illness [name] [name] is a 62 y.o. year old female who presents to our clinic with hyperthyroidism diagnosed in 2024.    Never been on any thyroid medication  Symptoms suggestive of HYPERTHYROIDISM:  weight loss  Yes on ozempic  heat intolerance Yes hyperdefecation  Yes palpitations  Yes, recent. Has a cardiologist for heart failure   Compressive symptoms:  dysphagia  No dysphonia  Yes hoarse and deeper  positional dyspnea (especially with simultaneous arms elevation)  No  Smokes  No On biotin  No Personal history of head/neck surgery/irradiation  No  Sister has RAI ablation for hyperthyroid   Physical Exam  BP (!) 140/90   Pulse (!) 56   Ht 5\' 3"  (1.6 m)   Wt (!) 309 lb (140.2 kg)   SpO2 96%   BMI 54.74 kg/m  Constitutional: well developed, well nourished Head: normocephalic, atraumatic, no exophthalmos Eyes: sclera anicteric, no redness Neck: no thyromegaly, no thyroid tenderness; no nodules palpated Lungs: normal respiratory effort Neurology: alert and oriented, no fine hand tremor Skin: dry, no appreciable rashes Musculoskeletal: no appreciable defects Psychiatric: normal mood and affect  Allergies Allergies  Allergen Reactions   Neomycin Itching, Swelling and Other (See Comments)    Topical--burning/itching/swelling    Current Medications Patient's Medications  New Prescriptions   No medications on file  Previous Medications   ALBUTEROL (VENTOLIN HFA) 108 (90 BASE) MCG/ACT INHALER    Inhale 2 puffs into the lungs every 6 (six) hours as needed for wheezing or shortness of breath.  ATORVASTATIN (LIPITOR) 20 MG TABLET    Take 1 tablet (20 mg total) by mouth daily at 6 PM.   AZELASTINE HCL 137 MCG/SPRAY SOLN    Place 1 spray into both nostrils daily as  needed (allergies).   CETIRIZINE (ZYRTEC) 10 MG TABLET    Take 10 mg by mouth daily.   CYCLOBENZAPRINE (FLEXERIL) 10 MG TABLET    Take 1 tablet (10 mg total) by mouth 3 (three) times daily as needed for muscle spasms.   DOCUSATE SODIUM (COLACE) 100 MG CAPSULE    Take 1 capsule (100 mg total) by mouth every 12 (twelve) hours.   GABAPENTIN (NEURONTIN) 300 MG CAPSULE    Take 1 capsule (300 mg total) by mouth 3 (three) times daily. Increase to 4x/day as needed   HYDROCODONE-ACETAMINOPHEN (NORCO) 10-325 MG TABLET    Take 1 tablet by mouth every 6 (six) hours as needed for moderate pain (pain score 4-6).   LANOLIN-MINERAL OIL (BABY OIL) OIL    1-2 drops in the morning and at bedtime. Into ears as needed   MONTELUKAST (SINGULAIR) 10 MG TABLET    Take 10 mg by mouth in the morning.   OMEPRAZOLE (PRILOSEC) 40 MG CAPSULE    Take 40 mg by mouth 2 (two) times daily.   ONDANSETRON (ZOFRAN) 4 MG TABLET    Take 4 mg by mouth every 8 (eight) hours as needed for nausea/vomiting.   OXYGEN    Inhale 2 L into the lungs See admin instructions. At bedtime and during the day when resting   POLYETHYLENE GLYCOL (MIRALAX / GLYCOLAX) 17 G PACKET    Take 17 g by mouth in the morning.   POTASSIUM CHLORIDE (KLOR-CON) 10 MEQ TABLET    Take 2 tablets (20 mEq total) by mouth every morning.   QUETIAPINE (SEROQUEL) 300 MG TABLET    Take 300 mg by mouth at bedtime as needed (sleep).   RIOCIGUAT (ADEMPAS) 2 MG TABS    Take 2 mg by mouth 3 (three) times daily.   RIVAROXABAN (XARELTO) 20 MG TABS TABLET    Take 1 tablet (20 mg total) by mouth daily with supper.   SEMAGLUTIDE, 2 MG/DOSE, (OZEMPIC, 2 MG/DOSE,) 8 MG/3ML SOPN    Inject 2 mg into the skin once a week.   SPIRONOLACTONE (ALDACTONE) 25 MG TABLET    Take 0.5 tablets (12.5 mg total) by mouth daily.   TORSEMIDE (DEMADEX) 20 MG TABLET    Take 1 tablet (20 mg total) by mouth daily. You may take 1 additional tablet as needed for swelling or a weight gain of 3 pounds or more in 24  hours or 5 pounds in one week.  Modified Medications   No medications on file  Discontinued Medications   No medications on file    Past Medical History Past Medical History:  Diagnosis Date   Anemia    Anxiety    Arthritis    CHF exacerbation (HCC) 08/21/2017   Depression    Difficult intubation    See [facility] anesthesia records in Care Everywhere   Diverticulitis    Dyspnea    with exertion   Family history of adverse reaction to anesthesia    " My mother stopped breathing during procedure"   GERD (gastroesophageal reflux disease)    PMH; resolved   Hyperlipidemia    Hypertension    Obesity    Plantar fasciitis, left    Pneumonia    Port-A-Cath in place    uses d/t being  a "Hard Stick"   Pre-diabetes    Pulmonary embolism (HCC)    Pulmonary hypertension (HCC)    Sleep apnea    wears CPAP # 9   Wears glasses     Past Surgical History Past Surgical History:  Procedure Laterality Date   ABDOMINAL HYSTERECTOMY     BIOPSY  02/26/2020   Procedure: BIOPSY;  Surgeon: Kerin Salen, MD;  Location: [facility] ENDOSCOPY;  Service: Gastroenterology;;   BREAST REDUCTION SURGERY     CHOLECYSTECTOMY     COLONOSCOPY W/ BIOPSIES AND POLYPECTOMY     EMBOLECTOMY N/A    pulmonary embolectomy   ESOPHAGOGASTRODUODENOSCOPY N/A 02/26/2020   Procedure: ESOPHAGOGASTRODUODENOSCOPY (EGD);  Surgeon: Kerin Salen, MD;  Location: [facility] ENDOSCOPY;  Service: Gastroenterology;  Laterality: N/A;   FOOT SURGERY Left    bone spur removal   FRACTURE SURGERY     right ankle   GASTROCNEMIUS RECESSION Left 02/12/2019   Procedure: LEFT GASTROCNEMIUS RECESSION, PLANTAR FASCIA RELEASE;  Surgeon: Nadara Mustard, MD;  Location: [facility] OR;  Service: Orthopedics;  Laterality: Left;   HEMORROIDECTOMY     INGUINAL HERNIA REPAIR N/A 07/09/2023   Procedure: LAPAROSCOPIC LEFT AND RIGHT INGUINAL HERNIA, BILATERAL TAP BLOCK;  Surgeon: Karie Soda, MD;  Location: [facility] ORS;  Service: General;  Laterality: N/A;  180   IVC FILTER  INSERTION     for saddle embolus   KNEE ARTHROSCOPY W/ DEBRIDEMENT Bilateral    REDUCTION MAMMAPLASTY Bilateral    RIGHT HEART CATH N/A 10/28/2016   Procedure: Right Heart Cath;  Surgeon: Dolores Patty, MD;  Location: [facility] INVASIVE CV LAB;  Service: Cardiovascular;  Laterality: N/A;   RIGHT HEART CATH N/A 08/26/2017   Procedure: RIGHT HEART CATH;  Surgeon: Dolores Patty, MD;  Location: [facility] INVASIVE CV LAB;  Service: Cardiovascular;  Laterality: N/A;   RIGHT HEART CATH N/A 07/05/2019   Procedure: RIGHT HEART CATH;  Surgeon: Dolores Patty, MD;  Location: [facility] INVASIVE CV LAB;  Service: Cardiovascular;  Laterality: N/A;   RIGHT HEART CATH N/A 07/24/2020   Procedure: RIGHT HEART CATH;  Surgeon: Dolores Patty, MD;  Location: [facility] INVASIVE CV LAB;  Service: Cardiovascular;  Laterality: N/A;   RIGHT HEART CATH N/A 04/16/2022   Procedure: RIGHT HEART CATH;  Surgeon: Dolores Patty, MD;  Location: [facility] INVASIVE CV LAB;  Service: Cardiovascular;  Laterality: N/A;   ULTRASOUND GUIDANCE FOR VASCULAR ACCESS  08/26/2017   Procedure: Ultrasound Guidance For Vascular Access;  Surgeon: Dolores Patty, MD;  Location: [facility] INVASIVE CV LAB;  Service: Cardiovascular;;   UMBILICAL HERNIA REPAIR N/A 07/09/2023   Procedure: LAPAROSCOPIC INCARCERATED PERIUMBILICAL INCISIONAL HERNIA REPAIR;  Surgeon: Karie Soda, MD;  Location: [facility] ORS;  Service: General;  Laterality: N/A;   WISDOM TOOTH EXTRACTION      Family History family history includes Anxiety disorder in her cousin, sister, and sister; Bipolar disorder in her sister and sister; Dementia in her father and mother; Drug abuse in her sister and sister; Lung cancer in her maternal grandmother; Sexual abuse in her maternal aunt.  Social History Social History   Socioeconomic History   Marital status: Married    Spouse name: Not on file   Number of children: Not on file   Years of education: Not on file   Highest education level: 12th  grade  Occupational History   Occupation: Disability  Tobacco Use   Smoking status: Never   Smokeless tobacco: Never  Vaping Use   Vaping status: Never Used  Substance  and Sexual Activity   Alcohol use: No   Drug use: No   Sexual activity: Yes    Partners: Male    Birth control/protection: Surgical  Other Topics Concern   Not on file  Social History Narrative   Not on file   Social Determinants of Health   Financial Resource Strain: High Risk (10/25/2022)   Overall Financial Resource Strain (CARDIA)    Difficulty of Paying Living Expenses: Hard  Food Insecurity: No Food Insecurity (07/09/2023)   Hunger Vital Sign    Worried About Running Out of Food in the Last Year: Never true    Ran Out of Food in the Last Year: Never true  Transportation Needs: No Transportation Needs (07/09/2023)   PRAPARE - Administrator, Civil Service (Medical): No    Lack of Transportation (Non-Medical): No  Physical Activity: Not on file  Stress: Not on file  Social Connections: Not on file  Intimate Partner Violence: Not At Risk (07/09/2023)   Humiliation, Afraid, Rape, and Kick questionnaire    Fear of Current or Ex-Partner: No    Emotionally Abused: No    Physically Abused: No    Sexually Abused: No    Laboratory Investigations Lab Results  Component Value Date   TSH 0.201 (L) 05/05/2023   TSH 0.114 (L) 12/23/2022   TSH 0.405 11/07/2021   FREET4 0.81 05/02/2021   FREET4 0.91 12/30/2019   FREET4 1.00 03/02/2018     No results found for: "TSI"   No components found for: "TRAB"   Lab Results  Component Value Date   CHOL 126 12/23/2022   Lab Results  Component Value Date   HDL 42 12/23/2022   Lab Results  Component Value Date   LDLCALC 73 12/23/2022   Lab Results  Component Value Date   TRIG 57 12/23/2022   Lab Results  Component Value Date   CHOLHDL 3.0 12/23/2022   Lab Results  Component Value Date   CREATININE 0.85 07/09/2023   No results found for:  "GFR"    Component Value Date/Time   NA 140 07/08/2023 1231   K 3.9 07/08/2023 1231   CL 105 07/08/2023 1231   CO2 26 07/08/2023 1231   GLUCOSE 94 07/08/2023 1231   BUN 10 07/08/2023 1231   CREATININE 0.85 07/09/2023 1628   CREATININE 1.11 (H) 11/20/2015 1718   CALCIUM 9.6 07/08/2023 1231   CALCIUM QNSTST 11/07/2021 1436   PROT 7.9 06/25/2023 1353   ALBUMIN 3.9 06/25/2023 1353   AST 13 (L) 06/25/2023 1353   ALT 11 06/25/2023 1353   ALKPHOS 81 06/25/2023 1353   BILITOT 0.7 06/25/2023 1353   GFRNONAA >60 07/09/2023 1628   GFRAA >60 06/01/2020 1349      Latest Ref Rng & Units 07/09/2023    4:28 PM 07/08/2023   12:31 PM 06/25/2023    1:53 PM  BMP  Glucose 70 - 99 mg/dL  94  88   BUN 6 - 20 mg/dL  10  11   Creatinine 1.47 - 1.00 mg/dL 8.29  5.62  1.30   Sodium 135 - 145 mmol/L  140  140   Potassium 3.5 - 5.1 mmol/L  3.9  3.7   Chloride 98 - 111 mmol/L  105  103   CO2 22 - 32 mmol/L  26  26   Calcium 8.9 - 10.3 mg/dL  9.6  9.7        Component Value Date/Time   WBC 17.1 (H) 07/09/2023 1628  RBC 5.97 (H) 07/09/2023 1628   HGB 13.0 07/09/2023 1628   HCT 41.2 07/09/2023 1628   HCT 33.7 (L) 06/27/2016 0830   PLT 348 07/09/2023 1628   MCV 69.0 (L) 07/09/2023 1628   MCH 21.8 (L) 07/09/2023 1628   MCHC 31.6 07/09/2023 1628   RDW 17.8 (H) 07/09/2023 1628   LYMPHSABS 2.0 05/05/2023 1135   MONOABS 0.5 05/05/2023 1135   EOSABS 0.2 05/05/2023 1135   BASOSABS 0.0 05/05/2023 1135      Parts of this note may have been dictated using voice recognition software. There may be variances in spelling and vocabulary which are unintentional. Not all errors are proofread. Please notify the Thereasa Parkin if any discrepancies are noted or if the meaning of any statement is not clear.

## 2023-08-13 ENCOUNTER — Other Ambulatory Visit (HOSPITAL_COMMUNITY): Payer: Self-pay

## 2023-08-14 ENCOUNTER — Encounter (HOSPITAL_COMMUNITY): Payer: Medicare HMO

## 2023-09-12 ENCOUNTER — Non-Acute Institutional Stay (HOSPITAL_COMMUNITY)
Admission: RE | Admit: 2023-09-12 | Discharge: 2023-09-12 | Disposition: A | Discharge status: Home / Self Care | Payer: Medicare HMO | Source: Ambulatory Visit | Attending: Internal Medicine | Admitting: Internal Medicine

## 2023-09-12 DIAGNOSIS — Z79899 Other long term (current) drug therapy: Secondary | ICD-10-CM | POA: Insufficient documentation

## 2023-09-12 DIAGNOSIS — E0590 Thyrotoxicosis, unspecified without thyrotoxic crisis or storm: Secondary | ICD-10-CM

## 2023-09-12 LAB — CBC WITH DIFFERENTIAL/PLATELET
Abs Immature Granulocytes: 0.03 10*3/uL (ref 0.00–0.07)
Basophils Absolute: 0 10*3/uL (ref 0.0–0.1)
Basophils Relative: 0 %
Eosinophils Absolute: 0.3 10*3/uL (ref 0.0–0.5)
Eosinophils Relative: 4 %
HCT: 33 % — ABNORMAL LOW (ref 36.0–46.0)
Hemoglobin: 10.6 g/dL — ABNORMAL LOW (ref 12.0–15.0)
Immature Granulocytes: 0 %
Lymphocytes Relative: 33 %
Lymphs Abs: 2.3 10*3/uL (ref 0.7–4.0)
MCH: 22.2 pg — ABNORMAL LOW (ref 26.0–34.0)
MCHC: 32.1 g/dL (ref 30.0–36.0)
MCV: 69 fL — ABNORMAL LOW (ref 80.0–100.0)
Monocytes Absolute: 0.5 10*3/uL (ref 0.1–1.0)
Monocytes Relative: 7 %
Neutro Abs: 3.7 10*3/uL (ref 1.7–7.7)
Neutrophils Relative %: 56 %
Platelets: 327 10*3/uL (ref 150–400)
RBC: 4.78 MIL/uL (ref 3.87–5.11)
RDW: 18.5 % — ABNORMAL HIGH (ref 11.5–15.5)
WBC: 6.9 10*3/uL (ref 4.0–10.5)
nRBC: 0 % (ref 0.0–0.2)

## 2023-09-12 LAB — HEPATITIS PANEL, ACUTE
HCV Ab: NONREACTIVE
Hep A IgM: NONREACTIVE
Hep B C IgM: NONREACTIVE
Hepatitis B Surface Ag: NONREACTIVE

## 2023-09-12 LAB — COMPREHENSIVE METABOLIC PANEL
ALT: 9 U/L (ref 0–44)
AST: 13 U/L — ABNORMAL LOW (ref 15–41)
Albumin: 3.3 g/dL — ABNORMAL LOW (ref 3.5–5.0)
Alkaline Phosphatase: 70 U/L (ref 38–126)
Anion gap: 7 (ref 5–15)
BUN: 15 mg/dL (ref 8–23)
CO2: 27 mmol/L (ref 22–32)
Calcium: 9.2 mg/dL (ref 8.9–10.3)
Chloride: 105 mmol/L (ref 98–111)
Creatinine, Ser: 0.99 mg/dL (ref 0.44–1.00)
GFR, Estimated: >60 mL/min (ref >60)
Glucose, Bld: 103 mg/dL — ABNORMAL HIGH (ref 70–99)
Potassium: 3.3 mmol/L — ABNORMAL LOW (ref 3.5–5.1)
Sodium: 139 mmol/L (ref 135–145)
Total Bilirubin: 0.3 mg/dL (ref 0.0–1.2)
Total Protein: 7.4 g/dL (ref 6.5–8.1)

## 2023-09-12 LAB — T4, FREE: Free T4: 0.87 ng/dL (ref 0.61–1.12)

## 2023-09-12 LAB — TSH: TSH: 0.377 u[IU]/mL (ref 0.350–4.500)

## 2023-09-12 MED ORDER — HEPARIN SOD (PORK) LOCK FLUSH 100 UNIT/ML IV SOLN
500.0000 [IU] | INTRAVENOUS | Status: AC | PRN
  Administered 2023-09-12: 500 [IU]
  Filled 2023-09-12: qty 5

## 2023-09-12 MED ORDER — SODIUM CHLORIDE 0.9% FLUSH
10.0000 mL | INTRAVENOUS | Status: AC | PRN
  Administered 2023-09-12: 10 mL

## 2023-09-12 NOTE — Progress Notes (Signed)
 PATIENT CARE CENTER NOTE  Provider: Campbell Reynolds NP  Procedure: PAC flush and lab draw (#11 of 11)  Note: Pts PAC accessed using sterile technique, and  ordered labs drawn (CBC w/diff, CMP, and Hepatitis panel ordered by Reynolds Campbell, NP and T4 free, T3 free, and TSH ordered by Dartha Ernst MD) via PAC. Pt tolerated with no issues. PAC de-accessed and flushed with 0.9% Sodium Chloride  and Heparin , site is covered with a band-aid. Pt is alert, oriented, and ambulatory at discharge.

## 2023-09-13 LAB — T3, FREE: T3, Free: 2.3 pg/mL (ref 2.0–4.4)

## 2023-09-16 ENCOUNTER — Other Ambulatory Visit: Payer: Self-pay

## 2023-09-16 ENCOUNTER — Other Ambulatory Visit (HOSPITAL_COMMUNITY): Payer: Self-pay

## 2023-09-17 ENCOUNTER — Other Ambulatory Visit (HOSPITAL_COMMUNITY): Payer: Self-pay

## 2023-09-18 ENCOUNTER — Other Ambulatory Visit (HOSPITAL_COMMUNITY): Payer: Self-pay

## 2023-10-08 ENCOUNTER — Other Ambulatory Visit (HOSPITAL_COMMUNITY): Payer: Self-pay

## 2023-10-21 ENCOUNTER — Other Ambulatory Visit (HOSPITAL_COMMUNITY): Payer: Self-pay | Admitting: Internal Medicine

## 2023-10-21 ENCOUNTER — Telehealth (HOSPITAL_COMMUNITY): Payer: Self-pay | Admitting: Pharmacy Technician

## 2023-10-21 ENCOUNTER — Other Ambulatory Visit (HOSPITAL_COMMUNITY): Payer: Self-pay

## 2023-10-21 NOTE — Telephone Encounter (Signed)
Advanced Heart Failure Patient Advocate Encounter  Patient called in requesting refill of Xarelto. I could see that there was a refill sent 07/03/23 to [facility]. The patient obtained her last refill 06/25/23, before the refill was sent in. The patient should not need a refill again until October 2025. Patient aware and verbalized understanding.  Archer Asa, CPhT

## 2023-10-22 ENCOUNTER — Other Ambulatory Visit (HOSPITAL_COMMUNITY): Payer: Self-pay

## 2023-10-22 MED ORDER — RIVAROXABAN 20 MG PO TABS
20.0000 mg | ORAL_TABLET | Freq: Every day | ORAL | 3 refills | Status: DC
  Filled 2023-10-22: qty 90, 90d supply, fill #0
  Filled 2024-01-22: qty 90, 90d supply, fill #1

## 2023-10-24 ENCOUNTER — Other Ambulatory Visit (HOSPITAL_COMMUNITY): Payer: Self-pay

## 2023-11-12 ENCOUNTER — Ambulatory Visit: Payer: Medicare HMO | Admitting: "Endocrinology

## 2023-11-12 ENCOUNTER — Telehealth: Payer: Self-pay | Admitting: "Endocrinology

## 2023-11-12 NOTE — Telephone Encounter (Signed)
 Patient given results  as directed by MD.

## 2023-11-12 NOTE — Telephone Encounter (Signed)
 This patient wants to be called back with her most recent lab results.

## 2023-11-12 NOTE — Telephone Encounter (Signed)
 Patient was called to reschedule no show appointment and she asked that she be given her most recent lab results.

## 2023-11-26 ENCOUNTER — Non-Acute Institutional Stay (HOSPITAL_COMMUNITY)
Admission: RE | Admit: 2023-11-26 | Discharge: 2023-11-26 | Disposition: A | Discharge status: Home / Self Care | Source: Ambulatory Visit | Attending: Internal Medicine | Admitting: Internal Medicine

## 2023-11-26 DIAGNOSIS — Z452 Encounter for adjustment and management of vascular access device: Secondary | ICD-10-CM | POA: Diagnosis present

## 2023-11-26 MED ORDER — HEPARIN SOD (PORK) LOCK FLUSH 100 UNIT/ML IV SOLN
500.0000 [IU] | INTRAVENOUS | Status: AC | PRN
  Administered 2023-11-26: 500 [IU]

## 2023-11-26 MED ORDER — SODIUM CHLORIDE 0.9% FLUSH
10.0000 mL | INTRAVENOUS | Status: AC | PRN
  Administered 2023-11-26: 10 mL

## 2023-11-26 NOTE — Progress Notes (Signed)
 PATIENT CARE CENTER NOTE:  Provider:  Hillery Aldo NP     Procedure: Port-a-cath flush ( # 1 of 11)     Note: Patient's PAC accessed using sterile technique, brisk blood return noted,  flushed with 10 cc 0.9% sodium chloride and heparin. PAC de-accessed and site covered with band aids and gauze. Patient tolerated well. Patient declined AVS, states she will schedule monthly appointment. Alert, oriented and ambulatory with cane at discharge.

## 2023-12-19 ENCOUNTER — Other Ambulatory Visit (HOSPITAL_COMMUNITY): Payer: Self-pay | Admitting: Internal Medicine

## 2023-12-30 ENCOUNTER — Ambulatory Visit: Admitting: "Endocrinology

## 2023-12-30 ENCOUNTER — Non-Acute Institutional Stay (HOSPITAL_COMMUNITY)
Admission: RE | Admit: 2023-12-30 | Discharge: 2023-12-30 | Disposition: A | Discharge status: Home / Self Care | Source: Ambulatory Visit | Attending: Internal Medicine | Admitting: Internal Medicine

## 2023-12-30 DIAGNOSIS — Z79899 Other long term (current) drug therapy: Secondary | ICD-10-CM | POA: Insufficient documentation

## 2023-12-30 DIAGNOSIS — I5032 Chronic diastolic (congestive) heart failure: Secondary | ICD-10-CM | POA: Diagnosis not present

## 2023-12-30 DIAGNOSIS — Z452 Encounter for adjustment and management of vascular access device: Secondary | ICD-10-CM | POA: Insufficient documentation

## 2023-12-30 LAB — COMPREHENSIVE METABOLIC PANEL WITH GFR
ALT: 9 U/L (ref 0–44)
AST: 14 U/L — ABNORMAL LOW (ref 15–41)
Albumin: 3.3 g/dL — ABNORMAL LOW (ref 3.5–5.0)
Alkaline Phosphatase: 73 U/L (ref 38–126)
Anion gap: 7 (ref 5–15)
BUN: 15 mg/dL (ref 8–23)
CO2: 27 mmol/L (ref 22–32)
Calcium: 9.4 mg/dL (ref 8.9–10.3)
Chloride: 105 mmol/L (ref 98–111)
Creatinine, Ser: 0.75 mg/dL (ref 0.44–1.00)
GFR, Estimated: >60 mL/min (ref >60)
Glucose, Bld: 111 mg/dL — ABNORMAL HIGH (ref 70–99)
Potassium: 3.3 mmol/L — ABNORMAL LOW (ref 3.5–5.1)
Sodium: 139 mmol/L (ref 135–145)
Total Bilirubin: 0.5 mg/dL (ref 0.0–1.2)
Total Protein: 7 g/dL (ref 6.5–8.1)

## 2023-12-30 LAB — CBC WITH DIFFERENTIAL/PLATELET
Abs Immature Granulocytes: 0.03 10*3/uL (ref 0.00–0.07)
Basophils Absolute: 0 10*3/uL (ref 0.0–0.1)
Basophils Relative: 0 %
Eosinophils Absolute: 0.1 10*3/uL (ref 0.0–0.5)
Eosinophils Relative: 2 %
HCT: 34.2 % — ABNORMAL LOW (ref 36.0–46.0)
Hemoglobin: 10.7 g/dL — ABNORMAL LOW (ref 12.0–15.0)
Immature Granulocytes: 0 %
Lymphocytes Relative: 32 %
Lymphs Abs: 2.3 10*3/uL (ref 0.7–4.0)
MCH: 21.6 pg — ABNORMAL LOW (ref 26.0–34.0)
MCHC: 31.3 g/dL (ref 30.0–36.0)
MCV: 69.1 fL — ABNORMAL LOW (ref 80.0–100.0)
Monocytes Absolute: 0.5 10*3/uL (ref 0.1–1.0)
Monocytes Relative: 7 %
Neutro Abs: 4.2 10*3/uL (ref 1.7–7.7)
Neutrophils Relative %: 59 %
Platelets: 343 10*3/uL (ref 150–400)
RBC: 4.95 MIL/uL (ref 3.87–5.11)
RDW: 18.4 % — ABNORMAL HIGH (ref 11.5–15.5)
WBC: 7.2 10*3/uL (ref 4.0–10.5)
nRBC: 0 % (ref 0.0–0.2)

## 2023-12-30 LAB — LIPID PANEL
Cholesterol: 113 mg/dL (ref 0–200)
HDL: 31 mg/dL — ABNORMAL LOW (ref >40)
LDL Cholesterol: 65 mg/dL (ref 0–99)
Total CHOL/HDL Ratio: 3.6 ratio
Triglycerides: 85 mg/dL (ref <150)
VLDL: 17 mg/dL (ref 0–40)

## 2023-12-30 LAB — TSH: TSH: 0.201 u[IU]/mL — ABNORMAL LOW (ref 0.350–4.500)

## 2023-12-30 LAB — HEMOGLOBIN A1C
Hgb A1c MFr Bld: 5.8 % — ABNORMAL HIGH (ref 4.8–5.6)
Mean Plasma Glucose: 119.76 mg/dL

## 2023-12-30 LAB — BRAIN NATRIURETIC PEPTIDE: B Natriuretic Peptide: 51.8 pg/mL (ref 0.0–100.0)

## 2023-12-30 MED ORDER — HEPARIN SOD (PORK) LOCK FLUSH 100 UNIT/ML IV SOLN
500.0000 [IU] | INTRAVENOUS | Status: AC | PRN
  Administered 2023-12-30: 500 [IU]
  Filled 2023-12-30: qty 5

## 2023-12-30 MED ORDER — SODIUM CHLORIDE 0.9% FLUSH
10.0000 mL | INTRAVENOUS | Status: AC | PRN
  Administered 2023-12-30: 10 mL

## 2023-12-30 NOTE — Progress Notes (Signed)
 PATIENT CARE CENTER NOTE  Diagnosis: Z79.899   Provider: Maryellen Snare, NP  Procedure: PAC flush and labs (#2 of 11)  Note: Pts PAC accessed using sterile technique, and ordered labs drawn (CBC w/diff, CMP, Lipid Panel, TSH, BNP, A1c, and BMP drawn via PAC. Pt tolerated with no issues. PAC de-accessed and flushed with 0.9% Sodium Chloride  and Heparin , site is covered with gauze, and a band-aid. AVS offered, but pt declined. Pt is alert, oriented, and ambulatory at discharge.

## 2024-01-02 ENCOUNTER — Encounter (HOSPITAL_COMMUNITY)

## 2024-01-19 ENCOUNTER — Other Ambulatory Visit (HOSPITAL_COMMUNITY): Payer: Self-pay | Admitting: Internal Medicine

## 2024-01-22 ENCOUNTER — Other Ambulatory Visit (HOSPITAL_COMMUNITY): Payer: Self-pay

## 2024-01-23 ENCOUNTER — Other Ambulatory Visit (HOSPITAL_COMMUNITY): Payer: Self-pay

## 2024-01-29 ENCOUNTER — Non-Acute Institutional Stay (HOSPITAL_COMMUNITY)

## 2024-02-12 ENCOUNTER — Ambulatory Visit: Admitting: "Endocrinology

## 2024-02-16 ENCOUNTER — Non-Acute Institutional Stay (HOSPITAL_COMMUNITY)

## 2024-02-18 ENCOUNTER — Non-Acute Institutional Stay (HOSPITAL_COMMUNITY)
Admission: RE | Admit: 2024-02-18 | Discharge: 2024-02-18 | Disposition: A | Discharge status: Home / Self Care | Source: Ambulatory Visit | Attending: Internal Medicine | Admitting: Internal Medicine

## 2024-02-18 DIAGNOSIS — Z79899 Other long term (current) drug therapy: Secondary | ICD-10-CM | POA: Diagnosis not present

## 2024-02-18 DIAGNOSIS — Z452 Encounter for adjustment and management of vascular access device: Secondary | ICD-10-CM | POA: Insufficient documentation

## 2024-02-18 MED ORDER — HEPARIN SOD (PORK) LOCK FLUSH 100 UNIT/ML IV SOLN
500.0000 [IU] | INTRAVENOUS | Status: AC | PRN
  Administered 2024-02-18: 500 [IU]
  Filled 2024-02-18: qty 5

## 2024-02-18 MED ORDER — SODIUM CHLORIDE 0.9% FLUSH
10.0000 mL | INTRAVENOUS | Status: AC | PRN
  Administered 2024-02-18: 10 mL

## 2024-02-18 NOTE — Progress Notes (Signed)
 PATIENT CARE CENTER NOTE:  Diagnosis: Z79.899  Provider: Maryellen Snare FNP  Procedure: Port-a-cath flush ( #3 of 11)     Note: Patient's PAC accessed using sterile technique, flushed with 10 cc 0.9% sodium chloride  and heparin . Brisk blood return noted. No labs required today per orders. PAC de-accessed and site covered with band aid. Patient tolerated well. Pt declined printed AVS. Pt instructed to schedule monthly visit at front desk prior to leaving , verbalized understanding.  Alert, oriented and ambulatory with cane at discharge.

## 2024-03-01 ENCOUNTER — Other Ambulatory Visit (HOSPITAL_COMMUNITY): Payer: Self-pay | Admitting: Internal Medicine

## 2024-03-15 ENCOUNTER — Ambulatory Visit: Admitting: "Endocrinology

## 2024-03-19 ENCOUNTER — Non-Acute Institutional Stay (HOSPITAL_COMMUNITY)

## 2024-03-23 ENCOUNTER — Encounter (HOSPITAL_COMMUNITY)

## 2024-04-07 ENCOUNTER — Encounter (HOSPITAL_COMMUNITY)

## 2024-04-07 ENCOUNTER — Institutional Professional Consult (permissible substitution) (INDEPENDENT_AMBULATORY_CARE_PROVIDER_SITE_OTHER): Admitting: Physician Assistant

## 2024-04-07 NOTE — Progress Notes (Incomplete)
 Pulmonary Hypertension Clinic Note   Primary Care: Campbell Reynolds, NP Primary HF MD: Dr Cherrie   HPI: [name] [name] is a 63 y.o. 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   In 09/2015 diagnosed with saddle PE in [place] and started on anticoagulation. She was very briefly on warfarin but quickly switched to Xarelto .   She was referred to [facility] in 4/17. Echocardiogram showed normal LVEF 55-60%, normal LA, normal RV size, RVSP estimated as 70 mm Hg. She further was referred to [facility] and saw Dr. Avram on 03/14/2016 where patient had several studies including V/Q scan which found high prob for PE; studies c/w CTEPH. It was felt that her chronic chest pain was caused by chronic PE. RHC/LHC/Pulm Angiogram 03/2016 confirmed diagnosis of CTEPH. She underwent pulmonary thromboembolectomy 03/31/2016 at [facility] with IVC filter placement. Pre-op cath with no CAD.   Seen by Dr. Avram in 04/2016 for follow up. He felt like her heart size was decreasing. Plan was for lifelong OAC.  CT angio at [facility] 05/15/16 showed  persistent but partially recanalized PE within right lower lobe pulmonary artery. No new PE identified.   She was admitted to [facility] in 06/2016 for continued chest pain and parasthesias. V/Q scan 06/27/16 showed prominent ventilation perfusion mismatch in right c/w chronic PE. Head CT negative. She was continued on Xarelto .  Admitted to Behavioral [facility] for suicidal ideation. She was then sent to [facility] for additional treatment.   RHC in 10/20 demonstrated mild PAH (50/16 (28)) with high output (8.1l/min) and normal PVR (2.0 WU). Not candidate for additional selective pulmonary artery vasodialtors   Sleep study 9/22: AHI 4.4  In 11/21 had worsening symptoms. RHC unchanged PA 52/18 (30). PVR 2.8  RHC  8/23 showed mild PAH and normal CO. Largely unchanged from previous.   She is tentatively scheduled for laparoscopic bilateral inguinal hernia and periumbilical hernia repair with Dr. Sheldon on 10/30.   She presents today for 1 wk f/u. She was seen last week and noted to be volume overloaded. Not feeling well for a couple of weeks. Had seen her PCP about 1 wk prior and had a chest xray which reportedly showed pulmonary edema (report unavailable). She reported increased dyspnea with exertion, abdominal bloating and 11 lb weight. Lasix  was stopped and she was switched to 40 mg Torsemide  daily X 3 days then instructed to cut back to 20 mg daily. Spiro continued at 12.5 mg daily.   She presents back today to reassess volume status and symptoms prior to surgery tomorrow.   She reports significant improvement in breathing and increase in exercise tolerance w/ torsemide . Her wt is down 9 lb from 309>>300 lb today. Leg and abdominal edema/bloating resolved. BP 100/60 but no orthostatic symptoms.   She has been holding her ozempic  for surgery. Last dose was 1 wk ago.    Cardiac Studies - RHC 8/23):  RA = 10 RV = 55/15 PA = 51/22 (32) PCW = 14 Fick cardiac output/index = 7.0/3.0 PVR = 2.6 FA sat = 98% PA sat = 68%, 71% SVC sat = 62% PAPi = 2.9 Assessment: 1. Mild PAH with normal cardiac output 2. Numbers unchanged from previous   - RHC (11/21) RA = 8 RV = 49/10 PA = 52/18 (30) PCW = 12 Fick cardiac output/index =  6.5/2.7 PVR = 2.8 WU FA sat = 98% PA sat = 67%, 68% PaPi = 3.6  - RHC (10/20) RA = 7 RV = 50/12 PA = 50/16 (28) PCW = 11 Fick cardiac output/index = 8.1/3.4 PVR = 2.0 WU FA sat = 99% PA sat = 72%, 77%  - Echo (8/18): EF 55-60%, RV normal, RVSP 20 mmHg  - RHC (2/18) RA = 8 RV = 64/13 PA = 65/19 (35) PCW = 9 Fick cardiac output/index = 7.5/3.1 PVR = 3.5 WU Ao sat = 96% PA sat = 68%,70% SVC sat = 71%  Assessment 1. Mild residual PAH in the setting of CTEPH  s/p pulmonary thrombolectomy 2. Normal cardiac output 3. Normal left-sided filling pressures   - V/Q scan (06/27/16): Prominent ventilation perfusion mismatch noted on the right. Finding suggests high probability right-sided pulmonary embolus in this patient with known right-sided pulmonary embolus.   - CT angio (05/15/16): Persistent but partially recanalized pulmonary embolus within the right lower lobe pulmonary artery. No new pulmonary embolism is identified.Minimal bibasilar atelectatic changes stable from the prior exam.  - Echo (04/05/16): NORMAL LEFT VENTRICULAR FUNCTION WITH MILD LVH SEVERE RV SYSTOLIC DYSFUNCTION (See above) VALVULAR REGURGITATION: TRIVIAL PR, MILD TR NO VALVULAR STENOSIS TRIVIAL PERICARDIAL EFFUSION Compared with prior Echo study on 03/14/2016: RVSP increased from 50 to 69  - RHC at [facility] (03/25/2016): RA 11, RVSP 68, PA mean 40, PCW 14, PVR 4.7 WU, CI 2.3  - Echo (4/17): EF 55-60%,  - Pulmonary arteries: PA peak pressure: 70 mm Hg (S). - Pericardium, extracardiac: A trivial pericardial effusion was   identified.   Past Medical History:  Diagnosis Date   Anemia    Anxiety    Arthritis    CHF exacerbation (HCC) 08/21/2017   Depression    Difficult intubation    See [facility] anesthesia records in Care Everywhere   Diverticulitis    Dyspnea    with exertion   Family history of adverse reaction to anesthesia     My mother stopped breathing during procedure   GERD (gastroesophageal reflux disease)    PMH; resolved   Hyperlipidemia    Hypertension    Obesity    Plantar fasciitis, left    Pneumonia    Port-A-Cath in place    uses d/t being a Hard Stick   Pre-diabetes    Pulmonary embolism (HCC)    Pulmonary hypertension (HCC)    Sleep apnea    wears CPAP # 9   Wears glasses    Current Outpatient Medications  Medication Sig Dispense Refill   albuterol  (VENTOLIN  HFA) 108 (90 Base) MCG/ACT inhaler Inhale 2 puffs into the lungs every 6 (six)  hours as needed for wheezing or shortness of breath.     atorvastatin  (LIPITOR) 20 MG tablet Take 1 tablet (20 mg total) by mouth daily at 6 PM. 90 tablet 1   Azelastine  HCl 137 MCG/SPRAY SOLN Place 1 spray into both nostrils daily as needed (allergies).     cetirizine (ZYRTEC) 10 MG tablet Take 10 mg by mouth daily.     cyclobenzaprine  (FLEXERIL ) 10 MG tablet Take 1 tablet (10 mg total) by mouth 3 (three) times daily as needed for muscle spasms. 30 tablet 2   docusate sodium  (COLACE) 100 MG capsule Take 1 capsule (100 mg total) by mouth every 12 (twelve) hours. 30 capsule 0   gabapentin  (NEURONTIN ) 300 MG capsule Take 1 capsule (300 mg total) by mouth 3 (three) times daily. Increase to 4x/day  as needed 60 capsule 2   HYDROcodone -acetaminophen  (NORCO) 10-325 MG tablet Take 1 tablet by mouth every 6 (six) hours as needed for moderate pain (pain score 4-6). 30 tablet 0   lanolin-mineral oil (BABY OIL) OIL 1-2 drops in the morning and at bedtime. Into ears as needed     montelukast  (SINGULAIR ) 10 MG tablet Take 10 mg by mouth in the morning. (Patient not taking: Reported on 08/12/2023)     omeprazole  (PRILOSEC) 40 MG capsule Take 40 mg by mouth 2 (two) times daily.     ondansetron  (ZOFRAN ) 4 MG tablet Take 4 mg by mouth every 8 (eight) hours as needed for nausea/vomiting.     OXYGEN  Inhale 2 L into the lungs See admin instructions. At bedtime and during the day when resting     polyethylene glycol (MIRALAX  / GLYCOLAX ) 17 g packet Take 17 g by mouth in the morning.     potassium chloride  (KLOR-CON ) 10 MEQ tablet Take 2 tablets (20 mEq total) by mouth every morning. 60 tablet 5   QUEtiapine  (SEROQUEL ) 300 MG tablet Take 300 mg by mouth at bedtime as needed (sleep).     Riociguat  (ADEMPAS ) 2 MG TABS Take 2 mg by mouth 3 (three) times daily. 90 tablet 11   rivaroxaban  (XARELTO ) 20 MG TABS tablet Take 1 tablet (20 mg total) by mouth daily with supper. 90 tablet 3   rivaroxaban  (XARELTO ) 20 MG TABS tablet  Take 1 tablet (20 mg total) by mouth daily with supper. 90 tablet 3   Semaglutide , 2 MG/DOSE, (OZEMPIC , 2 MG/DOSE,) 8 MG/3ML SOPN Inject 2 mg into the skin once a week.     spironolactone  (ALDACTONE ) 25 MG tablet TAKE 1/2 (ONE-HALF) TABLET BY MOUTH ONCE DAILY . APPOINTMENT REQUIRED FOR FUTURE REFILLS 15 tablet 0   torsemide  (DEMADEX ) 20 MG tablet Take 1 tablet (20 mg total) by mouth daily. You may take 1 additional tablet as needed for swelling or a weight gain of 3 pounds or more in 24 hours or 5 pounds in one week. 45 tablet 5   No current facility-administered medications for this visit.   Allergies  Allergen Reactions   Neomycin Itching, Swelling and Other (See Comments)    Topical--burning/itching/swelling    Social History   Socioeconomic History   Marital status: Married    Spouse name: Not on file   Number of children: Not on file   Years of education: Not on file   Highest education level: 12th grade  Occupational History   Occupation: Disability  Tobacco Use   Smoking status: Never   Smokeless tobacco: Never  Vaping Use   Vaping status: Never Used  Substance and Sexual Activity   Alcohol use: No   Drug use: No   Sexual activity: Yes    Partners: Male    Birth control/protection: Surgical  Other Topics Concern   Not on file  Social History Narrative   Not on file   Social Drivers of Health   Financial Resource Strain: High Risk (10/25/2022)   Overall Financial Resource Strain (CARDIA)    Difficulty of Paying Living Expenses: Hard  Food Insecurity: Low Risk  (10/07/2023)   Received from [facility]   Hunger Vital Sign    Within the past 12 months, you worried that your food would run out before you got money to buy more: Never true    Within the past 12 months, the food you bought just didn't last and you didn't have money to  get more. : Never true  Transportation Needs: No Transportation Needs (10/07/2023)   Received from [facility]   Transportation     In the past 12 months, has lack of reliable transportation kept you from medical appointments, meetings, work or from getting things needed for daily living? : No  Physical Activity: Not on file  Stress: Not on file  Social Connections: Not on file  Intimate Partner Violence: Not At Risk (07/09/2023)   Humiliation, Afraid, Rape, and Kick questionnaire    Fear of Current or Ex-Partner: No    Emotionally Abused: No    Physically Abused: No    Sexually Abused: No   Family History  Problem Relation Age of Onset   Lung cancer Maternal Grandmother    Dementia Mother    Dementia Father    Anxiety disorder Sister    Bipolar disorder Sister    Drug abuse Sister    Sexual abuse Maternal Aunt    Anxiety disorder Cousin    Anxiety disorder Sister    Bipolar disorder Sister    Drug abuse Sister    Breast cancer Neg Hx    There were no vitals taken for this visit.  Wt Readings from Last 3 Encounters:  08/12/23 (!) 140.2 kg (309 lb)  07/09/23 136.1 kg (300 lb)  07/08/23 136.1 kg (300 lb)   PHYSICAL EXAM: General:  Well appearing, obese. No respiratory difficulty HEENT: normal Neck: supple. no JVD. Carotids 2+ bilat; no bruits. No lymphadenopathy or thyromegaly appreciated. Cor: PMI nondisplaced. Regular rate & rhythm. No rubs, gallops or murmurs. Lungs: clear Abdomen: obese, soft, nontender, nondistended. No hepatosplenomegaly. No bruits or masses. Good bowel sounds. Extremities: no cyanosis, clubbing, rash, edema Neuro: alert & oriented x 3, cranial nerves grossly intact. moves all 4 extremities w/o difficulty. Affect pleasant.    ASSESSMENT & PLAN: 1. 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 This is stable dating back to 2017 - RHC (12/18): with very  mild PAH and PVR 3.1 WU - Echo (10/19):  EF 55-60% RV not well seen but looks ok No septal flattening  - Echo (9/20): EF 65% RV dilated with normal function. Septum flat.  - RHC (10/20): mild PAH 50/16 (28) with high output (8.1l/min) and normal PVR (2.0 WU). Not candidate for additional selective pulmonary artery vasodilators. - RHC (11/21): PA 52/18 (30) PCW 12 PVR 2.8 WU - RHC (8/23): RA 10, PA 51/22 (32), PCW 14, PVR 2.6 WU - Echo 5/24 EF 60-65%, RVSFx normal, RVSP 24 mmHg  - (10/29/22) 237.7 meters; 95-99% on room air. - Volume assessment difficult given body habitus but no peripheral edema on exam. Wt down 9 lb and back to b/l w/ switch to torsemide  and functional status has improved from NYHA Class III>>II.  - continue torsemide  20 mg daily + KCL to 20 mEq daily. Check BMP today  - Continue spiro 12.5 mg daily. - Continue Adempas  2 mg tid and Xarelto .   2. Chronic chest pain  - Has chronic PE s/p thrombectomy, LHC performed at [facility] 7/17 with no CAD.  - Resolved  3. Palpitations - Zio (2/23): 5 runs SVT. Rare PVCS  4. Fibromyalgia/Rheumatoid Arthritis  - Follows with Rheumatology.  - No change.  5. Morbid obesity  - There is no height or weight on file to calculate BMI. - On Ozempic  but holding for upcomming surgery   6. Nocturnal hypoxemia - Followed by Dr. Shellia - Sleep study with AHI 4.4. Nocturnal hypoxemia noted - Now off CPAP. Wearing O2 at night  7. Depression/anxiety - Receiving outpatient therapy.   - Per PCP   F/u w/ Dr. Cherrie in 2-3 months   Harlene HERO [facility] PA-C  04/07/24

## 2024-04-09 ENCOUNTER — Telehealth (HOSPITAL_COMMUNITY): Payer: Self-pay

## 2024-04-09 NOTE — Telephone Encounter (Signed)
 Called to confirm/remind patient of their appointment at the [facility] Clinic on 04/12/24 3:30.   Appointment:   [] Confirmed  [] Left mess   [] No answer/No voice mail  [] VM Full/unable to leave message  [] Phone not in service  Patient reminded to bring all medications and/or complete list.  Confirmed patient has transportation. Gave directions, instructed to utilize valet parking.

## 2024-04-12 ENCOUNTER — Encounter (HOSPITAL_COMMUNITY)

## 2024-04-12 NOTE — Progress Notes (Incomplete)
 Pulmonary Hypertension Clinic Note   Primary Care: Campbell Reynolds, NP Primary HF MD: Dr Cherrie   HPI: [name] [name] is a 63 y.o. 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   In 09/2015 diagnosed with saddle PE in [place] and started on anticoagulation. She was very briefly on warfarin but quickly switched to Xarelto .   She was referred to [facility] in 4/17. Echo showed LVEF 55-60%, normal LA, normal RV size, RVSP estimated as 70 mm Hg. She further was referred to [facility] and saw Dr. Avram on 03/14/2016 where patient had several studies including V/Q scan which found high prob for PE; studies c/w CTEPH. It was felt that her chronic chest pain was caused by chronic PE. RHC/LHC/Pulm Angiogram 03/2016 confirmed diagnosis of CTEPH. She underwent pulmonary thromboembolectomy 03/31/2016 at [facility] with IVC filter placement. Pre-op cath with no CAD.   Seen by Dr.Test in 04/2016 for follow up. He felt like her heart size was decreasing. Plan was for lifelong OAC. CT angio at [facility] 05/15/16 showed  persistent but partially recanalized PE within right lower lobe pulmonary artery. No new PE identified.   She was admitted to [facility] 10/17 for continued chest pain and parasthesias. V/Q scan 06/27/16 showed prominent ventilation perfusion mismatch in right c/w chronic PE. Head CT negative. She was continued on Xarelto .  Admitted to [facility] for suicidal ideation. She was then sent to [facility] for additional treatment.   RHC in 10/20 demonstrated mild PAH (50/16 (28)) with high output (8.1l/min) and normal PVR (2.0 WU). Not candidate for additional selective pulmonary artery vasodialtors   Sleep study 9/22: AHI 4.4  In 11/21 had worsening symptoms. RHC unchanged PA 52/18 (30). PVR 2.8  RHC 8/23 showed mild PAH and  normal CO. Largely unchanged from previous.   S/p laparoscopic bilateral inguinal hernia and periumbilical hernia repair with Dr. Sheldon on 07/09/23.   Today she returns for AHF follow up. Overall feeling ok, has more good days than bad days. Typically has bad days when its too hot outside and she tries to do garden work. Denies palpitations, CP, or PND/Orthopnea. Occasional LLE edema, resolves with elevation. SOB with exertional activity. Has been having dizziness, typically associated with bending over or brisk movements, now on meclizine  PRN. Appetite ok. No fever or chills. Weight at home 280 pounds. Taking all medications. Denies ETOH, tobacco or drug use.  Wears oxygen  at night.   Cardiac Studies - RHC 8/23): RA = 10, RV = 55/15, PA = 51/22 (32), PCW 14. Fick CO/CI 7.0/3.0, PVR = 2.6, FA sat = 98%, PA sat = 68%, 71%, SVC sat = 62%, PAPi = 2.9. Mild PAH with normal cardiac output  - RHC (11/21): RA = 8, RV = 49/10, PA = 52/18 (30), PCW = 12, Fick CO/CI 6.5/2.7, PVR = 2.8 WU, FA sat = 98%, PA sat = 67%, 68%, PaPi = 3.6  - RHC (10/20) RA = 7, RV = 50/12, PA = 50/16 (28), PCW = 11, Fick CO/CI = 8.1/3.4, PVR = 2.0 WU, FA sat = 99%, PA sat = 72%, 77%  - Echo (8/18): EF 55-60%, RV normal, RVSP 20 mmHg  - RHC (2/18) RA = 8, RV = 64/13, PA = 65/19 (35), PCW = 9, Fick CO/CI 7.5/3.1, PVR = 3.5 WU, Ao sat = 96%, PA sat =  68%,70%, SVC sat = 71%.  Mild residual PAH in the setting of CTEPH s/p pulmonary thrombolectomy. Normal cardiac output. Normal left-sided filling pressures  - V/Q scan (06/27/16): Prominent ventilation perfusion mismatch noted on the right. Finding suggests high probability right-sided pulmonary embolus in this patient with known right-sided pulmonary embolus.   - CT angio (05/15/16): Persistent but partially recanalized pulmonary embolus within the right lower lobe pulmonary artery. No new pulmonary embolism is identified.Minimal bibasilar atelectatic changes stable from the prior  exam.  - Echo (04/05/16): normal LV function, mild LVH. Severe RV sysstolic dysfunction. Trivial PR, mild MR, trivial pericardial effusion  - RHC at [facility] (03/25/2016): RA 11, RVSP 68, PA mean 40, PCW 14, PVR 4.7 WU, CI 2.3  - Echo (4/17): EF 55-60%,  - Pulmonary arteries: PA peak pressure: 70 mm Hg (S). - Pericardium, extracardiac: A trivial pericardial effusion was   identified.   Past Medical History:  Diagnosis Date   Anemia    Anxiety    Arthritis    CHF exacerbation (HCC) 08/21/2017   Depression    Difficult intubation    See [facility] anesthesia records in Care Everywhere   Diverticulitis    Dyspnea    with exertion   Family history of adverse reaction to anesthesia     My mother stopped breathing during procedure   GERD (gastroesophageal reflux disease)    PMH; resolved   Hyperlipidemia    Hypertension    Obesity    Plantar fasciitis, left    Pneumonia    Port-A-Cath in place    uses d/t being a Hard Stick   Pre-diabetes    Pulmonary embolism (HCC)    Pulmonary hypertension (HCC)    Sleep apnea    wears CPAP # 9   Wears glasses    Current Outpatient Medications  Medication Sig Dispense Refill   albuterol  (VENTOLIN  HFA) 108 (90 Base) MCG/ACT inhaler Inhale 2 puffs into the lungs every 6 (six) hours as needed for wheezing or shortness of breath.     atorvastatin  (LIPITOR) 20 MG tablet Take 1 tablet (20 mg total) by mouth daily at 6 PM. 90 tablet 1   Azelastine  HCl 137 MCG/SPRAY SOLN Place 1 spray into both nostrils daily as needed (allergies).     cetirizine (ZYRTEC) 10 MG tablet Take 10 mg by mouth daily.     docusate sodium  (COLACE) 100 MG capsule Take 1 capsule (100 mg total) by mouth every 12 (twelve) hours. 30 capsule 0   lanolin-mineral oil (BABY OIL) OIL 1-2 drops in the morning and at bedtime. Into ears as needed     omeprazole  (PRILOSEC) 40 MG capsule Take 40 mg by mouth 2 (two) times daily.     ondansetron  (ZOFRAN ) 4 MG tablet Take 4 mg by mouth every 8  (eight) hours as needed for nausea/vomiting.     OXYGEN  Inhale 2 L into the lungs See admin instructions. At bedtime and during the day when resting     polyethylene glycol (MIRALAX  / GLYCOLAX ) 17 g packet Take 17 g by mouth in the morning.     potassium chloride  (KLOR-CON ) 10 MEQ tablet Take 2 tablets (20 mEq total) by mouth every morning. 60 tablet 5   QUEtiapine  (SEROQUEL ) 300 MG tablet Take 300 mg by mouth at bedtime as needed (sleep).     Riociguat  (ADEMPAS ) 2 MG TABS Take 2 mg by mouth 3 (three) times daily. 90 tablet 11   rivaroxaban  (XARELTO ) 20 MG TABS tablet Take 1  tablet (20 mg total) by mouth daily with supper. 90 tablet 3   Semaglutide , 2 MG/DOSE, (OZEMPIC , 2 MG/DOSE,) 8 MG/3ML SOPN Inject 2 mg into the skin once a week.     spironolactone  (ALDACTONE ) 25 MG tablet TAKE 1/2 (ONE-HALF) TABLET BY MOUTH ONCE DAILY . APPOINTMENT REQUIRED FOR FUTURE REFILLS 15 tablet 0   torsemide  (DEMADEX ) 20 MG tablet Take 1 tablet (20 mg total) by mouth daily. You may take 1 additional tablet as needed for swelling or a weight gain of 3 pounds or more in 24 hours or 5 pounds in one week. 45 tablet 5   cyclobenzaprine  (FLEXERIL ) 10 MG tablet Take 1 tablet (10 mg total) by mouth 3 (three) times daily as needed for muscle spasms. (Patient not taking: Reported on 04/14/2024) 30 tablet 2   gabapentin  (NEURONTIN ) 300 MG capsule Take 1 capsule (300 mg total) by mouth 3 (three) times daily. Increase to 4x/day as needed (Patient not taking: Reported on 04/14/2024) 60 capsule 2   HYDROcodone -acetaminophen  (NORCO) 10-325 MG tablet Take 1 tablet by mouth every 6 (six) hours as needed for moderate pain (pain score 4-6). (Patient not taking: Reported on 04/14/2024) 30 tablet 0   No current facility-administered medications for this encounter.   Allergies  Allergen Reactions   Neomycin Itching, Swelling and Other (See Comments)    Topical--burning/itching/swelling    Social History   Socioeconomic History   Marital  status: Married    Spouse name: Not on file   Number of children: Not on file   Years of education: Not on file   Highest education level: 12th grade  Occupational History   Occupation: Disability  Tobacco Use   Smoking status: Never   Smokeless tobacco: Never  Vaping Use   Vaping status: Never Used  Substance and Sexual Activity   Alcohol use: No   Drug use: No   Sexual activity: Yes    Partners: Male    Birth control/protection: Surgical  Other Topics Concern   Not on file  Social History Narrative   Not on file   Social Drivers of Health   Financial Resource Strain: High Risk (10/25/2022)   Overall Financial Resource Strain (CARDIA)    Difficulty of Paying Living Expenses: Hard  Food Insecurity: Low Risk  (10/07/2023)   Received from [facility]   Hunger Vital Sign    Within the past 12 months, you worried that your food would run out before you got money to buy more: Never true    Within the past 12 months, the food you bought just didn't last and you didn't have money to get more. : Never true  Transportation Needs: No Transportation Needs (10/07/2023)   Received from [facility]    In the past 12 months, has lack of reliable transportation kept you from medical appointments, meetings, work or from getting things needed for daily living? : No  Physical Activity: Not on file  Stress: Not on file  Social Connections: Not on file  Intimate Partner Violence: Not At Risk (07/09/2023)   Humiliation, Afraid, Rape, and Kick questionnaire    Fear of Current or Ex-Partner: No    Emotionally Abused: No    Physically Abused: No    Sexually Abused: No   Family History  Problem Relation Age of Onset   Lung cancer Maternal Grandmother    Dementia Mother    Dementia Father    Anxiety disorder Sister    Bipolar disorder Sister  Drug abuse Sister    Sexual abuse Maternal Aunt    Anxiety disorder Cousin    Anxiety disorder Sister    Bipolar disorder  Sister    Drug abuse Sister    Breast cancer Neg Hx    BP (!) 143/73   Pulse 93   Ht 5' 3 (1.6 m)   Wt 131.1 kg (289 lb)   SpO2 98%   BMI 51.19 kg/m   Wt Readings from Last 3 Encounters:  04/14/24 131.1 kg (289 lb)  08/12/23 (!) 140.2 kg (309 lb)  07/09/23 136.1 kg (300 lb)   PHYSICAL EXAM: General:  well appearing.  No respiratory difficulty. Walked into clinic Neck: JVD flat.  Cor: Regular rate & rhythm. No murmurs. Lungs: clear Extremities: trace LLE  edema  Neuro: alert & oriented x 3. Affect pleasant.   EKG: NSR, RBBB (Personally reviewed)    ASSESSMENT & PLAN: 1. 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 This is stable dating back to 2017 - RHC (12/18): with very mild PAH and PVR 3.1 WU - Echo (10/19):  EF 55-60% RV not well seen but looks ok No septal flattening  - Echo (9/20): EF 65% RV dilated with normal function. Septum flat.  - RHC (10/20): mild PAH 50/16 (28) with high output (8.1l/min) and normal PVR (2.0 WU). Not candidate for additional selective pulmonary artery vasodilators. - RHC (11/21): PA 52/18 (30) PCW 12 PVR 2.8 WU - RHC (8/23): RA 10, PA 51/22 (32), PCW 14, PVR 2.6 WU - Echo 5/24 EF 60-65%, RVSFx normal, RVSP 24 mmHg  - (10/29/22) 237.7 meters; 95-99% on room air. - Volume assessment difficult given body habitus, does not appear overloaded on exam.  - NYHA II-IIIa - Continue torsemide  20 mg daily + KCL to 20 mEq daily.  - Increase spiro 12.5>25 mg daily. - Continue Adempas  2 mg tid and Xarelto . CBC today. Denies abnormal bleeding.  - CMET/BNP today - Stable, does not appear to need RHC at this time.   2. Chronic chest pain  - Has chronic PE s/p thrombectomy, LHC performed at [facility] 7/17 with no CAD.  -  Resolved  3. Palpitations - Zio (2/23): 5 runs SVT. Rare PVCS  4. Fibromyalgia/Rheumatoid Arthritis  - Follows with Rheumatology.  - No change.    5. Morbid obesity  - Body mass index is 51.19 kg/m. - On Ozempic    6. Nocturnal hypoxemia - Followed by Dr. Shellia - Sleep study with AHI 4.4. Nocturnal hypoxemia noted - Now off CPAP. Wearing O2 at night  7. Depression/anxiety - Receiving outpatient therapy.   - Per PCP   F/u w/ Dr. Cherrie in 2-3 months   Beckey LITTIE Coe AGACNP-BC   04/14/24

## 2024-04-13 ENCOUNTER — Telehealth (HOSPITAL_COMMUNITY): Payer: Self-pay

## 2024-04-13 NOTE — Telephone Encounter (Signed)
 Called to confirm/remind patient of their appointment at the [facility] on 04/14/24 3:30.   Appointment:   [x] Confirmed  [] Left mess   [] No answer/No voice mail  [] VM Full/unable to leave message  [] Phone not in service  Patient reminded to bring all medications and/or complete list.  Confirmed patient has transportation. Gave directions, instructed to utilize valet parking.

## 2024-04-14 ENCOUNTER — Encounter (HOSPITAL_COMMUNITY): Payer: Self-pay

## 2024-04-14 ENCOUNTER — Ambulatory Visit (HOSPITAL_COMMUNITY)
Admission: RE | Admit: 2024-04-14 | Discharge: 2024-04-14 | Disposition: A | Discharge status: Home / Self Care | Source: Ambulatory Visit | Attending: Internal Medicine | Admitting: Internal Medicine

## 2024-04-14 VITALS — BP 143/73 | HR 93 | Ht 63.0 in | Wt 289.0 lb

## 2024-04-14 DIAGNOSIS — I4510 Unspecified right bundle-branch block: Secondary | ICD-10-CM | POA: Insufficient documentation

## 2024-04-14 DIAGNOSIS — G4734 Idiopathic sleep related nonobstructive alveolar hypoventilation: Secondary | ICD-10-CM

## 2024-04-14 DIAGNOSIS — E6609 Other obesity due to excess calories: Secondary | ICD-10-CM | POA: Insufficient documentation

## 2024-04-14 DIAGNOSIS — I2721 Secondary pulmonary arterial hypertension: Secondary | ICD-10-CM | POA: Diagnosis not present

## 2024-04-14 DIAGNOSIS — G4736 Sleep related hypoventilation in conditions classified elsewhere: Secondary | ICD-10-CM | POA: Diagnosis not present

## 2024-04-14 DIAGNOSIS — M069 Rheumatoid arthritis, unspecified: Secondary | ICD-10-CM | POA: Insufficient documentation

## 2024-04-14 DIAGNOSIS — I2724 Chronic thromboembolic pulmonary hypertension: Secondary | ICD-10-CM | POA: Diagnosis not present

## 2024-04-14 DIAGNOSIS — G4733 Obstructive sleep apnea (adult) (pediatric): Secondary | ICD-10-CM | POA: Insufficient documentation

## 2024-04-14 DIAGNOSIS — I10 Essential (primary) hypertension: Secondary | ICD-10-CM | POA: Diagnosis present

## 2024-04-14 DIAGNOSIS — Z6841 Body Mass Index (BMI) 40.0 and over, adult: Secondary | ICD-10-CM | POA: Diagnosis not present

## 2024-04-14 DIAGNOSIS — Z7984 Long term (current) use of oral hypoglycemic drugs: Secondary | ICD-10-CM | POA: Insufficient documentation

## 2024-04-14 DIAGNOSIS — G8929 Other chronic pain: Secondary | ICD-10-CM

## 2024-04-14 DIAGNOSIS — I471 Supraventricular tachycardia, unspecified: Secondary | ICD-10-CM | POA: Diagnosis not present

## 2024-04-14 DIAGNOSIS — M797 Fibromyalgia: Secondary | ICD-10-CM | POA: Diagnosis not present

## 2024-04-14 DIAGNOSIS — R002 Palpitations: Secondary | ICD-10-CM

## 2024-04-14 DIAGNOSIS — Z86711 Personal history of pulmonary embolism: Secondary | ICD-10-CM | POA: Diagnosis not present

## 2024-04-14 DIAGNOSIS — Z7901 Long term (current) use of anticoagulants: Secondary | ICD-10-CM | POA: Insufficient documentation

## 2024-04-14 DIAGNOSIS — F418 Other specified anxiety disorders: Secondary | ICD-10-CM | POA: Diagnosis not present

## 2024-04-14 DIAGNOSIS — R079 Chest pain, unspecified: Secondary | ICD-10-CM | POA: Diagnosis not present

## 2024-04-14 DIAGNOSIS — I5032 Chronic diastolic (congestive) heart failure: Secondary | ICD-10-CM | POA: Diagnosis not present

## 2024-04-14 DIAGNOSIS — G894 Chronic pain syndrome: Secondary | ICD-10-CM | POA: Diagnosis not present

## 2024-04-14 DIAGNOSIS — F32A Depression, unspecified: Secondary | ICD-10-CM

## 2024-04-14 DIAGNOSIS — I110 Hypertensive heart disease with heart failure: Secondary | ICD-10-CM | POA: Diagnosis not present

## 2024-04-14 MED ORDER — TORSEMIDE 20 MG PO TABS: 20.0000 mg | ORAL_TABLET | Freq: Every day | ORAL | 5 refills | Status: AC

## 2024-04-14 MED ORDER — SPIRONOLACTONE 25 MG PO TABS: 25.0000 mg | ORAL_TABLET | Freq: Every day | ORAL | 6 refills | Status: DC

## 2024-04-14 NOTE — Addendum Note (Signed)
 Encounter addended by: Buell Powell HERO, RN on: 04/14/2024 4:01 PM  Actions taken: Charge Capture section accepted

## 2024-04-14 NOTE — Addendum Note (Signed)
 Encounter addended by: Hayes Beckey CROME, NP on: 04/14/2024 3:54 PM  Actions taken: Clinical Note Signed

## 2024-04-14 NOTE — Addendum Note (Signed)
 Encounter addended by: Buell Powell HERO, RN on: 04/14/2024 4:00 PM  Actions taken: Order list changed, Diagnosis association updated

## 2024-04-14 NOTE — Patient Instructions (Signed)
 Medication Changes:  INCREASE Spironolactone  to 25 mg (1 tab) Daily  Lab Work:  Labs done today, your results will be available in MyChart, we will contact you for abnormal readings.   Special Instructions // Education:  Do the following things EVERYDAY: Weigh yourself in the morning before breakfast. Write it down and keep it in a log. Take your medicines as prescribed Eat low salt foods--Limit salt (sodium) to 2000 mg per day.  Stay as active as you can everyday Limit all fluids for the day to less than 2 liters   Follow-Up in: 2-3 months (November) with Dr Cherrie, **PLEASE CALL OUR OFFICE IN SEPTEMBER TO SCHEDULE THIS APPOINTMENT    At the [facility], you and your health needs are our priority. We have a designated team specialized in the treatment of Heart Failure. This Care Team includes your primary Heart Failure Specialized Cardiologist (physician), Advanced Practice Providers (APPs- Physician Assistants and Nurse Practitioners), and Pharmacist who all work together to provide you with the care you need, when you need it.   You may see any of the following providers on your designated Care Team at your next follow up:  Dr. Toribio Cherrie Dr. Ezra Shuck Dr. Ria Commander Dr. Odis Brownie Greig Mosses, NP Caffie Shed, [place] [facility] [place], [place] Beckey Coe, NP [place] Lee, NP Tinnie Redman, PharmD   Please be sure to bring in all your medications bottles to every appointment.   Need to Contact Us :  If you have any questions or concerns before your next appointment please send us  a message through [place] or call our office at [phone].    TO LEAVE A MESSAGE FOR THE NURSE SELECT OPTION 2, PLEASE LEAVE A MESSAGE INCLUDING: YOUR NAME DATE OF BIRTH CALL BACK NUMBER REASON FOR CALL**this is important as we prioritize the call backs  YOU WILL RECEIVE A CALL BACK THE SAME DAY AS LONG AS YOU CALL BEFORE 4:00 PM

## 2024-04-22 ENCOUNTER — Telehealth (HOSPITAL_COMMUNITY): Payer: Self-pay | Admitting: Cardiology

## 2024-04-22 ENCOUNTER — Non-Acute Institutional Stay (HOSPITAL_COMMUNITY)
Admission: RE | Admit: 2024-04-22 | Discharge: 2024-04-22 | Disposition: A | Discharge status: Home / Self Care | Source: Ambulatory Visit | Attending: Internal Medicine | Admitting: Internal Medicine

## 2024-04-22 DIAGNOSIS — Z452 Encounter for adjustment and management of vascular access device: Secondary | ICD-10-CM | POA: Diagnosis present

## 2024-04-22 DIAGNOSIS — Z79899 Other long term (current) drug therapy: Secondary | ICD-10-CM | POA: Diagnosis present

## 2024-04-22 LAB — BASIC METABOLIC PANEL WITH GFR
Anion gap: 7 (ref 5–15)
BUN: 14 mg/dL (ref 8–23)
CO2: 26 mmol/L (ref 22–32)
Calcium: 9.3 mg/dL (ref 8.9–10.3)
Chloride: 106 mmol/L (ref 98–111)
Creatinine, Ser: 1 mg/dL (ref 0.44–1.00)
GFR, Estimated: >60 mL/min (ref >60)
Glucose, Bld: 97 mg/dL (ref 70–99)
Potassium: 3.7 mmol/L (ref 3.5–5.1)
Sodium: 139 mmol/L (ref 135–145)

## 2024-04-22 LAB — IRON AND TIBC
Iron: 30 ug/dL (ref 28–170)
Saturation Ratios: 10 % — ABNORMAL LOW (ref 10.4–31.8)
TIBC: 295 ug/dL (ref 250–450)
UIBC: 265 ug/dL

## 2024-04-22 LAB — VITAMIN B12: Vitamin B-12: 304 pg/mL (ref 180–914)

## 2024-04-22 LAB — TSH: TSH: 0.501 u[IU]/mL (ref 0.350–4.500)

## 2024-04-22 LAB — SEDIMENTATION RATE: Sed Rate: 11 mm/h (ref 0–22)

## 2024-04-22 LAB — FERRITIN: Ferritin: 46 ng/mL (ref 11–307)

## 2024-04-22 LAB — T4, FREE: Free T4: 0.94 ng/dL (ref 0.61–1.12)

## 2024-04-22 LAB — URIC ACID: Uric Acid, Serum: 8 mg/dL — ABNORMAL HIGH (ref 2.5–7.1)

## 2024-04-22 MED ORDER — SODIUM CHLORIDE 0.9% FLUSH
10.0000 mL | INTRAVENOUS | Status: AC | PRN
  Administered 2024-04-22: 10 mL

## 2024-04-22 MED ORDER — HEPARIN SOD (PORK) LOCK FLUSH 100 UNIT/ML IV SOLN
500.0000 [IU] | INTRAVENOUS | Status: AC | PRN
  Administered 2024-04-22: 500 [IU]

## 2024-04-22 MED ORDER — SPIRONOLACTONE 25 MG PO TABS
12.5000 mg | ORAL_TABLET | Freq: Every day | ORAL | 6 refills | Status: AC
Start: 2024-04-22 — End: ?

## 2024-04-22 NOTE — Telephone Encounter (Signed)
 Patient called with increased concerns of dizziness. Reports dizziness has been going on for some time and discussed at recent OV, however she has not seen any improvement  -reports b/p is normal (seen at cancer center 8/14) -no recent medication changes  -reports medication compliance -no weights to report (most recent weight 288-normal weight 294) --reports adequate hydration  -reports no relief with meclizine    Also note cancer was unable to see order for AHF ordered labs

## 2024-04-22 NOTE — Progress Notes (Signed)
 PATIENT CARE CENTER NOTE:   Diagnosis: Z79.899   Provider: Damian Christians FNP   Procedure: Port-a-cath flush and lab draw ( #4 of 11)     Note: Patient's PAC accessed using sterile technique.  Brisk blood return noted. Labs drawn from [facility] per order. Port flushed with 10 cc 0.9% NS and 500 units Heparin . PAC de-accessed and site covered with band aid. Patient tolerated well. Patient declined printed AVS. Instructed patient to schedule monthly visit at front desk prior to leaving, verbalized understanding.  Alert, oriented and ambulatory with cane at discharge.

## 2024-04-22 NOTE — Telephone Encounter (Signed)
 Patient aware and voiced understanding Reports she feels like this medication has nothing to do with symptoms but will continue to deal with it

## 2024-04-23 LAB — HIGH SENSITIVITY CRP: CRP, High Sensitivity: 8.3 mg/L — ABNORMAL HIGH (ref 0.00–3.00)

## 2024-04-23 LAB — RHEUMATOID FACTOR: Rheumatoid fact SerPl-aCnc: <10 [IU]/mL (ref <14.0)

## 2024-04-24 LAB — ERYTHROPOIETIN: Erythropoietin: 22.4 m[IU]/mL — ABNORMAL HIGH (ref 2.6–18.5)

## 2024-04-26 ENCOUNTER — Other Ambulatory Visit: Payer: Self-pay

## 2024-04-26 ENCOUNTER — Other Ambulatory Visit: Payer: Self-pay | Admitting: Student

## 2024-04-26 ENCOUNTER — Other Ambulatory Visit (HOSPITAL_COMMUNITY): Payer: Self-pay | Admitting: Internal Medicine

## 2024-04-26 DIAGNOSIS — Z1231 Encounter for screening mammogram for malignant neoplasm of breast: Secondary | ICD-10-CM

## 2024-04-27 ENCOUNTER — Other Ambulatory Visit (HOSPITAL_COMMUNITY): Payer: Self-pay

## 2024-04-27 MED ORDER — RIVAROXABAN 20 MG PO TABS
20.0000 mg | ORAL_TABLET | Freq: Every day | ORAL | 3 refills | Status: DC
  Filled 2024-04-27: qty 90, 90d supply, fill #0
  Filled 2024-08-21 – 2024-08-23 (×2): qty 90, 90d supply, fill #1

## 2024-04-28 ENCOUNTER — Other Ambulatory Visit (HOSPITAL_COMMUNITY): Payer: Self-pay

## 2024-05-13 ENCOUNTER — Ambulatory Visit

## 2024-05-24 ENCOUNTER — Non-Acute Institutional Stay (HOSPITAL_COMMUNITY): Attending: Internal Medicine

## 2024-06-16 ENCOUNTER — Encounter (HOSPITAL_COMMUNITY)

## 2024-06-25 ENCOUNTER — Non-Acute Institutional Stay (HOSPITAL_COMMUNITY)
Admission: RE | Admit: 2024-06-25 | Discharge: 2024-06-25 | Disposition: A | Discharge status: Home / Self Care | Source: Ambulatory Visit | Attending: Internal Medicine | Admitting: Internal Medicine

## 2024-06-25 DIAGNOSIS — Z452 Encounter for adjustment and management of vascular access device: Secondary | ICD-10-CM | POA: Insufficient documentation

## 2024-06-25 MED ORDER — HEPARIN SOD (PORK) LOCK FLUSH 100 UNIT/ML IV SOLN
500.0000 [IU] | INTRAVENOUS | Status: AC | PRN
  Administered 2024-06-25: 500 [IU]
  Filled 2024-06-25: qty 5

## 2024-06-25 MED ORDER — SODIUM CHLORIDE 0.9% FLUSH
10.0000 mL | INTRAVENOUS | Status: AC | PRN
  Administered 2024-06-25: 10 mL

## 2024-06-25 NOTE — Progress Notes (Signed)
 PATIENT CARE CENTER NOTE:   Diagnosis: Z79.899   Provider: Damian Christians FNP   Procedure: Port-a-cath flush ( #5 of 11)     Note: Patient's PAC accessed using sterile technique.  Brisk blood return noted. Port flushed with 10 cc 0.9% NS and 500 units Heparin . PAC de-accessed and site covered with band aid. Patient tolerated well. Patient declined printed AVS. Instructed patient to schedule monthly visit at front desk prior to leaving, verbalized understanding.  Alert, oriented and ambulatory with cane at discharge.

## 2024-07-02 ENCOUNTER — Ambulatory Visit
Admission: RE | Admit: 2024-07-02 | Discharge: 2024-07-02 | Disposition: A | Discharge status: Home / Self Care | Source: Ambulatory Visit | Attending: Student | Admitting: Student

## 2024-07-02 DIAGNOSIS — Z1231 Encounter for screening mammogram for malignant neoplasm of breast: Secondary | ICD-10-CM

## 2024-07-26 ENCOUNTER — Encounter (HOSPITAL_COMMUNITY)

## 2024-07-27 ENCOUNTER — Other Ambulatory Visit (HOSPITAL_COMMUNITY): Payer: Self-pay | Admitting: Internal Medicine

## 2024-08-02 ENCOUNTER — Ambulatory Visit (HOSPITAL_COMMUNITY)

## 2024-08-13 ENCOUNTER — Ambulatory Visit (HOSPITAL_COMMUNITY)

## 2024-08-21 ENCOUNTER — Other Ambulatory Visit (HOSPITAL_COMMUNITY): Payer: Self-pay

## 2024-08-23 ENCOUNTER — Ambulatory Visit (HOSPITAL_COMMUNITY)

## 2024-08-23 ENCOUNTER — Other Ambulatory Visit (HOSPITAL_COMMUNITY): Payer: Self-pay

## 2024-08-26 ENCOUNTER — Ambulatory Visit (HOSPITAL_COMMUNITY)
Admission: RE | Admit: 2024-08-26 | Discharge: 2024-08-26 | Disposition: A | Discharge status: Home / Self Care | Source: Ambulatory Visit | Attending: Internal Medicine | Admitting: Internal Medicine

## 2024-08-26 ENCOUNTER — Other Ambulatory Visit (HOSPITAL_COMMUNITY): Payer: Self-pay

## 2024-08-26 ENCOUNTER — Encounter (HOSPITAL_COMMUNITY): Payer: Self-pay | Admitting: Internal Medicine

## 2024-08-26 VITALS — BP 120/80 | HR 78 | Wt 288.0 lb

## 2024-08-26 DIAGNOSIS — G894 Chronic pain syndrome: Secondary | ICD-10-CM | POA: Diagnosis not present

## 2024-08-26 DIAGNOSIS — I110 Hypertensive heart disease with heart failure: Secondary | ICD-10-CM | POA: Diagnosis present

## 2024-08-26 DIAGNOSIS — F418 Other specified anxiety disorders: Secondary | ICD-10-CM | POA: Insufficient documentation

## 2024-08-26 DIAGNOSIS — I2721 Secondary pulmonary arterial hypertension: Secondary | ICD-10-CM | POA: Diagnosis not present

## 2024-08-26 DIAGNOSIS — Z6841 Body Mass Index (BMI) 40.0 and over, adult: Secondary | ICD-10-CM | POA: Insufficient documentation

## 2024-08-26 DIAGNOSIS — Z7901 Long term (current) use of anticoagulants: Secondary | ICD-10-CM | POA: Insufficient documentation

## 2024-08-26 DIAGNOSIS — M069 Rheumatoid arthritis, unspecified: Secondary | ICD-10-CM | POA: Diagnosis not present

## 2024-08-26 DIAGNOSIS — G4733 Obstructive sleep apnea (adult) (pediatric): Secondary | ICD-10-CM | POA: Insufficient documentation

## 2024-08-26 DIAGNOSIS — I2724 Chronic thromboembolic pulmonary hypertension: Secondary | ICD-10-CM | POA: Insufficient documentation

## 2024-08-26 DIAGNOSIS — Z79899 Other long term (current) drug therapy: Secondary | ICD-10-CM | POA: Insufficient documentation

## 2024-08-26 DIAGNOSIS — R002 Palpitations: Secondary | ICD-10-CM | POA: Diagnosis not present

## 2024-08-26 DIAGNOSIS — Z9981 Dependence on supplemental oxygen: Secondary | ICD-10-CM | POA: Insufficient documentation

## 2024-08-26 DIAGNOSIS — I5032 Chronic diastolic (congestive) heart failure: Secondary | ICD-10-CM | POA: Diagnosis not present

## 2024-08-26 DIAGNOSIS — M797 Fibromyalgia: Secondary | ICD-10-CM | POA: Insufficient documentation

## 2024-08-26 NOTE — H&P (View-Only) (Signed)
 "  Pulmonary Hypertension Clinic Note   Primary Care: Campbell Reynolds, NP Primary HF MD: Dr Cherrie   HPI: [name] [name] is a 63 y.o. 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   In 09/2015 diagnosed with saddle PE in [employer] and started on anticoagulation. She was very briefly on warfarin but quickly switched to Xarelto .   She was referred to [facility] Cardiology in 4/17. Echo showed LVEF 55-60%, normal LA, normal RV size, RVSP estimated as 70 mm Hg. She further was referred to [facility] and saw Dr. Avram on 03/14/2016 where patient had several studies including V/Q scan which found high prob for PE; studies c/w CTEPH. It was felt that her chronic chest pain was caused by chronic PE. RHC/LHC/Pulm Angiogram 03/2016 confirmed diagnosis of CTEPH. She underwent pulmonary thromboembolectomy 03/31/2016 at [facility] with IVC filter placement. Pre-op cath with no CAD.   Seen by Dr.Test in 04/2016 for follow up. He felt like her heart size was decreasing. Plan was for lifelong OAC. CT angio at [facility] 05/15/16 showed  persistent but partially recanalized PE within right lower lobe pulmonary artery. No new PE identified.   She was admitted to [facility] 10/17 for continued chest pain and parasthesias. V/Q scan 06/27/16 showed prominent ventilation perfusion mismatch in right c/w chronic PE. Head CT negative. She was continued on Xarelto .  Admitted to Behavioral Health for suicidal ideation. She was then sent to [facility] for additional treatment.   RHC in 10/20 demonstrated mild PAH (50/16 (28)) with high output (8.1l/min) and normal PVR (2.0 WU). Not candidate for additional selective pulmonary artery vasodialtors   Sleep study 9/22: AHI 4.4  In 11/21 had worsening symptoms. RHC unchanged PA 52/18 (30). PVR 2.8  RHC 8/23 showed mild PAH and  normal CO. Largely unchanged from previous.   S/p laparoscopic bilateral inguinal hernia and periumbilical hernia repair with Dr. Sheldon on 07/09/23.   Today she returns for AHF follow up. Feels ok. But feels that she is more tired and SOB with daily activities. Edema increased.    Cardiac Studies - RHC 8/23): RA = 10, RV = 55/15, PA = 51/22 (32), PCW 14. Fick CO/CI 7.0/3.0, PVR = 2.6, FA sat = 98%, PA sat = 68%, 71%, SVC sat = 62%, PAPi = 2.9. Mild PAH with normal cardiac output  - RHC (11/21): RA = 8, RV = 49/10, PA = 52/18 (30), PCW = 12, Fick CO/CI 6.5/2.7, PVR = 2.8 WU, FA sat = 98%, PA sat = 67%, 68%, PaPi = 3.6  - RHC (10/20) RA = 7, RV = 50/12, PA = 50/16 (28), PCW = 11, Fick CO/CI = 8.1/3.4, PVR = 2.0 WU, FA sat = 99%, PA sat = 72%, 77%  - Echo (8/18): EF 55-60%, RV normal, RVSP 20 mmHg  - RHC (2/18) RA = 8, RV = 64/13, PA = 65/19 (35), PCW = 9, Fick CO/CI 7.5/3.1, PVR = 3.5 WU, Ao sat = 96%, PA sat = 68%,70%, SVC sat = 71%.  Mild residual PAH in the setting of CTEPH s/p pulmonary thrombolectomy. Normal cardiac output. Normal left-sided filling pressures  - V/Q scan (06/27/16): Prominent ventilation perfusion mismatch noted on the right. Finding suggests high probability right-sided pulmonary embolus in this patient with known right-sided pulmonary embolus.   - CT angio (05/15/16): Persistent but partially recanalized pulmonary  embolus within the right lower lobe pulmonary artery. No new pulmonary embolism is identified.Minimal bibasilar atelectatic changes stable from the prior exam.  - RHC at [facility] (03/25/2016): RA 11, RVSP 68, PA mean 40, PCW 14, PVR 4.7 WU, CI 2.3  - Echo (4/17): EF 55-60%,  - Pulmonary arteries: PA peak pressure: 70 mm Hg (S). - Pericardium, extracardiac: A trivial pericardial effusion was   identified.   Past Medical History:  Diagnosis Date   Anemia    Anxiety    Arthritis    CHF exacerbation (HCC) 08/21/2017   Depression    Difficult intubation     See [facility] anesthesia records in Care Everywhere   Diverticulitis    Dyspnea    with exertion   Family history of adverse reaction to anesthesia     My mother stopped breathing during procedure   GERD (gastroesophageal reflux disease)    PMH; resolved   Hyperlipidemia    Hypertension    Obesity    Plantar fasciitis, left    Pneumonia    Port-A-Cath in place    uses d/t being a Hard Stick   Pre-diabetes    Pulmonary embolism (HCC)    Pulmonary hypertension (HCC)    Sleep apnea    wears CPAP # 9   Wears glasses    Current Outpatient Medications  Medication Sig Dispense Refill   ADEMPAS  2 MG TABS TAKE 1 TABLET THREE TIMES A DAY 90 tablet 11   albuterol  (VENTOLIN  HFA) 108 (90 Base) MCG/ACT inhaler Inhale 2 puffs into the lungs every 6 (six) hours as needed for wheezing or shortness of breath.     atorvastatin  (LIPITOR) 20 MG tablet Take 1 tablet (20 mg total) by mouth daily at 6 PM. 90 tablet 1   Azelastine  HCl 137 MCG/SPRAY SOLN Place 1 spray into both nostrils daily as needed (allergies).     cetirizine (ZYRTEC) 10 MG tablet Take 10 mg by mouth daily.     docusate sodium  (COLACE) 100 MG capsule Take 1 capsule (100 mg total) by mouth every 12 (twelve) hours. 30 capsule 0   omeprazole  (PRILOSEC) 40 MG capsule Take 40 mg by mouth 2 (two) times daily.     ondansetron  (ZOFRAN ) 4 MG tablet Take 4 mg by mouth every 8 (eight) hours as needed for nausea/vomiting.     OXYGEN  Inhale 2 L into the lungs See admin instructions. At bedtime and during the day when resting     Polyethylene Glycol 3350  (MIRALAX  PO) Take 1 Capful by mouth daily.     potassium chloride  (KLOR-CON ) 10 MEQ tablet Take 2 tablets (20 mEq total) by mouth every morning. (Patient taking differently: Take 20 mEq by mouth daily.) 60 tablet 5   rivaroxaban  (XARELTO ) 20 MG TABS tablet Take 20 mg by mouth daily with supper.     Semaglutide , 2 MG/DOSE, (OZEMPIC , 2 MG/DOSE,) 8 MG/3ML SOPN Inject 2 mg into the skin once a week.      spironolactone  (ALDACTONE ) 25 MG tablet Take 0.5 tablets (12.5 mg total) by mouth daily. 15 tablet 6   torsemide  (DEMADEX ) 20 MG tablet Take 1 tablet (20 mg total) by mouth daily. You may take 1 additional tablet as needed for swelling or a weight gain of 3 pounds or more in 24 hours or 5 pounds in one week. 45 tablet 5   meclizine  (ANTIVERT ) 25 MG tablet Take 25 mg by mouth 3 (three) times daily as needed for dizziness. (Patient not taking: Reported on 08/26/2024)  No current facility-administered medications for this encounter.   Allergies  Allergen Reactions   Neomycin Itching, Swelling and Other (See Comments)    Topical--burning/itching/swelling    Social History   Socioeconomic History   Marital status: Married    Spouse name: Not on file   Number of children: Not on file   Years of education: Not on file   Highest education level: 12th grade  Occupational History   Occupation: Disability  Tobacco Use   Smoking status: Never   Smokeless tobacco: Never  Vaping Use   Vaping status: Never Used  Substance and Sexual Activity   Alcohol use: No   Drug use: No   Sexual activity: Yes    Partners: Male    Birth control/protection: Surgical  Other Topics Concern   Not on file  Social History Narrative   Not on file   Social Drivers of Health   Tobacco Use: Low Risk (08/26/2024)   Patient History    Smoking Tobacco Use: Never    Smokeless Tobacco Use: Never    Passive Exposure: Not on file  Financial Resource Strain: High Risk (10/25/2022)   Overall Financial Resource Strain (CARDIA)    Difficulty of Paying Living Expenses: Hard  Food Insecurity: Low Risk (10/07/2023)   Received from [facility]   Epic    Within the past 12 months, you worried that your food would run out before you got money to buy more: Never true    Within the past 12 months, the food you bought just didn't last and you didn't have money to get more. : Never true  Transportation Needs: No  Transportation Needs (10/07/2023)   Received from [employer]    In the past 12 months, has lack of reliable transportation kept you from medical appointments, meetings, work or from getting things needed for daily living? : No  Physical Activity: Not on file  Stress: Not on file  Social Connections: Not on file  Intimate Partner Violence: Not At Risk (07/09/2023)   Humiliation, Afraid, Rape, and Kick questionnaire    Fear of Current or Ex-Partner: No    Emotionally Abused: No    Physically Abused: No    Sexually Abused: No  Depression (PHQ2-9): Medium Risk (11/28/2021)   Depression (PHQ2-9)    PHQ-2 Score: 8  Alcohol Screen: Not on file  Housing: Low Risk (10/07/2023)   Received from [facility]   Epic    What is your living situation today?: I have a steady place to live    Think about the place you live. Do you have problems with any of the following? Choose all that apply:: Not on file  Utilities: Low Risk (10/07/2023)   Received from [facility]   Utilities    In the past 12 months has the electric, gas, oil, or water  company threatened to shut off services in your home? : No  Health Literacy: Not on file   Family History  Problem Relation Age of Onset   Lung cancer Maternal Grandmother    Dementia Mother    Dementia Father    Anxiety disorder Sister    Bipolar disorder Sister    Drug abuse Sister    Sexual abuse Maternal Aunt    Anxiety disorder Cousin    Anxiety disorder Sister    Bipolar disorder Sister    Drug abuse Sister    Breast cancer Neg Hx    BP 120/80   Pulse 78  Wt 130.6 kg (288 lb)   SpO2 97%   BMI 51.02 kg/m   Wt Readings from Last 3 Encounters:  08/26/24 130.6 kg (288 lb)  04/14/24 131.1 kg (289 lb)  08/12/23 (!) 140.2 kg (309 lb)   PHYSICAL EXAM: General:  well appearing.  No respiratory difficulty. Walked into clinic Neck: JVD flat.  Cor: Regular rate & rhythm. No murmurs. Lungs: clear Extremities: trace LLE  edema   Neuro: alert & oriented x 3. Affect pleasant.   EKG: n/a  ASSESSMENT & PLAN: 1. 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 This is stable dating back to 2017 - RHC (12/18): with very mild PAH and PVR 3.1 WU - Echo (10/19):  EF 55-60% RV not well seen but looks ok No septal flattening  - Echo (9/20): EF 65% RV dilated with normal function. Septum flat.  - RHC (10/20): mild PAH 50/16 (28) with high output (8.1l/min) and normal PVR (2.0 WU). Not candidate for additional selective pulmonary artery vasodilators. - RHC (11/21): PA 52/18 (30) PCW 12 PVR 2.8 WU - RHC (8/23): RA 10, PA 51/22 (32), PCW 14, PVR 2.6 WU - Echo 5/24 EF 60-65%, RVSFx normal, RVSP 24 mmHg  - (10/29/22) 237.7 meters; 95-99% on room air. - Worse today NYHA III Volume ok  - Continue torsemide  20 mg daily + KCL to 20 mEq daily.  - Continue spiro 25mg  daily - Continue Adempas  2 mg tid and Xarelto .  - Given worsening symptoms despite weight loss will repeat echo and RHC with eye toward increasing PAH therapies  2. Chronic chest pain  - Has chronic PE s/p thrombectomy, LHC performed at [facility] 7/17 with no CAD.  - Resolved  3. Palpitations - Zio (2/23): 5 runs SVT. Rare PVCS - Resolved  4. Fibromyalgia/Rheumatoid Arthritis  - Follows with Rheumatology.  - No change  5. Morbid obesity  - Body mass index is 51.02 kg/m. - Continue Ozempic   6. Nocturnal hypoxemia - Followed by Dr. Shellia - Sleep study with AHI 4.4. Nocturnal hypoxemia noted - Now off CPAP. Wearing O2 at night  7. Depression/anxiety - Receiving outpatient therapy.   - Per PCP   Toribio Fuel, MD  2:46 PM  "

## 2024-08-26 NOTE — Progress Notes (Signed)
 Pulmonary Hypertension Clinic Note   Primary Care: Campbell Reynolds, NP Primary HF MD: Dr Cherrie   HPI: [name] [name] is a 63 y.o. 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   In 09/2015 diagnosed with saddle PE in [place] and started on anticoagulation. She was very briefly on warfarin but quickly switched to Xarelto .   She was referred to [facility]iology in 4/17. Echo showed LVEF 55-60%, normal LA, normal RV size, RVSP estimated as 70 mm Hg. She further was referred to [facility] and saw Dr. Avram on 03/14/2016 where patient had several studies including V/Q scan which found high prob for PE; studies c/w CTEPH. It was felt that her chronic chest pain was caused by chronic PE. RHC/LHC/Pulm Angiogram 03/2016 confirmed diagnosis of CTEPH. She underwent pulmonary thromboembolectomy 03/31/2016 at [facility] with IVC filter placement. Pre-op cath with no CAD.   Seen by Dr.Test in 04/2016 for follow up. He felt like her heart size was decreasing. Plan was for lifelong OAC. CT angio at [facility] 05/15/16 showed  persistent but partially recanalized PE within right lower lobe pulmonary artery. No new PE identified.   She was admitted to [facility] 10/17 for continued chest pain and parasthesias. V/Q scan 06/27/16 showed prominent ventilation perfusion mismatch in right c/w chronic PE. Head CT negative. She was continued on Xarelto .  Admitted to Behavioral Health for suicidal ideation. She was then sent to [facility] for additional treatment.   RHC in 10/20 demonstrated mild PAH (50/16 (28)) with high output (8.1l/min) and normal PVR (2.0 WU). Not candidate for additional selective pulmonary artery vasodialtors   Sleep study 9/22: AHI 4.4  In 11/21 had worsening symptoms. RHC unchanged PA 52/18 (30). PVR 2.8  RHC 8/23 showed mild PAH and  normal CO. Largely unchanged from previous.   S/p laparoscopic bilateral inguinal hernia and periumbilical hernia repair with Dr. Sheldon on 07/09/23.   Today she returns for AHF follow up. Feels ok. But feels that she is more tired and SOB with daily activities. Edema increased.    Cardiac Studies - RHC 8/23): RA = 10, RV = 55/15, PA = 51/22 (32), PCW 14. Fick CO/CI 7.0/3.0, PVR = 2.6, FA sat = 98%, PA sat = 68%, 71%, SVC sat = 62%, PAPi = 2.9. Mild PAH with normal cardiac output  - RHC (11/21): RA = 8, RV = 49/10, PA = 52/18 (30), PCW = 12, Fick CO/CI 6.5/2.7, PVR = 2.8 WU, FA sat = 98%, PA sat = 67%, 68%, PaPi = 3.6  - RHC (10/20) RA = 7, RV = 50/12, PA = 50/16 (28), PCW = 11, Fick CO/CI = 8.1/3.4, PVR = 2.0 WU, FA sat = 99%, PA sat = 72%, 77%  - Echo (8/18): EF 55-60%, RV normal, RVSP 20 mmHg  - RHC (2/18) RA = 8, RV = 64/13, PA = 65/19 (35), PCW = 9, Fick CO/CI 7.5/3.1, PVR = 3.5 WU, Ao sat = 96%, PA sat = 68%,70%, SVC sat = 71%.  Mild residual PAH in the setting of CTEPH s/p pulmonary thrombolectomy. Normal cardiac output. Normal left-sided filling pressures  - V/Q scan (06/27/16): Prominent ventilation perfusion mismatch noted on the right. Finding suggests high probability right-sided pulmonary embolus in this patient with known right-sided pulmonary embolus.   - CT angio (05/15/16): Persistent but partially recanalized pulmonary embolus  within the right lower lobe pulmonary artery. No new pulmonary embolism is identified.Minimal bibasilar atelectatic changes stable from the prior exam.  - RHC at [facility] (03/25/2016): RA 11, RVSP 68, PA mean 40, PCW 14, PVR 4.7 WU, CI 2.3  - Echo (4/17): EF 55-60%,  - Pulmonary arteries: PA peak pressure: 70 mm Hg (S). - Pericardium, extracardiac: A trivial pericardial effusion was   identified.   Past Medical History:  Diagnosis Date   Anemia    Anxiety    Arthritis    CHF exacerbation (HCC) 08/21/2017   Depression    Difficult intubation     See [facility] anesthesia records in Care Everywhere   Diverticulitis    Dyspnea    with exertion   Family history of adverse reaction to anesthesia     My mother stopped breathing during procedure   GERD (gastroesophageal reflux disease)    PMH; resolved   Hyperlipidemia    Hypertension    Obesity    Plantar fasciitis, left    Pneumonia    Port-A-Cath in place    uses d/t being a Hard Stick   Pre-diabetes    Pulmonary embolism (HCC)    Pulmonary hypertension (HCC)    Sleep apnea    wears CPAP # 9   Wears glasses    Current Outpatient Medications  Medication Sig Dispense Refill   ADEMPAS  2 MG TABS TAKE 1 TABLET THREE TIMES A DAY 90 tablet 11   albuterol  (VENTOLIN  HFA) 108 (90 Base) MCG/ACT inhaler Inhale 2 puffs into the lungs every 6 (six) hours as needed for wheezing or shortness of breath.     atorvastatin  (LIPITOR) 20 MG tablet Take 1 tablet (20 mg total) by mouth daily at 6 PM. 90 tablet 1   Azelastine  HCl 137 MCG/SPRAY SOLN Place 1 spray into both nostrils daily as needed (allergies).     cetirizine (ZYRTEC) 10 MG tablet Take 10 mg by mouth daily.     docusate sodium  (COLACE) 100 MG capsule Take 1 capsule (100 mg total) by mouth every 12 (twelve) hours. 30 capsule 0   omeprazole  (PRILOSEC) 40 MG capsule Take 40 mg by mouth 2 (two) times daily.     ondansetron  (ZOFRAN ) 4 MG tablet Take 4 mg by mouth every 8 (eight) hours as needed for nausea/vomiting.     OXYGEN  Inhale 2 L into the lungs See admin instructions. At bedtime and during the day when resting     Polyethylene Glycol 3350  (MIRALAX  PO) Take 1 Capful by mouth daily.     potassium chloride  (KLOR-CON ) 10 MEQ tablet Take 2 tablets (20 mEq total) by mouth every morning. (Patient taking differently: Take 20 mEq by mouth daily.) 60 tablet 5   rivaroxaban  (XARELTO ) 20 MG TABS tablet Take 20 mg by mouth daily with supper.     Semaglutide , 2 MG/DOSE, (OZEMPIC , 2 MG/DOSE,) 8 MG/3ML SOPN Inject 2 mg into the skin once a week.      spironolactone  (ALDACTONE ) 25 MG tablet Take 0.5 tablets (12.5 mg total) by mouth daily. 15 tablet 6   torsemide  (DEMADEX ) 20 MG tablet Take 1 tablet (20 mg total) by mouth daily. You may take 1 additional tablet as needed for swelling or a weight gain of 3 pounds or more in 24 hours or 5 pounds in one week. 45 tablet 5   meclizine  (ANTIVERT ) 25 MG tablet Take 25 mg by mouth 3 (three) times daily as needed for dizziness. (Patient not taking: Reported on 08/26/2024)  No current facility-administered medications for this encounter.   Allergies  Allergen Reactions   Neomycin Itching, Swelling and Other (See Comments)    Topical--burning/itching/swelling    Social History   Socioeconomic History   Marital status: Married    Spouse name: Not on file   Number of children: Not on file   Years of education: Not on file   Highest education level: 12th grade  Occupational History   Occupation: Disability  Tobacco Use   Smoking status: Never   Smokeless tobacco: Never  Vaping Use   Vaping status: Never Used  Substance and Sexual Activity   Alcohol use: No   Drug use: No   Sexual activity: Yes    Partners: Male    Birth control/protection: Surgical  Other Topics Concern   Not on file  Social History Narrative   Not on file   Social Drivers of Health   Tobacco Use: Low Risk (08/26/2024)   Patient History    Smoking Tobacco Use: Never    Smokeless Tobacco Use: Never    Passive Exposure: Not on file  Financial Resource Strain: High Risk (10/25/2022)   Overall Financial Resource Strain (CARDIA)    Difficulty of Paying Living Expenses: Hard  Food Insecurity: Low Risk (10/07/2023)   Received from [facility]   Epic    Within the past 12 months, you worried that your food would run out before you got money to buy more: Never true    Within the past 12 months, the food you bought just didn't last and you didn't have money to get more. : Never true  Transportation Needs: No  Transportation Needs (10/07/2023)   Received from [employer]    In the past 12 months, has lack of reliable transportation kept you from medical appointments, meetings, work or from getting things needed for daily living? : No  Physical Activity: Not on file  Stress: Not on file  Social Connections: Not on file  Intimate Partner Violence: Not At Risk (07/09/2023)   Humiliation, Afraid, Rape, and Kick questionnaire    Fear of Current or Ex-Partner: No    Emotionally Abused: No    Physically Abused: No    Sexually Abused: No  Depression (PHQ2-9): Medium Risk (11/28/2021)   Depression (PHQ2-9)    PHQ-2 Score: 8  Alcohol Screen: Not on file  Housing: Low Risk (10/07/2023)   Received from [facility]   Epic    What is your living situation today?: I have a steady place to live    Think about the place you live. Do you have problems with any of the following? Choose all that apply:: Not on file  Utilities: Low Risk (10/07/2023)   Received from [facility]   Utilities    In the past 12 months has the electric, gas, oil, or water company threatened to shut off services in your home? : No  Health Literacy: Not on file   Family History  Problem Relation Age of Onset   Lung cancer Maternal Grandmother    Dementia Mother    Dementia Father    Anxiety disorder Sister    Bipolar disorder Sister    Drug abuse Sister    Sexual abuse Maternal Aunt    Anxiety disorder Cousin    Anxiety disorder Sister    Bipolar disorder Sister    Drug abuse Sister    Breast cancer Neg Hx    BP 120/80   Pulse 78  Wt 130.6 kg (288 lb)   SpO2 97%   BMI 51.02 kg/m   Wt Readings from Last 3 Encounters:  08/26/24 130.6 kg (288 lb)  04/14/24 131.1 kg (289 lb)  08/12/23 (!) 140.2 kg (309 lb)   PHYSICAL EXAM: General:  well appearing.  No respiratory difficulty. Walked into clinic Neck: JVD flat.  Cor: Regular rate & rhythm. No murmurs. Lungs: clear Extremities: trace LLE  edema   Neuro: alert & oriented x 3. Affect pleasant.   EKG: n/a  ASSESSMENT & PLAN: 1. 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 This is stable dating back to 2017 - RHC (12/18): with very mild PAH and PVR 3.1 WU - Echo (10/19):  EF 55-60% RV not well seen but looks ok No septal flattening  - Echo (9/20): EF 65% RV dilated with normal function. Septum flat.  - RHC (10/20): mild PAH 50/16 (28) with high output (8.1l/min) and normal PVR (2.0 WU). Not candidate for additional selective pulmonary artery vasodilators. - RHC (11/21): PA 52/18 (30) PCW 12 PVR 2.8 WU - RHC (8/23): RA 10, PA 51/22 (32), PCW 14, PVR 2.6 WU - Echo 5/24 EF 60-65%, RVSFx normal, RVSP 24 mmHg  - (10/29/22) 237.7 meters; 95-99% on room air. - Worse today NYHA III Volume ok  - Continue torsemide  20 mg daily + KCL to 20 mEq daily.  - Continue spiro 25mg  daily - Continue Adempas  2 mg tid and Xarelto .  - Given worsening symptoms despite weight loss will repeat echo and RHC with eye toward increasing PAH therapies  2. Chronic chest pain  - Has chronic PE s/p thrombectomy, LHC performed at [facility] 7/17 with no CAD.  - Resolved  3. Palpitations - Zio (2/23): 5 runs SVT. Rare PVCS - No change  4. Fibromyalgia/Rheumatoid Arthritis  - Follows with Rheumatology.  - No change  5. Morbid obesity  - Body mass index is 51.02 kg/m. - Continue Ozempic   6. Nocturnal hypoxemia - Followed by Dr. Shellia - Sleep study with AHI 4.4. Nocturnal hypoxemia noted - Now off CPAP. Wearing O2 at night  7. Depression/anxiety - Receiving outpatient therapy.   - Per PCP   Toribio Fuel, MD  2:46 PM

## 2024-08-26 NOTE — Patient Instructions (Signed)
 Medication Changes:  No Changes In Medications at this time.   Lab Work:  Labs done today, your results will be available in MyChart, we will contact you for abnormal readings.  Testing/Procedures:  ECHOCARDIOGRAM AS SCHEDULED   Special Instructions // Education:   [facility] [address] [place] [address] Dept: [phone] Loc: [phone]  Lyndsi Altic  08/26/2024  You are scheduled for a Cardiac Catheterization on Wednesday, December 24 with Dr. Toribio Fuel.  1. Please arrive at the [facility] (Main Entrance A) at [facility]: [address] [place], [place] [address] at 6:30 AM (This time is 2 hour(s) before your procedure to ensure your preparation).   Free valet parking service is available. You will check in at ADMITTING. The support person will be asked to wait in the waiting room.  It is OK to have someone drop you off and come back when you are ready to be discharged.    Special note: Every effort is made to have your procedure done on time. Please understand that emergencies sometimes delay scheduled procedures.  2. Diet: NPO: Nothing to eat OR drink after midnight. (For TEE and Cath the same day)  3. Hydration: Nothing to eat and drink after midnight.  4. Labs: You will need to have blood drawn on TODAY   5. Medication instructions in preparation for your procedure:   Contrast Allergy: No  DO NOT TAKE XARELTO  THE NIGHT BEFORE PROCEDURE   DO NOT TAKE SPIRONOLACTONE  THE MORNING OF PROCEDURE   DO NOT TAKE TORSEMIDE  THE MORNING OF PROCEDURE   6. Plan to go home the same day, you will only stay overnight if medically necessary. 7. Bring a current list of your medications and current insurance cards. 8. You MUST have a responsible person to drive you home. 9. Someone MUST be with you the first 24 hours after you arrive home or your discharge will be  delayed. 10. Please wear clothes that are easy to get on and off and wear slip-on shoes.  Thank you for allowing us  to care for you!   -- [facility]   Follow-Up in: 4 MONTHS WITH DR. FUEL PLEASE CALL OUR OFFICE AROUND [place] TO GET SCHEDULED FOR YOUR APPOINTMENT. PHONE NUMBER IS [phone] OPTION 2   At the [facility], you and your health needs are our priority. We have a designated team specialized in the treatment of Heart Failure. This Care Team includes your primary Heart Failure Specialized Cardiologist (physician), Advanced Practice Providers (APPs- Physician Assistants and Nurse Practitioners), and Pharmacist who all work together to provide you with the care you need, when you need it.   You may see any of the following providers on your designated Care Team at your next follow up:  Dr. Toribio Fuel Dr. Ezra Shuck Dr. Odis Brownie Greig Mosses, NP Caffie Shed, [place] [facility] [place], [place] Beckey Coe, NP Jordan Lee, NP Tinnie Redman, PharmD   Please be sure to bring in all your medications bottles to every appointment.   Need to Contact Us :  If you have any questions or concerns before your next appointment please send us  a message through [place] or call our office at [phone].    TO LEAVE A MESSAGE FOR THE NURSE SELECT OPTION 2, PLEASE LEAVE A MESSAGE INCLUDING: YOUR NAME DATE OF BIRTH CALL BACK NUMBER REASON FOR CALL**this is important as we  prioritize the call backs  YOU WILL RECEIVE A CALL BACK THE SAME DAY AS LONG AS YOU CALL BEFORE 4:00 PM

## 2024-08-26 NOTE — Progress Notes (Signed)
 Orders placed for [facility] on 12/24 with Dr. Bensimhon.

## 2024-08-30 ENCOUNTER — Encounter (HOSPITAL_COMMUNITY): Payer: Self-pay

## 2024-08-30 ENCOUNTER — Ambulatory Visit (HOSPITAL_COMMUNITY)
Admission: RE | Admit: 2024-08-30 | Discharge: 2024-08-30 | Disposition: A | Discharge status: Home / Self Care | Source: Ambulatory Visit | Attending: Internal Medicine | Admitting: Internal Medicine

## 2024-08-30 ENCOUNTER — Other Ambulatory Visit (HOSPITAL_COMMUNITY): Payer: Self-pay

## 2024-08-30 DIAGNOSIS — I5030 Unspecified diastolic (congestive) heart failure: Secondary | ICD-10-CM | POA: Insufficient documentation

## 2024-08-30 LAB — BASIC METABOLIC PANEL WITH GFR
Anion gap: 11 (ref 5–15)
BUN: 12 mg/dL (ref 8–23)
CO2: 27 mmol/L (ref 22–32)
Calcium: 9.5 mg/dL (ref 8.9–10.3)
Chloride: 103 mmol/L (ref 98–111)
Creatinine, Ser: 0.98 mg/dL (ref 0.44–1.00)
GFR, Estimated: >60 mL/min
Glucose, Bld: 88 mg/dL (ref 70–99)
Potassium: 4.3 mmol/L (ref 3.5–5.1)
Sodium: 141 mmol/L (ref 135–145)

## 2024-08-30 LAB — CBC
HCT: 39.8 % (ref 36.0–46.0)
Hemoglobin: 12.5 g/dL (ref 12.0–15.0)
MCH: 21.4 pg — ABNORMAL LOW (ref 26.0–34.0)
MCHC: 31.4 g/dL (ref 30.0–36.0)
MCV: 68.3 fL — ABNORMAL LOW (ref 80.0–100.0)
Platelets: 374 K/uL (ref 150–400)
RBC: 5.83 MIL/uL — ABNORMAL HIGH (ref 3.87–5.11)
RDW: 18.4 % — ABNORMAL HIGH (ref 11.5–15.5)
WBC: 8.1 K/uL (ref 4.0–10.5)
nRBC: 0 % (ref 0.0–0.2)

## 2024-09-01 ENCOUNTER — Ambulatory Visit (HOSPITAL_COMMUNITY)
Admission: RE | Admit: 2024-09-01 | Discharge: 2024-09-01 | Disposition: A | Discharge status: Home / Self Care | Attending: Internal Medicine | Admitting: Internal Medicine

## 2024-09-01 ENCOUNTER — Encounter (HOSPITAL_COMMUNITY)
Admission: RE | Disposition: A | Discharge status: Home / Self Care | Payer: Self-pay | Source: Home / Self Care | Attending: Internal Medicine

## 2024-09-01 ENCOUNTER — Encounter (HOSPITAL_COMMUNITY): Payer: Self-pay | Admitting: Internal Medicine

## 2024-09-01 ENCOUNTER — Other Ambulatory Visit: Payer: Self-pay

## 2024-09-01 DIAGNOSIS — M069 Rheumatoid arthritis, unspecified: Secondary | ICD-10-CM | POA: Diagnosis not present

## 2024-09-01 DIAGNOSIS — I2721 Secondary pulmonary arterial hypertension: Secondary | ICD-10-CM | POA: Diagnosis not present

## 2024-09-01 DIAGNOSIS — F32A Depression, unspecified: Secondary | ICD-10-CM | POA: Insufficient documentation

## 2024-09-01 DIAGNOSIS — I272 Pulmonary hypertension, unspecified: Secondary | ICD-10-CM | POA: Diagnosis present

## 2024-09-01 DIAGNOSIS — F419 Anxiety disorder, unspecified: Secondary | ICD-10-CM | POA: Diagnosis not present

## 2024-09-01 DIAGNOSIS — I110 Hypertensive heart disease with heart failure: Secondary | ICD-10-CM | POA: Diagnosis not present

## 2024-09-01 DIAGNOSIS — Z79899 Other long term (current) drug therapy: Secondary | ICD-10-CM | POA: Insufficient documentation

## 2024-09-01 DIAGNOSIS — G894 Chronic pain syndrome: Secondary | ICD-10-CM | POA: Diagnosis not present

## 2024-09-01 DIAGNOSIS — Z7901 Long term (current) use of anticoagulants: Secondary | ICD-10-CM | POA: Insufficient documentation

## 2024-09-01 DIAGNOSIS — G4733 Obstructive sleep apnea (adult) (pediatric): Secondary | ICD-10-CM | POA: Diagnosis not present

## 2024-09-01 DIAGNOSIS — I5032 Chronic diastolic (congestive) heart failure: Secondary | ICD-10-CM | POA: Insufficient documentation

## 2024-09-01 DIAGNOSIS — M797 Fibromyalgia: Secondary | ICD-10-CM | POA: Diagnosis not present

## 2024-09-01 DIAGNOSIS — Z6841 Body Mass Index (BMI) 40.0 and over, adult: Secondary | ICD-10-CM | POA: Diagnosis not present

## 2024-09-01 DIAGNOSIS — R0902 Hypoxemia: Secondary | ICD-10-CM | POA: Diagnosis not present

## 2024-09-01 DIAGNOSIS — Z86711 Personal history of pulmonary embolism: Secondary | ICD-10-CM | POA: Insufficient documentation

## 2024-09-01 HISTORY — PX: RIGHT HEART CATH: CATH118263

## 2024-09-01 SURGERY — RIGHT HEART CATH
Anesthesia: LOCAL

## 2024-09-01 MED ORDER — FREE WATER: 250.0000 mL | Freq: Once | Status: DC

## 2024-09-01 MED ORDER — ONDANSETRON HCL 4 MG/2ML IJ SOLN: 4.0000 mg | Freq: Four times a day (QID) | INTRAMUSCULAR | Status: DC | PRN

## 2024-09-01 MED ORDER — ACETAMINOPHEN 325 MG PO TABS: 650.0000 mg | ORAL_TABLET | ORAL | Status: DC | PRN

## 2024-09-01 MED ORDER — SODIUM CHLORIDE 0.9% FLUSH: 3.0000 mL | INTRAVENOUS | Status: DC | PRN

## 2024-09-01 MED ORDER — MIDAZOLAM HCL 2 MG/2ML IJ SOLN
INTRAMUSCULAR | Status: AC
  Filled 2024-09-01: qty 2

## 2024-09-01 MED ORDER — LABETALOL HCL 5 MG/ML IV SOLN: 10.0000 mg | INTRAVENOUS | Status: DC | PRN

## 2024-09-01 MED ORDER — HEPARIN (PORCINE) IN NACL 1000-0.9 UT/500ML-% IV SOLN
INTRAVENOUS | Status: DC | PRN
  Administered 2024-09-01: 500 mL

## 2024-09-01 MED ORDER — SODIUM CHLORIDE 0.9 % IV SOLN: 250.0000 mL | INTRAVENOUS | Status: DC | PRN

## 2024-09-01 MED ORDER — LIDOCAINE HCL (PF) 1 % IJ SOLN
INTRAMUSCULAR | Status: DC | PRN
  Administered 2024-09-01: 5 mL via INTRADERMAL

## 2024-09-01 MED ORDER — HYDRALAZINE HCL 20 MG/ML IJ SOLN: 10.0000 mg | INTRAMUSCULAR | Status: DC | PRN

## 2024-09-01 MED ORDER — MIDAZOLAM HCL (PF) 2 MG/2ML IJ SOLN
INTRAMUSCULAR | Status: DC | PRN
  Administered 2024-09-01: 1 mg via INTRAVENOUS

## 2024-09-01 MED ORDER — SODIUM CHLORIDE 0.9% FLUSH: 3.0000 mL | Freq: Two times a day (BID) | INTRAVENOUS | Status: DC

## 2024-09-01 MED ORDER — LIDOCAINE HCL (PF) 1 % IJ SOLN
INTRAMUSCULAR | Status: AC
  Filled 2024-09-01: qty 30

## 2024-09-01 SURGICAL SUPPLY — 7 items
CATH SWAN GANZ 7F STRAIGHT (CATHETERS) IMPLANT
GLIDESHEATH SLENDER 7FR .021G (SHEATH) IMPLANT
GUIDEWIRE .025 260CM (WIRE) IMPLANT
KIT MICROPUNCTURE NIT STIFF (SHEATH) IMPLANT
PACK CARDIAC CATHETERIZATION (CUSTOM PROCEDURE TRAY) ×1 IMPLANT
TRANSDUCER W/STOPCOCK (MISCELLANEOUS) IMPLANT
TUBING ART PRESS 72 MALE/FEM (TUBING) IMPLANT

## 2024-09-01 NOTE — Progress Notes (Signed)
 Pt and husband received discharge instructions, teach back performed. Iv's removed, no complications. Rt brachial site is clean dry intact, Site is soft, no signs of bleeding. Pt escorted out via wheelchair to husband's vehicle.

## 2024-09-01 NOTE — Discharge Instructions (Signed)

## 2024-09-01 NOTE — Interval H&P Note (Signed)
 History and Physical Interval Note:  09/01/2024 9:00 AM  [name] [name]  has presented today for surgery, with the diagnosis of heart failure - hp.  The various methods of treatment have been discussed with the patient and family. After consideration of risks, benefits and other options for treatment, the patient has consented to  Procedures: RIGHT HEART CATH (N/A) as a surgical intervention.  The patient's history has been reviewed, patient examined, no change in status, stable for surgery.  I have reviewed the patient's chart and labs.  Questions were answered to the patient's satisfaction.     [name]

## 2024-09-03 LAB — POCT I-STAT EG7
Acid-Base Excess: 2 mmol/L (ref 0.0–2.0)
Bicarbonate: 27.4 mmol/L (ref 20.0–28.0)
Calcium, Ion: 1.29 mmol/L (ref 1.15–1.40)
HCT: 35 % — ABNORMAL LOW (ref 36.0–46.0)
Hemoglobin: 11.9 g/dL — ABNORMAL LOW (ref 12.0–15.0)
O2 Saturation: 65 %
Potassium: 4 mmol/L (ref 3.5–5.1)
Sodium: 143 mmol/L (ref 135–145)
TCO2: 29 mmol/L (ref 22–32)
pCO2, Ven: 44.4 mmHg (ref 44–60)
pH, Ven: 7.398 (ref 7.25–7.43)
pO2, Ven: 34 mmHg (ref 32–45)

## 2024-09-06 LAB — POCT I-STAT EG7
Acid-Base Excess: 2 mmol/L (ref 0.0–2.0)
Acid-Base Excess: 2 mmol/L (ref 0.0–2.0)
Bicarbonate: 26.9 mmol/L (ref 20.0–28.0)
Bicarbonate: 27.6 mmol/L (ref 20.0–28.0)
Calcium, Ion: 1.31 mmol/L (ref 1.15–1.40)
Calcium, Ion: 1.31 mmol/L (ref 1.15–1.40)
HCT: 36 % (ref 36.0–46.0)
HCT: 36 % (ref 36.0–46.0)
Hemoglobin: 12.2 g/dL (ref 12.0–15.0)
Hemoglobin: 12.2 g/dL (ref 12.0–15.0)
O2 Saturation: 63 %
O2 Saturation: 66 %
Potassium: 3.9 mmol/L (ref 3.5–5.1)
Potassium: 4.1 mmol/L (ref 3.5–5.1)
Sodium: 144 mmol/L (ref 135–145)
Sodium: 144 mmol/L (ref 135–145)
TCO2: 28 mmol/L (ref 22–32)
TCO2: 29 mmol/L (ref 22–32)
pCO2, Ven: 43.5 mmHg — ABNORMAL LOW (ref 44–60)
pCO2, Ven: 46.6 mmHg (ref 44–60)
pH, Ven: 7.38 (ref 7.25–7.43)
pH, Ven: 7.4 (ref 7.25–7.43)
pO2, Ven: 34 mmHg (ref 32–45)
pO2, Ven: 35 mmHg (ref 32–45)

## 2024-09-10 ENCOUNTER — Ambulatory Visit (HOSPITAL_COMMUNITY)
Admission: RE | Admit: 2024-09-10 | Discharge: 2024-09-10 | Disposition: A | Discharge status: Home / Self Care | Source: Ambulatory Visit | Attending: Internal Medicine | Admitting: Internal Medicine

## 2024-09-10 DIAGNOSIS — Z79899 Other long term (current) drug therapy: Secondary | ICD-10-CM | POA: Diagnosis present

## 2024-09-10 MED ORDER — HEPARIN SOD (PORK) LOCK FLUSH 100 UNIT/ML IV SOLN
500.0000 [IU] | INTRAVENOUS | Status: AC | PRN
  Administered 2024-09-10: 500 [IU]

## 2024-09-10 MED ORDER — SODIUM CHLORIDE 0.9% FLUSH
10.0000 mL | INTRAVENOUS | Status: AC | PRN
  Administered 2024-09-10: 10 mL

## 2024-09-10 NOTE — Progress Notes (Signed)
"   PATIENT CARE CENTER NOTE:   Diagnosis: Z79.899   Provider: Damian Christians FNP   Procedure: Port-a-cath flush ( # 6 of 11)     Note: Patient's PAC accessed using sterile technique.  Brisk blood return noted. Port flushed with 10 cc 0.9% NS and 500 units Heparin . PAC de-accessed and site covered with gauze dressing. Patient tolerated well. Patient declined printed AVS. Instructed patient to schedule monthly visit at front desk prior to leaving, verbalized understanding.  Alert, oriented and ambulatory with cane at discharge. "

## 2024-09-13 ENCOUNTER — Ambulatory Visit (HOSPITAL_COMMUNITY)
Admission: RE | Admit: 2024-09-13 | Discharge: 2024-09-13 | Disposition: A | Discharge status: Home / Self Care | Source: Ambulatory Visit | Attending: Internal Medicine | Admitting: Internal Medicine

## 2024-09-13 DIAGNOSIS — Z8673 Personal history of transient ischemic attack (TIA), and cerebral infarction without residual deficits: Secondary | ICD-10-CM | POA: Diagnosis not present

## 2024-09-13 DIAGNOSIS — I358 Other nonrheumatic aortic valve disorders: Secondary | ICD-10-CM | POA: Diagnosis not present

## 2024-09-13 DIAGNOSIS — I509 Heart failure, unspecified: Secondary | ICD-10-CM | POA: Insufficient documentation

## 2024-09-13 DIAGNOSIS — I5032 Chronic diastolic (congestive) heart failure: Secondary | ICD-10-CM | POA: Diagnosis not present

## 2024-09-13 DIAGNOSIS — I2721 Secondary pulmonary arterial hypertension: Secondary | ICD-10-CM | POA: Insufficient documentation

## 2024-09-13 DIAGNOSIS — G4730 Sleep apnea, unspecified: Secondary | ICD-10-CM | POA: Insufficient documentation

## 2024-09-13 DIAGNOSIS — I110 Hypertensive heart disease with heart failure: Secondary | ICD-10-CM | POA: Insufficient documentation

## 2024-09-13 LAB — ECHOCARDIOGRAM COMPLETE
AR max vel: 0.99 cm2
AV Area VTI: 1.02 cm2
AV Area mean vel: 1.02 cm2
AV Mean grad: 13 mmHg
AV Peak grad: 24.6 mmHg
Ao pk vel: 2.48 m/s
Area-P 1/2: 3.99 cm2
Calc EF: 68.4 %
S' Lateral: 2.7 cm
Single Plane A2C EF: 56.7 %
Single Plane A4C EF: 75.2 %

## 2024-09-13 MED ORDER — PERFLUTREN LIPID MICROSPHERE
1.0000 mL | INTRAVENOUS | Status: AC | PRN
  Administered 2024-09-13: 2 mL via INTRAVENOUS

## 2024-09-13 NOTE — Progress Notes (Signed)
" °  Echocardiogram 2D Echocardiogram has been performed.  [name] [name] 09/13/2024, 3:43 PM "

## 2024-10-11 ENCOUNTER — Ambulatory Visit (HOSPITAL_COMMUNITY)

## 2024-10-12 ENCOUNTER — Ambulatory Visit (HOSPITAL_COMMUNITY)

## 2024-10-18 ENCOUNTER — Ambulatory Visit (HOSPITAL_COMMUNITY)
